# Patient Record
Sex: Female | Born: 1940 | Race: White | Hispanic: No | Smoking: Never smoker
Health system: Southern US, Community
[De-identification: ages and names within clinical notes are randomized; demographics above are authoritative.]

## PROBLEM LIST (undated history)

## (undated) DIAGNOSIS — M797 Fibromyalgia: Secondary | ICD-10-CM

## (undated) DIAGNOSIS — R220 Localized swelling, mass and lump, head: Secondary | ICD-10-CM

## (undated) DIAGNOSIS — D649 Anemia, unspecified: Secondary | ICD-10-CM

## (undated) DIAGNOSIS — M722 Plantar fascial fibromatosis: Secondary | ICD-10-CM

## (undated) DIAGNOSIS — J40 Bronchitis, not specified as acute or chronic: Secondary | ICD-10-CM

## (undated) DIAGNOSIS — T7840XA Allergy, unspecified, initial encounter: Secondary | ICD-10-CM

## (undated) DIAGNOSIS — I872 Venous insufficiency (chronic) (peripheral): Secondary | ICD-10-CM

## (undated) DIAGNOSIS — R221 Localized swelling, mass and lump, neck: Secondary | ICD-10-CM

## (undated) DIAGNOSIS — K449 Diaphragmatic hernia without obstruction or gangrene: Secondary | ICD-10-CM

## (undated) DIAGNOSIS — M79609 Pain in unspecified limb: Secondary | ICD-10-CM

## (undated) DIAGNOSIS — K219 Gastro-esophageal reflux disease without esophagitis: Secondary | ICD-10-CM

## (undated) DIAGNOSIS — G9332 Myalgic encephalomyelitis/chronic fatigue syndrome: Secondary | ICD-10-CM

## (undated) DIAGNOSIS — M899 Disorder of bone, unspecified: Secondary | ICD-10-CM

## (undated) DIAGNOSIS — R21 Rash and other nonspecific skin eruption: Secondary | ICD-10-CM

## (undated) DIAGNOSIS — M949 Disorder of cartilage, unspecified: Secondary | ICD-10-CM

## (undated) DIAGNOSIS — Z8489 Family history of other specified conditions: Secondary | ICD-10-CM

## (undated) DIAGNOSIS — L509 Urticaria, unspecified: Secondary | ICD-10-CM

## (undated) DIAGNOSIS — J309 Allergic rhinitis, unspecified: Secondary | ICD-10-CM

## (undated) DIAGNOSIS — M76899 Other specified enthesopathies of unspecified lower limb, excluding foot: Secondary | ICD-10-CM

## (undated) DIAGNOSIS — E785 Hyperlipidemia, unspecified: Secondary | ICD-10-CM

## (undated) DIAGNOSIS — G43009 Migraine without aura, not intractable, without status migrainosus: Secondary | ICD-10-CM

## (undated) DIAGNOSIS — T8859XA Other complications of anesthesia, initial encounter: Secondary | ICD-10-CM

## (undated) DIAGNOSIS — M810 Age-related osteoporosis without current pathological fracture: Secondary | ICD-10-CM

## (undated) DIAGNOSIS — G43909 Migraine, unspecified, not intractable, without status migrainosus: Secondary | ICD-10-CM

## (undated) DIAGNOSIS — E039 Hypothyroidism, unspecified: Secondary | ICD-10-CM

## (undated) DIAGNOSIS — F411 Generalized anxiety disorder: Secondary | ICD-10-CM

## (undated) DIAGNOSIS — L989 Disorder of the skin and subcutaneous tissue, unspecified: Secondary | ICD-10-CM

## (undated) DIAGNOSIS — R5382 Chronic fatigue, unspecified: Secondary | ICD-10-CM

## (undated) DIAGNOSIS — M1990 Unspecified osteoarthritis, unspecified site: Secondary | ICD-10-CM

## (undated) DIAGNOSIS — J019 Acute sinusitis, unspecified: Secondary | ICD-10-CM

## (undated) DIAGNOSIS — N760 Acute vaginitis: Secondary | ICD-10-CM

## (undated) DIAGNOSIS — K589 Irritable bowel syndrome without diarrhea: Secondary | ICD-10-CM

## (undated) DIAGNOSIS — F3289 Other specified depressive episodes: Secondary | ICD-10-CM

## (undated) DIAGNOSIS — T4145XA Adverse effect of unspecified anesthetic, initial encounter: Secondary | ICD-10-CM

## (undated) DIAGNOSIS — F329 Major depressive disorder, single episode, unspecified: Secondary | ICD-10-CM

## (undated) HISTORY — DX: Myalgic encephalomyelitis/chronic fatigue syndrome: G93.32

## (undated) HISTORY — DX: Allergic rhinitis, unspecified: J30.9

## (undated) HISTORY — DX: Irritable bowel syndrome, unspecified: K58.9

## (undated) HISTORY — PX: VAGINAL HYSTERECTOMY: SUR661

## (undated) HISTORY — DX: Diaphragmatic hernia without obstruction or gangrene: K44.9

## (undated) HISTORY — DX: Other specified depressive episodes: F32.89

## (undated) HISTORY — DX: Fibromyalgia: M79.7

## (undated) HISTORY — PX: BREAST ENHANCEMENT SURGERY: SHX7

## (undated) HISTORY — DX: Plantar fascial fibromatosis: M72.2

## (undated) HISTORY — DX: Migraine without aura, not intractable, without status migrainosus: G43.009

## (undated) HISTORY — PX: APPENDECTOMY: SHX54

## (undated) HISTORY — DX: Allergy, unspecified, initial encounter: T78.40XA

## (undated) HISTORY — DX: Urticaria, unspecified: L50.9

## (undated) HISTORY — PX: SHOULDER SURGERY: SHX246

## (undated) HISTORY — DX: Age-related osteoporosis without current pathological fracture: M81.0

## (undated) HISTORY — DX: Hyperlipidemia, unspecified: E78.5

## (undated) HISTORY — DX: Venous insufficiency (chronic) (peripheral): I87.2

## (undated) HISTORY — DX: Localized swelling, mass and lump, head: R22.1

## (undated) HISTORY — PX: CHOLECYSTECTOMY: SHX55

## (undated) HISTORY — DX: Pain in unspecified limb: M79.609

## (undated) HISTORY — PX: OOPHORECTOMY: SHX86

## (undated) HISTORY — DX: Disorder of the skin and subcutaneous tissue, unspecified: L98.9

## (undated) HISTORY — DX: Acute sinusitis, unspecified: J01.90

## (undated) HISTORY — PX: COLONOSCOPY: SHX174

## (undated) HISTORY — DX: Gastro-esophageal reflux disease without esophagitis: K21.9

## (undated) HISTORY — PX: BREAST IMPLANT REMOVAL: SUR1101

## (undated) HISTORY — DX: Disorder of bone, unspecified: M89.9

## (undated) HISTORY — PX: OTHER SURGICAL HISTORY: SHX169

## (undated) HISTORY — DX: Localized swelling, mass and lump, head: R22.0

## (undated) HISTORY — DX: Unspecified osteoarthritis, unspecified site: M19.90

## (undated) HISTORY — DX: Generalized anxiety disorder: F41.1

## (undated) HISTORY — DX: Acute vaginitis: N76.0

## (undated) HISTORY — PX: MASTECTOMY: SHX3

## (undated) HISTORY — DX: Disorder of cartilage, unspecified: M94.9

## (undated) HISTORY — DX: Other specified enthesopathies of unspecified lower limb, excluding foot: M76.899

## (undated) HISTORY — DX: Migraine, unspecified, not intractable, without status migrainosus: G43.909

## (undated) HISTORY — PX: TONSILLECTOMY: SUR1361

## (undated) HISTORY — DX: Major depressive disorder, single episode, unspecified: F32.9

## (undated) HISTORY — DX: Chronic fatigue, unspecified: R53.82

## (undated) HISTORY — DX: Rash and other nonspecific skin eruption: R21

---

## 1998-03-18 ENCOUNTER — Other Ambulatory Visit: Admission: RE | Admit: 1998-03-18 | Discharge: 1998-04-12 | Payer: Self-pay

## 1998-06-14 ENCOUNTER — Other Ambulatory Visit: Admission: RE | Admit: 1998-06-14 | Discharge: 1998-06-14 | Payer: Self-pay | Admitting: Gastroenterology

## 1998-07-02 ENCOUNTER — Encounter: Payer: Self-pay | Admitting: Gastroenterology

## 1998-07-02 ENCOUNTER — Ambulatory Visit (HOSPITAL_COMMUNITY): Admission: RE | Admit: 1998-07-02 | Discharge: 1998-07-02 | Payer: Self-pay | Admitting: Gastroenterology

## 1998-09-27 ENCOUNTER — Encounter: Admission: RE | Admit: 1998-09-27 | Discharge: 1998-11-08 | Payer: Self-pay | Admitting: Orthopedic Surgery

## 1998-11-21 ENCOUNTER — Encounter: Admission: RE | Admit: 1998-11-21 | Discharge: 1998-12-06 | Payer: Self-pay | Admitting: Orthopaedic Surgery

## 1999-04-01 ENCOUNTER — Other Ambulatory Visit: Admission: RE | Admit: 1999-04-01 | Discharge: 1999-04-01 | Payer: Self-pay | Admitting: Family Medicine

## 2000-07-04 ENCOUNTER — Encounter (INDEPENDENT_AMBULATORY_CARE_PROVIDER_SITE_OTHER): Payer: Self-pay

## 2000-07-04 ENCOUNTER — Encounter: Payer: Self-pay | Admitting: General Surgery

## 2000-07-04 ENCOUNTER — Observation Stay (HOSPITAL_COMMUNITY): Admission: EM | Admit: 2000-07-04 | Discharge: 2000-07-05 | Payer: Self-pay | Admitting: Emergency Medicine

## 2000-09-15 ENCOUNTER — Other Ambulatory Visit: Admission: RE | Admit: 2000-09-15 | Discharge: 2000-09-15 | Payer: Self-pay | Admitting: Family Medicine

## 2002-06-16 ENCOUNTER — Encounter: Payer: Self-pay | Admitting: Emergency Medicine

## 2002-06-16 ENCOUNTER — Emergency Department (HOSPITAL_COMMUNITY): Admission: EM | Admit: 2002-06-16 | Discharge: 2002-06-17 | Payer: Self-pay | Admitting: Emergency Medicine

## 2002-06-17 ENCOUNTER — Encounter: Payer: Self-pay | Admitting: Orthopedic Surgery

## 2003-02-23 LAB — HM COLONOSCOPY

## 2003-08-28 ENCOUNTER — Ambulatory Visit (HOSPITAL_COMMUNITY): Admission: RE | Admit: 2003-08-28 | Discharge: 2003-08-28 | Payer: Self-pay | Admitting: Internal Medicine

## 2004-03-06 ENCOUNTER — Encounter: Admission: RE | Admit: 2004-03-06 | Discharge: 2004-03-06 | Payer: Self-pay | Admitting: Internal Medicine

## 2004-08-15 ENCOUNTER — Ambulatory Visit: Payer: Self-pay | Admitting: Gastroenterology

## 2004-08-19 ENCOUNTER — Ambulatory Visit: Payer: Self-pay | Admitting: Gastroenterology

## 2004-08-20 ENCOUNTER — Ambulatory Visit: Payer: Self-pay | Admitting: Gastroenterology

## 2004-08-21 ENCOUNTER — Encounter: Admission: RE | Admit: 2004-08-21 | Discharge: 2004-08-21 | Payer: Self-pay | Admitting: Gastroenterology

## 2005-01-06 ENCOUNTER — Ambulatory Visit: Payer: Self-pay | Admitting: Internal Medicine

## 2005-04-16 ENCOUNTER — Ambulatory Visit: Payer: Self-pay | Admitting: Internal Medicine

## 2005-04-29 ENCOUNTER — Ambulatory Visit: Payer: Self-pay | Admitting: Internal Medicine

## 2005-04-30 ENCOUNTER — Ambulatory Visit: Payer: Self-pay

## 2005-07-16 ENCOUNTER — Ambulatory Visit: Payer: Self-pay | Admitting: Internal Medicine

## 2005-12-26 ENCOUNTER — Ambulatory Visit: Payer: Self-pay | Admitting: Internal Medicine

## 2006-01-20 ENCOUNTER — Ambulatory Visit: Payer: Self-pay | Admitting: Internal Medicine

## 2006-01-29 ENCOUNTER — Ambulatory Visit: Payer: Self-pay | Admitting: Family Medicine

## 2006-04-28 ENCOUNTER — Ambulatory Visit: Payer: Self-pay | Admitting: Internal Medicine

## 2006-05-07 ENCOUNTER — Ambulatory Visit: Payer: Self-pay | Admitting: Internal Medicine

## 2006-08-05 ENCOUNTER — Ambulatory Visit: Payer: Self-pay | Admitting: Gastroenterology

## 2006-10-12 ENCOUNTER — Ambulatory Visit: Payer: Self-pay | Admitting: Internal Medicine

## 2006-11-16 ENCOUNTER — Ambulatory Visit: Payer: Self-pay | Admitting: Internal Medicine

## 2007-01-23 DIAGNOSIS — E785 Hyperlipidemia, unspecified: Secondary | ICD-10-CM | POA: Insufficient documentation

## 2007-01-23 DIAGNOSIS — K589 Irritable bowel syndrome without diarrhea: Secondary | ICD-10-CM | POA: Insufficient documentation

## 2007-01-23 DIAGNOSIS — J310 Chronic rhinitis: Secondary | ICD-10-CM | POA: Insufficient documentation

## 2007-01-23 DIAGNOSIS — K219 Gastro-esophageal reflux disease without esophagitis: Secondary | ICD-10-CM | POA: Insufficient documentation

## 2007-01-23 DIAGNOSIS — F329 Major depressive disorder, single episode, unspecified: Secondary | ICD-10-CM

## 2007-01-23 DIAGNOSIS — F419 Anxiety disorder, unspecified: Secondary | ICD-10-CM | POA: Insufficient documentation

## 2007-01-23 DIAGNOSIS — G43909 Migraine, unspecified, not intractable, without status migrainosus: Secondary | ICD-10-CM | POA: Insufficient documentation

## 2007-01-23 DIAGNOSIS — F32A Depression, unspecified: Secondary | ICD-10-CM | POA: Insufficient documentation

## 2007-01-23 DIAGNOSIS — M722 Plantar fascial fibromatosis: Secondary | ICD-10-CM | POA: Insufficient documentation

## 2007-01-25 ENCOUNTER — Ambulatory Visit: Payer: Self-pay | Admitting: Internal Medicine

## 2007-01-25 LAB — CONVERTED CEMR LAB
ALT: 24 units/L (ref 0–35)
AST: 28 units/L (ref 0–37)
Albumin: 3.9 g/dL (ref 3.5–5.2)
Alkaline Phosphatase: 47 units/L (ref 39–117)
BUN: 10 mg/dL (ref 6–23)
Basophils Absolute: 0 10*3/uL (ref 0.0–0.1)
Basophils Relative: 0.5 % (ref 0.0–1.0)
Bilirubin Urine: NEGATIVE
Bilirubin, Direct: 0.1 mg/dL (ref 0.0–0.3)
CO2: 33 meq/L — ABNORMAL HIGH (ref 19–32)
Calcium: 9.4 mg/dL (ref 8.4–10.5)
Chloride: 100 meq/L (ref 96–112)
Cholesterol: 187 mg/dL (ref 0–200)
Creatinine, Ser: 0.8 mg/dL (ref 0.4–1.2)
Eosinophils Absolute: 0.1 10*3/uL (ref 0.0–0.6)
Eosinophils Relative: 1.2 % (ref 0.0–5.0)
GFR calc Af Amer: 92 mL/min
GFR calc non Af Amer: 76 mL/min
Glucose, Bld: 102 mg/dL — ABNORMAL HIGH (ref 70–99)
HCT: 39.5 % (ref 36.0–46.0)
HDL: 75.7 mg/dL (ref >39.0)
Hemoglobin, Urine: NEGATIVE
Hemoglobin: 13.5 g/dL (ref 12.0–15.0)
Ketones, ur: NEGATIVE mg/dL
LDL Cholesterol: 81 mg/dL (ref 0–99)
Leukocytes, UA: NEGATIVE
Lymphocytes Relative: 27.2 % (ref 12.0–46.0)
MCHC: 34.1 g/dL (ref 30.0–36.0)
MCV: 94.9 fL (ref 78.0–100.0)
Monocytes Absolute: 0.9 10*3/uL — ABNORMAL HIGH (ref 0.2–0.7)
Monocytes Relative: 11 % (ref 3.0–11.0)
Neutro Abs: 5 10*3/uL (ref 1.4–7.7)
Neutrophils Relative %: 60.1 % (ref 43.0–77.0)
Nitrite: NEGATIVE
Platelets: 259 10*3/uL (ref 150–400)
Potassium: 3.7 meq/L (ref 3.5–5.1)
RBC: 4.16 M/uL (ref 3.87–5.11)
RDW: 12.1 % (ref 11.5–14.6)
Sodium: 141 meq/L (ref 135–145)
Specific Gravity, Urine: 1.01 (ref 1.000–1.03)
TSH: 1.59 microintl units/mL (ref 0.35–5.50)
Total Bilirubin: 0.6 mg/dL (ref 0.3–1.2)
Total CHOL/HDL Ratio: 2.5
Total Protein, Urine: NEGATIVE mg/dL
Total Protein: 7.4 g/dL (ref 6.0–8.3)
Triglycerides: 152 mg/dL — ABNORMAL HIGH (ref 0–149)
Urine Glucose: NEGATIVE mg/dL
Urobilinogen, UA: 0.2 (ref 0.0–1.0)
VLDL: 30 mg/dL (ref 0–40)
WBC: 8.3 10*3/uL (ref 4.5–10.5)
pH: 6 (ref 5.0–8.0)

## 2007-07-19 ENCOUNTER — Ambulatory Visit: Payer: Self-pay | Admitting: Internal Medicine

## 2007-07-19 DIAGNOSIS — M810 Age-related osteoporosis without current pathological fracture: Secondary | ICD-10-CM | POA: Insufficient documentation

## 2007-07-19 DIAGNOSIS — M76899 Other specified enthesopathies of unspecified lower limb, excluding foot: Secondary | ICD-10-CM | POA: Insufficient documentation

## 2007-07-19 DIAGNOSIS — J019 Acute sinusitis, unspecified: Secondary | ICD-10-CM | POA: Insufficient documentation

## 2007-08-17 ENCOUNTER — Encounter: Payer: Self-pay | Admitting: Internal Medicine

## 2008-03-20 ENCOUNTER — Ambulatory Visit: Payer: Self-pay | Admitting: Internal Medicine

## 2008-03-20 DIAGNOSIS — N760 Acute vaginitis: Secondary | ICD-10-CM | POA: Insufficient documentation

## 2008-03-20 DIAGNOSIS — R21 Rash and other nonspecific skin eruption: Secondary | ICD-10-CM | POA: Insufficient documentation

## 2008-03-20 LAB — CONVERTED CEMR LAB
ALT: 22 units/L (ref 0–35)
AST: 23 units/L (ref 0–37)
Albumin: 3.6 g/dL (ref 3.5–5.2)
Alkaline Phosphatase: 37 units/L — ABNORMAL LOW (ref 39–117)
BUN: 7 mg/dL (ref 6–23)
Basophils Absolute: 0 10*3/uL (ref 0.0–0.1)
Basophils Relative: 0.2 % (ref 0.0–3.0)
Bilirubin Urine: NEGATIVE
Bilirubin, Direct: 0.1 mg/dL (ref 0.0–0.3)
CO2: 30 meq/L (ref 19–32)
Calcium: 9 mg/dL (ref 8.4–10.5)
Chloride: 105 meq/L (ref 96–112)
Cholesterol: 194 mg/dL (ref 0–200)
Creatinine, Ser: 0.8 mg/dL (ref 0.4–1.2)
Eosinophils Absolute: 0.2 10*3/uL (ref 0.0–0.7)
Eosinophils Relative: 1.6 % (ref 0.0–5.0)
GFR calc Af Amer: 92 mL/min
GFR calc non Af Amer: 76 mL/min
Glucose, Bld: 98 mg/dL (ref 70–99)
HCT: 36.3 % (ref 36.0–46.0)
HDL: 81.8 mg/dL (ref >39.0)
Hemoglobin, Urine: NEGATIVE
Hemoglobin: 13 g/dL (ref 12.0–15.0)
Ketones, ur: NEGATIVE mg/dL
LDL Cholesterol: 83 mg/dL (ref 0–99)
Leukocytes, UA: NEGATIVE
Lymphocytes Relative: 24.4 % (ref 12.0–46.0)
MCHC: 35.8 g/dL (ref 30.0–36.0)
MCV: 95 fL (ref 78.0–100.0)
Monocytes Absolute: 0.8 10*3/uL (ref 0.1–1.0)
Monocytes Relative: 8.8 % (ref 3.0–12.0)
Neutro Abs: 6.3 10*3/uL (ref 1.4–7.7)
Neutrophils Relative %: 65 % (ref 43.0–77.0)
Nitrite: NEGATIVE
Platelets: 210 10*3/uL (ref 150–400)
Potassium: 3.9 meq/L (ref 3.5–5.1)
RBC: 3.82 M/uL — ABNORMAL LOW (ref 3.87–5.11)
RDW: 12.2 % (ref 11.5–14.6)
Sodium: 141 meq/L (ref 135–145)
Specific Gravity, Urine: 1.015 (ref 1.000–1.03)
TSH: 3.71 microintl units/mL (ref 0.35–5.50)
Total Bilirubin: 0.6 mg/dL (ref 0.3–1.2)
Total CHOL/HDL Ratio: 2.4
Total Protein, Urine: NEGATIVE mg/dL
Total Protein: 7.2 g/dL (ref 6.0–8.3)
Triglycerides: 148 mg/dL (ref 0–149)
Urine Glucose: NEGATIVE mg/dL
Urobilinogen, UA: 0.2 (ref 0.0–1.0)
VLDL: 30 mg/dL (ref 0–40)
WBC: 9.6 10*3/uL (ref 4.5–10.5)
pH: 6 (ref 5.0–8.0)

## 2008-03-20 LAB — HM MAMMOGRAPHY

## 2008-03-21 LAB — CONVERTED CEMR LAB: Vit D, 1,25-Dihydroxy: 45 (ref 30–89)

## 2008-04-02 ENCOUNTER — Ambulatory Visit: Payer: Self-pay | Admitting: Internal Medicine

## 2008-06-18 ENCOUNTER — Telehealth (INDEPENDENT_AMBULATORY_CARE_PROVIDER_SITE_OTHER): Payer: Self-pay | Admitting: *Deleted

## 2008-09-17 ENCOUNTER — Ambulatory Visit: Payer: Self-pay | Admitting: Internal Medicine

## 2009-03-20 ENCOUNTER — Telehealth: Payer: Self-pay | Admitting: Internal Medicine

## 2009-04-01 ENCOUNTER — Ambulatory Visit: Payer: Self-pay | Admitting: Internal Medicine

## 2009-04-01 DIAGNOSIS — R220 Localized swelling, mass and lump, head: Secondary | ICD-10-CM | POA: Insufficient documentation

## 2009-04-01 DIAGNOSIS — R221 Localized swelling, mass and lump, neck: Secondary | ICD-10-CM

## 2009-04-02 LAB — CONVERTED CEMR LAB: Vit D, 25-Hydroxy: 37 ng/mL (ref 30–89)

## 2009-04-03 LAB — CONVERTED CEMR LAB
ALT: 23 units/L (ref 0–35)
AST: 26 units/L (ref 0–37)
Albumin: 3.7 g/dL (ref 3.5–5.2)
Alkaline Phosphatase: 44 units/L (ref 39–117)
BUN: 8 mg/dL (ref 6–23)
Basophils Absolute: 0 10*3/uL (ref 0.0–0.1)
Basophils Relative: 0.1 % (ref 0.0–3.0)
Bilirubin Urine: NEGATIVE
Bilirubin, Direct: 0.1 mg/dL (ref 0.0–0.3)
CO2: 29 meq/L (ref 19–32)
Calcium: 8.6 mg/dL (ref 8.4–10.5)
Chloride: 105 meq/L (ref 96–112)
Cholesterol: 200 mg/dL (ref 0–200)
Creatinine, Ser: 0.8 mg/dL (ref 0.4–1.2)
Eosinophils Absolute: 0.2 10*3/uL (ref 0.0–0.7)
Eosinophils Relative: 1.9 % (ref 0.0–5.0)
GFR calc non Af Amer: 75.76 mL/min (ref >60)
Glucose, Bld: 99 mg/dL (ref 70–99)
HCT: 40.4 % (ref 36.0–46.0)
HDL: 82 mg/dL (ref >39.00)
Hemoglobin, Urine: NEGATIVE
Hemoglobin: 14 g/dL (ref 12.0–15.0)
Ketones, ur: NEGATIVE mg/dL
LDL Cholesterol: 99 mg/dL (ref 0–99)
Leukocytes, UA: NEGATIVE
Lymphocytes Relative: 23.1 % (ref 12.0–46.0)
Lymphs Abs: 2.1 10*3/uL (ref 0.7–4.0)
MCHC: 34.6 g/dL (ref 30.0–36.0)
MCV: 98.8 fL (ref 78.0–100.0)
Monocytes Absolute: 0.9 10*3/uL (ref 0.1–1.0)
Monocytes Relative: 10.2 % (ref 3.0–12.0)
Neutro Abs: 5.7 10*3/uL (ref 1.4–7.7)
Neutrophils Relative %: 64.7 % (ref 43.0–77.0)
Nitrite: NEGATIVE
Platelets: 223 10*3/uL (ref 150.0–400.0)
Potassium: 4.4 meq/L (ref 3.5–5.1)
RBC: 4.09 M/uL (ref 3.87–5.11)
RDW: 12.6 % (ref 11.5–14.6)
Sodium: 141 meq/L (ref 135–145)
Specific Gravity, Urine: 1.02 (ref 1.000–1.030)
TSH: 2.34 microintl units/mL (ref 0.35–5.50)
Total Bilirubin: 0.6 mg/dL (ref 0.3–1.2)
Total CHOL/HDL Ratio: 2
Total Protein, Urine: NEGATIVE mg/dL
Total Protein: 7.5 g/dL (ref 6.0–8.3)
Triglycerides: 97 mg/dL (ref 0.0–149.0)
Urine Glucose: NEGATIVE mg/dL
Urobilinogen, UA: 0.2 (ref 0.0–1.0)
VLDL: 19.4 mg/dL (ref 0.0–40.0)
WBC: 8.9 10*3/uL (ref 4.5–10.5)
pH: 5.5 (ref 5.0–8.0)

## 2009-07-30 ENCOUNTER — Telehealth: Payer: Self-pay | Admitting: Internal Medicine

## 2009-09-30 ENCOUNTER — Telehealth: Payer: Self-pay | Admitting: Internal Medicine

## 2010-02-12 ENCOUNTER — Encounter: Payer: Self-pay | Admitting: Internal Medicine

## 2010-04-02 ENCOUNTER — Encounter: Payer: Self-pay | Admitting: Internal Medicine

## 2010-04-02 ENCOUNTER — Ambulatory Visit: Payer: Self-pay | Admitting: Internal Medicine

## 2010-04-02 DIAGNOSIS — M949 Disorder of cartilage, unspecified: Secondary | ICD-10-CM

## 2010-04-02 DIAGNOSIS — G43009 Migraine without aura, not intractable, without status migrainosus: Secondary | ICD-10-CM | POA: Insufficient documentation

## 2010-04-02 DIAGNOSIS — I872 Venous insufficiency (chronic) (peripheral): Secondary | ICD-10-CM | POA: Insufficient documentation

## 2010-04-02 DIAGNOSIS — M899 Disorder of bone, unspecified: Secondary | ICD-10-CM | POA: Insufficient documentation

## 2010-04-02 DIAGNOSIS — M79609 Pain in unspecified limb: Secondary | ICD-10-CM | POA: Insufficient documentation

## 2010-04-02 DIAGNOSIS — L989 Disorder of the skin and subcutaneous tissue, unspecified: Secondary | ICD-10-CM | POA: Insufficient documentation

## 2010-04-02 LAB — CONVERTED CEMR LAB
ALT: 20 units/L (ref 0–35)
AST: 25 units/L (ref 0–37)
Albumin: 3.8 g/dL (ref 3.5–5.2)
Alkaline Phosphatase: 45 units/L (ref 39–117)
BUN: 11 mg/dL (ref 6–23)
Basophils Absolute: 0 10*3/uL (ref 0.0–0.1)
Basophils Relative: 0.3 % (ref 0.0–3.0)
Bilirubin Urine: NEGATIVE
Bilirubin, Direct: 0 mg/dL (ref 0.0–0.3)
Blood, UA: NEGATIVE
CO2: 30 meq/L (ref 19–32)
Calcium: 9.5 mg/dL (ref 8.4–10.5)
Chloride: 105 meq/L (ref 96–112)
Cholesterol: 200 mg/dL (ref 0–200)
Creatinine, Ser: 0.9 mg/dL (ref 0.4–1.2)
Eosinophils Absolute: 0.2 10*3/uL (ref 0.0–0.7)
Eosinophils Relative: 3 % (ref 0.0–5.0)
GFR calc non Af Amer: 69.48 mL/min (ref >60)
Glucose, Bld: 87 mg/dL (ref 70–99)
HCT: 39.2 % (ref 36.0–46.0)
HDL: 79.2 mg/dL (ref >39.00)
Hemoglobin: 13.5 g/dL (ref 12.0–15.0)
Ketones, ur: NEGATIVE mg/dL
LDL Cholesterol: 101 mg/dL — ABNORMAL HIGH (ref 0–99)
Leukocytes, UA: NEGATIVE
Lymphocytes Relative: 33 % (ref 12.0–46.0)
Lymphs Abs: 2.7 10*3/uL (ref 0.7–4.0)
MCHC: 34.4 g/dL (ref 30.0–36.0)
MCV: 96.2 fL (ref 78.0–100.0)
Monocytes Absolute: 1 10*3/uL (ref 0.1–1.0)
Monocytes Relative: 12.2 % — ABNORMAL HIGH (ref 3.0–12.0)
Neutro Abs: 4.3 10*3/uL (ref 1.4–7.7)
Neutrophils Relative %: 51.5 % (ref 43.0–77.0)
Nitrite: NEGATIVE
Platelets: 257 10*3/uL (ref 150.0–400.0)
Potassium: 4.3 meq/L (ref 3.5–5.1)
RBC: 4.07 M/uL (ref 3.87–5.11)
RDW: 13.3 % (ref 11.5–14.6)
Sodium: 141 meq/L (ref 135–145)
Specific Gravity, Urine: 1.01 (ref 1.000–1.030)
TSH: 2.03 microintl units/mL (ref 0.35–5.50)
Total Bilirubin: 0.4 mg/dL (ref 0.3–1.2)
Total CHOL/HDL Ratio: 3
Total Protein, Urine: NEGATIVE mg/dL
Total Protein: 7.3 g/dL (ref 6.0–8.3)
Triglycerides: 97 mg/dL (ref 0.0–149.0)
Urine Glucose: NEGATIVE mg/dL
Urobilinogen, UA: 0.2 (ref 0.0–1.0)
VLDL: 19.4 mg/dL (ref 0.0–40.0)
WBC: 8.3 10*3/uL (ref 4.5–10.5)
pH: 5.5 (ref 5.0–8.0)

## 2010-04-08 ENCOUNTER — Ambulatory Visit: Payer: Self-pay | Admitting: Internal Medicine

## 2010-04-14 ENCOUNTER — Encounter: Admission: RE | Admit: 2010-04-14 | Discharge: 2010-04-14 | Payer: Self-pay | Admitting: Orthopedic Surgery

## 2010-04-17 ENCOUNTER — Encounter: Payer: Self-pay | Admitting: Internal Medicine

## 2010-04-22 ENCOUNTER — Encounter: Payer: Self-pay | Admitting: Internal Medicine

## 2010-05-13 ENCOUNTER — Encounter: Payer: Self-pay | Admitting: Internal Medicine

## 2010-07-03 NOTE — Letter (Signed)
Summary: [facility]   Imported By: Lennie Odor 04/30/2010 15:27:15  _____________________________________________________________________  External Attachment:    Type:   Image     Comment:   External Document

## 2010-07-03 NOTE — Consult Note (Signed)
Summary: [facility]   Imported By: Sherian Rein 05/19/2010 11:48:16  _____________________________________________________________________  External Attachment:    Type:   Image     Comment:   External Document

## 2010-07-03 NOTE — Assessment & Plan Note (Signed)
Summary: YEARLY---STC   Vital Signs:  Patient profile:   70 year old female Height:      61 inches Weight:      150.38 pounds BMI:     28.52 O2 Sat:      98 % on Room air Temp:     97.7 degrees F oral Pulse rate:   79 / minute BP sitting:   132 / 82  (left arm) Cuff size:   regular  Vitals Entered By: Zella Ball Ewing CMA Duncan Dull) (April 02, 2010 10:41 AM)  O2 Flow:  Room air  CC: Yearly/RE   CC:  Yearly/RE.  History of Present Illness: here for wellness and f/u with multiple concerns; c/o 2-3 days ST with low grade fever, headache, general malaise adn weakness, slight cough but Pt denies CP, worsening sob, doe, wheezing, orthopnea, pnd, worsening LE edema, palps, dizziness or syncope   c/o over 4 wks moderate to severe left lateral hip and leg pain - worse to lay on the left side and hurts to walk, no falls or other injury  has occasional lower back achy pain off and on for over a yr, without LE pain/weak/numb, gait change, fever, wt change, bowel or bladder change, worse to get up from chair.   Pt denies new neuro symptoms such as headache, facial or extremity weakness   c/o 1 yr increased low mid abd disomfort new this yr but seems to occur concomitant with taking the torsemide, and cont's to have mild persistent venous type LE insuff and edema  requests lansoprazole change to protonix due to insurance only;  no dysphagia, n/v, abd pain, wt loss or blood.  hydrocodone makes her itch - no longer wants to take this, no rash, tongue swellling or sob/wheezing  has skin lesion to right ant thigh incresaed in size in the past yr - need derm referral  due for flu shot today; No fever, wt loss, night sweats, loss of appetite or other constitutional symptoms  Denies worsening depressive symptoms, suicidal ideation, or panic.  Pt denies polydipsia, polyuria.  Overall good compliance with meds, trying to follow low chol diet, wt stable, little excercise however  Pt states good ability  with ADL's, low fall risk, home safety reviewed and adequate, no significant change in hearing or vision, trying to follow lower chol diet, and occasionally active only with regular excercise.   Preventive Screening-Counseling & Management      Drug Use:  no.    Problems Prior to Update: 1)  Pharyngitis-acute  (ICD-462) 2)  Venous Insufficiency, Chronic  (ICD-459.81) 3)  Migraine, Common  (ICD-346.10) 4)  Bursitis, Left Hip  (ICD-726.5) 5)  Skin Lesion  (ICD-709.9) 6)  Osteopenia  (ICD-733.90) 7)  Foot Pain, Bilateral  (ICD-729.5) 8)  Neck Mass  (ICD-784.2) 9)  Sinusitis- Acute-nos  (ICD-461.9) 10)  Rash-nonvesicular  (ICD-782.1) 11)  Preventive Health Care  (ICD-V70.0) 12)  Vaginitis  (ICD-616.10) 13)  Bursitis, Right Hip  (ICD-726.5) 14)  Sinusitis- Acute-nos  (ICD-461.9) 15)  Osteoporosis  (ICD-733.00) 16)  Plantar Fasciitis  (ICD-728.71) 17)  Hyperlipidemia  (ICD-272.4) 18)  Depression  (ICD-311) 19)  Anxiety  (ICD-300.00) 20)  Allergic Rhinitis  (ICD-477.9) 21)  Gerd  (ICD-530.81) 22)  Migraine Headache  (ICD-346.90) 23)  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 Take 1 Tablet By Mouth Twice A Day As Needed - To Fill Sep 30, 2009 2)  Torsemide 20 Mg  Tabs (Torsemide) .Marland KitchenMarland KitchenMarland Kitchen  1 Tab Once Daily 3)  Estradiol 2 Mg  Tabs (Estradiol) .Marland Kitchen.. 1 By Mouth Once Daily 4)  Allegra 180 Mg  Tabs (Fexofenadine Hcl) .Marland Kitchen.. 1 By Mouth Once Daily 5)  Reglan 10 Mg  Tabs (Metoclopramide Hcl) .Marland Kitchen.. 1 By Mouth Three Times A Day 6)  Fosamax 70 Mg  Tabs (Alendronate Sodium) .Marland Kitchen.. 1 By Mouth Qwk 7)  Lansoprazole 30 Mg Cpdr (Lansoprazole) .Marland Kitchen.. 1 By Mouth Once Daily 8)  Wellbutrin Xl 300 Mg Xr24h-Tab (Bupropion Hcl) .Marland Kitchen.. 1 By Mouth Once Daily 9)  Sertraline Hcl 50 Mg Tabs (Sertraline Hcl) .Marland Kitchen.. 1 By Mouth Once Daily 10)  Hydrocodone-Acetaminophen 7.5-325 Mg Tabs (Hydrocodone-Acetaminophen) .Marland Kitchen.. 1po Once Daily As Needed 11)  Triamcinolone Acetonide 0.5 % Crea  (Triamcinolone Acetonide) .... Use Asd Two Times A Day As Needed 12)  Azithromycin 250 Mg Tabs (Azithromycin) .... 2po Qd For 1 Day, Then 1po Qd For 4days, Then Stop  Current Medications (verified): 1)  Alprazolam 0.5 Mg Tabs (Alprazolam) .... Take 1 Tablet By Mouth Twice A Day As Needed 2)  Estradiol 2 Mg  Tabs (Estradiol) .Marland Kitchen.. 1 By Mouth Once Daily 3)  Allegra 180 Mg  Tabs (Fexofenadine Hcl) .Marland Kitchen.. 1 By Mouth Once Daily - Generic 4)  Reglan 10 Mg  Tabs (Metoclopramide Hcl) .Marland Kitchen.. 1 By Mouth Three Times A Day As Needed 5)  Fosamax 70 Mg  Tabs (Alendronate Sodium) .Marland Kitchen.. 1 By Mouth Qwk 6)  Protonix 40 Mg Tbec (Pantoprazole Sodium) .Marland Kitchen.. 1 By Mouth Once Daily 7)  Wellbutrin Xl 300 Mg Xr24h-Tab (Bupropion Hcl) .Marland Kitchen.. 1 By Mouth Once Daily - Generic 8)  Sertraline Hcl 50 Mg Tabs (Sertraline Hcl) .Marland Kitchen.. 1 By Mouth Once Daily 9)  Triamcinolone Acetonide 0.5 % Crea (Triamcinolone Acetonide) .... Use Asd Two Times A Day As Needed 10)  Fioricet 50-325-40 Mg Tabs (Butalbital-Apap-Caffeine) .Marland Kitchen.. 1 By Mouth 3 Times Per Wk As Needed For Headache 11)  Maxzide-25 37.5-25 Mg Tabs (Triamterene-Hctz) .... Generic - 1/2 - 1 By Mouth Once Daily As Needed For Swelling 12)  Azithromycin 250 Mg Tabs (Azithromycin) .... 2po Qd For 1 Day, Then 1po Qd For 4days, Then Stop  Allergies (verified): 1)  ! Asa 2)  ! Vioxx 3)  ! Erythromycin 4)  ! * D. H. E. 5)  ! Prednisone 6)  ! Sulfa 7)  ! Imitrex 8)  ! * Lorcet 9)  ! Augmentin 10)  ! Zoloft 11)  ! * Remeron 12)  ! Doxycycline 13)  ! * Ritalin 14)  ! 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 Never Smoked Alcohol use-no Drug use-no  Risk Factors: Smoking Status: never (03/20/2008)  Past Medical History: IBS Migraines GERD Allergic  rhinitis Anxiety Depression Hyperlipidemia Plantar Fascitis Osteoporosis GI dysmotility d/o with gastroparesis/chronic constipation chronic headaches - on as needed hydrocodone Osteopenia venous insufficiency/chronic edema  Past Surgical History: Cholecystectomy Appendectomy Hysterectomy Mastectomy- bilateral for severe bilateral fibrocystic disease Oophorectomy R Shoulder Surgery S/P neck lump removal  Family History: Reviewed history from 03/20/2008 and no changes required. DM - sister and mother, and mult relative on father side heart disease grandmother - breast cancer  Social History: Reviewed history from 03/20/2008 and no changes required. 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         all otherwise negative per pt -  except also ongoing left foot pain, and did not seem better with tramadol after ortho visit, and cont's with approx 3 migraine per wk, with mutlple drug intolerances in the past - now apparently to hydrocodone as well  Physical Exam  General:  alert and overweight-appearing.   Head:  normocephalic and atraumatic.   Eyes:  vision grossly intact, pupils equal, and pupils round.   Ears:  bilat tm's red, sinus nontender bilat Nose:  no external deformity and no nasal discharge.   Mouth:  pharyngeal erythema and fair dentition.   Neck:  supple and cervical lymphadenopathy.   Lungs:  normal respiratory effort and normal breath sounds.   Heart:  normal  rate and regular rhythm.   Abdomen:  soft, non-tender, and normal bowel sounds.   Msk:  no acute joint tenderness and no joint swelling.;  has marked tender over left lateral greater trochanter;  left foot without erythema, tedner , swelling Extremities:  trace bilat edema, no erythema  Neurologic:  cranial nerves II-XII intact, strength normal in all extremities, sensation intact to light touch, gait normal, and DTRs symmetrical and normal.   Skin:  color normal and no rashes.  , lesion to right ant thigh noted - for referral Psych:  dysphoric affect and moderately anxious.     Impression & Recommendations:  Problem # 1:  Preventive Health Care (ICD-V70.0) Overall doing well, age appropriate education and counseling updated, referral for preventive services and immunizations addressed, dietary counseling and smoking status adressed , most recent labs reviewed, ecg reviewed I have personally reviewed and have noted 1.The patient's medical and social history 2.Their use of alcohol, tobacco or illicit drugs 3.Their current medications and supplements 4. Functional ability including ADL's, fall risk, home safety risk, hearing & visual impairment  5.Diet and physical activities 6.Evidence for depression or mood disorders The patients weight, height, BMI  have been recorded in the chart I have made referrals, counseling and provided education to the patient based review of the above  Orders: EKG w/ Interpretation (93000) TLB-BMP (Basic Metabolic Panel-BMET) (80048-METABOL) TLB-CBC Platelet - w/Differential (85025-CBCD) TLB-Hepatic/Liver Function Pnl (80076-HEPATIC) TLB-Lipid Panel (80061-LIPID) TLB-TSH (Thyroid Stimulating Hormone) (84443-TSH) TLB-Udip ONLY (81003-UDIP)  Problem # 2:  FOOT PAIN, BILATERAL (ICD-729.5)  exam ok , left more than right, refer to Dr Windy Carina  Orders: Orthopedic Surgeon Referral (Ortho Surgeon)  Problem # 3:  OSTEOPENIA (ICD-733.90)  Her updated  medication list for this problem includes:    Fosamax 70 Mg Tabs (Alendronate sodium) .Marland Kitchen... 1 by mouth qwk due for f/u dxa next month - pt to schedule , Continue all previous medications as before this visit ,  last dxa reviewed with pt  Orders: T-Bone Densitometry ([address])  Problem # 4:  SKIN LESION (ICD-709.9)  right ant thigh - for referral to derm  Orders: Dermatology Referral (Derma)  Problem # 5:  BURSITIS, LEFT HIP (ICD-726.5) moderate, Continue all previous medications as before this visit , refer ortho Orders: Orthopedic Surgeon Referral (Ortho Surgeon)  Problem # 6:  MIGRAINE, COMMON (ICD-346.10)  The following medications were removed from the medication list:  Hydrocodone-acetaminophen 7.5-325 Mg Tabs (Hydrocodone-acetaminophen) .Marland Kitchen... 1po once daily as needed Her updated medication list for this problem includes:    Fioricet 50-325-40 Mg Tabs (Butalbital-apap-caffeine) .Marland Kitchen... 1 by mouth 3 times per wk as needed for headache treat as above, f/u any worsening signs or symptoms   Problem # 7:  PHARYNGITIS-ACUTE (ICD-462)  The following medications were removed from the medication list:    Azithromycin 250 Mg Tabs (Azithromycin) .Marland Kitchen... 2po qd for 1 day, then 1po qd for 4days, then stop Her updated medication list for this problem includes:    Azithromycin 250 Mg Tabs (Azithromycin) .Marland Kitchen... 2po qd for 1 day, then 1po qd for 4days, then stop treat as above, f/u any worsening signs or symptoms   Problem # 8:  VENOUS INSUFFICIENCY, CHRONIC (ICD-459.81) tx with maxide 1/2 once daily   Complete Medication List: 1)  Alprazolam 0.5 Mg Tabs (Alprazolam) .... Take 1 tablet by mouth twice a day as needed 2)  Estradiol 2 Mg Tabs (Estradiol) .Marland Kitchen.. 1 by mouth once daily 3)  Allegra 180 Mg Tabs (Fexofenadine hcl) .Marland Kitchen.. 1 by mouth once daily - generic 4)  Reglan 10 Mg Tabs (Metoclopramide hcl) .Marland Kitchen.. 1 by mouth three times a day as needed 5)  Fosamax 70 Mg Tabs (Alendronate sodium) .Marland Kitchen.. 1  by mouth qwk 6)  Protonix 40 Mg Tbec (Pantoprazole sodium) .Marland Kitchen.. 1 by mouth once daily 7)  Wellbutrin Xl 300 Mg Xr24h-tab (Bupropion hcl) .Marland Kitchen.. 1 by mouth once daily - generic 8)  Sertraline Hcl 50 Mg Tabs (Sertraline hcl) .Marland Kitchen.. 1 by mouth once daily 9)  Triamcinolone Acetonide 0.5 % Crea (Triamcinolone acetonide) .... Use asd two times a day as needed 10)  Fioricet 50-325-40 Mg Tabs (Butalbital-apap-caffeine) .Marland Kitchen.. 1 by mouth 3 times per wk as needed for headache 11)  Maxzide-25 37.5-25 Mg Tabs (Triamterene-hctz) .... Generic - 1/2 - 1 by mouth once daily as needed for swelling 12)  Azithromycin 250 Mg Tabs (Azithromycin) .... 2po qd for 1 day, then 1po qd for 4days, then stop  Other Orders: Admin 1st Vaccine (62130) Flu Vaccine 37yrs + (86578)  Patient Instructions: 1)  You will be contacted about the referral(s) to: Dr Lennox Laity, and Dr Eulah Pont 2)  you had the flu shot today 3)  please schedule the bone density as you leave today 4)  OK to change the lansoprazole to protonix 40 mg per day 5)  you had the flu shot today 6)  You will be contacted about the referral(s) to: dermatology 7)  stop the torsemide 8)  start the generic maxide fluid pill as needed for the swelling 9)  Continue all previous medications as before this visit  10)  Please go to the Lab in the basement for your blood and/or urine tests today 11)  Please call the number on the [facility] Card for results of your testing  12)  Please schedule a follow-up appointment in 6 months. Prescriptions: AZITHROMYCIN 250 MG TABS (AZITHROMYCIN) 2po qd for 1 day, then 1po qd for 4days, then stop  #6 x 1   Entered and Authorized by:   Corwin Levins MD   Signed by:   Corwin Levins MD on 04/02/2010   Method used:   Electronically to        [facility] (retail)       [address]       [place], [place]  [address]       Ph: [phone] or [phone]  Fax: [phone]   RxID:   [id number] TRIAMCINOLONE ACETONIDE 0.5 % CREA  (TRIAMCINOLONE ACETONIDE) use asd two times a day as needed  #1 x 2   Entered and Authorized by:   Corwin Levins MD   Signed by:   Corwin Levins MD on 04/02/2010   Method used:   Electronically to        [facility] (retail)       [address]       [place], [place]  [address]       Ph: [phone] or [phone]       Fax: [phone]   RxID:   [id number] SERTRALINE HCL 50 MG TABS (SERTRALINE HCL) 1 by mouth once daily  #90 x 3   Entered and Authorized by:   Corwin Levins MD   Signed by:   Corwin Levins MD on 04/02/2010   Method used:   Electronically to        [facility] (retail)       [address]       [place], [place]  [address]       Ph: [phone] or [phone]       Fax: [phone]   RxID:   [id number] WELLBUTRIN XL 300 MG XR24H-TAB (BUPROPION HCL) 1 by mouth once daily - generic  #90 x 3   Entered and Authorized by:   Corwin Levins MD   Signed by:   Corwin Levins MD on 04/02/2010   Method used:   Electronically to        [facility] (retail)       [address]       [place], [place]  [address]       Ph: [phone] or [phone]       Fax: [phone]   RxID:   [id number] FOSAMAX 70 MG  TABS (ALENDRONATE SODIUM) 1 by mouth qwk  #12 x 3   Entered and Authorized by:   Corwin Levins MD   Signed by:   Corwin Levins MD on 04/02/2010   Method used:   Electronically to        [facility] (retail)       [address]       [place], [place]  [address]       Ph: [phone] or [phone]       Fax: [phone]   RxID:   [id number] REGLAN 10 MG  TABS (METOCLOPRAMIDE HCL) 1 by mouth three times a day as needed  #90 x 3   Entered and Authorized by:   Corwin Levins MD   Signed by:   Corwin Levins MD on 04/02/2010   Method used:   Electronically to        [facility] (retail)       [address]       [place], [place]  [address]       Ph: [phone] or [phone]       Fax: [phone]   RxID:   [id number] ALLEGRA 180 MG   TABS (FEXOFENADINE HCL) 1 by mouth once daily - generic  #90 x 3   Entered and Authorized by:   Corwin Levins MD   Signed by:   Corwin Levins MD on 04/02/2010   Method used:   Electronically to        [facility] (retail)  [address]       [place], [place]  [address]       Ph: [phone] or [phone]       Fax: [phone]   RxID:   [id number] ESTRADIOL 2 MG  TABS (ESTRADIOL) 1 by mouth once daily  #90 x 3   Entered and Authorized by:   Corwin Levins MD   Signed by:   Corwin Levins MD on 04/02/2010   Method used:   Electronically to        [facility] (retail)       [address]       [place], [place]  [address]       Ph: [phone] or [phone]       Fax: [phone]   RxID:   [id number] ALPRAZOLAM 0.5 MG TABS (ALPRAZOLAM) Take 1 tablet by mouth twice a day as needed  #60 x 5   Entered and Authorized by:   Corwin Levins MD   Signed by:   Corwin Levins MD on 04/02/2010   Method used:   Print then Give to Patient   RxID:   [id number] MAXZIDE-25 37.5-25 MG TABS (TRIAMTERENE-HCTZ) generic - 1/2 - 1 by mouth once daily as needed for swelling  #90 x 3   Entered and Authorized by:   Corwin Levins MD   Signed by:   Corwin Levins MD on 04/02/2010   Method used:   Electronically to        [facility] (retail)       [address]       [place], [place]  [address]       Ph: [phone] or [phone]       Fax: [phone]   RxID:   [id number] FIORICET 50-325-40 MG TABS [facility]) 1 by mouth 3 times per wk as needed for headache  #15 x 2   Entered and Authorized by:   Corwin Levins MD   Signed by:   Corwin Levins MD on 04/02/2010   Method used:   Print then Give to Patient   RxID:   [id number] PROTONIX 40 MG TBEC (PANTOPRAZOLE SODIUM) 1 by mouth once daily  #90 x 3   Entered and Authorized by:   Corwin Levins MD   Signed by:   Corwin Levins MD on 04/02/2010   Method used:   Electronically to        [facility]  (retail)       [address]       [place], [place]  [address]       Ph: [phone] or [phone]       Fax: [phone]   RxID:   [id number]    Orders Added: 1)  EKG w/ Interpretation [93000] 2)  Admin 1st Vaccine [90471] 3)  Flu Vaccine 21yrs + [419[address]] 4)  Orthopedic Surgeon Referral [Ortho Surgeon] 5)  T-Bone Densitometry [77080] 6)  Dermatology Referral [Derma] 7)  Orthopedic Surgeon Referral [Ortho Surgeon] 8)  TLB-BMP (Basic Metabolic Panel-BMET) [80048-METABOL] 9)  TLB-CBC Platelet - w/Differential [85025-CBCD] 10)  TLB-Hepatic/Liver Function Pnl [80076-HEPATIC] 11)  TLB-Lipid Panel [80061-LIPID] 12)  TLB-TSH (Thyroid Stimulating Hormone) [84443-TSH] 13)  TLB-Udip ONLY [81003-UDIP] 14)  Est. Patient 65& > [99397] 15)  Est. Patient Level IV [90240]   Flu Vaccine Consent Questions     Do you have a history of severe allergic reactions to this vaccine? no    Any prior history  of allergic reactions to egg and/or gelatin? no    Do you have a sensitivity to the preservative Thimersol? no    Do you have a past history of Guillan-Barre Syndrome? no    Do you currently have an acute febrile illness? no    Have you ever had a severe reaction to latex? no    Vaccine information given and explained to patient? yes    Are you currently pregnant? no    Lot Number:[id number]   Exp Date:11/29/2010   Site Given  Left Deltoid [id number]

## 2010-07-03 NOTE — Letter (Signed)
Summary: Generic Letter  [facility]  [address] [place], [place] [address]   Phone: [phone]  Fax: [phone]    08/17/2007  Laura Barnes [address] [place], [place]  [address]  Dear [name],        Please excuse from jury duty due to chronic  medical illness that makes her unable to fully  participate.      Sincerely,   Oliver Barre MD [facility]

## 2010-07-03 NOTE — Assessment & Plan Note (Signed)
Summary: SINUS INFECTION/NML   Vital Signs:  Patient Profile:   70 Years Old Female Weight:      137 pounds Temp:     97.2 degrees F oral Pulse rate:   90 / minute BP sitting:   136 / 88  (right arm) Cuff size:   regular  Pt. in pain?   no  Vitals Entered By: Rock Nephew CMA (July 19, 2007 3:26 PM)                  Preventive Care Screening  Bone Density:    Date:  02/03/2006    Next Due:  01/2008    Results:  abnormal std dev  Colonoscopy:    Next Due:  03/2013   Chief Complaint:  sinus congestion/sore throat/cough.  History of Present Illness: here with acute sinus pain, pressure, fever and greenish d/c and ST for several days, and ears blocked up; also with right lat hip tenderness and pain    Updated Prior Medication List: ALPRAZOLAM 0.5 MG TABS (ALPRAZOLAM) Take 1 tablet by mouth twice a day TORSEMIDE 20 MG  TABS (TORSEMIDE) 1 tab qd ESTRADIOL 2 MG  TABS (ESTRADIOL) 1 by mouth qd ALLEGRA 180 MG  TABS (FEXOFENADINE HCL) 1 by mouth qd REGLAN 10 MG  TABS (METOCLOPRAMIDE HCL) 1 by mouth three times a day FOSAMAX 70 MG  TABS (ALENDRONATE SODIUM) 1 by mouth qd PROTONIX 40 MG  PACK (PANTOPRAZOLE SODIUM) 1 by mouth qd LEVAQUIN 500 MG  TABS (LEVOFLOXACIN) 1po qd VICODIN 5-500 MG  TABS (HYDROCODONE-ACETAMINOPHEN) 1 by mouth once daily prn  Current Allergies (reviewed today): ! ASA ! VIOXX ! ERYTHROMYCIN ! * D. H. E. ! PREDNISONE ! ZITHROMAX ! SULFA ! IMITREX ! * LORCET ! AUGMENTIN ! ZOLOFT ! * REMERON ! DOXYCYCLINE ! * RITALIN ! NSAIDS  Past Medical History:    Reviewed history from 01/23/2007 and no changes required:       IBS       Migraines       GERD       Allergic rhinitis       Anxiety       Depression       Hyperlipidemia       Plantar Fascitis       Osteoporosis       GI dysmotility d/o with gastroparesis/chronic constipation  Past Surgical History:    Reviewed history from 01/23/2007 and no changes required:  Cholecystectomy       Appendectomy       Hysterectomy       Mastectomy- bilateral       Oophorectomy       R Shoulder Surgery       S/P neck lump removal   Social History:    Reviewed history and no changes required:       Married     Physical Exam  General:     mild ill Head:     Normocephalic and atraumatic without obvious abnormalities. No apparent alopecia or balding. Eyes:     No corneal or conjunctival inflammation noted. EOMI. Perrla.  Ears:     bilat tm's red, sinus tender bilat Nose:     External nasal examination shows no deformity or inflammation. Nasal mucosa are pink and moist without lesions or exudates. Mouth:     pharyngeal erythema.   Neck:     cervical lymphadenopathy.   Lungs:     Normal respiratory effort, chest expands symmetrically. Lungs are  clear to auscultation, no crackles or wheezes. Heart:     Normal rate and regular rhythm. S1 and S2 normal without gallop, murmur, click, rub or other extra sounds. Extremities:     no edema    Impression & Recommendations:  Problem # 1:  SINUSITIS- ACUTE-NOS (ICD-461.9)  Her updated medication list for this problem includes:    Levaquin 500 Mg Tabs (Levofloxacin) .Marland Kitchen... 1po qd tx as above  Problem # 2:  BURSITIS, RIGHT HIP (ICD-726.5) tx with vicodin as needed given the severity and other drug intolerances - also refer to ortho for further tx/eval Orders: Orthopedic Surgeon Referral (Ortho Surgeon)   Complete Medication List: 1)  Alprazolam 0.5 Mg Tabs (Alprazolam) .... Take 1 tablet by mouth twice a day 2)  Torsemide 20 Mg Tabs (Torsemide) .Marland Kitchen.. 1 tab qd 3)  Estradiol 2 Mg Tabs (Estradiol) .Marland Kitchen.. 1 by mouth qd 4)  Allegra 180 Mg Tabs (Fexofenadine hcl) .Marland Kitchen.. 1 by mouth qd 5)  Reglan 10 Mg Tabs (Metoclopramide hcl) .Marland Kitchen.. 1 by mouth three times a day 6)  Fosamax 70 Mg Tabs (Alendronate sodium) .Marland Kitchen.. 1 by mouth qd 7)  Protonix 40 Mg Pack (Pantoprazole sodium) .Marland Kitchen.. 1 by mouth qd 8)  Levaquin 500 Mg Tabs  (Levofloxacin) .Marland Kitchen.. 1po qd 9)  Vicodin 5-500 Mg Tabs (Hydrocodone-acetaminophen) .Marland Kitchen.. 1 by mouth once daily prn   Patient Instructions: 1)  take antibioitic as prescribed 2)  take the pain medication as needed for pain 3)  you will be contacted about the referral to orthopedic for the right hip 4)  Please schedule a follow-up appointment about august 2009    Prescriptions: VICODIN 5-500 MG  TABS (HYDROCODONE-ACETAMINOPHEN) 1 by mouth once daily prn  #40 x 1   Entered and Authorized by:   Corwin Levins MD   Signed by:   Corwin Levins MD on 07/19/2007   Method used:   Print then Give to Patient   RxID:   [id number] LEVAQUIN 500 MG  TABS (LEVOFLOXACIN) 1po qd  #10 x 0   Entered and Authorized by:   Corwin Levins MD   Signed by:   Corwin Levins MD on 07/19/2007   Method used:   Print then Give to Patient   RxID:   [id number]  ]

## 2010-07-03 NOTE — Progress Notes (Signed)
Summary: Med Refill  Phone Note Refill Request  on Sep 30, 2009 9:00 AM  Refills Requested: Medication #1:  ALPRAZOLAM 0.5 MG TABS Take 1 tablet by mouth twice a day as needed - to fill oct 20   Dosage confirmed as above?Dosage Confirmed   [facility] [address], [id number] Initial call taken by: Scharlene Gloss,  Sep 30, 2009 9:00 AM  Follow-up for Phone Call        Rx faxed to pharmacy Follow-up by: Margaret Pyle, CMA,  Sep 30, 2009 1:16 PM    New/Updated Medications: ALPRAZOLAM 0.5 MG TABS (ALPRAZOLAM) Take 1 tablet by mouth twice a day as needed - to fill Sep 30, 2009 Prescriptions: ALPRAZOLAM 0.5 MG TABS (ALPRAZOLAM) Take 1 tablet by mouth twice a day as needed - to fill Sep 30, 2009  #60 x 5   Entered and Authorized by:   Corwin Levins MD   Signed by:   Corwin Levins MD on 09/30/2009   Method used:   Print then Give to Patient   RxID:   [id number]  done hardcopy to LIM side B - dahlia  Corwin Levins MD  Sep 30, 2009 12:57 PM

## 2010-07-03 NOTE — Miscellaneous (Signed)
Summary: BONE DENSITY  Clinical Lists Changes  Orders: Added new Test order of T-Lumbar Vertebral Assessment (77082) - Signed 

## 2010-07-03 NOTE — Progress Notes (Signed)
  Phone Note Refill Request  on July 30, 2009 8:59 AM  Refills Requested: Medication #1:  HYDROCODONE-ACETAMINOPHEN 7.5-325 MG TABS 1po once daily as needed   Dosage confirmed as above?Dosage Confirmed   Last Refilled: 04/2009   Notes: [facility] [address] [phone] Initial call taken by: Scharlene Gloss,  July 30, 2009 9:00 AM  Follow-up for Phone Call        rx faxed to pharmacy Follow-up by: Margaret Pyle, CMA,  July 30, 2009 11:09 AM    Prescriptions: HYDROCODONE-ACETAMINOPHEN 7.5-325 MG TABS (HYDROCODONE-ACETAMINOPHEN) 1po once daily as needed  #40 x 1   Entered and Authorized by:   Corwin Levins MD   Signed by:   Corwin Levins MD on 07/30/2009   Method used:   Print then Give to Patient   RxID:   [id number]  done hardcopy to [facility] side B - dahlia  Corwin Levins MD  July 30, 2009 11:07 AM

## 2010-07-03 NOTE — Assessment & Plan Note (Signed)
Summary: YEARLY FU/[facility]MANA/LABS SAME DAY/$50 /NWS   Vital Signs:  Patient Profile:   70 Years Old Female Weight:      141 pounds Temp:     96.8 degrees F oral Pulse rate:   96 / minute BP sitting:   136 / 80  (left arm) Cuff size:   regular  Vitals Entered By: Payton Spark CMA (March 20, 2008 10:27 AM)                 Preventive Care Screening  Last Pneumovax:    Next Due:  01/2011  Mammogram:    Date:  03/20/2008    Results:  deferred   Chief Complaint:  CPX.  History of Present Illness: wants to take the wellbutrin increased from the 150 she is actually taking; here with acuet onset sinus symptoms over the past 2 day, alsos with recurrent headaches, usually post right occipital - pressure, severe , not throbbing and no visual problems and does have soud and light sensitivity ; used to go [facility] - but has been going there 40 yrs and dr Meryl Crutch has jsut retired; woulld consider in the future, more depressed lately with not wanting to get up the AM, not working anymore, has someone to clean the house; not much to look forward to; also with itchy rash to between the 4th and 5th fingers    Updated Prior Medication List: ALPRAZOLAM 0.5 MG TABS (ALPRAZOLAM) Take 1 tablet by mouth twice a day as needed - to fill Apr 01, 2008 TORSEMIDE 20 MG  TABS (TORSEMIDE) 1 tab once daily ESTRADIOL 2 MG  TABS (ESTRADIOL) 1 by mouth once daily ALLEGRA 180 MG  TABS (FEXOFENADINE HCL) 1 by mouth once daily REGLAN 10 MG  TABS (METOCLOPRAMIDE HCL) 1 by mouth three times a day FOSAMAX 70 MG  TABS (ALENDRONATE SODIUM) 1 by mouth qwk PROTONIX 40 MG  PACK (PANTOPRAZOLE SODIUM) 1 by mouth once daily WELLBUTRIN XL 300 MG XR24H-TAB (BUPROPION HCL) 1 by mouth once daily SERTRALINE HCL 50 MG TABS (SERTRALINE HCL) 1 by mouth once daily HYDROCODONE-ACETAMINOPHEN 7.5-325 MG TABS (HYDROCODONE-ACETAMINOPHEN) 1po once daily as needed TRIAMCINOLONE ACETONIDE 0.5 % CREA (TRIAMCINOLONE  ACETONIDE) use asd two times a day as needed LEVAQUIN 500 MG TABS (LEVOFLOXACIN) 1 by mouth once daily  Current Allergies (reviewed today): ! ASA ! VIOXX ! ERYTHROMYCIN ! * D. H. E. ! PREDNISONE ! ZITHROMAX ! SULFA ! IMITREX ! * LORCET ! AUGMENTIN ! ZOLOFT ! * REMERON ! DOXYCYCLINE ! * RITALIN ! NSAIDS  Past Medical History:    Reviewed history from 07/19/2007 and no changes required:       IBS       Migraines       GERD       Allergic rhinitis       Anxiety       Depression       Hyperlipidemia       Plantar Fascitis       Osteoporosis       GI dysmotility d/o with gastroparesis/chronic constipation  Past Surgical History:    Reviewed history from 01/23/2007 and no changes required:       Cholecystectomy       Appendectomy       Hysterectomy       Mastectomy- bilateral       Oophorectomy       R Shoulder Surgery       S/P neck lump removal  Family History:    Reviewed history and no changes required:       DM - sister and mother, and mult relative on father side       heart disease       grandmother - breast cancer  Social History:    Reviewed history from 07/19/2007 and no changes required:       Married       2 sons       retired - Audiological scientist       Never Smoked       Alcohol use-no   Risk Factors:  Tobacco use:  never Alcohol use:  no  Mammogram History:     Date of Last Mammogram:  03/20/2008    Results:  deferred   Review of Systems       The patient complains of depression.  The patient denies anorexia, fever, weight loss, weight gain, vision loss, decreased hearing, hoarseness, chest pain, syncope, dyspnea on exertion, peripheral edema, prolonged cough, headaches, hemoptysis, abdominal pain, melena, hematochezia, severe indigestion/heartburn, hematuria, incontinence, genital sores, muscle weakness, suspicious skin lesions, transient blindness, difficulty walking, unusual weight change, abnormal bleeding, enlarged lymph nodes, angioedema,  and breast masses.         all otherwise negative    Physical Exam  General:     alert and overweight-appearing - mild at best Head:     Normocephalic and atraumatic without obvious abnormalities. No apparent alopecia or balding. Eyes:     No corneal or conjunctival inflammation noted. EOMI. Perrla.  Ears:     bilat tm's red, sinus tender bilat Nose:     External nasal examination shows no deformity or inflammation. Nasal mucosa are pink and moist without lesions or exudates. Mouth:     pharyngeal erythema and fair dentition.   Neck:     No deformities, masses, or tenderness noted. Lungs:     Normal respiratory effort, chest expands symmetrically. Lungs are clear to auscultation, no crackles or wheezes. Heart:     Normal rate and regular rhythm. S1 and S2 normal without gallop, murmur, click, rub or other extra sounds. Abdomen:     Bowel sounds positive,abdomen soft and non-tender without masses, organomegaly or hernias noted. Msk:     no joint tenderness and no joint swelling.   Extremities:     no edema, no ulcers  Neurologic:     cranial nerves II-XII intact and strength normal in all extremities.   Skin:     mild erythema c/w exzema to skin between 4th and 5th fingers Psych:     depressed affect and slightly anxious.      Impression & Recommendations:  Problem # 1:  Preventive Health Care (ICD-V70.0) Overall doing well, up to date, counseled on routine health concerns for screening and prevention, immunizations up to date or declined, labs ordered, ecg reviewed, for flu shot today   Orders: EKG w/ Interpretation (93000) TLB-BMP (Basic Metabolic Panel-BMET) (80048-METABOL) TLB-CBC Platelet - w/Differential (85025-CBCD) TLB-Hepatic/Liver Function Pnl (80076-HEPATIC) TLB-Lipid Panel (80061-LIPID) TLB-TSH (Thyroid Stimulating Hormone) (84443-TSH) T-Vitamin D (25-Hydroxy) (16[address]-60454) TLB-Udip ONLY (81003-UDIP)   Problem # 2:  OSTEOPOROSIS (ICD-733.00)  Her  updated medication list for this problem includes:    Fosamax 70 Mg Tabs (Alendronate sodium) .Marland Kitchen... 1 by mouth qwk  Orders: T-Bone Densitometry (09811) for dxa as she is due  Problem # 3:  SINUSITIS- ACUTE-NOS (ICD-461.9)  The following medications were removed from the medication list:    Levaquin 500 Mg Tabs (  Levofloxacin) .Marland Kitchen... 1po qd  Her updated medication list for this problem includes:    Levaquin 500 Mg Tabs (Levofloxacin) .Marland Kitchen... 1 by mouth once daily treat as above, f/u any worsening signs or symptoms   Problem # 4:  DEPRESSION (ICD-311)  Her updated medication list for this problem includes:    Alprazolam 0.5 Mg Tabs (Alprazolam) .Marland Kitchen... Take 1 tablet by mouth twice a day as needed - to fill Apr 01, 2008    Wellbutrin Xl 300 Mg Xr24h-tab (Bupropion hcl) .Marland Kitchen... 1 by mouth once daily    Sertraline Hcl 50 Mg Tabs (Sertraline hcl) .Marland Kitchen... 1 by mouth once daily treat as above, f/u any worsening signs or symptoms  - higher dose wellbtrin, and add the sertraline, consider psychiatry referral if not improved in 3 to4 wks, or for any suicidal thoughts  Problem # 5:  RASH-NONVESICULAR (ICD-782.1) tx with steroid cream as needed  Her updated medication list for this problem includes:    Triamcinolone Acetonide 0.5 % Crea (Triamcinolone acetonide) ..... Use asd two times a day as needed   Complete Medication List: 1)  Alprazolam 0.5 Mg Tabs (Alprazolam) .... Take 1 tablet by mouth twice a day as needed - to fill Apr 01, 2008 2)  Torsemide 20 Mg Tabs (Torsemide) .Marland Kitchen.. 1 tab once daily 3)  Estradiol 2 Mg Tabs (Estradiol) .Marland Kitchen.. 1 by mouth once daily 4)  Allegra 180 Mg Tabs (Fexofenadine hcl) .Marland Kitchen.. 1 by mouth once daily 5)  Reglan 10 Mg Tabs (Metoclopramide hcl) .Marland Kitchen.. 1 by mouth three times a day 6)  Fosamax 70 Mg Tabs (Alendronate sodium) .Marland Kitchen.. 1 by mouth qwk 7)  Protonix 40 Mg Pack (Pantoprazole sodium) .Marland Kitchen.. 1 by mouth once daily 8)  Wellbutrin Xl 300 Mg Xr24h-tab (Bupropion hcl) .Marland Kitchen.. 1 by mouth  once daily 9)  Sertraline Hcl 50 Mg Tabs (Sertraline hcl) .Marland Kitchen.. 1 by mouth once daily 10)  Hydrocodone-acetaminophen 7.5-325 Mg Tabs (Hydrocodone-acetaminophen) .Marland Kitchen.. 1po once daily as needed 11)  Triamcinolone Acetonide 0.5 % Crea (Triamcinolone acetonide) .... Use asd two times a day as needed 12)  Levaquin 500 Mg Tabs (Levofloxacin) .Marland Kitchen.. 1 by mouth once daily  Other Orders: Influenza Vaccine NON MCR (62130)   Patient Instructions: 1)  you received the flu shot today 2)  increase the wellbutrin to 300 mg per day 3)  start the sertraline (generic zoloft) at 50 mg per day 4)  take the hydrocodone sparingly as needed for the headaches 5)  Please take all other new medications as prescribed - the antibiotics, and the cream 6)  Continue all medications that you may have been taking previously 7)  please schedule the bone density prior to leaving today 8)  Please go to the Lab in the basement for your blood tests today 9)  all prescriptions were sent to the pharmacy except for the alprazolam and the hydrocodone 10)  Please schedule a follow-up appointment in 6 months.   Prescriptions: PROTONIX 40 MG  PACK (PANTOPRAZOLE SODIUM) 1 by mouth once daily  #30 x 11   Entered and Authorized by:   Corwin Levins MD   Signed by:   Corwin Levins MD on 03/20/2008   Method used:   [facility]        [facility] [address]  [facility]       [address]       [place], [place]  [address]  Ph: [phone] or [phone]       Fax: [phone]   RxID:   [id number] FOSAMAX 70 MG  TABS (ALENDRONATE SODIUM) 1 by mouth qwk  #12 x 3   Entered and Authorized by:   Corwin Levins MD   Signed by:   Corwin Levins MD on 03/20/2008   Method used:   [facility]        [facility] [address]  [facility]       [address]       [place], [place]  [address]       Ph: [phone] or [phone]       Fax: [phone]   RxID:    [id number] REGLAN 10 MG  TABS (METOCLOPRAMIDE HCL) 1 by mouth three times a day  #90 x 11   Entered and Authorized by:   Corwin Levins MD   Signed by:   Corwin Levins MD on 03/20/2008   Method used:   [facility]        [facility] [address]  [facility]       [address]       [place], [place]  [address]       Ph: [phone] or [phone]       Fax: [phone]   RxID:   [id number] ALLEGRA 180 MG  TABS (FEXOFENADINE HCL) 1 by mouth once daily  #30 x 11   Entered and Authorized by:   Corwin Levins MD   Signed by:   Corwin Levins MD on 03/20/2008   Method used:   [facility]        [facility] [address]  [facility]       [address]       [place], [place]  [address]       Ph: [phone] or [phone]       Fax: [phone]   RxID:   [id number] ESTRADIOL 2 MG  TABS (ESTRADIOL) 1 by mouth once daily  #30 x 11   Entered and Authorized by:   Corwin Levins MD   Signed by:   Corwin Levins MD on 03/20/2008   Method used:   [facility]        [facility] [address]  [facility]       [address]       [place], [place]  [address]       Ph: [phone] or [phone]       Fax: [phone]   RxID:   [id number] TORSEMIDE 20 MG  TABS (TORSEMIDE) 1 tab once daily  #30 x 11   Entered and Authorized by:   Corwin Levins MD   Signed by:   Corwin Levins MD on 03/20/2008   Method used:   [facility]        [facility] [address]  [facility]       [address]       [place], [place]  [address]       Ph: [phone] or [phone]       Fax: [phone]   RxID:   [id number] ALPRAZOLAM 0.5 MG TABS (ALPRAZOLAM) Take  1 tablet by mouth twice a day as needed - to fill Apr 01, 2008  #60 x 5   Entered and Authorized by:   Corwin Levins MD   Signed by:   Corwin Levins MD on 03/20/2008   Method used:   Print then Give to Patient    RxID:   [id number] LEVAQUIN 500 MG TABS (LEVOFLOXACIN) 1 by mouth once daily  #1- x 0   Entered and Authorized by:   Corwin Levins MD   Signed by:   Corwin Levins MD on 03/20/2008   Method used:   [facility]        [facility] [address]  [facility]       [address]       [place], [place]  [address]       Ph: [phone] or [phone]       Fax: [phone]   RxID:   [id number] TRIAMCINOLONE ACETONIDE 0.5 % CREA (TRIAMCINOLONE ACETONIDE) use asd two times a day as needed  #1 x 2   Entered and Authorized by:   Corwin Levins MD   Signed by:   Corwin Levins MD on 03/20/2008   Method used:   [facility]        [facility] [address]  [facility]       [address]       [place], [place]  [address]       Ph: [phone] or [phone]       Fax: [phone]   RxID:   [id number] HYDROCODONE-ACETAMINOPHEN 7.5-325 MG TABS (HYDROCODONE-ACETAMINOPHEN) 1po once daily as needed  #40 x 1   Entered and Authorized by:   Corwin Levins MD   Signed by:   Corwin Levins MD on 03/20/2008   Method used:   Print then Give to Patient   RxID:   [id number] SERTRALINE HCL 50 MG TABS (SERTRALINE HCL) 1 by mouth once daily  #30 x 11   Entered and Authorized by:   Corwin Levins MD   Signed by:   Corwin Levins MD on 03/20/2008   Method used:   [facility] to        [facility] [address]  [facility]       [address]       [place], [place]  [address]       Ph: [phone] or [phone]       Fax: [phone]   RxID:   [id number] WELLBUTRIN XL 300 MG XR24H-TAB (BUPROPION HCL) 1 by mouth once daily  #30 x 11   Entered and Authorized by:   Corwin Levins MD   Signed by:   Corwin Levins MD on 03/20/2008   Method used:   [facility] to        [facility] [address]  [facility]       [address]       [place], [place]  [address]       Ph: [phone]  or [phone]       Fax: [phone]   RxID:   [id number]  ]  Influenza Vaccine    Vaccine Type: Fluvax Non-MCR    Site: left deltoid    Mfr:  GlaxoSmith[employer]line    Dose: 0.5 ml    Route: IM    Given by: Payton Spark CMA    Exp. Date: 11/28/2008    Lot #: [id number]    VIS given: 12/23/06 version given March 20, 2008.  Flu Vaccine Consent Questions    Do you have a history of severe allergic reactions to this vaccine? no    Any prior history of allergic reactions to egg and/or gelatin? no    Do you have a sensitivity to the preservative Thimersol? no    Do you have a past history of Guillan-Barre Syndrome? no    Do you currently have an acute febrile illness? no    Have you ever had a severe reaction to latex? no    Vaccine information given and explained to patient? yes    Are you currently pregnant? no

## 2010-07-03 NOTE — Progress Notes (Signed)
  Phone Note Call from Patient Call back at Home Phone [phone]   Caller: Patient/[phone] Call For: Corwin Levins MD Summary of Call: per Deon Pilling call insurance wont pay for the  Protonix pt need a new rx for omeprazole.Marland KitchenMarland KitchenJaxson Barnes is having heart burn also  [facility] [address]  [facility] [address] [place], [place]  [address] Ph: [phone] or [phone] Fax: [phone] Initial call taken by: Shelbie Proctor,  June 18, 2008 3:52 PM  Follow-up for Phone Call        ok to change to generic prilosec  - done escript Follow-up by: Corwin Levins MD,  June 20, 2008 3:18 PM  Additional Follow-up for Phone Call Additional follow up Details #1::        called pt to inform  pt made aware  Additional Follow-up by: Shelbie Proctor,  June 20, 2008 3:49 PM    New/Updated Medications: OMEPRAZOLE 20 MG TBEC (OMEPRAZOLE) 2 by mouth once daily   Prescriptions: OMEPRAZOLE 20 MG TBEC (OMEPRAZOLE) 2 by mouth once daily  #60 x 11   Entered and Authorized by:   Corwin Levins MD   Signed by:   Corwin Levins MD on 06/20/2008   Method used:   [facility]        [facility] [address]  [facility]       [address]       [place], [place]  [address]       Ph: [phone] or [phone]       Fax: [phone]   RxID:   [id number]

## 2010-07-03 NOTE — Miscellaneous (Signed)
Summary: Orders Update  Clinical Lists Changes  Orders: Added new Test order of T-Bone Densitometry (77080) - Signed Added new Test order of T-Lumbar Vertebral Assessment (77082) - Signed 

## 2010-07-03 NOTE — Assessment & Plan Note (Signed)
Summary: YEARLY FU/ LABS SAME DAY /NWS   Vital Signs:  Patient profile:   70 year old female Height:      61. inches Weight:      140 pounds BMI:     26.55 O2 Sat:      98 % on Room air Temp:     97 degrees F oral Pulse rate:   74 / minute BP sitting:   132 / 82  (left arm) Cuff size:   regular  Vitals Entered By: Zella Ball EWing  O2 Flow:  Room air  CC: yearly followup/RE   CC:  yearly followup/RE.  History of Present Illness: here with facial pain, pressure, fever adn greenish d/c for 3 days; o/w oding well , Pt denies CP, sob, doe, wheezing, orthopnea, pnd, worsening LE edema, palps, dizziness or syncope  Pt denies new neuro symptoms such as headache, facial or extremity weakness  Also with bialt earache.    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 Take 1 Tablet By Mouth Twice A Day As Needed - To Fill Mar 20, 2009 2)  Torsemide 20 Mg  Tabs (Torsemide) .Marland Kitchen.. 1 Tab Once Daily 3)  Estradiol 2 Mg  Tabs (Estradiol) .Marland Kitchen.. 1 By Mouth Once Daily 4)  Allegra 180 Mg  Tabs (Fexofenadine Hcl) .Marland Kitchen.. 1 By Mouth Once Daily 5)  Reglan 10 Mg  Tabs (Metoclopramide Hcl) .Marland Kitchen.. 1 By Mouth Three Times A Day 6)  Fosamax 70 Mg  Tabs (Alendronate Sodium) .Marland Kitchen.. 1 By Mouth Qwk 7)  Omeprazole 20 Mg Tbec (Omeprazole) .... 2 By Mouth Once Daily 8)  Wellbutrin Xl 300 Mg Xr24h-Tab (Bupropion Hcl) .Marland Kitchen.. 1 By Mouth Once Daily 9)   Sertraline Hcl 50 Mg Tabs (Sertraline Hcl) .Marland Kitchen.. 1 By Mouth Once Daily 10)  Hydrocodone-Acetaminophen 7.5-325 Mg Tabs (Hydrocodone-Acetaminophen) .Marland Kitchen.. 1po Once Daily As Needed 11)  Triamcinolone Acetonide 0.5 % Crea (Triamcinolone Acetonide) .... Use Asd Two Times A Day As Needed  Current Medications (verified): 1)  Alprazolam 0.5 Mg Tabs (Alprazolam) .... Take 1 Tablet By Mouth Twice A Day As Needed - To Fill Mar 20, 2009 2)  Torsemide 20 Mg  Tabs (Torsemide) .Marland Kitchen.. 1 Tab Once Daily 3)  Estradiol 2 Mg  Tabs (Estradiol) .Marland Kitchen.. 1 By Mouth Once Daily 4)  Allegra 180 Mg  Tabs (Fexofenadine Hcl) .Marland Kitchen.. 1 By Mouth Once Daily 5)  Reglan 10 Mg  Tabs (Metoclopramide Hcl) .Marland Kitchen.. 1 By Mouth Three Times A Day 6)  Fosamax 70 Mg  Tabs (Alendronate Sodium) .Marland Kitchen.. 1 By Mouth Qwk 7)  Lansoprazole 30 Mg Cpdr (Lansoprazole) .Marland Kitchen.. 1 By Mouth Once Daily 8)  Wellbutrin Xl 300 Mg Xr24h-Tab (Bupropion Hcl) .Marland Kitchen.. 1 By Mouth Once Daily 9)  Sertraline Hcl 50 Mg Tabs (Sertraline Hcl) .Marland Kitchen.. 1 By Mouth Once Daily 10)  Hydrocodone-Acetaminophen 7.5-325 Mg Tabs (Hydrocodone-Acetaminophen) .Marland Kitchen.. 1po Once Daily As Needed 11)  Triamcinolone Acetonide 0.5 % Crea (Triamcinolone  Acetonide) .... Use Asd Two Times A Day As Needed 12)  Azithromycin 250 Mg Tabs (Azithromycin) .... 2po Qd For 1 Day, Then 1po Qd For 4days, Then Stop  Allergies (verified): 1)  ! Asa 2)  ! Vioxx 3)  ! Erythromycin 4)  ! * D. H. E. 5)  ! Prednisone 6)  ! Sulfa 7)  ! Imitrex 8)  ! * Lorcet 9)  ! Augmentin 10)  ! Zoloft 11)  ! * Remeron 12)  ! Doxycycline 13)  ! * Ritalin 14)  ! Nsaids  Past History:  Past Surgical History: Last updated: 01/23/2007 Cholecystectomy Appendectomy Hysterectomy Mastectomy- bilateral Oophorectomy R Shoulder Surgery S/P neck lump removal  Family History: Last updated: 03/20/2008 DM - sister and mother, and mult relative on father side heart disease grandmother - breast cancer  Social History: Last updated:  03/20/2008 Married 2 sons retired - Audiological scientist Never Smoked Alcohol use-no  Risk Factors: Smoking Status: never (03/20/2008)  Past Medical History: IBS Migraines GERD Allergic rhinitis Anxiety Depression Hyperlipidemia Plantar Fascitis Osteoporosis GI dysmotility d/o with gastroparesis/chronic constipation chronic headaches - on as needed hydrocodone  Review of Systems  The patient denies anorexia, fever, weight loss, weight gain, vision loss, decreased hearing, hoarseness, chest pain, syncope, dyspnea on exertion, peripheral edema, prolonged cough, headaches, hemoptysis, abdominal pain, melena, hematochezia, severe indigestion/heartburn, hematuria, incontinence, muscle weakness, suspicious skin lesions, transient blindness, difficulty walking, depression, unusual weight change, abnormal bleeding, enlarged lymph nodes, and angioedema.         all otherwise negative per pt - except omeprazole seems to make her constipated; also with new lump to the neck near the left ear that is enlarged  Physical Exam  General:  alert and overweight-appearing.   Head:  normocephalic and atraumatic.   Eyes:  vision grossly intact, pupils equal, and pupils round.   Ears:  bilat tm's red, sinus tender bilat Nose:  no external deformity and no nasal discharge.   Mouth:  no gingival abnormalities and pharynx pink and moist.   Neck:  supple , subq firm mass noted below the angle of jaw , nontender and mobile, no skin change noted Lungs:  normal respiratory effort and normal breath sounds.   Heart:  normal rate and regular rhythm.   Abdomen:  soft, non-tender, and normal bowel sounds.   Msk:  no joint tenderness and no joint swelling.   Extremities:  no edema, no erythema  Neurologic:  cranial nerves II-XII intact and strength normal in all extremities.   Psych:  slightly anxious.     Impression & Recommendations:  Problem # 1:  Preventive Health Care (ICD-V70.0)  Overall doing well, up to  date, counseled on routine health concerns for screening and prevention, immunizations up to date or declined, labs reviewed, ecg reviewed  Orders: EKG w/ Interpretation (93000) TLB-BMP (Basic Metabolic Panel-BMET) (80048-METABOL) TLB-CBC Platelet - w/Differential (85025-CBCD) TLB-Hepatic/Liver Function Pnl (80076-HEPATIC) TLB-Lipid Panel (80061-LIPID) TLB-TSH (Thyroid Stimulating Hormone) (84443-TSH) TLB-Udip ONLY (81003-UDIP) T-Vitamin D (25-Hydroxy) (16109-60454)  Problem # 2:  GERD (ICD-530.81)  Her updated medication list for this problem includes:    Lansoprazole 30 Mg Cpdr (Lansoprazole) .Marland Kitchen... 1 by mouth once daily treat as above, f/u any worsening signs or symptoms - hopeful to not cause the constipatoin  Problem # 3:  NECK MASS (UJW-119.1)  refer ENT  Orders: ENT Referral (ENT)  Problem # 4:  SINUSITIS- ACUTE-NOS (ICD-461.9)  Her updated medication list for this problem includes:    Azithromycin 250 Mg Tabs (Azithromycin) .Marland KitchenMarland KitchenMarland KitchenMarland Kitchen  2po qd for 1 day, then 1po qd for 4days, then stop treat as above, f/u any worsening signs or symptoms   Complete Medication List: 1)  Alprazolam 0.5 Mg Tabs (Alprazolam) .... Take 1 tablet by mouth twice a day as needed - to fill Mar 20, 2009 2)  Torsemide 20 Mg Tabs (Torsemide) .Marland Kitchen.. 1 tab once daily 3)  Estradiol 2 Mg Tabs (Estradiol) .Marland Kitchen.. 1 by mouth once daily 4)  Allegra 180 Mg Tabs (Fexofenadine hcl) .Marland Kitchen.. 1 by mouth once daily 5)  Reglan 10 Mg Tabs (Metoclopramide hcl) .Marland Kitchen.. 1 by mouth three times a day 6)  Fosamax 70 Mg Tabs (Alendronate sodium) .Marland Kitchen.. 1 by mouth qwk 7)  Lansoprazole 30 Mg Cpdr (Lansoprazole) .Marland Kitchen.. 1 by mouth once daily 8)  Wellbutrin Xl 300 Mg Xr24h-tab (Bupropion hcl) .Marland Kitchen.. 1 by mouth once daily 9)  Sertraline Hcl 50 Mg Tabs (Sertraline hcl) .Marland Kitchen.. 1 by mouth once daily 10)  Hydrocodone-acetaminophen 7.5-325 Mg Tabs (Hydrocodone-acetaminophen) .Marland Kitchen.. 1po once daily as needed 11)  Triamcinolone Acetonide 0.5 % Crea  (Triamcinolone acetonide) .... Use asd two times a day as needed 12)  Azithromycin 250 Mg Tabs (Azithromycin) .... 2po qd for 1 day, then 1po qd for 4days, then stop  Other Orders: Flu Vaccine 64yrs + (16109) Administration Flu vaccine - MCR (U0454)  Patient Instructions: 1)  you had the flu shot today 2)  stop the omeprazole 3)  start the generic prevacid 4)  You will be contacted about the referral(s) to: ENTtake all new medications as prescribed - the antibiotic 5)  Continue all previous medications as before this visit  6)  Please go to the Lab in the basement for your blood and/or urine tests today 7)  Please schedule a follow-up appointment in 1 year or sooner if needed Prescriptions: HYDROCODONE-ACETAMINOPHEN 7.5-325 MG TABS (HYDROCODONE-ACETAMINOPHEN) 1po once daily as needed  #40 x 1   Entered and Authorized by:   Corwin Levins MD   Signed by:   Corwin Levins MD on 04/01/2009   Method used:   Print then Give to Patient   RxID:   [id number] AZITHROMYCIN 250 MG TABS (AZITHROMYCIN) 2po qd for 1 day, then 1po qd for 4days, then stop  #6 x 1   Entered and Authorized by:   Corwin Levins MD   Signed by:   Corwin Levins MD on 04/01/2009   Method used:   Print then Give to Patient   RxID:   [id number] TRIAMCINOLONE ACETONIDE 0.5 % CREA (TRIAMCINOLONE ACETONIDE) use asd two times a day as needed  #1 x 2   Entered and Authorized by:   Corwin Levins MD   Signed by:   Corwin Levins MD on 04/01/2009   Method used:   Print then Give to Patient   RxID:   [id number] LANSOPRAZOLE 30 MG CPDR (LANSOPRAZOLE) 1 by mouth once daily  #90 x 3   Entered and Authorized by:   Corwin Levins MD   Signed by:   Corwin Levins MD on 04/01/2009   Method used:   Print then Give to Patient   RxID:   [id number]    Flu Vaccine Consent Questions     Do you have a history of severe allergic reactions to this vaccine? no    Any prior history of allergic reactions to egg and/or gelatin? no     Do you have a sensitivity to the preservative Thimersol? no    Do you  have a past history of Guillan-Barre Syndrome? no    Do you currently have an acute febrile illness? no    Have you ever had a severe reaction to latex? no    Vaccine information given and explained to patient? yes    Are you currently pregnant? no    Lot Number:[id number]   Exp Date:11/28/2009   Site Given  Left Deltoid IMdflu

## 2010-07-03 NOTE — Assessment & Plan Note (Signed)
Summary: 6 mth fu/$50/jss   Vital Signs:  Patient profile:   70 year old female Height:      61 inches Weight:      142 pounds BMI:     26.93 O2 Sat:      98 % Temp:     96.8 degrees F oral Pulse rate:   79 / minute BP sitting:   118 / 68  (right arm) Cuff size:   regular  Vitals Entered By: Windell Norfolk (September 17, 2008 10:08 AM)  Preventive Care Screening  Bone Density:    Date:  04/02/2008    Next Due:  04/2010    Results:  abnormal std dev  CC: 6 month f/u   CC:  6 month f/u.  History of Present Illness: here with chronic headaches, biut also ? new mild sinusitis with facial pain, pressure, fever and greensih d/c for 3 days , on top of several weeks nasal allergy symptoms with clearish d/c; most recent dxa scan d/w pt - improved overall somewhtat from previous on the fosamax, excercise, and calcium.vit d supp  Problems Prior to Update: 1)  Sinusitis- Acute-nos  (ICD-461.9) 2)  Rash-nonvesicular  (ICD-782.1) 3)  Preventive Health Care  (ICD-V70.0) 4)  Vaginitis  (ICD-616.10) 5)  Bursitis, Right Hip  (ICD-726.5) 6)  Sinusitis- Acute-nos  (ICD-461.9) 7)  Osteoporosis  (ICD-733.00) 8)  Plantar Fasciitis  (ICD-728.71) 9)  Hyperlipidemia  (ICD-272.4) 10)  Depression  (ICD-311) 11)  Anxiety  (ICD-300.00) 12)  Allergic Rhinitis  (ICD-477.9) 13)  Gerd  (ICD-530.81) 14)  Migraine Headache  (ICD-346.90) 15)  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 Take 1 Tablet By Mouth Twice A Day As Needed - To Fill Apr 01, 2008 2)  Torsemide 20 Mg  Tabs (Torsemide) .Marland Kitchen.. 1 Tab Once Daily 3)  Estradiol 2 Mg  Tabs (Estradiol) .Marland Kitchen.. 1 By Mouth Once Daily 4)  Allegra 180 Mg  Tabs (Fexofenadine Hcl) .Marland Kitchen.. 1 By Mouth Once Daily 5)  Reglan 10 Mg  Tabs (Metoclopramide Hcl) .Marland Kitchen.. 1 By Mouth Three Times A Day 6)  Fosamax 70 Mg  Tabs (Alendronate Sodium) .Marland Kitchen.. 1 By Mouth Qwk 7)  Omeprazole 20 Mg Tbec (Omeprazole) .... 2 By Mouth Once Daily 8)   Wellbutrin Xl 300 Mg Xr24h-Tab (Bupropion Hcl) .Marland Kitchen.. 1 By Mouth Once Daily 9)  Sertraline Hcl 50 Mg Tabs (Sertraline Hcl) .Marland Kitchen.. 1 By Mouth Once Daily 10)  Hydrocodone-Acetaminophen 7.5-325 Mg Tabs (Hydrocodone-Acetaminophen) .Marland Kitchen.. 1po Once Daily As Needed 11)  Triamcinolone Acetonide 0.5 % Crea (Triamcinolone Acetonide) .... Use Asd Two Times A Day As Needed 12)  Levaquin 500 Mg Tabs (Levofloxacin) .Marland Kitchen.. 1 By Mouth Once Daily  Current Medications (verified): 1)  Alprazolam 0.5 Mg Tabs (Alprazolam) .... Take 1 Tablet By Mouth Twice A Day As Needed - To Fill Sep 17, 2008 2)  Torsemide 20 Mg  Tabs (Torsemide) .Marland Kitchen.. 1 Tab Once Daily 3)  Estradiol 2 Mg  Tabs (Estradiol) .Marland Kitchen.. 1 By Mouth Once Daily 4)  Allegra 180 Mg  Tabs (Fexofenadine Hcl) .Marland Kitchen.. 1 By Mouth Once Daily 5)  Reglan 10 Mg  Tabs (Metoclopramide Hcl) .Marland Kitchen.. 1 By Mouth Three Times A Day 6)  Fosamax 70 Mg  Tabs (Alendronate Sodium) .Marland Kitchen.. 1 By Mouth Qwk 7)  Omeprazole 20 Mg Tbec (Omeprazole) .... 2 By Mouth Once Daily 8)  Wellbutrin Xl 300 Mg Xr24h-Tab (Bupropion Hcl) .Marland Kitchen.. 1 By Mouth Once Daily 9)  Sertraline Hcl  50 Mg Tabs (Sertraline Hcl) .Marland Kitchen.. 1 By Mouth Once Daily 10)  Hydrocodone-Acetaminophen 7.5-325 Mg Tabs (Hydrocodone-Acetaminophen) .Marland Kitchen.. 1po Once Daily As Needed 11)  Triamcinolone Acetonide 0.5 % Crea (Triamcinolone Acetonide) .... Use Asd Two Times A Day As Needed 12)  Levaquin 500 Mg Tabs (Levofloxacin) .Marland Kitchen.. 1 By Mouth Once Daily  Allergies (verified): 1)  ! Asa 2)  ! Vioxx 3)  ! Erythromycin 4)  ! * D. H. E. 5)  ! Prednisone 6)  ! Zithromax 7)  ! Sulfa 8)  ! Imitrex 9)  ! * Lorcet 10)  ! Augmentin 11)  ! Zoloft 12)  ! * Remeron 13)  ! Doxycycline 14)  ! * Ritalin 15)  ! Nsaids  Past History:  Past Surgical History:    Cholecystectomy    Appendectomy    Hysterectomy    Mastectomy- bilateral    Oophorectomy    R Shoulder Surgery    S/P neck lump removal     (01/23/2007)  Social History:    Married    2 sons     retired - Audiological scientist    Never Smoked    Alcohol use-no     (03/20/2008)  Risk Factors:    Alcohol Use: N/A    >5 drinks/d w/in last 3 months: N/A    Caffeine Use: N/A    Diet: N/A    Exercise: N/A  Risk Factors:    Smoking Status: never (03/20/2008)    Packs/Day: N/A    Cigars/wk: N/A    Pipe Use/wk: N/A    Cans of tobacco/wk: N/A    Passive Smoke Exposure: N/A  Past Medical History:    IBS    Migraines    GERD    Allergic rhinitis    Anxiety    Depression    Hyperlipidemia    Plantar Fascitis    Osteoporosis    GI dysmotility d/o with gastroparesis/chronic constipation    chronic headaches  Social History:    Reviewed history from 03/20/2008 and no changes required:       Married       2 sons       retired - Audiological scientist       Never Smoked       Alcohol use-no  Review of Systems       all otherwise negative , psych meds seems stable at this time, able to funciton well, and denies suicidal ideation  Physical Exam  General:  alert and well-developed. , mild ill  Head:  normocephalic and atraumatic.   Eyes:  vision grossly intact, pupils equal, and pupils round.   Ears:  bilat tm's red, sinus tender bilat Nose:  nasal dischargemucosal pallor and mucosal erythema.   Mouth:  pharyngeal erythema and fair dentition.   Neck:  supple and no masses.   Lungs:  normal respiratory effort and normal breath sounds.   Heart:  normal rate, regular rhythm, and no murmur.   Abdomen:  soft, non-tender, and normal bowel sounds.   Extremities:  no edema, no ulcers    Impression & Recommendations:  Problem # 1:  SINUSITIS- ACUTE-NOS (ICD-461.9)  The following medications were removed from the medication list:    Levaquin 500 Mg Tabs (Levofloxacin) .Marland Kitchen... 1 by mouth once daily Her updated medication list for this problem includes:    Levaquin 500 Mg Tabs (Levofloxacin) .Marland Kitchen... 1 by mouth once daily treat as above, f/u any worsening signs or symptoms   Problem # 2:  ALLERGIC  RHINITIS (ICD-477.9)  Her updated medication list for this problem includes:    Allegra 180 Mg Tabs (Fexofenadine hcl) .Marland Kitchen... 1 by mouth once daily declines adding flonase at this time  Problem # 3:  OSTEOPOROSIS (ICD-733.00)  Her updated medication list for this problem includes:    Fosamax 70 Mg Tabs (Alendronate sodium) .Marland Kitchen... 1 by mouth qwk stable overall by hx and exam, ok to continue meds/tx as is , f/u dxa 2 yrs  Problem # 4:  ANXIETY (ICD-300.00)  Her updated medication list for this problem includes:    Alprazolam 0.5 Mg Tabs (Alprazolam) .Marland Kitchen... Take 1 tablet by mouth twice a day as needed - to fill Sep 17, 2008    Wellbutrin Xl 300 Mg Xr24h-tab (Bupropion hcl) .Marland Kitchen... 1 by mouth once daily    Sertraline Hcl 50 Mg Tabs (Sertraline hcl) .Marland Kitchen... 1 by mouth once daily stable overall by hx and exam, ok to continue meds/tx as is   Complete Medication List: 1)  Alprazolam 0.5 Mg Tabs (Alprazolam) .... Take 1 tablet by mouth twice a day as needed - to fill Sep 17, 2008 2)  Torsemide 20 Mg Tabs (Torsemide) .Marland Kitchen.. 1 tab once daily 3)  Estradiol 2 Mg Tabs (Estradiol) .Marland Kitchen.. 1 by mouth once daily 4)  Allegra 180 Mg Tabs (Fexofenadine hcl) .Marland Kitchen.. 1 by mouth once daily 5)  Reglan 10 Mg Tabs (Metoclopramide hcl) .Marland Kitchen.. 1 by mouth three times a day 6)  Fosamax 70 Mg Tabs (Alendronate sodium) .Marland Kitchen.. 1 by mouth qwk 7)  Omeprazole 20 Mg Tbec (Omeprazole) .... 2 by mouth once daily 8)  Wellbutrin Xl 300 Mg Xr24h-tab (Bupropion hcl) .Marland Kitchen.. 1 by mouth once daily 9)  Sertraline Hcl 50 Mg Tabs (Sertraline hcl) .Marland Kitchen.. 1 by mouth once daily 10)  Hydrocodone-acetaminophen 7.5-325 Mg Tabs (Hydrocodone-acetaminophen) .Marland Kitchen.. 1po once daily as needed 11)  Triamcinolone Acetonide 0.5 % Crea (Triamcinolone acetonide) .... Use asd two times a day as needed 12)  Levaquin 500 Mg Tabs (Levofloxacin) .Marland Kitchen.. 1 by mouth once daily  Other Orders: Tdap => 75yrs IM (81191) Admin 1st Vaccine (47829)  Patient Instructions: 1)  you  received the tetanus shot today 2)  Continue all medications that you may have been taking previously  3)  Please take all new medications as prescribed - the antibiotic 4)  Please schedule a follow-up appointment in 6 months with CPX labs Prescriptions: LEVAQUIN 500 MG TABS (LEVOFLOXACIN) 1 by mouth once daily  #10 x 0   Entered and Authorized by:   Corwin Levins MD   Signed by:   Corwin Levins MD on 09/17/2008   Method used:   Print then Give to Patient   RxID:   [id number] ALPRAZOLAM 0.5 MG TABS (ALPRAZOLAM) Take 1 tablet by mouth twice a day as needed - to fill Sep 17, 2008  #60 x 5   Entered and Authorized by:   Corwin Levins MD   Signed by:   Corwin Levins MD on 09/17/2008   Method used:   Print then Give to Patient   RxID:   [id number]    Immunizations Administered:  Tetanus Vaccine:    Vaccine Type: Tdap    Site: right deltoid    Mfr: Gl[employer]oSmith[employer]    Dose: 0.5 ml    Route: IM    Given by: Windell Norfolk    Exp. Date: 07/25/2010    Lot #: [id number]

## 2010-07-03 NOTE — Progress Notes (Signed)
SummaryPrudy Barnes  Phone Note Refill Request Message from:  Fax from Pharmacy on March 20, 2009 9:24 AM  Refills Requested: Medication #1:  ALPRAZOLAM 0.5 MG TABS Take 1 tablet by mouth twice a day as needed - # 60   Last Refilled: 01/31/2009 [facility] [phone] Last ov 09/17/08  Next Appointment Scheduled: none Initial call taken by: Orlan Leavens,  March 20, 2009 9:25 AM  Follow-up for Phone Call        done hardcopy to [facility] side B - dahlia  Follow-up by: Corwin Levins MD,  March 20, 2009 6:17 PM  Additional Follow-up for Phone Call Additional follow up Details #1::        unsigned by MD, re-print and gave to Dr. Felicity Coyer Additional Follow-up by: Margaret Pyle, CMA,  March 21, 2009 8:40 AM    Additional Follow-up for Phone Call Additional follow up Details #2::    rx faxed to pharmacy Follow-up by: Margaret Pyle, CMA,  March 21, 2009 1:03 PM  New/Updated Medications: ALPRAZOLAM 0.5 MG TABS (ALPRAZOLAM) Take 1 tablet by mouth twice a day as needed - to fill Mar 20, 2009 Prescriptions: ALPRAZOLAM 0.5 MG TABS (ALPRAZOLAM) Take 1 tablet by mouth twice a day as needed - to fill Mar 20, 2009  #60 x 5   Entered by:   Margaret Pyle, CMA   Authorized by:   Newt Lukes MD   Signed by:   Margaret Pyle, CMA on 03/21/2009   Method used:   Print then Give to Patient   RxID:   [id number] ALPRAZOLAM 0.5 MG TABS (ALPRAZOLAM) Take 1 tablet by mouth twice a day as needed - to fill Mar 20, 2009  #60 x 5   Entered and Authorized by:   Corwin Levins MD   Signed by:   Corwin Levins MD on 03/20/2009   Method used:   Print then Give to Patient   RxID:   [id number]

## 2010-07-03 NOTE — Letter (Signed)
Summary: [facility]  [facility]   Imported By: Sherian Rein 02/17/2010 09:25:01  _____________________________________________________________________  External Attachment:    Type:   Image     Comment:   External Document

## 2010-08-20 ENCOUNTER — Other Ambulatory Visit: Payer: Self-pay | Admitting: Internal Medicine

## 2010-08-22 ENCOUNTER — Telehealth: Payer: Self-pay | Admitting: *Deleted

## 2010-08-22 NOTE — Telephone Encounter (Signed)
Left message on VM advising pt that she was Rx'd #90 x # of both medication in 04/2010 directly to pharmacy. Pt was also advised to check with pharmacy to see if medications were placed on HOLD and therefore not refilled.Pt asked to call back if any further questions.

## 2010-08-22 NOTE — Telephone Encounter (Signed)
Patient requesting a call back, wants to know why estradiol and zoloft was denied.

## 2010-08-22 NOTE — Telephone Encounter (Signed)
Called patient; left message to call back.

## 2010-09-18 ENCOUNTER — Other Ambulatory Visit: Payer: Self-pay | Admitting: Internal Medicine

## 2010-09-28 ENCOUNTER — Encounter: Payer: Self-pay | Admitting: Internal Medicine

## 2010-09-28 DIAGNOSIS — Z0000 Encounter for general adult medical examination without abnormal findings: Secondary | ICD-10-CM | POA: Insufficient documentation

## 2010-10-01 ENCOUNTER — Ambulatory Visit (INDEPENDENT_AMBULATORY_CARE_PROVIDER_SITE_OTHER): Payer: Medicare Other | Admitting: Internal Medicine

## 2010-10-01 ENCOUNTER — Encounter: Payer: Self-pay | Admitting: Internal Medicine

## 2010-10-01 VITALS — BP 110/82 | HR 80 | Temp 98.2°F | Ht 61.0 in | Wt 149.2 lb

## 2010-10-01 DIAGNOSIS — F411 Generalized anxiety disorder: Secondary | ICD-10-CM

## 2010-10-01 DIAGNOSIS — Z0000 Encounter for general adult medical examination without abnormal findings: Secondary | ICD-10-CM

## 2010-10-01 DIAGNOSIS — E785 Hyperlipidemia, unspecified: Secondary | ICD-10-CM

## 2010-10-01 DIAGNOSIS — R51 Headache: Secondary | ICD-10-CM

## 2010-10-01 DIAGNOSIS — R519 Headache, unspecified: Secondary | ICD-10-CM | POA: Insufficient documentation

## 2010-10-01 DIAGNOSIS — J309 Allergic rhinitis, unspecified: Secondary | ICD-10-CM

## 2010-10-01 MED ORDER — LEVOFLOXACIN 250 MG PO TABS: 250.0000 mg | ORAL_TABLET | Freq: Every day | ORAL | Status: AC

## 2010-10-01 NOTE — Assessment & Plan Note (Signed)
stable overall by hx and exam, most recent lab reviewed with pt, and pt to continue medical treatment as before  Lab Results  Component Value Date   WBC 8.3 04/02/2010   HGB 13.5 04/02/2010   HCT 39.2 04/02/2010   PLT 257.0 04/02/2010   CHOL 200 04/02/2010   TRIG 97.0 04/02/2010   HDL 79.20 04/02/2010   ALT 20 04/02/2010   AST 25 04/02/2010   NA 141 04/02/2010   K 4.3 04/02/2010   CL 105 04/02/2010   CREATININE 0.9 04/02/2010   BUN 11 04/02/2010   CO2 30 04/02/2010   TSH 2.03 04/02/2010

## 2010-10-01 NOTE — Assessment & Plan Note (Signed)
Atypical  - will tx for possible sinusitis, to cont the allegra for allergies (declines icnreased tx with flonase due to sister with complication from flonase), may need HA wellness for ? Transformed migraine, and consider even neurology to r/o trigeminal neuralgia, ok to hold off on imaging at this time, but will need to consider CT sinus or head MRI if not improved

## 2010-10-01 NOTE — Assessment & Plan Note (Signed)
Mild uncontrolled despite allegra use and could use flonase, but declines at this time, Continue all other medications as before,  to f/u any worsening symptoms or concerns

## 2010-10-01 NOTE — Progress Notes (Signed)
Subjective:    Patient ID: Laura Barnes, female    DOB: [birth year], 70 y.o.   MRN: [id number]  HPI  Here to f/u;  Did see Dr Lestine Box but could not aford the custom made orthotic so not much better;  Saw Dr Eulah Pont who sent her to Dr Ophelia Charter with left lower back, s/p ESI x 2 which did seem to help. Today c/o 4 wks daily and ? Has sinus infection, but pain mostly left sided, nonthrobbing but severe, constant, left sided for hours most days, better to go to bed but pain not much better, back with pain again the next day. Seems to affect the left ear somewhat, but no hearing loss, no fever, or chills, but did have some intermittent ST.  Pt denies chest pain, increased sob or doe, wheezing, orthopnea, PND, increased LE swelling, palpitations, dizziness or syncope.  Pt denies new neurological symptoms such as new headache, or facial or extremity weakness or numbness   Pt denies polydipsia, polyuria  Pt states overall good compliance with meds, trying to follow lower cholesterol diet, wt overall stable but little exercise however.  Denies worsening depressive symptoms, suicidal ideation, or panic, though has ongoing anxiety, not increased recently.  Past Medical History  Diagnosis Date  . HYPERLIPIDEMIA 01/23/2007  . ANXIETY 01/23/2007  . DEPRESSION 01/23/2007  . MIGRAINE, COMMON 04/02/2010  . MIGRAINE HEADACHE 01/23/2007  . VENOUS INSUFFICIENCY, CHRONIC 04/02/2010  . SINUSITIS- ACUTE-NOS 07/19/2007  . ALLERGIC RHINITIS 01/23/2007  . GERD 01/23/2007  . IBS 01/23/2007  . VAGINITIS 03/20/2008  . SKIN LESION 04/02/2010  . Enthesopathy of hip region 07/19/2007  . FOOT PAIN, BILATERAL 04/02/2010  . PLANTAR FASCIITIS 01/23/2007  . OSTEOPOROSIS 07/19/2007  . OSTEOPENIA 04/02/2010  . RASH-NONVESICULAR 03/20/2008  . NECK MASS 04/01/2009   Past Surgical History  Procedure Date  . Appendectomy   . Cholecystectomy   . Abdominal hysterectomy   . 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  Oophorectomy   . Right shoulder surgury   . S/p neck lump removal     reports that she has never smoked. She does not have any smokeless tobacco history on file. She reports that she does not drink alcohol or use illicit drugs. family history includes Cancer in her maternal grandmother; Diabetes in her mother, other, and sister; and Heart disease in her other. Allergies  Allergen Reactions  . Aspirin   . Doxycycline   . Erythromycin   . Hydrocodone     REACTION: itch  . Hydrocodone-Acetaminophen   . JYN:WGNFAOZHYQM+VHQIONGEX+BMWUXLKGMW Acid+Aspartame   . Methylphenidate Hcl   . Mirtazapine   . Nsaids     REACTION: stomach bleeding per pt  . Prednisone   . Rofecoxib   . Sertraline Hcl   . Sulfonamide Derivatives   . Sumatriptan    Current Outpatient Prescriptions on File Prior to Visit  Medication Sig Dispense Refill  . alendronate (FOSAMAX) 70 MG tablet Take 70 mg by mouth every 7 (seven) days. Take with a full glass of water on an empty stomach.       . ALPRAZolam (XANAX) 0.5 MG tablet Take 0.5 mg by mouth 2 (two) times daily as needed.        Marland Kitchen buPROPion (WELLBUTRIN XL) 300 MG 24 hr tablet Take 300 mg by mouth daily.        . butalbital-acetaminophen-caffeine (FIORICET, ESGIC) 50-325-40 MG per tablet TAKE 1 TABLET BY MOUTH THREE TIMES A WEEK AS NEEDED  FOR HEADACHE  15 tablet  2  . estradiol (ESTRACE) 2 MG tablet Take 2 mg by mouth daily.        . fexofenadine (ALLEGRA) 180 MG tablet Take 180 mg by mouth daily.        . metoCLOPramide (REGLAN) 10 MG tablet Take 10 mg by mouth 3 (three) times daily as needed.        . pantoprazole (PROTONIX) 40 MG tablet Take 40 mg by mouth daily.        . sertraline (ZOLOFT) 50 MG tablet Take 50 mg by mouth daily.        Marland Kitchen triamcinolone (KENALOG) 0.5 % cream Apply topically 2 (two) times daily.        Marland Kitchen triamterene-hydrochlorothiazide (MAXZIDE-25) 37.5-25 MG per tablet 1/2 - 1 by mouth once daily as needed for swelling        Review of  SystemsAll otherwise neg per pt     Objective:   Physical Exam BP 110/82  Pulse 80  Temp(Src) 98.2 F (36.8 C) (Oral)  Ht 5\' 1"  (1.549 m)  Wt 149 lb 4 oz (67.699 kg)  BMI 28.20 kg/m2  SpO2 97% Physical Exam  VS noted Constitutional: Pt appears well-developed and well-nourished.  HENT: Head: Normocephalic.  Right Ear: External ear normal.  Left Ear: External ear normal.  Bilat tm's mild erythema.  Sinus tender bilat, left > right.  Pharynx mild erythema Eyes: Conjunctivae and EOM are normal. Pupils are equal, round, and reactive to light.  Neck: Normal range of motion. Neck supple.  Cardiovascular: Normal rate and regular rhythm.   Pulmonary/Chest: Effort normal and breath sounds normal.  Abd:  Soft, NT, non-distended, + BS Neurological: Pt is alert. No cranial nerve deficit.  Skin: Skin is warm. No erythema.  Psychiatric: Pt behavior is normal. Thought content normal. 1+nervous        Assessment & Plan:

## 2010-10-01 NOTE — Patient Instructions (Signed)
Take all new medications as prescribed - the antibiotic Continue all other medications as before Please call if you change your mind about a nasal spray such as flonase for the allergies Please call if you would like to be referred to the [facility] We could even consider Neurology referral to see if you have Trigeminal Neuralgia as the reason for the left sided pain if it does not improve Please return in 6 mo with Lab testing done 3-5 days before

## 2010-10-01 NOTE — Assessment & Plan Note (Signed)
Lab Results  Component Value Date   LDLCALC 101* 04/02/2010    D/w pt - stable overall by hx and exam, most recent lab reviewed with pt, and pt to continue medical treatment as before and diet

## 2010-10-17 NOTE — Op Note (Signed)
[facility]  Patient:    Laura Barnes, Laura Barnes                     MRN: [id number] Proc. Date: 07/04/00 Adm. Date:  [id number] Disc. Date: [id number] Attending:  Glenna Fellows Tappan                           Operative Report  PREOPERATIVE DIAGNOSES:  Cholelithiasis and cholecystitis.  POSTOPERATIVE DIAGNOSES:  Cholelithiasis and cholecystitis.  PROCEDURE:  Laparoscopic cholecystectomy with intraoperative cholangiogram.  SURGEON:  Sharlet Salina T. Hoxworth, M.D.  ASSISTANT:  Chevis Pretty, M.D.  ANESTHESIA:  General.  BRIEF HISTORY:  Laura Barnes is a 70 year old white female who presented earlier today with about an 18-hour history of persistent right upper quadrant epigastric abdominal pain and nausea.  Ultrasound has shown small stones, a thickened gallbladder wall and common bile duct upper limits of normal.  She has mild elevation in her transaminase and appears to be in significant pain and epigastric tenderness on examination.  Laparoscopic cholecystectomy has been recommended. The nature of the procedure, its indications and risks of bleeding, infection, bowel, bladder or bile duct injury were discussed and understood preoperatively.  She is now brought to the operating room for this procedure.  DESCRIPTION OF PROCEDURE:  The patient was brought to the operating room and placed in supine position on the operating table, and general endotracheal anesthesia was induced.  She had received broad spectrum antibiotics.  The abdomen was sterilely prepped and draped.  Local anesthesia was used to infiltrate the trocar sites prior to the incisions.  A 1 cm incision was made at the umbilicus and then it was carried down to the midline fascia.  It was sharply incised for 1 cm and the peritoneum entered under direct vision.  A Through a mattress suture of 0 Vicryl, the Hasson trocar was placed and pneumoperitoneum established.  Under direct vision a 10 mm  trocar was placed in the subxiphoid area and two 5 mm trocars along the right subcostal margin. The gallbladder was distended and edematous, with early acute inflammatory changes.  The fundus was grasped and elevated up over the liver, and the infundibulum retracted inferolaterally.  Fiber and fatty tissue was stripped off the neck of the gallbladder and the distal gallbladder was thoroughly dissected.  The cystic duct was identified and the cystic duct/gallbladder junction dissected 360 degrees.  Calots triangle was skeletonized and the cystic artery seen coursing up into the gallbladder wall and Calots triangle. When the anatomy was clear, the cystic duct was clipped with the gallbladder junction. Operative cholangiograms were obtained through the cystic duct. This showed good filling of a normal common bile duct and intrahepatic ducts, with free flow into the duodenum and no filling defects.  Following this, the cholangiocatheter was removed and the cystic duct was doubly clipped proximally and divided.  The cystic artery was doubly clipped proximally and divided.  The gallbladder was then dissected and freed from its bed using Hough spatula cautery.  Small posterior branch of the artery was controlled with clips. The gallbladder was removed intact through the umbilicus.  Complete hemostasis was assured in the operative site.  The left upper quadrant was irrigated and suctioned.  Trocars were removed under direct vision, and all CO2 evacuated from the peritoneal cavity.  The pursestring suture was secured at the umbilicus.  Skin incisions were closed with interrupted subcuticular 4-0 Monocryl and Dermabond.  The  patient was taken to the recovery room in good condition, having tolerated the procedure well. DD:  07/04/00 TD:  07/05/00 Job: [id number]

## 2010-11-13 ENCOUNTER — Other Ambulatory Visit: Payer: Self-pay | Admitting: Internal Medicine

## 2010-11-14 ENCOUNTER — Other Ambulatory Visit: Payer: Self-pay

## 2010-11-14 MED ORDER — ALPRAZOLAM 0.5 MG PO TABS: 0.5000 mg | ORAL_TABLET | Freq: Two times a day (BID) | ORAL | Status: DC | PRN

## 2010-11-14 NOTE — Telephone Encounter (Signed)
Faxed hardcopy to pharmacy. 

## 2011-03-06 ENCOUNTER — Other Ambulatory Visit: Payer: Self-pay | Admitting: Internal Medicine

## 2011-04-07 ENCOUNTER — Other Ambulatory Visit (INDEPENDENT_AMBULATORY_CARE_PROVIDER_SITE_OTHER): Payer: Medicare Other

## 2011-04-07 ENCOUNTER — Encounter: Payer: Self-pay | Admitting: Internal Medicine

## 2011-04-07 ENCOUNTER — Ambulatory Visit (INDEPENDENT_AMBULATORY_CARE_PROVIDER_SITE_OTHER): Payer: Medicare Other | Admitting: Internal Medicine

## 2011-04-07 VITALS — BP 130/80 | HR 78 | Temp 97.4°F | Ht 61.0 in | Wt 146.1 lb

## 2011-04-07 DIAGNOSIS — Z79899 Other long term (current) drug therapy: Secondary | ICD-10-CM

## 2011-04-07 DIAGNOSIS — Z23 Encounter for immunization: Secondary | ICD-10-CM

## 2011-04-07 DIAGNOSIS — Z0000 Encounter for general adult medical examination without abnormal findings: Secondary | ICD-10-CM

## 2011-04-07 DIAGNOSIS — F3289 Other specified depressive episodes: Secondary | ICD-10-CM

## 2011-04-07 DIAGNOSIS — F329 Major depressive disorder, single episode, unspecified: Secondary | ICD-10-CM

## 2011-04-07 LAB — URINALYSIS, ROUTINE W REFLEX MICROSCOPIC
Bilirubin Urine: NEGATIVE
Hgb urine dipstick: NEGATIVE
Ketones, ur: NEGATIVE
Leukocytes, UA: NEGATIVE
Nitrite: NEGATIVE
Specific Gravity, Urine: 1.01 (ref 1.000–1.030)
Total Protein, Urine: NEGATIVE
Urine Glucose: NEGATIVE
Urobilinogen, UA: 0.2 (ref 0.0–1.0)
pH: 6 (ref 5.0–8.0)

## 2011-04-07 LAB — CBC WITH DIFFERENTIAL/PLATELET
Basophils Absolute: 0 10*3/uL (ref 0.0–0.1)
Basophils Relative: 0.1 % (ref 0.0–3.0)
Eosinophils Absolute: 0.2 10*3/uL (ref 0.0–0.7)
Eosinophils Relative: 2.3 % (ref 0.0–5.0)
HCT: 39.3 % (ref 36.0–46.0)
Hemoglobin: 13.5 g/dL (ref 12.0–15.0)
Lymphocytes Relative: 28.5 % (ref 12.0–46.0)
Lymphs Abs: 2.5 10*3/uL (ref 0.7–4.0)
MCHC: 34.3 g/dL (ref 30.0–36.0)
MCV: 95.9 fl (ref 78.0–100.0)
Monocytes Absolute: 0.9 10*3/uL (ref 0.1–1.0)
Monocytes Relative: 10.9 % (ref 3.0–12.0)
Neutro Abs: 5 10*3/uL (ref 1.4–7.7)
Neutrophils Relative %: 58.2 % (ref 43.0–77.0)
Platelets: 252 10*3/uL (ref 150.0–400.0)
RBC: 4.1 Mil/uL (ref 3.87–5.11)
RDW: 13.5 % (ref 11.5–14.6)
WBC: 8.7 10*3/uL (ref 4.5–10.5)

## 2011-04-07 LAB — BASIC METABOLIC PANEL
BUN: 10 mg/dL (ref 6–23)
CO2: 28 mEq/L (ref 19–32)
Calcium: 9.4 mg/dL (ref 8.4–10.5)
Chloride: 104 mEq/L (ref 96–112)
Creatinine, Ser: 0.9 mg/dL (ref 0.4–1.2)
GFR: 68.36 mL/min (ref >60.00)
Glucose, Bld: 96 mg/dL (ref 70–99)
Potassium: 4.1 mEq/L (ref 3.5–5.1)
Sodium: 140 mEq/L (ref 135–145)

## 2011-04-07 LAB — LIPID PANEL
Cholesterol: 187 mg/dL (ref 0–200)
HDL: 81.8 mg/dL (ref >39.00)
LDL Cholesterol: 82 mg/dL (ref 0–99)
Total CHOL/HDL Ratio: 2
Triglycerides: 114 mg/dL (ref 0.0–149.0)
VLDL: 22.8 mg/dL (ref 0.0–40.0)

## 2011-04-07 LAB — HEPATIC FUNCTION PANEL
ALT: 24 U/L (ref 0–35)
AST: 25 U/L (ref 0–37)
Albumin: 3.8 g/dL (ref 3.5–5.2)
Alkaline Phosphatase: 43 U/L (ref 39–117)
Bilirubin, Direct: 0.1 mg/dL (ref 0.0–0.3)
Total Bilirubin: 0.5 mg/dL (ref 0.3–1.2)
Total Protein: 7.5 g/dL (ref 6.0–8.3)

## 2011-04-07 LAB — TSH: TSH: 3.54 u[IU]/mL (ref 0.35–5.50)

## 2011-04-07 MED ORDER — METOCLOPRAMIDE HCL 10 MG PO TABS: ORAL_TABLET | ORAL | Status: DC

## 2011-04-07 MED ORDER — FLUOXETINE HCL 10 MG PO CAPS: 10.0000 mg | ORAL_CAPSULE | Freq: Every day | ORAL | Status: DC

## 2011-04-07 NOTE — Assessment & Plan Note (Signed)

## 2011-04-07 NOTE — Patient Instructions (Signed)
Take all new medications as prescribed Please call in 1 month if you feel you need an increase of the generic prozac Continue all other medications as before, except ok to stop the zoloft as you have You had the flu shot today Please go to LAB in the Basement for the blood and/or urine tests to be done today Please call the phone number [phone] (the [employer] System) for results of testing in 2-3 days;  When calling, simply dial the number, and when prompted enter the MRN number above (the Medical Record Number) and the # key, then the message should start. Please return in 6 months, or sooner if needed

## 2011-04-07 NOTE — Progress Notes (Signed)
Subjective:    Patient ID: Laura Barnes, female    DOB: [birth year], 70 y.o.   MRN: [id number]  HPI  Here for wellness and f/u;  Overall doing ok;  Pt denies CP, worsening SOB, DOE, wheezing, orthopnea, PND, worsening LE edema, palpitations, dizziness or syncope.  Pt denies neurological change such as new Headache, facial or extremity weakness.  Pt denies polydipsia, polyuria, or low sugar symptoms. Pt states overall good compliance with treatment and medications, good tolerability, and trying to follow lower cholesterol diet.  Pt denies worsening depressive symptoms, suicidal ideation or panic. No fever, wt loss, night sweats, loss of appetite, or other constitutional symptoms.  Pt states good ability with ADL's, low fall risk, home safety reviewed and adequate, no significant changes in hearing or vision, and occasionally active with exercise.  Did have to quit the zoloft due to fatigue/draggy feeling.  Wellbutrin alone not working well with incrased depressive symtpoms, low motivation and does not want to take the lexparo again (though worked well) due to wt gain, and was more active at that point.   Past Medical History  Diagnosis Date  . HYPERLIPIDEMIA 01/23/2007  . ANXIETY 01/23/2007  . DEPRESSION 01/23/2007  . MIGRAINE, COMMON 04/02/2010  . MIGRAINE HEADACHE 01/23/2007  . VENOUS INSUFFICIENCY, CHRONIC 04/02/2010  . SINUSITIS- ACUTE-NOS 07/19/2007  . ALLERGIC RHINITIS 01/23/2007  . GERD 01/23/2007  . IBS 01/23/2007  . VAGINITIS 03/20/2008  . SKIN LESION 04/02/2010  . Enthesopathy of hip region 07/19/2007  . FOOT PAIN, BILATERAL 04/02/2010  . PLANTAR FASCIITIS 01/23/2007  . OSTEOPOROSIS 07/19/2007  . OSTEOPENIA 04/02/2010  . RASH-NONVESICULAR 03/20/2008  . NECK MASS 04/01/2009   Past Surgical History  Procedure Date  . Appendectomy   . Cholecystectomy   . Abdominal hysterectomy   . 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 Oophorectomy   . Right shoulder  surgury   . S/p neck lump removal     reports that she has never smoked. She does not have any smokeless tobacco history on file. She reports that she does not drink alcohol or use illicit drugs. family history includes Cancer in her maternal grandmother; Diabetes in her mother, other, and sister; and Heart disease in her other. Allergies  Allergen Reactions  . Aspirin   . Doxycycline   . Erythromycin   . Hydrocodone     REACTION: itch  . Hydrocodone-Acetaminophen   . BJY:NWGNFAOZHYQ+MVHQIONGE+XBMWUXLKGM Acid+Aspartame   . Methylphenidate Hcl   . Mirtazapine   . Nsaids     REACTION: stomach bleeding per pt  . Prednisone   . Rofecoxib   . Sertraline Hcl   . Sulfonamide Derivatives   . Sumatriptan    Current Outpatient Prescriptions on File Prior to Visit  Medication Sig Dispense Refill  . alendronate (FOSAMAX) 70 MG tablet TAKE 1 TABLET BY MOUTH ONCE WEEKLY  12 tablet  1  . ALPRAZolam (XANAX) 0.5 MG tablet Take 1 tablet (0.5 mg total) by mouth 2 (two) times daily as needed. Please make return office visit for further refills  60 tablet  5  . buPROPion (WELLBUTRIN XL) 300 MG 24 hr tablet Take 300 mg by mouth daily.        . butalbital-acetaminophen-caffeine (FIORICET, ESGIC) 50-325-40 MG per tablet TAKE 1 TABLET BY MOUTH THREE TIMES A WEEK AS NEEDED FOR HEADACHE  15 tablet  2  . estradiol (ESTRACE) 2 MG tablet Take 2 mg by mouth daily.        Marland Kitchen  fexofenadine (ALLEGRA) 180 MG tablet Take 180 mg by mouth daily.        . pantoprazole (PROTONIX) 40 MG tablet Take 40 mg by mouth daily.        Marland Kitchen triamcinolone (KENALOG) 0.5 % cream Apply topically 2 (two) times daily.        Marland Kitchen triamterene-hydrochlorothiazide (MAXZIDE-25) 37.5-25 MG per tablet 1/2 - 1 by mouth once daily as needed for swelling        Review of Systems Review of Systems  Constitutional: Negative for diaphoresis, activity change, appetite change and unexpected weight change.  HENT: Negative for hearing loss, ear pain,  facial swelling, mouth sores and neck stiffness.   Eyes: Negative for pain, redness and visual disturbance.  Respiratory: Negative for shortness of breath and wheezing.   Cardiovascular: Negative for chest pain and palpitations.  Gastrointestinal: Negative for diarrhea, blood in stool, abdominal distention and rectal pain.  Genitourinary: Negative for hematuria, flank pain and decreased urine volume.  Musculoskeletal: Negative for myalgias and joint swelling.  Skin: Negative for color change and wound.  Neurological: Negative for syncope and numbness.  Hematological: Negative for adenopathy.  Psychiatric/Behavioral: Negative for hallucinations, self-injury, decreased concentration and agitation.      Objective:   Physical Exam BP 130/80  Pulse 78  Temp(Src) 97.4 F (36.3 C) (Oral)  Ht 5\' 1"  (1.549 m)  Wt 146 lb 2 oz (66.282 kg)  BMI 27.61 kg/m2  SpO2 98% Physical Exam  VS noted Constitutional: Pt is oriented to person, place, and time. Appears well-developed and well-nourished.  HENT:  Head: Normocephalic and atraumatic.  Right Ear: External ear normal.  Left Ear: External ear normal.  Nose: Nose normal.  Mouth/Throat: Oropharynx is clear and moist.  Eyes: Conjunctivae and EOM are normal. Pupils are equal, round, and reactive to light.  Neck: Normal range of motion. Neck supple. No JVD present. No tracheal deviation present.  Cardiovascular: Normal rate, regular rhythm, normal heart sounds and intact distal pulses.   Pulmonary/Chest: Effort normal and breath sounds normal.  Abdominal: Soft. Bowel sounds are normal. There is no tenderness.  Musculoskeletal: Normal range of motion. Exhibits no edema.  Lymphadenopathy:  Has no cervical adenopathy.  Neurological: Pt is alert and oriented to person, place, and time. Pt has normal reflexes. No cranial nerve deficit.  Skin: Skin is warm and dry. No rash noted.  Psychiatric:  Has  normal mood and affect. Behavior is normal. +  depressed affect    Assessment & Plan:

## 2011-04-12 ENCOUNTER — Encounter: Payer: Self-pay | Admitting: Internal Medicine

## 2011-04-12 NOTE — Assessment & Plan Note (Signed)
Mild to mod, for prozac asd,  to f/u any worsening symptoms or concerns, verified nonsuicidal, declines counseling at this time

## 2011-04-21 ENCOUNTER — Other Ambulatory Visit: Payer: Self-pay

## 2011-04-21 MED ORDER — TRIAMTERENE-HCTZ 37.5-25 MG PO TABS: 1.0000 | ORAL_TABLET | Freq: Every day | ORAL | Status: DC

## 2011-04-21 MED ORDER — PANTOPRAZOLE SODIUM 40 MG PO TBEC: 40.0000 mg | DELAYED_RELEASE_TABLET | Freq: Every day | ORAL | Status: DC

## 2011-05-07 ENCOUNTER — Other Ambulatory Visit: Payer: Self-pay

## 2011-05-07 MED ORDER — BUPROPION HCL ER (XL) 300 MG PO TB24: 300.0000 mg | ORAL_TABLET | Freq: Every day | ORAL | Status: DC

## 2011-05-29 ENCOUNTER — Other Ambulatory Visit: Payer: Self-pay | Admitting: *Deleted

## 2011-05-29 MED ORDER — ALPRAZOLAM 0.5 MG PO TABS: 0.5000 mg | ORAL_TABLET | Freq: Two times a day (BID) | ORAL | Status: DC | PRN

## 2011-05-29 NOTE — Telephone Encounter (Signed)
Done hardcopy to robin  

## 2011-05-29 NOTE — Telephone Encounter (Signed)
R'cd fax from [facility] for refill of Alprazolam-last written 11/14/2010 #60 with 5 refills-please advise

## 2011-06-01 NOTE — Telephone Encounter (Signed)
Faxed hardcopy to [facility] [address] [place] 

## 2011-07-10 ENCOUNTER — Other Ambulatory Visit: Payer: Self-pay | Admitting: Internal Medicine

## 2011-09-10 ENCOUNTER — Other Ambulatory Visit: Payer: Self-pay | Admitting: Internal Medicine

## 2011-09-12 ENCOUNTER — Other Ambulatory Visit: Payer: Self-pay | Admitting: Internal Medicine

## 2011-09-18 ENCOUNTER — Other Ambulatory Visit: Payer: Self-pay | Admitting: Internal Medicine

## 2011-09-21 ENCOUNTER — Other Ambulatory Visit: Payer: Self-pay | Admitting: Internal Medicine

## 2011-09-22 ENCOUNTER — Other Ambulatory Visit: Payer: Self-pay | Admitting: Internal Medicine

## 2011-10-05 ENCOUNTER — Encounter: Payer: Self-pay | Admitting: Internal Medicine

## 2011-10-05 ENCOUNTER — Telehealth: Payer: Self-pay | Admitting: Gastroenterology

## 2011-10-05 ENCOUNTER — Ambulatory Visit (INDEPENDENT_AMBULATORY_CARE_PROVIDER_SITE_OTHER): Payer: Medicare Other | Admitting: Internal Medicine

## 2011-10-05 VITALS — BP 120/80 | HR 80 | Temp 98.0°F | Ht 61.0 in | Wt 144.2 lb

## 2011-10-05 DIAGNOSIS — F3289 Other specified depressive episodes: Secondary | ICD-10-CM

## 2011-10-05 DIAGNOSIS — J019 Acute sinusitis, unspecified: Secondary | ICD-10-CM | POA: Insufficient documentation

## 2011-10-05 DIAGNOSIS — Z0000 Encounter for general adult medical examination without abnormal findings: Secondary | ICD-10-CM

## 2011-10-05 DIAGNOSIS — F329 Major depressive disorder, single episode, unspecified: Secondary | ICD-10-CM

## 2011-10-05 DIAGNOSIS — G43909 Migraine, unspecified, not intractable, without status migrainosus: Secondary | ICD-10-CM

## 2011-10-05 DIAGNOSIS — F411 Generalized anxiety disorder: Secondary | ICD-10-CM

## 2011-10-05 MED ORDER — TRIAMTERENE-HCTZ 37.5-25 MG PO TABS: 1.0000 | ORAL_TABLET | Freq: Every day | ORAL | Status: DC

## 2011-10-05 MED ORDER — TRIAMCINOLONE ACETONIDE 0.5 % EX CREA: TOPICAL_CREAM | Freq: Two times a day (BID) | CUTANEOUS | Status: DC

## 2011-10-05 MED ORDER — TOPIRAMATE 50 MG PO TABS: 50.0000 mg | ORAL_TABLET | Freq: Two times a day (BID) | ORAL | Status: DC

## 2011-10-05 MED ORDER — PANTOPRAZOLE SODIUM 40 MG PO TBEC: 40.0000 mg | DELAYED_RELEASE_TABLET | Freq: Every day | ORAL | Status: DC

## 2011-10-05 MED ORDER — ESTRADIOL 2 MG PO TABS: 2.0000 mg | ORAL_TABLET | Freq: Every day | ORAL | Status: DC

## 2011-10-05 MED ORDER — CITALOPRAM HYDROBROMIDE 10 MG PO TABS: 10.0000 mg | ORAL_TABLET | Freq: Every day | ORAL | Status: DC

## 2011-10-05 MED ORDER — LEVOFLOXACIN 250 MG PO TABS: 250.0000 mg | ORAL_TABLET | Freq: Every day | ORAL | Status: AC

## 2011-10-05 MED ORDER — FEXOFENADINE HCL 180 MG PO TABS: 180.0000 mg | ORAL_TABLET | Freq: Every day | ORAL | Status: DC

## 2011-10-05 MED ORDER — METOCLOPRAMIDE HCL 10 MG PO TABS: ORAL_TABLET | ORAL | Status: DC

## 2011-10-05 MED ORDER — ALENDRONATE SODIUM 70 MG PO TABS: 70.0000 mg | ORAL_TABLET | ORAL | Status: DC

## 2011-10-05 MED ORDER — BUPROPION HCL ER (XL) 300 MG PO TB24: 300.0000 mg | ORAL_TABLET | Freq: Every day | ORAL | Status: DC

## 2011-10-05 NOTE — Assessment & Plan Note (Signed)
Some increased off the prozac, to cont the xanax prn,  to f/u any worsening symptoms or concerns, decines counseling

## 2011-10-05 NOTE — Assessment & Plan Note (Signed)
Uncontrolled, for topamax re-start, and refer to neurology as pt does not plan to return to [facility],  to f/u any worsening symptoms or concerns

## 2011-10-05 NOTE — Assessment & Plan Note (Signed)
To add citalopram 10 qd, verified nonsuicidal, declines counseling,  to f/u any worsening symptoms or concerns

## 2011-10-05 NOTE — Assessment & Plan Note (Signed)
Mild to mod, for antibx course,  to f/u any worsening symptoms or concerns 

## 2011-10-05 NOTE — Patient Instructions (Addendum)
Take all new medications as prescribed  - the antibiotic, topamax, and the citalopram 10 mg for stress Start the topamax at HALF of a 50 mg pill at night for 2 nights, then whole pill at night for 2 nights, then HALF pill in the am as well as the whole pill at night for 2 days, then 1 pill twice per day after that Continue all other medications as before, except ok to stop the prozac and fioricet as you have done You are given all the hardcopy refills today You will be contacted regarding the referral for: Dr Lewit/neurology No need for further blood work or other testing today Please continue your efforts at being more active, low cholesterol diet, and weight control. Please return in 6 mo with Lab testing done 3-5 days before

## 2011-10-05 NOTE — Progress Notes (Signed)
Subjective:    Patient ID: Laura Barnes, female    DOB: 26-Apr-1941, 71 y.o.   MRN: [id number]  HPI   Here with 3 days acute onset fever, facial pain, pressure, general weakness and malaise, and greenish d/c, with slight ST, but little to no cough and Pt denies chest pain, increased sob or doe, wheezing, orthopnea, PND, increased LE swelling, palpitations, dizziness or syncope. Also,  Here with recurrent migraine, has been seeing [facility] but next step is botox shots, not covered by ins - about $2400, no guarantee of success.  Cannot take triptans.  Has taken topamax in the past, may have been taken off with ? Memory difficulty, but not working now, and would to re-start.  Has been in bed for 3-4 days at a time. Fioricet not working.  Has mild worsening depressive symptoms, but no suicidal ideation, or panic, and has ongoing anxiety,  increased recently off the prozac.   Past Medical History  Diagnosis Date  . HYPERLIPIDEMIA 01/23/2007  . ANXIETY 01/23/2007  . DEPRESSION 01/23/2007  . MIGRAINE, COMMON 04/02/2010  . MIGRAINE HEADACHE 01/23/2007  . VENOUS INSUFFICIENCY, CHRONIC 04/02/2010  . SINUSITIS- ACUTE-NOS 07/19/2007  . ALLERGIC RHINITIS 01/23/2007  . GERD 01/23/2007  . IBS 01/23/2007  . VAGINITIS 03/20/2008  . SKIN LESION 04/02/2010  . Enthesopathy of hip region 07/19/2007  . FOOT PAIN, BILATERAL 04/02/2010  . PLANTAR FASCIITIS 01/23/2007  . OSTEOPOROSIS 07/19/2007  . OSTEOPENIA 04/02/2010  . RASH-NONVESICULAR 03/20/2008  . NECK MASS 04/01/2009   Past Surgical History  Procedure Date  . Appendectomy   . Cholecystectomy   . Abdominal hysterectomy   . 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 Oophorectomy   . Right shoulder surgury   . S/p neck lump removal     reports that she has never smoked. She does not have any smokeless tobacco history on file. She reports that she does not drink alcohol or use illicit drugs. family history includes Cancer in her  maternal grandmother; Diabetes in her mother, other, and sister; and Heart disease in her other. Allergies  Allergen Reactions  . Amoxicillin-Pot Clavulanate   . Aspirin   . Doxycycline   . Erythromycin   . Hydrocodone     REACTION: itch  . Hydrocodone-Acetaminophen   . Methylphenidate Hcl   . Mirtazapine   . Nsaids     REACTION: stomach bleeding per pt  . Prednisone   . Rofecoxib   . Sertraline Hcl   . Sulfonamide Derivatives   . Sumatriptan    Review of Systems Review of Systems  Constitutional: Negative for diaphoresis and unexpected weight change.  HENT: Negative for drooling and tinnitus.   Respiratory: Negative for choking and stridor.   Gastrointestinal: Negative for vomiting and blood in stool.  Genitourinary: Negative for hematuria and decreased urine volume.  Musculoskeletal: Negative for gait problem.  Skin: Negative for color change and wound.  Neurological: Negative for tremors and numbness.     Objective:   Physical Exam BP 120/80  Pulse 80  Temp(Src) 98 F (36.7 C) (Oral)  Ht 5\' 1"  (1.549 m)  Wt 144 lb 4 oz (65.431 kg)  BMI 27.26 kg/m2  SpO2 98% Physical Exam  VS noted, mild ill Constitutional: Pt appears well-developed and well-nourished.  HENT: Head: Normocephalic.  Right Ear: External ear normal.  Left Ear: External ear normal.  Bilat tm's mild erythema.  Sinus tender bilat.  Pharynx mild erythema Eyes: Conjunctivae and EOM  are normal. Pupils are equal, round, and reactive to light.  Neck: Normal range of motion. Neck supple.  Cardiovascular: Normal rate and regular rhythm.   Pulmonary/Chest: Effort normal and breath sounds normal.  Neurological: Pt is alert. Not confused, motor/gait intact Skin: Skin is warm. No erythema. No rash Psychiatric: Pt behavior is normal. Thought content normal.+ marked dysphoric, and 1-2+ nervous     Assessment & Plan:

## 2011-10-06 NOTE — Telephone Encounter (Signed)
Informed pt Dr Jarold Motto suggested she switch to Domperidone. Informed her it is not approved and we order the drug from [place]; not covered by insurance. Pt wanted to know what s&s she will have if she comes off the drug and I informed her she may get constipated, may get nausea because food is sittiing in her stomach and she may feel full sooner than normal. Pt will stop the reglan.

## 2011-10-06 NOTE — Telephone Encounter (Signed)
According to [facility], we have not seen the pt since 08/05/2006. Pt went to Dr Jonny Ruiz and he asked why she is still taking Reglan and he informed her the drug has bad SE; Dr Raphael Gibney office has been refilling the med. Pt reports she's been on the med for more than 10 years. Explained to pt I will have to get her chart. Sometimes the med is used for nausea in pt where her stomach doesn't empty fast enough. I will call her back.

## 2011-10-06 NOTE — Telephone Encounter (Signed)
Dr Jarold Motto, I'm putting pt's  Chart on your desk. Please review and advise or should pt come in to discuss? Thanks.

## 2011-10-06 NOTE — Telephone Encounter (Signed)
Change to Domperidome is ok

## 2011-10-13 ENCOUNTER — Telehealth: Payer: Self-pay | Admitting: *Deleted

## 2011-10-13 NOTE — Telephone Encounter (Signed)
Pt wanted Dr Jarold Motto aware she stopped the Reglan for 2 days and her old S&S returned; Nausea and Stomach/abdominal pain. Pt has decided she must stay on the drug for her quality of life. She doesn't feel her r hand tremor is so bad she has to sacrifice stopping the reglan and she's afraid of the domperidone.

## 2011-10-13 NOTE — Telephone Encounter (Signed)
Informed pt Dr Jarold Motto would like to see her to discuss the reglan, etc; pt scheduled for 10/29/11.

## 2011-10-13 NOTE — Telephone Encounter (Signed)
She needs to see me first 

## 2011-10-28 ENCOUNTER — Encounter: Payer: Self-pay | Admitting: *Deleted

## 2011-10-29 ENCOUNTER — Other Ambulatory Visit (INDEPENDENT_AMBULATORY_CARE_PROVIDER_SITE_OTHER): Payer: Medicare Other

## 2011-10-29 ENCOUNTER — Encounter: Payer: Self-pay | Admitting: Gastroenterology

## 2011-10-29 ENCOUNTER — Ambulatory Visit (INDEPENDENT_AMBULATORY_CARE_PROVIDER_SITE_OTHER): Payer: Medicare Other | Admitting: Gastroenterology

## 2011-10-29 DIAGNOSIS — R110 Nausea: Secondary | ICD-10-CM

## 2011-10-29 DIAGNOSIS — R109 Unspecified abdominal pain: Secondary | ICD-10-CM

## 2011-10-29 DIAGNOSIS — K5900 Constipation, unspecified: Secondary | ICD-10-CM

## 2011-10-29 DIAGNOSIS — K3184 Gastroparesis: Secondary | ICD-10-CM

## 2011-10-29 DIAGNOSIS — K5909 Other constipation: Secondary | ICD-10-CM

## 2011-10-29 DIAGNOSIS — Z79899 Other long term (current) drug therapy: Secondary | ICD-10-CM

## 2011-10-29 LAB — IBC PANEL
Iron: 54 ug/dL (ref 42–145)
Saturation Ratios: 15.9 % — ABNORMAL LOW (ref 20.0–50.0)
Transferrin: 242.6 mg/dL (ref 212.0–360.0)

## 2011-10-29 LAB — FOLATE: Folate: >24.8 ng/mL (ref >5.9)

## 2011-10-29 LAB — FERRITIN: Ferritin: 98.9 ng/mL (ref 10.0–291.0)

## 2011-10-29 LAB — VITAMIN B12: Vitamin B-12: 822 pg/mL (ref 211–911)

## 2011-10-29 MED ORDER — MOVIPREP 100 G PO SOLR: 1.0000 | Freq: Once | ORAL | Status: DC

## 2011-10-29 MED ORDER — AMBULATORY NON FORMULARY MEDICATION: Status: DC

## 2011-10-29 NOTE — Patient Instructions (Signed)
Your procedure has been scheduled for 11/16/2011, please follow the seperate instructions.  Movi prep has been sent to your local pharmacy. Stop your Reglan. Take Domperidone one tablet by mouth three times a day with meals.  Your Domperidone has been sent to [facility] [place] [phone]. Please go to the basement today for your labs.

## 2011-10-29 NOTE — Progress Notes (Signed)
History of Present Illness:  This is a 71 year old Caucasian female with chronic migraine headaches and depression. She also has had many years of gastroparesis documented by abnormal gastric emptying scans, and has done well on Reglan 10 mg twice a day and Protonix 40 mg a day.. Metoclopramide was discontinued by primary care several weeks ago when she has developed epigastric abdominal pain with nausea but no emesis. Patient chronically is on Protonix 40 mg a day. She has had no anorexia, weight loss, or specific hepatobiliary complaints. She does complain of chronic functional constipation, but denies melena or hematochezia. Colonoscopy was 10 years ago and was normal, and endoscopy normal in 2006. The patient denies use of NSAIDs or salicylates,  abuse of alcohol or cigarettes. Her epigastric pain is described as a dull discomfort sometimes better with food, sometimes worse with food, and it does radiate apparently into her back. There is no history of documented hepatitis or pancreatitis. She is status post cholecystectomy many years ago.  I have reviewed this patient's present history, medical and surgical past history, allergies and medications.     ROS: The remainder of the 10 point ROS is negative... no history of Raynaud's phenomenon. She does have a very fine minor tremor, also occasional numbness in her feet. She denies claudications or any history of peripheral vascular disease, and denies a current cardiovascular or pulmonary symptomatology, bladder emptying problems, or other neuromuscular problems. Review of her labs shows no specific abnormalities. She does have osteoporosis and is on Fosamax, estradiol, but apparently not on calcium or vitamin D.     Physical Exam: Blood pressure 120/74, pulse 64 and regular, weight 140 pounds the BMI of 26.57. General well developed well nourished patient in no acute distress, appearing her stated age Eyes PERRLA, no icterus, fundoscopic exam per  opthamologist Skin no lesions noted Neck supple, no adenopathy, no thyroid enlargement, no tenderness Chest clear to percussion and auscultation Heart no significant murmurs, gallops or rubs noted Abdomen no hepatosplenomegaly masses or tenderness, BS normal. I cannot appreciate a succussion splash. Rectal inspection normal no fissures, or fistulae noted.  No masses or tenderness on digital exam. Stool guaiac negative. Extremities no acute joint lesions, edema, phlebitis or evidence of cellulitis. Peripheral pulses are intact, and there is no edema or phlebitis. Neuromuscular function appears to be normal. I cannot appreciate a significant tremor on exam today. Neurologic patient oriented x 3, cranial nerves intact, no focal neurologic deficits noted. Psychological mental status normal and normal affect.  Assessment and plan: Recurrent epigastric abdominal pain in a 71 year old patient with documented gastroparesis. Her prokinetic agents were recently discontinued for suspected possible neuropathic side effects, and she promptly had recurrent nausea and epigastric abdominal pain, and she resumed her Reglan 10 mg twice a day. She is status post cholecystectomy, and has no hepatobiliary complaints. I've schedule her for endoscopic exam, we will check for H. pylori, also she needs followup colonoscopy exam. I will change her to domperidone 10 mg 3 times a day before meals, and discontinue her Reglan per recognized  neurological side effects from chronic use. We discussed this in detail, and she is related that if she has to, she will continue Reglan if domperidone does not work. Anemia profile was ordered for review.  No diagnosis found.

## 2011-11-13 ENCOUNTER — Other Ambulatory Visit: Payer: Self-pay | Admitting: Neurology

## 2011-11-13 DIAGNOSIS — R519 Headache, unspecified: Secondary | ICD-10-CM

## 2011-11-16 ENCOUNTER — Ambulatory Visit (AMBULATORY_SURGERY_CENTER): Payer: Medicare Other | Admitting: Gastroenterology

## 2011-11-16 ENCOUNTER — Encounter: Payer: Self-pay | Admitting: Gastroenterology

## 2011-11-16 VITALS — BP 153/76 | HR 85 | Temp 97.8°F | Resp 16 | Ht 61.0 in | Wt 140.0 lb

## 2011-11-16 DIAGNOSIS — R109 Unspecified abdominal pain: Secondary | ICD-10-CM

## 2011-11-16 DIAGNOSIS — K5900 Constipation, unspecified: Secondary | ICD-10-CM

## 2011-11-16 DIAGNOSIS — D126 Benign neoplasm of colon, unspecified: Secondary | ICD-10-CM

## 2011-11-16 DIAGNOSIS — R110 Nausea: Secondary | ICD-10-CM

## 2011-11-16 DIAGNOSIS — Z1211 Encounter for screening for malignant neoplasm of colon: Secondary | ICD-10-CM

## 2011-11-16 DIAGNOSIS — K219 Gastro-esophageal reflux disease without esophagitis: Secondary | ICD-10-CM

## 2011-11-16 DIAGNOSIS — K3184 Gastroparesis: Secondary | ICD-10-CM

## 2011-11-16 MED ORDER — SODIUM CHLORIDE 0.9 % IV SOLN: 500.0000 mL | INTRAVENOUS | Status: DC

## 2011-11-16 NOTE — Op Note (Signed)
[facility] [address] [place], [place]  [address]  COLONOSCOPY PROCEDURE REPORT  PATIENT:  Laura, Barnes  MR#:  [id number] BIRTHDATE:  Jun 19, 1940, 70 yrs. old  GENDER:  female ENDOSCOPIST:  Vania Rea. Jarold Motto, MD, [facility] REF. BY: PROCEDURE DATE:  11/16/2011 PROCEDURE:  Colonoscopy with snare polypectomy ASA CLASS:  Class III INDICATIONS:  Routine Risk Screening MEDICATIONS:   propofol (Diprivan) 220 mg IV  DESCRIPTION OF PROCEDURE:   After the risks and benefits and of the procedure were explained, informed consent was obtained. Digital rectal exam was performed and revealed no abnormalities. The LB CF-[id number] endoscope was introduced through the anus and advanced to the cecum, which was identified by both the appendix and ileocecal valve.  The quality of the prep was adequate, using MoviPrep.  The instrument was then slowly withdrawn as the colon was fully examined. <<PROCEDUREIMAGES>>  FINDINGS:  A sessile polyp was found at the hepatic flexure. 5mm flat right colon polyp hot snare removed  This was otherwise a normal examination of the colon.   Retroflexed views in the rectum revealed no abnormalities.    The scope was then withdrawn from the patient and the procedure completed.  COMPLICATIONS:  None ENDOSCOPIC IMPRESSION: 1) Sessile polyp at the hepatic flexure 2) Otherwise normal examination long and redundant colon noted RECOMMENDATIONS: 1) Repeat colonoscopy in 5 years if polyp adenomatous; otherwise 10 years 2) Continue current medications  REPEAT EXAM:  No  ______________________________ Vania Rea. Jarold Motto, MD, [facility]  CC:  Corwin Levins, MD  n. Rosalie DoctorMarland Kitchen   [name] at 11/16/2011 02:23 PM  [place]iasc[place], Liborio Nixon, [phone]

## 2011-11-16 NOTE — Progress Notes (Signed)
Patient did not experience any of the following events: a burn prior to discharge; a fall within the facility; wrong site/side/patient/procedure/implant event; or a hospital transfer or hospital admission upon discharge from the facility. (G8907) Patient did not have preoperative order for IV antibiotic SSI prophylaxis. (G8918)  

## 2011-11-16 NOTE — Op Note (Signed)
[facility] [address] [place], [place]  [address]  ENDOSCOPY PROCEDURE REPORT  PATIENT:  Laura Barnes, Laura Barnes  MR#:  [id number] BIRTHDATE:  Sep 16, 1940, 70 yrs. old  GENDER:  female  ENDOSCOPIST:  Vania Rea. Jarold Motto, MD, [facility] Referred by:  PROCEDURE DATE:  11/16/2011 PROCEDURE:  EGD, diagnostic 19147 ASA CLASS:  Class III INDICATIONS:  GASTROPARESIS  MEDICATIONS:   There was residual sedation effect present from prior procedure., propofol (Diprivan) 100 mg IV TOPICAL ANESTHETIC:  DESCRIPTION OF PROCEDURE:   After the risks and benefits of the procedure were explained, informed consent was obtained.  The LB GIF-H180 [id number] endoscope was introduced through the mouth and advanced to the second portion of the duodenum.  The instrument was slowly withdrawn as the mucosa was fully examined. 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  Otherwise normal esophagus. 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  Otherwise normal stomach. RETAINED FOOD IN STOMACH NOTED.    Retroflexed views revealed no abnormalities.    The scope was then withdrawn from the patient and the procedure completed.  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 RECOMMENDATIONS: DOMPERIDOME 10 MG TID AC AS PLANNES.  ______________________________ Vania Rea. Jarold Motto, MD, [facility]  CC:  Corwin Levins, MD  n. Rosalie DoctorMarland Kitchen   Vania Rea. Kailen Hinkle at 11/16/2011 02:31 PM  Rappa, Liborio Nixon, [phone]

## 2011-11-16 NOTE — Patient Instructions (Addendum)

## 2011-11-17 ENCOUNTER — Telehealth: Payer: Self-pay | Admitting: *Deleted

## 2011-11-17 NOTE — Telephone Encounter (Signed)
  Follow up Call-  Call back number 11/16/2011  Post procedure Call Back phone  # [phone]  Permission to leave phone message Yes     Patient questions:  Do you have a fever, pain , or abdominal swelling? no Pain Score  0 *  Have you tolerated food without any problems? yes  Have you been able to return to your normal activities? yes  Do you have any questions about your discharge instructions: Diet   no Medications  no Follow up visit  no  Do you have questions or concerns about your Care? no  Actions: * If pain score is 4 or above: No action needed, pain <4.

## 2011-11-20 ENCOUNTER — Encounter: Payer: Self-pay | Admitting: Gastroenterology

## 2011-11-21 ENCOUNTER — Ambulatory Visit
Admission: RE | Admit: 2011-11-21 | Discharge: 2011-11-21 | Disposition: A | Discharge status: Home / Self Care | Payer: Medicare Other | Source: Ambulatory Visit | Attending: Neurology | Admitting: Neurology

## 2011-11-21 DIAGNOSIS — R519 Headache, unspecified: Secondary | ICD-10-CM

## 2011-12-09 ENCOUNTER — Other Ambulatory Visit: Payer: Self-pay

## 2011-12-09 MED ORDER — BUPROPION HCL ER (XL) 300 MG PO TB24: 300.0000 mg | ORAL_TABLET | Freq: Every day | ORAL | Status: DC

## 2011-12-10 ENCOUNTER — Other Ambulatory Visit: Payer: Self-pay

## 2011-12-10 MED ORDER — ALPRAZOLAM 0.5 MG PO TABS: 0.5000 mg | ORAL_TABLET | Freq: Two times a day (BID) | ORAL | Status: DC | PRN

## 2011-12-10 NOTE — Telephone Encounter (Signed)
Done hardcopy to robin  

## 2011-12-11 NOTE — Telephone Encounter (Signed)
Faxed hardcopy to pharmacy. 

## 2012-03-08 ENCOUNTER — Encounter: Payer: Self-pay | Admitting: Internal Medicine

## 2012-03-08 ENCOUNTER — Ambulatory Visit (INDEPENDENT_AMBULATORY_CARE_PROVIDER_SITE_OTHER): Payer: Medicare Other | Admitting: Internal Medicine

## 2012-03-08 VITALS — BP 114/72 | HR 84 | Temp 97.4°F | Ht 61.0 in | Wt 144.5 lb

## 2012-03-08 DIAGNOSIS — Z23 Encounter for immunization: Secondary | ICD-10-CM

## 2012-03-08 DIAGNOSIS — F411 Generalized anxiety disorder: Secondary | ICD-10-CM

## 2012-03-08 DIAGNOSIS — G43909 Migraine, unspecified, not intractable, without status migrainosus: Secondary | ICD-10-CM

## 2012-03-08 DIAGNOSIS — J019 Acute sinusitis, unspecified: Secondary | ICD-10-CM

## 2012-03-08 MED ORDER — PROMETHAZINE HCL 25 MG PO TABS: 25.0000 mg | ORAL_TABLET | Freq: Four times a day (QID) | ORAL | Status: DC | PRN

## 2012-03-08 MED ORDER — CEPHALEXIN 500 MG PO CAPS: 500.0000 mg | ORAL_CAPSULE | Freq: Four times a day (QID) | ORAL | Status: DC

## 2012-03-08 MED ORDER — ESTRADIOL 2 MG PO TABS: 1.0000 mg | ORAL_TABLET | Freq: Every day | ORAL | Status: DC

## 2012-03-08 NOTE — Assessment & Plan Note (Signed)
Mild to mod, for antibx course,  to f/u any worsening symptoms or concerns, has numerous antibx allergy/intol - hopefully the cephalexin will help;  I suggested ENT referral for what sounds like more chronic sinusitis but she declines at this time

## 2012-03-08 NOTE — Progress Notes (Signed)
Subjective:    Patient ID: Laura Barnes, female    DOB: [birth year], 71 y.o.   MRN: [id number]  HPI   Here with 3 days acute onset fever, right facial pain, pressure, general weakness and malaise, and greenish d/c, with slight ST, but little to no cough and Pt denies chest pain, increased sob or doe, wheezing, orthopnea, PND, increased LE swelling, palpitations, dizziness or syncope.  Has actually had waxing and waning symptoms to left and right face alternating it seems for 3 months, but pain was too severe this time.  Pt denies new neurological symptoms such as new headache, or facial or extremity weakness or numbness   Pt denies polydipsia, polyuria.  Overall good compliance with treatment, and good medicine tolerability.  Also with mild onset migraine but decines specific change or other tx for that today. Denies worsening depressive symptoms, suicidal ideation, or panic Has appt coming up next mo as well with labs Past Medical History  Diagnosis Date  . HYPERLIPIDEMIA   . ANXIETY   . DEPRESSION   . MIGRAINE, COMMON   . MIGRAINE HEADACHE   . VENOUS INSUFFICIENCY, CHRONIC   . SINUSITIS- ACUTE-NOS   . ALLERGIC RHINITIS   . GERD   . IBS   . VAGINITIS   . SKIN LESION   . Enthesopathy of hip region   . FOOT PAIN, BILATERAL   . PLANTAR FASCIITIS   . OSTEOPOROSIS   . OSTEOPENIA   . RASH-NONVESICULAR   . NECK MASS   . Hiatal hernia    Past Surgical History  Procedure Date  . Appendectomy   . Cholecystectomy   . Abdominal hysterectomy   . 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 Oophorectomy   . Shoulder surgery   . S/p neck lump removal   . Breast enhancement surgery     reports that she has never smoked. She has never used smokeless tobacco. She reports that she does not drink alcohol or use illicit drugs. family history includes Breast cancer in her maternal grandmother; Diabetes in her mother, other, and sister; Heart disease in her maternal  aunt and maternal uncle; and Kidney disease in her paternal uncle. Allergies  Allergen Reactions  . Amoxicillin-Pot Clavulanate   . Aspirin   . Doxycycline   . Erythromycin   . Hydrocodone     REACTION: itch  . Hydrocodone-Acetaminophen   . Levofloxacin     Headaches, GI upset  . Methylphenidate Hcl   . Mirtazapine   . Nsaids     REACTION: stomach bleeding per pt  . Prednisone   . Rofecoxib   . Sertraline Hcl   . Sulfonamide Derivatives   . Sumatriptan    Current Outpatient Prescriptions on File Prior to Visit  Medication Sig Dispense Refill  . ALPRAZolam (XANAX) 0.5 MG tablet Take 1 tablet (0.5 mg total) by mouth 2 (two) times daily as needed.  60 tablet  5  . AMBULATORY NON FORMULARY MEDICATION Domperidone 10mg  take one tablet by mouth three times a day with meals  240 tablet  3  . buPROPion (WELLBUTRIN XL) 300 MG 24 hr tablet Take 1 tablet (300 mg total) by mouth daily.  90 tablet  3  . citalopram (CELEXA) 10 MG tablet Take 1 tablet (10 mg total) by mouth daily.  90 tablet  3  . fexofenadine (ALLEGRA) 180 MG tablet Take 1 tablet (180 mg total) by mouth daily.  90 tablet  3  .  gabapentin (NEURONTIN) 300 MG capsule       . pantoprazole (PROTONIX) 40 MG tablet Take 1 tablet (40 mg total) by mouth daily.  90 tablet  3  . topiramate (TOPAMAX) 50 MG tablet Take 1 tablet (50 mg total) by mouth 2 (two) times daily.  180 tablet  3  . triamcinolone cream (KENALOG) 0.5 % Apply topically 2 (two) times daily. As needed for rash  30 g  0  . triamterene-hydrochlorothiazide (MAXZIDE-25) 37.5-25 MG per tablet Take 1 each (1 tablet total) by mouth daily. 1/2 - 1 by mouth once daily as needed for swelling  90 tablet  3  . DISCONTD: estradiol (ESTRACE) 2 MG tablet Take 1 tablet (2 mg total) by mouth daily.  90 tablet  3  . alendronate (FOSAMAX) 70 MG tablet Take 1 tablet (70 mg total) by mouth every 7 (seven) days. Take with a full glass of water on an empty stomach.  12 tablet  3  . promethazine  (PHENERGAN) 25 MG tablet Take 1 tablet (25 mg total) by mouth every 6 (six) hours as needed for nausea.  40 tablet  1   Review of Systems  Constitutional: Negative for diaphoresis and unexpected weight change.  HENT: Negative for tinnitus.   Eyes: Pos for mild photophobia and visual disturbance.  Respiratory: Negative for choking and stridor.   Gastrointestinal: Negative for vomiting and blood in stool.  Genitourinary: Negative for hematuria and decreased urine volume.  Musculoskeletal: Negative for gait problem.  Skin: Negative for color change and wound.  Neurological: Negative for tremors and numbness.  Psychiatric/Behavioral: Negative for decreased concentration. The patient is not hyperactive.      Objective:   Physical Exam BP 114/72  Pulse 84  Temp 97.4 F (36.3 C) (Oral)  Ht 5\' 1"  (1.549 m)  Wt 144 lb 8 oz (65.545 kg)  BMI 27.30 kg/m2  SpO2 95% Physical Exam  VS noted Constitutional: Pt appears well-developed and well-nourished.  HENT: Head: Normocephalic.  Right Ear: External ear normal.  Left Ear: External ear normal.  Bilat tm's mild erythema.  Sinus tender right max >> left with mild swelling.  Pharynx mild erythema Eyes: Conjunctivae and EOM are normal. Pupils are equal, round, and reactive to light.  Neck: Normal range of motion. Neck supple.  Cardiovascular: Normal rate and regular rhythm.   Pulmonary/Chest: Effort normal and breath sounds normal.  Neurological: Pt is alert. Not confused , motor/gait intact Skin: Skin is warm. No erythema.  Psychiatric: Pt behavior is normal. Thought content normal  1+ nervous, ? dysphoric.     Assessment & Plan:

## 2012-03-08 NOTE — Patient Instructions (Addendum)
You had the flu shot today Take all new medications as prescribed  - the nausea medication, and antibiotic You can also take Delsym OTC for cough, and/or Mucinex (or it's generic off brand) for congestion

## 2012-03-08 NOTE — Assessment & Plan Note (Signed)
stable overall by hx and exam, most recent data reviewed with pt, and pt to continue medical treatment as before Lab Results  Component Value Date   WBC 8.7 04/07/2011   HGB 13.5 04/07/2011   HCT 39.3 04/07/2011   PLT 252.0 04/07/2011   GLUCOSE 96 04/07/2011   CHOL 187 04/07/2011   TRIG 114.0 04/07/2011   HDL 81.80 04/07/2011   LDLCALC 82 04/07/2011   ALT 24 04/07/2011   AST 25 04/07/2011   NA 140 04/07/2011   K 4.1 04/07/2011   CL 104 04/07/2011   CREATININE 0.9 04/07/2011   BUN 10 04/07/2011   CO2 28 04/07/2011   TSH 3.54 04/07/2011

## 2012-03-08 NOTE — Assessment & Plan Note (Signed)
Mild, declines specific tx now,  to f/u any worsening symptoms or concerns

## 2012-03-31 ENCOUNTER — Other Ambulatory Visit (INDEPENDENT_AMBULATORY_CARE_PROVIDER_SITE_OTHER): Payer: Medicare Other

## 2012-03-31 DIAGNOSIS — E785 Hyperlipidemia, unspecified: Secondary | ICD-10-CM

## 2012-03-31 DIAGNOSIS — Z0000 Encounter for general adult medical examination without abnormal findings: Secondary | ICD-10-CM

## 2012-03-31 DIAGNOSIS — Z79899 Other long term (current) drug therapy: Secondary | ICD-10-CM

## 2012-03-31 LAB — HEPATIC FUNCTION PANEL
ALT: 22 U/L (ref 0–35)
AST: 30 U/L (ref 0–37)
Albumin: 3.6 g/dL (ref 3.5–5.2)
Alkaline Phosphatase: 39 U/L (ref 39–117)
Bilirubin, Direct: 0.1 mg/dL (ref 0.0–0.3)
Total Bilirubin: 0.5 mg/dL (ref 0.3–1.2)
Total Protein: 7.4 g/dL (ref 6.0–8.3)

## 2012-03-31 LAB — LIPID PANEL
Cholesterol: 208 mg/dL — ABNORMAL HIGH (ref 0–200)
HDL: 74.6 mg/dL (ref >39.00)
Total CHOL/HDL Ratio: 3
Triglycerides: 150 mg/dL — ABNORMAL HIGH (ref 0.0–149.0)
VLDL: 30 mg/dL (ref 0.0–40.0)

## 2012-03-31 LAB — BASIC METABOLIC PANEL
BUN: 13 mg/dL (ref 6–23)
CO2: 29 mEq/L (ref 19–32)
Calcium: 10 mg/dL (ref 8.4–10.5)
Chloride: 103 mEq/L (ref 96–112)
Creatinine, Ser: 1.1 mg/dL (ref 0.4–1.2)
GFR: 52 mL/min — ABNORMAL LOW (ref >60.00)
Glucose, Bld: 90 mg/dL (ref 70–99)
Potassium: 3.4 mEq/L — ABNORMAL LOW (ref 3.5–5.1)
Sodium: 140 mEq/L (ref 135–145)

## 2012-03-31 LAB — URINALYSIS, ROUTINE W REFLEX MICROSCOPIC
Bilirubin Urine: NEGATIVE
Hgb urine dipstick: NEGATIVE
Ketones, ur: NEGATIVE
Leukocytes, UA: NEGATIVE
Nitrite: NEGATIVE
Specific Gravity, Urine: 1.01 (ref 1.000–1.030)
Total Protein, Urine: NEGATIVE
Urine Glucose: NEGATIVE
Urobilinogen, UA: 0.2 (ref 0.0–1.0)
pH: 7 (ref 5.0–8.0)

## 2012-03-31 LAB — CBC WITH DIFFERENTIAL/PLATELET
Basophils Absolute: 0.1 10*3/uL (ref 0.0–0.1)
Basophils Relative: 0.6 % (ref 0.0–3.0)
Eosinophils Absolute: 0.2 10*3/uL (ref 0.0–0.7)
Eosinophils Relative: 1.9 % (ref 0.0–5.0)
HCT: 42 % (ref 36.0–46.0)
Hemoglobin: 13.9 g/dL (ref 12.0–15.0)
Lymphocytes Relative: 36.7 % (ref 12.0–46.0)
Lymphs Abs: 4 10*3/uL (ref 0.7–4.0)
MCHC: 33.1 g/dL (ref 30.0–36.0)
MCV: 97.9 fl (ref 78.0–100.0)
Monocytes Absolute: 1.1 10*3/uL — ABNORMAL HIGH (ref 0.1–1.0)
Monocytes Relative: 10.5 % (ref 3.0–12.0)
Neutro Abs: 5.4 10*3/uL (ref 1.4–7.7)
Neutrophils Relative %: 50.3 % (ref 43.0–77.0)
Platelets: 231 10*3/uL (ref 150.0–400.0)
RBC: 4.29 Mil/uL (ref 3.87–5.11)
RDW: 13.6 % (ref 11.5–14.6)
WBC: 10.8 10*3/uL — ABNORMAL HIGH (ref 4.5–10.5)

## 2012-03-31 LAB — LDL CHOLESTEROL, DIRECT: Direct LDL: 110.8 mg/dL

## 2012-03-31 LAB — TSH: TSH: 6.89 u[IU]/mL — ABNORMAL HIGH (ref 0.35–5.50)

## 2012-04-07 ENCOUNTER — Encounter: Payer: Self-pay | Admitting: Internal Medicine

## 2012-04-07 ENCOUNTER — Ambulatory Visit (INDEPENDENT_AMBULATORY_CARE_PROVIDER_SITE_OTHER): Payer: Medicare Other | Admitting: Internal Medicine

## 2012-04-07 VITALS — BP 120/80 | HR 75 | Temp 97.5°F | Ht 61.0 in | Wt 146.0 lb

## 2012-04-07 DIAGNOSIS — Z0000 Encounter for general adult medical examination without abnormal findings: Secondary | ICD-10-CM

## 2012-04-07 DIAGNOSIS — E039 Hypothyroidism, unspecified: Secondary | ICD-10-CM

## 2012-04-07 DIAGNOSIS — J209 Acute bronchitis, unspecified: Secondary | ICD-10-CM

## 2012-04-07 DIAGNOSIS — G43909 Migraine, unspecified, not intractable, without status migrainosus: Secondary | ICD-10-CM

## 2012-04-07 MED ORDER — HYDROCODONE-HOMATROPINE 5-1.5 MG/5ML PO SYRP: 5.0000 mL | ORAL_SOLUTION | Freq: Four times a day (QID) | ORAL | Status: DC | PRN

## 2012-04-07 MED ORDER — BUPROPION HCL ER (XL) 300 MG PO TB24: 300.0000 mg | ORAL_TABLET | Freq: Every day | ORAL | Status: DC

## 2012-04-07 MED ORDER — TRIAMTERENE-HCTZ 37.5-25 MG PO TABS: 1.0000 | ORAL_TABLET | Freq: Every day | ORAL | Status: DC

## 2012-04-07 MED ORDER — ESTRADIOL 2 MG PO TABS: 1.0000 mg | ORAL_TABLET | Freq: Every day | ORAL | Status: DC

## 2012-04-07 MED ORDER — LEVOTHYROXINE SODIUM 25 MCG PO TABS: 25.0000 ug | ORAL_TABLET | Freq: Every day | ORAL | Status: DC

## 2012-04-07 MED ORDER — PROMETHAZINE HCL 25 MG PO TABS: 25.0000 mg | ORAL_TABLET | Freq: Four times a day (QID) | ORAL | Status: DC | PRN

## 2012-04-07 MED ORDER — GABAPENTIN 300 MG PO CAPS: 300.0000 mg | ORAL_CAPSULE | Freq: Three times a day (TID) | ORAL | Status: DC

## 2012-04-07 MED ORDER — TRIAMCINOLONE ACETONIDE 0.5 % EX CREA: TOPICAL_CREAM | Freq: Two times a day (BID) | CUTANEOUS | Status: DC

## 2012-04-07 MED ORDER — TOPIRAMATE 50 MG PO TABS: 50.0000 mg | ORAL_TABLET | Freq: Two times a day (BID) | ORAL | Status: DC

## 2012-04-07 MED ORDER — ALPRAZOLAM 0.5 MG PO TABS: 0.5000 mg | ORAL_TABLET | Freq: Two times a day (BID) | ORAL | Status: DC | PRN

## 2012-04-07 MED ORDER — CEPHALEXIN 500 MG PO CAPS: 500.0000 mg | ORAL_CAPSULE | Freq: Four times a day (QID) | ORAL | Status: DC

## 2012-04-07 MED ORDER — FEXOFENADINE HCL 180 MG PO TABS: 180.0000 mg | ORAL_TABLET | Freq: Every day | ORAL | Status: DC

## 2012-04-07 MED ORDER — CITALOPRAM HYDROBROMIDE 10 MG PO TABS: 10.0000 mg | ORAL_TABLET | Freq: Every day | ORAL | Status: DC

## 2012-04-07 MED ORDER — PANTOPRAZOLE SODIUM 40 MG PO TBEC: 40.0000 mg | DELAYED_RELEASE_TABLET | Freq: Every day | ORAL | Status: DC

## 2012-04-07 NOTE — Patient Instructions (Addendum)
Your EKG was OK today You are given the copy of your lab testing Take all new medications as prescribed - the thyroid medication, antibiotic, and cough medicine Continue all other medications as before, except for the fosamax as you have stopped Please return in 4 wks for repeat thyroid blood test on the new medication Please continue your efforts at being more active, low cholesterol diet, and weight control. You are otherwise up to date with prevention measures Your medications were all refilled today Please return in 1 year for your yearly visit, or sooner if needed, with Lab testing done 3-5 days before

## 2012-04-07 NOTE — Assessment & Plan Note (Signed)
New onset, mild, asympt but to start levothyroxin 25 mcg qd with f/u labs at 4 wks

## 2012-04-07 NOTE — Assessment & Plan Note (Signed)

## 2012-04-09 ENCOUNTER — Encounter: Payer: Self-pay | Admitting: Internal Medicine

## 2012-04-09 DIAGNOSIS — J209 Acute bronchitis, unspecified: Secondary | ICD-10-CM | POA: Insufficient documentation

## 2012-04-09 NOTE — Assessment & Plan Note (Signed)
Mild to mod, for antibx course,  to f/u any worsening symptoms or concerns 

## 2012-04-09 NOTE — Progress Notes (Signed)
Subjective:    Patient ID: Laura Barnes, female    DOB: Nov 27, 1940, 71 y.o.   MRN: [id number]  HPI  Here for wellness and f/u;  Overall doing ok;  Pt denies CP, worsening SOB, DOE, wheezing, orthopnea, PND, worsening LE edema, palpitations, dizziness or syncope.  Pt denies neurological change such as new Headache, facial or extremity weakness.  Pt denies polydipsia, polyuria, or low sugar symptoms. Pt states overall good compliance with treatment and medications, good tolerability, and trying to follow lower cholesterol diet.  Pt denies worsening depressive symptoms, suicidal ideation or panic. No fever, wt loss, night sweats, loss of appetite, or other constitutional symptoms.  Pt states good ability with ADL's, low fall risk, home safety reviewed and adequate, no significant changes in hearing or vision, and occasionally active with exercise.  Has ongoing chronic migraine, stable overall by hx and exam, and pt to continue medical treatment as before.  No acute complaints except Here with acute onset mild to mod 2-3 days ST, HA, general weakness and malaise, with prod cough greenish sputum. Did stop her fosamax some time ago, though it made her feel funny, worried about dental problem Past Medical History  Diagnosis Date  . HYPERLIPIDEMIA   . ANXIETY   . DEPRESSION   . MIGRAINE, COMMON   . MIGRAINE HEADACHE   . VENOUS INSUFFICIENCY, CHRONIC   . SINUSITIS- ACUTE-NOS   . ALLERGIC RHINITIS   . GERD   . IBS   . VAGINITIS   . SKIN LESION   . Enthesopathy of hip region   . FOOT PAIN, BILATERAL   . PLANTAR FASCIITIS   . OSTEOPOROSIS   . OSTEOPENIA   . RASH-NONVESICULAR   . NECK MASS   . Hiatal hernia    Past Surgical History  Procedure Date  . Appendectomy   . Cholecystectomy   . Abdominal hysterectomy   . 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 Oophorectomy   . Shoulder surgery   . S/p neck lump removal   . Breast enhancement surgery     reports  that she has never smoked. She has never used smokeless tobacco. She reports that she does not drink alcohol or use illicit drugs. family history includes Breast cancer in her maternal grandmother; Diabetes in her mother, other, and sister; Heart disease in her maternal aunt and maternal uncle; and Kidney disease in her paternal uncle. Allergies  Allergen Reactions  . Amoxicillin-Pot Clavulanate   . Aspirin   . Doxycycline   . Erythromycin   . Hydrocodone     REACTION: itch  . Hydrocodone-Acetaminophen   . Levofloxacin     Headaches, GI upset  . Methylphenidate Hcl   . Mirtazapine   . Nsaids     REACTION: stomach bleeding per pt  . Prednisone   . Rofecoxib   . Sertraline Hcl   . Sulfonamide Derivatives   . Sumatriptan    Current Outpatient Prescriptions on File Prior to Visit  Medication Sig Dispense Refill  . AMBULATORY NON FORMULARY MEDICATION Domperidone 10mg  take one tablet by mouth three times a day with meals  240 tablet  3  . buPROPion (WELLBUTRIN XL) 300 MG 24 hr tablet Take 1 tablet (300 mg total) by mouth daily.  90 tablet  3  . citalopram (CELEXA) 10 MG tablet Take 1 tablet (10 mg total) by mouth daily.  90 tablet  3  . estradiol (ESTRACE) 2 MG tablet Take 0.5 tablets (1  mg total) by mouth daily.  90 tablet  3  . fexofenadine (ALLEGRA) 180 MG tablet Take 1 tablet (180 mg total) by mouth daily.  90 tablet  3  . gabapentin (NEURONTIN) 300 MG capsule Take 1 capsule (300 mg total) by mouth 3 (three) times daily.  270 capsule  1  . pantoprazole (PROTONIX) 40 MG tablet Take 1 tablet (40 mg total) by mouth daily.  90 tablet  3  . topiramate (TOPAMAX) 50 MG tablet Take 1 tablet (50 mg total) by mouth 2 (two) times daily.  180 tablet  3  . triamterene-hydrochlorothiazide (MAXZIDE-25) 37.5-25 MG per tablet Take 1 each (1 tablet total) by mouth daily. 1/2 - 1 by mouth once daily as needed for swelling  90 tablet  3  . levothyroxine (LEVOTHROID) 25 MCG tablet Take 1 tablet (25 mcg  total) by mouth daily.  90 tablet  3  . promethazine (PHENERGAN) 25 MG tablet Take 1 tablet (25 mg total) by mouth every 6 (six) hours as needed for nausea.  40 tablet  1   Review of Systems Review of Systems  Constitutional: Negative for diaphoresis, activity change, appetite change and unexpected weight change.  HENT: Negative for hearing loss, ear pain, facial swelling, mouth sores and neck stiffness.   Eyes: Negative for pain, redness and visual disturbance.  Respiratory: Negative for shortness of breath and wheezing.   Cardiovascular: Negative for chest pain and palpitations.  Gastrointestinal: Negative for diarrhea, blood in stool, abdominal distention and rectal pain.  Genitourinary: Negative for hematuria, flank pain and decreased urine volume.  Musculoskeletal: Negative for myalgias and joint swelling.  Skin: Negative for color change and wound.  Neurological: Negative for syncope and numbness.  Hematological: Negative for adenopathy.  Psychiatric/Behavioral: Negative for hallucinations, self-injury, decreased concentration and agitation.      Objective:   Physical Exam BP 120/80  Pulse 75  Temp 97.5 F (36.4 C) (Oral)  Ht 5\' 1"  (1.549 m)  Wt 146 lb (66.225 kg)  BMI 27.59 kg/m2  SpO2 98% Physical Exam  VS noted Constitutional: Pt is oriented to person, place, and time. Appears well-developed and well-nourished.  HENT:  Head: Normocephalic and atraumatic.  Right Ear: External ear normal.  Left Ear: External ear normal.  Nose: Nose normal.  Mouth/Throat: Oropharynx is clear and moist.  Bilat tm's mild erythema.  Sinus nontender.  Pharynx mild erythema Eyes: Conjunctivae and EOM are normal. Pupils are equal, round, and reactive to light.  Neck: Normal range of motion. Neck supple. No JVD present. No tracheal deviation present.  Cardiovascular: Normal rate, regular rhythm, normal heart sounds and intact distal pulses.   Pulmonary/Chest: Effort normal and breath sounds  normal.  Abdominal: Soft. Bowel sounds are normal. There is no tenderness.  Musculoskeletal: Normal range of motion. Exhibits no edema.  Lymphadenopathy:  Has no cervical adenopathy.  Neurological: Pt is alert and oriented to person, place, and time. Pt has normal reflexes. No cranial nerve deficit.  Skin: Skin is warm and dry. No rash noted.  Psychiatric:  Has  normal mood and affect. Behavior is normal.     Assessment & Plan:

## 2012-07-01 ENCOUNTER — Other Ambulatory Visit: Payer: Self-pay | Admitting: Internal Medicine

## 2012-07-01 NOTE — Telephone Encounter (Signed)
Should still have refills. 

## 2012-07-12 ENCOUNTER — Ambulatory Visit (INDEPENDENT_AMBULATORY_CARE_PROVIDER_SITE_OTHER): Payer: Medicare Other | Admitting: Gastroenterology

## 2012-07-12 ENCOUNTER — Other Ambulatory Visit (INDEPENDENT_AMBULATORY_CARE_PROVIDER_SITE_OTHER): Payer: Medicare Other

## 2012-07-12 ENCOUNTER — Encounter: Payer: Self-pay | Admitting: Gastroenterology

## 2012-07-12 VITALS — BP 112/80 | HR 89 | Ht 61.0 in | Wt 141.0 lb

## 2012-07-12 DIAGNOSIS — R109 Unspecified abdominal pain: Secondary | ICD-10-CM

## 2012-07-12 DIAGNOSIS — K3184 Gastroparesis: Secondary | ICD-10-CM

## 2012-07-12 DIAGNOSIS — K219 Gastro-esophageal reflux disease without esophagitis: Secondary | ICD-10-CM

## 2012-07-12 LAB — AMYLASE: Amylase: 130 U/L (ref 27–131)

## 2012-07-12 LAB — COMPREHENSIVE METABOLIC PANEL
ALT: 24 U/L (ref 0–35)
AST: 30 U/L (ref 0–37)
Albumin: 4 g/dL (ref 3.5–5.2)
Alkaline Phosphatase: 51 U/L (ref 39–117)
BUN: 13 mg/dL (ref 6–23)
CO2: 27 mEq/L (ref 19–32)
Calcium: 9.7 mg/dL (ref 8.4–10.5)
Chloride: 100 mEq/L (ref 96–112)
Creatinine, Ser: 0.9 mg/dL (ref 0.4–1.2)
GFR: 62.3 mL/min (ref >60.00)
Glucose, Bld: 92 mg/dL (ref 70–99)
Potassium: 3.2 mEq/L — ABNORMAL LOW (ref 3.5–5.1)
Sodium: 137 mEq/L (ref 135–145)
Total Bilirubin: 0.4 mg/dL (ref 0.3–1.2)
Total Protein: 8.1 g/dL (ref 6.0–8.3)

## 2012-07-12 LAB — HEPATIC FUNCTION PANEL
ALT: 24 U/L (ref 0–35)
AST: 30 U/L (ref 0–37)
Albumin: 4 g/dL (ref 3.5–5.2)
Alkaline Phosphatase: 51 U/L (ref 39–117)
Bilirubin, Direct: 0.1 mg/dL (ref 0.0–0.3)
Total Bilirubin: 0.4 mg/dL (ref 0.3–1.2)
Total Protein: 8.1 g/dL (ref 6.0–8.3)

## 2012-07-12 LAB — BASIC METABOLIC PANEL
BUN: 13 mg/dL (ref 6–23)
CO2: 27 mEq/L (ref 19–32)
Calcium: 9.7 mg/dL (ref 8.4–10.5)
Chloride: 100 mEq/L (ref 96–112)
Creatinine, Ser: 0.9 mg/dL (ref 0.4–1.2)
GFR: 62.3 mL/min (ref >60.00)
Glucose, Bld: 92 mg/dL (ref 70–99)
Potassium: 3.2 mEq/L — ABNORMAL LOW (ref 3.5–5.1)
Sodium: 137 mEq/L (ref 135–145)

## 2012-07-12 LAB — IBC PANEL
Iron: 70 ug/dL (ref 42–145)
Saturation Ratios: 22 % (ref 20.0–50.0)
Transferrin: 227.6 mg/dL (ref 212.0–360.0)

## 2012-07-12 LAB — CBC WITH DIFFERENTIAL/PLATELET
Basophils Absolute: 0 10*3/uL (ref 0.0–0.1)
Basophils Relative: 0.2 % (ref 0.0–3.0)
Eosinophils Absolute: 0.1 10*3/uL (ref 0.0–0.7)
Eosinophils Relative: 1.1 % (ref 0.0–5.0)
HCT: 42.2 % (ref 36.0–46.0)
Hemoglobin: 14.3 g/dL (ref 12.0–15.0)
Lymphocytes Relative: 25.3 % (ref 12.0–46.0)
Lymphs Abs: 3.2 10*3/uL (ref 0.7–4.0)
MCHC: 33.9 g/dL (ref 30.0–36.0)
MCV: 95.6 fl (ref 78.0–100.0)
Monocytes Absolute: 1.3 10*3/uL — ABNORMAL HIGH (ref 0.1–1.0)
Monocytes Relative: 10.4 % (ref 3.0–12.0)
Neutro Abs: 7.9 10*3/uL — ABNORMAL HIGH (ref 1.4–7.7)
Neutrophils Relative %: 63 % (ref 43.0–77.0)
Platelets: 273 10*3/uL (ref 150.0–400.0)
RBC: 4.41 Mil/uL (ref 3.87–5.11)
RDW: 12.8 % (ref 11.5–14.6)
WBC: 12.6 10*3/uL — ABNORMAL HIGH (ref 4.5–10.5)

## 2012-07-12 LAB — FOLATE: Folate: >24.8 ng/mL (ref >5.9)

## 2012-07-12 LAB — TSH: TSH: 2.01 u[IU]/mL (ref 0.35–5.50)

## 2012-07-12 LAB — VITAMIN B12: Vitamin B-12: 1292 pg/mL — ABNORMAL HIGH (ref 211–911)

## 2012-07-12 LAB — FERRITIN: Ferritin: 110.2 ng/mL (ref 10.0–291.0)

## 2012-07-12 LAB — LIPASE: Lipase: 28 U/L (ref 11.0–59.0)

## 2012-07-12 MED ORDER — SUCRALFATE 1 GM/10ML PO SUSP: 1.0000 g | Freq: Four times a day (QID) | ORAL | Status: DC

## 2012-07-12 MED ORDER — PANTOPRAZOLE SODIUM 40 MG PO TBEC: 40.0000 mg | DELAYED_RELEASE_TABLET | Freq: Two times a day (BID) | ORAL | Status: DC

## 2012-07-12 NOTE — Progress Notes (Signed)
This is a complicated 72 year old Caucasian female with chronic gastroparesis on domperidone 10 mg 3 times a day.  She has documented delayed gastric emptying, but continues to complain of epigastric abdominal pain of rather severe nature without nausea and vomiting.  Her pain has no relationship to meals, and she denies abuse of alcohol, cigarettes, or NSAIDs.  She is on Protonix 40 mg a day in addition to her prokinetic.  She is status post cholecystectomy.  Review of previous CT scan shows no specific abnormalities.  There is no definite history of pancreatitis or hepatitis.  She is up-to-date on her endoscopy and colonoscopy.  Review of her record shows this pain goes back at least for 10 years.  Current Medications, Allergies, Past Medical History, Past Surgical History, Family History and Social History were reviewed in [employer] record.  ROS: All systems were reviewed and are negative unless otherwise stated in the HPI.          Physical Exam: Healthy patient in no distress.  Blood pressure 112/80, pulse 89 and regular, and weight 141 with a BMI of 26.68.  I cannot appreciate stigmata of chronic liver disease.  Her chest is clear and she is in a regular rhythm without murmurs gallops or rubs.  Her abdomen shows no organomegaly, masses, significant tenderness, and there is no succussion splash noted.  Mental status is normal.    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  We will check labs including amylase and lipase, and I've gone ahead and scheduled CT scan of the abdomen and pelvis which has not been done in several years to be complete.  I've increased her Protonix to 40 mg twice a day and stopped her domperidone.  Also prescribe liquid Carafate for when necessary usage.  She is status post cholecystectomy.  She is on multiple medications  including Xanax, Wellbutrin, Celexa, Levothyroid, Topamax, and Maxzide.  She denies abuse of alcohol, cigarettes, or NSAIDs. Encounter Diagnoses  Name Primary?  . GERD (gastroesophageal reflux disease) Yes  . Abdominal pain, unspecified site

## 2012-07-12 NOTE — Patient Instructions (Addendum)
You have been scheduled for a CT scan of the abdomen and pelvis at [facility] ([address]---this is in the same building as [facility]).   You are scheduled on 07-18-2012 at 1 PM. You should arrive 15 minutes prior to your appointment time for registration. Please follow the written instructions below on the day of your exam:  WARNING: IF YOU ARE ALLERGIC TO IODINE/X-RAY DYE, PLEASE NOTIFY RADIOLOGY IMMEDIATELY AT [phone]! YOU WILL BE GIVEN A 13 HOUR PREMEDICATION PREP.  1) Do not eat or drink anything after 9 AM (4 hours prior to your test) 2) You have been given 2 bottles of oral contrast to drink. The solution may taste better if refrigerated, but do NOT add ice or any other liquid to this solution. Shake well before drinking.    Drink 1 bottle of contrast @ 11 AM (2 hours prior to your exam)  Drink 1 bottle of contrast @ 12 PM (1 hour prior to your exam)  You may take any medications as prescribed with a small amount of water except for the following: Metformin, Glucophage, Glucovance, Avandamet, Riomet, Fortamet, Actoplus Met, Janumet, Glumetza or Metaglip. The above medications must be held the day of the exam AND 48 hours after the exam.  The purpose of you drinking the oral contrast is to aid in the visualization of your intestinal tract. The contrast solution may cause some diarrhea. Before your exam is started, you will be given a small amount of fluid to drink. Depending on your individual set of symptoms, you may also receive an intravenous injection of x-ray contrast/dye. Plan on being at [facility] for 30 minutes or long, depending on the type of exam you are having performed.  This test typically takes 30-45 minutes to complete.  If you have any questions regarding your exam or if you need to reschedule, you may call the CT department at [phone] between the hours of 8:00 am and 5:00 pm,  Monday-Friday.  _____________________________________________________________________________________________________________________  We have sent the following medications to your pharmacy for you to pick up at your convenience: Carafate, please take as directed.   Please increase Protonix to one capsule  twice daily. New prescription sent to your pharmacy.  Please stop your Domperidone.   Your physician has requested that you go to the basement for lab work before leaving today.

## 2012-07-18 ENCOUNTER — Telehealth: Payer: Self-pay | Admitting: Internal Medicine

## 2012-07-18 ENCOUNTER — Ambulatory Visit (INDEPENDENT_AMBULATORY_CARE_PROVIDER_SITE_OTHER)
Admission: RE | Admit: 2012-07-18 | Discharge: 2012-07-18 | Disposition: A | Discharge status: Home / Self Care | Payer: Medicare Other | Source: Ambulatory Visit | Attending: Gastroenterology | Admitting: Gastroenterology

## 2012-07-18 DIAGNOSIS — R109 Unspecified abdominal pain: Secondary | ICD-10-CM

## 2012-07-18 DIAGNOSIS — K219 Gastro-esophageal reflux disease without esophagitis: Secondary | ICD-10-CM

## 2012-07-18 MED ORDER — POTASSIUM CHLORIDE ER 10 MEQ PO TBCR: 10.0000 meq | EXTENDED_RELEASE_TABLET | Freq: Every day | ORAL | Status: DC

## 2012-07-18 MED ORDER — IOHEXOL 300 MG/ML  SOLN
100.0000 mL | Freq: Once | INTRAMUSCULAR | Status: AC | PRN
  Administered 2012-07-18: 80 mL via INTRAVENOUS

## 2012-07-18 NOTE — Telephone Encounter (Signed)
Ok for [employer] con 10 qd  Zella Ball to let pt know - needs K due to taking the fluid pill

## 2012-07-18 NOTE — Telephone Encounter (Signed)
Message copied by Corwin Levins on Mon Jul 18, 2012  5:39 PM ------      Message from: Florene Glen      Created: Mon Jul 18, 2012  4:25 PM       Pt informed of labs; I only received the results today. Will forward this to Dr Jonny Ruiz. ------

## 2012-07-19 ENCOUNTER — Other Ambulatory Visit: Payer: Self-pay | Admitting: *Deleted

## 2012-07-19 MED ORDER — SUCRALFATE 1 G PO TABS: ORAL_TABLET | ORAL | Status: DC

## 2012-07-19 MED ORDER — TRAMADOL HCL 50 MG PO TABS: 50.0000 mg | ORAL_TABLET | Freq: Four times a day (QID) | ORAL | Status: DC | PRN

## 2012-07-19 NOTE — Telephone Encounter (Signed)
Patient informed. 

## 2012-08-31 ENCOUNTER — Telehealth: Payer: Self-pay | Admitting: Gastroenterology

## 2012-08-31 NOTE — Telephone Encounter (Signed)
Pt was seen on 07/12/12 followed by a normal CT scan for abdominal pain and nausea. Hx of GERD, Abdominal pain, Gastroparesis, Post Cholecystectomy. On this visit, Dr Jarold Motto stopped the Domperidone and added Carafate and increased the Protonix to BID. Pt reports she is no better; her stomach continues to hurt across her abdomen at the level of her umbilicus and she has nausea that phenergan that does not help. She states her abdomen feels like it does after a bad case of diarrhea, but her bowels are regular. Please advise. Thanks.

## 2012-09-01 NOTE — Telephone Encounter (Signed)
Ask radiology to review her recent CT scan and to give [place] a meeting as to her mesenteric arteries size and opening to exclude ischemic bowel problems.  Also needs results of her stress cardiac exam.  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  Try Librax twice a day.

## 2012-09-01 NOTE — Telephone Encounter (Signed)
Informed pt we will have her CT scan looked at again for vascular review and that Dr Jarold Motto thinks she may have adhesions. He wants to order Librax to help with her cramping; pt stated understanding.

## 2012-09-02 ENCOUNTER — Telehealth: Payer: Self-pay | Admitting: Gastroenterology

## 2012-09-02 MED ORDER — CILIDINIUM-CHLORDIAZEPOXIDE 2.5-5 MG PO CAPS: 1.0000 | ORAL_CAPSULE | Freq: Three times a day (TID) | ORAL | Status: DC | PRN

## 2012-09-02 MED ORDER — CILIDINIUM-CHLORDIAZEPOXIDE 2.5-5 MG PO CAPS: 1.0000 | ORAL_CAPSULE | Freq: Two times a day (BID) | ORAL | Status: DC | PRN

## 2012-09-02 NOTE — Telephone Encounter (Signed)
both

## 2012-09-02 NOTE — Telephone Encounter (Signed)
lmom for pt to call back to schedule ECL.

## 2012-09-02 NOTE — Telephone Encounter (Addendum)
Pt reports the librax was never ordered; done. Dr Jarold Motto would like to do another COLON and she states she has reflux also, so should the EGD be repeated as well? Her protonix was increased to Bid with no help in reflux symptoms. She reports an increase in the abdominal pain that radiates to her back, when she's cold, stating everything tightens up. I asked if she would be open to seeing a pain clinic and she was agreeable, but thinks she needs the [facility] if it is pain for. Please advise; ECL? Thanks

## 2012-09-02 NOTE — Telephone Encounter (Signed)
Need referral to a pain clinic per her chronic abdominal pain management. ----- Message ----- From: Linna Hoff, RN Sent: 09/02/2012 9:13 AM To: Mardella Layman, MD

## 2012-09-19 ENCOUNTER — Encounter: Payer: Self-pay | Admitting: *Deleted

## 2012-09-19 ENCOUNTER — Telehealth: Payer: Self-pay | Admitting: Gastroenterology

## 2012-09-19 NOTE — Telephone Encounter (Signed)
Pt c/o same old stomach and abdominal pain, nausea, and bloating. The bloating/gas is in the upper abdomen; she is on Protonix, but is not taking the Carafate and was taken off Domperidone on last visit. There have been many phones calls back and forth and an ECL was to be ordered, but pt wanted the make sure it would be paid for. Informed pt we do not have to do prior auth for Medicare, so I can't tell her anything. Suggested she call Medicare herself, but also informed her their approval is not always a guarantee. She does not think her pain is something a Pain Clinic can help with. She was given Tramadol in the past and it didn't help; but Librax helped with the pain, but not the bloating. Pt given an appt to see Dr Jarold Motto tomorrow.

## 2012-09-20 ENCOUNTER — Other Ambulatory Visit (INDEPENDENT_AMBULATORY_CARE_PROVIDER_SITE_OTHER): Payer: Medicare Other

## 2012-09-20 ENCOUNTER — Ambulatory Visit (INDEPENDENT_AMBULATORY_CARE_PROVIDER_SITE_OTHER): Payer: Medicare Other | Admitting: Gastroenterology

## 2012-09-20 ENCOUNTER — Encounter: Payer: Self-pay | Admitting: Gastroenterology

## 2012-09-20 VITALS — BP 124/86 | HR 95 | Ht 61.0 in | Wt 142.4 lb

## 2012-09-20 DIAGNOSIS — R109 Unspecified abdominal pain: Secondary | ICD-10-CM

## 2012-09-20 DIAGNOSIS — K219 Gastro-esophageal reflux disease without esophagitis: Secondary | ICD-10-CM

## 2012-09-20 DIAGNOSIS — R110 Nausea: Secondary | ICD-10-CM

## 2012-09-20 LAB — ELECTROLYTE PANEL
CO2: 27 mEq/L (ref 19–32)
Chloride: 104 mEq/L (ref 96–112)
Potassium: 4.5 mEq/L (ref 3.5–5.1)
Sodium: 137 mEq/L (ref 135–145)

## 2012-09-20 NOTE — Patient Instructions (Signed)
You have been scheduled for an endoscopy with propofol. Please follow written instructions given to you at your visit today. If you use inhalers (even only as needed), please bring them with you on the day of your procedure. Your physician has requested that you go to www.startemmi.com and enter the access code given to you at your visit today. This web site gives a general overview about your procedure. However, you should still follow specific instructions given to you by our office regarding your preparation for the procedure.  Your physician has requested that you go to the basement for the following lab work before leaving today: Electrolyte Panel

## 2012-09-20 NOTE — Progress Notes (Signed)
This is a 72 year old Caucasian female with chronic abdominal pain over the last year of unexplained etiology.  She had endoscopy and colonoscopy in June which were unremarkable except for mild pyloric stenosis.  Attempts to treat her with PPI medications and prokinetics have not helped any of her symptoms.  Because of persistence of her pain which he describes as a constant steady pain across her entire upper abdomen, CT scan was obtained which is entirely normal.  All of her labs are been normal except for unexplained hypokalemia probably related to Maxzide use.  She is on a variety of psychotropic medication including Xanax, Wellbutrin XL, Celexa, Librax, and Topamax.  She has a history of at least 10 drug allergies.  Despite all these complaints she's had no anorexia, weight loss, melena or hematochezia.  Also she denies a specific food intolerances.  Current Medications, Allergies, Past Medical History, Past Surgical History, Family History and Social History were reviewed in [employer] record.  ROS: All systems were reviewed and are negative unless otherwise stated in the HPI.          Physical Exam: Healthy-appearing patient in no distress.  Blood pressure 124/86, pulse 95 and regular and weight 142 with a BMI of 26.92.  Cannot appreciate stigmata of chronic liver disease.  Chest is clear and she is in a regular rhythm without murmurs gallops or rubs.  There is no hepatosplenomegaly, abdominal masses or significant tenderness.  Bowel sounds are normal.  Specks of rectum is unremarkable as is rectal exam.  There is formed stool in the rectal vault which is guaiac negative.  Peripheral extremities are unremarkable, and mental status is normal.    Assessment and Plan: I think this patient has a chronic pain syndrome and will need referral to a pain clinic for management.  I doubt we will turn up any other pathologic conditions, but I will repeat her endoscopy because of her  previous pyloric stenosis, also check her serum potassium level.  I do not think she needs followup colonoscopy at this time.  She seems very unhappy about her medical condition, and I have offered referral to [facility] for second opinion. Encounter Diagnoses  Name Primary?  . Nausea alone Yes  . GERD (gastroesophageal reflux disease)

## 2012-09-21 ENCOUNTER — Ambulatory Visit (AMBULATORY_SURGERY_CENTER): Payer: Medicare Other | Admitting: Gastroenterology

## 2012-09-21 ENCOUNTER — Telehealth: Payer: Self-pay | Admitting: *Deleted

## 2012-09-21 ENCOUNTER — Encounter: Payer: Self-pay | Admitting: Gastroenterology

## 2012-09-21 VITALS — BP 125/76 | HR 75 | Temp 98.3°F | Resp 16 | Ht 61.0 in | Wt 142.0 lb

## 2012-09-21 DIAGNOSIS — K2970 Gastritis, unspecified, without bleeding: Secondary | ICD-10-CM

## 2012-09-21 DIAGNOSIS — K2990 Gastroduodenitis, unspecified, without bleeding: Secondary | ICD-10-CM

## 2012-09-21 DIAGNOSIS — R110 Nausea: Secondary | ICD-10-CM

## 2012-09-21 DIAGNOSIS — K219 Gastro-esophageal reflux disease without esophagitis: Secondary | ICD-10-CM

## 2012-09-21 MED ORDER — SODIUM CHLORIDE 0.9 % IV SOLN: 500.0000 mL | INTRAVENOUS | Status: DC

## 2012-09-21 NOTE — Telephone Encounter (Signed)
Patient called back and I gave her the instructions for her gastric emptying scan.  Advised patient that I will mail her instructions to her and verified home address.  Patient verbalized understanding of gastric emptying scan instructions.

## 2012-09-21 NOTE — Op Note (Signed)
[facility] [address] [place], [address]   ENDOSCOPY PROCEDURE REPORT  PATIENT: Laura Barnes, Laura Barnes  MR#: [id number] BIRTHDATE: [birth year] , 71  yrs. old GENDER: Female ENDOSCOPIST:David Hale Bogus, MD, [facility] REFERRED BY: PROCEDURE DATE:  09/21/2012 PROCEDURE:   EGD w/ biopsy and EGD w/ biopsy for H.pylori ASA CLASS:    Class II INDICATIONS: abdominal pain in upper left quadrant. MEDICATION: Propofol (Diprivan) 170 mg IV TOPICAL ANESTHETIC:  DESCRIPTION OF PROCEDURE:   After the risks and benefits of the procedure were explained, informed consent was obtained.  The LB GIF-H180 [id number]  endoscope was introduced through the mouth  and advanced to the second portion of the duodenum .  The instrument was slowly withdrawn as the mucosa was fully examined.      ESOPHAGUS: The mucosa of the esophagus appeared normal.  DUODENUM: The duodenal mucosa showed no abnormalities in the bulb and second portion of the duodenum.  STOMACH: Mild chronic gastritis (inflammation) was found in the gastric fundus.  Multiple biopsies were performed using hot forceps.  Sample obtained for evaluation of chronic inflammation. Sample obtained for helicobacter pylori testing using rapid urease test.  Sample obtained for helicobacter pylori testing. Retained pills and some food.   Retroflexed views revealed no abnormalities. The scope was then withdrawn from the patient and the procedure completed.  COMPLICATIONS: There were no complications.   ENDOSCOPIC IMPRESSION: 1.   The mucosa of the esophagus appeared normal 2.   The duodenal mucosa showed no abnormalities in the bulb and second portion of the duodenum 3.   Chronic gastritis (inflammation) was found in the gastric fundus; multiple biopsies ...r/o H.pylori.Probable gastroparesis....???.  RECOMMENDATIONS: 1.  Continue current medications 2.  Await pathology results.....check Tc  gastric emptying scan.May need  prokinetic mediacation.       _______________________________ eSignedMardella Layman, MD, [facility] 09/21/2012 3:51 PM      PATIENT NAME:  Laura Barnes MR#: [id number]

## 2012-09-21 NOTE — Progress Notes (Signed)
A/ox3 pleased with MAC report to Annette RN 

## 2012-09-21 NOTE — Progress Notes (Signed)
Pt c/o stomach pain while in the recovery room.  This is the reason we did the egd.  Bx were taken and I explained we will have to wait and see what the results are.  Maw  Patient did not experience any of the following events: a burn prior to discharge; a fall within the facility; wrong site/side/patient/procedure/implant event; or a hospital transfer or hospital admission upon discharge from the facility. (G8907)Patient did not have preoperative order for IV antibiotic SSI prophylaxis. [phone])

## 2012-09-21 NOTE — Telephone Encounter (Signed)
You have been scheduled for a gastric emptying scan at [facility] on 10-10-2012 at 9 am. Please arrive at least 15 minutes prior to your appointment for registration. Please make certain not to have anything to eat or drink after midnight the night before your test. Hold all stomach medications (ex: Zofran, phenergan, Reglan) 48 hours prior to your test. If you need to reschedule your appointment, please contact radiology scheduling at [phone]. _____________________________________________________________________ A gastric-emptying study measures how long it takes for food to move through your stomach. There are several ways to measure stomach emptying. In the most common test, you eat food that contains a small amount of radioactive material. A scanner that detects the movement of the radioactive material is placed over your abdomen to monitor the rate at which food leaves your stomach. This test normally takes about 2 hours to complete. _____________________________________________________________________

## 2012-09-21 NOTE — Patient Instructions (Addendum)
Handout was given to your care partner for gastritis.  The 3rd floor nurse will call you to set up a gastric emptying scan.  Continue your current medication.  Await biopsies.  Please call if any questions or concerns.   YOU HAD AN ENDOSCOPIC PROCEDURE TODAY AT THE [facility]: Refer to the procedure report that was given to you for any specific questions about what was found during the examination.  If the procedure report does not answer your questions, please call your gastroenterologist to clarify.  If you requested that your care partner not be given the details of your procedure findings, then the procedure report has been included in a sealed envelope for you to review at your convenience later.  YOU SHOULD EXPECT: Some feelings of bloating in the abdomen. Passage of more gas than usual.  Walking can help get rid of the air that was put into your GI tract during the procedure and reduce the bloating. If you had a lower endoscopy (such as a colonoscopy or flexible sigmoidoscopy) you may notice spotting of blood in your stool or on the toilet paper. If you underwent a bowel prep for your procedure, then you may not have a normal bowel movement for a few days.  DIET: Your first meal following the procedure should be a light meal and then it is ok to progress to your normal diet.  A half-sandwich or bowl of soup is an example of a good first meal.  Heavy or fried foods are harder to digest and may make you feel nauseous or bloated.  Likewise meals heavy in dairy and vegetables can cause extra gas to form and this can also increase the bloating.  Drink plenty of fluids but you should avoid alcoholic beverages for 24 hours.  ACTIVITY: Your care partner should take you home directly after the procedure.  You should plan to take it easy, moving slowly for the rest of the day.  You can resume normal activity the day after the procedure however you should NOT DRIVE or use heavy machinery for 24  hours (because of the sedation medicines used during the test).    SYMPTOMS TO REPORT IMMEDIATELY: A gastroenterologist can be reached at any hour.  During normal business hours, 8:30 AM to 5:00 PM Monday through Friday, call [phone].  After hours and on weekends, please call the GI answering service at [phone] who will take a message and have the physician on call contact you.     Following upper endoscopy (EGD)  Vomiting of blood or coffee ground material  New chest pain or pain under the shoulder blades  Painful or persistently difficult swallowing  New shortness of breath  Fever of 100F or higher  Black, tarry-looking stools  FOLLOW UP: If any biopsies were taken you will be contacted by phone or by letter within the next 1-3 weeks.  Call your gastroenterologist if you have not heard about the biopsies in 3 weeks.  Our staff will call the home number listed on your records the next business day following your procedure to check on you and address any questions or concerns that you may have at that time regarding the information given to you following your procedure. This is a courtesy call and so if there is no answer at the home number and we have not heard from you through the emergency physician on call, we will assume that you have returned to your regular daily activities without incident.  SIGNATURES/CONFIDENTIALITY: You and/or your care partner have signed paperwork which will be entered into your electronic medical record.  These signatures attest to the fact that that the information above on your After Visit Summary has been reviewed and is understood.  Full responsibility of the confidentiality of this discharge information lies with you and/or your care-partner.

## 2012-09-21 NOTE — Telephone Encounter (Signed)
LEFT MESSAGE ON PATIENTS VOICEMAIL AT HOME AND ON CELL NUMBER FOR RETURN CALL BACK.  PATIENT IS SCHEDULED FOR A GASTRIC EMPTYING SCAN AND I NEED TO GIVE PATIENT HER APPOINTMENT INFORMATION AND PREP INFORMATION.  I MAILED INFORMATION TO PATIENT AS WELL.

## 2012-09-22 ENCOUNTER — Telehealth: Payer: Self-pay | Admitting: *Deleted

## 2012-09-22 LAB — HELICOBACTER PYLORI SCREEN-BIOPSY: UREASE: NEGATIVE

## 2012-09-22 NOTE — Telephone Encounter (Signed)
  Follow up Call-  Call back number 09/21/2012 11/16/2011  Post procedure Call Back phone  # [phone]  Permission to leave phone message Yes Yes     No answer and answering machine did not pick up to leave a message

## 2012-09-23 ENCOUNTER — Telehealth: Payer: Self-pay | Admitting: Gastroenterology

## 2012-09-23 NOTE — Telephone Encounter (Signed)
Patient said after taking Librax the pain is coming back. Needs to know what else to do.

## 2012-09-23 NOTE — Telephone Encounter (Signed)
Gastric scan..prn Tramadol is ok

## 2012-09-23 NOTE — Telephone Encounter (Signed)
Pt's GES is not until 10/10/12. Offered her Tramadol and she stated it doesn't work, the Librax does, she just has to take it more often. Informed her I assumed this drug may become addictive . Pt would like something stronger than Tramadol or increase the Librax to TID. Pt is on Celexa, Wellbutrin and Xanax. Please advise. Thanks.

## 2012-09-23 NOTE — Telephone Encounter (Signed)
Needs pain clinic referral..Marland KitchenTId librax ok... this is a chronic problem and not something new.  He has not need any stronger medications or other treatment most of her problems are functional.

## 2012-09-23 NOTE — Telephone Encounter (Signed)
Pt reports she went to have the Librax refilled and she was denied. She reports she has been taking it TID instead of BID and the pharmacist would not fill it. Pt states the Librax does not remove the pain, just eases it and when she moves around the pain gets worse. OK to change Librax to TID? Thanks.

## 2012-09-27 ENCOUNTER — Encounter: Payer: Self-pay | Admitting: Gastroenterology

## 2012-09-27 ENCOUNTER — Telehealth: Payer: Self-pay | Admitting: Gastroenterology

## 2012-09-27 MED ORDER — CILIDINIUM-CHLORDIAZEPOXIDE 2.5-5 MG PO CAPS: 1.0000 | ORAL_CAPSULE | Freq: Three times a day (TID) | ORAL | Status: DC | PRN

## 2012-09-27 MED ORDER — ESOMEPRAZOLE MAGNESIUM 40 MG PO CPDR: 40.0000 mg | DELAYED_RELEASE_CAPSULE | Freq: Two times a day (BID) | ORAL | Status: DC

## 2012-09-27 NOTE — Telephone Encounter (Signed)
Patient would like to know if the biopsy results are back from her upper endoscopy done on 09/21/12.

## 2012-09-27 NOTE — Telephone Encounter (Signed)
Informed pt we ordered her nexium; it did not require a Prior Auth, but it is a Tier 3 dug. Pt stated understanding.

## 2012-10-10 ENCOUNTER — Ambulatory Visit (HOSPITAL_COMMUNITY)
Admission: RE | Admit: 2012-10-10 | Discharge: 2012-10-10 | Disposition: A | Discharge status: Home / Self Care | Payer: Medicare Other | Source: Ambulatory Visit | Attending: Gastroenterology | Admitting: Gastroenterology

## 2012-10-10 DIAGNOSIS — R109 Unspecified abdominal pain: Secondary | ICD-10-CM | POA: Insufficient documentation

## 2012-10-10 DIAGNOSIS — K2970 Gastritis, unspecified, without bleeding: Secondary | ICD-10-CM | POA: Insufficient documentation

## 2012-10-10 DIAGNOSIS — R110 Nausea: Secondary | ICD-10-CM | POA: Insufficient documentation

## 2012-10-10 MED ORDER — TECHNETIUM TC 99M SULFUR COLLOID
2.2000 | Freq: Once | INTRAVENOUS | Status: AC | PRN
  Administered 2012-10-10: 2.2 via INTRAVENOUS

## 2012-10-12 ENCOUNTER — Other Ambulatory Visit: Payer: Medicare Other

## 2012-10-12 ENCOUNTER — Ambulatory Visit (INDEPENDENT_AMBULATORY_CARE_PROVIDER_SITE_OTHER): Payer: Medicare Other | Admitting: Internal Medicine

## 2012-10-12 ENCOUNTER — Encounter: Payer: Self-pay | Admitting: Internal Medicine

## 2012-10-12 ENCOUNTER — Other Ambulatory Visit: Payer: Self-pay | Admitting: Internal Medicine

## 2012-10-12 VITALS — BP 110/80 | HR 89 | Temp 97.0°F | Ht 60.0 in | Wt 145.1 lb

## 2012-10-12 DIAGNOSIS — W57XXXA Bitten or stung by nonvenomous insect and other nonvenomous arthropods, initial encounter: Secondary | ICD-10-CM

## 2012-10-12 DIAGNOSIS — M810 Age-related osteoporosis without current pathological fracture: Secondary | ICD-10-CM

## 2012-10-12 DIAGNOSIS — K219 Gastro-esophageal reflux disease without esophagitis: Secondary | ICD-10-CM

## 2012-10-12 DIAGNOSIS — J309 Allergic rhinitis, unspecified: Secondary | ICD-10-CM

## 2012-10-12 DIAGNOSIS — T148 Other injury of unspecified body region: Secondary | ICD-10-CM

## 2012-10-12 DIAGNOSIS — G43909 Migraine, unspecified, not intractable, without status migrainosus: Secondary | ICD-10-CM

## 2012-10-12 MED ORDER — FLUTICASONE PROPIONATE 50 MCG/ACT NA SUSP: 2.0000 | Freq: Every day | NASAL | Status: DC

## 2012-10-12 MED ORDER — METHYLPREDNISOLONE ACETATE 80 MG/ML IJ SUSP
80.0000 mg | Freq: Once | INTRAMUSCULAR | Status: AC
  Administered 2012-10-12: 80 mg via INTRAMUSCULAR

## 2012-10-12 MED ORDER — CETIRIZINE HCL 10 MG PO TABS: 10.0000 mg | ORAL_TABLET | Freq: Every day | ORAL | Status: DC

## 2012-10-12 NOTE — Assessment & Plan Note (Signed)
With mild seasonal flare, for depomedrol IM, and zyrtect, flonase asd,  to f/u any worsening symptoms or concerns

## 2012-10-12 NOTE — Assessment & Plan Note (Signed)
Mild to mod, for antibx course,  to f/u any worsening symptoms or concerns 

## 2012-10-12 NOTE — Patient Instructions (Addendum)
You had the steroid shot today OK to stop the Estradiol Please take all new medication as prescribed - the zyrtec and flonase Please continue all other medications as before, and refills have been done if requested. Please go to the LAB in the Basement (turn left off the elevator) for the tests to be done today  - the test for lyme disease You will be contacted by phone if any changes need to be made immediately.  Otherwise, you will receive a letter about your results with an explanation, but please check with MyChart first. Thank you for enrolling in MyChart. Please follow the instructions below to securely access your online medical record. MyChart allows you to send messages to your doctor, view your test results, renew your prescriptions, schedule appointments, and more. Please keep your appointments with your specialists as you have planned Please return in 6 months, or sooner if needed

## 2012-10-12 NOTE — Assessment & Plan Note (Signed)
Ok to stop the estradiol, has been taking approx 30 yrs,  to f/u any worsening symptoms or concerns

## 2012-10-12 NOTE — Progress Notes (Signed)
Subjective:    Patient ID: Laura Barnes, female    DOB: 11-28-1940, 72 y.o.   MRN: [id number]  HPI  Here to f/u; overall doing ok,  Pt denies chest pain, increased sob or doe, wheezing, orthopnea, PND, increased LE swelling, palpitations, dizziness or syncope.  Pt denies polydipsia, polyuria, or low sugar symptoms such as weakness or confusion improved with po intake.  Pt denies new neurological symptoms such as new headache, or facial or extremity weakness or numbness.   Pt states overall good compliance with meds, has been trying to follow lower cholesterol diet, with wt overall stable. Dog diagnosed with Lyme dz, and pt had 3 tick bites 3 days ago, with small rash noted at one site left abdomen. Migraine improved on higher dose topamax per neurology, and Denies worsening reflux, abd pain, dysphagia, n/v, bowel change or blood on nexium 40 bid.  Does have several wks ongoing nasal allergy symptoms with clearish congestion, itch and sneezing, without fever, pain, ST, cough, swelling or wheezing. Past Medical History  Diagnosis Date  . HYPERLIPIDEMIA   . ANXIETY   . DEPRESSION   . MIGRAINE, COMMON   . MIGRAINE HEADACHE   . VENOUS INSUFFICIENCY, CHRONIC   . SINUSITIS- ACUTE-NOS   . ALLERGIC RHINITIS   . GERD   . IBS   . VAGINITIS   . SKIN LESION   . Enthesopathy of hip region   . FOOT PAIN, BILATERAL   . PLANTAR FASCIITIS   . OSTEOPOROSIS   . OSTEOPENIA   . RASH-NONVESICULAR   . NECK MASS   . Hiatal hernia    Past Surgical History  Procedure Laterality Date  . Appendectomy    . Cholecystectomy    . Abdominal hysterectomy    . 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 Oophorectomy    . Shoulder surgery    . S/p neck lump removal    . Breast enhancement surgery      reports that she has never smoked. She has never used smokeless tobacco. She reports that she does not drink alcohol or use illicit drugs. family history includes Breast cancer in  her maternal grandmother; Diabetes in her mother, other, and sister; Heart disease in her maternal aunt and maternal uncle; and Kidney disease in her paternal uncle.  There is no history of Colon cancer, and Colon polyps, and Rectal cancer, and Stomach cancer, . Allergies  Allergen Reactions  . Amoxicillin-Pot Clavulanate   . Aspirin   . Doxycycline   . Erythromycin   . Hydrocodone     REACTION: itch  . Hydrocodone-Acetaminophen   . Levofloxacin     Headaches, GI upset  . Methylphenidate Hcl   . Mirtazapine   . Nsaids     REACTION: stomach bleeding per pt  . Prednisone   . Rofecoxib   . Sertraline Hcl   . Sulfonamide Derivatives   . Sumatriptan    Current Outpatient Prescriptions on File Prior to Visit  Medication Sig Dispense Refill  . ALPRAZolam (XANAX) 0.5 MG tablet Take 1 tablet (0.5 mg total) by mouth 2 (two) times daily as needed.  60 tablet  5  . buPROPion (WELLBUTRIN XL) 300 MG 24 hr tablet Take 1 tablet (300 mg total) by mouth daily.  90 tablet  3  . citalopram (CELEXA) 10 MG tablet Take 1 tablet (10 mg total) by mouth daily.  90 tablet  3  . clidinium-chlordiazePOXIDE (LIBRAX) 2.5-5 MG per capsule  Take 1 capsule by mouth 3 (three) times daily as needed.  90 capsule  1  . esomeprazole (NEXIUM) 40 MG capsule Take 1 capsule (40 mg total) by mouth 2 (two) times daily.  60 capsule  3  . levothyroxine (LEVOTHROID) 25 MCG tablet Take 1 tablet (25 mcg total) by mouth daily.  90 tablet  3  . potassium chloride (KLOR-CON 10) 10 MEQ tablet Take 1 tablet (10 mEq total) by mouth daily.  90 tablet  3  . topiramate (TOPAMAX) 50 MG tablet Take 150 mg by mouth 2 (two) times daily.      Marland Kitchen triamcinolone cream (KENALOG) 0.5 % Apply topically 2 (two) times daily. As needed for rash  30 g  0  . triamterene-hydrochlorothiazide (MAXZIDE-25) 37.5-25 MG per tablet Take 1 each (1 tablet total) by mouth daily. 1/2 - 1 by mouth once daily as needed for swelling  90 tablet  3   No current  facility-administered medications on file prior to visit.   Review of Systems  Constitutional: Negative for unexpected weight change, or unusual diaphoresis  HENT: Negative for tinnitus.   Eyes: Negative for photophobia and visual disturbance.  Respiratory: Negative for choking and stridor.   Gastrointestinal: Negative for vomiting and blood in stool.  Genitourinary: Negative for hematuria and decreased urine volume.  Musculoskeletal: Negative for acute joint swelling Skin: Negative for color change and wound.  Neurological: Negative for tremors and numbness other than noted  Psychiatric/Behavioral: Negative for decreased concentration or  hyperactivity.       Objective:   Physical Exam BP 110/80  Pulse 89  Temp(Src) 97 F (36.1 C) (Oral)  Ht 5' (1.524 m)  Wt 145 lb 2 oz (65.828 kg)  BMI 28.34 kg/m2  SpO2 93% VS noted,  Constitutional: Pt appears well-developed and well-nourished.  HENT: Head: NCAT.  Right Ear: External ear normal.  Left Ear: External ear normal.  Bilat tm's with mild erythema.  Max sinus areas non tender.  Pharynx with mild erythema, no exudate Eyes: Conjunctivae and EOM are normal. Pupils are equal, round, and reactive to light.  Neck: Normal range of motion. Neck supple.  Cardiovascular: Normal rate and regular rhythm.   Pulmonary/Chest: Effort normal and breath sounds normal.  Abd:  Soft, NT, non-distended, + BS Neurological: Pt is alert. Not confused  Skin: Skin is warm. No erythema.  Psychiatric: Pt behavior is normal. Thought content normal. tense demeanor, anxious    Assessment & Plan:

## 2012-10-12 NOTE — Assessment & Plan Note (Signed)
stable overall by history and exam, recent data reviewed with pt, and pt to continue medical treatment as before,  to f/u any worsening symptoms or concerns Lab Results  Component Value Date   WBC 12.6* 07/12/2012   HGB 14.3 07/12/2012   HCT 42.2 07/12/2012   PLT 273.0 07/12/2012   GLUCOSE 92 07/12/2012   GLUCOSE 92 07/12/2012   CHOL 208* 03/31/2012   TRIG 150.0* 03/31/2012   HDL 74.60 03/31/2012   LDLDIRECT 110.8 03/31/2012   LDLCALC 82 04/07/2011   ALT 24 07/12/2012   ALT 24 07/12/2012   AST 30 07/12/2012   AST 30 07/12/2012   NA 137 09/20/2012   K 4.5 09/20/2012   CL 104 09/20/2012   CREATININE 0.9 07/12/2012   CREATININE 0.9 07/12/2012   BUN 13 07/12/2012   BUN 13 07/12/2012   CO2 27 09/20/2012   TSH 2.01 07/12/2012

## 2012-10-12 NOTE — Telephone Encounter (Signed)
Faxed hardcopy to [facility] [address] 

## 2012-10-12 NOTE — Assessment & Plan Note (Signed)
Improved on higher dose topamax,  to f/u any worsening symptoms or concerns

## 2012-10-12 NOTE — Telephone Encounter (Signed)
Done hardcopy to robin  

## 2012-10-13 LAB — LYME IGG/IGM AB: Lyme Ab: <0.91 index (ref 0.00–0.90)

## 2012-11-03 ENCOUNTER — Other Ambulatory Visit: Payer: Self-pay | Admitting: Internal Medicine

## 2012-11-15 ENCOUNTER — Ambulatory Visit (INDEPENDENT_AMBULATORY_CARE_PROVIDER_SITE_OTHER): Payer: Medicare Other | Admitting: Internal Medicine

## 2012-11-15 ENCOUNTER — Encounter: Payer: Self-pay | Admitting: Internal Medicine

## 2012-11-15 VITALS — BP 120/82 | HR 94 | Temp 98.7°F | Ht 61.0 in | Wt 142.4 lb

## 2012-11-15 DIAGNOSIS — N951 Menopausal and female climacteric states: Secondary | ICD-10-CM

## 2012-11-15 DIAGNOSIS — IMO0001 Reserved for inherently not codable concepts without codable children: Secondary | ICD-10-CM

## 2012-11-15 DIAGNOSIS — R232 Flushing: Secondary | ICD-10-CM | POA: Insufficient documentation

## 2012-11-15 DIAGNOSIS — J309 Allergic rhinitis, unspecified: Secondary | ICD-10-CM

## 2012-11-15 DIAGNOSIS — W57XXXA Bitten or stung by nonvenomous insect and other nonvenomous arthropods, initial encounter: Secondary | ICD-10-CM

## 2012-11-15 MED ORDER — ESTRADIOL 1 MG PO TABS: 0.5000 mg | ORAL_TABLET | Freq: Every day | ORAL | Status: DC

## 2012-11-15 MED ORDER — METHYLPREDNISOLONE ACETATE 80 MG/ML IJ SUSP
80.0000 mg | Freq: Once | INTRAMUSCULAR | Status: AC
  Administered 2012-11-15: 80 mg via INTRAMUSCULAR

## 2012-11-15 MED ORDER — PREDNISONE 10 MG PO TABS: ORAL_TABLET | ORAL | Status: DC

## 2012-11-15 MED ORDER — CEPHALEXIN 500 MG PO CAPS: 500.0000 mg | ORAL_CAPSULE | Freq: Four times a day (QID) | ORAL | Status: DC

## 2012-11-15 NOTE — Patient Instructions (Signed)
You had the steroid shot today Please take all new medication as prescribed - the antibiotic, prednisone and re-starting the estrace Please continue all other medications as before Please have the pharmacy call with any other refills you may need.  Please remember to sign up for My Chart if you have not done so, as this will be important to you in the future with finding out test results, communicating by private email, and scheduling acute appointments online when needed.

## 2012-11-15 NOTE — Progress Notes (Signed)
Subjective:    Patient ID: Laura Barnes, female    DOB: 01-18-1941, 72 y.o.   MRN: [id number]  HPI  Here with 2-3 days onset left mid upper back with red/tender/swelling area after tick bite.  Pt denies chest pain, increased sob or doe, wheezing, orthopnea, PND, increased LE swelling, palpitations, dizziness or syncope.  Pt denies new neurological symptoms such as new headache, or facial or extremity weakness or numbness   Pt denies polydipsia, polyuria.  Does have several wks ongoing nasal allergy symptoms with clearish congestion, itch and sneezing, without fever, pain, ST, cough, swelling or wheezing.  Has hot flashes too many to ignore after coming off estrace, asks to re-start at lower dose. Past Medical History  Diagnosis Date  . HYPERLIPIDEMIA   . ANXIETY   . DEPRESSION   . MIGRAINE, COMMON   . MIGRAINE HEADACHE   . VENOUS INSUFFICIENCY, CHRONIC   . SINUSITIS- ACUTE-NOS   . ALLERGIC RHINITIS   . GERD   . IBS   . VAGINITIS   . SKIN LESION   . Enthesopathy of hip region   . FOOT PAIN, BILATERAL   . PLANTAR FASCIITIS   . OSTEOPOROSIS   . OSTEOPENIA   . RASH-NONVESICULAR   . NECK MASS   . Hiatal hernia    Past Surgical History  Procedure Laterality Date  . Appendectomy    . Cholecystectomy    . Abdominal hysterectomy    . 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 Oophorectomy    . Shoulder surgery    . S/p neck lump removal    . Breast enhancement surgery      reports that she has never smoked. She has never used smokeless tobacco. She reports that she does not drink alcohol or use illicit drugs. family history includes Breast cancer in her maternal grandmother; Diabetes in her mother, other, and sister; Heart disease in her maternal aunt and maternal uncle; and Kidney disease in her paternal uncle.  There is no history of Colon cancer, and Colon polyps, and Rectal cancer, and Stomach cancer, . Allergies  Allergen Reactions  .  Amoxicillin-Pot Clavulanate   . Aspirin   . Doxycycline   . Erythromycin   . Hydrocodone     REACTION: itch  . Hydrocodone-Acetaminophen   . Levofloxacin     Headaches, GI upset  . Methylphenidate Hcl   . Mirtazapine   . Nsaids     REACTION: stomach bleeding per pt  . Prednisone   . Rofecoxib   . Sertraline Hcl   . Sulfonamide Derivatives   . Sumatriptan    Current Outpatient Prescriptions on File Prior to Visit  Medication Sig Dispense Refill  . ALPRAZolam (XANAX) 0.5 MG tablet Take 1 tablet (0.5 mg total) by mouth 2 (two) times daily as needed.  60 tablet  5  . ALPRAZolam (XANAX) 0.5 MG tablet TAKE 1 TABLET BY MOUTH TWICE A DAY AS NEEDED  60 tablet  2  . buPROPion (WELLBUTRIN XL) 300 MG 24 hr tablet Take 1 tablet (300 mg total) by mouth daily.  90 tablet  3  . cetirizine (ZYRTEC) 10 MG tablet Take 1 tablet (10 mg total) by mouth daily.  30 tablet  11  . citalopram (CELEXA) 10 MG tablet Take 1 tablet (10 mg total) by mouth daily.  90 tablet  3  . clidinium-chlordiazePOXIDE (LIBRAX) 2.5-5 MG per capsule Take 1 capsule by mouth 3 (three) times daily as  needed.  90 capsule  1  . esomeprazole (NEXIUM) 40 MG capsule Take 1 capsule (40 mg total) by mouth 2 (two) times daily.  60 capsule  3  . fluticasone (FLONASE) 50 MCG/ACT nasal spray Place 2 sprays into the nose daily.  16 g  5  . levothyroxine (LEVOTHROID) 25 MCG tablet Take 1 tablet (25 mcg total) by mouth daily.  90 tablet  3  . potassium chloride (KLOR-CON 10) 10 MEQ tablet Take 1 tablet (10 mEq total) by mouth daily.  90 tablet  3  . topiramate (TOPAMAX) 50 MG tablet Take 150 mg by mouth 2 (two) times daily.      Marland Kitchen triamcinolone cream (KENALOG) 0.5 % Apply topically 2 (two) times daily. As needed for rash  30 g  0  . triamterene-hydrochlorothiazide (MAXZIDE-25) 37.5-25 MG per tablet TAKE 1/2 TO 1 TABLET ONCE A DAY AS NEEDED FOR SWELLING  90 tablet  3   No current facility-administered medications on file prior to visit.    Review of Systems  Constitutional: Negative for unexpected weight change, or unusual diaphoresis  HENT: Negative for tinnitus.   Eyes: Negative for photophobia and visual disturbance.  Respiratory: Negative for choking and stridor.   Gastrointestinal: Negative for vomiting and blood in stool.  Genitourinary: Negative for hematuria and decreased urine volume.  Musculoskeletal: Negative for acute joint swelling Skin: Negative for color change and wound.  Neurological: Negative for tremors and numbness other than noted  Psychiatric/Behavioral: Negative for decreased concentration or  hyperactivity.       Objective:   Physical Exam BP 120/82  Pulse 94  Temp(Src) 98.7 F (37.1 C) (Oral)  Ht 5\' 1"  (1.549 m)  Wt 142 lb 6 oz (64.581 kg)  BMI 26.92 kg/m2  SpO2 97% VS noted,  Constitutional: Pt appears well-developed and well-nourished.  HENT: Head: NCAT.  Right Ear: External ear normal.  Left Ear: External ear normal.  Bilat tm's with mild erythema.  Max sinus areas mild tender.  Pharynx with mild erythema, no exudate Eyes: Conjunctivae and EOM are normal. Pupils are equal, round, and reactive to light.  Neck: Normal range of motion. Neck supple.  Cardiovascular: Normal rate and regular rhythm.   Pulmonary/Chest: Effort normal and breath sounds normal.  Neurological: Pt is alert. Not confused  Skin: Skin is warm. No erythema. right mid upper back red/tender/swelling area 1.5 cm Psychiatric: Pt behavior is normal. Thought content normal.     Assessment & Plan:

## 2012-11-15 NOTE — Assessment & Plan Note (Signed)
Right upper back, Mild to mod, for antibx course,  to f/u any worsening symptoms or concerns

## 2012-11-16 NOTE — Assessment & Plan Note (Signed)
For low dose estrace asd,  to f/u any worsening symptoms or concerns

## 2012-11-16 NOTE — Assessment & Plan Note (Addendum)
With seasonal flare for depomedrol IM and predpack asd, o/w stable overall by history and exam, recent data reviewed with pt, and pt to continue medical treatment as before,  to f/u any worsening symptoms or concerns Lab Results  Component Value Date   WBC 12.6* 07/12/2012   HGB 14.3 07/12/2012   HCT 42.2 07/12/2012   PLT 273.0 07/12/2012   GLUCOSE 92 07/12/2012   GLUCOSE 92 07/12/2012   CHOL 208* 03/31/2012   TRIG 150.0* 03/31/2012   HDL 74.60 03/31/2012   LDLDIRECT 110.8 03/31/2012   LDLCALC 82 04/07/2011   ALT 24 07/12/2012   ALT 24 07/12/2012   AST 30 07/12/2012   AST 30 07/12/2012   NA 137 09/20/2012   K 4.5 09/20/2012   CL 104 09/20/2012   CREATININE 0.9 07/12/2012   CREATININE 0.9 07/12/2012   BUN 13 07/12/2012   BUN 13 07/12/2012   CO2 27 09/20/2012   TSH 2.01 07/12/2012

## 2013-01-24 ENCOUNTER — Other Ambulatory Visit: Payer: Self-pay | Admitting: Gastroenterology

## 2013-01-31 ENCOUNTER — Other Ambulatory Visit: Payer: Self-pay | Admitting: Internal Medicine

## 2013-02-10 ENCOUNTER — Other Ambulatory Visit: Payer: Self-pay | Admitting: Internal Medicine

## 2013-02-10 NOTE — Telephone Encounter (Signed)
Done hardcopy to robin  

## 2013-02-10 NOTE — Telephone Encounter (Signed)
Faxed hardcopy to [facility] [address] [place] 

## 2013-04-06 ENCOUNTER — Other Ambulatory Visit: Payer: Self-pay

## 2013-04-06 ENCOUNTER — Ambulatory Visit (INDEPENDENT_AMBULATORY_CARE_PROVIDER_SITE_OTHER): Payer: Medicare Other

## 2013-04-06 DIAGNOSIS — E785 Hyperlipidemia, unspecified: Secondary | ICD-10-CM

## 2013-04-06 DIAGNOSIS — Z0000 Encounter for general adult medical examination without abnormal findings: Secondary | ICD-10-CM

## 2013-04-06 LAB — HEPATIC FUNCTION PANEL
ALT: 27 U/L (ref 0–35)
AST: 26 U/L (ref 0–37)
Albumin: 4 g/dL (ref 3.5–5.2)
Alkaline Phosphatase: 46 U/L (ref 39–117)
Bilirubin, Direct: 0.1 mg/dL (ref 0.0–0.3)
Total Bilirubin: 0.7 mg/dL (ref 0.3–1.2)
Total Protein: 7.7 g/dL (ref 6.0–8.3)

## 2013-04-06 LAB — TSH: TSH: 2.91 u[IU]/mL (ref 0.35–5.50)

## 2013-04-06 LAB — CBC WITH DIFFERENTIAL/PLATELET
Basophils Absolute: 0 10*3/uL (ref 0.0–0.1)
Basophils Relative: 0.2 % (ref 0.0–3.0)
Eosinophils Absolute: 0.3 10*3/uL (ref 0.0–0.7)
Eosinophils Relative: 2.7 % (ref 0.0–5.0)
HCT: 40.4 % (ref 36.0–46.0)
Hemoglobin: 13.9 g/dL (ref 12.0–15.0)
Lymphocytes Relative: 26.4 % (ref 12.0–46.0)
Lymphs Abs: 2.6 10*3/uL (ref 0.7–4.0)
MCHC: 34.3 g/dL (ref 30.0–36.0)
MCV: 95.1 fl (ref 78.0–100.0)
Monocytes Absolute: 0.9 10*3/uL (ref 0.1–1.0)
Monocytes Relative: 9.5 % (ref 3.0–12.0)
Neutro Abs: 6.1 10*3/uL (ref 1.4–7.7)
Neutrophils Relative %: 61.2 % (ref 43.0–77.0)
Platelets: 255 10*3/uL (ref 150.0–400.0)
RBC: 4.25 Mil/uL (ref 3.87–5.11)
RDW: 14.2 % (ref 11.5–14.6)
WBC: 9.9 10*3/uL (ref 4.5–10.5)

## 2013-04-06 LAB — LDL CHOLESTEROL, DIRECT: Direct LDL: 119.6 mg/dL

## 2013-04-06 LAB — BASIC METABOLIC PANEL
BUN: 14 mg/dL (ref 6–23)
CO2: 26 mEq/L (ref 19–32)
Calcium: 9.7 mg/dL (ref 8.4–10.5)
Chloride: 105 mEq/L (ref 96–112)
Creatinine, Ser: 1.2 mg/dL (ref 0.4–1.2)
GFR: 48.77 mL/min — ABNORMAL LOW (ref >60.00)
Glucose, Bld: 99 mg/dL (ref 70–99)
Potassium: 3.9 mEq/L (ref 3.5–5.1)
Sodium: 140 mEq/L (ref 135–145)

## 2013-04-06 LAB — LIPID PANEL
Cholesterol: 226 mg/dL — ABNORMAL HIGH (ref 0–200)
HDL: 78.3 mg/dL (ref >39.00)
Total CHOL/HDL Ratio: 3
Triglycerides: 101 mg/dL (ref 0.0–149.0)
VLDL: 20.2 mg/dL (ref 0.0–40.0)

## 2013-04-07 LAB — URINALYSIS, ROUTINE W REFLEX MICROSCOPIC
Bilirubin Urine: NEGATIVE
Hgb urine dipstick: NEGATIVE
Ketones, ur: NEGATIVE
Leukocytes, UA: NEGATIVE
Nitrite: NEGATIVE
Specific Gravity, Urine: 1.01 (ref 1.000–1.030)
Total Protein, Urine: NEGATIVE
Urine Glucose: NEGATIVE
Urobilinogen, UA: 0.2 (ref 0.0–1.0)
pH: 6.5 (ref 5.0–8.0)

## 2013-04-10 ENCOUNTER — Encounter: Payer: Medicare Other | Admitting: Internal Medicine

## 2013-04-12 ENCOUNTER — Encounter: Payer: Self-pay | Admitting: Internal Medicine

## 2013-04-12 ENCOUNTER — Ambulatory Visit (INDEPENDENT_AMBULATORY_CARE_PROVIDER_SITE_OTHER): Payer: Medicare Other | Admitting: Internal Medicine

## 2013-04-12 VITALS — BP 130/82 | HR 90 | Temp 97.8°F | Ht 61.0 in | Wt 145.0 lb

## 2013-04-12 DIAGNOSIS — R053 Chronic cough: Secondary | ICD-10-CM | POA: Insufficient documentation

## 2013-04-12 DIAGNOSIS — R05 Cough: Secondary | ICD-10-CM

## 2013-04-12 DIAGNOSIS — Z136 Encounter for screening for cardiovascular disorders: Secondary | ICD-10-CM

## 2013-04-12 DIAGNOSIS — Z0000 Encounter for general adult medical examination without abnormal findings: Secondary | ICD-10-CM

## 2013-04-12 DIAGNOSIS — E785 Hyperlipidemia, unspecified: Secondary | ICD-10-CM

## 2013-04-12 DIAGNOSIS — J329 Chronic sinusitis, unspecified: Secondary | ICD-10-CM

## 2013-04-12 DIAGNOSIS — Z23 Encounter for immunization: Secondary | ICD-10-CM

## 2013-04-12 DIAGNOSIS — R059 Cough, unspecified: Secondary | ICD-10-CM

## 2013-04-12 MED ORDER — LEVOTHYROXINE SODIUM 25 MCG PO TABS: 25.0000 ug | ORAL_TABLET | Freq: Every day | ORAL | Status: DC

## 2013-04-12 MED ORDER — AZITHROMYCIN 250 MG PO TABS: ORAL_TABLET | ORAL | Status: DC

## 2013-04-12 MED ORDER — BUPROPION HCL ER (XL) 300 MG PO TB24: 300.0000 mg | ORAL_TABLET | Freq: Every day | ORAL | Status: DC

## 2013-04-12 MED ORDER — ESOMEPRAZOLE MAGNESIUM 40 MG PO CPDR: 40.0000 mg | DELAYED_RELEASE_CAPSULE | Freq: Two times a day (BID) | ORAL | Status: DC

## 2013-04-12 MED ORDER — ATORVASTATIN CALCIUM 10 MG PO TABS: 10.0000 mg | ORAL_TABLET | Freq: Every day | ORAL | Status: DC

## 2013-04-12 MED ORDER — TRIAMTERENE-HCTZ 37.5-25 MG PO TABS: ORAL_TABLET | ORAL | Status: DC

## 2013-04-12 MED ORDER — ESTRADIOL 1 MG PO TABS: 0.5000 mg | ORAL_TABLET | Freq: Every day | ORAL | Status: DC

## 2013-04-12 MED ORDER — POTASSIUM CHLORIDE ER 10 MEQ PO TBCR: 10.0000 meq | EXTENDED_RELEASE_TABLET | Freq: Every day | ORAL | Status: DC

## 2013-04-12 NOTE — Assessment & Plan Note (Signed)

## 2013-04-12 NOTE — Progress Notes (Signed)
Subjective:    Patient ID: Laura Barnes, female    DOB: 20-Apr-1941, 72 y.o.   MRN: [id number]  HPI Here for wellness and f/u;  Overall doing ok;  Pt denies CP, worsening SOB, DOE, wheezing, orthopnea, PND, worsening LE edema, palpitations, dizziness or syncope.  Pt denies neurological change such as new headache, facial or extremity weakness.  Pt denies polydipsia, polyuria, or low sugar symptoms. Pt states overall good compliance with treatment and medications, good tolerability, and has been trying to follow lower cholesterol diet.  Pt denies worsening depressive symptoms, suicidal ideation or panic. No fever, night sweats, wt loss, loss of appetite, or other constitutional symptoms.  Pt states good ability with ADL's, has low fall risk, home safety reviewed and adequate, no other significant changes in hearing or vision, and only occasionally active with exercise.  Also -  Here with 2-3 days acute onset fever, facial pain, pressure, headache, general weakness and malaise, and greenish d/c, with mild ST and cough, acute on chronic sinus pain.  Has ongoing chronic cough as well for 2 yrs, asks for cxr.   Past Medical History  Diagnosis Date  . HYPERLIPIDEMIA   . ANXIETY   . DEPRESSION   . MIGRAINE, COMMON   . MIGRAINE HEADACHE   . VENOUS INSUFFICIENCY, CHRONIC   . SINUSITIS- ACUTE-NOS   . ALLERGIC RHINITIS   . GERD   . IBS   . VAGINITIS   . SKIN LESION   . Enthesopathy of hip region   . FOOT PAIN, BILATERAL   . PLANTAR FASCIITIS   . OSTEOPOROSIS   . OSTEOPENIA   . RASH-NONVESICULAR   . NECK MASS   . Hiatal hernia    Past Surgical History  Procedure Laterality Date  . Appendectomy    . Cholecystectomy    . Abdominal hysterectomy    . 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 Oophorectomy    . Shoulder surgery    . S/p neck lump removal    . Breast enhancement surgery      reports that she has never smoked. She has never used smokeless  tobacco. She reports that she does not drink alcohol or use illicit drugs. family history includes Breast cancer in her maternal grandmother; Diabetes in her mother, other, and sister; Heart disease in her maternal aunt and maternal uncle; Kidney disease in her paternal uncle. There is no history of Colon cancer, Colon polyps, Rectal cancer, or Stomach cancer. Allergies  Allergen Reactions  . Amoxicillin-Pot Clavulanate   . Aspirin   . Doxycycline   . Erythromycin   . Hydrocodone     REACTION: itch  . Hydrocodone-Acetaminophen   . Levofloxacin     Headaches, GI upset  . Methylphenidate Hcl   . Mirtazapine   . Nsaids     REACTION: stomach bleeding per pt  . Prednisone   . Rofecoxib   . Sertraline Hcl   . Sulfonamide Derivatives   . Sumatriptan    Current Outpatient Prescriptions on File Prior to Visit  Medication Sig Dispense Refill  . ALPRAZolam (XANAX) 0.5 MG tablet TAKE 1 TABLET BY MOUTH TWICE A DAY AS NEEDED  60 tablet  2  . ALPRAZolam (XANAX) 0.5 MG tablet TAKE 1 TABLET BY MOUTH TWICE A DAY AS NEEDED  60 tablet  2  . cetirizine (ZYRTEC) 10 MG tablet Take 1 tablet (10 mg total) by mouth daily.  30 tablet  11  . clidinium-chlordiazePOXIDE (  LIBRAX) 2.5-5 MG per capsule Take 1 capsule by mouth 3 (three) times daily as needed.  90 capsule  1  . fluticasone (FLONASE) 50 MCG/ACT nasal spray Place 2 sprays into the nose daily.  16 g  5  . triamcinolone cream (KENALOG) 0.5 % Apply topically 2 (two) times daily. As needed for rash  30 g  0  . citalopram (CELEXA) 10 MG tablet Take 1 tablet (10 mg total) by mouth daily.  90 tablet  3  . topiramate (TOPAMAX) 50 MG tablet Take 150 mg by mouth 2 (two) times daily.       No current facility-administered medications on file prior to visit.   Review of Systems Constitutional: Negative for diaphoresis, activity change, appetite change or unexpected weight change.  HENT: Negative for hearing loss, ear pain, facial swelling, mouth sores and neck  stiffness.   Eyes: Negative for pain, redness and visual disturbance.  Respiratory: Negative for shortness of breath and wheezing.   Cardiovascular: Negative for chest pain and palpitations.  Gastrointestinal: Negative for diarrhea, blood in stool, abdominal distention or other pain Genitourinary: Negative for hematuria, flank pain or change in urine volume.  Musculoskeletal: Negative for myalgias and joint swelling.  Skin: Negative for color change and wound.  Neurological: Negative for syncope and numbness. other than noted Hematological: Negative for adenopathy.  Psychiatric/Behavioral: Negative for hallucinations, self-injury, decreased concentration and agitation.      Objective:   Physical Exam BP 130/82  Pulse 90  Temp(Src) 97.8 F (36.6 C) (Oral)  Ht 5\' 1"  (1.549 m)  Wt 145 lb (65.772 kg)  BMI 27.41 kg/m2  SpO2 97% VS noted, mild ill Constitutional: Pt is oriented to person, place, and time. Appears well-developed and well-nourished.  Head: Normocephalic and atraumatic.  Right Ear: External ear normal.  Left Ear: External ear normal.  Nose: Nose normal.  Mouth/Throat: Oropharynx is clear and moist.  Bilat tm's with mild erythema.  Max sinus areas mild tender.  Pharynx with mild erythema, no exudate Eyes: Conjunctivae and EOM are normal. Pupils are equal, round, and reactive to light.  Neck: Normal range of motion. Neck supple. No JVD present. No tracheal deviation present.  Cardiovascular: Normal rate, regular rhythm, normal heart sounds and intact distal pulses.   Pulmonary/Chest: Effort normal and breath sounds normal.  Abdominal: Soft. Bowel sounds are normal. There is no tenderness. No HSM  Musculoskeletal: Normal range of motion. Exhibits no edema.  Lymphadenopathy:  Has no cervical adenopathy.  Neurological: Pt is alert and oriented to person, place, and time. Pt has normal reflexes. No cranial nerve deficit.  Skin: Skin is warm and dry. No rash noted.   Psychiatric:  Has  normal mood and affect. Behavior is normal.         Assessment & Plan:

## 2013-04-12 NOTE — Progress Notes (Signed)
Pre-visit discussion using our clinic review tool. No additional management support is needed unless otherwise documented below in the visit note.  

## 2013-04-12 NOTE — Patient Instructions (Signed)
You had the flu shot today Please take all new medication as prescribed - the zpack, and the lipitor 10 mg per day Plesae make Nurse Visit appt for the Prevnar pneumonia shot in 2 wks Please continue all other medications as before, and refills have been done if requested. Please have the pharmacy call with any other refills you may need. Please continue your efforts at being more active, low cholesterol diet, and weight control. You are otherwise up to date with prevention measures today.  You will be contacted regarding the referral for: ENT  Please go to the XRAY Department in the Basement (go straight as you get off the elevator) for the x-ray testing You will be contacted by phone if any changes need to be made immediately.  Otherwise, you will receive a letter about your results with an explanation, but please check with MyChart first.  Please remember to sign up for My Chart if you have not done so, as this will be important to you in the future with finding out test results, communicating by private email, and scheduling acute appointments online when needed.  Please return in 6 months, or sooner if needed, with Lab testing done 3-5 days before

## 2013-04-12 NOTE — Assessment & Plan Note (Signed)
With flare, for zpack, cont zyrtec, pt requests ENT eval

## 2013-04-12 NOTE — Assessment & Plan Note (Signed)
For lipitor 10 qd, cont lower chol diet

## 2013-04-12 NOTE — Assessment & Plan Note (Signed)
X 2 yrs - for cxr

## 2013-05-02 ENCOUNTER — Telehealth: Payer: Self-pay | Admitting: Internal Medicine

## 2013-05-02 ENCOUNTER — Ambulatory Visit (INDEPENDENT_AMBULATORY_CARE_PROVIDER_SITE_OTHER): Payer: Medicare Other

## 2013-05-02 ENCOUNTER — Ambulatory Visit (INDEPENDENT_AMBULATORY_CARE_PROVIDER_SITE_OTHER)
Admission: RE | Admit: 2013-05-02 | Discharge: 2013-05-02 | Disposition: A | Discharge status: Home / Self Care | Payer: Medicare Other | Source: Ambulatory Visit | Attending: Internal Medicine | Admitting: Internal Medicine

## 2013-05-02 DIAGNOSIS — R059 Cough, unspecified: Secondary | ICD-10-CM

## 2013-05-02 DIAGNOSIS — R05 Cough: Secondary | ICD-10-CM

## 2013-05-02 DIAGNOSIS — Z23 Encounter for immunization: Secondary | ICD-10-CM

## 2013-05-02 DIAGNOSIS — R911 Solitary pulmonary nodule: Secondary | ICD-10-CM

## 2013-05-02 NOTE — Telephone Encounter (Signed)
Message copied by Corwin Levins on Tue May 02, 2013 10:36 AM ------      Message from: Pincus Sanes      Created: Tue May 02, 2013 10:36 AM       Patient would like chest xray ------

## 2013-05-02 NOTE — Telephone Encounter (Signed)
Done

## 2013-05-05 NOTE — Addendum Note (Signed)
Addended by: Corwin Levins on: 05/05/2013 10:09 AM   Modules accepted: Orders

## 2013-05-05 NOTE — Telephone Encounter (Signed)
The patient called to inform she did see her cxr results on mychart and even though  A CT is not recommended she would like one scheduled

## 2013-05-05 NOTE — Telephone Encounter (Signed)
This has been ordered 

## 2013-05-05 NOTE — Telephone Encounter (Signed)
Patient informed. 

## 2013-05-12 ENCOUNTER — Ambulatory Visit (INDEPENDENT_AMBULATORY_CARE_PROVIDER_SITE_OTHER)
Admission: RE | Admit: 2013-05-12 | Discharge: 2013-05-12 | Disposition: A | Discharge status: Home / Self Care | Payer: Medicare Other | Source: Ambulatory Visit | Attending: Internal Medicine | Admitting: Internal Medicine

## 2013-05-12 DIAGNOSIS — R05 Cough: Secondary | ICD-10-CM

## 2013-05-12 DIAGNOSIS — R911 Solitary pulmonary nodule: Secondary | ICD-10-CM

## 2013-05-12 DIAGNOSIS — R059 Cough, unspecified: Secondary | ICD-10-CM

## 2013-05-18 ENCOUNTER — Other Ambulatory Visit: Payer: Self-pay | Admitting: Internal Medicine

## 2013-05-22 ENCOUNTER — Other Ambulatory Visit: Payer: Self-pay | Admitting: Gastroenterology

## 2013-05-22 ENCOUNTER — Other Ambulatory Visit: Payer: Self-pay | Admitting: Internal Medicine

## 2013-06-07 ENCOUNTER — Other Ambulatory Visit: Payer: Self-pay | Admitting: Internal Medicine

## 2013-06-08 NOTE — Telephone Encounter (Signed)
Done hardcopy to robin/staff

## 2013-06-08 NOTE — Telephone Encounter (Signed)
Ok to refill? Last OV 11.12.14 Last filled 5.14.14

## 2013-06-12 ENCOUNTER — Telehealth: Payer: Self-pay

## 2013-06-12 NOTE — Telephone Encounter (Signed)
The patient is hoping to get a call back from Gary at her earliest convenience.  The patient was told she would not be in the office until tomorrow.

## 2013-06-13 NOTE — Telephone Encounter (Signed)
Returned the patients call.  She has been having violent headaches, reflux, heartburn, abdominal pain, joint/muscle pain and nausea.  States she was seen on Jan. 5, 2015 by her neurologist and informed of symptoms.  Her neurologist informed she was highly allergic to statins, as he was aware she took Lipitor.  She stopped Lipitor as of Jan. 5 and was informed by her neurologist to take CoQ10 and red yeast rice.   The patients symptoms are getting much better since stopping the Lipitor.

## 2013-06-13 NOTE — Telephone Encounter (Signed)
All noted, no other suggestions at this time

## 2013-06-19 ENCOUNTER — Other Ambulatory Visit: Payer: Self-pay | Admitting: Otolaryngology

## 2013-06-21 ENCOUNTER — Encounter (HOSPITAL_COMMUNITY): Payer: Self-pay | Admitting: Pharmacy Technician

## 2013-06-21 NOTE — Addendum Note (Signed)
Addended by: [name] on: 06/21/2013 08:41 AM   Modules accepted: Orders

## 2013-06-23 ENCOUNTER — Encounter (HOSPITAL_COMMUNITY)
Admission: RE | Admit: 2013-06-23 | Discharge: 2013-06-23 | Disposition: A | Discharge status: Home / Self Care | Payer: Medicare Other | Source: Ambulatory Visit | Attending: Otolaryngology | Admitting: Otolaryngology

## 2013-06-23 ENCOUNTER — Encounter (HOSPITAL_COMMUNITY): Payer: Self-pay

## 2013-06-23 ENCOUNTER — Other Ambulatory Visit (HOSPITAL_COMMUNITY): Payer: Self-pay | Admitting: *Deleted

## 2013-06-23 DIAGNOSIS — Z01812 Encounter for preprocedural laboratory examination: Secondary | ICD-10-CM | POA: Insufficient documentation

## 2013-06-23 DIAGNOSIS — Z01818 Encounter for other preprocedural examination: Secondary | ICD-10-CM | POA: Insufficient documentation

## 2013-06-23 HISTORY — DX: Hypothyroidism, unspecified: E03.9

## 2013-06-23 HISTORY — DX: Anemia, unspecified: D64.9

## 2013-06-23 HISTORY — DX: Other complications of anesthesia, initial encounter: T88.59XA

## 2013-06-23 HISTORY — DX: Adverse effect of unspecified anesthetic, initial encounter: T41.45XA

## 2013-06-23 HISTORY — DX: Family history of other specified conditions: Z84.89

## 2013-06-23 HISTORY — DX: Bronchitis, not specified as acute or chronic: J40

## 2013-06-23 LAB — BASIC METABOLIC PANEL
BUN: 12 mg/dL (ref 6–23)
CO2: 23 mEq/L (ref 19–32)
Calcium: 9.2 mg/dL (ref 8.4–10.5)
Chloride: 104 mEq/L (ref 96–112)
Creatinine, Ser: 0.94 mg/dL (ref 0.50–1.10)
GFR calc Af Amer: 69 mL/min — ABNORMAL LOW (ref >90)
GFR calc non Af Amer: 59 mL/min — ABNORMAL LOW (ref >90)
Glucose, Bld: 100 mg/dL — ABNORMAL HIGH (ref 70–99)
Potassium: 4.4 mEq/L (ref 3.7–5.3)
Sodium: 139 mEq/L (ref 137–147)

## 2013-06-23 LAB — CBC
HCT: 39.2 % (ref 36.0–46.0)
Hemoglobin: 13.3 g/dL (ref 12.0–15.0)
MCH: 32.6 pg (ref 26.0–34.0)
MCHC: 33.9 g/dL (ref 30.0–36.0)
MCV: 96.1 fL (ref 78.0–100.0)
Platelets: 260 10*3/uL (ref 150–400)
RBC: 4.08 MIL/uL (ref 3.87–5.11)
RDW: 13.7 % (ref 11.5–15.5)
WBC: 9.1 10*3/uL (ref 4.0–10.5)

## 2013-06-23 NOTE — Pre-Procedure Instructions (Signed)
[name] Moder  06/23/2013   Your procedure is scheduled on:  Friday, June 30, 2013 at 7:30 AM.   Report to [facility] Entrance "A" Admitting Office at 5:30 AM.   Call this number if you have problems the morning of surgery: [phone]   Remember:   Do not eat food or drink liquids after midnight Thursday, 06/29/13.   Take these medicines the morning of surgery with A SIP OF WATER: buPROPion (WELLBUTRIN XL), cetirizine (ZYRTEC), citalopram (CELEXA), levothyroxine (SYNTHROID, LEVOTHROID), topiramate (TOPAMAX), fluticasone (FLONASE) - if needed, ALPRAZolam Duanne Moron) - if needed.   Stop all Vitamins, Herbal Medications and Fish Oil as of today, 06/23/13.    Do not wear jewelry, make-up or nail polish.  Do not wear lotions, powders, or perfumes. You may wear deodorant.  Do not shave 48 hours prior to surgery.   Do not bring valuables to the hospital.  [facility] is not responsible                  for any belongings or valuables.               Contacts, dentures or bridgework may not be worn into surgery.  Leave suitcase in the car. After surgery it may be brought to your room.  For patients admitted to the hospital, discharge time is determined by your                treatment team.               Patients discharged the day of surgery will not be allowed to drive  home.  Name and phone number of your driver: Family/friend   Special Instructions: Shower using CHG 2 nights before surgery and the night before surgery.  If you shower the day of surgery use CHG.  Use special wash - you have one bottle of CHG for all showers.  You should use approximately 1/3 of the bottle for each shower.   Please read over the following fact sheets that you were given: Pain Booklet, Coughing and Deep Breathing and Surgical Site Infection Prevention

## 2013-06-29 ENCOUNTER — Encounter (HOSPITAL_COMMUNITY): Payer: Self-pay | Admitting: Certified Registered Nurse Anesthetist

## 2013-06-30 ENCOUNTER — Encounter (HOSPITAL_COMMUNITY)
Admission: RE | Disposition: A | Discharge status: Home / Self Care | Payer: Self-pay | Source: Ambulatory Visit | Attending: Otolaryngology

## 2013-06-30 ENCOUNTER — Encounter (HOSPITAL_COMMUNITY): Payer: Medicare Other | Admitting: Certified Registered Nurse Anesthetist

## 2013-06-30 ENCOUNTER — Ambulatory Visit (HOSPITAL_COMMUNITY)
Admission: RE | Admit: 2013-06-30 | Discharge: 2013-06-30 | Disposition: A | Discharge status: Home / Self Care | Payer: Medicare Other | Source: Ambulatory Visit | Attending: Otolaryngology | Admitting: Otolaryngology

## 2013-06-30 ENCOUNTER — Encounter (HOSPITAL_COMMUNITY): Payer: Self-pay | Admitting: *Deleted

## 2013-06-30 ENCOUNTER — Ambulatory Visit (HOSPITAL_COMMUNITY): Payer: Medicare Other | Admitting: Certified Registered Nurse Anesthetist

## 2013-06-30 DIAGNOSIS — J323 Chronic sphenoidal sinusitis: Secondary | ICD-10-CM | POA: Insufficient documentation

## 2013-06-30 DIAGNOSIS — J329 Chronic sinusitis, unspecified: Secondary | ICD-10-CM

## 2013-06-30 DIAGNOSIS — R05 Cough: Secondary | ICD-10-CM | POA: Insufficient documentation

## 2013-06-30 DIAGNOSIS — K449 Diaphragmatic hernia without obstruction or gangrene: Secondary | ICD-10-CM | POA: Insufficient documentation

## 2013-06-30 DIAGNOSIS — R059 Cough, unspecified: Secondary | ICD-10-CM | POA: Insufficient documentation

## 2013-06-30 DIAGNOSIS — K219 Gastro-esophageal reflux disease without esophagitis: Secondary | ICD-10-CM | POA: Insufficient documentation

## 2013-06-30 DIAGNOSIS — E039 Hypothyroidism, unspecified: Secondary | ICD-10-CM | POA: Insufficient documentation

## 2013-06-30 HISTORY — PX: SINUS ENDO W/FUSION: SHX777

## 2013-06-30 SURGERY — SINUS SURGERY, ENDOSCOPIC, USING COMPUTER-ASSISTED NAVIGATION
Anesthesia: General | Site: Nose | Laterality: Right

## 2013-06-30 MED ORDER — PROPOFOL 10 MG/ML IV BOLUS
INTRAVENOUS | Status: DC | PRN
  Administered 2013-06-30: 150 mg via INTRAVENOUS

## 2013-06-30 MED ORDER — FENTANYL CITRATE 0.05 MG/ML IJ SOLN
25.0000 ug | INTRAMUSCULAR | Status: DC | PRN
  Administered 2013-06-30: 25 ug via INTRAVENOUS

## 2013-06-30 MED ORDER — EPHEDRINE SULFATE 50 MG/ML IJ SOLN
INTRAMUSCULAR | Status: AC
  Filled 2013-06-30: qty 1

## 2013-06-30 MED ORDER — ONDANSETRON HCL 4 MG/2ML IJ SOLN
INTRAMUSCULAR | Status: AC
  Filled 2013-06-30: qty 2

## 2013-06-30 MED ORDER — NEOSTIGMINE METHYLSULFATE 1 MG/ML IJ SOLN
INTRAMUSCULAR | Status: DC | PRN
  Administered 2013-06-30: 3 mg via INTRAVENOUS

## 2013-06-30 MED ORDER — PROPOFOL 10 MG/ML IV BOLUS
INTRAVENOUS | Status: AC
  Filled 2013-06-30: qty 20

## 2013-06-30 MED ORDER — FENTANYL CITRATE 0.05 MG/ML IJ SOLN
INTRAMUSCULAR | Status: DC | PRN
  Administered 2013-06-30 (×3): 50 ug via INTRAVENOUS

## 2013-06-30 MED ORDER — ONDANSETRON HCL 4 MG/2ML IJ SOLN: 4.0000 mg | Freq: Four times a day (QID) | INTRAMUSCULAR | Status: DC | PRN

## 2013-06-30 MED ORDER — MIDAZOLAM HCL 2 MG/2ML IJ SOLN
INTRAMUSCULAR | Status: AC
  Filled 2013-06-30: qty 2

## 2013-06-30 MED ORDER — OXYMETAZOLINE HCL 0.05 % NA SOLN
NASAL | Status: AC
  Filled 2013-06-30: qty 15

## 2013-06-30 MED ORDER — MUPIROCIN CALCIUM 2 % EX CREA
TOPICAL_CREAM | CUTANEOUS | Status: AC
  Filled 2013-06-30: qty 15

## 2013-06-30 MED ORDER — FENTANYL CITRATE 0.05 MG/ML IJ SOLN
INTRAMUSCULAR | Status: AC
  Filled 2013-06-30: qty 5

## 2013-06-30 MED ORDER — MIDAZOLAM HCL 5 MG/5ML IJ SOLN
INTRAMUSCULAR | Status: DC | PRN
  Administered 2013-06-30 (×2): 1 mg via INTRAVENOUS

## 2013-06-30 MED ORDER — SODIUM CHLORIDE 0.9 % IR SOLN
Status: DC | PRN
  Administered 2013-06-30: 1000 mL

## 2013-06-30 MED ORDER — ONDANSETRON HCL 4 MG/2ML IJ SOLN
INTRAMUSCULAR | Status: DC | PRN
  Administered 2013-06-30: 4 mg via INTRAVENOUS

## 2013-06-30 MED ORDER — TRIAMCINOLONE ACETONIDE 40 MG/ML IJ SUSP
INTRAMUSCULAR | Status: AC
  Filled 2013-06-30: qty 5

## 2013-06-30 MED ORDER — CLINDAMYCIN HCL 300 MG PO CAPS: 300.0000 mg | ORAL_CAPSULE | Freq: Three times a day (TID) | ORAL | Status: DC

## 2013-06-30 MED ORDER — LIDOCAINE HCL (CARDIAC) 20 MG/ML IV SOLN
INTRAVENOUS | Status: DC | PRN
  Administered 2013-06-30: 80 mg via INTRAVENOUS

## 2013-06-30 MED ORDER — STERILE WATER FOR IRRIGATION IR SOLN
Status: DC | PRN
  Administered 2013-06-30: 1000 mL

## 2013-06-30 MED ORDER — ONDANSETRON HCL 4 MG/2ML IJ SOLN
4.0000 mg | Freq: Four times a day (QID) | INTRAMUSCULAR | Status: AC | PRN
  Administered 2013-06-30: 4 mg via INTRAVENOUS

## 2013-06-30 MED ORDER — GLYCOPYRROLATE 0.2 MG/ML IJ SOLN
INTRAMUSCULAR | Status: AC
  Filled 2013-06-30: qty 2

## 2013-06-30 MED ORDER — SODIUM CHLORIDE 0.9 % IJ SOLN
INTRAMUSCULAR | Status: AC
  Filled 2013-06-30: qty 20

## 2013-06-30 MED ORDER — CLINDAMYCIN PHOSPHATE 600 MG/50ML IV SOLN
INTRAVENOUS | Status: DC | PRN
  Administered 2013-06-30: 600 mg via INTRAVENOUS

## 2013-06-30 MED ORDER — LACTATED RINGERS IV SOLN
INTRAVENOUS | Status: DC | PRN
  Administered 2013-06-30: 07:00:00 via INTRAVENOUS

## 2013-06-30 MED ORDER — FENTANYL CITRATE 0.05 MG/ML IJ SOLN: 25.0000 ug | INTRAMUSCULAR | Status: DC | PRN

## 2013-06-30 MED ORDER — ROCURONIUM BROMIDE 100 MG/10ML IV SOLN
INTRAVENOUS | Status: DC | PRN
  Administered 2013-06-30: 40 mg via INTRAVENOUS

## 2013-06-30 MED ORDER — LIDOCAINE-EPINEPHRINE 1 %-1:100000 IJ SOLN
INTRAMUSCULAR | Status: AC
  Filled 2013-06-30: qty 1

## 2013-06-30 MED ORDER — LIDOCAINE HCL (CARDIAC) 20 MG/ML IV SOLN
INTRAVENOUS | Status: AC
  Filled 2013-06-30: qty 5

## 2013-06-30 MED ORDER — FENTANYL CITRATE 0.05 MG/ML IJ SOLN
INTRAMUSCULAR | Status: AC
  Filled 2013-06-30: qty 2

## 2013-06-30 MED ORDER — CLINDAMYCIN PHOSPHATE 600 MG/50ML IV SOLN
INTRAVENOUS | Status: AC
  Filled 2013-06-30: qty 50

## 2013-06-30 MED ORDER — NEOSTIGMINE METHYLSULFATE 1 MG/ML IJ SOLN
INTRAMUSCULAR | Status: AC
  Filled 2013-06-30: qty 10

## 2013-06-30 MED ORDER — ROCURONIUM BROMIDE 50 MG/5ML IV SOLN
INTRAVENOUS | Status: AC
  Filled 2013-06-30: qty 1

## 2013-06-30 MED ORDER — OXYMETAZOLINE HCL 0.05 % NA SOLN
NASAL | Status: DC | PRN
  Administered 2013-06-30: 1

## 2013-06-30 MED ORDER — LIDOCAINE-EPINEPHRINE 1 %-1:100000 IJ SOLN
INTRAMUSCULAR | Status: DC | PRN
  Administered 2013-06-30: 8 mL

## 2013-06-30 MED ORDER — GLYCOPYRROLATE 0.2 MG/ML IJ SOLN
INTRAMUSCULAR | Status: DC | PRN
  Administered 2013-06-30: 0.4 mg via INTRAVENOUS

## 2013-06-30 SURGICAL SUPPLY — 44 items
ATTRACTOMAT 16X20 MAGNETIC DRP (DRAPES) IMPLANT
BLADE ROTATE RAD 40 4 M4 (BLADE) IMPLANT
BLADE ROTATE TRICUT 4X13 M4 (BLADE) ×2 IMPLANT
CANISTER SUCTION 2500CC (MISCELLANEOUS) ×4 IMPLANT
CLOTH BEACON ORANGE TIMEOUT ST (SAFETY) ×1 IMPLANT
COAGULATOR SUCT SWTCH 10FR 6 (ELECTROSURGICAL) ×1 IMPLANT
DECANTER SPIKE VIAL GLASS SM (MISCELLANEOUS) ×1 IMPLANT
DRESSING NASAL KENNEDY 3.5X.9 (MISCELLANEOUS) IMPLANT
DRSG NASAL KENNEDY 3.5X.9 (MISCELLANEOUS)
ELECT COATED BLADE 2.86 ST (ELECTRODE) IMPLANT
ELECT REM PT RETURN 9FT ADLT (ELECTROSURGICAL) ×2
ELECTRODE REM PT RTRN 9FT ADLT (ELECTROSURGICAL) ×1 IMPLANT
FILTER ARTHROSCOPY CONVERTOR (FILTER) ×1 IMPLANT
GLOVE BIOGEL M 7.0 STRL (GLOVE) ×2 IMPLANT
GLOVE SURG SS PI 7.0 STRL IVOR (GLOVE) ×2 IMPLANT
GOWN STRL NON-REIN LRG LVL3 (GOWN DISPOSABLE) ×2 IMPLANT
GOWN STRL REUS W/ TWL LRG LVL3 (GOWN DISPOSABLE) IMPLANT
GOWN STRL REUS W/TWL LRG LVL3 (GOWN DISPOSABLE) ×4
KIT BASIN OR (CUSTOM PROCEDURE TRAY) ×2 IMPLANT
KIT ROOM TURNOVER OR (KITS) ×2 IMPLANT
NDL 18GX1X1/2 (RX/OR ONLY) (NEEDLE) IMPLANT
NEEDLE 18GX1X1/2 (RX/OR ONLY) (NEEDLE) IMPLANT
NS IRRIG 1000ML POUR BTL (IV SOLUTION) ×2 IMPLANT
PAD ARMBOARD 7.5X6 YLW CONV (MISCELLANEOUS) ×4 IMPLANT
PAD ENT ADHESIVE 25PK (MISCELLANEOUS) ×2 IMPLANT
PENCIL BUTTON HOLSTER BLD 10FT (ELECTRODE) IMPLANT
SPECIMEN JAR SMALL (MISCELLANEOUS) ×2 IMPLANT
SPONGE NEURO XRAY DETECT 1X3 (DISPOSABLE) ×2 IMPLANT
SUT ETHILON 3 0 FSL (SUTURE) ×1 IMPLANT
SUT PLAIN 4 0 ~~LOC~~ 1 (SUTURE) ×1 IMPLANT
SWAB COLLECTION DEVICE MRSA (MISCELLANEOUS) IMPLANT
SYR CONTROL 10ML LL (SYRINGE) ×1 IMPLANT
TOWEL OR 17X24 6PK STRL BLUE (TOWEL DISPOSABLE) ×2 IMPLANT
TOWEL OR 17X26 10 PK STRL BLUE (TOWEL DISPOSABLE) ×2 IMPLANT
TRACKER ENT INSTRUMENT (MISCELLANEOUS) ×2 IMPLANT
TRACKER ENT PATIENT (MISCELLANEOUS) ×2 IMPLANT
TRAP SPECIMEN MUCOUS 40CC (MISCELLANEOUS) IMPLANT
TRAY ENT MC OR (CUSTOM PROCEDURE TRAY) ×2 IMPLANT
TUBE ANAEROBIC SPECIMEN COL (MISCELLANEOUS) IMPLANT
TUBE CONNECTING 12X1/4 (SUCTIONS) ×2 IMPLANT
TUBING EXTENTION W/L.L. (IV SETS) ×2 IMPLANT
TUBING STRAIGHTSHOT EPS 5PK (TUBING) ×2 IMPLANT
WATER STERILE IRR 1000ML POUR (IV SOLUTION) ×2 IMPLANT
WIPE INSTRUMENT VISIWIPE 73X73 (MISCELLANEOUS) ×2 IMPLANT

## 2013-06-30 NOTE — Anesthesia Preprocedure Evaluation (Addendum)
Anesthesia Evaluation  Patient identified by MRN, date of birth, ID band Patient awake    Reviewed: Allergy & Precautions, H&P , NPO status , Patient's Chart, lab work & pertinent test results  Airway Mallampati: II  Neck ROM: full    Dental  (+) Teeth Intact   Pulmonary  breath sounds clear to auscultation        Cardiovascular + Peripheral Vascular Disease Rhythm:Regular Rate:Normal     Neuro/Psych  Headaches, Anxiety Depression  Neuromuscular disease    GI/Hepatic hiatal hernia, GERD-  Medicated and Controlled,  Endo/Other  Hypothyroidism   Renal/GU      Musculoskeletal   Abdominal   Peds  Hematology   Anesthesia Other Findings   Reproductive/Obstetrics                         Anesthesia Physical Anesthesia Plan  ASA: II  Anesthesia Plan: General   Post-op Pain Management:    Induction: Intravenous  Airway Management Planned: Oral ETT  Additional Equipment:   Intra-op Plan:   Post-operative Plan: Extubation in OR  Informed Consent: I have reviewed the patients History and Physical, chart, labs and discussed the procedure including the risks, benefits and alternatives for the proposed anesthesia with the patient or authorized representative who has indicated his/her understanding and acceptance.   Dental advisory given  Plan Discussed with: CRNA, Anesthesiologist and Surgeon  Anesthesia Plan Comments:        Anesthesia Quick Evaluation

## 2013-06-30 NOTE — Anesthesia Postprocedure Evaluation (Signed)
Anesthesia Post Note  Patient: Laura Barnes  Procedure(s) Performed: Procedure(s) (LRB): RIGHT ENDOSCOPIC SPHENOIDECTOMY WITH FUSION SCAN (Right)  Anesthesia type: General  Patient location: PACU  Post pain: Pain level controlled and Adequate analgesia  Post assessment: Post-op Vital signs reviewed, Patient's Cardiovascular Status Stable, Respiratory Function Stable, Patent Airway and Pain level controlled  Last Vitals:  Filed Vitals:   06/30/13 0938  BP: 116/84  Pulse: 78  Temp: 36.4 C  Resp: 18    Post vital signs: Reviewed and stable  Level of consciousness: awake, alert  and oriented  Complications: No apparent anesthesia complications

## 2013-06-30 NOTE — Transfer of Care (Signed)
Immediate Anesthesia Transfer of Care Note  Patient: Laura Barnes  Procedure(s) Performed: Procedure(s): RIGHT ENDOSCOPIC SPHENOIDECTOMY WITH FUSION SCAN (Right)  Patient Location: PACU  Anesthesia Type:General  Level of Consciousness: awake, alert  and oriented  Airway & Oxygen Therapy: Patient Spontanous Breathing  Post-op Assessment: Report given to PACU RN  Post vital signs: Reviewed and stable  Complications: No apparent anesthesia complications

## 2013-06-30 NOTE — Brief Op Note (Signed)
06/30/2013  8:50 AM  PATIENT:  Laura Barnes  73 y.o. female  PRE-OPERATIVE DIAGNOSIS:  CHRONIC SINUSITIS  POST-OPERATIVE DIAGNOSIS:  CHRONIC SINUSITIS  PROCEDURE:  Procedure(s): RIGHT ENDOSCOPIC SPHENOIDECTOMY WITH FUSION SCAN (Right)  SURGEON:  Surgeon(s) and Role:    * [name], MD - Primary  PHYSICIAN ASSISTANT:   ASSISTANTS: none   ANESTHESIA:   general  EBL:   <50cc  BLOOD ADMINISTERED:none  DRAINS: none   LOCAL MEDICATIONS USED:  LIDOCAINE  and Amount: 3 ml  SPECIMEN:  Source of Specimen:  Rt sphenoid sinus  DISPOSITION OF SPECIMEN:  PATHOLOGY  COUNTS:  YES  TOURNIQUET:  * No tourniquets in log *  DICTATION: .Other Dictation: Dictation Number [id number]  PLAN OF CARE: Discharge to home after PACU  PATIENT DISPOSITION:  PACU - hemodynamically stable.   Delay start of Pharmacological VTE agent (>24hrs) due to surgical blood loss or risk of bleeding: not applicable

## 2013-06-30 NOTE — Discharge Instructions (Signed)

## 2013-06-30 NOTE — H&P (Signed)
Laura Barnes is an 73 y.o. female.   Chief Complaint: Chronic Sinusitis HPI: Chronic cough and d/c  Past Medical History  Diagnosis Date  . HYPERLIPIDEMIA   . ANXIETY   . DEPRESSION   . MIGRAINE, COMMON   . MIGRAINE HEADACHE   . VENOUS INSUFFICIENCY, CHRONIC   . SINUSITIS- ACUTE-NOS   . ALLERGIC RHINITIS   . GERD   . IBS   . VAGINITIS   . SKIN LESION   . Enthesopathy of hip region   . FOOT PAIN, BILATERAL   . PLANTAR FASCIITIS   . OSTEOPOROSIS   . OSTEOPENIA   . RASH-NONVESICULAR   . NECK MASS   . Hiatal hernia   . Hypothyroidism   . Bronchitis   . Anemia   . Complication of anesthesia     says one time waking up she couldn't breathe, felt like her throat closing up  . Family history of anesthesia complication     sister with n/v    Past Surgical History  Procedure Laterality Date  . Appendectomy    . Cholecystectomy    . 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 Oophorectomy    . Shoulder surgery    . S/p neck lump removal    . Breast enhancement surgery    . Vaginal hysterectomy    . Tonsillectomy    . Colonoscopy      Family History  Problem Relation Age of Onset  . Diabetes Mother   . Diabetes Sister   . Breast cancer Maternal Grandmother   . Heart disease Maternal Uncle     x 2  . Diabetes Other     multi relatives on father side  . Heart disease Maternal Aunt   . Kidney disease Paternal Uncle     questionable  . Colon cancer Neg Hx   . Colon polyps Neg Hx   . Rectal cancer Neg Hx   . Stomach cancer Neg Hx   . Heart disease Father    Social History:  reports that she has never smoked. She has never used smokeless tobacco. She reports that she does not drink alcohol or use illicit drugs.  Allergies:  Allergies  Allergen Reactions  . Prednisone Anaphylaxis  . Amoxicillin-Pot Clavulanate   . Aspirin     Stomach ache and bleed  . Doxycycline   . Erythromycin   . Hydrocodone Itching  .  Hydrocodone-Acetaminophen Itching  . Latex     blisters  . Levofloxacin     Headaches, GI upset  . Methylphenidate Hcl   . Mirtazapine   . Nsaids     REACTION: stomach bleeding per pt  . Rofecoxib   . Sertraline Hcl   . Statins Nausea Only    Headache, upset stomach, joint hurt, heartburn  . Sulfonamide Derivatives   . Sumatriptan     Medications Prior to Admission  Medication Sig Dispense Refill  . ALPRAZolam (XANAX) 0.5 MG tablet Take 0.5 mg by mouth 2 (two) times daily as needed for anxiety.      . B Complex-Biotin-FA (B COMPLETE) TABS Take 1 tablet by mouth daily. B Complete 100mg       . buPROPion (WELLBUTRIN XL) 300 MG 24 hr tablet Take 1 tablet (300 mg total) by mouth daily.  90 tablet  3  . calcium carbonate (OS-CAL) 600 MG TABS tablet Take 600 mg by mouth 2 (two) times daily with a meal.       .  cetirizine (ZYRTEC) 10 MG tablet Take 1 tablet (10 mg total) by mouth daily.  30 tablet  11  . cholecalciferol (VITAMIN D) 1000 UNITS tablet Take 1,000 Units by mouth daily.      . citalopram (CELEXA) 10 MG tablet Take 10 mg by mouth daily.      . clidinium-chlordiazePOXIDE (LIBRAX) 5-2.5 MG per capsule Take 1 capsule by mouth daily as needed (for stomach).      . Coenzyme Q10 (CO Q-10) 200 MG CAPS Take 1 capsule by mouth 2 (two) times daily.      Marland Kitchen esomeprazole (NEXIUM) 40 MG capsule Take 1 capsule (40 mg total) by mouth 2 (two) times daily before a meal.  60 capsule  11  . estradiol (ESTRACE) 1 MG tablet Take 0.5 tablets (0.5 mg total) by mouth daily.  45 tablet  3  . levothyroxine (SYNTHROID, LEVOTHROID) 25 MCG tablet Take 1 tablet (25 mcg total) by mouth daily before breakfast.  90 tablet  3  . Meperidine HCl (DEMEROL PO) Take by mouth. Takes for headaches unsure dose      . Multiple Vitamin (MULTIVITAMIN WITH MINERALS) TABS tablet Take 1 tablet by mouth daily.      . Omega-3 Fatty Acids (SALMON OIL-1000 PO) Take 1,000 mg by mouth daily.      . potassium chloride (KLOR-CON 10) 10  MEQ tablet Take 1 tablet (10 mEq total) by mouth daily.  90 tablet  3  . Red Yeast Rice Extract (RED YEAST RICE PO) Take 1 capsule by mouth 2 (two) times daily. Red Yeast Rice Complex      . topiramate (TOPAMAX) 50 MG tablet Take 50-250 mg by mouth 2 (two) times daily. Take 50mg  (1 tablet) daily in the morning.  Take 250mg  (5 tablets) daily at bedtime      . triamcinolone cream (KENALOG) 0.5 % Apply topically 2 (two) times daily. As needed for rash  30 g  0  . triamterene-hydrochlorothiazide (MAXZIDE-25) 37.5-25 MG per tablet Take 0.5 tablets by mouth daily.       . fluticasone (FLONASE) 50 MCG/ACT nasal spray Place 1 spray into both nostrils daily as needed for allergies or rhinitis.        No results found for this or any previous visit (from the past 48 hour(s)). No results found.  Review of Systems  Constitutional: Negative.   HENT: Negative.   Respiratory: Negative.   Cardiovascular: Negative.   Gastrointestinal: Negative.   Neurological: Negative.     Blood pressure 139/62, pulse 78, temperature 97.9 F (36.6 C), temperature source Oral, resp. rate 18, SpO2 98.00%. Physical Exam  Constitutional: She is oriented to person, place, and time. She appears well-developed and well-nourished.  Neck: Normal range of motion. Neck supple.  Cardiovascular: Normal rate and regular rhythm.   Respiratory: Effort normal.  GI: Soft.  Musculoskeletal: Normal range of motion.  Neurological: She is alert and oriented to person, place, and time.     Assessment/Plan Adm for OP sinus surgery.  [employer], Damary Doland 06/30/2013, 7:22 AM

## 2013-07-01 NOTE — Op Note (Signed)
NAME:  Laura Barnes, Laura Barnes NO.:  [id number]  MEDICAL RECORD NO.:  [id number]  LOCATION:  [facility]                         FACILITY:  [facility]  PHYSICIAN:  Early Chars. Wilburn Cornelia, M.D.DATE OF BIRTH:  03/18/1941  DATE OF PROCEDURE:  06/30/2013 DATE OF DISCHARGE:  06/30/2013                              OPERATIVE REPORT   PREOPERATIVE DIAGNOSES: 1. Chronic right sphenoid sinusitis. 2. Chronic cough and headache.  POSTOPERATIVE DIAGNOSES: 1. Chronic right sphenoid sinusitis. 2. Chronic cough and headache.  INDICATION FOR SURGERY: 1. Chronic right sphenoid sinusitis. 2. Chronic cough and headache.  SURGICAL PROCEDURE:  Right endoscopic sphenoidectomy with removal of diseased tissue and fusion navigation.  SURGEON:  Early Chars. Wilburn Cornelia, MD  ANESTHESIA:  General endotracheal.  COMPLICATIONS:  None.  ESTIMATED BLOOD LOSS:  Less than 50 mL.  The patient transferred from the operating room to the recovery room in stable condition.  BRIEF HISTORY:  The patient is a 73 year old white female who was referred to our office with symptoms of chronic occipital and vertex headaches, chronic nasal discharge and cough.  She had been treated with multiple courses of antibiotics over the last several years, and has had significant allergic reactions to medical therapy.  Despite appropriate therapy, she continued to have symptoms.  Post treatment CT scan of the sinuses showed opacification of the right sphenoid sinus consistent with chronic sphenoid sinusitis.  Given her history, examination, and findings with failure to respond to appropriate medical therapy, I recommended that we undertake endoscopic sinus surgery with right sphenoidotomy and removal of diseased tissue.  The risks and benefits of procedure were discussed in detail with the patient and her husband and they understood and concurred with our plan for surgery which is scheduled on elective basis at [facility] as an outpatient.  Prior to surgery, a complete axial CT scan of the sinus and fusion format was obtained and this was used for intraoperative computer- assisted navigation.  FINDING:  Soft tissue obstruction of the right sphenoid sinus ostium with thick mucopurulent material within the sphenoid sinus consistent with chronic sinusitis.  No packing was placed.  PROCEDURE:  The patient was brought to the operating room on June 30, 2013, placed in supine position on the operating table.  General endotracheal anesthesia was established without difficulty.  When the patient was adequately anesthetized, she was positioned on the operating table, and prepped and draped in a sterile fashion.  The nose was injected with a total of 3 mL of 1% lidocaine and 1:100,000 solution of epinephrine was injected in a submucosal fashion along the middle turbinate and the nasal septum and also via transoral sphenopalatine block.  The patient's nose was then packed with Afrin soaked cotton pledgets and were left in place for approximately 10 minutes to allow for vasoconstriction, hemostasis.  The fusion head gear was applied and anatomic and surgical landmarks were identified and confirmed.  The fusion device was used throughout the surgical procedure for intraoperative computer navigation.  The patient was prepared for surgery.  A 0 degree endoscope was used to examine the nasal passages bilaterally.  There was thick mucopurulent appearing material in the posterior sphenoid ethmoid region and along the  anterior face of the sphenoid rostrum and nasopharynx.  Using a Soil scientist, the middle turbinate was carefully lateralized.  The superior turbinate was identified, and the inferior edge was resected using a through cutting forceps and a straight microdebrider.  The natural ostium of the sphenoid sinus was identified along the anterior face of the sphenoid rostrum.  This was occluded with  polypoid material and a moderate amount of purulent drainage but the ostium itself was completely occluded.  Using the navigation tool to identify the natural ostium, the ostium was enlarged using a straight microdebrider in a medial and inferior direction to create a widely patent sphenoid sinus ostium.  With the sinus opened, inspection was then carried out.  There was thick mucopurulent material in the inferior aspect of the sinus consistent with chronic sinusitis.  Copious irrigation with approximately 60 mL of warm saline was used to clear all debris from within the sinus and followup inspection showed no evidence of residual disease or polypoid material.  The surgical ostium was cleared of debris.  There was no bleeding or exposed bone.  The surgical sponge count was correct.  The nasopharynx was suctioned and irrigated.  An orogastric tube was passed.  Stomach contents were aspirated.  The patient was then awakened from anesthetic, extubated, and transferred from the operating room to the recovery room in stable condition.  There were no complications.  The blood loss was less than 50 mL.          ______________________________ Early Chars. Wilburn Cornelia, M.D.     DLS/MEDQ  D:  88/50/2774  T:  07/01/2013  Job:  [id number]

## 2013-07-03 ENCOUNTER — Encounter (HOSPITAL_COMMUNITY): Payer: Self-pay | Admitting: Otolaryngology

## 2013-07-14 ENCOUNTER — Telehealth: Payer: Self-pay | Admitting: *Deleted

## 2013-07-14 MED ORDER — ESTRADIOL 1 MG PO TABS
1.0000 mg | ORAL_TABLET | Freq: Every day | ORAL | Status: DC
Start: 2013-07-14 — End: 2013-08-10

## 2013-07-14 NOTE — Telephone Encounter (Signed)
Patient phoned requesting refill on estradiol.  Last OV with PCP 04/12/2013.  Requesting refill on estradiol and also for it to be increased back to 1mg  (whole tab, not half tab).  Please advise.   CB# [phone]

## 2013-07-14 NOTE — Telephone Encounter (Signed)
Done erx 

## 2013-07-17 NOTE — Telephone Encounter (Signed)
Notified patient of MD response and order.

## 2013-08-10 ENCOUNTER — Telehealth: Payer: Self-pay | Admitting: Internal Medicine

## 2013-08-10 ENCOUNTER — Ambulatory Visit (INDEPENDENT_AMBULATORY_CARE_PROVIDER_SITE_OTHER): Payer: Medicare Other | Admitting: Internal Medicine

## 2013-08-10 ENCOUNTER — Encounter: Payer: Self-pay | Admitting: Internal Medicine

## 2013-08-10 ENCOUNTER — Other Ambulatory Visit (INDEPENDENT_AMBULATORY_CARE_PROVIDER_SITE_OTHER): Payer: Medicare Other

## 2013-08-10 VITALS — BP 140/92 | HR 99 | Temp 98.6°F | Ht 60.0 in | Wt 145.1 lb

## 2013-08-10 DIAGNOSIS — R300 Dysuria: Secondary | ICD-10-CM

## 2013-08-10 DIAGNOSIS — R350 Frequency of micturition: Secondary | ICD-10-CM

## 2013-08-10 DIAGNOSIS — J029 Acute pharyngitis, unspecified: Secondary | ICD-10-CM

## 2013-08-10 DIAGNOSIS — M545 Low back pain, unspecified: Secondary | ICD-10-CM

## 2013-08-10 LAB — URINALYSIS, ROUTINE W REFLEX MICROSCOPIC
Bilirubin Urine: NEGATIVE
Hgb urine dipstick: NEGATIVE
Ketones, ur: NEGATIVE
Leukocytes, UA: NEGATIVE
Nitrite: NEGATIVE
Specific Gravity, Urine: 1.01 (ref 1.000–1.030)
Total Protein, Urine: NEGATIVE
Urine Glucose: NEGATIVE
Urobilinogen, UA: 0.2 (ref 0.0–1.0)
pH: 7.5 (ref 5.0–8.0)

## 2013-08-10 MED ORDER — TRAMADOL HCL 50 MG PO TABS: 50.0000 mg | ORAL_TABLET | Freq: Four times a day (QID) | ORAL | Status: DC | PRN

## 2013-08-10 MED ORDER — NITROFURANTOIN MONOHYD MACRO 100 MG PO CAPS: 100.0000 mg | ORAL_CAPSULE | Freq: Two times a day (BID) | ORAL | Status: DC

## 2013-08-10 MED ORDER — ESTRADIOL 1 MG PO TABS: 1.0000 mg | ORAL_TABLET | Freq: Every day | ORAL | Status: DC

## 2013-08-10 NOTE — Telephone Encounter (Signed)
Message copied by Biagio Borg on Thu Aug 10, 2013  2:22 PM ------      Message from: Laura Barnes      Created: Thu Aug 10, 2013  2:20 PM       The patient has appt. At 4:30 for UTI and did agree to come to the lab before appt.  Advise on order for the lab. ------

## 2013-08-10 NOTE — Telephone Encounter (Signed)
Order done

## 2013-08-10 NOTE — Progress Notes (Signed)
Subjective:    Patient ID: Laura Barnes, female    DOB: April 07, 1941, 73 y.o.   MRN: [id number]  HPI   Here to f/u with c/o 3 days urinary symptoms such as dysuria, frequency, urgency, nausea and chills but no flank pain, hematuria or fever, chills.  Also  Here with 2-3 days acute onset fever, facial pain, pressure, headache, general weakness and malaise,  mild to mod ST  - no cough, but pt denies chest pain, wheezing, increased sob or doe, orthopnea, PND, increased LE swelling, palpitations, dizziness or syncope.Pt also with low mid sharp and dull LBP without change in severity, bowel or bladder change, fever, wt loss,  worsening LE pain/numbness/weakness, gait change or falls. Past Medical History  Diagnosis Date  . HYPERLIPIDEMIA   . ANXIETY   . DEPRESSION   . MIGRAINE, COMMON   . MIGRAINE HEADACHE   . VENOUS INSUFFICIENCY, CHRONIC   . SINUSITIS- ACUTE-NOS   . ALLERGIC RHINITIS   . GERD   . IBS   . VAGINITIS   . SKIN LESION   . Enthesopathy of hip region   . FOOT PAIN, BILATERAL   . PLANTAR FASCIITIS   . OSTEOPOROSIS   . OSTEOPENIA   . RASH-NONVESICULAR   . NECK MASS   . Hiatal hernia   . Hypothyroidism   . Bronchitis   . Anemia   . Complication of anesthesia     says one time waking up she couldn't breathe, felt like her throat closing up  . Family history of anesthesia complication     sister with n/v   Past Surgical History  Procedure Laterality Date  . Appendectomy    . Cholecystectomy    . 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 Oophorectomy    . Shoulder surgery    . S/p neck lump removal    . Breast enhancement surgery    . Vaginal hysterectomy    . Tonsillectomy    . Colonoscopy    . Sinus endo w/fusion Right 06/30/2013    Procedure: RIGHT ENDOSCOPIC SPHENOIDECTOMY WITH FUSION SCAN;  Surgeon: [name], MD;  Location: [facility];  Service: ENT;  Laterality: Right;    reports that she has never smoked. She has never  used smokeless tobacco. She reports that she does not drink alcohol or use illicit drugs. family history includes Breast cancer in her maternal grandmother; Diabetes in her mother, other, and sister; Heart disease in her father, maternal aunt, and maternal uncle; Kidney disease in her paternal uncle. There is no history of Colon cancer, Colon polyps, Rectal cancer, or Stomach cancer. Allergies  Allergen Reactions  . Prednisone Anaphylaxis  . Amoxicillin-Pot Clavulanate   . Aspirin     Stomach ache and bleed  . Doxycycline   . Erythromycin   . Hydrocodone Itching  . Hydrocodone-Acetaminophen Itching  . Latex     blisters  . Levofloxacin     Headaches, GI upset  . Methylphenidate Hcl   . Mirtazapine   . Nsaids     REACTION: stomach bleeding per pt  . Rofecoxib   . Sertraline Hcl   . Statins Nausea Only    Headache, upset stomach, joint hurt, heartburn  . Sulfonamide Derivatives   . Sumatriptan    Current Outpatient Prescriptions on File Prior to Visit  Medication Sig Dispense Refill  . ALPRAZolam (XANAX) 0.5 MG tablet Take 0.5 mg by mouth 2 (two) times daily as needed for  anxiety.      . B Complex-Biotin-FA (B COMPLETE) TABS Take 1 tablet by mouth daily. B Complete 100mg       . buPROPion (WELLBUTRIN XL) 300 MG 24 hr tablet Take 1 tablet (300 mg total) by mouth daily.  90 tablet  3  . calcium carbonate (OS-CAL) 600 MG TABS tablet Take 600 mg by mouth 2 (two) times daily with a meal.       . cetirizine (ZYRTEC) 10 MG tablet Take 1 tablet (10 mg total) by mouth daily.  30 tablet  11  . cholecalciferol (VITAMIN D) 1000 UNITS tablet Take 1,000 Units by mouth daily.      . citalopram (CELEXA) 10 MG tablet Take 10 mg by mouth daily.      . clidinium-chlordiazePOXIDE (LIBRAX) 5-2.5 MG per capsule Take 1 capsule by mouth daily as needed (for stomach).      . Coenzyme Q10 (CO Q-10) 200 MG CAPS Take 1 capsule by mouth 2 (two) times daily.      Marland Kitchen esomeprazole (NEXIUM) 40 MG capsule Take 1  capsule (40 mg total) by mouth 2 (two) times daily before a meal.  60 capsule  11  . levothyroxine (SYNTHROID, LEVOTHROID) 25 MCG tablet Take 1 tablet (25 mcg total) by mouth daily before breakfast.  90 tablet  3  . Meperidine HCl (DEMEROL PO) Take by mouth. Takes for headaches unsure dose      . Multiple Vitamin (MULTIVITAMIN WITH MINERALS) TABS tablet Take 1 tablet by mouth daily.      . Omega-3 Fatty Acids (SALMON OIL-1000 PO) Take 1,000 mg by mouth daily.      . potassium chloride (KLOR-CON 10) 10 MEQ tablet Take 1 tablet (10 mEq total) by mouth daily.  90 tablet  3  . Red Yeast Rice Extract (RED YEAST RICE PO) Take 1 capsule by mouth 2 (two) times daily. Red Yeast Rice Complex      . topiramate (TOPAMAX) 50 MG tablet Take 50-250 mg by mouth 2 (two) times daily. Take 50mg  (1 tablet) daily in the morning.  Take 250mg  (5 tablets) daily at bedtime      . triamcinolone cream (KENALOG) 0.5 % Apply topically 2 (two) times daily. As needed for rash  30 g  0  . triamterene-hydrochlorothiazide (MAXZIDE-25) 37.5-25 MG per tablet Take 0.5 tablets by mouth daily.        No current facility-administered medications on file prior to visit.   Review of Systems  Constitutional: Negative for unexpected weight change, or unusual diaphoresis  HENT: Negative for tinnitus.   Eyes: Negative for photophobia and visual disturbance.  Respiratory: Negative for choking and stridor.   Gastrointestinal: Negative for vomiting and blood in stool.  Genitourinary: Negative for hematuria and decreased urine volume.  Musculoskeletal: Negative for acute joint swelling Skin: Negative for color change and wound.  Neurological: Negative for tremors and numbness other than noted  Psychiatric/Behavioral: Negative for decreased concentration or  hyperactivity.       Objective:   Physical Exam BP 140/92  Pulse 99  Temp(Src) 98.6 F (37 C) (Oral)  Ht 5' (1.524 m)  Wt 145 lb 2 oz (65.828 kg)  BMI 28.34 kg/m2  SpO2 99% VS  noted, mild ill Constitutional: Pt appears well-developed and well-nourished.  HENT: Head: NCAT.  Right Ear: External ear normal.  Left Ear: External ear normal.  Bilat tm's with mild erythema.  Max sinus areas mild tender.  Pharynx with mild to mod erythema, no exudate Eyes:  Conjunctivae and EOM are normal. Pupils are equal, round, and reactive to light.  Neck: Normal range of motion. Neck supple.  Cardiovascular: Normal rate and regular rhythm.   Pulmonary/Chest: Effort normal and breath sounds normal.  Abd:  Soft, non-distended, + BS with mild low mid abd tender Neurological: Pt is alert. Not confused , motor 5/5 Spine nontender but with bilat lumbar paravertebral tender without swelling , redness Skin: Skin is warm. No erythema. No LE edema Psychiatric: Pt behavior is normal. Thought content normal.     Assessment & Plan:

## 2013-08-10 NOTE — Patient Instructions (Signed)
Please take all new medication as prescribed - the antibiotic, and the pain medication  Please continue all other medications as before, and refills have been done if requested - the estradiol  Please have the pharmacy call with any other refills you may need.  Your specimen results should be back in 2-3 days.  You will be contacted by phone if any changes need to be made immediately.  Otherwise, you will receive a letter about your results with an explanation, but please check with MyChart first.

## 2013-08-10 NOTE — Progress Notes (Signed)
Pre visit review using our clinic review tool, if applicable. No additional management support is needed unless otherwise documented below in the visit note. 

## 2013-08-12 DIAGNOSIS — R350 Frequency of micturition: Secondary | ICD-10-CM | POA: Insufficient documentation

## 2013-08-12 DIAGNOSIS — M545 Low back pain, unspecified: Secondary | ICD-10-CM | POA: Insufficient documentation

## 2013-08-12 DIAGNOSIS — J029 Acute pharyngitis, unspecified: Secondary | ICD-10-CM | POA: Insufficient documentation

## 2013-08-12 LAB — URINE CULTURE
Colony Count: NO GROWTH
Organism ID, Bacteria: NO GROWTH

## 2013-08-12 NOTE — Assessment & Plan Note (Signed)
?   Viral vs other, tx as above,  to f/u any worsening symptoms or concerns

## 2013-08-12 NOTE — Assessment & Plan Note (Signed)
Possible uti, for urine cx, empiric cephalexin asd,  to f/u any worsening symptoms or concerns

## 2013-08-12 NOTE — Assessment & Plan Note (Signed)
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

## 2013-09-06 ENCOUNTER — Telehealth: Payer: Self-pay

## 2013-09-06 ENCOUNTER — Encounter: Payer: Self-pay | Admitting: Internal Medicine

## 2013-09-06 ENCOUNTER — Other Ambulatory Visit (INDEPENDENT_AMBULATORY_CARE_PROVIDER_SITE_OTHER): Payer: Medicare Other

## 2013-09-06 ENCOUNTER — Ambulatory Visit (INDEPENDENT_AMBULATORY_CARE_PROVIDER_SITE_OTHER): Payer: Medicare Other | Admitting: Internal Medicine

## 2013-09-06 ENCOUNTER — Telehealth: Payer: Self-pay | Admitting: Internal Medicine

## 2013-09-06 VITALS — BP 120/82 | HR 111 | Temp 98.3°F | Ht 60.0 in | Wt 146.2 lb

## 2013-09-06 DIAGNOSIS — M25519 Pain in unspecified shoulder: Secondary | ICD-10-CM

## 2013-09-06 DIAGNOSIS — M797 Fibromyalgia: Secondary | ICD-10-CM | POA: Insufficient documentation

## 2013-09-06 DIAGNOSIS — M25512 Pain in left shoulder: Secondary | ICD-10-CM

## 2013-09-06 DIAGNOSIS — M25511 Pain in right shoulder: Secondary | ICD-10-CM

## 2013-09-06 DIAGNOSIS — IMO0001 Reserved for inherently not codable concepts without codable children: Secondary | ICD-10-CM

## 2013-09-06 DIAGNOSIS — E039 Hypothyroidism, unspecified: Secondary | ICD-10-CM

## 2013-09-06 LAB — CK: Total CK: 50 U/L (ref 7–177)

## 2013-09-06 LAB — SEDIMENTATION RATE: Sed Rate: 31 mm/hr — ABNORMAL HIGH (ref 0–22)

## 2013-09-06 MED ORDER — CYCLOBENZAPRINE HCL 5 MG PO TABS: 5.0000 mg | ORAL_TABLET | Freq: Three times a day (TID) | ORAL | Status: DC | PRN

## 2013-09-06 MED ORDER — DULOXETINE HCL 60 MG PO CPEP: 60.0000 mg | ORAL_CAPSULE | Freq: Every day | ORAL | Status: DC

## 2013-09-06 MED ORDER — TRAMADOL HCL 50 MG PO TABS: ORAL_TABLET | ORAL | Status: DC

## 2013-09-06 MED ORDER — DULOXETINE HCL 30 MG PO CPEP
ORAL_CAPSULE | ORAL | Status: DC
Start: 2013-09-06 — End: 2013-09-06

## 2013-09-06 NOTE — Telephone Encounter (Signed)
Returned call to inform [facility] had called left a detailed message of upcoming appointment with Dr. Tamala Julian 09/13/13 at 1:00.  Patient informed

## 2013-09-06 NOTE — Telephone Encounter (Signed)
Already on cymbalta and wellbutrin, so no other new medication needed

## 2013-09-06 NOTE — Patient Instructions (Signed)
Please take all new medication as prescribed - the muscle relaxer, tramadol, as well as the cymbalta  Start the cymbalta by taking 30 mg per day for 1 wk, then 60 mg per day after that  OK to stop the citalopram as does not seem to be working  Please go to the LAB in the Basement (turn left off the elevator) for the tests to be done today- for the sed rate and muscle enzyme testing  You will be contacted regarding the referral for: Dr Tamala Julian, Sport medicine  Please continue all other medications as before, and refills have been done if requested. Please have the pharmacy call with any other refills you may need.  Please continue your efforts at being more active, low cholesterol diet, and weight control.  Please keep your appointments with your specialists as you may have planned  Please remember to sign up for MyChart if you have not done so, as this will be important to you in the future with finding out test results, communicating by private email, and scheduling acute appointments online when needed.

## 2013-09-06 NOTE — Assessment & Plan Note (Signed)
stable overall by history and exam, recent data reviewed with pt, and pt to continue medical treatment as before,  to f/u any worsening symptoms or concerns Lab Results  Component Value Date   TSH 2.91 04/06/2013    

## 2013-09-06 NOTE — Telephone Encounter (Signed)
Patient called in stating she had a missed call.  She does not know if she has a voice mail or not.  She wanted to send you a note to let you know that she called back in if it was you calling and that you can give her a call back.

## 2013-09-06 NOTE — Progress Notes (Signed)
Pre visit review using our clinic review tool, if applicable. No additional management support is needed unless otherwise documented below in the visit note. 

## 2013-09-06 NOTE — Progress Notes (Signed)
Subjective:    Patient ID: Laura Barnes, female    DOB: 05-Jul-1940, 73 y.o.   MRN: [id number]  HPI  Here to f/u, has known DJD of hands, fingers, wrists; had recurring elbow pain she attributes to djd, also with bilat shoulder pain with UE weakness, hard to pick up pans to cook.  Also with soft tissue non joint related pain to arm (uppre and lower) , prox anterior thighs, and bilat upper back pain between shoulder blades.  Dx with recent TMJ, tx per oral surgeon.  Feels a type of burning like a fever, but no fever at home and none today.  Cbc/bmet normal jan 2015.  Did see neurology for "worse HA ever" and tx with nausea meds, and another med "like for cancer patients" that she cant recall the name.  Does also have bilat neck pain with pain to the shoudlers, pain makes her weak, had to put down a newborn baby recently after 10 min due to pain and weakness. Reminds me today that 50 mg tramadol normally is not enough - usually needs 2 pills. Past Medical History  Diagnosis Date  . HYPERLIPIDEMIA   . ANXIETY   . DEPRESSION   . MIGRAINE, COMMON   . MIGRAINE HEADACHE   . VENOUS INSUFFICIENCY, CHRONIC   . SINUSITIS- ACUTE-NOS   . ALLERGIC RHINITIS   . GERD   . IBS   . VAGINITIS   . SKIN LESION   . Enthesopathy of hip region   . FOOT PAIN, BILATERAL   . PLANTAR FASCIITIS   . OSTEOPOROSIS   . OSTEOPENIA   . RASH-NONVESICULAR   . NECK MASS   . Hiatal hernia   . Hypothyroidism   . Bronchitis   . Anemia   . Complication of anesthesia     says one time waking up she couldn't breathe, felt like her throat closing up  . Family history of anesthesia complication     sister with n/v   Past Surgical History  Procedure Laterality Date  . Appendectomy    . Cholecystectomy    . 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 Oophorectomy    . Shoulder surgery    . S/p neck lump removal    . Breast enhancement surgery    . Vaginal hysterectomy    .  Tonsillectomy    . Colonoscopy    . Sinus endo w/fusion Right 06/30/2013    Procedure: RIGHT ENDOSCOPIC SPHENOIDECTOMY WITH FUSION SCAN;  Surgeon: [name], MD;  Location: [facility];  Service: ENT;  Laterality: Right;    reports that she has never smoked. She has never used smokeless tobacco. She reports that she does not drink alcohol or use illicit drugs. family history includes Breast cancer in her maternal grandmother; Diabetes in her mother, other, and sister; Heart disease in her father, maternal aunt, and maternal uncle; Kidney disease in her paternal uncle. There is no history of Colon cancer, Colon polyps, Rectal cancer, or Stomach cancer. Allergies  Allergen Reactions  . Prednisone Anaphylaxis  . Amoxicillin-Pot Clavulanate   . Aspirin     Stomach ache and bleed  . Doxycycline   . Erythromycin   . Hydrocodone Itching  . Hydrocodone-Acetaminophen Itching  . Latex     blisters  . Levofloxacin     Headaches, GI upset  . Methylphenidate Hcl   . Mirtazapine   . Nsaids     REACTION: stomach bleeding per pt  .  Rofecoxib   . Sertraline Hcl   . Statins Nausea Only    Headache, upset stomach, joint hurt, heartburn  . Sulfonamide Derivatives   . Sumatriptan    Current Outpatient Prescriptions on File Prior to Visit  Medication Sig Dispense Refill  . ALPRAZolam (XANAX) 0.5 MG tablet Take 0.5 mg by mouth 2 (two) times daily as needed for anxiety.      . B Complex-Biotin-FA (B COMPLETE) TABS Take 1 tablet by mouth daily. B Complete 100mg       . buPROPion (WELLBUTRIN XL) 300 MG 24 hr tablet Take 1 tablet (300 mg total) by mouth daily.  90 tablet  3  . calcium carbonate (OS-CAL) 600 MG TABS tablet Take 600 mg by mouth 2 (two) times daily with a meal.       . cetirizine (ZYRTEC) 10 MG tablet Take 1 tablet (10 mg total) by mouth daily.  30 tablet  11  . cholecalciferol (VITAMIN D) 1000 UNITS tablet Take 1,000 Units by mouth daily.      . citalopram (CELEXA) 10 MG tablet Take 10 mg  by mouth daily.      . clidinium-chlordiazePOXIDE (LIBRAX) 5-2.5 MG per capsule Take 1 capsule by mouth daily as needed (for stomach).      . Coenzyme Q10 (CO Q-10) 200 MG CAPS Take 1 capsule by mouth 2 (two) times daily.      Marland Kitchen esomeprazole (NEXIUM) 40 MG capsule Take 1 capsule (40 mg total) by mouth 2 (two) times daily before a meal.  60 capsule  11  . estradiol (ESTRACE) 1 MG tablet Take 1 tablet (1 mg total) by mouth daily.  90 tablet  3  . levothyroxine (SYNTHROID, LEVOTHROID) 25 MCG tablet Take 1 tablet (25 mcg total) by mouth daily before breakfast.  90 tablet  3  . Meperidine HCl (DEMEROL PO) Take by mouth. Takes for headaches unsure dose      . Multiple Vitamin (MULTIVITAMIN WITH MINERALS) TABS tablet Take 1 tablet by mouth daily.      . nitrofurantoin, macrocrystal-monohydrate, (MACROBID) 100 MG capsule Take 1 capsule (100 mg total) by mouth 2 (two) times daily.  20 capsule  0  . Omega-3 Fatty Acids (SALMON OIL-1000 PO) Take 1,000 mg by mouth daily.      . potassium chloride (KLOR-CON 10) 10 MEQ tablet Take 1 tablet (10 mEq total) by mouth daily.  90 tablet  3  . Red Yeast Rice Extract (RED YEAST RICE PO) Take 1 capsule by mouth 2 (two) times daily. Red Yeast Rice Complex      . topiramate (TOPAMAX) 50 MG tablet Take 50-250 mg by mouth 2 (two) times daily. Take 50mg  (1 tablet) daily in the morning.  Take 250mg  (5 tablets) daily at bedtime      . traMADol (ULTRAM) 50 MG tablet Take 1 tablet (50 mg total) by mouth every 6 (six) hours as needed.  30 tablet  0  . triamcinolone cream (KENALOG) 0.5 % Apply topically 2 (two) times daily. As needed for rash  30 g  0  . triamterene-hydrochlorothiazide (MAXZIDE-25) 37.5-25 MG per tablet Take 0.5 tablets by mouth daily.        No current facility-administered medications on file prior to visit.    Review of Systems  Constitutional: Negative for unexpected weight change, or unusual diaphoresis  HENT: Negative for tinnitus.   Eyes: Negative for  photophobia and visual disturbance.  Respiratory: Negative for choking and stridor.   Gastrointestinal: Negative for vomiting  and blood in stool.  Genitourinary: Negative for hematuria and decreased urine volume.  Musculoskeletal: Negative for acute joint swelling Skin: Negative for color change and wound.  Neurological: Negative for tremors and numbness other than noted  Psychiatric/Behavioral: Negative for decreased concentration or  hyperactivity.       Objective:   Physical Exam BP 120/82  Pulse 111  Temp(Src) 98.3 F (36.8 C) (Oral)  Ht 5' (1.524 m)  Wt 146 lb 4 oz (66.339 kg)  BMI 28.56 kg/m2  SpO2 97% VS noted, grimacing in pain today Constitutional: Pt appears well-developed and well-nourished.  HENT: Head: NCAT.  Right Ear: External ear normal.  Left Ear: External ear normal.  Eyes: Conjunctivae and EOM are normal. Pupils are equal, round, and reactive to light.  Neck: Normal range of motion. Neck supple.  Cardiovascular: Normal rate and regular rhythm.   Pulmonary/Chest: Effort normal and breath sounds normal.  Abd:  Soft, NT, non-distended, + BS Neurological: Pt is alert. Not confused , motor 5/5 Has diffuse soft tissue tenderness of bilat upper back trigger points, lower back, upper and lower arms not assoc with joints Bilat shoulders also with r> l decr ROM, pain to abduction and forward elevation both limited by at least 10 degrees , o/w neurovasc intact UE's Skin: Skin is warm. No erythema. No LE edema Psychiatric: Pt behavior is normal. Thought content normal. 1-2+ nervous    Assessment & Plan:

## 2013-09-06 NOTE — Assessment & Plan Note (Signed)
Right > left, suspect bursitis on the left, prob underlying DJD bilat, cant r/o rot cuff for both as well; for tramadol prn, refer Dr Smith/sport med

## 2013-09-06 NOTE — Telephone Encounter (Signed)
The patient stated she can not take the Zoloft medication that was prescribed, due to being allergic.  She is hoping for an alternate medication  Pharmacy - [facility] on [address] [phone]

## 2013-09-06 NOTE — Assessment & Plan Note (Addendum)
Clinical dx, for cymbalta asd, and muscle relaxer prn,  to f/u any worsening symptoms or concerns, also for CK

## 2013-09-07 NOTE — Telephone Encounter (Signed)
Patient informed. 

## 2013-09-12 ENCOUNTER — Telehealth: Payer: Self-pay | Admitting: *Deleted

## 2013-09-12 MED ORDER — CYCLOBENZAPRINE HCL 5 MG PO TABS: 5.0000 mg | ORAL_TABLET | Freq: Three times a day (TID) | ORAL | Status: DC | PRN

## 2013-09-12 NOTE — Telephone Encounter (Signed)
Pt called states Flexeril needs to be Generic Rx in order for insurance to pay.  Pt further states she does not want to take the Cymbalta due to the information label.  She does not want to take anything that will upset her stomach and it also cost $90.  Please advise

## 2013-09-12 NOTE — Telephone Encounter (Signed)
Ok for generic for flexeril - done erx  Not sure what to say about the cymbalta, except to try to reassure her that the side effects only occur in very few persons, and the medication is for pain as much as stress and depression  If too expensive, though, we could consider other medication such as lexapro, but this will not help pain

## 2013-09-12 NOTE — Telephone Encounter (Signed)
Called the patient informed of generic medication sent in.  Also informed of information regarding the cymbalta.  The patient is not interested in taking Lexapro.

## 2013-09-13 ENCOUNTER — Ambulatory Visit (INDEPENDENT_AMBULATORY_CARE_PROVIDER_SITE_OTHER)
Admission: RE | Admit: 2013-09-13 | Discharge: 2013-09-13 | Disposition: A | Discharge status: Home / Self Care | Payer: Medicare Other | Source: Ambulatory Visit | Attending: Family Medicine | Admitting: Family Medicine

## 2013-09-13 ENCOUNTER — Encounter: Payer: Self-pay | Admitting: Family Medicine

## 2013-09-13 ENCOUNTER — Ambulatory Visit (INDEPENDENT_AMBULATORY_CARE_PROVIDER_SITE_OTHER): Payer: Medicare Other | Admitting: Family Medicine

## 2013-09-13 VITALS — BP 134/84 | HR 104 | Wt 146.0 lb

## 2013-09-13 DIAGNOSIS — M25512 Pain in left shoulder: Principal | ICD-10-CM

## 2013-09-13 DIAGNOSIS — M25519 Pain in unspecified shoulder: Secondary | ICD-10-CM

## 2013-09-13 DIAGNOSIS — M25511 Pain in right shoulder: Secondary | ICD-10-CM

## 2013-09-13 MED ORDER — CYCLOBENZAPRINE HCL 5 MG PO TABS
5.0000 mg | ORAL_TABLET | Freq: Three times a day (TID) | ORAL | Status: DC | PRN
Start: 2013-09-13 — End: 2013-10-10

## 2013-09-13 MED ORDER — CYCLOBENZAPRINE HCL 5 MG PO TABS: 5.0000 mg | ORAL_TABLET | Freq: Three times a day (TID) | ORAL | Status: DC | PRN

## 2013-09-13 MED ORDER — CITALOPRAM HYDROBROMIDE 10 MG PO TABS: 10.0000 mg | ORAL_TABLET | Freq: Two times a day (BID) | ORAL | Status: DC

## 2013-09-13 NOTE — Assessment & Plan Note (Signed)
I believe patient's bilateral shoulder pain is likely secondary to more for fibromyalgia. We discussed medications and is important. Patient was given a refill of his top Ramen we'll go a very low dose. We discussed that 20 mg may be beneficial the patient is going to try this for we can go back down to 10 mg. We wrote a prescription for 20 mg daily secondary to patient having more pills for lower cost. Patient given range of motion exercises are to be beneficial and we discussed over-the-counter medications are to be helpful for her underlying osteoarthritis that is likely also contributing. Patient will get x-rays of the cervical spine to make sure that there is no compression of be giving [place] this problem I think it is a low likelihood. Patient's multiple allergic reactions to medications will make treatment difficult. Patient also is not very optimistic That any these interventions will be beneficial. We discussed formal physical therapy which patient declined today. Patient will come back again in 4 weeks for further evaluation.

## 2013-09-13 NOTE — Progress Notes (Signed)
  [facility] [address] [place], [place] [address] Phone: [phone] Subjective:    I'm seeing this patient by the request  of:  Cathlean Cower, MD   CC: Bilateral shoulder pain [id number] Laura Barnes is a 73 y.o. female coming in with complaint of bilateral shoulder pain. Patient has had this pain for quite some time and does have a past medical history significant for depression, fibromyalgia, as well as multiple musculoskeletal complaints. Patient states that the pain seems to start in her neck and then radiate to both arms. Patient states that his pain seems to radiate down her arm and does give her some numbness or weakness. Patient states that even holding a newborn infant she can only do for approximately 10-15 minutes before she has significant weakness in the pain seems to be worse. Patient was to start Cymbalta but she read the side effects and did not want and wants to go back on citalopram.    Past medical history, social, surgical and family history all reviewed in electronic medical record.   Review of Systems: No headache, visual changes, nausea, vomiting, diarrhea, constipation, dizziness, abdominal pain, skin rash, fevers, chills, night sweats, weight loss, swollen lymph nodes, body aches, joint swelling, muscle aches, chest pain, shortness of breath, mood changes.   Objective Blood pressure 134/84, pulse 104, weight 146 lb (66.225 kg), SpO2 97.00%.  General: No apparent distress alert and oriented x3 mood and affect normal, dressed appropriately.  HEENT: Pupils equal, extraocular movements intact  Respiratory: Patient's speak in full sentences and does not appear short of breath  Cardiovascular: No lower extremity edema, non tender, no erythema  Skin: Warm dry intact with no signs of infection or rash on extremities or on axial skeleton.  Abdomen: Soft nontender  Neuro: Cranial nerves II through XII are intact, neurovascularly  intact in all extremities with 2+ DTRs and 2+ pulses.  Lymph: No lymphadenopathy of posterior or anterior cervical chain or axillae bilaterally.  Gait normal with good balance and coordination.  MSK:  Non tender with full range of motion and good stability and symmetric strength and tone of  elbows, wrist, hip, knee and ankles bilaterally.  Neck: Inspection unremarkable. No palpable stepoffs. Negative Spurling's maneuver. Full neck range of motion Grip strength and sensation normal in bilateral hands Strength good C4 to T1 distribution No sensory change to C4 to T1 Negative Hoffman sign bilaterally Reflexes normal Patient has multiple places of tenderness  Shoulder: Bilateral Inspection reveals no abnormalities, atrophy or asymmetry. Generalized tenderness to palpation of the shoulders bilaterally ROM is full in all planes. Rotator cuff strength normal throughout. No signs of impingement with negative Neer and Hawkin's tests, empty can sign. Patient states any range of motion causes pain no. Speeds and Yergason's tests normal. No labral pathology noted with negative Obrien's, negative clunk and good stability. Normal scapular function observed. No painful arc and no drop arm sign. No apprehension sign      Impression and Recommendations:     This case required medical decision making of moderate complexity.

## 2013-09-13 NOTE — Patient Instructions (Addendum)
Very nice to meet you You do have a lot of allergies which will make this a little complicated.  Exercises 3 times a week for your neck and for your shoulder.  Citalopram 10mg  twice daily.  Take tylenol 650 mg three times a day is the best evidence based medicine we have for arthritis.  Glucosamine sulfate 750mg  twice a day is a supplement that has been shown to help moderate to severe arthritis. Vitamin D 2000 IU daily Fish oil 2 grams daily.  Tumeric 500mg  twice daily.  Capsaicin topically up to four times a day may also help with pain. Cortisone injections are an option if these interventions do not seem to make a difference or need more relief. For shoulder of course.  If cortisone injections do not help, there are different types of shots that may help but they take longer to take effect.  We can discuss this at follow up.  It's important that you continue to stay active. Consider physical therapy to strengthen muscles around the joint that hurts to take pressure off of the joint itself. Shoe inserts with good arch support may be helpful.  [employer] orthotics at [employer] [facility] could help.  Water aerobics and cycling with low resistance are the best two types of exercise for arthritis. Ensure between breakfast and lunch and between lunch and dinner.  Come back and see me in 4 weeks.

## 2013-09-26 ENCOUNTER — Other Ambulatory Visit: Payer: Self-pay | Admitting: Internal Medicine

## 2013-09-27 ENCOUNTER — Other Ambulatory Visit: Payer: Self-pay | Admitting: *Deleted

## 2013-09-27 NOTE — Telephone Encounter (Signed)
Received fax pt needing PA on her cyclobenzaprine. Completed PA on cover-my-meds waiting on approval status...Laura Barnes

## 2013-09-27 NOTE — Telephone Encounter (Signed)
[facility].../lmb 

## 2013-09-27 NOTE — Telephone Encounter (Signed)
Done hardcopy to robin  

## 2013-09-28 NOTE — Telephone Encounter (Signed)
Received PA  Approval for cyclobenzaprine HCL for non-formulary exception through 05/31/14 under medicare Part D benefit..patient ID# [id number].  Patient and pharmacy both have been informed.

## 2013-09-30 ENCOUNTER — Other Ambulatory Visit: Payer: Self-pay | Admitting: Internal Medicine

## 2013-10-10 ENCOUNTER — Encounter: Payer: Self-pay | Admitting: Family Medicine

## 2013-10-10 ENCOUNTER — Ambulatory Visit (INDEPENDENT_AMBULATORY_CARE_PROVIDER_SITE_OTHER): Payer: Medicare Other | Admitting: Family Medicine

## 2013-10-10 ENCOUNTER — Ambulatory Visit: Payer: Medicare Other | Admitting: Internal Medicine

## 2013-10-10 ENCOUNTER — Encounter: Payer: Self-pay | Admitting: Internal Medicine

## 2013-10-10 ENCOUNTER — Ambulatory Visit (INDEPENDENT_AMBULATORY_CARE_PROVIDER_SITE_OTHER): Payer: Medicare Other | Admitting: Internal Medicine

## 2013-10-10 VITALS — BP 120/76 | HR 96 | Temp 98.1°F | Ht 60.0 in | Wt 148.4 lb

## 2013-10-10 VITALS — BP 116/72 | HR 92

## 2013-10-10 DIAGNOSIS — M25512 Pain in left shoulder: Principal | ICD-10-CM

## 2013-10-10 DIAGNOSIS — M25511 Pain in right shoulder: Secondary | ICD-10-CM

## 2013-10-10 DIAGNOSIS — M25519 Pain in unspecified shoulder: Secondary | ICD-10-CM

## 2013-10-10 DIAGNOSIS — E039 Hypothyroidism, unspecified: Secondary | ICD-10-CM

## 2013-10-10 DIAGNOSIS — M542 Cervicalgia: Secondary | ICD-10-CM

## 2013-10-10 DIAGNOSIS — Z0000 Encounter for general adult medical examination without abnormal findings: Secondary | ICD-10-CM

## 2013-10-10 DIAGNOSIS — M5030 Other cervical disc degeneration, unspecified cervical region: Secondary | ICD-10-CM

## 2013-10-10 DIAGNOSIS — F411 Generalized anxiety disorder: Secondary | ICD-10-CM

## 2013-10-10 DIAGNOSIS — E785 Hyperlipidemia, unspecified: Secondary | ICD-10-CM

## 2013-10-10 DIAGNOSIS — IMO0001 Reserved for inherently not codable concepts without codable children: Secondary | ICD-10-CM

## 2013-10-10 DIAGNOSIS — M797 Fibromyalgia: Secondary | ICD-10-CM

## 2013-10-10 MED ORDER — CYCLOBENZAPRINE HCL 5 MG PO TABS: 5.0000 mg | ORAL_TABLET | Freq: Three times a day (TID) | ORAL | Status: DC | PRN

## 2013-10-10 MED ORDER — ESTRADIOL 1 MG PO TABS: 1.0000 mg | ORAL_TABLET | Freq: Every day | ORAL | Status: DC

## 2013-10-10 MED ORDER — ALPRAZOLAM 0.5 MG PO TABS: ORAL_TABLET | ORAL | Status: DC

## 2013-10-10 NOTE — Patient Instructions (Addendum)
Good to see you Continue the medications Wear the shoes to help your back.  Physical therapy will be calling you Exercises 3 times a week.  Come back in 4 weeks.

## 2013-10-10 NOTE — Progress Notes (Signed)
Subjective:    Patient ID: Laura Barnes, female    DOB: [birth year], 73 y.o.   MRN: [id number]  HPI  Here to f/u; overall doing ok,  Pt denies chest pain, increased sob or doe, wheezing, orthopnea, PND, increased LE swelling, palpitations, dizziness or syncope.  Pt denies polydipsia, polyuria, or low sugar symptoms such as weakness or confusion improved with po intake.  Pt denies new neurological symptoms such as new headache, or facial or extremity weakness or numbness.   Pt states overall good compliance with meds, has been trying to follow lower cholesterol, diabetic diet, with wt overall stable,  but little exercise however.  Feels improved after seeing Dr Tamala Julian, still with typical overal FMS type pain however. Asks for refills estradiol, and flexeril, both somewhat of a problem getting paid for with her insurance.  Denies worsening depressive symptoms, suicidal ideation, or panic; has ongoing anxiety, not increased recently. Asks for routine refill xanax. Could not take the cymbalta due to cost.  Increased celexa to bid just started. Denies hyper or hypo thyroid symptoms such as voice, skin or hair change.  Past Medical History  Diagnosis Date  . HYPERLIPIDEMIA   . ANXIETY   . DEPRESSION   . MIGRAINE, COMMON   . MIGRAINE HEADACHE   . VENOUS INSUFFICIENCY, CHRONIC   . SINUSITIS- ACUTE-NOS   . ALLERGIC RHINITIS   . GERD   . IBS   . VAGINITIS   . SKIN LESION   . Enthesopathy of hip region   . FOOT PAIN, BILATERAL   . PLANTAR FASCIITIS   . OSTEOPOROSIS   . OSTEOPENIA   . RASH-NONVESICULAR   . NECK MASS   . Hiatal hernia   . Hypothyroidism   . Bronchitis   . Anemia   . Complication of anesthesia     says one time waking up she couldn't breathe, felt like her throat closing up  . Family history of anesthesia complication     sister with n/v   Past Surgical History  Procedure Laterality Date  . Appendectomy    . Cholecystectomy    . Mastectomy      bilateral for severe  bilateral fibrocystic disease  . Oophorectomy    . Shoulder surgery    . S/p neck lump removal    . Breast enhancement surgery    . Vaginal hysterectomy    . Tonsillectomy    . Colonoscopy    . Sinus endo w/fusion Right 06/30/2013    Procedure: RIGHT ENDOSCOPIC SPHENOIDECTOMY WITH FUSION SCAN;  Surgeon: [name], MD;  Location: [facility];  Service: ENT;  Laterality: Right;    reports that she has never smoked. She has never used smokeless tobacco. She reports that she does not drink alcohol or use illicit drugs. family history includes Breast cancer in her maternal grandmother; Diabetes in her mother, other, and sister; Heart disease in her father, maternal aunt, and maternal uncle; Kidney disease in her paternal uncle. There is no history of Colon cancer, Colon polyps, Rectal cancer, or Stomach cancer. Allergies  Allergen Reactions  . Prednisone Anaphylaxis  . Amoxicillin-Pot Clavulanate   . Aspirin     Stomach ache and bleed  . Doxycycline   . Erythromycin   . Hydrocodone Itching  . Hydrocodone-Acetaminophen Itching  . Latex     blisters  . Levofloxacin     Headaches, GI upset  . Methylphenidate Hcl   . Mirtazapine   . Nsaids     REACTION: stomach  bleeding per pt  . Rofecoxib   . Sertraline Hcl   . Statins Nausea Only    Headache, upset stomach, joint hurt, heartburn  . Sulfonamide Derivatives   . Sumatriptan    Current Outpatient Prescriptions on File Prior to Visit  Medication Sig Dispense Refill  . B Complex-Biotin-FA (B COMPLETE) TABS Take 1 tablet by mouth daily. B Complete 100mg       . buPROPion (WELLBUTRIN XL) 300 MG 24 hr tablet Take 1 tablet (300 mg total) by mouth daily.  90 tablet  3  . calcium carbonate (OS-CAL) 600 MG TABS tablet Take 600 mg by mouth 2 (two) times daily with a meal.       . cetirizine (ZYRTEC) 10 MG tablet Take 1 tablet (10 mg total) by mouth daily.  30 tablet  11  . cholecalciferol (VITAMIN D) 1000 UNITS tablet Take 1,000 Units by mouth  daily.      . citalopram (CELEXA) 10 MG tablet Take 1 tablet (10 mg total) by mouth 2 (two) times daily.  60 tablet  0  . clidinium-chlordiazePOXIDE (LIBRAX) 5-2.5 MG per capsule Take 1 capsule by mouth daily as needed (for stomach).      . Coenzyme Q10 (CO Q-10) 200 MG CAPS Take 1 capsule by mouth 2 (two) times daily.      Marland Kitchen esomeprazole (NEXIUM) 40 MG capsule Take 1 capsule (40 mg total) by mouth 2 (two) times daily before a meal.  60 capsule  11  . levothyroxine (SYNTHROID, LEVOTHROID) 25 MCG tablet Take 1 tablet (25 mcg total) by mouth daily before breakfast.  90 tablet  3  . Meperidine HCl (DEMEROL PO) Take by mouth. Takes for headaches unsure dose      . Multiple Vitamin (MULTIVITAMIN WITH MINERALS) TABS tablet Take 1 tablet by mouth daily.      . nitrofurantoin, macrocrystal-monohydrate, (MACROBID) 100 MG capsule Take 1 capsule (100 mg total) by mouth 2 (two) times daily.  20 capsule  0  . Omega-3 Fatty Acids (SALMON OIL-1000 PO) Take 1,000 mg by mouth daily.      . potassium chloride (KLOR-CON 10) 10 MEQ tablet Take 1 tablet (10 mEq total) by mouth daily.  90 tablet  3  . Red Yeast Rice Extract (RED YEAST RICE PO) Take 1 capsule by mouth 2 (two) times daily. Red Yeast Rice Complex      . topiramate (TOPAMAX) 50 MG tablet Take 50-250 mg by mouth 2 (two) times daily. Take 50mg  (1 tablet) daily in the morning.  Take 250mg  (5 tablets) daily at bedtime      . traMADol (ULTRAM) 50 MG tablet 1-2 tabs by mouth every 6 hrs as needed for pain  60 tablet  2  . triamcinolone cream (KENALOG) 0.5 % Apply topically 2 (two) times daily. As needed for rash  30 g  0  . triamterene-hydrochlorothiazide (MAXZIDE-25) 37.5-25 MG per tablet Take 0.5 tablets by mouth daily.        No current facility-administered medications on file prior to visit.      Review of Systems  Constitutional: Negative for unusual diaphoresis or other sweats  HENT: Negative for ringing in ear Eyes: Negative for double vision or  worsening visual disturbance.  Respiratory: Negative for choking and stridor.   Gastrointestinal: Negative for vomiting or other signifcant bowel change Genitourinary: Negative for hematuria or decreased urine volume.  Musculoskeletal: Negative for other MSK pain or swelling Skin: Negative for color change and worsening wound.  Neurological: Negative  for tremors and numbness other than noted  Psychiatric/Behavioral: Negative for decreased concentration or agitation other than above       Objective:   Physical Exam BP 120/76  Pulse 96  Temp(Src) 98.1 F (36.7 C) (Oral)  Ht 5' (1.524 m)  Wt 148 lb 6 oz (67.302 kg)  BMI 28.98 kg/m2  SpO2 97% VS noted,  Constitutional: Pt appears well-developed, well-nourished.  HENT: Head: NCAT.  Right Ear: External ear normal.  Left Ear: External ear normal.  Eyes: . Pupils are equal, round, and reactive to light. Conjunctivae and EOM are normal Neck: Normal range of motion. Neck supple.  Cardiovascular: Normal rate and regular rhythm.   Pulmonary/Chest: Effort normal and breath sounds normal.  Abd:  Soft, NT, ND, + BS Neurological: Pt is alert. Not confused , motor grossly intact Skin: Skin is warm. No rash Diffuse tender to back Psychiatric: Pt behavior is normal. No agitation. mild nervous    Assessment & Plan:

## 2013-10-10 NOTE — Progress Notes (Signed)
  [name] [address], [place] [address] Phone: [phone] Subjective:    I'm seeing this patient by the request  of:  Cathlean Cower, MD   CC: Bilateral shoulder pain and neck pain.   [id number] Laura Barnes is a 73 y.o. female coming in for followup of her neck pain and bilateral shoulder pain. Patient does have a past medical history of fibromyalgia and has not been taking her slacks on a regular basis. Patient was given exercises and states that she has not been doing them regularly. We discussed over-the-counter medications and she is taking vitamin D regularly she states. Patient states that she has not made any significant improvement. States that she still has the total chronic pain that can be sharp with certain movements. Patient did get x-rays of her neck shows that she does have degenerative disc disease at multiple levels. No new symptoms noted.    Past medical history, social, surgical and family history all reviewed in electronic medical record.   Review of Systems: No headache, visual changes, nausea, vomiting, diarrhea, constipation, dizziness, abdominal pain, skin rash, fevers, chills, night sweats, weight loss, swollen lymph nodes, body aches, joint swelling, muscle aches, chest pain, shortness of breath, mood changes.   Objective Blood pressure 116/72, pulse 92, SpO2 97.00%.  General: No apparent distress alert and oriented x3 mood and affect normal, dressed appropriately.  HEENT: Pupils equal, extraocular movements intact  Respiratory: Patient's speak in full sentences and does not appear short of breath  Cardiovascular: No lower extremity edema, non tender, no erythema  Skin: Warm dry intact with no signs of infection or rash on extremities or on axial skeleton.  Abdomen: Soft nontender  Neuro: Cranial nerves II through XII are intact, neurovascularly intact in all extremities with 2+ DTRs and 2+ pulses.  Lymph: No  lymphadenopathy of posterior or anterior cervical chain or axillae bilaterally.  Gait normal with good balance and coordination.  MSK:  Non tender with full range of motion and good stability and symmetric strength and tone of shoulders, elbows, wrist, hip, knee and ankles bilaterally.  Neck: Inspection unremarkable. No palpable stepoffs. Negative Spurling's maneuver. Full neck range of motion Grip strength and sensation normal in bilateral hands Strength good C4 to T1 distribution No sensory change to C4 to T1 Negative Hoffman sign bilaterally Reflexes normal Patient has multiple places of tenderness along the medial aspect of the scapula bilaterally    Impression and Recommendations:     This case required medical decision making of moderate complexity.

## 2013-10-10 NOTE — Assessment & Plan Note (Signed)
Unable to afford the cymbalta, cont tx as is for now

## 2013-10-10 NOTE — Patient Instructions (Signed)
OK to continue the celexa at twice per day  Please continue all other medications as before, and refills have been done if requested. Please have the pharmacy call with any other refills you may need.  Please continue your efforts at being more active, low cholesterol diet, and weight control.  Please keep your appointments with your specialists as you may have planned  Please return in 6 months, or sooner if needed, with Lab testing done 3-5 days before

## 2013-10-10 NOTE — Assessment & Plan Note (Signed)
stable overall by history and exam, recent data reviewed with pt, and pt to continue medical treatment as before,  to f/u any worsening symptoms or concerns Lab Results  Component Value Date   TSH 2.91 04/06/2013

## 2013-10-10 NOTE — Assessment & Plan Note (Signed)
Lab Results  Component Value Date   LDLCALC 82 04/07/2011   stable overall by history and exam, recent data reviewed with pt, and pt to continue medical treatment as before,  to f/u any worsening symptoms or concerns

## 2013-10-10 NOTE — Assessment & Plan Note (Signed)
Overall stable to some improved on bid celexa,  to f/u any worsening symptoms or concerns

## 2013-10-10 NOTE — Assessment & Plan Note (Signed)
Patient's neck pain is multifactorial with fibromyalgia as well as underlying degenerative disc disease. Patient's multiple medication allergies it would be difficult to treat and patient also does not take medications as prescribed. Patient encouraged to do formal physical therapy. Patient states to to financial restraints she'll not be able to go on her regular basis. We discussed doing one time to learn home exercises 2. Patient will do this and we discussed over-the-counter medications again. Patient which showed proper technique of multiple home exercises and will try to do more of a regular basis. Patient and will come back in 4 weeks for further evaluation. She continues to have pain we may need to consider further imaging of the neck but I think likely she will not responded extremely well to epidural steroid injections as well. Patient is going to see another provider for another opinion.  Spent greater than 25 minutes with patient face-to-face and had greater than 50% of counseling including as described above in assessment and plan.

## 2013-10-10 NOTE — Progress Notes (Signed)
Pre visit review using our clinic review tool, if applicable. No additional management support is needed unless otherwise documented below in the visit note. 

## 2013-10-16 ENCOUNTER — Other Ambulatory Visit: Payer: Self-pay | Admitting: Internal Medicine

## 2013-10-24 ENCOUNTER — Ambulatory Visit: Payer: Medicare Other | Admitting: Physical Therapy

## 2013-11-02 ENCOUNTER — Other Ambulatory Visit: Payer: Self-pay | Admitting: Internal Medicine

## 2013-11-06 ENCOUNTER — Ambulatory Visit: Payer: Medicare Other | Attending: Family Medicine | Admitting: Physical Therapy

## 2013-11-06 DIAGNOSIS — M542 Cervicalgia: Secondary | ICD-10-CM | POA: Insufficient documentation

## 2013-11-06 DIAGNOSIS — IMO0001 Reserved for inherently not codable concepts without codable children: Secondary | ICD-10-CM | POA: Insufficient documentation

## 2013-11-06 DIAGNOSIS — M25519 Pain in unspecified shoulder: Secondary | ICD-10-CM | POA: Insufficient documentation

## 2013-11-07 ENCOUNTER — Ambulatory Visit (INDEPENDENT_AMBULATORY_CARE_PROVIDER_SITE_OTHER): Payer: Medicare Other | Admitting: Family Medicine

## 2013-11-07 ENCOUNTER — Other Ambulatory Visit (INDEPENDENT_AMBULATORY_CARE_PROVIDER_SITE_OTHER): Payer: Medicare Other

## 2013-11-07 ENCOUNTER — Encounter: Payer: Self-pay | Admitting: Family Medicine

## 2013-11-07 VITALS — BP 122/82 | HR 88 | Ht 60.0 in | Wt 145.0 lb

## 2013-11-07 DIAGNOSIS — M25512 Pain in left shoulder: Principal | ICD-10-CM

## 2013-11-07 DIAGNOSIS — M25519 Pain in unspecified shoulder: Secondary | ICD-10-CM

## 2013-11-07 DIAGNOSIS — M25511 Pain in right shoulder: Secondary | ICD-10-CM

## 2013-11-07 NOTE — Progress Notes (Signed)
[name] [address], [place] [address] Phone: [phone] Subjective:     CC: Bilateral shoulder pain and neck pain.   [id number] Laura Barnes is a 73 y.o. female coming in for followup of her neck pain and bilateral shoulder pain. 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 Patient's left shoulder has gotten acutely worse. Patient states she was attempting to fill upper coffeepot and when she internally rotated her arm she had severe pain the major drop the coffeepot. Patient states since that time which was greater than 48 hours ago she does have a dull aching pain in the shoulder. Denies any weakness more than usual. Patient has been attempting to take her tramadol without any significant improvement. Patient continues a medicine for her fibromyalgia. Denies any worsening of her neck pain.    Past medical history, social, surgical and family history all reviewed in electronic medical record.   Review of Systems: No headache, visual changes, nausea, vomiting, diarrhea, constipation, dizziness, abdominal pain, skin rash, fevers, chills, night sweats, weight loss, swollen lymph nodes, body aches, joint swelling, muscle aches, chest pain, shortness of breath, mood changes.   Objective Blood pressure 122/82, pulse 88, height 5' (1.524 m), weight 145 lb (65.772 kg), SpO2 96.00%.  General: No apparent distress alert and oriented x3 mood and affect normal, dressed appropriately.  HEENT: Pupils equal, extraocular movements intact  Respiratory: Patient's speak in full sentences and does not appear short of breath  Cardiovascular: No lower extremity edema, non tender, no erythema  Skin: Warm dry intact with no signs of infection or rash on extremities or on axial skeleton.  Abdomen: Soft nontender  Neuro: Cranial nerves II through XII are intact,  neurovascularly intact in all extremities with 2+ DTRs and 2+ pulses.  Lymph: No lymphadenopathy of posterior or anterior cervical chain or axillae bilaterally.  Gait normal with good balance and coordination.  MSK:  Non tender with full range of motion and good stability and symmetric strength and tone of shoulders, elbows, wrist, hip, knee and ankles bilaterally.  Neck: Inspection unremarkable. No palpable stepoffs. Negative Spurling's maneuver. Full neck range of motion Grip strength and sensation normal in bilateral hands Strength good C4 to T1 distribution No sensory change to C4 to T1 Negative Hoffman sign bilaterally Reflexes normal Patient has multiple places of tenderness along the medial aspect of the scapula bilaterally Shoulder: Left Inspection reveals no abnormalities, atrophy or asymmetry. Palpation is normal with no tenderness over AC joint or bicipital groove. ROM is full in all planes passively The patient was not giving good effort and strength testing bilaterally. signs of impingement with positive Neer and Hawkin's tests, empty can sign. Speeds and Yergason's tests normal. No labral pathology noted with negative Obrien's, negative clunk and good stability. Normal scapular function observed. No painful arc and no drop arm sign. No apprehension sign  Procedure: Real-time Ultrasound Guided Injection of left glenohumeral joint Device: GE Logiq E  Ultrasound guided injection is preferred based studies that show increased duration, increased effect, greater accuracy, decreased procedural pain, increased response rate with ultrasound guided versus blind injection.  Verbal informed consent obtained.  Time-out conducted.  Noted no overlying erythema, induration, or other signs of local infection.  Skin prepped in a sterile fashion.  Local anesthesia: Topical Ethyl chloride.  With sterile technique and under real time ultrasound guidance:  Joint visualized.  23g 1  inch  needle inserted posterior approach. Pictures taken for needle placement. Patient did have injection of 2 cc of 1% lidocaine, 2 cc of 0.5% Marcaine, and 1cc of Kenalog 40 mg/dL. Completed without difficulty  Pain immediately resolved suggesting accurate placement of the medication.  Advised to call if fevers/chills, erythema, induration, drainage, or persistent bleeding.  Images permanently stored and available for review in the ultrasound unit.  Impression: Technically successful ultrasound guided injection.       Impression and Recommendations:     This case required medical decision making of moderate complexity.

## 2013-11-07 NOTE — Assessment & Plan Note (Signed)
Patient did have an injection into her left shoulder. Patient was having a significant amount of pain I do think that she did have some mild bursitis but no true tear appreciated of the rotator cuff. On exam patient's strength testing was unable to be evaluated secondary to patient not trying. The discussed the importance home exercises and was given a handout today. Patient will continue her other medications as well. Patient will follow up in 3-4 weeks for further evaluation. Patient continues to have trouble I still think cervical radiculopathy could be a possibility the patient may need further imaging including MRI.  Spent greater than 25 minutes with patient face-to-face and had greater than 50% of counseling including as described above in assessment and plan.

## 2013-11-07 NOTE — Patient Instructions (Signed)
Good to se eyou Try the exercises 3 times a week If the pain is better we can do the other side If not a lot better we may consider further imaging on your neck Continue all the medications we have discussed  Come back in 3-4 weeks.

## 2013-12-04 ENCOUNTER — Encounter: Payer: Self-pay | Admitting: Family Medicine

## 2013-12-04 ENCOUNTER — Ambulatory Visit (INDEPENDENT_AMBULATORY_CARE_PROVIDER_SITE_OTHER): Payer: Medicare Other | Admitting: Family Medicine

## 2013-12-04 VITALS — BP 130/82 | HR 100 | Ht 60.0 in | Wt 144.0 lb

## 2013-12-04 DIAGNOSIS — M25512 Pain in left shoulder: Principal | ICD-10-CM

## 2013-12-04 DIAGNOSIS — M25511 Pain in right shoulder: Secondary | ICD-10-CM

## 2013-12-04 DIAGNOSIS — R29898 Other symptoms and signs involving the musculoskeletal system: Secondary | ICD-10-CM

## 2013-12-04 DIAGNOSIS — M259 Joint disorder, unspecified: Secondary | ICD-10-CM

## 2013-12-04 DIAGNOSIS — M25519 Pain in unspecified shoulder: Secondary | ICD-10-CM

## 2013-12-04 DIAGNOSIS — M5030 Other cervical disc degeneration, unspecified cervical region: Secondary | ICD-10-CM

## 2013-12-04 NOTE — Patient Instructions (Signed)
It is good to see you.  Ice is your best friend.  Exercises 3 times a week really forever.   Consider turmeric 500mg  twice daily always.  MRI is ordered and I will discuss findings with you at next follow up.  Once you have the MRi and I will call you and discuss the results. If bad enough we will consider epidural injection.

## 2013-12-04 NOTE — Assessment & Plan Note (Signed)
Patient's is difficult to find out what is truly the primary problem here. Wondering if patient's pain is more secondary to her fibromyalgia or if it is truly secondary to his osteoarthritis of the neck and is having some nerve root impingement. Patient does have significant number of allergies foot is very difficult to do any type of medications. Patient did have improvement with an epidural steroid injection in her lower back previously. Patient's will continue the home exercises but I would like to get further imaging of her cervical neck to rule out any causes of radicular symptoms to her shoulders. Patient has been referred for the MRI. We discussed formal physical therapy again which patient declined secondary to financial constraints. No medications were given at this time but if she needs a refill I do more than willing. Once patient has MRI we'll discuss further about next treatment option such as a possible epidural Spent greater than 25 minutes with patient face-to-face and had greater than 50% of counseling including as described above in assessment and plan.

## 2013-12-04 NOTE — Progress Notes (Signed)
  [name] [address], [place] [address] Phone: [phone] Subjective:     CC: Bilateral shoulder pain and neck pain.   [id number] Laura Barnes is a 73 y.o. female coming in for followup of her neck pain and bilateral shoulder pain. 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   Patient at last visit was having severe pain in her left shoulder and seems to be more localized. Patient did have an intra-articular injection under ultrasound guidance at that time. Patient was given home exercises and encouraged to do them on a regular basis for both her neck as well as shoulders. Patient states she may have had some mild improvement but overall did not have any significant decrease in pain. Patient continues to have bilateral pain in her shoulders as well as neck. Patient is also had what she says is worse in an exacerbation of her fibromyalgia recently. Patient is accompanied in pain all the time and is wondered what would be the next step.Marland Kitchen    Past medical history, social, surgical and family history all reviewed in electronic medical record.   Review of Systems: No headache, visual changes, nausea, vomiting, diarrhea, constipation, dizziness, abdominal pain, skin rash, fevers, chills, night sweats, weight loss, swollen lymph nodes, body aches, joint swelling, muscle aches, chest pain, shortness of breath, mood changes.   Objective Blood pressure 130/82, pulse 100, height 5' (1.524 m), weight 144 lb (65.318 kg), SpO2 97.00%.  General: No apparent distress alert and oriented x3 mood and affect normal, dressed appropriately.  HEENT: Pupils equal, extraocular movements intact  Respiratory: Patient's speak in full sentences and does not appear short of breath  Cardiovascular: No lower extremity edema, non tender, no erythema  Skin: Warm dry intact with no signs  of infection or rash on extremities or on axial skeleton.  Abdomen: Soft nontender  Neuro: Cranial nerves II through XII are intact, neurovascularly intact in all extremities with 2+ DTRs and 2+ pulses.  Lymph: No lymphadenopathy of posterior or anterior cervical chain or axillae bilaterally.  Gait normal with good balance and coordination.  MSK:  Non tender with full range of motion and good stability and symmetric strength and tone of shoulders, elbows, wrist, hip, knee and ankles bilaterally.  Neck: Inspection unremarkable. No palpable stepoffs. Negative Spurling's maneuver. Full neck range of motion Grip strength and sensation normal in bilateral hands Strength good C4 to T1 distribution No sensory change to C4 to T1 Negative Hoffman sign bilaterally Reflexes normal Patient has multiple places of tenderness along the medial aspect of the scapula bilaterally Shoulder: Left Inspection reveals no abnormalities, atrophy or asymmetry. Palpation is normal with no tenderness over AC joint or bicipital groove. ROM is full in all planes passively The patient was not giving good effort and strength testing bilaterally. signs of impingement with positive Neer and Hawkin's tests, empty can sign. Speeds and Yergason's tests normal. No labral pathology noted with negative Obrien's, negative clunk and good stability. Normal scapular function observed. No painful arc and no drop arm sign. No apprehension sign     Impression and Recommendations:     This case required medical decision making of moderate complexity.

## 2013-12-05 ENCOUNTER — Ambulatory Visit: Payer: Medicare Other | Attending: Family Medicine | Admitting: Physical Therapy

## 2013-12-05 DIAGNOSIS — IMO0001 Reserved for inherently not codable concepts without codable children: Secondary | ICD-10-CM | POA: Insufficient documentation

## 2013-12-05 DIAGNOSIS — M542 Cervicalgia: Secondary | ICD-10-CM | POA: Diagnosis not present

## 2013-12-05 DIAGNOSIS — M25519 Pain in unspecified shoulder: Secondary | ICD-10-CM | POA: Diagnosis not present

## 2013-12-18 ENCOUNTER — Ambulatory Visit
Admission: RE | Admit: 2013-12-18 | Discharge: 2013-12-18 | Disposition: A | Discharge status: Home / Self Care | Payer: Medicare Other | Source: Ambulatory Visit | Attending: Family Medicine | Admitting: Family Medicine

## 2013-12-18 DIAGNOSIS — M25512 Pain in left shoulder: Principal | ICD-10-CM

## 2013-12-18 DIAGNOSIS — M25511 Pain in right shoulder: Secondary | ICD-10-CM

## 2013-12-18 DIAGNOSIS — R29898 Other symptoms and signs involving the musculoskeletal system: Secondary | ICD-10-CM

## 2013-12-19 ENCOUNTER — Ambulatory Visit: Payer: Medicare Other | Admitting: Physical Therapy

## 2013-12-20 ENCOUNTER — Telehealth: Payer: Self-pay | Admitting: *Deleted

## 2013-12-20 MED ORDER — CYCLOBENZAPRINE HCL 5 MG PO TABS: 5.0000 mg | ORAL_TABLET | Freq: Three times a day (TID) | ORAL | Status: DC | PRN

## 2013-12-20 NOTE — Telephone Encounter (Signed)
Message copied by Alphonsus Sias on Wed Dec 20, 2013  4:08 PM ------      Message from: Laura Barnes      Created: Wed Dec 20, 2013 12:27 PM       Called patient and told her results. Will do epidural injection. Order will be placed      Continue the flexeril. Patient needs refill for 90 day supply. ------

## 2013-12-20 NOTE — Telephone Encounter (Signed)
Refill done.  

## 2013-12-29 ENCOUNTER — Other Ambulatory Visit: Payer: Self-pay | Admitting: Internal Medicine

## 2013-12-29 NOTE — Telephone Encounter (Signed)
[facility].../lmb 

## 2013-12-29 NOTE — Telephone Encounter (Signed)
One refill only °

## 2013-12-29 NOTE — Telephone Encounter (Signed)
md out of office. pls advise...lmb 

## 2014-01-10 ENCOUNTER — Ambulatory Visit: Payer: Medicare Other | Attending: Family Medicine | Admitting: Physical Therapy

## 2014-01-10 DIAGNOSIS — IMO0001 Reserved for inherently not codable concepts without codable children: Secondary | ICD-10-CM | POA: Insufficient documentation

## 2014-01-10 DIAGNOSIS — M25519 Pain in unspecified shoulder: Secondary | ICD-10-CM | POA: Insufficient documentation

## 2014-01-10 DIAGNOSIS — M542 Cervicalgia: Secondary | ICD-10-CM | POA: Insufficient documentation

## 2014-01-24 ENCOUNTER — Telehealth: Payer: Self-pay | Admitting: Internal Medicine

## 2014-01-24 MED ORDER — ALPRAZOLAM 0.5 MG PO TABS: ORAL_TABLET | ORAL | Status: DC

## 2014-01-24 NOTE — Telephone Encounter (Signed)
Done hardcopy to robin  

## 2014-01-24 NOTE — Telephone Encounter (Signed)
Patient is having trouble with [facility] so she is switching to [facility] [address] [facility].  She would like xanax sent over.

## 2014-01-24 NOTE — Telephone Encounter (Signed)
Faxed hardcopy to [facility].  And called the patient to inform.  Did remove [facility] From her pharmacy list as no longer using them.

## 2014-01-25 ENCOUNTER — Other Ambulatory Visit: Payer: Self-pay | Admitting: Family Medicine

## 2014-01-25 NOTE — Telephone Encounter (Signed)
Refill done.  

## 2014-02-06 ENCOUNTER — Other Ambulatory Visit: Payer: Self-pay

## 2014-02-06 MED ORDER — TRIAMTERENE-HCTZ 37.5-25 MG PO TABS: 0.5000 | ORAL_TABLET | Freq: Every day | ORAL | Status: DC

## 2014-02-06 MED ORDER — TRAMADOL HCL 50 MG PO TABS: ORAL_TABLET | ORAL | Status: DC

## 2014-02-06 NOTE — Telephone Encounter (Signed)
Done hardcopy to robin  

## 2014-02-07 NOTE — Telephone Encounter (Signed)
Faxed hardcopy for Tramadol [facility]

## 2014-02-25 ENCOUNTER — Other Ambulatory Visit: Payer: Self-pay | Admitting: Family Medicine

## 2014-02-26 NOTE — Telephone Encounter (Signed)
Refill done.  

## 2014-03-09 ENCOUNTER — Other Ambulatory Visit: Payer: Self-pay

## 2014-03-09 MED ORDER — LEVOTHYROXINE SODIUM 25 MCG PO TABS: 25.0000 ug | ORAL_TABLET | Freq: Every day | ORAL | Status: DC

## 2014-03-19 ENCOUNTER — Other Ambulatory Visit: Payer: Self-pay | Admitting: Family Medicine

## 2014-03-19 NOTE — Telephone Encounter (Signed)
Refill done.  

## 2014-03-21 ENCOUNTER — Telehealth: Payer: Self-pay | Admitting: Family Medicine

## 2014-03-21 MED ORDER — CITALOPRAM HYDROBROMIDE 10 MG PO TABS: 10.0000 mg | ORAL_TABLET | Freq: Two times a day (BID) | ORAL | Status: DC

## 2014-03-21 NOTE — Telephone Encounter (Signed)
Inform pt med has been sent to [facility]...Laura Barnes

## 2014-03-21 NOTE — Telephone Encounter (Signed)
Pt out of citalopram, requesting refill to [facility]

## 2014-03-27 ENCOUNTER — Ambulatory Visit (INDEPENDENT_AMBULATORY_CARE_PROVIDER_SITE_OTHER)
Admission: RE | Admit: 2014-03-27 | Discharge: 2014-03-27 | Disposition: A | Discharge status: Home / Self Care | Payer: Medicare Other | Source: Ambulatory Visit | Attending: Family Medicine | Admitting: Family Medicine

## 2014-03-27 ENCOUNTER — Ambulatory Visit (INDEPENDENT_AMBULATORY_CARE_PROVIDER_SITE_OTHER): Payer: Medicare Other | Admitting: Family Medicine

## 2014-03-27 ENCOUNTER — Encounter: Payer: Self-pay | Admitting: Family Medicine

## 2014-03-27 VITALS — BP 126/80 | HR 105 | Ht 60.0 in | Wt 144.0 lb

## 2014-03-27 DIAGNOSIS — M5416 Radiculopathy, lumbar region: Secondary | ICD-10-CM

## 2014-03-27 DIAGNOSIS — M5030 Other cervical disc degeneration, unspecified cervical region: Secondary | ICD-10-CM

## 2014-03-27 MED ORDER — BACLOFEN 10 MG PO TABS: 10.0000 mg | ORAL_TABLET | Freq: Three times a day (TID) | ORAL | Status: DC

## 2014-03-27 NOTE — Progress Notes (Signed)
[name] [address], [place] [address] Phone: [phone] Subjective:     CC: Bilateral shoulder pain and neck pain.   GLO:Laura Barnes Laura Barnes is a 73 y.o. female coming in for followup of her neck pain and bilateral shoulder pain. 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 Patient is also seen by a pain management doctor. Patient has not been seen for greater than 3 months. Patient had an MRI after her last evaluation of her cervical spine. Patient's MRI 12/18/2013. 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 Continues to have left shoulder pain as well. Describes the pain is more of a dull aching sensation that is stopping her from regular daily activities.  Patient also is complaining of low back pain and is radiating into both legs. Patient has been seen by another provider previously and had an epidural greater than 4 years ago. Patient states that this helped the pain significantly at that time. Now patient has a dull aching sensation even with sitting. States that it feels like her legs are heavy. Occurs all the time. Not responding to her chronic narcotics. Patient has multiple allergies so does not try many other over-the-counter medications. Does seem to help with some of the muscle relaxers. Patient denies any fevers or chills or any abnormal weight loss. ..    Past medical history, social, surgical and family history all reviewed in electronic medical record.   Review of Systems: No headache, visual changes, nausea, vomiting, diarrhea, constipation, dizziness, abdominal pain, skin rash, fevers, chills, night sweats, weight loss, swollen lymph nodes, body aches, joint  swelling, muscle aches, chest pain, shortness of breath, mood changes.   Objective Blood pressure 126/80, pulse 105, height 5' (1.524 m), weight 144 lb (65.318 kg), SpO2 98.00%.  General: No apparent distress alert and oriented x3 mood and affect normal, dressed appropriately. Patient does have racing speech HEENT: Pupils equal, extraocular movements intact  Respiratory: Patient's speak in full sentences and does not appear short of breath  Cardiovascular: No lower extremity edema, non tender, no erythema  Skin: Warm dry intact with no signs of infection or rash on extremities or on axial skeleton.  Abdomen: Soft nontender  Neuro: Cranial nerves II through XII are intact, neurovascularly intact in all extremities with 2+ DTRs and 2+ pulses.  Lymph: No lymphadenopathy of posterior or anterior cervical chain or axillae bilaterally.  Gait normal with good balance and coordination.  MSK:  Non tender with full range of motion and good stability and symmetric strength and tone of shoulders, elbows, wrist, hip, knee and ankles bilaterally.  Neck: Inspection unremarkable. No palpable stepoffs. Positive Spurling's maneuver. Full neck range of motion Grip strength and sensation normal in bilateral hands Strength good C4 to T1 distribution, somewhat difficult to assess because patient is not being compliant No sensory change to C4 to T1 Negative Hoffman sign bilaterally Reflexes normal Patient has multiple places of tenderness along the medial aspect of the scapula bilaterally Back Exam:  Inspection: Unremarkable  Motion: Flexion 35 deg, Extension 35 deg, Side Bending to 45 deg bilaterally,  Rotation to 45 deg bilaterally  SLR laying: Negative  XSLR laying: Negative  Palpable tenderness: Diffuse nonspecific tenderness FABER: Positive bilaterally Sensory change: Gross sensation intact to  all lumbar and sacral dermatomes.  Reflexes: 2+ at both patellar tendons, 2+ at achilles tendons, Babinski's  downgoing.  Strength at foot  Plantar-flexion: 5/5 Dorsi-flexion: 4/5 Eversion: 4/5 Inversion: 5/5  Leg strength  Quad: 5/5 Hamstring: 5/5 Hip flexor: 5/5 Hip abductors: 5/5  Gait unremarkable.      Impression and Recommendations:     This case required medical decision making of moderate complexity.

## 2014-03-27 NOTE — Patient Instructions (Addendum)
It is good to see you We will get an epidural in your neck to see if we can get you better.  Try the baclofen for the muscle spasm.  Can take up to 3 times daily if needed.  Any activity would be good.  We will get an xray of lower back today.  Exercises 3 times a week.  Ice is your friend Come back 2 weeks after injection.

## 2014-03-27 NOTE — Discharge Instructions (Signed)
Back Exercises These exercises may help you when beginning to rehabilitate yo Back Pain, Adult Back pain is very common. The pain often gets better over time. The cause of back pain is usually not dangerous. Most people can learn to manage their back pain on their own.  HOME CARE   Stay active. Start with short walks on flat ground if you can. Try to walk farther each day.  Do not sit, drive, or stand in one place for more than 30 minutes. Do not stay in bed.  Do not avoid exercise or work. Activity can help your back heal faster.  Be careful when you bend or lift an object. Bend at your knees, keep the object close to you, and do not twist.  Sleep on a firm mattress. Lie on your side, and bend your knees. If you lie on your back, put a pillow under your knees.  Only take medicines as told by your doctor.  Put ice on the injured area.  Put ice in a plastic bag.  Place a towel between your skin and the bag.  Leave the ice on for 15-20 minutes, 03-04 times a day for the first 2 to 3 days. After that, you can switch between ice and heat packs.  Ask your doctor about back exercises or massage.  Avoid feeling anxious or stressed. Find good ways to deal with stress, such as exercise. GET HELP RIGHT AWAY IF:   Your pain does not go away with rest or medicine.  Your pain does not go away in 1 week.  You have new problems.  You do not feel well.  The pain spreads into your legs.  You cannot control when you poop (bowel movement) or pee (urinate).  Your arms or legs feel weak or lose feeling (numbness).  You feel sick to your stomach (nauseous) or throw up (vomit).  You have belly (abdominal) pain.  You feel like you may pass out (faint). MAKE SURE YOU:   Understand these instructions.  Will watch your condition.  Will get help right away if you are not doing well or get worse. Document Released: 11/04/2007 Document Revised: 08/10/2011 Document Reviewed:  09/19/2013 [facility] Patient Information 2015 [place], [place]. This information is not intended to replace advice given to you by your health care provider. Make sure you discuss any questions you have with your health care provider. ur injury. Your symptoms may resolve with or without further involvement from your physician, physical therapist or athletic trainer. While completing these exercises, remember:   Restoring tissue flexibility helps normal m Back Exercises Back exercises help treat and prevent back injuries. The goal is to increase your strength in your belly (abdominal) and back muscles. These exercises can also help with flexibility. Start these exercises when told by your doctor. HOME CARE Back exercises include: Pelvic Tilt. Lie on your back with your knees bent. Tilt your pelvis until the lower part of your back is against the floor. Hold this position 5 to 10 sec. Repeat this exercise 5 to 10 times. Knee to Chest. Pull 1 knee up against your chest and hold for 20 to 30 seconds. Repeat this with the other knee. This may be done with the other leg straight or bent, whichever feels better. Then, pull both knees up against your chest. Sit-Ups or Curl-Ups. Bend your knees 90 degrees. Start with tilting your pelvis, and do a partial, slow sit-up. Only lift your upper half 30 to 45 degrees off the floor.  Take at least 2 to 3 seonds for each sit-up. Do not do sit-ups with your knees out straight. If partial sit-ups are difficult, simply do the above but with only tightening your belly (abdominal) muscles and holding it as told. Hip-Lift. Lie on your back with your knees flexed 90 degrees. Push down with your feet and shoulders as you raise your hips 2 inches off the floor. Hold for 10 seconds, repeat 5 to 10 times. Back Arches. Lie on your stomach. Prop yourself up on bent elbows. Slowly press on your hands, causing an arch in your low back. Repeat 3 to 5 times. Shoulder-Lifts. Lie face  down with arms beside your body. Keep hips and belly pressed to floor as you slowly lift your head and shoulders off the floor. Do not overdo your exercises. Be careful in the beginning. Exercises may cause you some mild back discomfort. If the pain lasts for more than 15 minutes, stop the exercises until you see your doctor. Improvement with exercise for back problems is slow.  Document Released: 06/20/2010 Document Revised: 08/10/2011 Document Reviewed: 03/19/2011 [facility] Patient Information 2015 [place], [place]. This information is not intended to replace advice given to you by your health care provider. Make sure you discuss any questions you have with your health care provider.   Back Exercises Back exercises help treat and prevent back injuries. The goal is to increase your strength in your belly (abdominal) and back muscles. These exercises can also help with flexibility. Start these exercises when told by your doctor. HOME CARE Back exercises include: Pelvic Tilt. Lie on your back with your knees bent. Tilt your pelvis until the lower part of your back is against the floor. Hold this position 5 to 10 sec. Repeat this exercise 5 to 10 times. Knee to Chest. Pull 1 knee up against your chest and hold for 20 to 30 seconds. Repeat this with the other knee. This may be done with the other leg straight or bent, whichever feels better. Then, pull both knees up against your chest. Sit-Ups or Curl-Ups. Bend your knees 90 degrees. Start with tilting your pelvis, and do a partial, slow sit-up. Only lift your upper half 30 to 45 degrees off the floor. Take at least 2 to 3 seonds for each sit-up. Do not do sit-ups with your knees out straight. If partial sit-ups are difficult, simply do the above but with only tightening your belly (abdominal) muscles and holding it as told. Hip-Lift. Lie on your back with your knees flexed 90 degrees. Push down with your feet and shoulders as you raise your hips 2  inches off the floor. Hold for 10 seconds, repeat 5 to 10 times. Back Arches. Lie on your stomach. Prop yourself up on bent elbows. Slowly press on your hands, causing an arch in your low back. Repeat 3 to 5 times. Shoulder-Lifts. Lie face down with arms beside your body. Keep hips and belly pressed to floor as you slowly lift your head and shoulders off the floor. Do not overdo your exercises. Be careful in the beginning. Exercises may cause you some mild back discomfort. If the pain lasts for more than 15 minutes, stop the exercises until you see your doctor. Improvement with exercise for back problems is slow.  Document Released: 06/20/2010 Document Revised: 08/10/2011 Document Reviewed: 03/19/2011 [facility] Patient Information 2015 [place], [place]. This information is not intended to replace advice given to you by your health care provider. Make sure you discuss any questions you  have with your health care provider.  otion to return to the joints. This allows healthier, less painful movement and activity.  An effective stretch should be held for at least 30 seconds.  A stretch should never be painful. You should only feel a gentle lengthening or release in the stretched tissue. STRETCH - Extension, Prone on Elbows   Lie on your stomach on the floor, a bed will be too soft. Place your palms about shoulder width apart and at the height of your head.  Place your elbows under your shoulders. If this is too painful, stack pillows under your chest.  Allow your body to relax so that your hips drop lower and make contact more completely with the floor.  Hold this position for __________ seconds.  Slowly return to lying flat on the floor. Repeat __________ times. Complete this exercise __________ times per day.  RANGE OF MOTION - Extension, Prone Press Ups   Lie on your stomach on the floor, a bed will be too soft. Place your palms about shoulder width apart and at the height of your  head.  Keeping your back as relaxed as possible, slowly straighten your elbows while keeping your hips on the floor. You may adjust the placement of your hands to maximize your comfort. As you gain motion, your hands will come more underneath your shoulders.  Hold this position __________ seconds.  Slowly return to lying flat on the floor. Repeat __________ times. Complete this exercise __________ times per day.  RANGE OF MOTION- Quadruped, Neutral Spine   Assume a hands and knees position on a firm surface. Keep your hands under your shoulders and your knees under your hips. You may place padding under your knees for comfort.  Drop your head and point your tail bone toward the ground below you. This will round out your low back like an angry cat. Hold this position for __________ seconds.  Slowly lift your head and release your tail bone so that your back sags into a large arch, like an old horse.  Hold this position for __________ seconds.  Repeat this until you feel limber in your low back.  Now, find your "sweet spot." This will be the most comfortable position somewhere between the two previous positions. This is your neutral spine. Once you have found this position, tense your stomach muscles to support your low back.  Hold this position for __________ seconds. Repeat __________ times. Complete this exercise __________ times per day.  STRETCH - Flexion, Single Knee to Chest   Lie on a firm bed or floor with both legs extended in front of you.  Keeping one leg in contact with the floor, bring your opposite knee to your chest. Hold your leg in place by either grabbing behind your thigh or at your knee.  Pull until you feel a gentle stretch in your low back. Hold __________ seconds.  Slowly release your grasp and repeat the exercise with the opposite side. Repeat __________ times. Complete this exercise __________ times per day.  STRETCH - Hamstrings, Standing  Stand or sit and  extend your right / left leg, placing your foot on a chair or foot stool  Keeping a slight arch in your low back and your hips straight forward.  Lead with your chest and lean forward at the waist until you feel a gentle stretch in the back of your right / left knee or thigh. (When done correctly, this exercise requires leaning only a small distance.)  Hold this position for __________ seconds. Repeat __________ times. Complete this stretch __________ times per day. STRENGTHENING - Deep Abdominals, Pelvic Tilt   Lie on a firm bed or floor. Keeping your legs in front of you, bend your knees so they are both pointed toward the ceiling and your feet are flat on the floor.  Tense your lower abdominal muscles to press your low back into the floor. This motion will rotate your pelvis so that your tail bone is scooping upwards rather than pointing at your feet or into the floor.  With a gentle tension and even breathing, hold this position for __________ seconds. Repeat __________ times. Complete this exercise __________ times per day.  STRENGTHENING - Abdominals, Crunches   Lie on a firm bed or floor. Keeping your legs in front of you, bend your knees so they are both pointed toward the ceiling and your feet are flat on the floor. Cross your arms over your chest.  Slightly tip your chin down without bending your neck.  Tense your abdominals and slowly lift your trunk high enough to just clear your shoulder blades. Lifting higher can put excessive stress on the low back and does not further strengthen your abdominal muscles.  Control your return to the starting position. Repeat __________ times. Complete this exercise __________ times per day.  STRENGTHENING - Quadruped, Opposite UE/LE Lift   Assume a hands and knees position on a firm surface. Keep your hands under your shoulders and your knees under your hips. You may place padding under your knees for comfort.  Find your neutral spine and  gently tense your abdominal muscles so that you can maintain this position. Your shoulders and hips should form a rectangle that is parallel with the floor and is not twisted.  Keeping your trunk steady, lift your right hand no higher than your shoulder and then  Back Exercises These exercises may help you when beginning to rehabilitate your injury. Your symptoms may resolve with or without further involvement from your physician, physical therapist or athletic trainer. While completing these exercises, remember:  Restoring tissue flexibility helps normal motion to return to the joints. This allows healthier, less painful movement and activity. An effective stretch should be held for at least 30 seconds. A stretch should never be painful. You should only feel a gentle lengthening or release in the stretched tissue. STRETCH - Extension, Prone on Elbows  Lie on your stomach on the floor, a bed will be too soft. Place your palms about shoulder width apart and at the height of your head. Place your elbows under your shoulders. If this is too painful, stack pillows under your chest. Allow your body to relax so that your hips drop lower and make contact more completely with the floor. Hold this position for __________ seconds. Slowly return to lying flat on the floor. Repeat __________ times. Complete this exercise __________ times per day.  RANGE OF MOTION - Extension, Prone Press Ups  Lie on your stomach on the floor, a bed will be too soft. Place your palms about shoulder width apart and at the height of your head. Keeping your back as relaxed as possible, slowly straighten your elbows while keeping your hips on the floor. You may adjust the placement of your hands to maximize your comfort. As you gain motion, your hands will come more underneath your shoulders. Hold this position __________ seconds. Slowly return to lying flat on the floor. Repeat __________ times. Complete this exercise __________  times per day.  RANGE OF MOTION- Quadruped, Neutral Spine  Assume a hands and knees position on a firm surface. Keep your hands under your shoulders and your knees under your hips. You may place padding under your knees for comfort. Drop your head and point your tail bone toward the ground below you. This will round out your low back like an angry cat. Hold this position for __________ seconds. Slowly lift your head and release your tail bone so that your back sags into a large arch, like an old horse. Hold this position for __________ seconds. Repeat this until you feel limber in your low back. Now, find your "sweet spot." This will be the most comfortable position somewhere between the two previous positions. This is your neutral spine. Once you have found this position, tense your stomach muscles to support your low back. Hold this position for __________ seconds. Repeat __________ times. Complete this exercise __________ times per day.  STRETCH - Flexion, Single Knee to Chest  Lie on a firm bed or floor with both legs extended in front of you. Keeping one leg in contact with the floor, bring your opposite knee to your chest. Hold your leg in place by either grabbing behind your thigh or at your knee. Pull until you feel a gentle stretch in your low back. Hold __________ seconds. Slowly release your grasp and repeat the exercise with the opposite side. Repeat __________ times. Complete this exercise __________ times per day.  STRETCH - Hamstrings, Standing Stand or sit and extend your right / left leg, placing your foot on a chair or foot stool Keeping a slight arch in your low back and your hips straight forward. Lead with your chest and lean forward at the waist until you feel a gentle stretch in the back of your right / left knee or thigh. (When done correctly, this exercise requires leaning only a small distance.) Hold this position for __________ seconds. Repeat __________ times.  Complete this stretch __________ times per day. STRENGTHENING - Deep Abdominals, Pelvic Tilt  Lie on a firm bed or floor. Keeping your legs in front of you, bend your knees so they are both pointed toward the ceiling and your feet are flat on the floor. Tense your lower abdominal muscles to press your low back into the floor. This motion will rotate your pelvis so that your tail bone is scooping upwards rather than pointing at your feet or into the floor. With a gentle tension and even breathing, hold this position for __________ seconds. Repeat __________ times. Complete this exercise __________ times per day.  STRENGTHENING - Abdominals, Crunches  Lie on a firm bed or floor. Keeping your legs in front of you, bend your knees so they are both pointed toward the ceiling and your feet are flat on the floor. Cross your arms over your chest. Slightly tip your chin down without bending your neck. Tense your abdominals and slowly lift your trunk high enough to just clear your shoulder blades. Lifting higher can put excessive stress on the low back and does not further strengthen your abdominal muscles. Control your return to the starting position. Repeat __________ times. Complete this exercise __________ times per day.  STRENGTHENING - Quadruped, Opposite UE/LE Lift  Assume a hands and knees position on a firm surface. Keep your hands under your shoulders and your knees under your hips. You may place padding under your knees for comfort. Find your neutral spine and gently tense your abdominal muscles  so that you can maintain this position. Your shoulders and hips should form a rectangle that is parallel with the floor and is not twisted. Keeping your trunk steady, lift your right hand no higher than your shoulder and then your left leg no higher than your hip. Make sure you are not holding your breath. Hold this position __________ seconds. Continuing to keep your abdominal muscles tense and your back  steady, slowly return to your starting position. Repeat with the opposite arm and leg. Repeat __________ times. Complete this exercise __________ times per day. Document Released: 06/05/2005 Document Revised: 08/10/2011 Document Reviewed: 08/30/2008 [facility] Patient Information 2015 [place], [place]. This information is not intended to replace advice given to you by your health care provider. Make sure you discuss any questions you have with your health care provider.  your left leg no higher than your hip. Make sure you are not holding your breath. Hold this position __________ seconds.  Continuing to keep your abdominal muscles tense and your back steady, slowly return to your starting position. Repeat with the opposite arm and leg. Repeat __________ times. Complete this exercise __________ times per day. Document Released: 06/05/2005 Document Revised: 08/10/2011 Document Reviewed: 08/30/2008 [facility] Patient Information 2015 [place], [place]. This information is not intended to replace advice given to you by your health care provider. Make sure you discuss any questions you have with your health care provider.

## 2014-03-27 NOTE — Assessment & Plan Note (Signed)
Patient has severe osteoarthritic changes of the neck as well as bilateral severe foraminal narrowing that is likely contributing to her pain at the C4 level. We discussed continuing conservative therapy as well as getting patient prescribed a new muscle relaxant. I also think that an epidural steroid injection will hopefully be therapeutic as well as diagnostic. Patient was discharged from formal physical therapy due to noncompliance. This is a very difficult case with patient's other comorbidities. We will see how patient response to the epidural steroid injection and have patient follow-up in 2 weeks afterwards. Patient will continue the conservative therapy was given more postural exercises. Patient was shown proper technique.  Spent greater than 25 minutes with patient face-to-face and had greater than 50% of counseling including as described above in assessment and plan.

## 2014-03-27 NOTE — Assessment & Plan Note (Signed)
Patient is having bilateral leg symptoms that could be contributory to her back. Based on the amount of osteophytic changes we have seen in her neck and would not be surprised patient is also having some in her lower back. X-rays were ordered today. Conservative therapy with home exercises, icing, suggestions of over-the-counter medications. No pain medications will be prescribed with patient going to the pain medicine clinic. Patient also has multiple drug allergies will avoid any medications when possible. Patient will try these different interventions and if any worsening pain we may need to consider repeat MRI.

## 2014-03-29 ENCOUNTER — Telehealth: Payer: Self-pay | Admitting: Internal Medicine

## 2014-03-29 NOTE — Telephone Encounter (Signed)
Pt wanted to let Dr Tamala Julian know she found the name of the pain medication she has taken for migraines, the name is Meperidine and Dr Melton Alar has prescribed in the past.

## 2014-03-30 NOTE — Telephone Encounter (Signed)
Noted  

## 2014-04-09 ENCOUNTER — Telehealth: Payer: Self-pay | Admitting: Internal Medicine

## 2014-04-09 NOTE — Telephone Encounter (Signed)
Pt called in and thought that dr Laura Barnes put in a referral for her to have an epidural.  I didn't see an order.  She was just wonder when she would hear something back.

## 2014-04-23 ENCOUNTER — Other Ambulatory Visit: Payer: Self-pay | Admitting: *Deleted

## 2014-04-23 DIAGNOSIS — M5030 Other cervical disc degeneration, unspecified cervical region: Secondary | ICD-10-CM

## 2014-04-24 ENCOUNTER — Other Ambulatory Visit (INDEPENDENT_AMBULATORY_CARE_PROVIDER_SITE_OTHER): Payer: Medicare Other

## 2014-04-24 DIAGNOSIS — Z0000 Encounter for general adult medical examination without abnormal findings: Secondary | ICD-10-CM | POA: Diagnosis not present

## 2014-04-24 DIAGNOSIS — Z79899 Other long term (current) drug therapy: Secondary | ICD-10-CM | POA: Diagnosis not present

## 2014-04-24 DIAGNOSIS — E785 Hyperlipidemia, unspecified: Secondary | ICD-10-CM | POA: Diagnosis not present

## 2014-04-24 DIAGNOSIS — R350 Frequency of micturition: Secondary | ICD-10-CM | POA: Diagnosis not present

## 2014-04-24 DIAGNOSIS — E039 Hypothyroidism, unspecified: Secondary | ICD-10-CM | POA: Diagnosis not present

## 2014-04-24 LAB — LIPID PANEL
Cholesterol: 193 mg/dL (ref 0–200)
Cholesterol: 211 mg/dL — ABNORMAL HIGH (ref 0–200)
HDL: 39.2 mg/dL (ref >39.00)
HDL: 84 mg/dL (ref >39.00)
LDL Cholesterol: 104 mg/dL — ABNORMAL HIGH (ref 0–99)
LDL Cholesterol: 131 mg/dL — ABNORMAL HIGH (ref 0–99)
NonHDL: 127
NonHDL: 153.8
Total CHOL/HDL Ratio: 3
Total CHOL/HDL Ratio: 5
Triglycerides: 112 mg/dL (ref 0.0–149.0)
Triglycerides: 115 mg/dL (ref 0.0–149.0)
VLDL: 22.4 mg/dL (ref 0.0–40.0)
VLDL: 23 mg/dL (ref 0.0–40.0)

## 2014-04-24 LAB — CBC WITH DIFFERENTIAL/PLATELET
Basophils Absolute: 0 10*3/uL (ref 0.0–0.1)
Basophils Relative: 0.3 % (ref 0.0–3.0)
Eosinophils Absolute: 0.2 10*3/uL (ref 0.0–0.7)
Eosinophils Relative: 2.2 % (ref 0.0–5.0)
HCT: 41 % (ref 36.0–46.0)
Hemoglobin: 13.6 g/dL (ref 12.0–15.0)
Lymphocytes Relative: 34.6 % (ref 12.0–46.0)
Lymphs Abs: 3 10*3/uL (ref 0.7–4.0)
MCHC: 33.3 g/dL (ref 30.0–36.0)
MCV: 97.7 fl (ref 78.0–100.0)
Monocytes Absolute: 0.9 10*3/uL (ref 0.1–1.0)
Monocytes Relative: 10 % (ref 3.0–12.0)
Neutro Abs: 4.6 10*3/uL (ref 1.4–7.7)
Neutrophils Relative %: 52.9 % (ref 43.0–77.0)
Platelets: 274 10*3/uL (ref 150.0–400.0)
RBC: 4.2 Mil/uL (ref 3.87–5.11)
RDW: 14.3 % (ref 11.5–15.5)
WBC: 8.6 10*3/uL (ref 4.0–10.5)

## 2014-04-24 LAB — URINALYSIS, ROUTINE W REFLEX MICROSCOPIC
Bilirubin Urine: NEGATIVE
Hgb urine dipstick: NEGATIVE
Ketones, ur: NEGATIVE
Leukocytes, UA: NEGATIVE
Nitrite: NEGATIVE
RBC / HPF: NONE SEEN (ref 0–?)
Specific Gravity, Urine: 1.01 (ref 1.000–1.030)
Total Protein, Urine: NEGATIVE
Urine Glucose: NEGATIVE
Urobilinogen, UA: 0.2 (ref 0.0–1.0)
pH: 7 (ref 5.0–8.0)

## 2014-04-24 LAB — HEPATIC FUNCTION PANEL
ALT: 17 U/L (ref 0–35)
AST: 26 U/L (ref 0–37)
Albumin: 3.8 g/dL (ref 3.5–5.2)
Alkaline Phosphatase: 38 U/L — ABNORMAL LOW (ref 39–117)
Bilirubin, Direct: 0.1 mg/dL (ref 0.0–0.3)
Total Bilirubin: 0.8 mg/dL (ref 0.2–1.2)
Total Protein: 7.4 g/dL (ref 6.0–8.3)

## 2014-04-24 LAB — BASIC METABOLIC PANEL
BUN: 12 mg/dL (ref 6–23)
BUN: 13 mg/dL (ref 6–23)
CO2: 22 mEq/L (ref 19–32)
CO2: 28 mEq/L (ref 19–32)
Calcium: 9.5 mg/dL (ref 8.4–10.5)
Calcium: 9.6 mg/dL (ref 8.4–10.5)
Chloride: 104 mEq/L (ref 96–112)
Chloride: 108 mEq/L (ref 96–112)
Creatinine, Ser: 0.9 mg/dL (ref 0.4–1.2)
Creatinine, Ser: 1 mg/dL (ref 0.4–1.2)
GFR: 55.78 mL/min — ABNORMAL LOW (ref >60.00)
GFR: 61.99 mL/min (ref >60.00)
Glucose, Bld: 101 mg/dL — ABNORMAL HIGH (ref 70–99)
Glucose, Bld: 104 mg/dL — ABNORMAL HIGH (ref 70–99)
Potassium: 4 mEq/L (ref 3.5–5.1)
Potassium: 4.1 mEq/L (ref 3.5–5.1)
Sodium: 140 mEq/L (ref 135–145)
Sodium: 143 mEq/L (ref 135–145)

## 2014-04-24 LAB — TSH: TSH: 2.54 u[IU]/mL (ref 0.35–4.50)

## 2014-05-01 ENCOUNTER — Encounter: Payer: Self-pay | Admitting: Internal Medicine

## 2014-05-01 ENCOUNTER — Other Ambulatory Visit: Payer: Medicare Other

## 2014-05-01 ENCOUNTER — Ambulatory Visit (INDEPENDENT_AMBULATORY_CARE_PROVIDER_SITE_OTHER): Payer: Medicare Other | Admitting: Internal Medicine

## 2014-05-01 VITALS — BP 120/84 | HR 96 | Temp 98.3°F | Ht 62.0 in | Wt 148.2 lb

## 2014-05-01 DIAGNOSIS — Z0000 Encounter for general adult medical examination without abnormal findings: Secondary | ICD-10-CM

## 2014-05-01 DIAGNOSIS — Z23 Encounter for immunization: Secondary | ICD-10-CM

## 2014-05-01 DIAGNOSIS — G894 Chronic pain syndrome: Secondary | ICD-10-CM | POA: Insufficient documentation

## 2014-05-01 DIAGNOSIS — Z9886 Personal history of breast implant removal: Secondary | ICD-10-CM | POA: Insufficient documentation

## 2014-05-01 DIAGNOSIS — E785 Hyperlipidemia, unspecified: Secondary | ICD-10-CM | POA: Insufficient documentation

## 2014-05-01 DIAGNOSIS — E7800 Pure hypercholesterolemia, unspecified: Secondary | ICD-10-CM | POA: Insufficient documentation

## 2014-05-01 DIAGNOSIS — J018 Other acute sinusitis: Secondary | ICD-10-CM

## 2014-05-01 DIAGNOSIS — N6459 Other signs and symptoms in breast: Secondary | ICD-10-CM | POA: Insufficient documentation

## 2014-05-01 MED ORDER — TRAMADOL HCL ER 300 MG PO TB24: 300.0000 mg | ORAL_TABLET | Freq: Every day | ORAL | Status: DC | PRN

## 2014-05-01 NOTE — Assessment & Plan Note (Signed)

## 2014-05-01 NOTE — Progress Notes (Signed)
Pre visit review using our clinic review tool, if applicable. No additional management support is needed unless otherwise documented below in the visit note. 

## 2014-05-01 NOTE — Patient Instructions (Signed)
You had the flu shot today  Your EKG was OK today  Your lab work was OK as well  OK to stop the tramadol 50 mg  Please take all new medication as prescribed - the once per day Ultram ER 300 mg  Please continue all other medications as before, and refills have been done if requested.  Please have the pharmacy call with any other refills you may need.  Please continue your efforts at being more active, low cholesterol diet, and weight control.  You are otherwise up to date with prevention measures today.  Please keep your appointments with your specialists as you may have planned  You will be contacted regarding the referral for: plastic surgury  Please return in 6 months, or sooner if needed

## 2014-05-01 NOTE — Assessment & Plan Note (Signed)
Pt requests f/u with Dr holderness/plastic surg due to ? Of leakage

## 2014-05-01 NOTE — Progress Notes (Signed)
Subjective:    Patient ID: Laura Barnes, female    DOB: July 14, 1940, 73 y.o.   MRN: [id number]  HPI  Here for wellness and f/u;  Overall doing ok;  Pt denies CP, worsening SOB, DOE, wheezing, orthopnea, PND, worsening LE edema, palpitations, dizziness or syncope.  Pt denies neurological change such as new headache, facial or extremity weakness.  Pt denies polydipsia, polyuria, or low sugar symptoms. Pt states overall good compliance with treatment and medications, good tolerability, and has been trying to follow lower cholesterol diet.  Pt denies worsening depressive symptoms, suicidal ideation or panic. No fever, night sweats, wt loss, loss of appetite, or other constitutional symptoms.  Pt states good ability with ADL's, has low fall risk, home safety reviewed and adequate, no other significant changes in hearing or vision, and only occasionally active with exercise.  Pt continues to have recurring LBP without change in severity, bowel or bladder change, fever, wt loss,  worsening LE pain/numbness/weakness, gait change or falls - for ESI to cervical and lumbar to be done soon in approx 2 wks a t [place] [facility].  MRI cervical and lumbar as documented  - mod to severe deg changes.  Asks for increased tramadol for pain control until esi.   Has hx of FMS butnot using for this. Had recenbt URI now improved Past Medical History  Diagnosis Date  . HYPERLIPIDEMIA   . ANXIETY   . DEPRESSION   . MIGRAINE, COMMON   . MIGRAINE HEADACHE   . VENOUS INSUFFICIENCY, CHRONIC   . SINUSITIS- ACUTE-NOS   . ALLERGIC RHINITIS   . GERD   . IBS   . VAGINITIS   . SKIN LESION   . Enthesopathy of hip region   . FOOT PAIN, BILATERAL   . PLANTAR FASCIITIS   . OSTEOPOROSIS   . OSTEOPENIA   . RASH-NONVESICULAR   . NECK MASS   . Hiatal hernia   . Hypothyroidism   . Bronchitis   . Anemia   . Complication of anesthesia     says one time waking up she couldn't breathe, felt like her throat closing up  .  Family history of anesthesia complication     sister with n/v   Past Surgical History  Procedure Laterality Date  . Appendectomy    . Cholecystectomy    . 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 Oophorectomy    . Shoulder surgery    . S/p neck lump removal    . Breast enhancement surgery    . Vaginal hysterectomy    . Tonsillectomy    . Colonoscopy    . Sinus endo w/fusion Right 06/30/2013    Procedure: RIGHT ENDOSCOPIC SPHENOIDECTOMY WITH FUSION SCAN;  Surgeon: [name], MD;  Location: [facility];  Service: ENT;  Laterality: Right;    reports that she has never smoked. She has never used smokeless tobacco. She reports that she does not drink alcohol or use illicit drugs. family history includes Breast cancer in her maternal grandmother; Diabetes in her mother, other, and sister; Heart disease in her father, maternal aunt, and maternal uncle; Kidney disease in her paternal uncle. There is no history of Colon cancer, Colon polyps, Rectal cancer, or Stomach cancer. Allergies  Allergen Reactions  . Prednisone Anaphylaxis  . Amoxicillin-Pot Clavulanate   . Aspirin     Stomach ache and bleed  . Doxycycline   . Erythromycin   . Hydrocodone Itching  .  Hydrocodone-Acetaminophen Itching  . Latex     blisters  . Levofloxacin     Headaches, GI upset  . Methylphenidate Hcl   . Mirtazapine   . Nsaids     REACTION: stomach bleeding per pt  . Rofecoxib   . Sertraline Hcl   . Statins Nausea Only    Headache, upset stomach, joint hurt, heartburn  . Sulfonamide Derivatives   . Sumatriptan     Current Outpatient Prescriptions on File Prior to Visit  Medication Sig Dispense Refill  . ALPRAZolam (XANAX) 0.5 MG tablet TAKE 1 TABLET BY MOUTH TWICE A DAY AS NEEDED 60 tablet 5  . B Complex-Biotin-FA (B COMPLETE) TABS Take 1 tablet by mouth daily. B Complete 100mg     . baclofen (LIORESAL) 10 MG tablet Take 1 tablet (10 mg total) by mouth 3 (three) times daily.  90 each 0  . buPROPion (WELLBUTRIN XL) 300 MG 24 hr tablet Take 1 tablet (300 mg total) by mouth daily. 90 tablet 3  . calcium carbonate (OS-CAL) 600 MG TABS tablet Take 600 mg by mouth 2 (two) times daily with a meal.     . cetirizine (ZYRTEC) 10 MG tablet TAKE 1 TABLET (10 MG TOTAL) BY MOUTH DAILY. 30 tablet 11  . cholecalciferol (VITAMIN D) 1000 UNITS tablet Take 1,000 Units by mouth daily.    . citalopram (CELEXA) 10 MG tablet Take 1 tablet (10 mg total) by mouth 2 (two) times daily. 60 tablet 5  . clidinium-chlordiazePOXIDE (LIBRAX) 5-2.5 MG per capsule Take 1 capsule by mouth daily as needed (for stomach).    . Coenzyme Q10 (CO Q-10) 200 MG CAPS Take 1 capsule by mouth 2 (two) times daily.    Marland Kitchen esomeprazole (NEXIUM) 40 MG capsule Take 1 capsule (40 mg total) by mouth 2 (two) times daily before a meal. 60 capsule 11  . estradiol (ESTRACE) 1 MG tablet Take 1 tablet (1 mg total) by mouth daily. 90 tablet 3  . levothyroxine (SYNTHROID, LEVOTHROID) 25 MCG tablet Take 1 tablet (25 mcg total) by mouth daily before breakfast. 90 tablet 3  . Meperidine HCl (DEMEROL PO) Take by mouth. Takes for headaches unsure dose    . Multiple Vitamin (MULTIVITAMIN WITH MINERALS) TABS tablet Take 1 tablet by mouth daily.    . nitrofurantoin, macrocrystal-monohydrate, (MACROBID) 100 MG capsule Take 1 capsule (100 mg total) by mouth 2 (two) times daily. 20 capsule 0  . Omega-3 Fatty Acids (SALMON OIL-1000 PO) Take 1,000 mg by mouth daily.    Marland Kitchen OVER THE COUNTER MEDICATION Tumeric 1 tablet twice daily    . OVER THE COUNTER MEDICATION Tumeric twice daily.    . potassium chloride (KLOR-CON 10) 10 MEQ tablet Take 1 tablet (10 mEq total) by mouth daily. 90 tablet 3  . Red Yeast Rice Extract (RED YEAST RICE PO) Take 1 capsule by mouth 2 (two) times daily. Red Yeast Rice Complex    . topiramate (TOPAMAX) 50 MG tablet Take 50-250 mg by mouth 2 (two) times daily. Take 50mg  (1 tablet) daily in the morning.  Take 250mg  (5  tablets) daily at bedtime    . traMADol (ULTRAM) 50 MG tablet TAKE 1 TABLETS BY MOUTH EVERY 6 HOURS AS NEEDED FOR PAIN 60 tablet 2  . triamcinolone cream (KENALOG) 0.5 % Apply topically 2 (two) times daily. As needed for rash 30 g 0  . triamterene-hydrochlorothiazide (MAXZIDE-25) 37.5-25 MG per tablet Take 0.5 tablets by mouth daily. 30 tablet 11   No current facility-administered  medications on file prior to visit.   Review of Systems Constitutional: Negative for increased diaphoresis, other activity, appetite or other siginficant weight change  HENT: Negative for worsening hearing loss, ear pain, facial swelling, mouth sores and neck stiffness.   Eyes: Negative for other worsening pain, redness or visual disturbance.  Respiratory: Negative for shortness of breath and wheezing.   Cardiovascular: Negative for chest pain and palpitations.  Gastrointestinal: Negative for diarrhea, blood in stool, abdominal distention or other pain Genitourinary: Negative for hematuria, flank pain or change in urine volume.  Musculoskeletal: Negative for myalgias or other joint complaints.  Skin: Negative for color change and wound.  Neurological: Negative for syncope and numbness. other than noted Hematological: Negative for adenopathy. or other swelling Psychiatric/Behavioral: Negative for hallucinations, self-injury, decreased concentration or other worsening agitation.      Objective:   Physical Exam BP 120/84 mmHg  Pulse 96  Temp(Src) 98.3 F (36.8 C) (Oral)  Ht 5\' 2"  (1.575 m)  Wt 148 lb 4 oz (67.246 kg)  BMI 27.11 kg/m2  SpO2 98% VS noted,  Constitutional: Pt is oriented to person, place, and time. Appears well-developed and well-nourished.  Head: Normocephalic and atraumatic.  Right Ear: External ear normal.  Left Ear: External ear normal.  Nose: Nose normal.  Mouth/Throat: Oropharynx is clear and moist.  Eyes: Conjunctivae and EOM are normal. Pupils are equal, round, and reactive to  light.  Neck: Normal range of motion. Neck supple. No JVD present. No tracheal deviation present.  Cardiovascular: Normal rate, regular rhythm, normal heart sounds and intact distal pulses.   Pulmonary/Chest: Effort normal and breath sounds without rales or wheezing  Abdominal: Soft. Bowel sounds are normal. NT. No HSM  Musculoskeletal: Normal range of motion. Exhibits no edema.  Lymphadenopathy:  Has no cervical adenopathy.  Neurological: Pt is alert and oriented to person, place, and time. Pt has normal reflexes. No cranial nerve deficit. Motor grossly intact Skin: Skin is warm and dry. No rash noted.  Psychiatric:  Has normal mood and affect. Behavior is normal.  Spine: tender lower cercial and lumbar palpation , mod    Assessment & Plan:

## 2014-05-02 ENCOUNTER — Telehealth: Payer: Self-pay | Admitting: Internal Medicine

## 2014-05-02 DIAGNOSIS — J019 Acute sinusitis, unspecified: Secondary | ICD-10-CM | POA: Insufficient documentation

## 2014-05-02 DIAGNOSIS — J0120 Acute ethmoidal sinusitis, unspecified: Secondary | ICD-10-CM | POA: Insufficient documentation

## 2014-05-02 MED ORDER — CEPHALEXIN 500 MG PO CAPS: 500.0000 mg | ORAL_CAPSULE | Freq: Four times a day (QID) | ORAL | Status: DC

## 2014-05-02 NOTE — Telephone Encounter (Signed)
Patient states Dr. Jenny Reichmann was to call in an antibiotic to [facility] on [address] for a sinus infection but pharmacy has not received yet.

## 2014-05-02 NOTE — Telephone Encounter (Signed)
antibx sent

## 2014-05-02 NOTE — Telephone Encounter (Signed)
Patient informed. 

## 2014-05-04 ENCOUNTER — Telehealth: Payer: Self-pay | Admitting: Internal Medicine

## 2014-05-04 ENCOUNTER — Ambulatory Visit
Admission: RE | Admit: 2014-05-04 | Discharge: 2014-05-04 | Disposition: A | Discharge status: Home / Self Care | Payer: Medicare Other | Source: Ambulatory Visit | Attending: Family Medicine | Admitting: Family Medicine

## 2014-05-04 DIAGNOSIS — G894 Chronic pain syndrome: Secondary | ICD-10-CM

## 2014-05-04 DIAGNOSIS — M5030 Other cervical disc degeneration, unspecified cervical region: Secondary | ICD-10-CM

## 2014-05-04 MED ORDER — TRIAMCINOLONE ACETONIDE 40 MG/ML IJ SUSP (RADIOLOGY)
60.0000 mg | Freq: Once | INTRAMUSCULAR | Status: AC
  Administered 2014-05-04: 60 mg via EPIDURAL

## 2014-05-04 MED ORDER — IOHEXOL 300 MG/ML  SOLN
1.0000 mL | Freq: Once | INTRAMUSCULAR | Status: AC | PRN
  Administered 2014-05-04: 1 mL via EPIDURAL

## 2014-05-04 NOTE — Telephone Encounter (Signed)
Very sorry, I dont really have an alternative except to go back to the ultram 50 mg as needed (to not take the ultram ER)

## 2014-05-04 NOTE — Telephone Encounter (Signed)
Pt called and wanted to let MD know, the med - Tramadol is over $300 and she cannot buy that, alternative? Pls let pt know.

## 2014-05-04 NOTE — Discharge Instructions (Signed)

## 2014-05-05 NOTE — Assessment & Plan Note (Signed)
To try ultram ER 300 qd, would avoid other higher class narcotic for type of pain

## 2014-05-05 NOTE — Assessment & Plan Note (Signed)
stable overall by history and exam, recent data reviewed with pt, and pt to continue medical treatment as before,  to f/u any worsening symptoms or concerns Lab Results  Component Value Date   LDLCALC 131* 04/24/2014   LDLCALC 104* 04/24/2014

## 2014-05-05 NOTE — Assessment & Plan Note (Signed)
Mild to mod, for antibx course,  to f/u any worsening symptoms or concerns 

## 2014-05-08 ENCOUNTER — Telehealth: Payer: Self-pay | Admitting: Internal Medicine

## 2014-05-08 MED ORDER — TRAMADOL HCL 50 MG PO TABS: 50.0000 mg | ORAL_TABLET | Freq: Three times a day (TID) | ORAL | Status: DC | PRN

## 2014-05-08 NOTE — Telephone Encounter (Signed)
Done hardcopy to MM 

## 2014-05-08 NOTE — Telephone Encounter (Signed)
Pt request Dr. Jenny Reichmann to give her Utram 50 mg 2 tab 3 times a day instead of tramadol. Please advise.

## 2014-05-09 NOTE — Telephone Encounter (Signed)
Laura Barnes fax script to [facility].../lmb 

## 2014-05-14 ENCOUNTER — Telehealth: Payer: Self-pay | Admitting: Family Medicine

## 2014-05-14 DIAGNOSIS — M5416 Radiculopathy, lumbar region: Secondary | ICD-10-CM

## 2014-05-14 NOTE — Telephone Encounter (Signed)
Order entered

## 2014-05-14 NOTE — Telephone Encounter (Signed)
Discussed with patient at this time. Patient states that the epidural in her neck only helped for approximately 1 week and she continues to have bilateral shoulder pain. In addition of this she stating that her lower back is severe pain patient states that it is stopping her from ambulation at this time. Patient denies any weakness in the legs but states that it is severe. Patient does have facet arthritis as well as degenerative disc disease at L4-L5 L5-S1 on x-rays back in 03/27/2014. We will potentially order an epidural steroid injection per patient's request. Discussing with patient at this time though I do not know if he will get approved without an MRI. Patient once the epidural and is adamant she can get it without the MRI. We will put in order and see if they will accept. Patient then encouraged to try to see me again in 2 weeks after the epidural steroid injection.

## 2014-05-14 NOTE — Telephone Encounter (Signed)
Please request phone called concern about injection that Dr. Tamala Julian send her to get it at [facility]. Please call pt

## 2014-05-17 ENCOUNTER — Other Ambulatory Visit: Payer: Self-pay | Admitting: Internal Medicine

## 2014-05-17 DIAGNOSIS — Z1231 Encounter for screening mammogram for malignant neoplasm of breast: Secondary | ICD-10-CM

## 2014-05-18 ENCOUNTER — Other Ambulatory Visit: Payer: Self-pay | Admitting: Family Medicine

## 2014-05-21 ENCOUNTER — Other Ambulatory Visit: Payer: Self-pay | Admitting: Internal Medicine

## 2014-05-21 ENCOUNTER — Ambulatory Visit
Admission: RE | Admit: 2014-05-21 | Discharge: 2014-05-21 | Disposition: A | Discharge status: Home / Self Care | Payer: Medicare Other | Source: Ambulatory Visit | Attending: Family Medicine | Admitting: Family Medicine

## 2014-05-21 DIAGNOSIS — M5416 Radiculopathy, lumbar region: Secondary | ICD-10-CM

## 2014-05-21 MED ORDER — METHYLPREDNISOLONE ACETATE 40 MG/ML INJ SUSP (RADIOLOG
120.0000 mg | Freq: Once | INTRAMUSCULAR | Status: AC
  Administered 2014-05-21: 120 mg via EPIDURAL

## 2014-05-21 MED ORDER — IOHEXOL 180 MG/ML  SOLN
1.0000 mL | Freq: Once | INTRAMUSCULAR | Status: AC | PRN
  Administered 2014-05-21: 1 mL via EPIDURAL

## 2014-05-21 NOTE — Telephone Encounter (Signed)
Refill done.  

## 2014-05-22 ENCOUNTER — Telehealth: Payer: Self-pay | Admitting: Internal Medicine

## 2014-05-22 MED ORDER — FLUTICASONE PROPIONATE 50 MCG/ACT NA SUSP: 2.0000 | Freq: Every day | NASAL | Status: DC

## 2014-05-22 NOTE — Telephone Encounter (Signed)
Pt called in and said that she was her on the 1st of dec for sinus infection.  She is still not better.  She wanted to know if Dr Jenny Reichmann could call in another Antibiotic for her.

## 2014-05-22 NOTE — Telephone Encounter (Signed)
At this point if no fever, I would suspect more along the lines of chronic sinususitis/allergies  I sent flonase if she is willing to try this - done erx

## 2014-05-22 NOTE — Telephone Encounter (Signed)
Patient informed of MD instructions. 

## 2014-05-25 ENCOUNTER — Other Ambulatory Visit: Payer: Self-pay | Admitting: Internal Medicine

## 2014-06-05 DIAGNOSIS — T8549XA Other mechanical complication of breast prosthesis and implant, initial encounter: Secondary | ICD-10-CM | POA: Diagnosis not present

## 2014-06-07 ENCOUNTER — Other Ambulatory Visit: Payer: Self-pay | Admitting: Internal Medicine

## 2014-06-07 ENCOUNTER — Ambulatory Visit
Admission: RE | Admit: 2014-06-07 | Discharge: 2014-06-07 | Disposition: A | Discharge status: Home / Self Care | Payer: Medicare Other | Source: Ambulatory Visit | Attending: Internal Medicine | Admitting: Internal Medicine

## 2014-06-07 ENCOUNTER — Encounter: Payer: Self-pay | Admitting: Internal Medicine

## 2014-06-07 DIAGNOSIS — Z1231 Encounter for screening mammogram for malignant neoplasm of breast: Secondary | ICD-10-CM

## 2014-06-14 ENCOUNTER — Other Ambulatory Visit: Payer: Self-pay | Admitting: Internal Medicine

## 2014-06-19 DIAGNOSIS — G43009 Migraine without aura, not intractable, without status migrainosus: Secondary | ICD-10-CM | POA: Diagnosis not present

## 2014-06-19 DIAGNOSIS — G44219 Episodic tension-type headache, not intractable: Secondary | ICD-10-CM | POA: Diagnosis not present

## 2014-06-23 ENCOUNTER — Other Ambulatory Visit: Payer: Self-pay | Admitting: Family Medicine

## 2014-06-25 NOTE — Telephone Encounter (Signed)
Refill done.  

## 2014-07-23 ENCOUNTER — Other Ambulatory Visit: Payer: Self-pay | Admitting: Internal Medicine

## 2014-07-24 NOTE — Telephone Encounter (Signed)
Done hardcopy to Cherina  

## 2014-07-24 NOTE — Telephone Encounter (Signed)
Faxed script back to [facility].../lmb 

## 2014-08-01 ENCOUNTER — Ambulatory Visit (INDEPENDENT_AMBULATORY_CARE_PROVIDER_SITE_OTHER): Payer: Medicare Other | Admitting: Family Medicine

## 2014-08-01 ENCOUNTER — Encounter: Payer: Self-pay | Admitting: Family Medicine

## 2014-08-01 ENCOUNTER — Telehealth: Payer: Self-pay | Admitting: Internal Medicine

## 2014-08-01 VITALS — BP 118/72 | HR 96 | Ht 62.0 in | Wt 156.0 lb

## 2014-08-01 DIAGNOSIS — M25512 Pain in left shoulder: Secondary | ICD-10-CM

## 2014-08-01 DIAGNOSIS — G894 Chronic pain syndrome: Secondary | ICD-10-CM

## 2014-08-01 DIAGNOSIS — M5030 Other cervical disc degeneration, unspecified cervical region: Secondary | ICD-10-CM | POA: Diagnosis not present

## 2014-08-01 MED ORDER — CITALOPRAM HYDROBROMIDE 20 MG PO TABS: 20.0000 mg | ORAL_TABLET | Freq: Two times a day (BID) | ORAL | Status: DC

## 2014-08-01 MED ORDER — TRAMADOL HCL 50 MG PO TABS: ORAL_TABLET | ORAL | Status: DC

## 2014-08-01 NOTE — Telephone Encounter (Signed)
Rx sent 

## 2014-08-01 NOTE — Progress Notes (Signed)
[facility] [place], [place] [address] Phone: [phone] Subjective:     CC: Bilateral shoulder pain and neck pain.   [id number] Laura Barnes is a 74 y.o. female coming in for followup of her neck pain and bilateral shoulder pain. 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 Patient is also seen by a pain management doctor. Patient has not been seen for greater than 3 months. Patient had an MRI after her last evaluation of her cervical spine. Patient's MRI 12/18/2013. 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 Continues to have left shoulder pain as well. Describes the pain is more of a dull aching sensation that is stopping her from regular daily activities. Patient states that this pain seems to be unrelenting. Continues to give her the significant difficulty even at night. Patient states that it is so severe that it is stopping her from different activities. Patient does have a history of fibromyalgia and she thinks she is having a flare as well.  Patient states that unfortunate lady all her muscles hurt. States that she is having more groin pain bilaterally. Denies any fevers or chills or any abnormal weight loss. Denies any recent changes in her diet or new pills.   ..    Past medical history, social, surgical and family history all reviewed in electronic medical record.   Review of Systems: No headache, visual changes, nausea, vomiting, diarrhea, constipation, dizziness, abdominal pain, skin rash, fevers, chills, night sweats, weight loss, swollen lymph nodes, body aches, joint swelling, muscle aches, chest pain, shortness of breath, mood changes.   Objective Blood pressure  118/72, pulse 96, height 5\' 2"  (1.575 m), weight 156 lb (70.761 kg), SpO2 94 %.  General: No apparent distress alert and oriented x3 mood and affect normal, dressed appropriately. Patient does have racing speech HEENT: Pupils equal, extraocular movements intact  Respiratory: Patient's speak in full sentences and does not appear short of breath  Cardiovascular: No lower extremity edema, non tender, no erythema  Skin: Warm dry intact with no signs of infection or rash on extremities or on axial skeleton.  Abdomen: Soft nontender  Neuro: Cranial nerves II through XII are intact, neurovascularly intact in all extremities with 2+ DTRs and 2+ pulses.  Lymph: No lymphadenopathy of posterior or anterior cervical chain or axillae bilaterally.  Gait normal with good balance and coordination.  MSK:  Non tender with full range of motion and good stability and symmetric strength and tone of shoulders, elbows, wrist, hip, knee and ankles bilaterally.  Neck: Inspection unremarkable. No palpable stepoffs. Positive Spurling's maneuver. Full neck range of motion Grip strength and sensation normal in bilateral hands Strength good C4 to T1 distribution, somewhat difficult to assess because patient is not being compliant No sensory change to C4 to T1 Negative Hoffman sign bilaterally Reflexes normal Patient has multiple places of tenderness along the medial aspect of the scapula bilaterally  Shoulder: left Inspection reveals no abnormalities, atrophy or asymmetry. Palpation is normal with no tenderness over AC joint or bicipital groove. ROM is full in all planes passively. Rotator cuff strength normal throughout. signs of impingement with positive Neer and Hawkin's tests, but negative empty can sign. Speeds and Yergason's tests normal. No labral pathology noted with negative  Obrien's, negative clunk and good stability. Normal scapular function observed. No painful arc and no drop arm sign. No  apprehension sign  MSK US performed of: left This study was ordered, performed, and interpreted by Charlann Boxer D.O.  Shoulder:   Supraspinatus:  Appears normal on long and transverse views, Bursal bulge seen with shoulder abduction on impingement view. Infraspinatus:  Appears normal on long and transverse views. Significant increase in Doppler flow Subscapularis:  Appears normal on long and transverse views. Positive bursa Teres Minor:  Appears normal on long and transverse views. AC joint:  Capsule undistended, no geyser sign. Glenohumeral Joint:  Appears normal without effusion. Glenoid Labrum:  Intact without visualized tears. Biceps Tendon:  Appears normal on long and transverse views, no fraying of tendon, tendon located in intertubercular groove, no subluxation with shoulder internal or external rotation.  Impression: Subacromial bursitis  Procedure: Real-time Ultrasound Guided Injection of left glenohumeral joint Device: GE Logiq E  Ultrasound guided injection is preferred based studies that show increased duration, increased effect, greater accuracy, decreased procedural pain, increased response rate with ultrasound guided versus blind injection.  Verbal informed consent obtained.  Time-out conducted.  Noted no overlying erythema, induration, or other signs of local infection.  Skin prepped in a sterile fashion.  Local anesthesia: Topical Ethyl chloride.  With sterile technique and under real time ultrasound guidance:  Joint visualized.  23g 1  inch needle inserted posterior approach. Pictures taken for needle placement. Patient did have injection of 2 cc of 1% lidocaine, 2 cc of 0.5% Marcaine, and 1.0 cc of Kenalog 40 mg/dL. Completed without difficulty  Pain immediately resolved suggesting accurate placement of the medication.  Advised to call if fevers/chills, erythema, induration, drainage, or persistent bleeding.  Images permanently stored and available for review in the  ultrasound unit.  Impression: Technically successful ultrasound guided injection.  Back Exam:  Inspection: Unremarkable  Motion: Flexion 35 deg, Extension 35 deg, Side Bending to 45 deg bilaterally,  Rotation to 45 deg bilaterally  SLR laying: Unable to do secondary to pain XSLR laying: Negative  Palpable tenderness: Diffuse nonspecific tenderness into light palpation FABER: Positive bilaterally Sensory change: Gross sensation intact to all lumbar and sacral dermatomes.  Reflexes: 2+ at both patellar tendons, 2+ at achilles tendons, Babinski's downgoing.  Strength at foot  Patient voluntarily would not let me do test secondary to pain.  Gait unremarkable.      Impression and Recommendations:     This case required medical decision making of moderate complexity.

## 2014-08-01 NOTE — Assessment & Plan Note (Signed)
Patient was injected today to see if this and make any resolution of her pain. I am highly not optimistic of this. I do think the patient's 5 miles and chronic pain syndrome are likely contributing more to her pain and that she needs more of a pain management. Patient was referred today. Has failed all other possible treatment options and patient has too many allergies for me to find another treatment for her. We did attempt to increase patient's Celexa once again not optimistic.

## 2014-08-01 NOTE — Telephone Encounter (Signed)
Done hardcopy to Cherina  

## 2014-08-01 NOTE — Telephone Encounter (Signed)
Patient would like for the doctor to change her Tramadol meds to two 46ml tablets, 3 times a day because the one 354ml tablet a day cost too much money.

## 2014-08-01 NOTE — Assessment & Plan Note (Signed)
Referred to  pain management. Patient does have many different medications that I think could be interacting and causing some of the discomfort the patient is unwilling to discuss that. I do believe that patient does not have any significant other changes that could be contributing look at her shoulder. Patient had an injection today and we'll see if that helps. Patient though does have the cervical degenerative disc disease did not respond well to the epidural steroid injections. I think that patient needs to be referred to pain management for further evaluation and treatment.

## 2014-08-01 NOTE — Progress Notes (Signed)
Pre visit review using our clinic review tool, if applicable. No additional management support is needed unless otherwise documented below in the visit note. 

## 2014-08-01 NOTE — Assessment & Plan Note (Signed)
Failed 2 epidural steroid injections.

## 2014-08-01 NOTE — Patient Instructions (Signed)
Good to see you Tried the shoulder injection to see if it helps.  Continue other medicines Will increase citalopram to 20 mg daily Pain management will be calling you. If we are doing better and you like the changes we may be able to continue and I will see you again in 4 weeks.

## 2014-08-02 ENCOUNTER — Telehealth: Payer: Self-pay | Admitting: Internal Medicine

## 2014-08-02 DIAGNOSIS — M797 Fibromyalgia: Secondary | ICD-10-CM

## 2014-08-02 NOTE — Telephone Encounter (Signed)
Done per emr 

## 2014-08-02 NOTE — Telephone Encounter (Signed)
Pt request referral for Rheumatology for Fibromyalgia. Please advise

## 2014-08-03 NOTE — Telephone Encounter (Signed)
Pt. Is aware of referral.

## 2014-08-07 DIAGNOSIS — H43811 Vitreous degeneration, right eye: Secondary | ICD-10-CM | POA: Diagnosis not present

## 2014-08-14 ENCOUNTER — Telehealth: Payer: Self-pay | Admitting: Internal Medicine

## 2014-08-14 NOTE — Telephone Encounter (Signed)
Pt called to check up on the RHEUMATOLOGY referral. Please call her back

## 2014-08-15 NOTE — Telephone Encounter (Signed)
Spoke w/pt. Referral faxed to [place] appt.

## 2014-08-29 ENCOUNTER — Encounter: Payer: Self-pay | Admitting: Family Medicine

## 2014-08-29 ENCOUNTER — Ambulatory Visit (INDEPENDENT_AMBULATORY_CARE_PROVIDER_SITE_OTHER): Payer: Medicare Other | Admitting: Family Medicine

## 2014-08-29 VITALS — BP 110/82 | HR 102 | Ht 62.0 in | Wt 155.0 lb

## 2014-08-29 DIAGNOSIS — M797 Fibromyalgia: Secondary | ICD-10-CM | POA: Diagnosis not present

## 2014-08-29 DIAGNOSIS — M25512 Pain in left shoulder: Secondary | ICD-10-CM | POA: Diagnosis not present

## 2014-08-29 NOTE — Progress Notes (Signed)
Pre visit review using our clinic review tool, if applicable. No additional management support is needed unless otherwise documented below in the visit note. 

## 2014-08-29 NOTE — Assessment & Plan Note (Signed)
Improved after the injection. Discussed that we can repeat this every 3-4 months if needed. Patient will follow-up on an as-needed basis.

## 2014-08-29 NOTE — Patient Instructions (Signed)
I  Am sorry on the rheumatologist Dr. Bobby Rumpf would be great Ice can help when needed Consider changing diet to higher omega 3 and lower omega 6  I am here if you have questions.

## 2014-08-29 NOTE — Progress Notes (Signed)
[facility] [address], [place] [address] Phone: [phone] Subjective:     CC: Bilateral shoulder pain and neck pain.   IRC:[id number] Laura Barnes is a 74 y.o. female coming in for followup of her neck pain and bilateral shoulder pain. 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 Patient is also seen by a pain management doctor. Patient has not been seen for greater than 3 months. Patient had an MRI after her last evaluation of her cervical spine. Patient's MRI 12/18/2013. 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   Patient continued to have left shoulder pain and had more of a subacromial bursitis and we did try an injection. Patient states that she is approximately 95% better from that. Still mild discomfort but she thinks this could be secondary to the fibromyalgia.  Continues to have difficulty with the chronic pain in the fibromyalgia. Patient is going to start following up with pain management in the near future.       Past medical history, social, surgical and family history all reviewed in electronic medical record.   Review of Systems: No headache, visual changes, nausea, vomiting, diarrhea, constipation, dizziness, abdominal pain, skin rash, fevers, chills, night sweats, weight loss, swollen lymph nodes, body aches, joint swelling, muscle aches, chest pain, shortness of breath, mood changes.   Objective Blood pressure 110/82, pulse 102, height 5\' 2"  (1.575 m), weight 155 lb (70.308 kg), SpO2 94 %.  General: No apparent distress alert and oriented x3 mood and affect normal, dressed appropriately. Patient does have racing speech HEENT: Pupils equal, extraocular movements  intact  Respiratory: Patient's speak in full sentences and does not appear short of breath  Cardiovascular: No lower extremity edema, non tender, no erythema  Skin: Warm dry intact with no signs of infection or rash on extremities or on axial skeleton.  Abdomen: Soft nontender  Neuro: Cranial nerves II through XII are intact, neurovascularly intact in all extremities with 2+ DTRs and 2+ pulses.  Lymph: No lymphadenopathy of posterior or anterior cervical chain or axillae bilaterally.  Gait normal with good balance and coordination.  MSK:  Non tender with full range of motion and good stability and symmetric strength and tone of shoulders, elbows, wrist, hip, knee and ankles bilaterally.  Neck: Inspection unremarkable. No palpable stepoffs. Spurling's maneuver. Full neck range of motion Grip strength and sensation normal in bilateral hands Strength good C4 to T1 distribution, somewhat difficult to assess because patient is not being compliant No sensory change to C4 to T1 Negative Hoffman sign bilaterally Reflexes normal Patient has multiple places of tenderness along the medial aspect of the scapula bilaterally  Shoulder: left Inspection reveals no abnormalities, atrophy or asymmetry. Palpation is normal with no tenderness over AC joint or bicipital groove. ROM is full in all planes passively. Rotator cuff strength normal throughout. signs of impingement with positive Neer and Hawkin's tests, but negative empty can sign. Speeds and Yergason's tests normal. No labral pathology noted with negative Obrien's, negative clunk and good stability. Normal scapular function observed. No painful arc and no drop arm sign. No apprehension sign Mild improvement from previous exam.       Impression and Recommendations:     This case required medical decision making of  moderate complexity.

## 2014-08-29 NOTE — Assessment & Plan Note (Signed)
Referred to chronic pain management.

## 2014-08-31 ENCOUNTER — Telehealth: Payer: Self-pay | Admitting: Internal Medicine

## 2014-08-31 DIAGNOSIS — G894 Chronic pain syndrome: Secondary | ICD-10-CM

## 2014-08-31 NOTE — Telephone Encounter (Signed)
Referral entered  

## 2014-08-31 NOTE — Telephone Encounter (Signed)
Pt called in and stated that she would like a referral to a pain management Clinic    [facility] pain management  Fax- [phone]

## 2014-09-03 ENCOUNTER — Telehealth: Payer: Self-pay | Admitting: Internal Medicine

## 2014-09-03 NOTE — Telephone Encounter (Signed)
Patient states that she is having symptoms of depression returning. She wants to know if we want to change/ increase meds.

## 2014-09-04 ENCOUNTER — Ambulatory Visit (INDEPENDENT_AMBULATORY_CARE_PROVIDER_SITE_OTHER): Payer: Medicare Other | Admitting: Internal Medicine

## 2014-09-04 ENCOUNTER — Telehealth: Payer: Self-pay

## 2014-09-04 ENCOUNTER — Encounter: Payer: Self-pay | Admitting: Internal Medicine

## 2014-09-04 VITALS — BP 132/86 | HR 94 | Temp 98.5°F | Resp 18 | Ht 62.0 in | Wt 155.1 lb

## 2014-09-04 DIAGNOSIS — J018 Other acute sinusitis: Secondary | ICD-10-CM

## 2014-09-04 DIAGNOSIS — F329 Major depressive disorder, single episode, unspecified: Secondary | ICD-10-CM

## 2014-09-04 DIAGNOSIS — J309 Allergic rhinitis, unspecified: Secondary | ICD-10-CM

## 2014-09-04 DIAGNOSIS — F32A Depression, unspecified: Secondary | ICD-10-CM

## 2014-09-04 MED ORDER — FLUOXETINE HCL 20 MG PO TABS: 20.0000 mg | ORAL_TABLET | Freq: Every day | ORAL | Status: DC

## 2014-09-04 MED ORDER — AZITHROMYCIN 250 MG PO TABS: ORAL_TABLET | ORAL | Status: DC

## 2014-09-04 NOTE — Telephone Encounter (Signed)
Please make ROV, or I can refer to counseling if she thinks might help

## 2014-09-04 NOTE — Patient Instructions (Addendum)
Please take all new medication as prescribed - the antibiotic and the prozac  OK to stop the celexa  Please continue all other medications as before, and refills have been done if requested.  Please have the pharmacy call with any other refills you may need.  Please continue your efforts at being more active, low cholesterol diet, and weight control.  Please keep your appointments with your specialists as you may have planned  Please return in 6 months, or sooner if needed

## 2014-09-04 NOTE — Assessment & Plan Note (Signed)
For flonase re-start,  to f/u any worsening symptoms or concerns  

## 2014-09-04 NOTE — Progress Notes (Signed)
Subjective:    Patient ID: Laura Barnes, female    DOB: 09-Sep-1940, 74 y.o.   MRN: [id number]  HPI   Here with 2-3 days acute onset fever, facial pain, pressure, headache, general weakness and malaise, and greenish d/c, with mild ST and cough, but pt denies chest pain, wheezing, increased sob or doe, orthopnea, PND, increased LE swelling, palpitations, dizziness or syncope. Also with worsening depression recently with low mood, anhedonia, despite good med compliance. No SI or HI. Pt denies new neurological symptoms such as new headache, or facial or extremity weakness or numbness   Pt denies polydipsia, polyuria. Has gained some wt recent with less active  Does have several wks ongoing nasal allergy symptoms with clearish congestion, itch and sneezing, without fever, pain, ST, cough, swelling Wt Readings from Last 3 Encounters:  09/04/14 155 lb 1.3 oz (70.344 kg)  08/29/14 155 lb (70.308 kg)  08/01/14 156 lb (70.761 kg)   Past Medical History  Diagnosis Date  . HYPERLIPIDEMIA   . ANXIETY   . DEPRESSION   . MIGRAINE, COMMON   . MIGRAINE HEADACHE   . VENOUS INSUFFICIENCY, CHRONIC   . SINUSITIS- ACUTE-NOS   . ALLERGIC RHINITIS   . GERD   . IBS   . VAGINITIS   . SKIN LESION   . Enthesopathy of hip region   . FOOT PAIN, BILATERAL   . PLANTAR FASCIITIS   . OSTEOPOROSIS   . OSTEOPENIA   . RASH-NONVESICULAR   . NECK MASS   . Hiatal hernia   . Hypothyroidism   . Bronchitis   . Anemia   . Complication of anesthesia     says one time waking up she couldn't breathe, felt like her throat closing up  . Family history of anesthesia complication     sister with n/v   Past Surgical History  Procedure Laterality Date  . Appendectomy    . Cholecystectomy    . 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 Oophorectomy    . Shoulder surgery    . S/p neck lump removal    . Breast enhancement surgery    . Vaginal hysterectomy    . Tonsillectomy    .  Colonoscopy    . Sinus endo w/fusion Right 06/30/2013    Procedure: RIGHT ENDOSCOPIC SPHENOIDECTOMY WITH FUSION SCAN;  Surgeon: [name], MD;  Location: [facility];  Service: ENT;  Laterality: Right;    reports that she has never smoked. She has never used smokeless tobacco. She reports that she does not drink alcohol or use illicit drugs. family history includes Breast cancer in her maternal grandmother; Diabetes in her mother, other, and sister; Heart disease in her father, maternal aunt, and maternal uncle; Kidney disease in her paternal uncle. There is no history of Colon cancer, Colon polyps, Rectal cancer, or Stomach cancer. Allergies  Allergen Reactions  . Prednisone Anaphylaxis    Patient unable to remember why she needed to steroid shot but just knows that when she got back to work she started having a lot of trouble breathing.  (jkl 05/04/14)  . Aspirin Other (See Comments)    Stomach ache and bleed  . Erythromycin Diarrhea  . Latex Other (See Comments)    blisters  . Levofloxacin Other (See Comments)    Headaches, GI upset  . Nsaids Other (See Comments)    stomach bleeding per pt  . Statins Nausea Only and Other (See Comments)  Headache, upset stomach, joint hurt, heartburn  . Sumatriptan Hives and Palpitations  . Adhesive [Tape] Other (See Comments)    blisters  . Amoxicillin-Pot Clavulanate   . Doxycycline   . Methylphenidate Hcl   . Mirtazapine   . Hydrocodone Itching  . Hydrocodone-Acetaminophen Itching  . Rofecoxib Nausea Only  . Sertraline Hcl Nausea Only  . Sulfonamide Derivatives Itching   Current Outpatient Prescriptions on File Prior to Visit  Medication Sig Dispense Refill  . ALPRAZolam (XANAX) 0.5 MG tablet TAKE 1 TABLET BY MOUTH TWICE DAILY AS NEEDED 60 tablet 2  . B Complex-Biotin-FA (B COMPLETE) TABS Take 1 tablet by mouth daily. B Complete 100mg     . baclofen (LIORESAL) 10 MG tablet TAKE 1 TABLET BY MOUTH THREE TIMES DAILY 90 tablet 2  . buPROPion  (WELLBUTRIN XL) 300 MG 24 hr tablet TAKE 1 TABLET (300 MG TOTAL) BY MOUTH DAILY. 90 tablet 3  . calcium carbonate (OS-CAL) 600 MG TABS tablet Take 600 mg by mouth 2 (two) times daily with a meal.     . cetirizine (ZYRTEC) 10 MG tablet TAKE 1 TABLET (10 MG TOTAL) BY MOUTH DAILY. 30 tablet 11  . cholecalciferol (VITAMIN D) 1000 UNITS tablet Take 1,000 Units by mouth daily.    . clidinium-chlordiazePOXIDE (LIBRAX) 5-2.5 MG per capsule Take 1 capsule by mouth daily as needed (for stomach).    . Coenzyme Q10 (CO Q-10) 200 MG CAPS Take 1 capsule by mouth 2 (two) times daily.    Marland Kitchen esomeprazole (NEXIUM) 40 MG capsule Take 1 capsule (40 mg total) by mouth 2 (two) times daily before a meal. 60 capsule 11  . estradiol (ESTRACE) 1 MG tablet Take 1 tablet (1 mg total) by mouth daily. 90 tablet 3  . fluticasone (FLONASE) 50 MCG/ACT nasal spray Place 2 sprays into both nostrils daily. 16 g 2  . levothyroxine (SYNTHROID, LEVOTHROID) 25 MCG tablet Take 1 tablet (25 mcg total) by mouth daily before breakfast. 90 tablet 3  . Meperidine HCl (DEMEROL PO) Take by mouth. Takes for headaches unsure dose    . Multiple Vitamin (MULTIVITAMIN WITH MINERALS) TABS tablet Take 1 tablet by mouth daily.    . Omega-3 Fatty Acids (SALMON OIL-1000 PO) Take 1,000 mg by mouth daily.    . potassium chloride (KLOR-CON 10) 10 MEQ tablet Take 1 tablet (10 mEq total) by mouth daily. 90 tablet 2  . prochlorperazine (COMPAZINE) 10 MG tablet   0  . topiramate (TOPAMAX) 50 MG tablet Take 50-250 mg by mouth 2 (two) times daily. Take 50mg  (1 tablet) daily in the morning.  Take 250mg  (5 tablets) daily at bedtime    . traMADol (ULTRAM) 50 MG tablet 1-2 tabs by mouth three times daily as needed for pain 180 tablet 5  . triamcinolone cream (KENALOG) 0.5 % Apply topically 2 (two) times daily. As needed for rash 30 g 0  . triamterene-hydrochlorothiazide (MAXZIDE-25) 37.5-25 MG per tablet Take 0.5 tablets by mouth daily. 30 tablet 11   No current  facility-administered medications on file prior to visit.    Review of Systems  Constitutional: Negative for unusual diaphoresis or night sweats HENT: Negative for ringing in ear or discharge Eyes: Negative for double vision or worsening visual disturbance.  Respiratory: Negative for choking and stridor.   Gastrointestinal: Negative for vomiting or other signifcant bowel change Genitourinary: Negative for hematuria or change in urine volume.  Musculoskeletal: Negative for other MSK pain or swelling Skin: Negative for color change and worsening  wound.  Neurological: Negative for tremors and numbness other than noted  Psychiatric/Behavioral: Negative for decreased concentration or agitation other than above       Objective:   Physical Exam BP 132/86 mmHg  Pulse 94  Temp(Src) 98.5 F (36.9 C) (Oral)  Resp 18  Ht 5\' 2"  (1.575 m)  Wt 155 lb 1.3 oz (70.344 kg)  BMI 28.36 kg/m2  SpO2 96% VS noted, mild ill Constitutional: Pt appears in no significant distress HENT: Head: NCAT.  Right Ear: External ear normal.  Left Ear: External ear normal.  Bilat tm's with mild erythema.  Max sinus areas mild tender.  Pharynx with mild erythema, no exudate Eyes: . Pupils are equal, round, and reactive to light. Conjunctivae and EOM are normal Neck: Normal range of motion. Neck supple.  Cardiovascular: Normal rate and regular rhythm.   Pulmonary/Chest: Effort normal and breath sounds without rales or wheezing.  Abd:  Soft, NT, ND, + BS Neurological: Pt is alert. Not confused , motor grossly intact Skin: Skin is warm. No rash, no LE edema Psychiatric: Pt behavior is normal. No agitation. _+ depressed mood      Assessment & Plan:

## 2014-09-04 NOTE — Assessment & Plan Note (Signed)
Ok for change celexa to prozac 20 qd, consider increase to 40 in 3 wks, cont all other meds, consider psychiatry referral

## 2014-09-04 NOTE — Assessment & Plan Note (Signed)
Mild to mod, for antibx course,  to f/u any worsening symptoms or concerns 

## 2014-09-04 NOTE — Telephone Encounter (Signed)
Left message for pt to call back to schedule office visit or to be referred by dr Jenny Reichmann for counseling if she prefers counseling, pt to call to let us know what she prefers to do

## 2014-09-04 NOTE — Progress Notes (Signed)
Pre visit review using our clinic review tool, if applicable. No additional management support is needed unless otherwise documented below in the visit note. 

## 2014-09-05 ENCOUNTER — Telehealth: Payer: Self-pay | Admitting: Internal Medicine

## 2014-09-05 MED ORDER — FLUOXETINE HCL 20 MG PO CAPS: 20.0000 mg | ORAL_CAPSULE | Freq: Every day | ORAL | Status: DC

## 2014-09-05 NOTE — Telephone Encounter (Signed)
Pt called in and needs the script for FLUoxetine (PROZAC) 20 MG tablet [[id number] need to be capsule instead of Tablets and needs to be called into [facility] on [address].

## 2014-09-05 NOTE — Telephone Encounter (Signed)
Pt came in on yesterday concerning sxs...[facility]

## 2014-09-05 NOTE — Telephone Encounter (Signed)
Sent capsule to [facility]...L[facility] Barnes

## 2014-09-19 ENCOUNTER — Ambulatory Visit (INDEPENDENT_AMBULATORY_CARE_PROVIDER_SITE_OTHER)
Admission: RE | Admit: 2014-09-19 | Discharge: 2014-09-19 | Disposition: A | Discharge status: Home / Self Care | Payer: Medicare Other | Source: Ambulatory Visit | Attending: Internal Medicine | Admitting: Internal Medicine

## 2014-09-19 ENCOUNTER — Ambulatory Visit (INDEPENDENT_AMBULATORY_CARE_PROVIDER_SITE_OTHER): Payer: Medicare Other | Admitting: Internal Medicine

## 2014-09-19 ENCOUNTER — Encounter: Payer: Self-pay | Admitting: Internal Medicine

## 2014-09-19 VITALS — BP 128/84 | HR 110 | Temp 98.9°F | Resp 18 | Ht 62.0 in | Wt 150.1 lb

## 2014-09-19 DIAGNOSIS — R059 Cough, unspecified: Secondary | ICD-10-CM

## 2014-09-19 DIAGNOSIS — R05 Cough: Secondary | ICD-10-CM

## 2014-09-19 DIAGNOSIS — G894 Chronic pain syndrome: Secondary | ICD-10-CM | POA: Diagnosis not present

## 2014-09-19 DIAGNOSIS — J309 Allergic rhinitis, unspecified: Secondary | ICD-10-CM

## 2014-09-19 MED ORDER — METHYLPREDNISOLONE ACETATE 80 MG/ML IJ SUSP
80.0000 mg | Freq: Once | INTRAMUSCULAR | Status: AC
  Administered 2014-09-19: 80 mg via INTRAMUSCULAR

## 2014-09-19 MED ORDER — CILIDINIUM-CHLORDIAZEPOXIDE 2.5-5 MG PO CAPS: 1.0000 | ORAL_CAPSULE | Freq: Every day | ORAL | Status: DC | PRN

## 2014-09-19 MED ORDER — METHYLPREDNISOLONE 4 MG PO TBPK: ORAL_TABLET | ORAL | Status: DC

## 2014-09-19 MED ORDER — TRIAMTERENE-HCTZ 37.5-25 MG PO TABS: 1.0000 | ORAL_TABLET | Freq: Every day | ORAL | Status: DC

## 2014-09-19 NOTE — Progress Notes (Signed)
Subjective:    Patient ID: Laura Barnes, female    DOB: Sep 15, 1940, 74 y.o.   MRN: [id number]  HPI  Here with ongoing cough for at least 2 mo worse than usual chronic minor cough previously.  Now with soreness to chest and stomach, now worse with sneezing on top of that.  Cough leads to choking and near vomiting.  Scant productive at most, no color, no obvious fever, Has mild ST occas, no sinus congestion or pain or ear complaints.  No wheezing, but has some sob with movement after sitting for a while.  No recent travel or unusual pets. + hx of allergies, taking antihist and flonase dialy. No sick contacts  Also with nausea she believes related to FMS pain. Lsot 5 lbs since last visit apro 5. Has early satiety, just fills up fast/  Denies worsening reflux, abd pain, dysphagia,  bowel change or blood.  Did not do cxr ordered in nov 2015 Past Medical History  Diagnosis Date  . HYPERLIPIDEMIA   . ANXIETY   . DEPRESSION   . MIGRAINE, COMMON   . MIGRAINE HEADACHE   . VENOUS INSUFFICIENCY, CHRONIC   . SINUSITIS- ACUTE-NOS   . ALLERGIC RHINITIS   . GERD   . IBS   . VAGINITIS   . SKIN LESION   . Enthesopathy of hip region   . FOOT PAIN, BILATERAL   . PLANTAR FASCIITIS   . OSTEOPOROSIS   . OSTEOPENIA   . RASH-NONVESICULAR   . NECK MASS   . Hiatal hernia   . Hypothyroidism   . Bronchitis   . Anemia   . Complication of anesthesia     says one time waking up she couldn't breathe, felt like her throat closing up  . Family history of anesthesia complication     sister with n/v   Past Surgical History  Procedure Laterality Date  . Appendectomy    . Cholecystectomy    . 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 Oophorectomy    . Shoulder surgery    . S/p neck lump removal    . Breast enhancement surgery    . Vaginal hysterectomy    . Tonsillectomy    . Colonoscopy    . Sinus endo w/fusion Right 06/30/2013    Procedure: RIGHT ENDOSCOPIC  SPHENOIDECTOMY WITH FUSION SCAN;  Surgeon: [name], MD;  Location: [facility];  Service: ENT;  Laterality: Right;    reports that she has never smoked. She has never used smokeless tobacco. She reports that she does not drink alcohol or use illicit drugs. family history includes Breast cancer in her maternal grandmother; Diabetes in her mother, other, and sister; Heart disease in her father, maternal aunt, and maternal uncle; Kidney disease in her paternal uncle. There is no history of Colon cancer, Colon polyps, Rectal cancer, or Stomach cancer. Allergies  Allergen Reactions  . Prednisone Anaphylaxis    Patient unable to remember why she needed to steroid shot but just knows that when she got back to work she started having a lot of trouble breathing.  (jkl 05/04/14)  . Aspirin Other (See Comments)    Stomach ache and bleed  . Erythromycin Diarrhea  . Latex Other (See Comments)    blisters  . Levofloxacin Other (See Comments)    Headaches, GI upset  . Nsaids Other (See Comments)    stomach bleeding per pt  . Statins Nausea Only and Other (See Comments)  Headache, upset stomach, joint hurt, heartburn  . Sumatriptan Hives and Palpitations  . Adhesive [Tape] Other (See Comments)    blisters  . Amoxicillin-Pot Clavulanate   . Doxycycline   . Methylphenidate Hcl   . Mirtazapine   . Hydrocodone Itching  . Hydrocodone-Acetaminophen Itching  . Rofecoxib Nausea Only  . Sertraline Hcl Nausea Only  . Sulfonamide Derivatives Itching   Current Outpatient Prescriptions on File Prior to Visit  Medication Sig Dispense Refill  . ALPRAZolam (XANAX) 0.5 MG tablet TAKE 1 TABLET BY MOUTH TWICE DAILY AS NEEDED 60 tablet 2  . B Complex-Biotin-FA (B COMPLETE) TABS Take 1 tablet by mouth daily. B Complete 100mg     . baclofen (LIORESAL) 10 MG tablet TAKE 1 TABLET BY MOUTH THREE TIMES DAILY 90 tablet 2  . buPROPion (WELLBUTRIN XL) 300 MG 24 hr tablet TAKE 1 TABLET (300 MG TOTAL) BY MOUTH DAILY. 90  tablet 3  . calcium carbonate (OS-CAL) 600 MG TABS tablet Take 600 mg by mouth 2 (two) times daily with a meal.     . cetirizine (ZYRTEC) 10 MG tablet TAKE 1 TABLET (10 MG TOTAL) BY MOUTH DAILY. 30 tablet 11  . cholecalciferol (VITAMIN D) 1000 UNITS tablet Take 1,000 Units by mouth daily.    . Coenzyme Q10 (CO Q-10) 200 MG CAPS Take 1 capsule by mouth 2 (two) times daily.    Marland Kitchen esomeprazole (NEXIUM) 40 MG capsule Take 1 capsule (40 mg total) by mouth 2 (two) times daily before a meal. 60 capsule 11  . estradiol (ESTRACE) 1 MG tablet Take 1 tablet (1 mg total) by mouth daily. 90 tablet 3  . FLUoxetine (PROZAC) 20 MG capsule Take 1 capsule (20 mg total) by mouth daily. 90 capsule 3  . fluticasone (FLONASE) 50 MCG/ACT nasal spray Place 2 sprays into both nostrils daily. 16 g 2  . levothyroxine (SYNTHROID, LEVOTHROID) 25 MCG tablet Take 1 tablet (25 mcg total) by mouth daily before breakfast. 90 tablet 3  . Meperidine HCl (DEMEROL PO) Take by mouth as needed. Takes for headaches unsure dose    . Multiple Vitamin (MULTIVITAMIN WITH MINERALS) TABS tablet Take 1 tablet by mouth daily.    . Omega-3 Fatty Acids (SALMON OIL-1000 PO) Take 1,000 mg by mouth daily.    . potassium chloride (KLOR-CON 10) 10 MEQ tablet Take 1 tablet (10 mEq total) by mouth daily. 90 tablet 2  . prochlorperazine (COMPAZINE) 10 MG tablet   0  . topiramate (TOPAMAX) 50 MG tablet Take 50-250 mg by mouth 2 (two) times daily. Take 50mg  (1 tablet) daily in the morning.  Take 250mg  (5 tablets) daily at bedtime    . traMADol (ULTRAM) 50 MG tablet 1-2 tabs by mouth three times daily as needed for pain 180 tablet 5  . triamcinolone cream (KENALOG) 0.5 % Apply topically 2 (two) times daily. As needed for rash 30 g 0  . TURMERIC PO Take by mouth.     No current facility-administered medications on file prior to visit.   Review of Systems  Constitutional: Negative for unusual diaphoresis or night sweats HENT: Negative for ringing in ear or  discharge Eyes: Negative for double vision or worsening visual disturbance.  Respiratory: Negative for choking and stridor.   Gastrointestinal: Negative for vomiting or other signifcant bowel change Genitourinary: Negative for hematuria or change in urine volume.  Musculoskeletal: Negative for other MSK pain or swelling Skin: Negative for color change and worsening wound.  Neurological: Negative for tremors and numbness  other than noted  Psychiatric/Behavioral: Negative for decreased concentration or agitation other than above       Objective:   Physical Exam BP 128/84 mmHg  Pulse 110  Temp(Src) 98.9 F (37.2 C) (Oral)  Resp 18  Ht 5\' 2"  (1.575 m)  Wt 150 lb 1.9 oz (68.094 kg)  BMI 27.45 kg/m2  SpO2 97% VS noted,  Constitutional: Pt appears in no significant distress HENT: Head: NCAT.  Right Ear: External ear normal.  Left Ear: External ear normal.  Bilat tm's with mild erythema.  Max sinus areas non tender.  Pharynx with mild erythema, no exudate Eyes: . Pupils are equal, round, and reactive to light. Conjunctivae and EOM are normal Neck: Normal range of motion. Neck supple.  Cardiovascular: Normal rate and regular rhythm.   Pulmonary/Chest: Effort normal and breath sounds without rales or wheezing.  Abd:  Soft, NT, ND, + BS Neurological: Pt is alert. Not confused , motor grossly intact Skin: Skin is warm. No rash, no LE edema Psychiatric: Pt behavior is normal. No agitation.     Assessment & Plan:

## 2014-09-19 NOTE — Patient Instructions (Signed)
You had the steroid shot today  Please take all new medication as prescribed - the medrol  Please go to the XRAY Department in the Basement (go straight as you get off the elevator) for the x-ray testing  You will be contacted by phone if any changes need to be made immediately.  Otherwise, you will receive a letter about your results with an explanation, but please check with MyChart first.  Please remember to sign up for MyChart if you have not done so, as this will be important to you in the future with finding out test results, communicating by private email, and scheduling acute appointments online when needed.  Please continue all other medications as before, and refills have been done if requested.  Please have the pharmacy call with any other refills you may need.  Please keep your appointments with your specialists as you may have planned

## 2014-09-19 NOTE — Progress Notes (Signed)
Pre visit review using our clinic review tool, if applicable. No additional management support is needed unless otherwise documented below in the visit note. 

## 2014-09-19 NOTE — Assessment & Plan Note (Signed)
To cont antihist, flonase, also for depomedrol, medrol pac asd

## 2014-09-19 NOTE — Assessment & Plan Note (Signed)
D/w pt, ok to cont current tx, also tylenol prn,  to f/u any worsening symptoms or concerns

## 2014-09-19 NOTE — Assessment & Plan Note (Addendum)
?   Related to uncontrolled allergies - for tx as above, also cxr, consider pulm referral, consider PPI trial

## 2014-09-24 ENCOUNTER — Other Ambulatory Visit: Payer: Self-pay | Admitting: Internal Medicine

## 2014-09-25 ENCOUNTER — Other Ambulatory Visit: Payer: Self-pay | Admitting: Family Medicine

## 2014-09-25 NOTE — Telephone Encounter (Signed)
Refill done.  

## 2014-10-17 ENCOUNTER — Ambulatory Visit (INDEPENDENT_AMBULATORY_CARE_PROVIDER_SITE_OTHER): Payer: Medicare Other | Admitting: Internal Medicine

## 2014-10-17 ENCOUNTER — Encounter: Payer: Self-pay | Admitting: Internal Medicine

## 2014-10-17 VITALS — BP 130/72 | HR 91 | Temp 98.5°F | Wt 150.0 lb

## 2014-10-17 DIAGNOSIS — G894 Chronic pain syndrome: Secondary | ICD-10-CM

## 2014-10-17 DIAGNOSIS — R05 Cough: Secondary | ICD-10-CM

## 2014-10-17 DIAGNOSIS — R059 Cough, unspecified: Secondary | ICD-10-CM

## 2014-10-17 DIAGNOSIS — M797 Fibromyalgia: Secondary | ICD-10-CM

## 2014-10-17 DIAGNOSIS — G43809 Other migraine, not intractable, without status migrainosus: Secondary | ICD-10-CM

## 2014-10-17 DIAGNOSIS — J309 Allergic rhinitis, unspecified: Secondary | ICD-10-CM

## 2014-10-17 MED ORDER — ALBUTEROL SULFATE HFA 108 (90 BASE) MCG/ACT IN AERS: 2.0000 | INHALATION_SPRAY | Freq: Four times a day (QID) | RESPIRATORY_TRACT | Status: DC | PRN

## 2014-10-17 MED ORDER — ONDANSETRON HCL 4 MG PO TABS: 4.0000 mg | ORAL_TABLET | Freq: Three times a day (TID) | ORAL | Status: DC | PRN

## 2014-10-17 NOTE — Progress Notes (Signed)
Subjective:    Patient ID: Laura Barnes, female    DOB: 09/17/1940, 74 y.o.   MRN: [id number]  HPI  Here with c/o flare of FMS with "all over pain", not better with oral demerol for more than a few minutes, assoc with bifrontal HA and nausea, makes her lie in bed all day, and in particular nausea day long today, feels like might throw up.  Denies worsening reflux, abd pain, dysphagia,, bowel change or blood., but compazine not working.  Pt denies chest pain, increased sob or doe, wheezing, orthopnea, PND, increased LE swelling, palpitations, dizziness or syncope.  /Pt denies new neurological symptoms such as new headache, or facial or extremity weakness or numbness   Still waiting on pain management referral.  Has hx of demerol oral use in the past years. Past Medical History  Diagnosis Date  . HYPERLIPIDEMIA   . ANXIETY   . DEPRESSION   . MIGRAINE, COMMON   . MIGRAINE HEADACHE   . VENOUS INSUFFICIENCY, CHRONIC   . SINUSITIS- ACUTE-NOS   . ALLERGIC RHINITIS   . GERD   . IBS   . VAGINITIS   . SKIN LESION   . Enthesopathy of hip region   . FOOT PAIN, BILATERAL   . PLANTAR FASCIITIS   . OSTEOPOROSIS   . OSTEOPENIA   . RASH-NONVESICULAR   . NECK MASS   . Hiatal hernia   . Hypothyroidism   . Bronchitis   . Anemia   . Complication of anesthesia     says one time waking up she couldn't breathe, felt like her throat closing up  . Family history of anesthesia complication     sister with n/v   Past Surgical History  Procedure Laterality Date  . Appendectomy    . Cholecystectomy    . 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 Oophorectomy    . Shoulder surgery    . S/p neck lump removal    . Breast enhancement surgery    . Vaginal hysterectomy    . Tonsillectomy    . Colonoscopy    . Sinus endo w/fusion Right 06/30/2013    Procedure: RIGHT ENDOSCOPIC SPHENOIDECTOMY WITH FUSION SCAN;  Surgeon: [name], MD;  Location: [facility];  Service:  ENT;  Laterality: Right;    reports that she has never smoked. She has never used smokeless tobacco. She reports that she does not drink alcohol or use illicit drugs. family history includes Breast cancer in her maternal grandmother; Diabetes in her mother, other, and sister; Heart disease in her father, maternal aunt, and maternal uncle; Kidney disease in her paternal uncle. There is no history of Colon cancer, Colon polyps, Rectal cancer, or Stomach cancer. Allergies  Allergen Reactions  . Prednisone Anaphylaxis    Patient unable to remember why she needed to steroid shot but just knows that when she got back to work she started having a lot of trouble breathing.  (jkl 05/04/14)  . Aspirin Other (See Comments)    Stomach ache and bleed  . Erythromycin Diarrhea  . Latex Other (See Comments)    blisters  . Levofloxacin Other (See Comments)    Headaches, GI upset  . Nsaids Other (See Comments)    stomach bleeding per pt  . Statins Nausea Only and Other (See Comments)    Headache, upset stomach, joint hurt, heartburn  . Sumatriptan Hives and Palpitations  . Adhesive [Tape] Other (See Comments)  blisters  . Amoxicillin-Pot Clavulanate   . Doxycycline   . Methylphenidate Hcl   . Mirtazapine   . Hydrocodone Itching  . Hydrocodone-Acetaminophen Itching  . Rofecoxib Nausea Only  . Sertraline Hcl Nausea Only  . Sulfonamide Derivatives Itching   Current Outpatient Prescriptions on File Prior to Visit  Medication Sig Dispense Refill  . ALPRAZolam (XANAX) 0.5 MG tablet TAKE 1 TABLET BY MOUTH TWICE DAILY AS NEEDED 60 tablet 2  . B Complex-Biotin-FA (B COMPLETE) TABS Take 1 tablet by mouth daily. B Complete 100mg     . baclofen (LIORESAL) 10 MG tablet TAKE 1 TABLET BY MOUTH THREE TIMES DAILY 90 tablet 0  . buPROPion (WELLBUTRIN XL) 300 MG 24 hr tablet TAKE 1 TABLET (300 MG TOTAL) BY MOUTH DAILY. 90 tablet 3  . calcium carbonate (OS-CAL) 600 MG TABS tablet Take 600 mg by mouth 2 (two) times  daily with a meal.     . cetirizine (ZYRTEC) 10 MG tablet TAKE 1 TABLET (10 MG TOTAL) BY MOUTH DAILY. 30 tablet 11  . cholecalciferol (VITAMIN D) 1000 UNITS tablet Take 1,000 Units by mouth daily.    . clidinium-chlordiazePOXIDE (LIBRAX) 5-2.5 MG per capsule Take 1 capsule by mouth daily as needed (for stomach). 60 capsule 4  . Coenzyme Q10 (CO Q-10) 200 MG CAPS Take 1 capsule by mouth 2 (two) times daily.    Marland Kitchen esomeprazole (NEXIUM) 40 MG capsule Take 1 capsule (40 mg total) by mouth 2 (two) times daily before a meal. 60 capsule 11  . estradiol (ESTRACE) 1 MG tablet Take 1 tablet (1 mg total) by mouth daily. 90 tablet 3  . FLUoxetine (PROZAC) 20 MG capsule Take 1 capsule (20 mg total) by mouth daily. 90 capsule 3  . fluticasone (FLONASE) 50 MCG/ACT nasal spray Place 2 sprays into both nostrils daily. 16 g 2  . levothyroxine (SYNTHROID, LEVOTHROID) 25 MCG tablet Take 1 tablet (25 mcg total) by mouth daily before breakfast. 90 tablet 3  . Meperidine HCl (DEMEROL PO) Take by mouth as needed. Takes for headaches unsure dose    . methylPREDNISolone (MEDROL DOSEPAK) 4 MG TBPK tablet Use as directed 21 tablet 0  . Multiple Vitamin (MULTIVITAMIN WITH MINERALS) TABS tablet Take 1 tablet by mouth daily.    . Omega-3 Fatty Acids (SALMON OIL-1000 PO) Take 1,000 mg by mouth daily.    . potassium chloride (KLOR-CON 10) 10 MEQ tablet Take 1 tablet (10 mEq total) by mouth daily. 90 tablet 2  . prochlorperazine (COMPAZINE) 10 MG tablet   0  . topiramate (TOPAMAX) 50 MG tablet Take 50-250 mg by mouth 2 (two) times daily. Take 50mg  (1 tablet) daily in the morning.  Take 250mg  (5 tablets) daily at bedtime    . traMADol (ULTRAM) 50 MG tablet 1-2 tabs by mouth three times daily as needed for pain 180 tablet 5  . triamcinolone cream (KENALOG) 0.5 % Apply topically 2 (two) times daily. As needed for rash 30 g 0  . triamterene-hydrochlorothiazide (MAXZIDE-25) 37.5-25 MG per tablet Take 1 tablet by mouth daily. 30 tablet  11  . TURMERIC PO Take by mouth.     No current facility-administered medications on file prior to visit.   Review of Systems  Constitutional: Negative for unusual diaphoresis or night sweats HENT: Negative for ringing in ear or discharge Eyes: Negative for double vision or worsening visual disturbance.  Respiratory: Negative for choking and stridor.   Gastrointestinal: Negative for vomiting or other signifcant bowel change Genitourinary: Negative  for hematuria or change in urine volume.  Musculoskeletal: Negative for other MSK pain or swelling Skin: Negative for color change and worsening wound.  Neurological: Negative for tremors and numbness other than noted  Psychiatric/Behavioral: Negative for decreased concentration or agitation other than above       Objective:   Physical Exam BP 130/72 mmHg  Pulse 91  Temp(Src) 98.5 F (36.9 C) (Oral)  Wt 150 lb (68.04 kg)  SpO2 97% VS noted,  Constitutional: Pt appears in no significant distress HENT: Head: NCAT.  Right Ear: External ear normal.  Left Ear: External ear normal.  Eyes: . Pupils are equal, round, and reactive to light. Conjunctivae and EOM are normal Neck: Normal range of motion. Neck supple.  Cardiovascular: Normal rate and regular rhythm.   Pulmonary/Chest: Effort normal and breath sounds without rales or wheezing.  Abd:  Soft, NT, ND, + BS Neurological: Pt is alert. Not confused , motor grossly intact Skin: Skin is warm. No rash, no LE edema Psychiatric: Pt behavior is normal. No agitation.     Assessment & Plan:

## 2014-10-17 NOTE — Patient Instructions (Addendum)
Please take all new medication as prescribed - the zofran for nausea, and the inhaler to see if helps for cough  Please continue all other medications as before, and refills have been done if requested.  Please have the pharmacy call with any other refills you may need.  Please continue your efforts at being more active, low cholesterol diet, and weight control.  You will be contacted regarding the referral for: allergist, and pain management  Please keep your appointments with your specialists as you may have planned

## 2014-10-17 NOTE — Assessment & Plan Note (Signed)
Also for pain management referral per pt reqeust

## 2014-10-17 NOTE — Assessment & Plan Note (Signed)
Etiology unclear, ongoing x 5 yrs, recent cxr neg, exam benign, ok for Proair HFA trial for possible ? Cough variant asthma

## 2014-10-17 NOTE — Assessment & Plan Note (Signed)
unfort I have nothing else  To offer at this time, denies trial of cymbalta or lyrica or TCA, Please continue all other medications as before

## 2014-10-17 NOTE — Progress Notes (Signed)
Pre visit review using our clinic review tool, if applicable. No additional management support is needed unless otherwise documented below in the visit note. 

## 2014-10-17 NOTE — Assessment & Plan Note (Signed)
Compazine not working- ok for trial zofran prn,  to f/u any worsening symptoms or concerns

## 2014-10-17 NOTE — Assessment & Plan Note (Signed)
Uncontrolled, to cont meds, refer allergy - likely needs repeat allergy shots

## 2014-10-18 ENCOUNTER — Telehealth: Payer: Self-pay | Admitting: Internal Medicine

## 2014-10-29 ENCOUNTER — Other Ambulatory Visit: Payer: Self-pay | Admitting: Family Medicine

## 2014-10-30 NOTE — Telephone Encounter (Signed)
Refill done.  

## 2014-11-06 ENCOUNTER — Encounter: Payer: Self-pay | Admitting: Physical Medicine & Rehabilitation

## 2014-11-24 ENCOUNTER — Other Ambulatory Visit: Payer: Self-pay | Admitting: Internal Medicine

## 2014-11-28 NOTE — Telephone Encounter (Signed)
Rx faxed to pharmacy  

## 2014-11-28 NOTE — Telephone Encounter (Signed)
Done hardcopy to Dahlia  

## 2014-12-17 ENCOUNTER — Encounter: Payer: Medicare Other | Attending: Physical Medicine & Rehabilitation

## 2014-12-17 ENCOUNTER — Ambulatory Visit (HOSPITAL_BASED_OUTPATIENT_CLINIC_OR_DEPARTMENT_OTHER): Payer: Medicare Other | Admitting: Physical Medicine & Rehabilitation

## 2014-12-17 ENCOUNTER — Encounter: Payer: Self-pay | Admitting: Physical Medicine & Rehabilitation

## 2014-12-17 VITALS — BP 146/90 | HR 90 | Resp 14

## 2014-12-17 DIAGNOSIS — M791 Myalgia: Secondary | ICD-10-CM | POA: Diagnosis present

## 2014-12-17 DIAGNOSIS — M549 Dorsalgia, unspecified: Secondary | ICD-10-CM | POA: Diagnosis not present

## 2014-12-17 DIAGNOSIS — M810 Age-related osteoporosis without current pathological fracture: Secondary | ICD-10-CM | POA: Diagnosis not present

## 2014-12-17 DIAGNOSIS — G894 Chronic pain syndrome: Secondary | ICD-10-CM | POA: Diagnosis not present

## 2014-12-17 DIAGNOSIS — K589 Irritable bowel syndrome without diarrhea: Secondary | ICD-10-CM | POA: Insufficient documentation

## 2014-12-17 DIAGNOSIS — M47816 Spondylosis without myelopathy or radiculopathy, lumbar region: Secondary | ICD-10-CM | POA: Diagnosis not present

## 2014-12-17 DIAGNOSIS — K219 Gastro-esophageal reflux disease without esophagitis: Secondary | ICD-10-CM | POA: Diagnosis not present

## 2014-12-17 DIAGNOSIS — Z800 Family history of malignant neoplasm of digestive organs: Secondary | ICD-10-CM | POA: Insufficient documentation

## 2014-12-17 DIAGNOSIS — Z833 Family history of diabetes mellitus: Secondary | ICD-10-CM | POA: Insufficient documentation

## 2014-12-17 DIAGNOSIS — E785 Hyperlipidemia, unspecified: Secondary | ICD-10-CM | POA: Insufficient documentation

## 2014-12-17 DIAGNOSIS — M542 Cervicalgia: Secondary | ICD-10-CM | POA: Insufficient documentation

## 2014-12-17 DIAGNOSIS — Z8249 Family history of ischemic heart disease and other diseases of the circulatory system: Secondary | ICD-10-CM | POA: Insufficient documentation

## 2014-12-17 DIAGNOSIS — M797 Fibromyalgia: Secondary | ICD-10-CM

## 2014-12-17 DIAGNOSIS — M5030 Other cervical disc degeneration, unspecified cervical region: Secondary | ICD-10-CM | POA: Diagnosis not present

## 2014-12-17 NOTE — Progress Notes (Signed)
Subjective:    Patient ID: Laura Barnes, female    DOB: Aug 12, 1940, 74 y.o.   MRN: [id number] CC  Pain all over body HPI 1.5 - 2 yr hx of pain Involving the head the neck the back the hips and the knees and the shoulders. No history of trauma. Low back pain started about 3-4 years ago was seen by orthopedics Dr. Percell Miller and then was referred to Dr. Lorin Mercy. Dr. Lorin Mercy then referred the patient to Dr. Ernestina Patches who performed some type of lumbar injections.  Patient does not recall whether was a single injection or multiple injections at each visit.  In addition patient has seen her primary care physician for the multifocal pain in the areas noted above. She has been tried on Cymbalta which caused some type of reaction, gabapentin and Lyrica which were not helpful  Sees a neurologist, Dr. Melton Alar for her migraine headaches. He prescribes Demerol 7 or 8 tablets to last for 5 or 6 months.  Patient was referred to physical therapy at [place]. Patient could not continue going there because of financial concerns.  Patient gets some relief with tramadol however complains of constipation with this medication. She does have stool softeners that she takes on occasion.  Reviewed MRI which demonstrated right-sided L3-L4 synovial cyst that protrudes posteriorly. No foraminal or lateral recess stenosis. Pain Inventory Average Pain 10 Pain Right Now 3 My pain is constant, sharp, burning, stabbing, tingling and aching  In the last 24 hours, has pain interfered with the following? General activity 8 Relation with others 8 Enjoyment of life 8 What TIME of day is your pain at its worst? VARIES Sleep (in general) Poor  Pain is worse with: walking, bending, sitting, standing and some activites Pain improves with: rest, medication and injections Relief from Meds: 3  Mobility walk without assistance how many minutes can you walk? 5-10 ability to climb steps?  yes do you drive?   no  Function retired  Neuro/Psych weakness tingling trouble walking spasms depression anxiety  Prior Studies Any changes since last visit?  no CLINICAL DATA:  74 year old female with neck pain radiating to both shoulders. Initial encounter.   EXAM: MRI CERVICAL SPINE WITHOUT CONTRAST   TECHNIQUE: Multiplanar, multisequence MR imaging of the cervical spine was performed. No intravenous contrast was administered.   COMPARISON:  Cervical spine radiographs 09/13/2013. Brain MRI 11/21/2011.   FINDINGS: Chronic multilevel mild spondylolisthesis in the cervical spine. Stable vertebral height and alignment since April. Multilevel chronic degenerative endplate marrow signal changes. Mild marrow edema at the C4 and C5 levels appears to be degenerative in nature. No acute osseous abnormality identified.   Cervicomedullary junction is within normal limits. Spinal cord signal is within normal limits at all visualized levels.   Negative paraspinal soft tissues.   C2-C3:  Mild facet hypertrophy on the left.  No stenosis.   C3-C4: Trace anterolisthesis. Circumferential disc bulge. Moderate to severe facet hypertrophy bilaterally with trace facet joint fluid. Broad-based posterior component of disc. No spinal stenosis. Severe bilateral C4 foraminal stenosis.   C4-C5: Severe disc space loss. Right eccentric circumferential disc osteophyte complex. Moderate facet hypertrophy greater on the left. No spinal stenosis. Moderate left and severe right C5 foraminal stenosis.   C5-C6: Severe disc space loss. Circumferential disc osteophyte complex. No spinal stenosis. Mild to moderate bilateral C6 foraminal stenosis.   C6-C7: Severe disc space loss. Mild circumferential disc osteophyte complex. No spinal stenosis. No significant foraminal stenosis.   C7-T1: Severe facet  hypertrophy greater on the left. Trace facet joint fluid on that side. Mild ligament flavum hypertrophy. No spinal  stenosis. Mild if any C8 foraminal stenosis.   No upper thoracic spinal stenosis.   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 Advanced cervical facet degeneration at several levels in the setting of multilevel mild spondylolisthesis. No cervical spinal stenosis occurs, but there is up to severe neural foraminal stenosis at the bilateral C4, and right C5 nerve levels.    Clinical Data: Low back pain radiating into both legs, left greater than right, for approximately 1 year.    MRI LUMBAR SPINE WITHOUT CONTRAST    Technique:  Multiplanar and multiecho pulse sequences of the lumbar spine were obtained without intravenous contrast.    Comparison: None.    Findings: Vertebral body height and alignment are maintained.  No worrisome marrow lesion with some degenerative discogenic marrow signal change seen at L3-4 and L5-S1.  There is a fairly prominent Schmorl's node in the inferior endplate of L3.  Conus medullaris is normal in signal and position.  Imaged intra-abdominal contents are unremarkable.    The T11-12 and T12-L1 levels are imaged in the sagittal plane only and negative.    L1-2:  Negative.    L2-3:  Negative.    L3-4:  The patient has some facet degenerative disease with a facet joint effusion on the right.  Synovial cysts are seen off the posterior aspects of both facets.  The larger cyst is on the right measuring 1.3 cm AP by 1.1 cm transverse by 0.6 cm cranial-caudal. Neither cyst impacts the central canal or foramina.  Mild disc bulging and ligamentum flavum thickening are seen but the central canal and neural foramina remain open.    L4-5:  Facet degenerative change with small bilateral facet joint effusions are present.  Mild disc bulging is identified but the central canal and foramina remain open.    L5-S1:  The patient has a shallow central and left side disc protrusion.  The disc just contacts the descending left  S1 root in the lateral recess without compressing or displacing it.  The thecal sac and foramina are patent.    IMPRESSION:    1.  Shallow left paracentral lateral recess protrusion at L5-S1 just contacts the descending left S1 root without compressing or displacing it. 2.  Facet degenerative disease which appears worst at L3-4 and L4- 5.  Physicians involved in your care Any changes since last visit?  no   Family History  Problem Relation Age of Onset  . Diabetes Mother   . Diabetes Sister   . Breast cancer Maternal Grandmother   . Heart disease Maternal Uncle     x 2  . Diabetes Other     multi relatives on father side  . Heart disease Maternal Aunt   . Kidney disease Paternal Uncle     questionable  . Colon cancer Neg Hx   . Colon polyps Neg Hx   . Rectal cancer Neg Hx   . Stomach cancer Neg Hx   . Heart disease Father    History   Social History  . Marital Status: Married    Spouse Name: N/A  . Number of Children: 2  . Years of Education: N/A   Occupational History  . retired Press photographer    Social History Main Topics  . Smoking status: Never Smoker   . Smokeless tobacco: Never Used  . Alcohol Use: No  . Drug Use: No  .  Sexual Activity: Not on file   Other Topics Concern  . None   Social History Narrative   Past Surgical History  Procedure Laterality Date  . Appendectomy    . Cholecystectomy    . 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 Oophorectomy    . Shoulder surgery    . S/p neck lump removal    . Breast enhancement surgery    . Vaginal hysterectomy    . Tonsillectomy    . Colonoscopy    . Sinus endo w/fusion Right 06/30/2013    Procedure: RIGHT ENDOSCOPIC SPHENOIDECTOMY WITH FUSION SCAN;  Surgeon: [name], MD;  Location: [facility];  Service: ENT;  Laterality: Right;   Past Medical History  Diagnosis Date  . HYPERLIPIDEMIA   . ANXIETY   . DEPRESSION   . MIGRAINE, COMMON   . MIGRAINE HEADACHE   .  VENOUS INSUFFICIENCY, CHRONIC   . SINUSITIS- ACUTE-NOS   . ALLERGIC RHINITIS   . GERD   . IBS   . VAGINITIS   . SKIN LESION   . Enthesopathy of hip region   . FOOT PAIN, BILATERAL   . PLANTAR FASCIITIS   . OSTEOPOROSIS   . OSTEOPENIA   . RASH-NONVESICULAR   . NECK MASS   . Hiatal hernia   . Hypothyroidism   . Bronchitis   . Anemia   . Complication of anesthesia     says one time waking up she couldn't breathe, felt like her throat closing up  . Family history of anesthesia complication     sister with n/v   BP 146/90 mmHg  Pulse 90  Resp 14  SpO2 96%  Opioid Risk Score:   Fall Risk Score: High Fall Risk (>13 points)`1  Depression screen PHQ 2/9  Depression screen [facility] 2/9 12/17/2014 05/01/2014  Decreased Interest 0 0  Down, Depressed, Hopeless 0 0  PHQ - 2 Score 0 0  Altered sleeping 0 -  Tired, decreased energy 0 -  Change in appetite 0 -  Feeling bad or failure about yourself  0 -  Trouble concentrating 0 -  Moving slowly or fidgety/restless 0 -  Suicidal thoughts 0 -  PHQ-9 Score 0 -     Review of Systems  Eyes: Negative.   Respiratory: Negative.   Cardiovascular: Negative.   Gastrointestinal: Positive for constipation.  Endocrine: Negative.   Genitourinary: Negative.   Musculoskeletal: Positive for myalgias, back pain and arthralgias.  Skin: Negative.   Allergic/Immunologic: Negative.   Neurological: Positive for weakness and headaches.       Tingling, trouble walking, spasms  Hematological: Negative.   Psychiatric/Behavioral: Positive for dysphoric mood. The patient is nervous/anxious.        Objective:   Physical Exam  Constitutional: She is oriented to person, place, and time. She appears well-developed and well-nourished.  HENT:  Head: Normocephalic and atraumatic.  Eyes: Conjunctivae and EOM are normal. Pupils are equal, round, and reactive to light.  Neck: Normal range of motion.  Cardiovascular: Normal rate, regular rhythm, normal heart  sounds and intact distal pulses.   Pulmonary/Chest: Effort normal.  Abdominal: Soft.  Musculoskeletal:       Right shoulder: She exhibits decreased range of motion.       Left shoulder: She exhibits decreased range of motion and tenderness. She exhibits no deformity.       Right elbow: Normal.      Left elbow: Normal.  Right wrist: Normal.       Left wrist: Normal.       Right hip: She exhibits decreased range of motion and tenderness.       Left hip: She exhibits tenderness. She exhibits normal range of motion.       Right knee: She exhibits decreased range of motion. She exhibits no effusion and no deformity.       Left knee: She exhibits decreased range of motion. She exhibits no effusion and no deformity.       Right ankle: Normal.       Left ankle: Normal.       Cervical back: She exhibits decreased range of motion and tenderness. She exhibits no deformity.       Thoracic back: She exhibits decreased range of motion and tenderness. She exhibits no deformity.       Lumbar back: She exhibits decreased range of motion and tenderness. She exhibits no deformity.       Right hand: Normal.       Left hand: Normal.       Right foot: Normal.       Left foot: Normal.  Neurological: She is alert and oriented to person, place, and time.  Reflex Scores:      Tricep reflexes are 2+ on the right side and 2+ on the left side.      Bicep reflexes are 2+ on the right side and 2+ on the left side.      Brachioradialis reflexes are 2+ on the right side and 2+ on the left side.      Patellar reflexes are 1+ on the right side and 1+ on the left side.      Achilles reflexes are 1+ on the right side and 1+ on the left side. Skin: Skin is warm and dry.  Psychiatric: She has a normal mood and affect.  Nursing note and vitals reviewed.  18/18 fibromyalgia tender points positive       Assessment & Plan:  1. Diffuse myalgias, chronic pain, this is consistent with fibromyalgia however she has not  had good response to typical medication such as Cymbalta, intolerant of gabapentin. She has not done much in terms of exercise and is somewhat limited by her other problems i.e. Back and neck pain.Would recommend aquatic exercise have discussed calling the [employer] to see when the classes are. She has not had the financial resources to pay co-pays for outpatient PT and OT  Overall she has poor tolerance to narcotic analgesics. She does have some relief with tramadol and I encouraged her to take this on a more regular basis. She will likely need to take stool softeners with this however due to constipation side effects  2. Chronic low back pain she has evidence of lumbar facet arthrosis.  From history it appears that she's had good relief with lumbar medial branch blocks. Would recommend repeat. If she gets only a short-term relief with medial branch block she may benefit from radiofrequency neurotomy at corresponding levels  3. Chronic neck pain she does have some limited range of motion however no clear cut signs of radiculopathy she does have some left shoulder pain as intermittent that could be related to C5-C6 stenosis. This is not one of her major concerns however

## 2014-12-17 NOTE — Patient Instructions (Addendum)
Aquatic exercise at [facility]  Will need driver for next injection

## 2014-12-29 ENCOUNTER — Encounter: Payer: Self-pay | Admitting: Cardiology

## 2014-12-31 ENCOUNTER — Other Ambulatory Visit: Payer: Self-pay | Admitting: Internal Medicine

## 2015-01-01 NOTE — Telephone Encounter (Signed)
Xanax too soon as was given 3 mo tx on June 29. 2016

## 2015-01-06 ENCOUNTER — Other Ambulatory Visit: Payer: Self-pay | Admitting: Internal Medicine

## 2015-01-07 ENCOUNTER — Ambulatory Visit (INDEPENDENT_AMBULATORY_CARE_PROVIDER_SITE_OTHER): Payer: Medicare Other | Admitting: Internal Medicine

## 2015-01-07 ENCOUNTER — Other Ambulatory Visit (INDEPENDENT_AMBULATORY_CARE_PROVIDER_SITE_OTHER): Payer: Medicare Other

## 2015-01-07 ENCOUNTER — Encounter: Payer: Self-pay | Admitting: Internal Medicine

## 2015-01-07 ENCOUNTER — Telehealth: Payer: Self-pay | Admitting: Internal Medicine

## 2015-01-07 VITALS — BP 122/78 | HR 85 | Ht 60.0 in | Wt 150.6 lb

## 2015-01-07 DIAGNOSIS — J309 Allergic rhinitis, unspecified: Secondary | ICD-10-CM | POA: Diagnosis not present

## 2015-01-07 DIAGNOSIS — R059 Cough, unspecified: Secondary | ICD-10-CM

## 2015-01-07 DIAGNOSIS — R05 Cough: Secondary | ICD-10-CM | POA: Diagnosis not present

## 2015-01-07 DIAGNOSIS — J3089 Other allergic rhinitis: Secondary | ICD-10-CM

## 2015-01-07 DIAGNOSIS — K219 Gastro-esophageal reflux disease without esophagitis: Secondary | ICD-10-CM

## 2015-01-07 DIAGNOSIS — J302 Other seasonal allergic rhinitis: Secondary | ICD-10-CM

## 2015-01-07 LAB — C-REACTIVE PROTEIN: CRP: 1.8 mg/dL (ref 0.5–20.0)

## 2015-01-07 LAB — SEDIMENTATION RATE: Sed Rate: 43 mm/hr — ABNORMAL HIGH (ref 0–22)

## 2015-01-07 MED ORDER — BENZONATATE 200 MG PO CAPS: 200.0000 mg | ORAL_CAPSULE | Freq: Three times a day (TID) | ORAL | Status: DC | PRN

## 2015-01-07 NOTE — Progress Notes (Signed)
01/07/15- 66 yoF never smoker referred courtesy of Dr Jenny Reichmann for allergy consultation . Husband is here  Specifically she is concerned about nonproductive cough present 5 years, worse lying down. No specific onset. She denies seasonal or exposure exacerbation and does not wheeze. Tried Delsym cough syrup. Tried albuterol inhaler "never learned to use it". Zyrtec helps nasal drainage but does not help cough. Not much postnasal drip. Cough is worse lying down at night. GERD fairly well controlled on twice daily Nexium 40 mg with occasional breakthrough. Occasional choking while eating. Pills may hang up mid esophagus. She states she stays on the sofa because of fibromyalgia. She and husband sleep with head of bed elevated. Older house has partial basement with sump pump in the basement and no recognized mold. 2 dogs. No smokers. Allergy vaccine as a young woman with Dr. Rayetta Pigg  Prior to Admission medications   Medication Sig Start Date End Date Taking? Authorizing Provider  albuterol (PROVENTIL HFA;VENTOLIN HFA) 108 (90 BASE) MCG/ACT inhaler Inhale 2 puffs into the lungs every 6 (six) hours as needed for wheezing or shortness of breath. 10/17/14  Yes Biagio Borg, MD  ALPRAZolam Duanne Moron) 0.5 MG tablet TAKE 1 TABLET BY MOUTH TWICE DAILY 11/28/14  Yes Biagio Borg, MD  Amino Acids (AMINO ACID PO) Take 1 capsule by mouth 2 (two) times daily.   Yes Historical Provider, MD  B Complex-Biotin-FA (B COMPLETE) TABS Take 1 tablet by mouth daily. B Complete 100mg    Yes Historical Provider, MD  baclofen (LIORESAL) 10 MG tablet TAKE 1 TABLET BY MOUTH THREE TIMES DAILY 10/30/14  Yes Lyndal Pulley, DO  buPROPion (WELLBUTRIN XL) 300 MG 24 hr tablet TAKE 1 TABLET (300 MG TOTAL) BY MOUTH DAILY. 06/14/14  Yes Biagio Borg, MD  calcium carbonate (OS-CAL) 600 MG TABS tablet Take 600 mg by mouth 2 (two) times daily with a meal.    Yes Historical Provider, MD  cetirizine (ZYRTEC) 10 MG tablet TAKE 1 TABLET (10 MG TOTAL) BY  MOUTH DAILY.   Yes Biagio Borg, MD  cholecalciferol (VITAMIN D) 1000 UNITS tablet Take 1,000 Units by mouth daily.   Yes Historical Provider, MD  clidinium-chlordiazePOXIDE (LIBRAX) 5-2.5 MG per capsule Take 1 capsule by mouth daily as needed (for stomach). 09/19/14  Yes Biagio Borg, MD  Coenzyme Q10 (CO Q-10) 200 MG CAPS Take 1 capsule by mouth 2 (two) times daily.   Yes Historical Provider, MD  esomeprazole (NEXIUM) 40 MG capsule Take 1 capsule (40 mg total) by mouth 2 (two) times daily before a meal. 04/12/13  Yes Biagio Borg, MD  estradiol (ESTRACE) 1 MG tablet Take 1 tablet (1 mg total) by mouth daily. 10/10/13  Yes Biagio Borg, MD  FLUoxetine (PROZAC) 20 MG capsule Take 1 capsule (20 mg total) by mouth daily. 09/05/14  Yes Biagio Borg, MD  fluticasone (FLONASE) 50 MCG/ACT nasal spray Place 2 sprays into both nostrils daily. 05/22/14  Yes Biagio Borg, MD  levothyroxine (SYNTHROID, LEVOTHROID) 25 MCG tablet Take 1 tablet (25 mcg total) by mouth daily before breakfast. 03/09/14  Yes Biagio Borg, MD  Meperidine HCl (DEMEROL PO) Take by mouth as needed. Takes for headaches unsure dose   Yes Historical Provider, MD  Multiple Vitamin (MULTIVITAMIN WITH MINERALS) TABS tablet Take 1 tablet by mouth daily.   Yes Historical Provider, MD  Omega-3 Fatty Acids (SALMON OIL-1000 PO) Take 1,000 mg by mouth daily.   Yes Historical Provider, MD  ondansetron (ZOFRAN) 4 MG tablet Take 1 tablet (4 mg total) by mouth every 8 (eight) hours as needed for nausea or vomiting. 10/17/14  Yes Biagio Borg, MD  potassium chloride (KLOR-CON 10) 10 MEQ tablet Take 1 tablet (10 mEq total) by mouth daily. 05/28/14  Yes Biagio Borg, MD  topiramate (TOPAMAX) 50 MG tablet Take 50-250 mg by mouth 2 (two) times daily. Take 50mg  (1 tablet) daily in the morning.  Take 250mg  (5 tablets) daily at bedtime 04/14/13  Yes Historical Provider, MD  traMADol (ULTRAM) 50 MG tablet 1-2 tabs by mouth three times daily as needed for pain 08/01/14   Yes Biagio Borg, MD  triamcinolone cream (KENALOG) 0.5 % Apply topically 2 (two) times daily. As needed for rash 04/07/12  Yes Biagio Borg, MD  triamterene-hydrochlorothiazide (MAXZIDE-25) 37.5-25 MG per tablet Take 1 tablet by mouth daily. 09/19/14  Yes Biagio Borg, MD  TURMERIC PO Take by mouth.   Yes Historical Provider, MD  benzonatate (TESSALON) 200 MG capsule Take 1 capsule (200 mg total) by mouth 3 (three) times daily as needed for cough. 01/07/15   Deneise Lever, MD   Past Medical History  Diagnosis Date  . HYPERLIPIDEMIA   . ANXIETY   . DEPRESSION   . MIGRAINE, COMMON   . MIGRAINE HEADACHE   . VENOUS INSUFFICIENCY, CHRONIC   . SINUSITIS- ACUTE-NOS   . ALLERGIC RHINITIS   . GERD   . IBS   . VAGINITIS   . SKIN LESION   . Enthesopathy of hip region   . FOOT PAIN, BILATERAL   . PLANTAR FASCIITIS   . OSTEOPOROSIS   . OSTEOPENIA   . RASH-NONVESICULAR   . NECK MASS   . Hiatal hernia   . Hypothyroidism   . Bronchitis   . Anemia   . Complication of anesthesia     says one time waking up she couldn't breathe, felt like her throat closing up  . Family history of anesthesia complication     sister with n/v   Past Surgical History  Procedure Laterality Date  . Appendectomy    . Cholecystectomy    . 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 Oophorectomy    . Shoulder surgery    . S/p neck lump removal    . Breast enhancement surgery    . Vaginal hysterectomy    . Tonsillectomy    . Colonoscopy    . Sinus endo w/fusion Right 06/30/2013    Procedure: RIGHT ENDOSCOPIC SPHENOIDECTOMY WITH FUSION SCAN;  Surgeon: [name], MD;  Location: [facility] OR;  Service: ENT;  Laterality: Right;   Family History  Problem Relation Age of Onset  . Diabetes Mother   . Diabetes Sister   . Breast cancer Maternal Grandmother   . Heart disease Maternal Uncle     x 2  . Diabetes Other     multi relatives on father side  . Heart disease Maternal Aunt   .  Kidney disease Paternal Uncle     questionable  . Colon cancer Neg Hx   . Colon polyps Neg Hx   . Rectal cancer Neg Hx   . Stomach cancer Neg Hx   . Heart disease Father    History   Social History  . Marital Status: Married    Spouse Name: N/A  . Number of Children: 2  . Years of Education: N/A   Occupational History  .  retired Press photographer    Social History Main Topics  . Smoking status: Never Smoker   . Smokeless tobacco: Never Used  . Alcohol Use: No  . Drug Use: No  . Sexual Activity: Not on file   Other Topics Concern  . Not on file   Social History Narrative   Has 2 biological children and 1 step child   ROS-see HPI   Negative unless "+" Constitutional:    weight loss, night sweats, fevers, chills, fatigue, lassitude. HEENT:   + headaches, difficulty swallowing, tooth/dental problems, sore throat,       +sneezing, itching, ear ache, nasal congestion, post nasal drip, snoring CV:    chest pain, orthopnea, PND, swelling in lower extremities, anasarca,                                                      dizziness, palpitations Resp:   shortness of breath with exertion or at rest.                productive cough,   +non-productive cough, coughing up of blood.              change in color of mucus.  wheezing.   Skin:    rash or lesions. GI:  +heartburn, indigestion, abdominal pain, nausea, vomiting, diarrhea,                 change in bowel habits, loss of appetite GU: dysuria, change in color of urine, no urgency or frequency.   flank pain. MS:   +joint pain, stiffness, decreased range of motion, back pain. Neuro-     nothing unusual Psych:  change in mood or affect.  depression or anxiety.   memory loss.  OBJ- Physical Exam General- Alert, Oriented, Affect-appropriate, Distress- none acute Skin- rash-none, lesions- none, excoriation- none Lymphadenopathy- none Head- atraumatic            Eyes- Gross vision intact, PERRLA, conjunctivae and secretions clear             Ears- Hearing, canals-normal            Nose- Clear, no-Septal dev, mucus, polyps, erosion, perforation             Throat- Mallampati II , mucosa clear , drainage- none, tonsils- atrophic Neck- flexible , trachea midline, no stridor , thyroid nl, carotid no bruit Chest - symmetrical excursion , unlabored           Heart/CV- RRR , no murmur , no gallop  , no rub, nl s1 s2                           - JVD- none , edema- none, stasis changes- none, varices- none           Lung- clear to P&A, wheeze- none, cough + minor/dry , dullness-none, rub- none           Chest wall-  Abd-  Br/ Gen/ Rectal- Not done, not indicated Extrem- cyanosis- none, clubbing, none, atrophy- none, strength- nl Neuro- grossly intact to observation

## 2015-01-07 NOTE — Telephone Encounter (Signed)
Patient has been unsatisfied with her care and would like to change providers. She would like to stay in our office and transfer to Dr. Doug Sou. Please advise

## 2015-01-07 NOTE — Assessment & Plan Note (Signed)
She gives history of GERD and states that cough is worst when she is lying down, suggesting there may be a reflux component despite having head of bed elevated and regular treatment with acid blockers. Plan-allergy profiles, C-reactive protein, sedimentation rate, trial of Tessalon Perles

## 2015-01-07 NOTE — Telephone Encounter (Signed)
Patient states that the pharmacy is telling her she has no more refills for her ALPRAZolam (XANAX) 0.5 MG tablet [[id number]. Her prescription says 2 more refills.

## 2015-01-07 NOTE — Assessment & Plan Note (Signed)
Remote history of positive skin testing and several years on allergy vaccine for allergic rhinitis symptoms in the past. Some nasal stuffiness now may be residual. This may affect treatment options.

## 2015-01-07 NOTE — Assessment & Plan Note (Signed)
Control may be only fair despite twice daily acid blocker, elevation of head of bed, some care with diet. She admits some pill hanging up mid esophagus at times, suggesting a degree of esophageal stricture.

## 2015-01-07 NOTE — Patient Instructions (Signed)
Script sent for benzonatate perles to use as needed for cough  Suggest restarting otc Flonase/ fluticasone nasal spray    1-2 puffs each nostril once daily at bedtime   Order labs- Allergy profile, Food IgE profile, C-reactive protein, sed rate

## 2015-01-08 LAB — ALLERGY FULL PROFILE
Allergen, D pternoyssinus,d7: <0.1 kU/L
Allergen,Goose feathers, e70: <0.1 kU/L
Alternaria Alternata: <0.1 kU/L
Aspergillus fumigatus, m3: <0.1 kU/L
Bahia Grass: <0.1 kU/L
Bermuda Grass: <0.1 kU/L
Box Elder IgE: <0.1 kU/L
Candida Albicans: <0.1 kU/L
Cat Dander: <0.1 kU/L
Common Ragweed: <0.1 kU/L
Curvularia lunata: <0.1 kU/L
D. farinae: <0.1 kU/L
Dog Dander: <0.1 kU/L
Elm IgE: <0.1 kU/L
Fescue: <0.1 kU/L
G005 Rye, Perennial: <0.1 kU/L
G009 Red Top: <0.1 kU/L
Goldenrod: <0.1 kU/L
Helminthosporium halodes: <0.1 kU/L
House Dust Hollister: <0.1 kU/L
Lamb's Quarters: <0.1 kU/L
Oak: <0.1 kU/L
Plantain: <0.1 kU/L
Stemphylium Botryosum: <0.1 kU/L
Sycamore Tree: <0.1 kU/L
Timothy Grass: <0.1 kU/L

## 2015-01-08 LAB — ALLERGEN FOOD PROFILE SPECIFIC IGE
Apple: <0.1 kU/L
Chicken IgE: <0.1 kU/L
Corn: <0.1 kU/L
Egg White IgE: <0.1 kU/L
Fish Cod: <0.1 kU/L
IgE (Immunoglobulin E), Serum: 15 kU/L (ref <115)
Milk IgE: <0.1 kU/L
Orange: <0.1 kU/L
Peanut IgE: <0.1 kU/L
Shrimp IgE: <0.1 kU/L
Soybean IgE: 0.14 kU/L — ABNORMAL HIGH
Tomato IgE: <0.1 kU/L
Tuna IgE: <0.1 kU/L
Wheat IgE: <0.1 kU/L

## 2015-01-08 NOTE — Telephone Encounter (Signed)
Rx verified with pharmacy, refill available for pick up. Pt advised of same

## 2015-01-08 NOTE — Telephone Encounter (Signed)
Both xanax and tramadol too soon based on last rx dates

## 2015-01-08 NOTE — Telephone Encounter (Signed)
Ok with me 

## 2015-01-09 ENCOUNTER — Telehealth: Payer: Self-pay | Admitting: Internal Medicine

## 2015-01-09 NOTE — Telephone Encounter (Signed)
Notes Recorded by Deneise Lever, MD on 01/08/2015 at 2:24 PM Allergy blood tests for environmental and food allergies are negative. There is a very small elevation for soy bean which is not likely to be important --  I spoke with patient about results and she verbalized understanding and had no questions

## 2015-01-15 ENCOUNTER — Telehealth: Payer: Self-pay | Admitting: Internal Medicine

## 2015-01-15 NOTE — Telephone Encounter (Signed)
Patient returned call and can be reached at [phone]

## 2015-01-15 NOTE — Telephone Encounter (Signed)
lmtcb x1 

## 2015-01-15 NOTE — Telephone Encounter (Signed)
Spoke with patient-she stated she was waiting on a call back from [place] to give her the results of her labs from 01-07-15. Pt states she was never told the results on 01-09-15. I went back over the results with patient in detail; no further questions or concerns from patient. Nothing more needed a this time.

## 2015-01-19 ENCOUNTER — Other Ambulatory Visit: Payer: Self-pay | Admitting: Internal Medicine

## 2015-01-21 ENCOUNTER — Other Ambulatory Visit: Payer: Self-pay

## 2015-01-21 MED ORDER — ESTRADIOL 1 MG PO TABS: 1.0000 mg | ORAL_TABLET | Freq: Every day | ORAL | Status: DC

## 2015-01-23 DIAGNOSIS — G43009 Migraine without aura, not intractable, without status migrainosus: Secondary | ICD-10-CM | POA: Diagnosis not present

## 2015-01-23 DIAGNOSIS — G4489 Other headache syndrome: Secondary | ICD-10-CM | POA: Diagnosis not present

## 2015-01-24 ENCOUNTER — Telehealth: Payer: Self-pay | Admitting: Internal Medicine

## 2015-01-24 MED ORDER — ESTRADIOL 1 MG PO TABS: 1.0000 mg | ORAL_TABLET | Freq: Every day | ORAL | Status: DC

## 2015-01-24 NOTE — Telephone Encounter (Signed)
Pt called stating [facility] on [address] didn't receive the prescription for estradiol (ESTRACE) 1 MG tablet [[id number]

## 2015-01-25 NOTE — Telephone Encounter (Signed)
Pt informed

## 2015-01-25 NOTE — Telephone Encounter (Signed)
Due to recent illness and high new patient volume would recommend that she try to switch to new provider starting in October.

## 2015-01-28 ENCOUNTER — Other Ambulatory Visit: Payer: Self-pay | Admitting: Family Medicine

## 2015-01-29 ENCOUNTER — Encounter: Payer: Self-pay | Admitting: Physical Medicine & Rehabilitation

## 2015-01-29 ENCOUNTER — Encounter: Payer: Medicare Other | Attending: Physical Medicine & Rehabilitation

## 2015-01-29 ENCOUNTER — Ambulatory Visit (HOSPITAL_BASED_OUTPATIENT_CLINIC_OR_DEPARTMENT_OTHER): Payer: Medicare Other | Admitting: Physical Medicine & Rehabilitation

## 2015-01-29 VITALS — BP 141/55 | HR 94 | Resp 16

## 2015-01-29 DIAGNOSIS — G894 Chronic pain syndrome: Secondary | ICD-10-CM | POA: Insufficient documentation

## 2015-01-29 DIAGNOSIS — M791 Myalgia: Secondary | ICD-10-CM | POA: Diagnosis present

## 2015-01-29 DIAGNOSIS — Z800 Family history of malignant neoplasm of digestive organs: Secondary | ICD-10-CM | POA: Diagnosis not present

## 2015-01-29 DIAGNOSIS — M47816 Spondylosis without myelopathy or radiculopathy, lumbar region: Secondary | ICD-10-CM

## 2015-01-29 DIAGNOSIS — M542 Cervicalgia: Secondary | ICD-10-CM | POA: Diagnosis not present

## 2015-01-29 DIAGNOSIS — K219 Gastro-esophageal reflux disease without esophagitis: Secondary | ICD-10-CM | POA: Diagnosis not present

## 2015-01-29 DIAGNOSIS — Z833 Family history of diabetes mellitus: Secondary | ICD-10-CM | POA: Diagnosis not present

## 2015-01-29 DIAGNOSIS — K589 Irritable bowel syndrome without diarrhea: Secondary | ICD-10-CM | POA: Insufficient documentation

## 2015-01-29 DIAGNOSIS — M810 Age-related osteoporosis without current pathological fracture: Secondary | ICD-10-CM | POA: Diagnosis not present

## 2015-01-29 DIAGNOSIS — Z8249 Family history of ischemic heart disease and other diseases of the circulatory system: Secondary | ICD-10-CM | POA: Diagnosis not present

## 2015-01-29 DIAGNOSIS — M549 Dorsalgia, unspecified: Secondary | ICD-10-CM | POA: Insufficient documentation

## 2015-01-29 DIAGNOSIS — E785 Hyperlipidemia, unspecified: Secondary | ICD-10-CM | POA: Diagnosis not present

## 2015-01-29 NOTE — Progress Notes (Signed)
  PROCEDURE RECORD [facility]   Name: NEKEYA BRISKI DOB:01/09/1941 MRN: [id number]  Date:01/29/2015  Physician: Alysia Penna, MD    Nurse/CMA: Shumaker RN  Allergies:  Allergies  Allergen Reactions  . Prednisone Anaphylaxis    Patient unable to remember why she needed to steroid shot but just knows that when she got back to work she started having a lot of trouble breathing.  (jkl 05/04/14)  . Aspirin Other (See Comments)    Stomach ache and bleed  . Erythromycin Diarrhea  . Latex Other (See Comments)    blisters  . Levofloxacin Other (See Comments)    Headaches, GI upset  . Nsaids Other (See Comments)    stomach bleeding per pt  . Statins Nausea Only and Other (See Comments)    Headache, upset stomach, joint hurt, heartburn  . Sumatriptan Hives and Palpitations  . Adhesive [Tape] Other (See Comments)    blisters  . Amoxicillin-Pot Clavulanate   . Doxycycline   . Ivp Dye [Iodinated Diagnostic Agents]     If made with blue shellfish then CAN NOT have this type of dye.   . Methylphenidate Hcl   . Mirtazapine   . Shellfish Allergy     Blue shellfish  . Hydrocodone Itching  . Hydrocodone-Acetaminophen Itching  . Rofecoxib Nausea Only  . Sertraline Hcl Nausea Only  . Sulfonamide Derivatives Itching    Consent Signed: Yes.    Is patient diabetic? No.  CBG today?  Pregnant: No. LMP: No LMP recorded. Patient has had a hysterectomy. (age 8-55)  Anticoagulants: no Anti-inflammatory: no Antibiotics: no  Procedure: Bilateral Medial Branch Blocks L 3-4-5 Position: Prone  NO STEROID USED Start Time: 256 End Time: 307 Fluoro Time: 41 seconds  RN/CMA Health and safety inspector RN    Time 243 315    BP 141/55 147/81    Pulse 94 94    Respirations 16 16    O2 Sat 95 98    S/S 6 6    Pain Level 10 3/10     D/C home with husband , patient A & O X 3, D/C instructions reviewed, and sits independently.

## 2015-01-29 NOTE — Patient Instructions (Signed)
Lumbar medial branch blocks were performed. This is to help diagnose the cause of the low back pain. It is important that you keep track of your pain for the first day or 2 after injection. This injection can give you temporary relief that lasts for hours or up to several months. There is no way to predict duration of pain relief.  Please try to compare your pain after injection to for the injection.  If this injection gives you  temporary relief there may be another longer-lasting procedure that may be beneficial call radiofrequency ablation  Injected Lidocaine 2% and Omnipaque

## 2015-01-29 NOTE — Telephone Encounter (Signed)
Refill done.  

## 2015-01-29 NOTE — Progress Notes (Signed)
Bilateral Lumbar L2, L3, L4  medial branch blocks and L 5 dorsal ramus injection under fluoroscopic guidance  Indication: Lumbar pain which is not relieved by medication management or other conservative care and interfering with self-care and mobility.  Informed consent was obtained after describing risks and benefits of the procedure with the patient, this includes bleeding, infection, paralysis and medication side effects.  The patient wishes to proceed and has given written consent.  The patient was placed in prone position.  The lumbar area was marked and prepped with Betadine.  One mL of 1% lidocaine was injected into each of 6 areas into the skin and subcutaneous tissue.  Then a 22-gauge 3.5in spinal needle was inserted targeting the junction of the left L3 superior articular process transverse process  junction. Needle was advanced under fluoroscopic guidance.  Bone contact was made.  Omnipaque 180 was injected x 0.5 mL demonstrating no intravascular uptake.  Then a solution containing  2% MPF lidocaine was injected x 0.5 mL.  Then the left L5 superior articular process in transverse process junction was targeted.  Bone contact was made.  Omnipaque 180 was injected x 0.5 mL demonstrating no intravascular uptake. Then a solution containing 2% MPF lidocaine was injected x 0.5 mL.  Then the left L4 superior articular process in transverse process junction was targeted.  Bone contact was made.  Omnipaque 180 was injected x 0.5 mL demonstrating no intravascular uptake.  Then a solution containing2% MPF lidocaine was injected x 0.5 mL.  This same procedure was performed on the right side using the same needle, technique and injectate.  Patient tolerated procedure well.  Post procedure instructions were given.

## 2015-03-04 ENCOUNTER — Other Ambulatory Visit: Payer: Self-pay | Admitting: Internal Medicine

## 2015-03-07 ENCOUNTER — Telehealth: Payer: Self-pay | Admitting: *Deleted

## 2015-03-07 NOTE — Telephone Encounter (Signed)
Called our office because "someone tried to call this morning and she missed the call".  I informed her that it was a reminder call for her appt on Tuesday 03/12/15.  She said ok but she does not want anymore injections.  Follow up only. Noted

## 2015-03-12 ENCOUNTER — Encounter: Payer: Self-pay | Admitting: Physical Medicine & Rehabilitation

## 2015-03-12 ENCOUNTER — Encounter: Payer: Medicare Other | Attending: Physical Medicine & Rehabilitation

## 2015-03-12 ENCOUNTER — Ambulatory Visit (HOSPITAL_BASED_OUTPATIENT_CLINIC_OR_DEPARTMENT_OTHER): Payer: Medicare Other | Admitting: Physical Medicine & Rehabilitation

## 2015-03-12 VITALS — BP 135/69 | HR 96 | Resp 14

## 2015-03-12 DIAGNOSIS — Z800 Family history of malignant neoplasm of digestive organs: Secondary | ICD-10-CM | POA: Diagnosis not present

## 2015-03-12 DIAGNOSIS — Z8249 Family history of ischemic heart disease and other diseases of the circulatory system: Secondary | ICD-10-CM | POA: Insufficient documentation

## 2015-03-12 DIAGNOSIS — Z833 Family history of diabetes mellitus: Secondary | ICD-10-CM | POA: Insufficient documentation

## 2015-03-12 DIAGNOSIS — K219 Gastro-esophageal reflux disease without esophagitis: Secondary | ICD-10-CM | POA: Insufficient documentation

## 2015-03-12 DIAGNOSIS — M810 Age-related osteoporosis without current pathological fracture: Secondary | ICD-10-CM | POA: Diagnosis not present

## 2015-03-12 DIAGNOSIS — M47816 Spondylosis without myelopathy or radiculopathy, lumbar region: Secondary | ICD-10-CM | POA: Insufficient documentation

## 2015-03-12 DIAGNOSIS — E785 Hyperlipidemia, unspecified: Secondary | ICD-10-CM | POA: Diagnosis not present

## 2015-03-12 DIAGNOSIS — M5030 Other cervical disc degeneration, unspecified cervical region: Secondary | ICD-10-CM

## 2015-03-12 DIAGNOSIS — G894 Chronic pain syndrome: Secondary | ICD-10-CM | POA: Diagnosis not present

## 2015-03-12 DIAGNOSIS — M549 Dorsalgia, unspecified: Secondary | ICD-10-CM | POA: Diagnosis not present

## 2015-03-12 DIAGNOSIS — M542 Cervicalgia: Secondary | ICD-10-CM | POA: Insufficient documentation

## 2015-03-12 DIAGNOSIS — K589 Irritable bowel syndrome without diarrhea: Secondary | ICD-10-CM | POA: Diagnosis not present

## 2015-03-12 DIAGNOSIS — M797 Fibromyalgia: Secondary | ICD-10-CM | POA: Diagnosis not present

## 2015-03-12 DIAGNOSIS — M7542 Impingement syndrome of left shoulder: Secondary | ICD-10-CM

## 2015-03-12 DIAGNOSIS — M7061 Trochanteric bursitis, right hip: Secondary | ICD-10-CM | POA: Insufficient documentation

## 2015-03-12 DIAGNOSIS — M791 Myalgia: Secondary | ICD-10-CM | POA: Diagnosis present

## 2015-03-12 DIAGNOSIS — M7062 Trochanteric bursitis, left hip: Secondary | ICD-10-CM

## 2015-03-12 MED ORDER — ACETAMINOPHEN-CODEINE #3 300-30 MG PO TABS: 1.0000 | ORAL_TABLET | Freq: Three times a day (TID) | ORAL | Status: DC | PRN

## 2015-03-12 NOTE — Patient Instructions (Signed)
We may repeat shoulder injection every 3 months if this is helpful. You also may benefit from hip injections.

## 2015-03-12 NOTE — Progress Notes (Signed)
Subjective:    Patient ID: Laura Barnes, female    DOB: 1940-09-01, 74 y.o.   MRN: [id number]  HPI  74 year old female with chief complaint of pain all over body. She was last seen by me on 01/29/2015 at which time we did bilateral L2-L3 L4-L5 medial branch blocks. Her pain went down from 10/10-3/10 for about 2 hours. She is having some constipation on her current pain medicine, tramadol. Patient gives a pain reduction of approximately 2 points out of 10 on tramadol. She states she took some of her husbands oxycodone which he was taking postoperatively and stated that helped her pain. The dose was reported as 5 mg.  Patient has had imaging studies showing severe cervical spinal stenosis She also has evidence of moderate lumbar spondylosis.  Patient also complaining of shoulder pain, she has some give way feeling when she tries to pick up objects. Pain Inventory Average Pain 8 Pain Right Now 7 My pain is aching  In the last 24 hours, has pain interfered with the following? General activity 9 Relation with others 7 Enjoyment of life 7 What TIME of day is your pain at its worst? varies Sleep (in general) Fair  Pain is worse with: walking and standing Pain improves with: rest and medication Relief from Meds: 2  Mobility walk without assistance how many minutes can you walk? 15-20 ability to climb steps?  yes do you drive?  yes  Function retired  Neuro/Psych depression  Prior Studies Any changes since last visit?  no  Physicians involved in your care Any changes since last visit?  no   Family History  Problem Relation Age of Onset  . Diabetes Mother   . Diabetes Sister   . Breast cancer Maternal Grandmother   . Heart disease Maternal Uncle     x 2  . Diabetes Other     multi relatives on father side  . Heart disease Maternal Aunt   . Kidney disease Paternal Uncle     questionable  . Colon cancer Neg Hx   . Colon polyps Neg Hx   . Rectal cancer Neg  Hx   . Stomach cancer Neg Hx   . Heart disease Father    Social History   Social History  . Marital Status: Married    Spouse Name: N/A  . Number of Children: 2  . Years of Education: N/A   Occupational History  . retired Press photographer    Social History Main Topics  . Smoking status: Never Smoker   . Smokeless tobacco: Never Used  . Alcohol Use: No  . Drug Use: No  . Sexual Activity: Not Asked   Other Topics Concern  . None   Social History Narrative   Has 2 biological children and 1 step child   Past Surgical History  Procedure Laterality Date  . Appendectomy    . Cholecystectomy    . 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 Oophorectomy    . Shoulder surgery    . S/p neck lump removal    . Breast enhancement surgery    . Vaginal hysterectomy    . Tonsillectomy    . Colonoscopy    . Sinus endo w/fusion Right 06/30/2013    Procedure: RIGHT ENDOSCOPIC SPHENOIDECTOMY WITH FUSION SCAN;  Surgeon: [name], MD;  Location: [facility];  Service: ENT;  Laterality: Right;   Past Medical History  Diagnosis Date  . HYPERLIPIDEMIA   .  ANXIETY   . DEPRESSION   . MIGRAINE, COMMON   . MIGRAINE HEADACHE   . VENOUS INSUFFICIENCY, CHRONIC   . SINUSITIS- ACUTE-NOS   . ALLERGIC RHINITIS   . GERD   . IBS   . VAGINITIS   . SKIN LESION   . Enthesopathy of hip region   . FOOT PAIN, BILATERAL   . PLANTAR FASCIITIS   . OSTEOPOROSIS   . OSTEOPENIA   . RASH-NONVESICULAR   . NECK MASS   . Hiatal hernia   . Hypothyroidism   . Bronchitis   . Anemia   . Complication of anesthesia     says one time waking up she couldn't breathe, felt like her throat closing up  . Family history of anesthesia complication     sister with n/v   BP 135/69 mmHg  Pulse 96  Resp 14  SpO2 97%  Opioid Risk Score:   Fall Risk Score:  `1  Depression screen PHQ 2/9  Depression screen [facility] 2/9 01/29/2015 12/17/2014 05/01/2014  Decreased Interest 0 0 0  Down, Depressed,  Hopeless 0 0 0  PHQ - 2 Score 0 0 0  Altered sleeping - 0 -  Tired, decreased energy - 0 -  Change in appetite - 0 -  Feeling bad or failure about yourself  - 0 -  Trouble concentrating - 0 -  Moving slowly or fidgety/restless - 0 -  Suicidal thoughts - 0 -  PHQ-9 Score - 0 -     Review of Systems  Respiratory: Positive for cough.   Gastrointestinal: Positive for nausea and abdominal pain.  Psychiatric/Behavioral: Positive for dysphoric mood.  All other systems reviewed and are negative.      Objective:   Physical Exam  Constitutional: She is oriented to person, place, and time. She appears well-developed and well-nourished.  HENT:  Head: Normocephalic and atraumatic.  Eyes: Conjunctivae are normal. Pupils are equal, round, and reactive to light.  Neck: Normal range of motion.  Neurological: She is alert and oriented to person, place, and time.  Psychiatric: She has a normal mood and affect.  Nursing note and vitals reviewed.  Patient has 5/5 strength in bilateral deltoid, biceps, triceps, grip, hip flexor, knee extensor, ankle dorsiflexor and plantar flexor  Pain with overhead reaching bilateral deltoid area.  Lumbar spine range of motion is full in all directions.  Cervical spine range of motion is full  18 out of 18 fibromyalgia tender points positive  Left shoulder positive impingement signs at 90 with internal rotation  Tenderness over bilateral greater trochanters      Assessment & Plan:  1.Fibromyalgia syndrome with widespread body pain. Continue fluoxetine Encourage ambulation and moderate aerobic exercise Since the patient's already on Topamax for headaches, would avoid Lyrica or gabapentin  2. Cervical spondylosis, severe, lumbar spondylosis moderate. Will discontinue tramadol and start Tylenol No. 3 one tablet 3 times per day. We discussed constipation may be more severe than with tramadol, patient is Taking laxatives and she will make necessary  adjustments  3. Left subacromial bursitis will inject today  Shoulder injection Left   Indication: Left Shoulder pain not relieved by medication management and other conservative care.  Informed consent was obtained after describing risks and benefits of the procedure with the patient, this includes bleeding, bruising, infection and medication side effects. The patient wishes to proceed and has given written consent. Patient was placed in a seated position. The Left shoulder was marked and prepped with betadine in the  subacromial area. A 25-gauge 1-1/2 inch needle was inserted into the subacromial area. After negative draw back for blood, a solution containing 1 mL of 6 mg per ML betamethasone and 4 mL of 1% lidocaine was injected. A band aid was applied. The patient tolerated the procedure well. Post procedure instructions were given.   4. Probable bilateral trochanteric bursitis, patient may benefit from injections if this becomes more problematic with sleep

## 2015-03-14 ENCOUNTER — Other Ambulatory Visit: Payer: Self-pay | Admitting: Internal Medicine

## 2015-03-21 ENCOUNTER — Telehealth: Payer: Self-pay | Admitting: *Deleted

## 2015-03-21 MED ORDER — ALPRAZOLAM 0.5 MG PO TABS: 0.5000 mg | ORAL_TABLET | Freq: Two times a day (BID) | ORAL | Status: DC

## 2015-03-21 NOTE — Telephone Encounter (Signed)
rx faxed to pharm

## 2015-03-21 NOTE — Telephone Encounter (Signed)
Left msg on triage stating been trying to get refill on her alprazolam. Pharmacy states they sent md request but have not heard back. Pls advise...Johny Chess

## 2015-03-21 NOTE — Telephone Encounter (Signed)
Done hardcopy to tamara 

## 2015-03-26 ENCOUNTER — Encounter: Payer: Self-pay | Admitting: Internal Medicine

## 2015-03-26 ENCOUNTER — Ambulatory Visit (INDEPENDENT_AMBULATORY_CARE_PROVIDER_SITE_OTHER): Payer: Medicare Other | Admitting: Internal Medicine

## 2015-03-26 VITALS — BP 136/86 | HR 98 | Temp 98.4°F | Resp 16 | Wt 146.0 lb

## 2015-03-26 DIAGNOSIS — Z23 Encounter for immunization: Secondary | ICD-10-CM

## 2015-03-26 DIAGNOSIS — G894 Chronic pain syndrome: Secondary | ICD-10-CM

## 2015-03-26 MED ORDER — TRAMADOL HCL 50 MG PO TABS: 50.0000 mg | ORAL_TABLET | Freq: Three times a day (TID) | ORAL | Status: DC | PRN

## 2015-03-26 MED ORDER — ONDANSETRON HCL 4 MG PO TABS: 4.0000 mg | ORAL_TABLET | Freq: Three times a day (TID) | ORAL | Status: DC | PRN

## 2015-03-26 NOTE — Progress Notes (Signed)
Subjective:    Patient ID: Laura Barnes, female    DOB: May 08, 1941, 74 y.o.   MRN: [id number]  HPI  Here to f/u, Pt continues to have recurring LBP without change in severity, bowel or bladder change, fever, wt loss,  worsening LE pain/numbness/weakness, gait change or falls. Asks for med refills for FMS flare in last 2 wks.  Pt denies chest pain, increased sob or doe, wheezing, orthopnea, PND, increased LE swelling, palpitations, dizziness or syncope. Pt denies new neurological symptoms such as new headache, or facial or extremity weakness or numbness  Due for flu shot today  Denies urinary symptoms such as dysuria, frequency, urgency, flank pain, hematuria or n/v, fever, chills. Past Medical History  Diagnosis Date  . HYPERLIPIDEMIA   . ANXIETY   . DEPRESSION   . MIGRAINE, COMMON   . MIGRAINE HEADACHE   . VENOUS INSUFFICIENCY, CHRONIC   . SINUSITIS- ACUTE-NOS   . ALLERGIC RHINITIS   . GERD   . IBS   . VAGINITIS   . SKIN LESION   . Enthesopathy of hip region   . FOOT PAIN, BILATERAL   . PLANTAR FASCIITIS   . OSTEOPOROSIS   . OSTEOPENIA   . RASH-NONVESICULAR   . NECK MASS   . Hiatal hernia   . Hypothyroidism   . Bronchitis   . Anemia   . Complication of anesthesia     says one time waking up she couldn't breathe, felt like her throat closing up  . Family history of anesthesia complication     sister with n/v   Past Surgical History  Procedure Laterality Date  . Appendectomy    . Cholecystectomy    . 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 Oophorectomy    . Shoulder surgery    . S/p neck lump removal    . Breast enhancement surgery    . Vaginal hysterectomy    . Tonsillectomy    . Colonoscopy    . Sinus endo w/fusion Right 06/30/2013    Procedure: RIGHT ENDOSCOPIC SPHENOIDECTOMY WITH FUSION SCAN;  Surgeon: [name], MD;  Location: [place];  Service: ENT;  Laterality: Right;    reports that she has never smoked. She has never  used smokeless tobacco. She reports that she does not drink alcohol or use illicit drugs. family history includes Breast cancer in her maternal grandmother; Diabetes in her mother, other, and sister; Heart disease in her father, maternal aunt, and maternal uncle; Kidney disease in her paternal uncle. There is no history of Colon cancer, Colon polyps, Rectal cancer, or Stomach cancer. Allergies  Allergen Reactions  . Prednisone Anaphylaxis    Patient unable to remember why she needed to steroid shot but just knows that when she got back to work she started having a lot of trouble breathing.  (jkl 05/04/14)  . Aspirin Other (See Comments)    Stomach ache and bleed  . Erythromycin Diarrhea  . Latex Other (See Comments)    blisters  . Levofloxacin Other (See Comments)    Headaches, GI upset  . Nsaids Other (See Comments)    stomach bleeding per pt  . Statins Nausea Only and Other (See Comments)    Headache, upset stomach, joint hurt, heartburn  . Sumatriptan Hives and Palpitations  . Adhesive [Tape] Other (See Comments)    blisters  . Amoxicillin-Pot Clavulanate   . Doxycycline   . Ivp Dye [Iodinated Diagnostic Agents]  If made with blue shellfish then CAN NOT have this type of dye.   . Methylphenidate Hcl   . Mirtazapine   . Shellfish Allergy     Blue shellfish  . Hydrocodone Itching  . Hydrocodone-Acetaminophen Itching  . Rofecoxib Nausea Only  . Sertraline Hcl Nausea Only  . Sulfonamide Derivatives Itching   Current Outpatient Prescriptions on File Prior to Visit  Medication Sig Dispense Refill  . acetaminophen-codeine (TYLENOL #3) 300-30 MG tablet Take 1 tablet by mouth every 8 (eight) hours as needed for moderate pain. 90 tablet 0  . albuterol (PROVENTIL HFA;VENTOLIN HFA) 108 (90 BASE) MCG/ACT inhaler Inhale 2 puffs into the lungs every 6 (six) hours as needed for wheezing or shortness of breath. 1 Inhaler 11  . ALPRAZolam (XANAX) 0.5 MG tablet Take 1 tablet (0.5 mg total) by  mouth 2 (two) times daily. 60 tablet 2  . Amino Acids (AMINO ACID PO) Take 1 capsule by mouth 2 (two) times daily.    . B Complex-Biotin-FA (B COMPLETE) TABS Take 1 tablet by mouth daily. B Complete 100mg     . baclofen (LIORESAL) 10 MG tablet TAKE 1 TABLET BY MOUTH THREE TIMES DAILY 270 tablet 0  . benzonatate (TESSALON) 200 MG capsule Take 1 capsule (200 mg total) by mouth 3 (three) times daily as needed for cough. 30 capsule 3  . buPROPion (WELLBUTRIN XL) 300 MG 24 hr tablet TAKE 1 TABLET (300 MG TOTAL) BY MOUTH DAILY. 90 tablet 3  . calcium carbonate (OS-CAL) 600 MG TABS tablet Take 600 mg by mouth 2 (two) times daily with a meal.     . cetirizine (ZYRTEC) 10 MG tablet TAKE 1 TABLET (10 MG TOTAL) BY MOUTH DAILY. 30 tablet 11  . cholecalciferol (VITAMIN D) 1000 UNITS tablet Take 1,000 Units by mouth daily.    . clidinium-chlordiazePOXIDE (LIBRAX) 5-2.5 MG per capsule Take 1 capsule by mouth daily as needed (for stomach). 60 capsule 4  . Coenzyme Q10 (CO Q-10) 200 MG CAPS Take 1 capsule by mouth 2 (two) times daily.    Marland Kitchen esomeprazole (NEXIUM) 40 MG capsule Take 1 capsule (40 mg total) by mouth 2 (two) times daily before a meal. 60 capsule 11  . estradiol (ESTRACE) 1 MG tablet Take 1 tablet (1 mg total) by mouth daily. 90 tablet 3  . FLUoxetine (PROZAC) 20 MG capsule Take 1 capsule (20 mg total) by mouth daily. 90 capsule 3  . fluticasone (FLONASE) 50 MCG/ACT nasal spray Place 2 sprays into both nostrils daily. 16 g 2  . levothyroxine (SYNTHROID, LEVOTHROID) 25 MCG tablet TAKE 1 TABLET (25 MCG TOTAL) BY MOUTH DAILY BEFORE BREAKFAST. 90 tablet 1  . Meperidine HCl (DEMEROL PO) Take by mouth as needed. Takes for headaches unsure dose    . Multiple Vitamin (MULTIVITAMIN WITH MINERALS) TABS tablet Take 1 tablet by mouth daily.    . Omega-3 Fatty Acids (SALMON OIL-1000 PO) Take 1,000 mg by mouth daily.    . potassium chloride (KLOR-CON 10) 10 MEQ tablet Take 1 tablet (10 mEq total) by mouth daily. 90  tablet 2  . topiramate (TOPAMAX) 50 MG tablet Take 50-250 mg by mouth 2 (two) times daily. Take 50mg  (1 tablet) daily in the morning.  Take 250mg  (5 tablets) daily at bedtime    . triamcinolone cream (KENALOG) 0.5 % Apply topically 2 (two) times daily. As needed for rash 30 g 0  . triamterene-hydrochlorothiazide (MAXZIDE-25) 37.5-25 MG per tablet Take 1 tablet by mouth daily. 30 tablet  11  . TURMERIC PO Take by mouth.     No current facility-administered medications on file prior to visit.   Review of Systems  All otherwise neg per pt      Objective:   Physical Exam BP 136/86 mmHg  Pulse 98  Temp(Src) 98.4 F (36.9 C) (Oral)  Resp 16  Wt 146 lb (66.225 kg)  SpO2 97% VS noted,  Constitutional: Pt appears in no significant distress HENT: Head: NCAT.  Right Ear: External ear normal.  Left Ear: External ear normal.  Eyes: . Pupils are equal, round, and reactive to light. Conjunctivae and EOM are normal Neck: Normal range of motion. Neck supple.  Cardiovascular: Normal rate and regular rhythm.   Pulmonary/Chest: Effort normal and breath sounds without rales or wheezing.  Abd:  Soft, NT, ND, + BS Neurological: Pt is alert. Not confused , motor grossly intact Skin: Skin is warm. No rash, no LE edema Psychiatric: Pt behavior is normal. No agitation.  Diffuse tender areas to upper and lower back    Assessment & Plan:

## 2015-03-26 NOTE — Patient Instructions (Signed)
Please continue all other medications as before, and refills have been done if requested.  Please have the pharmacy call with any other refills you may need.  Please keep your appointments with your specialists as you may have planned    

## 2015-03-26 NOTE — Progress Notes (Signed)
Pre visit review using our clinic review tool, if applicable. No additional management support is needed unless otherwise documented below in the visit note. 

## 2015-03-27 ENCOUNTER — Encounter: Payer: Self-pay | Admitting: Gastroenterology

## 2015-03-31 NOTE — Assessment & Plan Note (Signed)
Chronic stable, for med refill - tramadol and zofran prn,  to f/u any worsening symptoms or concerns

## 2015-04-02 ENCOUNTER — Other Ambulatory Visit: Payer: Self-pay | Admitting: Internal Medicine

## 2015-04-09 ENCOUNTER — Ambulatory Visit (INDEPENDENT_AMBULATORY_CARE_PROVIDER_SITE_OTHER): Payer: Medicare Other | Admitting: Internal Medicine

## 2015-04-09 ENCOUNTER — Encounter: Payer: Self-pay | Admitting: Internal Medicine

## 2015-04-09 VITALS — BP 120/76 | HR 90 | Ht 60.0 in | Wt 149.4 lb

## 2015-04-09 DIAGNOSIS — R05 Cough: Secondary | ICD-10-CM | POA: Diagnosis not present

## 2015-04-09 DIAGNOSIS — R059 Cough, unspecified: Secondary | ICD-10-CM

## 2015-04-09 DIAGNOSIS — J310 Chronic rhinitis: Secondary | ICD-10-CM

## 2015-04-09 NOTE — Assessment & Plan Note (Signed)
Chronic cough probably with reflux and upper airway irritability components, but not allergic. She doesn't want to try additional interventions and is satisfied to continue with occasional use of benzonatate for now.

## 2015-04-09 NOTE — Assessment & Plan Note (Addendum)
Currently well controlled. Pattern has not been specific and allergy profiles do not support an allergic diagnosis at this time. Plan-managed symptomatically and watch for specific triggers

## 2015-04-09 NOTE — Progress Notes (Signed)
01/07/15- 74 yoF never smoker referred courtesy of Dr Jenny Reichmann for allergy consultation . Husband is here  Specifically she is concerned about nonproductive cough present 5 years, worse lying down. No specific onset. She denies seasonal or exposure exacerbation and does not wheeze. Tried Delsym cough syrup. Tried albuterol inhaler "never learned to use it". Zyrtec helps nasal drainage but does not help cough. Not much postnasal drip. Cough is worse lying down at night. GERD fairly well controlled on twice daily Nexium 40 mg with occasional breakthrough. Occasional choking while eating. Pills may hang up mid esophagus. She states she stays on the sofa because of fibromyalgia. She and husband sleep with head of bed elevated. Older house has partial basement with sump pump in the basement and no recognized mold. 2 dogs. No smokers. Allergy vaccine as a young woman with Dr. Rayetta Pigg  04/09/15- 74 year old female never smoker followed for cough, rhinitis with question of allergy basis, complicated by GERD, fibromyalgia           husband here   FOLLOWS FOR: Pt states she has noticed increase in cough since last visit; starting to happen when she wakes up-productive at times-slight color; PND. She has been pleased with effectiveness of Tessalon pearls which stopped her nighttime cough. Subsequently aware of some increased cough in the daytime and she gets up and moves around. Denies choking when she eats. Scant sputum is usually white. Feels as if coughed triggers in her throat. We discussed upper airway nerve irritability etiology for cough. She was on gabapentin in the past for something she can't remember and she cannot remember why she stopped it, so she is reluctant to try it again. CXR clear 09/19/14 Allergy profiles 01/07/15-negative for specific allergen IgE elevations, total IgE 15 CRP-normal Sed rate-43-nonspecific  ROS-see HPI   Negative unless "+" Constitutional:    weight loss, night sweats, fevers,  chills, fatigue, lassitude. HEENT:   + headaches, difficulty swallowing, tooth/dental problems, sore throat,       +sneezing, itching, ear ache, nasal congestion, post nasal drip, snoring CV:    chest pain, orthopnea, PND, swelling in lower extremities, anasarca,                                                      dizziness, palpitations Resp:   shortness of breath with exertion or at rest.                productive cough,   +non-productive cough, coughing up of blood.              change in color of mucus.  wheezing.   Skin:    rash or lesions. GI:  +heartburn, indigestion, abdominal pain, nausea, vomiting,  GU: . MS:   +joint pain, stiffness, Neuro-     nothing unusual Psych:  change in mood or affect.  depression or anxiety.   memory loss.  OBJ- Physical Exam General- Alert, Oriented, Affect-appropriate, Distress- none acute Skin- rash-none, lesions- none, excoriation- none Lymphadenopathy- none Head- atraumatic            Eyes- Gross vision intact, PERRLA, conjunctivae and secretions clear            Ears- Hearing, canals-normal            Nose- Clear, no-Septal dev, mucus, polyps, erosion, perforation  Throat- Mallampati II , mucosa clear , drainage- none, tonsils- atrophic Neck- flexible , trachea midline, no stridor , thyroid nl, carotid no bruit Chest - symmetrical excursion , unlabored           Heart/CV- RRR , no murmur , no gallop  , no rub, nl s1 s2                           - JVD- none , edema- none, stasis changes- none, varices- none           Lung- clear to P&A, wheeze- none, cough + minor/dry , dullness-none, rub- none           Chest wall-  Abd-  Br/ Gen/ Rectal- Not done, not indicated Extrem- cyanosis- none, clubbing, none, atrophy- none, strength- nl Neuro- grossly intact to observation

## 2015-04-09 NOTE — Patient Instructions (Signed)
Ok to use benzonatate if needed for cough as directed  Please call as needed

## 2015-04-10 ENCOUNTER — Encounter: Payer: Self-pay | Admitting: Registered Nurse

## 2015-04-10 ENCOUNTER — Encounter: Payer: Medicare Other | Attending: Physical Medicine & Rehabilitation | Admitting: Registered Nurse

## 2015-04-10 VITALS — BP 146/91 | HR 105

## 2015-04-10 DIAGNOSIS — Z833 Family history of diabetes mellitus: Secondary | ICD-10-CM | POA: Diagnosis not present

## 2015-04-10 DIAGNOSIS — K589 Irritable bowel syndrome without diarrhea: Secondary | ICD-10-CM | POA: Insufficient documentation

## 2015-04-10 DIAGNOSIS — K219 Gastro-esophageal reflux disease without esophagitis: Secondary | ICD-10-CM | POA: Diagnosis not present

## 2015-04-10 DIAGNOSIS — M5030 Other cervical disc degeneration, unspecified cervical region: Secondary | ICD-10-CM | POA: Diagnosis not present

## 2015-04-10 DIAGNOSIS — M7062 Trochanteric bursitis, left hip: Secondary | ICD-10-CM

## 2015-04-10 DIAGNOSIS — M47816 Spondylosis without myelopathy or radiculopathy, lumbar region: Secondary | ICD-10-CM | POA: Diagnosis not present

## 2015-04-10 DIAGNOSIS — M797 Fibromyalgia: Secondary | ICD-10-CM

## 2015-04-10 DIAGNOSIS — M810 Age-related osteoporosis without current pathological fracture: Secondary | ICD-10-CM | POA: Diagnosis not present

## 2015-04-10 DIAGNOSIS — M7061 Trochanteric bursitis, right hip: Secondary | ICD-10-CM

## 2015-04-10 DIAGNOSIS — G894 Chronic pain syndrome: Secondary | ICD-10-CM | POA: Diagnosis not present

## 2015-04-10 DIAGNOSIS — M549 Dorsalgia, unspecified: Secondary | ICD-10-CM | POA: Diagnosis not present

## 2015-04-10 DIAGNOSIS — M791 Myalgia: Secondary | ICD-10-CM | POA: Diagnosis present

## 2015-04-10 DIAGNOSIS — Z8249 Family history of ischemic heart disease and other diseases of the circulatory system: Secondary | ICD-10-CM | POA: Diagnosis not present

## 2015-04-10 DIAGNOSIS — Z800 Family history of malignant neoplasm of digestive organs: Secondary | ICD-10-CM | POA: Diagnosis not present

## 2015-04-10 DIAGNOSIS — Z5181 Encounter for therapeutic drug level monitoring: Secondary | ICD-10-CM

## 2015-04-10 DIAGNOSIS — M542 Cervicalgia: Secondary | ICD-10-CM | POA: Insufficient documentation

## 2015-04-10 DIAGNOSIS — E785 Hyperlipidemia, unspecified: Secondary | ICD-10-CM | POA: Insufficient documentation

## 2015-04-10 MED ORDER — ACETAMINOPHEN-CODEINE #3 300-30 MG PO TABS: 1.0000 | ORAL_TABLET | Freq: Three times a day (TID) | ORAL | Status: DC | PRN

## 2015-04-10 NOTE — Progress Notes (Signed)
Subjective:    Patient ID: Laura Barnes, female    DOB: [birth year], 74 y.o.   MRN: [id number]  HPI: [name] is a 74 year old female who returns for follow up and medication refill. She says her pain is located in her neck, bilateral shoulders and bilateral hips. She rates her pain 4. Her current exercise regime is walking short distances. UDS ordered today and Narcotic Contract Reviewed she verbalizes understanding. She didn't bring her Tylenol #3 with her according to [facility] Tylenol #3 #90 picked up on 03/12/2015.   Pain Inventory Average Pain NA Pain Right Now 4 My pain is aching  In the last 24 hours, has pain interfered with the following? General activity 8 Relation with others 8 Enjoyment of life 8 What TIME of day is your pain at its worst? Morning, Daytime, Evening and Night Sleep (in general) Fair  Pain is worse with: standing and some activites Pain improves with: rest, heat/ice, medication and injections Relief from Meds: NA  Mobility walk without assistance how many minutes can you walk? 5-15 ability to climb steps?  yes do you drive?  yes  Function retired  Neuro/Psych No problems in this area  Prior Studies Any changes since last visit?  no  Physicians involved in your care Any changes since last visit?  no   Family History  Problem Relation Age of Onset  . Diabetes Mother   . Diabetes Sister   . Breast cancer Maternal Grandmother   . Heart disease Maternal Uncle     x 2  . Diabetes Other     multi relatives on father side  . Heart disease Maternal Aunt   . Kidney disease Paternal Uncle     questionable  . Colon cancer Neg Hx   . Colon polyps Neg Hx   . Rectal cancer Neg Hx   . Stomach cancer Neg Hx   . Heart disease Father    Social History   Social History  . Marital Status: Married    Spouse Name: N/A  . Number of Children: 2  . Years of Education: N/A   Occupational History  . retired Press photographer    Social  History Main Topics  . Smoking status: Never Smoker   . Smokeless tobacco: Never Used  . Alcohol Use: No  . Drug Use: No  . Sexual Activity: Not Asked   Other Topics Concern  . None   Social History Narrative   Has 2 biological children and 1 step child   Past Surgical History  Procedure Laterality Date  . Appendectomy    . Cholecystectomy    . 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 Oophorectomy    . Shoulder surgery    . S/p neck lump removal    . Breast enhancement surgery    . Vaginal hysterectomy    . Tonsillectomy    . Colonoscopy    . Sinus endo w/fusion Right 06/30/2013    Procedure: RIGHT ENDOSCOPIC SPHENOIDECTOMY WITH FUSION SCAN;  Surgeon: [name], MD;  Location: [facility];  Service: ENT;  Laterality: Right;   Past Medical History  Diagnosis Date  . HYPERLIPIDEMIA   . ANXIETY   . DEPRESSION   . MIGRAINE, COMMON   . MIGRAINE HEADACHE   . VENOUS INSUFFICIENCY, CHRONIC   . SINUSITIS- ACUTE-NOS   . ALLERGIC RHINITIS   . GERD   . IBS   . VAGINITIS   .  SKIN LESION   . Enthesopathy of hip region   . FOOT PAIN, BILATERAL   . PLANTAR FASCIITIS   . OSTEOPOROSIS   . OSTEOPENIA   . RASH-NONVESICULAR   . NECK MASS   . Hiatal hernia   . Hypothyroidism   . Bronchitis   . Anemia   . Complication of anesthesia     says one time waking up she couldn't breathe, felt like her throat closing up  . Family history of anesthesia complication     sister with n/v   BP 146/91 mmHg  Pulse 105  SpO2 97%  Opioid Risk Score:   Fall Risk Score:  `1  Depression screen PHQ 2/9  Depression screen [facility] 2/9 01/29/2015 12/17/2014 05/01/2014  Decreased Interest 0 0 0  Down, Depressed, Hopeless 0 0 0  PHQ - 2 Score 0 0 0  Altered sleeping - 0 -  Tired, decreased energy - 0 -  Change in appetite - 0 -  Feeling bad or failure about yourself  - 0 -  Trouble concentrating - 0 -  Moving slowly or fidgety/restless - 0 -  Suicidal thoughts - 0 -    PHQ-9 Score - 0 -     Review of Systems  Respiratory: Positive for cough.   Gastrointestinal: Positive for nausea, abdominal pain and constipation.  All other systems reviewed and are negative.      Objective:   Physical Exam  Constitutional: She is oriented to person, place, and time. She appears well-developed and well-nourished.  HENT:  Head: Normocephalic and atraumatic.  Neck: Normal range of motion. Neck supple.  Cardiovascular: Normal rate and regular rhythm.   Pulmonary/Chest: Effort normal and breath sounds normal.  Musculoskeletal:  Normal Muscle Bulk and Muscle Testing Reveals: Upper Extremities: Right: Full ROM and Muscle Strength 5/5 Left: Decreased ROM 90 Degrees and Muscle Strength 5/5 Thoracic and Lumbar Hypersensitivity Lower Extremities: Full ROM and Muscle Strength 5/5 Arises from chair with ease Narrow Based Gait  Neurological: She is alert and oriented to person, place, and time.  Skin: Skin is warm and dry.  Psychiatric: She has a normal mood and affect.  Nursing note and vitals reviewed.         Assessment & Plan:  1.Fibromyalgia syndrome with widespread body pain. Continue with HEP. 2. Cervical spondylosis, severe, lumbar spondylosis moderate. Refilled:  Tylenol No. 3 one tablet 3 times per day #90. 3. Bilateral Greater Trochanteric Bursitis: Schedule Cortisone Injection with Dr. Letta Pate  30 minutes of face to face patient care time was spent during this visit. All questions were encouraged and answered.  F/U in 1 month

## 2015-04-11 ENCOUNTER — Other Ambulatory Visit: Payer: Self-pay | Admitting: Registered Nurse

## 2015-04-11 DIAGNOSIS — Z79899 Other long term (current) drug therapy: Secondary | ICD-10-CM | POA: Diagnosis not present

## 2015-04-11 DIAGNOSIS — Z5181 Encounter for therapeutic drug level monitoring: Secondary | ICD-10-CM | POA: Diagnosis not present

## 2015-04-11 DIAGNOSIS — G894 Chronic pain syndrome: Secondary | ICD-10-CM | POA: Diagnosis not present

## 2015-04-11 NOTE — Addendum Note (Signed)
Addended by: Gerald Leitz A on: 04/11/2015 01:58 PM   Modules accepted: Orders

## 2015-04-12 LAB — PMP ALCOHOL METABOLITE (ETG): Ethyl Glucuronide (EtG): NEGATIVE ng/mL

## 2015-04-16 LAB — OPIATES/OPIOIDS (LC/MS-MS)
Codeine Urine: 3072 ng/mL — AB (ref <50)
Hydrocodone: NEGATIVE ng/mL (ref <50)
Hydromorphone: NEGATIVE ng/mL (ref <50)
Morphine Urine: 59 ng/mL — AB (ref <50)
Norhydrocodone, Ur: NEGATIVE ng/mL (ref <50)
Noroxycodone, Ur: NEGATIVE ng/mL (ref <50)
Oxycodone, ur: NEGATIVE ng/mL (ref <50)
Oxymorphone: NEGATIVE ng/mL (ref <50)

## 2015-04-16 LAB — PRESCRIPTION MONITORING PROFILE (SOLSTAS)
Amphetamine/Meth: NEGATIVE ng/mL
Barbiturate Screen, Urine: NEGATIVE ng/mL
Buprenorphine, Urine: NEGATIVE ng/mL
Cannabinoid Scrn, Ur: NEGATIVE ng/mL
Carisoprodol, Urine: NEGATIVE ng/mL
Cocaine Metabolites: NEGATIVE ng/mL
Creatinine, Urine: 73.83 mg/dL (ref >20.0)
Fentanyl, Ur: NEGATIVE ng/mL
MDMA URINE: NEGATIVE ng/mL
Meperidine, Ur: NEGATIVE ng/mL
Methadone Screen, Urine: NEGATIVE ng/mL
Nitrites, Initial: NEGATIVE ug/mL
Oxycodone Screen, Ur: NEGATIVE ng/mL
Propoxyphene: NEGATIVE ng/mL
Tapentadol, urine: NEGATIVE ng/mL
Zolpidem, Urine: NEGATIVE ng/mL
pH, Initial: 7 pH (ref 4.5–8.9)

## 2015-04-16 LAB — BENZODIAZEPINES (GC/LC/MS), URINE
Alprazolam metabolite (GC/LC/MS), ur confirm: 105 ng/mL — AB (ref <25)
Clonazepam metabolite (GC/LC/MS), ur confirm: NEGATIVE ng/mL (ref <25)
Flurazepam metabolite (GC/LC/MS), ur confirm: NEGATIVE ng/mL (ref <50)
Lorazepam (GC/LC/MS), ur confirm: NEGATIVE ng/mL (ref <50)
Midazolam (GC/LC/MS), ur confirm: NEGATIVE ng/mL (ref <50)
Nordiazepam (GC/LC/MS), ur confirm: NEGATIVE ng/mL (ref <50)
Oxazepam (GC/LC/MS), ur confirm: NEGATIVE ng/mL (ref <50)
Temazepam (GC/LC/MS), ur confirm: NEGATIVE ng/mL (ref <50)
Triazolam metabolite (GC/LC/MS), ur confirm: NEGATIVE ng/mL (ref <50)

## 2015-04-16 LAB — TRAMADOL, URINE
N-DESMETHYL-CIS-TRAMADOL: NEGATIVE ng/mL (ref <100)
Tramadol, Urine: NEGATIVE ng/mL (ref <100)

## 2015-04-18 NOTE — Progress Notes (Signed)
Urine drug screen for this encounter is consistent for prescribed medication 

## 2015-05-04 ENCOUNTER — Other Ambulatory Visit: Payer: Self-pay | Admitting: Family Medicine

## 2015-05-06 ENCOUNTER — Other Ambulatory Visit: Payer: Self-pay | Admitting: Internal Medicine

## 2015-05-06 NOTE — Telephone Encounter (Signed)
Refill done.  

## 2015-05-07 ENCOUNTER — Ambulatory Visit (INDEPENDENT_AMBULATORY_CARE_PROVIDER_SITE_OTHER): Payer: Medicare Other | Admitting: Internal Medicine

## 2015-05-07 VITALS — BP 126/88 | HR 101 | Temp 98.3°F | Resp 16 | Wt 148.0 lb

## 2015-05-07 DIAGNOSIS — R1033 Periumbilical pain: Secondary | ICD-10-CM

## 2015-05-07 DIAGNOSIS — E039 Hypothyroidism, unspecified: Secondary | ICD-10-CM

## 2015-05-07 DIAGNOSIS — K219 Gastro-esophageal reflux disease without esophagitis: Secondary | ICD-10-CM | POA: Diagnosis not present

## 2015-05-07 DIAGNOSIS — R600 Localized edema: Secondary | ICD-10-CM | POA: Insufficient documentation

## 2015-05-07 DIAGNOSIS — M797 Fibromyalgia: Secondary | ICD-10-CM

## 2015-05-07 NOTE — Progress Notes (Signed)
Subjective:    Patient ID: Laura Barnes, female    DOB: 12/28/1940, 74 y.o.   MRN: [id number]  HPI She is here to establish with a new pcp.  She follows with pain management, neuro, pulmonary.  Right ear: It initially felt like there was water in her ear.  Her ear popped and crackled.  She cleaned her ears and there was no change.  It stayed this way for several weeks.  It currently feels ok.  Sometimes it hurts a little.  She does have some allergies, but takes zyrtec daily.  She denies any cold symptoms.    Cough:  She has a dry, chronic cough. She sees pulmonary.  There is no known reason for the cough.  She uses tessalon perles as needed.  Pain around umbilicus:  Her pain is intermittent.  It started about 3 weeks ago.  No relation to food.  No radiation.  No change in BM.  She did feel a lump there at one point.  She denies any thing that causes the pain or makes it worse.   Diarrhea since sat.  She started having diarrhea three days ago.  She has it several times a day.  Stopped laxative, but is still taking a stool softener.  No blood in stool.   No travel. No antibiotics.  No change in meds/supplements. She has some mild diffuse abdominal tenderness, but the diarrhea is not related to the umbilical pain.  She has a history of IBS, but this is different.   Chronic pain, fibromyalgia:  She follows at the pain clinic.  She is taking tylenol #3 three times a day.  When her fibromyalgia flares she just rests.  She gets flares several times a month and it can last 1 day - one week.    Migraine headaches:  She gets migraine headaches from the fibromyalgia, but can get them from other triggers. She takes demerol for the migraines - its the only thing that works.  The migraines can last up to a few days.  She rests when she has a migraine.  She follows with neurology.    Hypothyroidism:  She is taking her medication daily.    Anxiety, depression:  She takes prozac daily.  Her  depression and anxiety are controlled.   Fluid retention:  Takes a fluid pill daily. She has been on a fluid pill for years. She denies a history of htn.   GERD:  She is taking her medication daily as prescribed.  She denies any GERD symptoms and feels her GERD is well controlled.    Medications and allergies reviewed with patient and updated if appropriate.  Patient Active Problem List   Diagnosis Date Noted  . 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 Trochanteric bursitis of both hips 03/12/2015  . Subacromial impingement of left shoulder 03/12/2015  . Breast implant removal status 05/01/2014  . Hyperlipemia 05/01/2014  . Chronic pain syndrome 05/01/2014  . Lumbar radiculopathy 03/27/2014  . Left shoulder pain 11/07/2013  . Degenerative cervical disc 10/10/2013  . Fibromyalgia 09/06/2013  . Bilateral shoulder pain 09/06/2013  . Urinary frequency 08/12/2013  . Low back pain 08/12/2013  . Cough 04/12/2013  . Sinusitis, chronic 04/12/2013  . Hot flashes 11/15/2012  . Hypothyroidism 04/07/2012  . Facial pain 10/01/2010  . Preventative health care 09/28/2010  . VENOUS INSUFFICIENCY, CHRONIC 04/02/2010  . FOOT PAIN, BILATERAL 04/02/2010  . Enthesopathy of hip region 07/19/2007  .  OSTEOPOROSIS 07/19/2007  . HYPERLIPIDEMIA 01/23/2007  . ANXIETY 01/23/2007  . Depression 01/23/2007  . Migraine 01/23/2007  . NINAR (noninfectious nonallergic rhinitis) 01/23/2007  . GERD 01/23/2007  . IBS 01/23/2007    Current Outpatient Prescriptions on File Prior to Visit  Medication Sig Dispense Refill  . acetaminophen-codeine (TYLENOL #3) 300-30 MG tablet Take 1 tablet by mouth every 8 (eight) hours as needed for moderate pain. 90 tablet 0  . ALPRAZolam (XANAX) 0.5 MG tablet Take 1 tablet (0.5 mg total) by mouth 2 (two) times daily. 60 tablet 2  . Amino Acids (AMINO ACID PO) Take 1 capsule by mouth 2 (two) times daily.    . B Complex-Biotin-FA (B  COMPLETE) TABS Take 1 tablet by mouth daily. B Complete 100mg     . baclofen (LIORESAL) 10 MG tablet TAKE 1 TABLET BY MOUTH THREE TIMES DAILY 270 tablet 0  . benzonatate (TESSALON) 200 MG capsule TAKE 1 CAPSULE(200 MG) BY MOUTH THREE TIMES DAILY AS NEEDED FOR COUGH 30 capsule 0  . buPROPion (WELLBUTRIN XL) 300 MG 24 hr tablet TAKE 1 TABLET (300 MG TOTAL) BY MOUTH DAILY. 90 tablet 3  . calcium carbonate (OS-CAL) 600 MG TABS tablet Take 600 mg by mouth 2 (two) times daily with a meal.     . cetirizine (ZYRTEC) 10 MG tablet TAKE 1 TABLET (10 MG TOTAL) BY MOUTH DAILY. 30 tablet 11  . cholecalciferol (VITAMIN D) 1000 UNITS tablet Take 1,000 Units by mouth daily.    . clidinium-chlordiazePOXIDE (LIBRAX) 5-2.5 MG per capsule Take 1 capsule by mouth daily as needed (for stomach). 60 capsule 4  . Coenzyme Q10 (CO Q-10) 200 MG CAPS Take 1 capsule by mouth 2 (two) times daily.    Marland Kitchen esomeprazole (NEXIUM) 40 MG capsule Take 1 capsule (40 mg total) by mouth 2 (two) times daily before a meal. 60 capsule 11  . estradiol (ESTRACE) 1 MG tablet Take 1 tablet (1 mg total) by mouth daily. 90 tablet 3  . FLUoxetine (PROZAC) 20 MG capsule Take 1 capsule (20 mg total) by mouth daily. 90 capsule 3  . fluticasone (FLONASE) 50 MCG/ACT nasal spray Place 2 sprays into both nostrils daily. 16 g 2  . KLOR-CON 10 10 MEQ tablet TAKE 1 TABLET (10 MEQ TOTAL) BY MOUTH DAILY. 90 tablet 2  . levothyroxine (SYNTHROID, LEVOTHROID) 25 MCG tablet TAKE 1 TABLET (25 MCG TOTAL) BY MOUTH DAILY BEFORE BREAKFAST. 90 tablet 1  . Meperidine HCl (DEMEROL PO) Take by mouth as needed. Takes for headaches unsure dose    . Multiple Vitamin (MULTIVITAMIN WITH MINERALS) TABS tablet Take 1 tablet by mouth daily.    . Omega-3 Fatty Acids (SALMON OIL-1000 PO) Take 1,000 mg by mouth daily.    . ondansetron (ZOFRAN) 4 MG tablet Take 1 tablet (4 mg total) by mouth every 8 (eight) hours as needed for nausea or vomiting. 40 tablet 1  . topiramate (TOPAMAX) 50 MG  tablet Take 50-250 mg by mouth 2 (two) times daily. Take 50mg  (1 tablet) daily in the morning.  Take 250mg  (5 tablets) daily at bedtime    . triamcinolone cream (KENALOG) 0.5 % Apply topically 2 (two) times daily. As needed for rash 30 g 0  . triamterene-hydrochlorothiazide (MAXZIDE-25) 37.5-25 MG per tablet Take 1 tablet by mouth daily. 30 tablet 11  . TURMERIC PO Take by mouth.     No current facility-administered medications on file prior to visit.    Past Medical History  Diagnosis Date  .  HYPERLIPIDEMIA   . ANXIETY   . DEPRESSION   . MIGRAINE, COMMON   . MIGRAINE HEADACHE   . VENOUS INSUFFICIENCY, CHRONIC   . SINUSITIS- ACUTE-NOS   . ALLERGIC RHINITIS   . GERD   . IBS   . VAGINITIS   . SKIN LESION   . Enthesopathy of hip region   . FOOT PAIN, BILATERAL   . PLANTAR FASCIITIS   . OSTEOPOROSIS   . OSTEOPENIA   . RASH-NONVESICULAR   . NECK MASS   . Hiatal hernia   . Hypothyroidism   . Bronchitis   . Anemia   . Complication of anesthesia     says one time waking up she couldn't breathe, felt like her throat closing up  . Family history of anesthesia complication     sister with n/v    Past Surgical History  Procedure Laterality Date  . Appendectomy    . Cholecystectomy    . 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 Oophorectomy    . Shoulder surgery    . S/p neck lump removal    . Breast enhancement surgery    . Vaginal hysterectomy    . Tonsillectomy    . Colonoscopy    . Sinus endo w/fusion Right 06/30/2013    Procedure: RIGHT ENDOSCOPIC SPHENOIDECTOMY WITH FUSION SCAN;  Surgeon: [name], MD;  Location: [facility];  Service: ENT;  Laterality: Right;    Social History   Social History  . Marital Status: Married    Spouse Name: N/A  . Number of Children: 2  . Years of Education: N/A   Occupational History  . retired [employer] photographer    Social History Main Topics  . Smoking status: Never Smoker   . Smokeless tobacco: Never  Used  . Alcohol Use: No  . Drug Use: No  . Sexual Activity: Not on file   Other Topics Concern  . Not on file   Social History Narrative   Has 2 biological children and 1 step child    Review of Systems  Constitutional: Negative for fever, chills and appetite change (gets full faster).  HENT: Positive for sore throat (occasional). Negative for congestion.   Respiratory: Positive for cough. Negative for shortness of breath and wheezing.   Cardiovascular: Negative for chest pain.  Gastrointestinal: Positive for nausea (chronic), abdominal pain and diarrhea (x 3 days). Negative for blood in stool.       GERD - occasional symptoms  Endocrine: Positive for cold intolerance.  Musculoskeletal: Positive for myalgias, back pain and arthralgias (wrists, hands).  Neurological: Positive for numbness (occasional, hands) and headaches. Negative for dizziness and light-headedness.       Objective:   Filed Vitals:   05/07/15 1411  BP: 126/88  Pulse: 101  Temp: 98.3 F (36.8 C)  Resp: 16   Filed Weights   05/07/15 1411  Weight: 148 lb (67.132 kg)   Body mass index is 28.9 kg/(m^2).   Physical Exam  Constitutional: She appears well-developed and well-nourished.  HENT:  Head: Normocephalic and atraumatic.  Right Ear: External ear normal.  Left Ear: External ear normal.  Right ear canal with excessive cerumen, left ear canal with minimal cerumen  Neck: Neck supple. No tracheal deviation present. No thyromegaly present.  No carotid bruit  Cardiovascular: Normal rate, regular rhythm and normal heart sounds.   No murmur heard. Pulmonary/Chest: Effort normal and breath sounds normal. No respiratory distress. She has no wheezes. She  has no rales.  Musculoskeletal: She exhibits edema (mild ).  Lymphadenopathy:    She has no cervical adenopathy.  Psychiatric: She has a normal mood and affect. Her behavior is normal.          Assessment & Plan:   Ear symptoms Likely related to  excessive wax or possibly allergies Declined ear lavage today -- will try to clean ear out at home Return if needed  Diarrhea Only for a few days No obvious cause Unlikely bacterial, but if no improvement will need stool cultures Stop stool softener (she is still taking) If no improvement she will call   See Problem List for further A.P  Follow up in 6 months  See problem list for assessment and plan

## 2015-05-07 NOTE — Patient Instructions (Signed)
  We have reviewed your prior records including labs and tests today.  Test(s) ordered today. Your results will be released to [facility] (or called to you) after review, usually within 72hours after test completion. If any changes need to be made, you will be notified at that same time.  All other Health Maintenance issues reviewed.  No immunizations administered today.   Medications reviewed and updated.  No changes recommended at this time.  Your ear was clean out today  Let me know if your diarrhea does not improve after stopping the stool softener.   Please schedule followup in 6 months for a physical.

## 2015-05-08 ENCOUNTER — Encounter: Payer: Self-pay | Admitting: Internal Medicine

## 2015-05-08 ENCOUNTER — Other Ambulatory Visit (INDEPENDENT_AMBULATORY_CARE_PROVIDER_SITE_OTHER): Payer: Medicare Other

## 2015-05-08 ENCOUNTER — Telehealth: Payer: Self-pay | Admitting: Emergency Medicine

## 2015-05-08 DIAGNOSIS — M797 Fibromyalgia: Secondary | ICD-10-CM | POA: Diagnosis not present

## 2015-05-08 DIAGNOSIS — Z139 Encounter for screening, unspecified: Secondary | ICD-10-CM

## 2015-05-08 DIAGNOSIS — R1033 Periumbilical pain: Secondary | ICD-10-CM | POA: Insufficient documentation

## 2015-05-08 DIAGNOSIS — E039 Hypothyroidism, unspecified: Secondary | ICD-10-CM

## 2015-05-08 DIAGNOSIS — R600 Localized edema: Secondary | ICD-10-CM

## 2015-05-08 DIAGNOSIS — K219 Gastro-esophageal reflux disease without esophagitis: Secondary | ICD-10-CM | POA: Diagnosis not present

## 2015-05-08 DIAGNOSIS — R350 Frequency of micturition: Secondary | ICD-10-CM

## 2015-05-08 LAB — CBC WITH DIFFERENTIAL/PLATELET
Basophils Absolute: 0 10*3/uL (ref 0.0–0.1)
Basophils Relative: 0.3 % (ref 0.0–3.0)
Eosinophils Absolute: 0.3 10*3/uL (ref 0.0–0.7)
Eosinophils Relative: 2.3 % (ref 0.0–5.0)
HCT: 40.9 % (ref 36.0–46.0)
Hemoglobin: 13.9 g/dL (ref 12.0–15.0)
Lymphocytes Relative: 27.7 % (ref 12.0–46.0)
Lymphs Abs: 3.1 10*3/uL (ref 0.7–4.0)
MCHC: 33.9 g/dL (ref 30.0–36.0)
MCV: 95.8 fl (ref 78.0–100.0)
Monocytes Absolute: 1 10*3/uL (ref 0.1–1.0)
Monocytes Relative: 9.4 % (ref 3.0–12.0)
Neutro Abs: 6.7 10*3/uL (ref 1.4–7.7)
Neutrophils Relative %: 60.3 % (ref 43.0–77.0)
Platelets: 288 10*3/uL (ref 150.0–400.0)
RBC: 4.27 Mil/uL (ref 3.87–5.11)
RDW: 14.4 % (ref 11.5–15.5)
WBC: 11.1 10*3/uL — ABNORMAL HIGH (ref 4.0–10.5)

## 2015-05-08 LAB — URINALYSIS, ROUTINE W REFLEX MICROSCOPIC
Bilirubin Urine: NEGATIVE
Hgb urine dipstick: NEGATIVE
Ketones, ur: NEGATIVE
Leukocytes, UA: NEGATIVE
Nitrite: NEGATIVE
Specific Gravity, Urine: 1.01 (ref 1.000–1.030)
Total Protein, Urine: NEGATIVE
Urine Glucose: NEGATIVE
Urobilinogen, UA: 0.2 (ref 0.0–1.0)
pH: 6.5 (ref 5.0–8.0)

## 2015-05-08 LAB — LIPID PANEL
Cholesterol: 195 mg/dL (ref 0–200)
HDL: 78.5 mg/dL (ref >39.00)
LDL Cholesterol: 96 mg/dL (ref 0–99)
NonHDL: 116.08
Total CHOL/HDL Ratio: 2
Triglycerides: 99 mg/dL (ref 0.0–149.0)
VLDL: 19.8 mg/dL (ref 0.0–40.0)

## 2015-05-08 LAB — COMPREHENSIVE METABOLIC PANEL
ALT: 23 U/L (ref 0–35)
AST: 26 U/L (ref 0–37)
Albumin: 4 g/dL (ref 3.5–5.2)
Alkaline Phosphatase: 43 U/L (ref 39–117)
BUN: 12 mg/dL (ref 6–23)
CO2: 28 mEq/L (ref 19–32)
Calcium: 9.7 mg/dL (ref 8.4–10.5)
Chloride: 101 mEq/L (ref 96–112)
Creatinine, Ser: 0.97 mg/dL (ref 0.40–1.20)
GFR: 59.61 mL/min — ABNORMAL LOW (ref >60.00)
Glucose, Bld: 101 mg/dL — ABNORMAL HIGH (ref 70–99)
Potassium: 3.1 mEq/L — ABNORMAL LOW (ref 3.5–5.1)
Sodium: 139 mEq/L (ref 135–145)
Total Bilirubin: 0.4 mg/dL (ref 0.2–1.2)
Total Protein: 7.5 g/dL (ref 6.0–8.3)

## 2015-05-08 LAB — TSH: TSH: 3.39 u[IU]/mL (ref 0.35–4.50)

## 2015-05-08 NOTE — Assessment & Plan Note (Signed)
Following with pain management Taking tylenol #3, baclofen, prozac, wellbutrin Has not wanted to try lyrica, TCA

## 2015-05-08 NOTE — Telephone Encounter (Signed)
Pt is requesting that she have labs done with diabetes. She told lab she wanted a UA done. Please advise if this is necessary due to a family Hx of diabtese

## 2015-05-08 NOTE — Assessment & Plan Note (Signed)
Intermittent, likely incisional hernia Discussed options - for now just monitor, but if pain increases may need a surgical intervention

## 2015-05-08 NOTE — Telephone Encounter (Signed)
Both ordered.  Is she having urinary symptoms?

## 2015-05-08 NOTE — Assessment & Plan Note (Signed)
Taking nexium twice daily controlled

## 2015-05-08 NOTE — Assessment & Plan Note (Signed)
Taking maxzide daily Overall controlled Continue medication, check cmp

## 2015-05-08 NOTE — Assessment & Plan Note (Signed)
Check tsh Will titrate medication if needed

## 2015-05-09 ENCOUNTER — Encounter: Payer: Self-pay | Admitting: Internal Medicine

## 2015-05-09 ENCOUNTER — Other Ambulatory Visit (INDEPENDENT_AMBULATORY_CARE_PROVIDER_SITE_OTHER): Payer: Medicare Other

## 2015-05-09 DIAGNOSIS — Z139 Encounter for screening, unspecified: Secondary | ICD-10-CM

## 2015-05-09 LAB — HEMOGLOBIN A1C: Hgb A1c MFr Bld: 5.3 % (ref 4.6–6.5)

## 2015-05-09 LAB — URINE CULTURE: Colony Count: 15000

## 2015-05-12 ENCOUNTER — Encounter: Payer: Self-pay | Admitting: Internal Medicine

## 2015-05-14 ENCOUNTER — Encounter: Payer: Medicare Other | Attending: Physical Medicine & Rehabilitation

## 2015-05-14 ENCOUNTER — Ambulatory Visit (HOSPITAL_BASED_OUTPATIENT_CLINIC_OR_DEPARTMENT_OTHER): Payer: Medicare Other | Admitting: Physical Medicine & Rehabilitation

## 2015-05-14 ENCOUNTER — Encounter: Payer: Self-pay | Admitting: Physical Medicine & Rehabilitation

## 2015-05-14 VITALS — BP 141/86 | HR 92 | Resp 16

## 2015-05-14 DIAGNOSIS — M542 Cervicalgia: Secondary | ICD-10-CM | POA: Insufficient documentation

## 2015-05-14 DIAGNOSIS — Z8249 Family history of ischemic heart disease and other diseases of the circulatory system: Secondary | ICD-10-CM | POA: Diagnosis not present

## 2015-05-14 DIAGNOSIS — Z800 Family history of malignant neoplasm of digestive organs: Secondary | ICD-10-CM | POA: Insufficient documentation

## 2015-05-14 DIAGNOSIS — G894 Chronic pain syndrome: Secondary | ICD-10-CM | POA: Diagnosis not present

## 2015-05-14 DIAGNOSIS — M791 Myalgia: Secondary | ICD-10-CM | POA: Diagnosis present

## 2015-05-14 DIAGNOSIS — K589 Irritable bowel syndrome without diarrhea: Secondary | ICD-10-CM | POA: Diagnosis not present

## 2015-05-14 DIAGNOSIS — M7062 Trochanteric bursitis, left hip: Secondary | ICD-10-CM | POA: Diagnosis not present

## 2015-05-14 DIAGNOSIS — E785 Hyperlipidemia, unspecified: Secondary | ICD-10-CM | POA: Diagnosis not present

## 2015-05-14 DIAGNOSIS — M549 Dorsalgia, unspecified: Secondary | ICD-10-CM | POA: Insufficient documentation

## 2015-05-14 DIAGNOSIS — Z833 Family history of diabetes mellitus: Secondary | ICD-10-CM | POA: Insufficient documentation

## 2015-05-14 DIAGNOSIS — M810 Age-related osteoporosis without current pathological fracture: Secondary | ICD-10-CM | POA: Insufficient documentation

## 2015-05-14 DIAGNOSIS — K219 Gastro-esophageal reflux disease without esophagitis: Secondary | ICD-10-CM | POA: Insufficient documentation

## 2015-05-14 DIAGNOSIS — M7061 Trochanteric bursitis, right hip: Secondary | ICD-10-CM

## 2015-05-14 MED ORDER — ACETAMINOPHEN-CODEINE #4 300-60 MG PO TABS: 1.0000 | ORAL_TABLET | Freq: Three times a day (TID) | ORAL | Status: DC | PRN

## 2015-05-14 MED ORDER — ACETAMINOPHEN-CODEINE #4 300-60 MG PO TABS: 1.0000 | ORAL_TABLET | ORAL | Status: DC | PRN

## 2015-05-14 NOTE — Addendum Note (Signed)
Addended by: Charlett Blake on: 05/14/2015 02:28 PM   Modules accepted: Orders, Level of Service

## 2015-05-14 NOTE — Patient Instructions (Signed)
Trochanteric Bursitis You have hip pain due to trochanteric bursitis. Bursitis means that the sack near the outside of the hip is filled with fluid and inflamed. This sack is made up of protective soft tissue. The pain from trochanteric bursitis can be severe and keep you from sleep. It can radiate to the buttocks or down the outside of the thigh to the knee. The pain is almost always worse when rising from the seated or lying position and with walking. Pain can improve after you take a few steps. It happens more often in people with hip joint and lumbar spine problems, such as arthritis or previous surgery. Very rarely the trochanteric bursa can become infected, and antibiotics and/or surgery may be needed. Treatment often includes an injection of local anesthetic mixed with cortisone medicine. This medicine is injected into the area where it is most tender over the hip. Repeat injections may be necessary if the response to treatment is slow. You can apply ice packs over the tender area for 30 minutes every 2 hours for the next few days. Anti-inflammatory and/or narcotic pain medicine may also be helpful. Limit your activity for the next few days if the pain continues. See your caregiver in 5-10 days if you are not greatly improved.  SEEK IMMEDIATE MEDICAL CARE IF:  You develop severe pain, fever, or increased redness.  You have pain that radiates below the knee. EXERCISES STRETCHING EXERCISES - Trochanteric Bursitis  These exercises may help you when beginning to rehabilitate your injury. Your symptoms may resolve with or without further involvement from your physician, physical therapist, or athletic trainer. While completing these exercises, remember:   Restoring tissue flexibility helps normal motion to return to the joints. This allows healthier, less painful movement and activity.  An effective stretch should be held for at least 30 seconds.  A stretch should never be painful. You should only  feel a gentle lengthening or release in the stretched tissue. STRETCH - Iliotibial Band  On the floor or bed, lie on your side so your injured leg is on top. Bend your knee and grab your ankle.  Slowly bring your knee back so that your thigh is in line with your trunk. Keep your heel at your buttocks and gently arch your back so your head, shoulders and hips line up.  Slowly lower your leg so that your knee approaches the floor/bed until you feel a gentle stretch on the outside of your thigh. If you do not feel a stretch and your knee will not fall farther, place the heel of your opposite foot on top of your knee and pull your thigh down farther.  Hold this stretch for __________ seconds.  Repeat __________ times. Complete this exercise __________ times per day. STRETCH - Hamstrings, Supine   Lie on your back. Loop a belt or towel over the ball of your foot as shown.  Straighten your knee and slowly pull on the belt to raise your injured leg. Do not allow the knee to bend. Keep your opposite leg flat on the floor.  Raise the leg until you feel a gentle stretch behind your knee or thigh. Hold this position for __________ seconds.  Repeat __________ times. Complete this stretch __________ times per day. STRETCH - Quadriceps, Prone   Lie on your stomach on a firm surface, such as a bed or padded floor.  Bend your knee and grasp your ankle. If you are unable to reach your ankle or pant leg, use a belt   around your foot to lengthen your reach.  Gently pull your heel toward your buttocks. Your knee should not slide out to the side. You should feel a stretch in the front of your thigh and/or knee.  Hold this position for __________ seconds.  Repeat __________ times. Complete this stretch __________ times per day. STRETCHING - Hip Flexors, Lunge Half kneel with your knee on the floor and your opposite knee bent and directly over your ankle.  Keep good posture with your head over your  shoulders. Tighten your buttocks to point your tailbone downward; this will prevent your back from arching too much.  You should feel a gentle stretch in the front of your thigh and/or hip. If you do not feel any resistance, slightly slide your opposite foot forward and then slowly lunge forward so your knee once again lines up over your ankle. Be sure your tailbone remains pointed downward.  Hold this stretch for __________ seconds.  Repeat __________ times. Complete this stretch __________ times per day. STRETCH - Adductors, Lunge  While standing, spread your legs.  Lean away from your injured leg by bending your opposite knee. You may rest your hands on your thigh for balance.  You should feel a stretch in your inner thigh. Hold for __________ seconds.  Repeat __________ times. Complete this exercise __________ times per day.   This information is not intended to replace advice given to you by your health care provider. Make sure you discuss any questions you have with your health care provider.   Document Released: 06/25/2004 Document Revised: 10/02/2014 Document Reviewed: 08/30/2008 Elsevier Interactive Patient Education 2016 [employer]  

## 2015-06-04 ENCOUNTER — Other Ambulatory Visit: Payer: Self-pay | Admitting: Internal Medicine

## 2015-06-13 ENCOUNTER — Telehealth: Payer: Self-pay | Admitting: *Deleted

## 2015-06-13 NOTE — Telephone Encounter (Signed)
Laura Barnes called because she says we had called her.  I am sure it was a robo reminder call about her appt 06/18/15 with Dr Letta Pate. I let her know this.

## 2015-06-18 ENCOUNTER — Ambulatory Visit (HOSPITAL_BASED_OUTPATIENT_CLINIC_OR_DEPARTMENT_OTHER): Payer: Medicare Other | Admitting: Physical Medicine & Rehabilitation

## 2015-06-18 ENCOUNTER — Encounter: Payer: Self-pay | Admitting: Physical Medicine & Rehabilitation

## 2015-06-18 ENCOUNTER — Encounter: Payer: Medicare Other | Attending: Physical Medicine & Rehabilitation

## 2015-06-18 VITALS — BP 143/80 | HR 95 | Resp 14

## 2015-06-18 DIAGNOSIS — M549 Dorsalgia, unspecified: Secondary | ICD-10-CM | POA: Diagnosis not present

## 2015-06-18 DIAGNOSIS — M7552 Bursitis of left shoulder: Secondary | ICD-10-CM | POA: Diagnosis not present

## 2015-06-18 DIAGNOSIS — E785 Hyperlipidemia, unspecified: Secondary | ICD-10-CM | POA: Insufficient documentation

## 2015-06-18 DIAGNOSIS — K589 Irritable bowel syndrome without diarrhea: Secondary | ICD-10-CM | POA: Insufficient documentation

## 2015-06-18 DIAGNOSIS — Z800 Family history of malignant neoplasm of digestive organs: Secondary | ICD-10-CM | POA: Diagnosis not present

## 2015-06-18 DIAGNOSIS — K219 Gastro-esophageal reflux disease without esophagitis: Secondary | ICD-10-CM | POA: Insufficient documentation

## 2015-06-18 DIAGNOSIS — M542 Cervicalgia: Secondary | ICD-10-CM | POA: Diagnosis not present

## 2015-06-18 DIAGNOSIS — M791 Myalgia: Secondary | ICD-10-CM | POA: Diagnosis present

## 2015-06-18 DIAGNOSIS — G894 Chronic pain syndrome: Secondary | ICD-10-CM | POA: Diagnosis not present

## 2015-06-18 DIAGNOSIS — Z8249 Family history of ischemic heart disease and other diseases of the circulatory system: Secondary | ICD-10-CM | POA: Diagnosis not present

## 2015-06-18 DIAGNOSIS — M810 Age-related osteoporosis without current pathological fracture: Secondary | ICD-10-CM | POA: Insufficient documentation

## 2015-06-18 DIAGNOSIS — Z833 Family history of diabetes mellitus: Secondary | ICD-10-CM | POA: Insufficient documentation

## 2015-06-18 MED ORDER — ACETAMINOPHEN-CODEINE #4 300-60 MG PO TABS: 1.0000 | ORAL_TABLET | Freq: Three times a day (TID) | ORAL | Status: DC | PRN

## 2015-06-18 NOTE — Progress Notes (Signed)
Chief complaint left lower extremity pain  75 year old female with history of fibromyalgia, cervical spondylosis as well as lumbar spondylosis who states she's had pain behind the left thigh. She can't really tell when the onset was. No discrete injury. She states in the past she's had a blood clot in her legBut could not tell me when or where. She does not have any warmth or any swelling in the leg. She's had no recent falls. She does not have any numbness or tingling in the foot. No weakness in the leg. No new bowel or bladder issues.  Review of systems: Positive for left shoulder pain, recurrent after left shoulder injection, has milder right shoulder pain.  Examination Gen. No acute distress Mood and affect are appropriate Positive impingement side bilateral shoulders. Left at 80 Patient with No evidence deformity and shoulders. No subluxation. No muscle atrophy. Lumbar spine has some tenderness to palpation There is negative straight leg raise in the left lower extremity Motor strength is 5/5 bilateral hip flexor and extensor ankle dorsal flexor Sensation intact to light touch pinprick and vibratory sense in both lower limbs There is no swelling or tenderness palpation bilateral calves or thigh area. No evidence of knee effusion Hip range of motion is normal no pain with hip internal/external rotation   Impression 1.  Subacromial bursitis left greater than right shoulder has had good results with injection but this was 3 months ago. We'll repeat  Shoulder injection Subacromial    Indication:Left Shoulder pain not relieved by medication management and other conservative care.  Informed consent was obtained after describing risks and benefits of the procedure with the patient, this includes bleeding, bruising, infection and medication side effects. The patient wishes to proceed and has given written consent. Patient was placed in a seated position. The Left shoulder was marked and  prepped with betadine in the subacromial area. A 25-gauge 1-1/2 inch needle was inserted into the subacromial area. After negative draw back for blood, a solution containing 1 mL of 6 mg per ML betamethasone and 4 mL of 1% lidocaine was injected. A band aid was applied. The patient tolerated the procedure well. Post procedure instructions were given.  2. Low back pain with left lower extremity pain no objective signs of radiculopathy. We'll give her hamstring stretching exercises as her pain is mainly posterior thigh. There  Also given were stabilization exercises.  Follow-up in 3 months, If patient has progressive symptoms in the left lower extremity may need to have further imaging studies such as lumbar MRI without contrast

## 2015-06-18 NOTE — Patient Instructions (Signed)
If you have increasing left lower extremity pain we may need to order MRI please call our office.

## 2015-06-24 ENCOUNTER — Other Ambulatory Visit: Payer: Self-pay

## 2015-06-24 MED ORDER — ALPRAZOLAM 0.5 MG PO TABS: 0.5000 mg | ORAL_TABLET | Freq: Two times a day (BID) | ORAL | Status: DC

## 2015-06-24 NOTE — Telephone Encounter (Signed)
Please advise, thanks.

## 2015-06-24 NOTE — Telephone Encounter (Signed)
[facility]. Fax: [phone]  rq rf for alprazolam

## 2015-06-24 NOTE — Telephone Encounter (Signed)
Signed and faxed to [facility] per patient request

## 2015-06-24 NOTE — Telephone Encounter (Signed)
printed

## 2015-07-03 ENCOUNTER — Other Ambulatory Visit: Payer: Self-pay | Admitting: Internal Medicine

## 2015-07-29 ENCOUNTER — Other Ambulatory Visit: Payer: Self-pay | Admitting: Family Medicine

## 2015-07-29 NOTE — Telephone Encounter (Signed)
Yes refill one more time then would need office visit.

## 2015-07-29 NOTE — Telephone Encounter (Signed)
Pt has not been seen since 07/2014.  OK to refill?

## 2015-07-29 NOTE — Telephone Encounter (Signed)
REFILL DONE

## 2015-08-03 ENCOUNTER — Other Ambulatory Visit: Payer: Self-pay | Admitting: Internal Medicine

## 2015-08-18 ENCOUNTER — Other Ambulatory Visit: Payer: Self-pay | Admitting: Internal Medicine

## 2015-08-20 DIAGNOSIS — G43009 Migraine without aura, not intractable, without status migrainosus: Secondary | ICD-10-CM | POA: Diagnosis not present

## 2015-08-20 DIAGNOSIS — G44219 Episodic tension-type headache, not intractable: Secondary | ICD-10-CM | POA: Diagnosis not present

## 2015-09-04 ENCOUNTER — Other Ambulatory Visit: Payer: Self-pay | Admitting: Internal Medicine

## 2015-09-12 ENCOUNTER — Encounter: Payer: Self-pay | Admitting: Physical Medicine & Rehabilitation

## 2015-09-16 ENCOUNTER — Encounter: Payer: Medicare Other | Attending: Physical Medicine & Rehabilitation

## 2015-09-16 ENCOUNTER — Encounter: Payer: Self-pay | Admitting: Physical Medicine & Rehabilitation

## 2015-09-16 ENCOUNTER — Ambulatory Visit (HOSPITAL_BASED_OUTPATIENT_CLINIC_OR_DEPARTMENT_OTHER): Payer: Medicare Other | Admitting: Physical Medicine & Rehabilitation

## 2015-09-16 VITALS — BP 142/82 | HR 108 | Resp 14

## 2015-09-16 DIAGNOSIS — Z5181 Encounter for therapeutic drug level monitoring: Secondary | ICD-10-CM | POA: Diagnosis not present

## 2015-09-16 DIAGNOSIS — M791 Myalgia: Secondary | ICD-10-CM | POA: Diagnosis present

## 2015-09-16 DIAGNOSIS — Z833 Family history of diabetes mellitus: Secondary | ICD-10-CM | POA: Insufficient documentation

## 2015-09-16 DIAGNOSIS — K589 Irritable bowel syndrome without diarrhea: Secondary | ICD-10-CM | POA: Insufficient documentation

## 2015-09-16 DIAGNOSIS — G894 Chronic pain syndrome: Secondary | ICD-10-CM

## 2015-09-16 DIAGNOSIS — M7552 Bursitis of left shoulder: Secondary | ICD-10-CM | POA: Diagnosis not present

## 2015-09-16 DIAGNOSIS — M810 Age-related osteoporosis without current pathological fracture: Secondary | ICD-10-CM | POA: Insufficient documentation

## 2015-09-16 DIAGNOSIS — M549 Dorsalgia, unspecified: Secondary | ICD-10-CM | POA: Insufficient documentation

## 2015-09-16 DIAGNOSIS — E785 Hyperlipidemia, unspecified: Secondary | ICD-10-CM | POA: Diagnosis not present

## 2015-09-16 DIAGNOSIS — Z8249 Family history of ischemic heart disease and other diseases of the circulatory system: Secondary | ICD-10-CM | POA: Diagnosis not present

## 2015-09-16 DIAGNOSIS — Z800 Family history of malignant neoplasm of digestive organs: Secondary | ICD-10-CM | POA: Insufficient documentation

## 2015-09-16 DIAGNOSIS — K219 Gastro-esophageal reflux disease without esophagitis: Secondary | ICD-10-CM | POA: Insufficient documentation

## 2015-09-16 DIAGNOSIS — Z79899 Other long term (current) drug therapy: Secondary | ICD-10-CM | POA: Diagnosis not present

## 2015-09-16 DIAGNOSIS — M7550 Bursitis of unspecified shoulder: Secondary | ICD-10-CM | POA: Insufficient documentation

## 2015-09-16 DIAGNOSIS — M542 Cervicalgia: Secondary | ICD-10-CM | POA: Diagnosis not present

## 2015-09-16 MED ORDER — ACETAMINOPHEN-CODEINE #4 300-60 MG PO TABS: 1.0000 | ORAL_TABLET | Freq: Three times a day (TID) | ORAL | Status: DC | PRN

## 2015-09-16 NOTE — Patient Instructions (Signed)
you may benefit from a trial of Lyrica Discuss with my nurse practitioner next visit if you like to try this.  Left subacromial bursa injection performed today with Celestone and lidocaine can be repeated every 3 months as needed.  I think the majority of your pains are related to your fibromyalgia.

## 2015-09-16 NOTE — Progress Notes (Signed)
Indication:Left Shoulder pain not relieved by medication management and other conservative care.  Informed consent was obtained after describing risks and benefits of the procedure with the patient, this includes bleeding, bruising, infection and medication side effects. The patient wishes to proceed and has given written consent. Patient was placed in a seated position. The Left shoulder was marked and prepped with betadine in the subacromial area. A 25-gauge 1-1/2 inch needle was inserted into the subacromial area. After negative draw back for blood, a solution containing 1 mL of 6 mg per ML betamethasone and 4 mL of 1% lidocaine was injected. A band aid was applied. The patient tolerated the procedure well. Post procedure instructions were given.

## 2015-09-16 NOTE — Progress Notes (Signed)
Subjective:    Patient ID: Laura Barnes, female    DOB: 1941/04/11, 75 y.o.   MRN: [id number] 1.5 - 2 yr hx of pain Involving the head the neck the back the hips and the knees and the shoulders. No history of trauma. Low back pain started about 3-4 years ago was seen by orthopedics Dr. Percell Miller and then was referred to Dr. Lorin Mercy. Dr. Lorin Mercy then referred the patient to Dr. Ernestina Patches who performed some type of lumbar injections.  Patient does not recall whether was a single injection or multiple injections at each visit. HPI  Hurts all over, neck, shoulders, back, hips Left shoulder injection did help, This was performed 3 months ago  Past history significant for fibromyalgia syndrome.She also has a history of cervical spinal stenosis which is fairly severe as well as lumbar spondylosis but no evidence of significant lumbar spinal stenosis. Pain Inventory Average Pain 10 Pain Right Now 8 My pain is aching  In the last 24 hours, has pain interfered with the following? General activity 10 Relation with others 9 Enjoyment of life 10 What TIME of day is your pain at its worst? all Sleep (in general) Fair  Pain is worse with: walking, sitting, inactivity, standing and some activites Pain improves with: no selection Relief from Meds: 0  Mobility walk without assistance how many minutes can you walk? 15 ability to climb steps?  yes do you drive?  yes  Function retired  Neuro/Psych tingling  Prior Studies Any changes since last visit?  no  Physicians involved in your care Any changes since last visit?  no   Family History  Problem Relation Age of Onset  . Diabetes Mother   . Diabetes Sister   . Breast cancer Maternal Grandmother   . Heart disease Maternal Uncle     x 2  . Diabetes Other     multi relatives on father side  . Heart disease Maternal Aunt   . Kidney disease Paternal Uncle     questionable  . Colon cancer Neg Hx   . Colon polyps Neg Hx   . Rectal  cancer Neg Hx   . Stomach cancer Neg Hx   . Heart disease Father    Social History   Social History  . Marital Status: Married    Spouse Name: N/A  . Number of Children: 2  . Years of Education: N/A   Occupational History  . retired Press photographer    Social History Main Topics  . Smoking status: Never Smoker   . Smokeless tobacco: Never Used  . Alcohol Use: No  . Drug Use: No  . Sexual Activity: Not Asked   Other Topics Concern  . None   Social History Narrative   Has 2 biological children and 1 step child   Past Surgical History  Procedure Laterality Date  . Appendectomy    . Cholecystectomy    . 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 Oophorectomy    . Shoulder surgery    . S/p neck lump removal    . Breast enhancement surgery    . Vaginal hysterectomy    . Tonsillectomy    . Colonoscopy    . Sinus endo w/fusion Right 06/30/2013    Procedure: RIGHT ENDOSCOPIC SPHENOIDECTOMY WITH FUSION SCAN;  Surgeon: [name], MD;  Location: [facility];  Service: ENT;  Laterality: Right;   Past Medical History  Diagnosis Date  . HYPERLIPIDEMIA   .  ANXIETY   . DEPRESSION   . MIGRAINE, COMMON   . MIGRAINE HEADACHE   . VENOUS INSUFFICIENCY, CHRONIC   . SINUSITIS- ACUTE-NOS   . ALLERGIC RHINITIS   . GERD   . IBS   . VAGINITIS   . SKIN LESION   . Enthesopathy of hip region   . FOOT PAIN, BILATERAL   . PLANTAR FASCIITIS   . OSTEOPOROSIS   . OSTEOPENIA   . RASH-NONVESICULAR   . NECK MASS   . Hiatal hernia   . Hypothyroidism   . Bronchitis   . Anemia   . Complication of anesthesia     says one time waking up she couldn't breathe, felt like her throat closing up  . Family history of anesthesia complication     sister with n/v   BP 142/82 mmHg  Pulse 108  Resp 14  SpO2 96%  Opioid Risk Score:   Fall Risk Score:  `1  Depression screen PHQ 2/9  Depression screen [facility] 2/9 01/29/2015 12/17/2014 05/01/2014  Decreased Interest 0 0 0    Down, Depressed, Hopeless 0 0 0  PHQ - 2 Score 0 0 0  Altered sleeping - 0 -  Tired, decreased energy - 0 -  Change in appetite - 0 -  Feeling bad or failure about yourself  - 0 -  Trouble concentrating - 0 -  Moving slowly or fidgety/restless - 0 -  Suicidal thoughts - 0 -  PHQ-9 Score - 0 -     Review of Systems  Constitutional: Positive for diaphoresis and unexpected weight change.  Respiratory: Positive for cough.   Gastrointestinal: Positive for nausea and constipation.       Objective:   Physical Exam  Constitutional: She is oriented to person, place, and time. She appears well-developed and well-nourished.  HENT:  Head: Normocephalic and atraumatic.  Eyes: Conjunctivae are normal. Pupils are equal, round, and reactive to light.  Neck: Normal range of motion.  Musculoskeletal: Normal range of motion.  Neurological: She is alert and oriented to person, place, and time.  Psychiatric: She has a normal mood and affect.  Nursing note and vitals reviewed.   Left shoulder with positive impingement sign Right shoulder without evidence of impingement sign Fibromyalgia tender points +18/18      Assessment & Plan:  1. Fibromyalgia syndrome, I think this is the underlying problem of her widespread body pain. She would likely benefit from Lyrica however she states that years ago her primary physician didn't feel she should try it. She does not remember why. She had been seen by neurology in the past and placed on gabapentin and had some type of reaction to this but does not recall what it was. She also states she could not tolerate Cymbalta but does not report a specific symptom. These are not listed as allergies although she does have multiple medication allergies listed.  Recommend exercise at least 30 minute the other day She is planning on resuming some of the core strengthening exercises she did for belly dancing  2. Left subacromial bursitis would recommend repeat  shoulder injection  3. Cervical spinal stenosis this is likely causing some of the upper back and neck pain. She does not have any significant radicular symptoms however

## 2015-09-23 LAB — 6-ACETYLMORPHINE,TOXASSURE ADD
6-ACETYLMORPHINE: NEGATIVE
6-acetylmorphine: NOT DETECTED ng/mg creat

## 2015-09-23 LAB — TOXASSURE SELECT,+ANTIDEPR,UR: PDF: 0

## 2015-09-26 NOTE — Progress Notes (Signed)
Urine drug screen for this encounter is consistent for prescribed medication 

## 2015-10-03 ENCOUNTER — Other Ambulatory Visit: Payer: Self-pay | Admitting: Internal Medicine

## 2015-10-07 ENCOUNTER — Encounter: Payer: Self-pay | Admitting: Internal Medicine

## 2015-10-07 ENCOUNTER — Ambulatory Visit: Payer: Medicare Other | Admitting: Internal Medicine

## 2015-10-07 VITALS — BP 118/72 | HR 90 | Ht 60.0 in | Wt 151.6 lb

## 2015-10-07 DIAGNOSIS — J310 Chronic rhinitis: Secondary | ICD-10-CM

## 2015-10-07 MED ORDER — BENZONATATE 200 MG PO CAPS: ORAL_CAPSULE | ORAL | Status: DC

## 2015-10-07 MED ORDER — UMECLIDINIUM BROMIDE 62.5 MCG/INH IN AEPB: 1.0000 | INHALATION_SPRAY | Freq: Every day | RESPIRATORY_TRACT | Status: DC

## 2015-10-07 NOTE — Progress Notes (Signed)
Patient ID: Laura Barnes, female   DOB: 29-Sep-1940, 75 y.o.   MRN: [id number]   Patient seen in the office today and instructed on use of Incruse.  Patient expressed understanding and demonstrated technique.  Joellen Jersey [facility] 10/07/2015

## 2015-10-07 NOTE — Patient Instructions (Signed)
Script sent for tessalon perles  Sample Incruse Ellipta inhaler    Inhale 1 puff, once daily.  Use this up, and see if you notice any improvement in cough, wheeze, shortness of breath

## 2015-10-07 NOTE — Progress Notes (Signed)
01/07/15- 62 yoF never smoker referred courtesy of Dr Jenny Reichmann for allergy consultation . Husband is here  Specifically she is concerned about nonproductive cough present 5 years, worse lying down. No specific onset. She denies seasonal or exposure exacerbation and does not wheeze. Tried Delsym cough syrup. Tried albuterol inhaler "never learned to use it". Zyrtec helps nasal drainage but does not help cough. Not much postnasal drip. Cough is worse lying down at night. GERD fairly well controlled on twice daily Nexium 40 mg with occasional breakthrough. Occasional choking while eating. Pills may hang up mid esophagus. She states she stays on the sofa because of fibromyalgia. She and husband sleep with head of bed elevated. Older house has partial basement with sump pump in the basement and no recognized mold. 2 dogs. No smokers. Allergy vaccine as a [place] woman with Dr. Rayetta Pigg  04/09/15- 75 year old female never smoker followed for cough, rhinitis with question of allergy basis, complicated by GERD, fibromyalgia           husband here   FOLLOWS FOR: Pt states she has noticed increase in cough since last visit; starting to happen when she wakes up-productive at times-slight color; PND. She has been pleased with effectiveness of Tessalon pearls which stopped her nighttime cough. Subsequently aware of some increased cough in the daytime and she gets up and moves around. Denies choking when she eats. Scant sputum is usually white. Feels as if coughed triggers in her throat. We discussed upper airway nerve irritability etiology for cough. She was on gabapentin in the past for something she can't remember and she cannot remember why she stopped it, so she is reluctant to try it again. CXR clear 09/19/14 Allergy profiles 01/07/15-negative for specific allergen IgE elevations, total IgE 15 CRP-normal Sed rate-43-nonspecific  10/07/2015-75 year old female never smoker followed for cough, rhinitis/non-allergic,  Complicated by GERD, fibromyalgia FOLLOWS FOR: Pt states she continues to have dry cough; would like to have 30 days supply of Tessalon Rx.  Intolerant of gabapentin-  ROS-see HPI   Negative unless "+" Constitutional:    weight loss, night sweats, fevers, chills, fatigue, lassitude. HEENT:   + headaches, difficulty swallowing, tooth/dental problems, sore throat,       +sneezing, itching, ear ache, nasal congestion, post nasal drip, snoring CV:    chest pain, orthopnea, PND, swelling in lower extremities, anasarca,                                                      dizziness, palpitations Resp:   shortness of breath with exertion or at rest.                productive cough,   +non-productive cough, coughing up of blood.              change in color of mucus.  wheezing.   Skin:    rash or lesions. GI:  +heartburn, indigestion, abdominal pain, nausea, vomiting,  GU: . MS:   +joint pain, stiffness, Neuro-     nothing unusual Psych:  change in mood or affect.  depression or anxiety.   memory loss.  OBJ- Physical Exam General- Alert, Oriented, Affect-appropriate, Distress- none acute Skin- rash-none, lesions- none, excoriation- none Lymphadenopathy- none Head- atraumatic            Eyes- Gross vision intact, PERRLA, conjunctivae  and secretions clear            Ears- Hearing, canals-normal            Nose- Clear, no-Septal dev, mucus, polyps, erosion, perforation             Throat- Mallampati II , mucosa clear , drainage- none, tonsils- atrophic Neck- flexible , trachea midline, no stridor , thyroid nl, carotid no bruit Chest - symmetrical excursion , unlabored           Heart/CV- RRR , no murmur , no gallop  , no rub, nl s1 s2                           - JVD- none , edema- none, stasis changes- none, varices- none           Lung- clear to P&A, wheeze- none, cough + minor/dry , dullness-none, rub- none           Chest wall-  Abd-  Br/ Gen/ Rectal- Not done, not indicated Extrem-  cyanosis- none, clubbing, none, atrophy- none, strength- nl Neuro- grossly intact to observation

## 2015-10-09 DIAGNOSIS — H524 Presbyopia: Secondary | ICD-10-CM | POA: Diagnosis not present

## 2015-10-09 DIAGNOSIS — H2513 Age-related nuclear cataract, bilateral: Secondary | ICD-10-CM | POA: Diagnosis not present

## 2015-10-09 DIAGNOSIS — H25013 Cortical age-related cataract, bilateral: Secondary | ICD-10-CM | POA: Diagnosis not present

## 2015-10-10 ENCOUNTER — Telehealth: Payer: Self-pay | Admitting: Internal Medicine

## 2015-10-10 NOTE — Telephone Encounter (Signed)
Spoke with patient-she is aware of recs from CY and nothing more needed at this time.

## 2015-10-10 NOTE — Telephone Encounter (Signed)
Called spoke with the pharmacy tech at [facility] to verify price of rx. He states that the patient's benzonatate will cost her $70.00.   Called spoke with pt. She states that the benzonatate that CY gave her at her ov on 10/07/15 is to expensive. She states that her insurance no longer will cover there Rx. She request message be placed to [facility] for further recs. I explained to her that I would make CY aware and would return her call with his recs. She voiced understanding and had no further questions.  CY please advise   Allergies  Allergen Reactions  . Prednisone Anaphylaxis    Patient unable to remember why she needed to steroid shot but just knows that when she got back to work she started having a lot of trouble breathing.  (jkl 05/04/14)  . Aspirin Other (See Comments)    Stomach ache and bleed  . Erythromycin Diarrhea  . Latex Other (See Comments)    blisters  . Levofloxacin Other (See Comments)    Headaches, GI upset  . Nsaids Other (See Comments)    stomach bleeding per pt  . Statins Nausea Only and Other (See Comments)    Headache, upset stomach, joint hurt, heartburn  . Sumatriptan Hives and Palpitations  . Adhesive [Tape] Other (See Comments)    blisters  . Amoxicillin-Pot Clavulanate   . Doxycycline   . Ivp Dye [Iodinated Diagnostic Agents]     If made with blue shellfish then CAN NOT have this type of dye.   . Methylphenidate Hcl   . Mirtazapine   . Shellfish Allergy     Blue shellfish  . Hydrocodone Itching  . Hydrocodone-Acetaminophen Itching  . Rofecoxib Nausea Only  . Sertraline Hcl Nausea Only  . Sulfonamide Derivatives Itching     Current outpatient prescriptions:  .  acetaminophen-codeine (TYLENOL #4) 300-60 MG tablet, Take 1 tablet by mouth every 8 (eight) hours as needed for moderate pain., Disp: 90 tablet, Rfl: 2 .  ALPRAZolam (XANAX) 0.5 MG tablet, Take 1 tablet (0.5 mg total) by mouth 2 (two) times daily., Disp: 60 tablet, Rfl: 2 .  Amino Acids (AMINO  ACID PO), Take 1 capsule by mouth 2 (two) times daily., Disp: , Rfl:  .  B Complex-Biotin-FA (B COMPLETE) TABS, Take 1 tablet by mouth daily. B Complete 100mg , Disp: , Rfl:  .  baclofen (LIORESAL) 10 MG tablet, Take 1 tablet (10 mg total) by mouth 3 (three) times daily. NEEDS APPOINTMENT FOR FUTURE REFILLS., Disp: 270 tablet, Rfl: 0 .  benzonatate (TESSALON) 200 MG capsule, TAKE 1 CAPSULE(200 MG) BY MOUTH THREE TIMES DAILY AS NEEDED FOR COUGH, Disp: 90 capsule, Rfl: 5 .  buPROPion (WELLBUTRIN XL) 300 MG 24 hr tablet, TAKE 1 TABLET (300 MG TOTAL) BY MOUTH DAILY., Disp: 90 tablet, Rfl: 3 .  calcium carbonate (OS-CAL) 600 MG TABS tablet, Take 600 mg by mouth 2 (two) times daily with a meal. , Disp: , Rfl:  .  cetirizine (ZYRTEC) 10 MG tablet, TAKE 1 TABLET (10 MG TOTAL) BY MOUTH DAILY., Disp: 30 tablet, Rfl: 11 .  cholecalciferol (VITAMIN D) 1000 UNITS tablet, Take 1,000 Units by mouth daily., Disp: , Rfl:  .  clidinium-chlordiazePOXIDE (LIBRAX) 5-2.5 MG per capsule, Take 1 capsule by mouth daily as needed (for stomach)., Disp: 60 capsule, Rfl: 4 .  Coenzyme Q10 (CO Q-10) 200 MG CAPS, Take 1 capsule by mouth 2 (two) times daily., Disp: , Rfl:  .  esomeprazole (NEXIUM) 40 MG capsule,  Take 1 capsule (40 mg total) by mouth 2 (two) times daily before a meal., Disp: 60 capsule, Rfl: 11 .  estradiol (ESTRACE) 1 MG tablet, Take 1 tablet (1 mg total) by mouth daily., Disp: 90 tablet, Rfl: 3 .  FLUoxetine (PROZAC) 20 MG capsule, TAKE 1 CAPSULE BY MOUTH EVERY DAY, Disp: 90 capsule, Rfl: 0 .  fluticasone (FLONASE) 50 MCG/ACT nasal spray, Place 2 sprays into both nostrils daily., Disp: 16 g, Rfl: 2 .  KLOR-CON 10 10 MEQ tablet, TAKE 1 TABLET (10 MEQ TOTAL) BY MOUTH DAILY., Disp: 90 tablet, Rfl: 2 .  levothyroxine (SYNTHROID, LEVOTHROID) 25 MCG tablet, TAKE 1 TABLET (25 MCG TOTAL) BY MOUTH DAILY BEFORE BREAKFAST., Disp: 90 tablet, Rfl: 1 .  Meperidine HCl (DEMEROL PO), Take by mouth as needed. Takes for headaches  unsure dose, Disp: , Rfl:  .  Multiple Vitamin (MULTIVITAMIN WITH MINERALS) TABS tablet, Take 1 tablet by mouth daily., Disp: , Rfl:  .  Omega-3 Fatty Acids (SALMON OIL-1000 PO), Take 1,000 mg by mouth daily., Disp: , Rfl:  .  ondansetron (ZOFRAN) 4 MG tablet, Take 1 tablet (4 mg total) by mouth every 8 (eight) hours as needed for nausea or vomiting., Disp: 40 tablet, Rfl: 1 .  topiramate (TOPAMAX) 50 MG tablet, Take 50-250 mg by mouth 2 (two) times daily. Take 50mg  (1 tablet) daily in the morning.  Take 250mg  (5 tablets) daily at bedtime, Disp: , Rfl:  .  triamcinolone cream (KENALOG) 0.5 %, Apply topically 2 (two) times daily. As needed for rash, Disp: 30 g, Rfl: 0 .  triamterene-hydrochlorothiazide (MAXZIDE-25) 37.5-25 MG tablet, TAKE 1 TABLET BY MOUTH EVERY DAY, Disp: 30 tablet, Rfl: 0 .  TURMERIC PO, Take by mouth., Disp: , Rfl:  .  umeclidinium bromide (INCRUSE ELLIPTA) 62.5 MCG/INH AEPB, Inhale 1 puff into the lungs daily., Disp: 1 each, Rfl: 0

## 2015-10-10 NOTE — Telephone Encounter (Signed)
Best bet would be to use otc Mucinex-DM or Delsym cough syrup, supplemented with throat lozenges and sips of liquids as needed.

## 2015-10-11 ENCOUNTER — Other Ambulatory Visit: Payer: Self-pay | Admitting: Internal Medicine

## 2015-10-11 NOTE — Telephone Encounter (Signed)
RX faxed to [facility] 

## 2015-10-23 ENCOUNTER — Other Ambulatory Visit: Payer: Self-pay | Admitting: Family Medicine

## 2015-10-23 NOTE — Telephone Encounter (Signed)
Spoke to pt, advised her she will need an OV w/ Dr. Tamala Julian to receive more refills. Pt scheduled appt for 6.7.17 @ 230pm. Refill sent in for #30.

## 2015-11-05 ENCOUNTER — Ambulatory Visit (INDEPENDENT_AMBULATORY_CARE_PROVIDER_SITE_OTHER): Payer: Medicare Other | Admitting: Internal Medicine

## 2015-11-05 ENCOUNTER — Other Ambulatory Visit (INDEPENDENT_AMBULATORY_CARE_PROVIDER_SITE_OTHER): Payer: Medicare Other

## 2015-11-05 ENCOUNTER — Encounter: Payer: Self-pay | Admitting: Internal Medicine

## 2015-11-05 VITALS — BP 152/108 | HR 94 | Temp 98.3°F | Resp 16 | Wt 150.0 lb

## 2015-11-05 DIAGNOSIS — M797 Fibromyalgia: Secondary | ICD-10-CM | POA: Diagnosis not present

## 2015-11-05 DIAGNOSIS — K219 Gastro-esophageal reflux disease without esophagitis: Secondary | ICD-10-CM

## 2015-11-05 DIAGNOSIS — R600 Localized edema: Secondary | ICD-10-CM | POA: Diagnosis not present

## 2015-11-05 DIAGNOSIS — F411 Generalized anxiety disorder: Secondary | ICD-10-CM

## 2015-11-05 DIAGNOSIS — E039 Hypothyroidism, unspecified: Secondary | ICD-10-CM

## 2015-11-05 DIAGNOSIS — G894 Chronic pain syndrome: Secondary | ICD-10-CM

## 2015-11-05 DIAGNOSIS — M542 Cervicalgia: Secondary | ICD-10-CM | POA: Diagnosis not present

## 2015-11-05 DIAGNOSIS — G43809 Other migraine, not intractable, without status migrainosus: Secondary | ICD-10-CM

## 2015-11-05 DIAGNOSIS — Z780 Asymptomatic menopausal state: Secondary | ICD-10-CM

## 2015-11-05 DIAGNOSIS — F32A Depression, unspecified: Secondary | ICD-10-CM

## 2015-11-05 DIAGNOSIS — M810 Age-related osteoporosis without current pathological fracture: Secondary | ICD-10-CM

## 2015-11-05 DIAGNOSIS — Z79890 Hormone replacement therapy (postmenopausal): Secondary | ICD-10-CM | POA: Insufficient documentation

## 2015-11-05 DIAGNOSIS — F329 Major depressive disorder, single episode, unspecified: Secondary | ICD-10-CM

## 2015-11-05 LAB — COMPREHENSIVE METABOLIC PANEL
ALT: 22 U/L (ref 0–35)
AST: 25 U/L (ref 0–37)
Albumin: 4.5 g/dL (ref 3.5–5.2)
Alkaline Phosphatase: 51 U/L (ref 39–117)
BUN: 13 mg/dL (ref 6–23)
CO2: 31 mEq/L (ref 19–32)
Calcium: 10.6 mg/dL — ABNORMAL HIGH (ref 8.4–10.5)
Chloride: 97 mEq/L (ref 96–112)
Creatinine, Ser: 1 mg/dL (ref 0.40–1.20)
GFR: 57.47 mL/min — ABNORMAL LOW (ref >60.00)
Glucose, Bld: 99 mg/dL (ref 70–99)
Potassium: 3.4 mEq/L — ABNORMAL LOW (ref 3.5–5.1)
Sodium: 137 mEq/L (ref 135–145)
Total Bilirubin: 0.4 mg/dL (ref 0.2–1.2)
Total Protein: 8 g/dL (ref 6.0–8.3)

## 2015-11-05 LAB — CBC WITH DIFFERENTIAL/PLATELET
Basophils Absolute: 0 10*3/uL (ref 0.0–0.1)
Basophils Relative: 0.3 % (ref 0.0–3.0)
Eosinophils Absolute: 0.3 10*3/uL (ref 0.0–0.7)
Eosinophils Relative: 2.6 % (ref 0.0–5.0)
HCT: 41.9 % (ref 36.0–46.0)
Hemoglobin: 14.3 g/dL (ref 12.0–15.0)
Lymphocytes Relative: 24 % (ref 12.0–46.0)
Lymphs Abs: 3.1 10*3/uL (ref 0.7–4.0)
MCHC: 34.1 g/dL (ref 30.0–36.0)
MCV: 93.1 fl (ref 78.0–100.0)
Monocytes Absolute: 1.5 10*3/uL — ABNORMAL HIGH (ref 0.1–1.0)
Monocytes Relative: 11.5 % (ref 3.0–12.0)
Neutro Abs: 7.9 10*3/uL — ABNORMAL HIGH (ref 1.4–7.7)
Neutrophils Relative %: 61.6 % (ref 43.0–77.0)
Platelets: 280 10*3/uL (ref 150.0–400.0)
RBC: 4.5 Mil/uL (ref 3.87–5.11)
RDW: 14.2 % (ref 11.5–15.5)
WBC: 12.8 10*3/uL — ABNORMAL HIGH (ref 4.0–10.5)

## 2015-11-05 LAB — TSH: TSH: 3.82 u[IU]/mL (ref 0.35–4.50)

## 2015-11-05 NOTE — Assessment & Plan Note (Signed)
Improved pain with CBD oil No longer taking tylenol #4 (prescribed by pain management)

## 2015-11-05 NOTE — Assessment & Plan Note (Signed)
No history of htn and BP has always been well controlled bp elevated here today - will start checking it at home Continue maxizide

## 2015-11-05 NOTE — Assessment & Plan Note (Signed)
Has tried to come off in the past and was very symptomatic Does not want to come off medication Discussed increased risk of blood clots, MI and CVA Advised we need to consider coming off medication in future

## 2015-11-05 NOTE — Assessment & Plan Note (Signed)
Improved with CBD oil

## 2015-11-05 NOTE — Patient Instructions (Addendum)
  Test(s) ordered today. Your results will be released to [facility] (or called to you) after review, usually within 72hours after test completion. If any changes need to be made, you will be notified at that same time.   Medications reviewed and updated.  No changes recommended at this time.   A ct scan of your neck was ordered.  A dexa scan was ordered.   Please followup in 6 months

## 2015-11-05 NOTE — Assessment & Plan Note (Signed)
Controlled, stable Continue current medication  

## 2015-11-05 NOTE — Assessment & Plan Note (Signed)
Pain on right side of neck with coughing and swallowing  - tender with palpation Discussed ENT referral and imaging Will check a ct of neck to rule out mass - discussed she may need to see ENT for scope

## 2015-11-05 NOTE — Progress Notes (Signed)
Subjective:    Patient ID: Laura Barnes, female    DOB: 1940-10-30, 75 y.o.   MRN: [id number]  HPI She is here for follow up.  Chronic pain, fibromyalgia:  She is following with pain management and was taking tylenol #4 and it did not help.  She stopped taking it.  She has been taking CBD oil and has no pain.  She has no side effects.    Migraine headaches:  She is following with neurology.  shewas taking demerol because that was the only thing that worked. Her fibromyalgia would trigger her migraines, as well as other things, she is not using CBD oil and this has helped both her fibro and migraines.   Hypothyroidism:  She is taking her medication daily.  She denies any recent changes in energy or weight that are unexplained.   Anxiety, depression: She is taking her medication daily as prescribed. She denies any side effects from the medication. She feels her anxiety and depression are well controlled and she is happy with her current dose of medication.   Leg edema, no htn: she takes a fluid pill daily.  Her BP has always been controlled. She does not monitor her BP at home.    GERD:  She is taking her medication daily as prescribed.  She denies any GERD symptoms and feels her GERD is well controlled.   Cough:  She has a place in the back of her throat that can be sore when she coughs.  It radiates up to her ear drum.  She has has some pain when she swallows.  She can not tell if it is in her esophagus or in her neck.    Anterior neck pain on right:  Occurs with swallowing and coughing.  It has been there for a while, but is worse now.  It is tender to touch.  She is unable to tell if it is on the inside of her throat or in the soft tissue of her neck.  She does have significant cervial disease.   Medications and allergies reviewed with patient and updated if appropriate.  Patient Active Problem List   Diagnosis Date Noted  . Subacromial bursitis 09/16/2015  . Umbilical pain  123XX123  . Fluid retention in legs 05/07/2015  . 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 Trochanteric bursitis of both hips 03/12/2015  . Subacromial impingement of left shoulder 03/12/2015  . Breast implant removal status 05/01/2014  . Hyperlipemia 05/01/2014  . Chronic pain syndrome 05/01/2014  . Lumbar radiculopathy 03/27/2014  . Left shoulder pain 11/07/2013  . Degenerative cervical disc 10/10/2013  . Fibromyalgia 09/06/2013  . Bilateral shoulder pain 09/06/2013  . Urinary frequency 08/12/2013  . Low back pain 08/12/2013  . Cough 04/12/2013  . Sinusitis, chronic 04/12/2013  . Hot flashes 11/15/2012  . Hypothyroidism 04/07/2012  . Facial pain 10/01/2010  . Preventative health care 09/28/2010  . VENOUS INSUFFICIENCY, CHRONIC 04/02/2010  . FOOT PAIN, BILATERAL 04/02/2010  . Enthesopathy of hip region 07/19/2007  . OSTEOPOROSIS 07/19/2007  . ANXIETY 01/23/2007  . Depression 01/23/2007  . Migraine 01/23/2007  . NINAR (noninfectious nonallergic rhinitis) 01/23/2007  . GERD 01/23/2007  . IBS 01/23/2007    Current Outpatient Prescriptions on File Prior to Visit  Medication Sig Dispense Refill  . ALPRAZolam (XANAX) 0.5 MG tablet TAKE 1 TABLET BY MOUTH TWICE DAILY 60 tablet 2  . Amino Acids (AMINO ACID PO) Take 1 capsule by mouth 2 (  two) times daily.    . B Complex-Biotin-FA (B COMPLETE) TABS Take 1 tablet by mouth daily. B Complete 100mg     . baclofen (LIORESAL) 10 MG tablet Take 1 tablet (10 mg total) by mouth 3 (three) times daily. 30 tablet 0  . benzonatate (TESSALON) 200 MG capsule TAKE 1 CAPSULE(200 MG) BY MOUTH THREE TIMES DAILY AS NEEDED FOR COUGH 90 capsule 5  . buPROPion (WELLBUTRIN XL) 300 MG 24 hr tablet TAKE 1 TABLET (300 MG TOTAL) BY MOUTH DAILY. 90 tablet 3  . calcium carbonate (OS-CAL) 600 MG TABS tablet Take 600 mg by mouth 2 (two) times daily with a meal.     . cetirizine (ZYRTEC) 10 MG tablet TAKE 1 TABLET (10 MG TOTAL)  BY MOUTH DAILY. 30 tablet 11  . cholecalciferol (VITAMIN D) 1000 UNITS tablet Take 1,000 Units by mouth daily.    . clidinium-chlordiazePOXIDE (LIBRAX) 5-2.5 MG per capsule Take 1 capsule by mouth daily as needed (for stomach). 60 capsule 4  . Coenzyme Q10 (CO Q-10) 200 MG CAPS Take 1 capsule by mouth 2 (two) times daily.    Marland Kitchen esomeprazole (NEXIUM) 40 MG capsule Take 1 capsule (40 mg total) by mouth 2 (two) times daily before a meal. 60 capsule 11  . estradiol (ESTRACE) 1 MG tablet Take 1 tablet (1 mg total) by mouth daily. 90 tablet 3  . FLUoxetine (PROZAC) 20 MG capsule TAKE 1 CAPSULE BY MOUTH EVERY DAY 90 capsule 0  . fluticasone (FLONASE) 50 MCG/ACT nasal spray Place 2 sprays into both nostrils daily. 16 g 2  . KLOR-CON 10 10 MEQ tablet TAKE 1 TABLET (10 MEQ TOTAL) BY MOUTH DAILY. 90 tablet 2  . levothyroxine (SYNTHROID, LEVOTHROID) 25 MCG tablet TAKE 1 TABLET (25 MCG TOTAL) BY MOUTH DAILY BEFORE BREAKFAST. 90 tablet 1  . Meperidine HCl (DEMEROL PO) Take by mouth as needed. Takes for headaches unsure dose    . Multiple Vitamin (MULTIVITAMIN WITH MINERALS) TABS tablet Take 1 tablet by mouth daily.    . Omega-3 Fatty Acids (SALMON OIL-1000 PO) Take 1,000 mg by mouth daily.    . ondansetron (ZOFRAN) 4 MG tablet Take 1 tablet (4 mg total) by mouth every 8 (eight) hours as needed for nausea or vomiting. 40 tablet 1  . topiramate (TOPAMAX) 50 MG tablet Take 50 mg by mouth 2 (two) times daily. Take 50mg  (1 tablet) daily in the morning.    . triamcinolone cream (KENALOG) 0.5 % Apply topically 2 (two) times daily. As needed for rash 30 g 0  . triamterene-hydrochlorothiazide (MAXZIDE-25) 37.5-25 MG tablet TAKE 1 TABLET BY MOUTH EVERY DAY 30 tablet 0  . TURMERIC PO Take by mouth.     No current facility-administered medications on file prior to visit.    Past Medical History  Diagnosis Date  . HYPERLIPIDEMIA   . ANXIETY   . DEPRESSION   . MIGRAINE, COMMON   . MIGRAINE HEADACHE   . VENOUS  INSUFFICIENCY, CHRONIC   . SINUSITIS- ACUTE-NOS   . ALLERGIC RHINITIS   . GERD   . IBS   . VAGINITIS   . SKIN LESION   . Enthesopathy of hip region   . FOOT PAIN, BILATERAL   . PLANTAR FASCIITIS   . OSTEOPOROSIS   . OSTEOPENIA   . RASH-NONVESICULAR   . NECK MASS   . Hiatal hernia   . Hypothyroidism   . Bronchitis   . Anemia   . Complication of anesthesia     says  one time waking up she couldn't breathe, felt like her throat closing up  . Family history of anesthesia complication     sister with n/v    Past Surgical History  Procedure Laterality Date  . Appendectomy    . Cholecystectomy    . 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 Oophorectomy    . Shoulder surgery    . S/p neck lump removal    . Breast enhancement surgery    . Vaginal hysterectomy    . Tonsillectomy    . Colonoscopy    . Sinus endo w/fusion Right 06/30/2013    Procedure: RIGHT ENDOSCOPIC SPHENOIDECTOMY WITH FUSION SCAN;  Surgeon: [name], MD;  Location: [facility];  Service: ENT;  Laterality: Right;    Social History   Social History  . Marital Status: Married    Spouse Name: N/A  . Number of Children: 2  . Years of Education: N/A   Occupational History  . retired Press photographer    Social History Main Topics  . Smoking status: Never Smoker   . Smokeless tobacco: Never Used  . Alcohol Use: No  . Drug Use: No  . Sexual Activity: Not Asked   Other Topics Concern  . None   Social History Narrative   Has 2 biological children and 1 step child    Family History  Problem Relation Age of Onset  . Diabetes Mother   . Diabetes Sister   . Breast cancer Maternal Grandmother   . Heart disease Maternal Uncle     x 2  . Diabetes Other     multi relatives on father side  . Heart disease Maternal Aunt   . Kidney disease Paternal Uncle     questionable  . Colon cancer Neg Hx   . Colon polyps Neg Hx   . Rectal cancer Neg Hx   . Stomach cancer Neg Hx   . Heart  disease Father     Review of Systems  Constitutional: Negative for fever.  HENT: Positive for postnasal drip and sore throat (with coughing at times or swallowing). Negative for trouble swallowing.   Respiratory: Positive for cough, shortness of breath (sometimes) and wheezing (rare).   Cardiovascular: Positive for leg swelling. Negative for chest pain and palpitations.  Gastrointestinal: Positive for abdominal pain (IBS).  Neurological: Positive for headaches. Negative for dizziness and light-headedness.       Objective:   Filed Vitals:   11/05/15 1339  BP: 152/108  Pulse: 94  Temp: 98.3 F (36.8 C)  Resp: 16   Filed Weights   11/05/15 1339  Weight: 150 lb (68.04 kg)   Body mass index is 29.3 kg/(m^2).   Physical Exam Constitutional: Appears well-developed and well-nourished. No distress.  Neck: Neck supple. No tracheal deviation present. No thyromegaly present.  No carotid bruit. No cervical adenopathy.   Cardiovascular: Normal rate, regular rhythm and normal heart sounds.   No murmur heard.  No edema Pulmonary/Chest: Effort normal and breath sounds normal. No respiratory distress. No wheezes.         Assessment & Plan:   See Problem List for Assessment and Plan of chronic medical problems.  F/u in 6 months

## 2015-11-05 NOTE — Assessment & Plan Note (Signed)
Stable, controlled Continue current medication

## 2015-11-05 NOTE — Assessment & Plan Note (Signed)
Taking nexium twice daily and still having symptoms once a week Concerned about long term side effects of PPI Compliant with a GERD diet Will follow up with GI

## 2015-11-05 NOTE — Assessment & Plan Note (Signed)
Check tsh  Titrate med dose if needed  

## 2015-11-05 NOTE — Progress Notes (Signed)
Pre visit review using our clinic review tool, if applicable. No additional management support is needed unless otherwise documented below in the visit note. 

## 2015-11-05 NOTE — Assessment & Plan Note (Signed)
Due for dexa - ordered Will discuss prolia depending on results Taking vitamin d and calcium

## 2015-11-06 ENCOUNTER — Encounter: Payer: Self-pay | Admitting: Family Medicine

## 2015-11-06 ENCOUNTER — Ambulatory Visit (INDEPENDENT_AMBULATORY_CARE_PROVIDER_SITE_OTHER): Payer: Medicare Other | Admitting: Family Medicine

## 2015-11-06 VITALS — BP 134/88 | HR 94 | Ht 60.0 in | Wt 149.0 lb

## 2015-11-06 DIAGNOSIS — M5030 Other cervical disc degeneration, unspecified cervical region: Secondary | ICD-10-CM | POA: Diagnosis not present

## 2015-11-06 DIAGNOSIS — G894 Chronic pain syndrome: Secondary | ICD-10-CM

## 2015-11-06 MED ORDER — BACLOFEN 10 MG PO TABS: 10.0000 mg | ORAL_TABLET | Freq: Three times a day (TID) | ORAL | Status: DC

## 2015-11-06 NOTE — Progress Notes (Signed)
Pre visit review using our clinic review tool, if applicable. No additional management support is needed unless otherwise documented below in the visit note. 

## 2015-11-06 NOTE — Patient Instructions (Addendum)
Good to see you and appreciate you coming in today  We will refill the baclofen for another year.  If you want to come off of it.  Decrease to 2 times a day for 2 weeks, then 1 time a day for 2 weeks, then every other day for 2 weeks then discontinue Continue the CBD if it is helping I think K[place]eins will hopefully be ok Stop the potassium if you want and try a banana a day or over the counter. Or Dr. Quay Burow can maybe get liquid.  See me only when you need me.

## 2015-11-06 NOTE — Assessment & Plan Note (Signed)
Continue to monitor with no radicular symptoms. We'll continue to refill baclofen at the same dose the patient will need to be seen at least on an annual basis if not sooner. No further imaging is necessary with patient doing well.

## 2015-11-06 NOTE — Assessment & Plan Note (Signed)
I do believe the patient continues to have a chronic pain syndrome as well as fibromyalgia. Patient throughout the course of the interview did have some splitting of different providers. Patient does have some underlying degenerative disc disease of the neck and it does seem the muscle relaxer has been beneficial. We will continue it on a more regular basis. Discuss the patient is going to titrate down and given a slow titration. Patient will come back and see me more on an as-needed basis as long as she continues to do well and we'll continue to get her chronic pain medications if she needs them from another provider. This will be challenging with her not following directions recently. Answered a significant number of questions and herd numerous stories. Spent  25 minutes with patient face-to-face and had greater than 50% of counseling including as described above in assessment and plan.

## 2015-11-06 NOTE — Progress Notes (Signed)
  [name] [place], [place] [address] Phone: [phone] Subjective:     CC: Bilateral shoulder pain and neck pain.   [id number] Laura Barnes is a 75 y.o. female coming in for followup of her neck pain and bilateral shoulder pain. 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 Patient is also seen by a pain management doctor. Patient has not been seen for greater than 3 months. Patient had an MRI after her last evaluation of her cervical spine. Patient's MRI 12/18/2013. 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   Patient has been seen other providers including a PMR physician and is doing Tylenol No. 3. We have prescribed her previously baclofen which she does use fairly regularly. Has been one year since we seen patient. Patient is being treated for chronic pain and fibromyalgia. Patient states She did not give a good Tylenol No. 3 was helping. Patient stopped this and started on cannibus oil and since that time feels that she is doing significantly better than she has over the course last 2 years. Having very minimal pain. Patient states that she's been able to increase her activities. Even thinking about decreasing the amount of use of the muscle relaxer.      Past medical history, social, surgical and family history all reviewed in electronic medical record.   Review of Systems: No headache, visual changes, nausea, vomiting, diarrhea, constipation, dizziness, abdominal pain, skin rash, fevers, chills, night sweats, weight loss, swollen lymph nodes, body aches, joint swelling, muscle aches, chest pain, shortness of breath, mood changes.   Objective Blood pressure 134/88, pulse  94, height 5' (1.524 m), weight 149 lb (67.586 kg), SpO2 95 %.  General: No apparent distress alert and oriented x3 mood and affect normal, dressed appropriately. Patient does have racing speech HEENT: Pupils equal, extraocular movements intact  Respiratory: Patient's speak in full sentences and does not appear short of breath  Cardiovascular: No lower extremity edema, non tender, no erythema  Skin: Warm dry intact with no signs of infection or rash on extremities or on axial skeleton.  Abdomen: Soft nontender  Neuro: Cranial nerves II through XII are intact, neurovascularly intact in all extremities with 2+ DTRs and 2+ pulses.  Lymph: No lymphadenopathy of posterior or anterior cervical chain or axillae bilaterally.  Gait normal with good balance and coordination.  MSK:  Non tender with full range of motion and good stability and symmetric strength and tone of shoulders, elbows, wrist, hip, knee and ankles bilaterally. Multiple areas of discomfort with palpation still noted Neck: Inspection unremarkable. No palpable stepoffs. Spurling's maneuver. Full neck range of motion Grip strength and sensation normal in bilateral hands Strength good C4 to T1 distribution, somewhat difficult to assess because patient is not being compliant No sensory change to C4 to T1 Negative Hoffman sign bilaterally Reflexes normal Continues to have multiple areas of tenderness to palpation consistent with fibromyalgia.      Impression and Recommendations:     This case required medical decision making of moderate complexity.

## 2015-11-08 ENCOUNTER — Telehealth: Payer: Self-pay | Admitting: Internal Medicine

## 2015-11-09 ENCOUNTER — Other Ambulatory Visit: Payer: Self-pay | Admitting: Internal Medicine

## 2015-11-09 ENCOUNTER — Encounter: Payer: Self-pay | Admitting: Internal Medicine

## 2015-11-13 ENCOUNTER — Ambulatory Visit (INDEPENDENT_AMBULATORY_CARE_PROVIDER_SITE_OTHER): Payer: Medicare Other | Admitting: Internal Medicine

## 2015-11-13 ENCOUNTER — Other Ambulatory Visit: Payer: Medicare Other

## 2015-11-13 ENCOUNTER — Inpatient Hospital Stay: Admission: RE | Admit: 2015-11-13 | Payer: Medicare Other | Source: Ambulatory Visit

## 2015-11-13 ENCOUNTER — Other Ambulatory Visit: Payer: Self-pay | Admitting: Internal Medicine

## 2015-11-13 ENCOUNTER — Other Ambulatory Visit (INDEPENDENT_AMBULATORY_CARE_PROVIDER_SITE_OTHER): Payer: Medicare Other

## 2015-11-13 ENCOUNTER — Encounter: Payer: Self-pay | Admitting: Internal Medicine

## 2015-11-13 VITALS — BP 134/86 | HR 95 | Temp 98.1°F | Resp 16 | Wt 149.0 lb

## 2015-11-13 DIAGNOSIS — R197 Diarrhea, unspecified: Secondary | ICD-10-CM | POA: Diagnosis not present

## 2015-11-13 DIAGNOSIS — R1084 Generalized abdominal pain: Secondary | ICD-10-CM | POA: Diagnosis not present

## 2015-11-13 LAB — COMPREHENSIVE METABOLIC PANEL
ALT: 21 U/L (ref 0–35)
AST: 23 U/L (ref 0–37)
Albumin: 3.9 g/dL (ref 3.5–5.2)
Alkaline Phosphatase: 44 U/L (ref 39–117)
BUN: 9 mg/dL (ref 6–23)
CO2: 31 mEq/L (ref 19–32)
Calcium: 9.6 mg/dL (ref 8.4–10.5)
Chloride: 99 mEq/L (ref 96–112)
Creatinine, Ser: 1.04 mg/dL (ref 0.40–1.20)
GFR: 54.93 mL/min — ABNORMAL LOW (ref >60.00)
Glucose, Bld: 97 mg/dL (ref 70–99)
Potassium: 3.1 mEq/L — ABNORMAL LOW (ref 3.5–5.1)
Sodium: 138 mEq/L (ref 135–145)
Total Bilirubin: 0.4 mg/dL (ref 0.2–1.2)
Total Protein: 7.3 g/dL (ref 6.0–8.3)

## 2015-11-13 LAB — CBC WITH DIFFERENTIAL/PLATELET
Basophils Absolute: 0 10*3/uL (ref 0.0–0.1)
Basophils Relative: 0.4 % (ref 0.0–3.0)
Eosinophils Absolute: 0.7 10*3/uL (ref 0.0–0.7)
Eosinophils Relative: 6.5 % — ABNORMAL HIGH (ref 0.0–5.0)
HCT: 40.2 % (ref 36.0–46.0)
Hemoglobin: 13.6 g/dL (ref 12.0–15.0)
Lymphocytes Relative: 21.6 % (ref 12.0–46.0)
Lymphs Abs: 2.5 10*3/uL (ref 0.7–4.0)
MCHC: 33.8 g/dL (ref 30.0–36.0)
MCV: 93.2 fl (ref 78.0–100.0)
Monocytes Absolute: 1.3 10*3/uL — ABNORMAL HIGH (ref 0.1–1.0)
Monocytes Relative: 11 % (ref 3.0–12.0)
Neutro Abs: 7 10*3/uL (ref 1.4–7.7)
Neutrophils Relative %: 60.5 % (ref 43.0–77.0)
Platelets: 268 10*3/uL (ref 150.0–400.0)
RBC: 4.31 Mil/uL (ref 3.87–5.11)
RDW: 13.9 % (ref 11.5–15.5)
WBC: 11.5 10*3/uL — ABNORMAL HIGH (ref 4.0–10.5)

## 2015-11-13 MED ORDER — LEVOTHYROXINE SODIUM 25 MCG PO TABS: ORAL_TABLET | ORAL | Status: DC

## 2015-11-13 MED ORDER — FLUOXETINE HCL 20 MG PO CAPS: ORAL_CAPSULE | ORAL | Status: DC

## 2015-11-13 MED ORDER — ESTRADIOL 1 MG PO TABS
1.0000 mg | ORAL_TABLET | Freq: Every day | ORAL | Status: DC
Start: 2015-11-13 — End: 2016-01-27

## 2015-11-13 NOTE — Assessment & Plan Note (Signed)
Diarrhea x 2 weeks - up to 15 times a day, non bloody Diffuse abdominal pain on exam without rebound or guarding Rule out diverticulitis, bacterial diarrhea   Continue librax as needed, anti-nausea med as needed Ct scan, stool cx, blood work  Call if symptoms change

## 2015-11-13 NOTE — Assessment & Plan Note (Signed)
Diarrhea x 2 weeks - up to 15 times a day Diffuse abdominal pain on exam without rebound or guarding Rule out diverticulitis - ct scan Rule out bacterial diarrhea - stool cx, blood work  Continue librax as needed

## 2015-11-13 NOTE — Patient Instructions (Signed)
Blood work, stool cultures and a ct scan was ordered.  Test(s) ordered today. Your results will be released to [facility] (or called to you) after review, usually within 72hours after test completion. If any changes need to be made, you will be notified at that same time.   Medications reviewed and updated.  No changes recommended at this time.  Your prescription(s) have been submitted to your pharmacy. Please take as directed and contact our office if you believe you are having problem(s) with the medication(s).

## 2015-11-13 NOTE — Progress Notes (Signed)
Pre visit review using our clinic review tool, if applicable. No additional management support is needed unless otherwise documented below in the visit note. 

## 2015-11-13 NOTE — Progress Notes (Signed)
Subjective:    Patient ID: Laura Barnes, female    DOB: 1941/03/30, 75 y.o.   MRN: [id number]  HPI She is here for an acute visit for diarrhea.   Diarrhea:  She has had diarrhea for two weeks.  She is nauseous.  She has watery stools and goes up to 15 times a day.  The stool is yellow in color and burns when she has a bowel movement.  She denies blood in the stool.  It has gotten worse.  Her stomach is crampy at times.  She does take Librax and that helps.  She takes the medication for nausea.  She denies fever.  She has a decreased appetite.     She is drinking water, Gatorade and eating most foods.  She denies travel.  She denies antibiotics in the past 6 months.  She denies known sick contacts.    Medications and allergies reviewed with patient and updated if appropriate.  Patient Active Problem List   Diagnosis Date Noted  . Bilateral leg edema 11/05/2015  . Anterior neck pain 11/05/2015  . Postmenopausal 11/05/2015  . Subacromial bursitis 09/16/2015  . Umbilical pain 123XX123  . Fluid retention in legs 05/07/2015  . 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 Trochanteric bursitis of both hips 03/12/2015  . Subacromial impingement of left shoulder 03/12/2015  . Breast implant removal status 05/01/2014  . Hyperlipemia 05/01/2014  . Chronic pain syndrome 05/01/2014  . Lumbar radiculopathy 03/27/2014  . Left shoulder pain 11/07/2013  . Degenerative cervical disc 10/10/2013  . Fibromyalgia 09/06/2013  . Bilateral shoulder pain 09/06/2013  . Urinary frequency 08/12/2013  . Low back pain 08/12/2013  . Cough 04/12/2013  . Sinusitis, chronic 04/12/2013  . Hypothyroidism 04/07/2012  . Facial pain 10/01/2010  . VENOUS INSUFFICIENCY, CHRONIC 04/02/2010  . FOOT PAIN, BILATERAL 04/02/2010  . Enthesopathy of hip region 07/19/2007  . Osteoporosis 07/19/2007  . Anxiety state 01/23/2007  . Depression 01/23/2007  . Migraine 01/23/2007  .  NINAR (noninfectious nonallergic rhinitis) 01/23/2007  . GERD 01/23/2007  . IBS 01/23/2007    Current Outpatient Prescriptions on File Prior to Visit  Medication Sig Dispense Refill  . ALPRAZolam (XANAX) 0.5 MG tablet TAKE 1 TABLET BY MOUTH TWICE DAILY 60 tablet 2  . Amino Acids (AMINO ACID PO) Take 1 capsule by mouth 2 (two) times daily.    . B Complex-Biotin-FA (B COMPLETE) TABS Take 1 tablet by mouth daily. B Complete 100mg     . baclofen (LIORESAL) 10 MG tablet Take 1 tablet (10 mg total) by mouth 3 (three) times daily. 270 tablet 1  . benzonatate (TESSALON) 200 MG capsule TAKE 1 CAPSULE(200 MG) BY MOUTH THREE TIMES DAILY AS NEEDED FOR COUGH 90 capsule 5  . buPROPion (WELLBUTRIN XL) 300 MG 24 hr tablet TAKE 1 TABLET (300 MG TOTAL) BY MOUTH DAILY. 90 tablet 3  . calcium carbonate (OS-CAL) 600 MG TABS tablet Take 600 mg by mouth 2 (two) times daily with a meal.     . cetirizine (ZYRTEC) 10 MG tablet TAKE 1 TABLET (10 MG TOTAL) BY MOUTH DAILY. 30 tablet 11  . cholecalciferol (VITAMIN D) 1000 UNITS tablet Take 1,000 Units by mouth daily.    . clidinium-chlordiazePOXIDE (LIBRAX) 5-2.5 MG per capsule Take 1 capsule by mouth daily as needed (for stomach). 60 capsule 4  . Coenzyme Q10 (CO Q-10) 200 MG CAPS Take 1 capsule by mouth 2 (two) times daily.    Marland Kitchen esomeprazole (  NEXIUM) 40 MG capsule Take 1 capsule (40 mg total) by mouth 2 (two) times daily before a meal. 60 capsule 11  . estradiol (ESTRACE) 1 MG tablet Take 1 tablet (1 mg total) by mouth daily. 90 tablet 3  . FLUoxetine (PROZAC) 20 MG capsule TAKE 1 CAPSULE BY MOUTH EVERY DAY 90 capsule 0  . fluticasone (FLONASE) 50 MCG/ACT nasal spray Place 2 sprays into both nostrils daily. 16 g 2  . levothyroxine (SYNTHROID, LEVOTHROID) 25 MCG tablet TAKE 1 TABLET (25 MCG TOTAL) BY MOUTH DAILY BEFORE BREAKFAST. 90 tablet 1  . Meperidine HCl (DEMEROL PO) Take by mouth as needed. Takes for headaches unsure dose    . Multiple Vitamin (MULTIVITAMIN WITH  MINERALS) TABS tablet Take 1 tablet by mouth daily.    . Omega-3 Fatty Acids (SALMON OIL-1000 PO) Take 1,000 mg by mouth daily.    . ondansetron (ZOFRAN) 4 MG tablet Take 1 tablet (4 mg total) by mouth every 8 (eight) hours as needed for nausea or vomiting. 40 tablet 1  . topiramate (TOPAMAX) 50 MG tablet Take 50 mg by mouth daily. Take 50mg  (1 tablet) every other day.    . triamcinolone cream (KENALOG) 0.5 % Apply topically 2 (two) times daily. As needed for rash 30 g 0  . triamterene-hydrochlorothiazide (MAXZIDE-25) 37.5-25 MG tablet TAKE 1 TABLET BY MOUTH EVERY DAY 90 tablet 3  . TURMERIC PO Take by mouth.     No current facility-administered medications on file prior to visit.    Past Medical History  Diagnosis Date  . HYPERLIPIDEMIA   . ANXIETY   . DEPRESSION   . MIGRAINE, COMMON   . MIGRAINE HEADACHE   . VENOUS INSUFFICIENCY, CHRONIC   . SINUSITIS- ACUTE-NOS   . ALLERGIC RHINITIS   . GERD   . IBS   . VAGINITIS   . SKIN LESION   . Enthesopathy of hip region   . FOOT PAIN, BILATERAL   . PLANTAR FASCIITIS   . OSTEOPOROSIS   . OSTEOPENIA   . RASH-NONVESICULAR   . NECK MASS   . Hiatal hernia   . Hypothyroidism   . Bronchitis   . Anemia   . Complication of anesthesia     says one time waking up she couldn't breathe, felt like her throat closing up  . Family history of anesthesia complication     sister with n/v    Past Surgical History  Procedure Laterality Date  . Appendectomy    . Cholecystectomy    . 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 Oophorectomy    . Shoulder surgery    . S/p neck lump removal    . Breast enhancement surgery    . Tonsillectomy    . Colonoscopy    . Sinus endo w/fusion Right 06/30/2013    Procedure: RIGHT ENDOSCOPIC SPHENOIDECTOMY WITH FUSION SCAN;  Surgeon: [name], MD;  Location: [facility];  Service: ENT;  Laterality: Right;  . Vaginal hysterectomy      Social History   Social History  . Marital  Status: Married    Spouse Name: N/A  . Number of Children: 2  . Years of Education: N/A   Occupational History  . retired Press photographer    Social History Main Topics  . Smoking status: Never Smoker   . Smokeless tobacco: Never Used  . Alcohol Use: No  . Drug Use: No  . Sexual Activity: Not on file   Other Topics  Concern  . Not on file   Social History Narrative   Has 2 biological children and 1 step child    Family History  Problem Relation Age of Onset  . Diabetes Mother   . Diabetes Sister   . Breast cancer Maternal Grandmother   . Heart disease Maternal Uncle     x 2  . Diabetes Other     multi relatives on father side  . Heart disease Maternal Aunt   . Kidney disease Paternal Uncle     questionable  . Colon cancer Neg Hx   . Colon polyps Neg Hx   . Rectal cancer Neg Hx   . Stomach cancer Neg Hx   . Heart disease Father     Review of Systems  Gastrointestinal: Positive for nausea and abdominal pain (crampy). Negative for vomiting and blood in stool.  Neurological: Positive for headaches. Negative for light-headedness.       Objective:   Filed Vitals:   11/13/15 0848  BP: 134/86  Pulse: 95  Temp: 98.1 F (36.7 C)  Resp: 16   Filed Weights   11/13/15 0848  Weight: 149 lb (67.586 kg)   Body mass index is 29.1 kg/(m^2).   Physical Exam  Constitutional: She appears well-developed and well-nourished. No distress.  Cardiovascular: Normal rate, regular rhythm and normal heart sounds.   No murmur heard. Pulmonary/Chest: Effort normal and breath sounds normal. No respiratory distress. She has no wheezes. She has no rales.  Abdominal: Soft. She exhibits no distension and no mass. There is tenderness (moderate pain - diffuse). There is no rebound and no guarding.  Musculoskeletal: She exhibits edema (mild).  Skin: Skin is warm and dry. She is not diaphoretic.          Assessment & Plan:    See Problem List for Assessment and Plan of chronic medical  problems.

## 2015-11-14 LAB — C. DIFFICILE GDH AND TOXIN A/B
C. difficile GDH: NOT DETECTED
C. difficile Toxin A/B: NOT DETECTED

## 2015-11-15 ENCOUNTER — Ambulatory Visit (INDEPENDENT_AMBULATORY_CARE_PROVIDER_SITE_OTHER)
Admission: RE | Admit: 2015-11-15 | Discharge: 2015-11-15 | Disposition: A | Discharge status: Home / Self Care | Payer: Medicare Other | Source: Ambulatory Visit | Attending: Internal Medicine | Admitting: Internal Medicine

## 2015-11-15 DIAGNOSIS — R1084 Generalized abdominal pain: Secondary | ICD-10-CM

## 2015-11-15 DIAGNOSIS — M542 Cervicalgia: Secondary | ICD-10-CM

## 2015-11-15 DIAGNOSIS — R109 Unspecified abdominal pain: Secondary | ICD-10-CM | POA: Diagnosis not present

## 2015-11-15 DIAGNOSIS — R197 Diarrhea, unspecified: Secondary | ICD-10-CM | POA: Diagnosis not present

## 2015-11-15 MED ORDER — IOPAMIDOL (ISOVUE-300) INJECTION 61%
100.0000 mL | Freq: Once | INTRAVENOUS | Status: AC | PRN
  Administered 2015-11-15: 100 mL via INTRAVENOUS

## 2015-11-16 ENCOUNTER — Encounter: Payer: Self-pay | Admitting: Internal Medicine

## 2015-11-16 DIAGNOSIS — E041 Nontoxic single thyroid nodule: Secondary | ICD-10-CM | POA: Insufficient documentation

## 2015-11-17 LAB — STOOL CULTURE

## 2015-11-18 ENCOUNTER — Telehealth: Payer: Self-pay

## 2015-11-18 NOTE — Telephone Encounter (Signed)
Noted  

## 2015-11-18 NOTE — Telephone Encounter (Signed)
Patient called back. I informed her of the notes. She understood. She also states that everything has slowed down and she has been feeling some better. Thank you.

## 2015-11-25 ENCOUNTER — Telehealth: Payer: Self-pay | Admitting: Internal Medicine

## 2015-11-25 DIAGNOSIS — E041 Nontoxic single thyroid nodule: Secondary | ICD-10-CM

## 2015-11-25 DIAGNOSIS — M542 Cervicalgia: Secondary | ICD-10-CM

## 2015-11-25 NOTE — Telephone Encounter (Signed)
Patient states Dr. Quay Burow has been treating her from diarrhea.  Patient states she believes she has gotten off of topamax thus causing the diarrhea.  Please follow back up with patient.

## 2015-11-26 NOTE — Telephone Encounter (Signed)
Spoke with pt, states she is completely off Topamax and her symptoms are starting to resolve, states she read that this could be a major side effect of Topamax. Pt is also coming off of Baclofen and nexium she is taking 20mg  in morning and 20mg . She would also like to know if she needs to do anything for the Thyroid Nodule, such as see ENT

## 2015-11-27 ENCOUNTER — Ambulatory Visit (INDEPENDENT_AMBULATORY_CARE_PROVIDER_SITE_OTHER)
Admission: RE | Admit: 2015-11-27 | Discharge: 2015-11-27 | Disposition: A | Discharge status: Home / Self Care | Payer: Medicare Other | Source: Ambulatory Visit | Attending: Internal Medicine | Admitting: Internal Medicine

## 2015-11-27 DIAGNOSIS — M810 Age-related osteoporosis without current pathological fracture: Secondary | ICD-10-CM | POA: Diagnosis not present

## 2015-11-27 NOTE — Telephone Encounter (Signed)
Yes, she should see ent for her neck pain and thyroid nodule.  I did send her a message via mychart regarding this....... Please take her off of mychart if she does not use it.  Referral ordered for ENT.

## 2015-11-27 NOTE — Telephone Encounter (Signed)
Pt informed

## 2015-12-03 ENCOUNTER — Encounter: Payer: Self-pay | Admitting: Internal Medicine

## 2015-12-03 DIAGNOSIS — M8580 Other specified disorders of bone density and structure, unspecified site: Secondary | ICD-10-CM | POA: Insufficient documentation

## 2015-12-16 ENCOUNTER — Ambulatory Visit: Payer: Medicare Other | Admitting: Registered Nurse

## 2015-12-18 ENCOUNTER — Encounter: Payer: Self-pay | Admitting: Family

## 2015-12-18 ENCOUNTER — Other Ambulatory Visit (INDEPENDENT_AMBULATORY_CARE_PROVIDER_SITE_OTHER): Payer: Medicare Other

## 2015-12-18 ENCOUNTER — Ambulatory Visit (INDEPENDENT_AMBULATORY_CARE_PROVIDER_SITE_OTHER): Payer: Medicare Other | Admitting: Family

## 2015-12-18 VITALS — BP 112/82 | HR 83 | Temp 98.7°F | Wt 149.4 lb

## 2015-12-18 DIAGNOSIS — R42 Dizziness and giddiness: Secondary | ICD-10-CM

## 2015-12-18 LAB — COMPREHENSIVE METABOLIC PANEL
ALT: 20 U/L (ref 0–35)
AST: 24 U/L (ref 0–37)
Albumin: 4.1 g/dL (ref 3.5–5.2)
Alkaline Phosphatase: 44 U/L (ref 39–117)
BUN: 12 mg/dL (ref 6–23)
CO2: 28 mEq/L (ref 19–32)
Calcium: 9.9 mg/dL (ref 8.4–10.5)
Chloride: 94 mEq/L — ABNORMAL LOW (ref 96–112)
Creatinine, Ser: 0.92 mg/dL (ref 0.40–1.20)
GFR: 63.26 mL/min (ref >60.00)
Glucose, Bld: 86 mg/dL (ref 70–99)
Potassium: 3.9 mEq/L (ref 3.5–5.1)
Sodium: 130 mEq/L — ABNORMAL LOW (ref 135–145)
Total Bilirubin: 0.4 mg/dL (ref 0.2–1.2)
Total Protein: 7.6 g/dL (ref 6.0–8.3)

## 2015-12-18 LAB — CBC
HCT: 38.8 % (ref 36.0–46.0)
Hemoglobin: 13.1 g/dL (ref 12.0–15.0)
MCHC: 33.8 g/dL (ref 30.0–36.0)
MCV: 92.1 fl (ref 78.0–100.0)
Platelets: 261 10*3/uL (ref 150.0–400.0)
RBC: 4.22 Mil/uL (ref 3.87–5.11)
RDW: 14 % (ref 11.5–15.5)
WBC: 9.6 10*3/uL (ref 4.0–10.5)

## 2015-12-18 NOTE — Progress Notes (Signed)
Subjective:    Patient ID: Laura Barnes, female    DOB: [birth year], 75 y.o.   MRN: [id number]  Chief Complaint  Patient presents with  . Dizziness    Pt states she started having sxs about 3-4 days ago  . Nausea    HPI:  Laura Barnes is a 75 y.o. female who  has a past medical history of HYPERLIPIDEMIA; ANXIETY; DEPRESSION; MIGRAINE, COMMON; MIGRAINE HEADACHE; VENOUS INSUFFICIENCY, CHRONIC; SINUSITIS- ACUTE-NOS; ALLERGIC RHINITIS; GERD; IBS; VAGINITIS; SKIN LESION; Enthesopathy of hip region; FOOT PAIN, BILATERAL; PLANTAR FASCIITIS; OSTEOPOROSIS; OSTEOPENIA; RASH-NONVESICULAR; NECK MASS; Hiatal hernia; Hypothyroidism; Bronchitis; Anemia; Complication of anesthesia; and Family history of anesthesia complication. and presents today for an acute office visit.  This is a new problem. Associated symptoms of dizziness and nausea have been going on for about 3-4 days. Describes that she feels like she is going to lose her balance at time. Modifying factors include ondanestron which has not helped very much. Possible subjective fever. Drinks about about 40 oz of water and caffeine in the morning and decafinated ice tea in the evening. Denies abdominal pain. Dizziness feels more like lightheadedness. Endorses an episode of diarrhea for about 1 week. Denies any trauma to the head. Some congestion.   Allergies  Allergen Reactions  . Prednisone Anaphylaxis    Patient unable to remember why she needed to steroid shot but just knows that when she got back to work she started having a lot of trouble breathing.  (jkl 05/04/14)  . Aspirin Other (See Comments)    Stomach ache and bleed  . Erythromycin Diarrhea  . Latex Other (See Comments)    blisters  . Levofloxacin Other (See Comments)    Headaches, GI upset  . Nsaids Other (See Comments)    stomach bleeding per pt  . Statins Nausea Only and Other (See Comments)    Headache, upset stomach, joint hurt, heartburn  . Sumatriptan Hives and  Palpitations  . Adhesive [Tape] Other (See Comments)    blisters  . Amoxicillin-Pot Clavulanate   . Doxycycline   . Ivp Dye [Iodinated Diagnostic Agents]     If made with blue shellfish then CAN NOT have this type of dye.   . Methylphenidate Hcl   . Mirtazapine   . Shellfish Allergy     Blue shellfish  . Hydrocodone Itching  . Hydrocodone-Acetaminophen Itching  . Rofecoxib Nausea Only  . Sertraline Hcl Nausea Only  . Sulfonamide Derivatives Itching     Current Outpatient Prescriptions on File Prior to Visit  Medication Sig Dispense Refill  . ALPRAZolam (XANAX) 0.5 MG tablet TAKE 1 TABLET BY MOUTH TWICE DAILY 60 tablet 2  . Amino Acids (AMINO ACID PO) Take 1 capsule by mouth 2 (two) times daily.    . B Complex-Biotin-FA (B COMPLETE) TABS Take 1 tablet by mouth daily. B Complete 100mg     . baclofen (LIORESAL) 10 MG tablet Take 1 tablet (10 mg total) by mouth 3 (three) times daily. 270 tablet 1  . benzonatate (TESSALON) 200 MG capsule TAKE 1 CAPSULE(200 MG) BY MOUTH THREE TIMES DAILY AS NEEDED FOR COUGH 90 capsule 5  . buPROPion (WELLBUTRIN XL) 300 MG 24 hr tablet TAKE 1 TABLET (300 MG TOTAL) BY MOUTH DAILY. 90 tablet 3  . calcium carbonate (OS-CAL) 600 MG TABS tablet Take 600 mg by mouth 2 (two) times daily with a meal.     . cetirizine (ZYRTEC) 10 MG tablet TAKE 1 TABLET (10 MG TOTAL) BY MOUTH  DAILY. 30 tablet 11  . cholecalciferol (VITAMIN D) 1000 UNITS tablet Take 1,000 Units by mouth daily.    . clidinium-chlordiazePOXIDE (LIBRAX) 5-2.5 MG per capsule Take 1 capsule by mouth daily as needed (for stomach). 60 capsule 4  . Coenzyme Q10 (CO Q-10) 200 MG CAPS Take 1 capsule by mouth 2 (two) times daily.    Marland Kitchen estradiol (ESTRACE) 1 MG tablet Take 1 tablet (1 mg total) by mouth daily. 90 tablet 3  . FLUoxetine (PROZAC) 20 MG capsule TAKE 1 CAPSULE BY MOUTH EVERY DAY 90 capsule 0  . fluticasone (FLONASE) 50 MCG/ACT nasal spray Place 2 sprays into both nostrils daily. 16 g 2  .  levothyroxine (SYNTHROID, LEVOTHROID) 25 MCG tablet TAKE 1 TABLET (25 MCG TOTAL) BY MOUTH DAILY BEFORE BREAKFAST. 90 tablet 1  . Meperidine HCl (DEMEROL PO) Take by mouth as needed. Takes for headaches unsure dose    . Multiple Vitamin (MULTIVITAMIN WITH MINERALS) TABS tablet Take 1 tablet by mouth daily.    . Omega-3 Fatty Acids (SALMON OIL-1000 PO) Take 1,000 mg by mouth daily.    . ondansetron (ZOFRAN) 4 MG tablet Take 1 tablet (4 mg total) by mouth every 8 (eight) hours as needed for nausea or vomiting. 40 tablet 1  . Potassium 99 MG TABS Take 99 mg by mouth daily.    Marland Kitchen triamcinolone cream (KENALOG) 0.5 % Apply topically 2 (two) times daily. As needed for rash 30 g 0  . triamterene-hydrochlorothiazide (MAXZIDE-25) 37.5-25 MG tablet TAKE 1 TABLET BY MOUTH EVERY DAY 90 tablet 3  . TURMERIC PO Take by mouth.     No current facility-administered medications on file prior to visit.     Past Surgical History  Procedure Laterality Date  . Appendectomy    . Cholecystectomy    . 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 Oophorectomy    . Shoulder surgery    . S/p neck lump removal    . Breast enhancement surgery    . Tonsillectomy    . Colonoscopy    . Sinus endo w/fusion Right 06/30/2013    Procedure: RIGHT ENDOSCOPIC SPHENOIDECTOMY WITH FUSION SCAN;  Surgeon: [name], MD;  Location: [facility];  Service: ENT;  Laterality: Right;  . Vaginal hysterectomy      Past Medical History  Diagnosis Date  . HYPERLIPIDEMIA   . ANXIETY   . DEPRESSION   . MIGRAINE, COMMON   . MIGRAINE HEADACHE   . VENOUS INSUFFICIENCY, CHRONIC   . SINUSITIS- ACUTE-NOS   . ALLERGIC RHINITIS   . GERD   . IBS   . VAGINITIS   . SKIN LESION   . Enthesopathy of hip region   . FOOT PAIN, BILATERAL   . PLANTAR FASCIITIS   . OSTEOPOROSIS   . OSTEOPENIA   . RASH-NONVESICULAR   . NECK MASS   . Hiatal hernia   . Hypothyroidism   . Bronchitis   . Anemia   . Complication of  anesthesia     says one time waking up she couldn't breathe, felt like her throat closing up  . Family history of anesthesia complication     sister with n/v     Review of Systems  Constitutional: Negative for fever and chills.  Respiratory: Negative for chest tightness and shortness of breath.   Gastrointestinal: Positive for nausea and diarrhea. Negative for vomiting, abdominal pain, blood in stool and abdominal distention.  Neurological: Positive for dizziness and  light-headedness. Negative for syncope, weakness and headaches.      Objective:    BP 112/82 mmHg  Pulse 83  Temp(Src) 98.7 F (37.1 C) (Oral)  Wt 149 lb 6.4 oz (67.767 kg)  SpO2 94% Nursing note and vital signs reviewed.  Physical Exam  Constitutional: She is oriented to person, place, and time. She appears well-developed and well-nourished. No distress.  HENT:  Right Ear: Hearing, tympanic membrane, external ear and ear canal normal.  Left Ear: Hearing, tympanic membrane, external ear and ear canal normal.  Nose: Nose normal.  Mouth/Throat: Uvula is midline, oropharynx is clear and moist and mucous membranes are normal.  Eyes: Conjunctivae and EOM are normal. Pupils are equal, round, and reactive to light.  Cardiovascular: Normal rate, regular rhythm, normal heart sounds and intact distal pulses.   Pulmonary/Chest: Effort normal and breath sounds normal.  Abdominal: Soft. Bowel sounds are normal. She exhibits no distension and no mass. There is no tenderness. There is no rebound and no guarding.  Neurological: She is alert and oriented to person, place, and time. No cranial nerve deficit.  Skin: Skin is warm and dry.  Psychiatric: She has a normal mood and affect. Her behavior is normal. Judgment and thought content normal.       Assessment & Plan:   Problem List Items Addressed This Visit      Other   Lightheadedness - Primary    Lightheadedness most likely associated with recent bouts of diarrhea and  decreased fluid intake secondary to IBS resulting in dehydration. Abdominal and neurological exams are normal. Obtain CBC and complete metabolic profile. Encouraged to drink plenty of non-caffeinated/nonalcoholic beverages with goal of urine being clear. Change position slowly. Follow-up if symptoms worsen or fail to improve prior to blood work.      Relevant Orders   Comprehensive metabolic panel   CBC       I have discontinued Ms. Dahan's topiramate. I am also having her maintain her triamcinolone cream, Co Q-10, calcium carbonate, cholecalciferol, multivitamin with minerals, Omega-3 Fatty Acids (SALMON OIL-1000 PO), B COMPLETE, Meperidine HCl (DEMEROL PO), cetirizine, fluticasone, TURMERIC PO, clidinium-chlordiazePOXIDE, Amino Acids (AMINO ACID PO), ondansetron, buPROPion, benzonatate, ALPRAZolam, baclofen, triamterene-hydrochlorothiazide, Potassium, estradiol, FLUoxetine, levothyroxine, and esomeprazole.   Meds ordered this encounter  Medications  . esomeprazole (NEXIUM) 20 MG capsule    Sig: Take 20 mg by mouth daily at 12 noon.     Follow-up: Return if symptoms worsen or fail to improve.  Mauricio Po, FNP

## 2015-12-18 NOTE — Progress Notes (Signed)
Pre visit review using our clinic review tool, if applicable. No additional management support is needed unless otherwise documented below in the visit note. 

## 2015-12-18 NOTE — Assessment & Plan Note (Addendum)
Lightheadedness most likely associated with recent bouts of diarrhea and decreased fluid intake secondary to IBS resulting in dehydration. Abdominal and neurological exams are normal. Obtain CBC and complete metabolic profile. Encouraged to drink plenty of non-caffeinated/nonalcoholic beverages with goal of urine being clear. Change position slowly. Continue currently prescribed Zofran and may increase to 8 mg if needed. Follow-up if symptoms worsen or fail to improve prior to blood work.

## 2015-12-18 NOTE — Patient Instructions (Signed)
Thank you for choosing [facility].  Summary/Instructions:  Drink plenty of nonalcoholic/non-caffeinated beverages. Goal is for urine to be clear for the next 2-3 days.  Change position slowly.  May take the Zofran 8mg  every 8 hours as needed.   Please stop by the lab on the lower level of the building for your blood work. Your results will be released to [facility] (or called to you) after review, usually within 72 hours after test completion. If any changes need to be made, you will be notified at that same time.  1. The lab is open from 7:30am to 5:30 pm Monday-Friday  2. No appointment is necessary  3. Fasting (if needed) is 6-8 hours after food and drink; black coffee  and water are okay   If your symptoms worsen or fail to improve, please contact our office for further instruction, or in case of emergency go directly to the emergency room at the closest medical facility.

## 2015-12-30 ENCOUNTER — Other Ambulatory Visit: Payer: Self-pay | Admitting: Internal Medicine

## 2016-01-05 ENCOUNTER — Other Ambulatory Visit: Payer: Self-pay | Admitting: Internal Medicine

## 2016-01-06 DIAGNOSIS — E041 Nontoxic single thyroid nodule: Secondary | ICD-10-CM | POA: Diagnosis not present

## 2016-01-08 ENCOUNTER — Other Ambulatory Visit: Payer: Self-pay | Admitting: Otolaryngology

## 2016-01-08 DIAGNOSIS — M50321 Other cervical disc degeneration at C4-C5 level: Secondary | ICD-10-CM | POA: Diagnosis not present

## 2016-01-08 DIAGNOSIS — E041 Nontoxic single thyroid nodule: Secondary | ICD-10-CM

## 2016-01-08 DIAGNOSIS — M9901 Segmental and somatic dysfunction of cervical region: Secondary | ICD-10-CM | POA: Diagnosis not present

## 2016-01-08 DIAGNOSIS — Q72812 Congenital shortening of left lower limb: Secondary | ICD-10-CM | POA: Diagnosis not present

## 2016-01-08 DIAGNOSIS — M25552 Pain in left hip: Secondary | ICD-10-CM | POA: Diagnosis not present

## 2016-01-08 DIAGNOSIS — M9905 Segmental and somatic dysfunction of pelvic region: Secondary | ICD-10-CM | POA: Diagnosis not present

## 2016-01-09 ENCOUNTER — Other Ambulatory Visit: Payer: Self-pay | Admitting: Otolaryngology

## 2016-01-09 DIAGNOSIS — M50321 Other cervical disc degeneration at C4-C5 level: Secondary | ICD-10-CM | POA: Diagnosis not present

## 2016-01-09 DIAGNOSIS — M25552 Pain in left hip: Secondary | ICD-10-CM | POA: Diagnosis not present

## 2016-01-09 DIAGNOSIS — E041 Nontoxic single thyroid nodule: Secondary | ICD-10-CM

## 2016-01-09 DIAGNOSIS — M9905 Segmental and somatic dysfunction of pelvic region: Secondary | ICD-10-CM | POA: Diagnosis not present

## 2016-01-09 DIAGNOSIS — Q72812 Congenital shortening of left lower limb: Secondary | ICD-10-CM | POA: Diagnosis not present

## 2016-01-09 DIAGNOSIS — M9901 Segmental and somatic dysfunction of cervical region: Secondary | ICD-10-CM | POA: Diagnosis not present

## 2016-01-20 ENCOUNTER — Ambulatory Visit
Admission: RE | Admit: 2016-01-20 | Discharge: 2016-01-20 | Disposition: A | Discharge status: Home / Self Care | Payer: Medicare Other | Source: Ambulatory Visit | Attending: Otolaryngology | Admitting: Otolaryngology

## 2016-01-20 DIAGNOSIS — E042 Nontoxic multinodular goiter: Secondary | ICD-10-CM | POA: Diagnosis not present

## 2016-01-20 DIAGNOSIS — E041 Nontoxic single thyroid nodule: Secondary | ICD-10-CM

## 2016-01-23 ENCOUNTER — Other Ambulatory Visit: Payer: Self-pay | Admitting: Internal Medicine

## 2016-01-27 ENCOUNTER — Other Ambulatory Visit: Payer: Self-pay | Admitting: Internal Medicine

## 2016-02-04 ENCOUNTER — Other Ambulatory Visit (HOSPITAL_COMMUNITY)
Admission: RE | Admit: 2016-02-04 | Discharge: 2016-02-04 | Disposition: A | Discharge status: Home / Self Care | Payer: Medicare Other | Source: Ambulatory Visit | Attending: Radiology | Admitting: Radiology

## 2016-02-04 ENCOUNTER — Ambulatory Visit
Admission: RE | Admit: 2016-02-04 | Discharge: 2016-02-04 | Disposition: A | Discharge status: Home / Self Care | Payer: Medicare Other | Source: Ambulatory Visit | Attending: Otolaryngology | Admitting: Otolaryngology

## 2016-02-04 DIAGNOSIS — E041 Nontoxic single thyroid nodule: Secondary | ICD-10-CM

## 2016-02-10 ENCOUNTER — Encounter: Payer: Self-pay | Admitting: Internal Medicine

## 2016-02-12 ENCOUNTER — Other Ambulatory Visit: Payer: Self-pay | Admitting: Internal Medicine

## 2016-02-28 ENCOUNTER — Encounter: Payer: Self-pay | Admitting: Internal Medicine

## 2016-02-28 ENCOUNTER — Other Ambulatory Visit: Payer: Medicare Other

## 2016-02-28 ENCOUNTER — Ambulatory Visit (INDEPENDENT_AMBULATORY_CARE_PROVIDER_SITE_OTHER): Payer: Medicare Other | Admitting: Internal Medicine

## 2016-02-28 VITALS — BP 132/80 | HR 100 | Temp 98.5°F | Resp 16 | Ht 60.0 in | Wt 143.0 lb

## 2016-02-28 DIAGNOSIS — R3911 Hesitancy of micturition: Secondary | ICD-10-CM | POA: Diagnosis not present

## 2016-02-28 DIAGNOSIS — R300 Dysuria: Secondary | ICD-10-CM

## 2016-02-28 LAB — POCT URINALYSIS DIPSTICK
Bilirubin, UA: NEGATIVE
Blood, UA: NEGATIVE
Glucose, UA: NEGATIVE
Ketones, UA: NEGATIVE
Leukocytes, UA: NEGATIVE
Nitrite, UA: NEGATIVE
Protein, UA: NEGATIVE
Spec Grav, UA: 1.02
Urobilinogen, UA: NEGATIVE
pH, UA: 6

## 2016-02-28 NOTE — Patient Instructions (Signed)
You do not have signs of infection or blood in the urine.   We are sending it for culture to be sure there are no bacteria.   If the culture comes back negative we may need to have you see a urologist to help [place] figure out your symptoms.

## 2016-02-28 NOTE — Progress Notes (Signed)
   Subjective:    Patient ID: Laura Barnes, female    DOB: 1941/04/09, 75 y.o.   MRN: [id number]  HPI The patient is a 75 YO female coming in for urinary hesitancy for about 6 weeks. In the last week she has had some pain in the urethra. Denies pain with urination. Mild suprapubic pressure. No new back pain. She has many other complaints and concerns which we are not able to address due to the acuity of the visit.   Review of Systems  Constitutional: Negative for activity change, appetite change, fatigue, fever and unexpected weight change.  Respiratory: Negative.   Cardiovascular: Negative.   Gastrointestinal: Negative.   Genitourinary: Positive for decreased urine volume, difficulty urinating, dysuria and urgency. Negative for enuresis, flank pain, frequency and hematuria.      Objective:   Physical Exam  Constitutional: She is oriented to person, place, and time. She appears well-developed and well-nourished.  HENT:  Head: Normocephalic and atraumatic.  Eyes: EOM are normal.  Cardiovascular: Normal rate and regular rhythm.   Pulmonary/Chest: Effort normal and breath sounds normal.  Abdominal: Soft. Bowel sounds are normal. She exhibits no distension. There is no tenderness. There is no rebound and no guarding.  Neurological: She is alert and oriented to person, place, and time.  Skin: Skin is warm and dry.   Vitals:   02/28/16 1318  BP: (!) 160/80  Pulse: 100  Resp: 16  Temp: 98.5 F (36.9 C)  TempSrc: Oral  SpO2: 97%  Weight: 143 lb (64.9 kg)  Height: 5' (1.524 m)      Assessment & Plan:

## 2016-02-28 NOTE — Progress Notes (Signed)
Pre visit review using our clinic review tool, if applicable. No additional management support is needed unless otherwise documented below in the visit note. 

## 2016-02-28 NOTE — Addendum Note (Signed)
Addended by: Resa Miner R on: 02/28/2016 03:25 PM   Modules accepted: Orders

## 2016-02-28 NOTE — Assessment & Plan Note (Signed)
U/a without signs of infection. Sending for culture. No stones or abnormality of urinary system on recent CT (reviewed during visit with patient). If no culture positive will refer to urology for this ongoing problem. Advised to drink more liquids to help with flow.

## 2016-02-29 ENCOUNTER — Other Ambulatory Visit: Payer: Self-pay | Admitting: Internal Medicine

## 2016-03-02 ENCOUNTER — Encounter: Payer: Self-pay | Admitting: Internal Medicine

## 2016-03-02 ENCOUNTER — Telehealth: Payer: Self-pay | Admitting: Internal Medicine

## 2016-03-02 DIAGNOSIS — M797 Fibromyalgia: Secondary | ICD-10-CM | POA: Diagnosis not present

## 2016-03-02 DIAGNOSIS — G44219 Episodic tension-type headache, not intractable: Secondary | ICD-10-CM | POA: Diagnosis not present

## 2016-03-02 DIAGNOSIS — G43009 Migraine without aura, not intractable, without status migrainosus: Secondary | ICD-10-CM | POA: Diagnosis not present

## 2016-03-02 LAB — CULTURE, URINE COMPREHENSIVE

## 2016-03-02 NOTE — Telephone Encounter (Signed)
Spoke with pt, states Friday night breathing was heavy, by Saturday in to Sunday it was all she could do to catch her breath. Gets worse in the afternoon and night. Can not lay down at night to sleep has to sit on cough to sleep. States she is having a choking feeling in her throat. Airway feels irritated.   Pt only wants to see Dr Quay Burow. Do not have any openings tomorrow. Pt is a pt of Dr Bertrum Sol, transferred pt to Dr Bertrum Sol office to see if he had openings tomorrow. She is to call back if they do not have any openings tomorrow.

## 2016-03-02 NOTE — Telephone Encounter (Signed)
RX faxEd to [facility]

## 2016-03-02 NOTE — Telephone Encounter (Signed)
Please advise 

## 2016-03-02 NOTE — Telephone Encounter (Signed)
[facility]    --------------------------------------------------------------------------------   Patient Name: Laura Barnes  Gender: Female  DOB: 06-26-1940   Age: 75 Y 2 M 2 D  Return Phone Number: [phone] (Primary)  Address:     City/State/Zip:  [place]     Client [facility] - Client  Client Site [facility] Day  Physician Billey Gosling - MD  Contact Type Call  Who Is Calling Patient / Member / Family / Caregiver  Call Type Triage / Clinical  Relationship To Patient Self  Return Phone Number [phone] (Primary)  Chief Complaint BREATHING - shortness of breath or sounds breathless  Reason for Call Symptomatic / Request for [place] states she is having trouble breathing and catching her breath.   Appointment Disposition EMR Patient Reports Appointment Already Scheduled  Info pasted into Epic Yes  PreDisposition Call Doctor  Translation No       Nurse Assessment  Nurse: Venia Minks, RN, Melissa Date/Time (Eastern Time): 03/02/2016 3:01:58 PM  Confirm and document reason for call. If symptomatic, describe symptoms. You must click the next button to save text entered. ---Caller states she is having trouble breathing and catching her breath.    Has the patient traveled out of the country within the last 30 days? ---Not Applicable    Does the patient have any new or worsening symptoms? ---Yes    Will a triage be completed? ---Yes    Related visit to physician within the last 2 weeks? ---No    Does the PT have any chronic conditions? (i.e. diabetes, asthma, etc.) ---Yes    List chronic conditions. ---anxiety, depression, hypothyroidism , Migraine, chronic venous insufficiency, hs of rhinitis, sinusitis, GERD, IBS, degenerative cervical disc, shoulder, back and hip problems, osteopenia    Is this a behavioral health  or substance abuse call? ---No           Guidelines          Guideline Title Affirmed Question Affirmed Notes Nurse Date/Time (Eastern Time)  Choking - Inhaled Foreign Body [1] Choking has occurred 3 or more times in the last year AND [2] the cause is not known    Venia Minks, RN, Melissa 03/02/2016 3:10:19 PM    Disp. Time Eilene Ghazi Time) Disposition Final User    03/02/2016 2:58:49 PM Send to Urgent Queue   Honor Loh      03/02/2016 3:15:38 PM See PCP When Office is Open (within 3 days) Yes Venia Minks, RN, Lenna Sciara            Caller Understands: Yes  Disagree/Comply: Comply       Care Advice Given Per Guideline        SEE PCP WITHIN 3 DAYS:        --------------------------------------------------------------------------------            Referrals  REFERRED TO PCP OFFICE             Comments  User: Linde Gillis, RN Date/Time (Eastern Time): 03/02/2016 3:23:03 PM  Caller denies SOB is bad at this time, "only a little bit" but caller able to make long sentences and paragraphs of information without sounding SOB or stopping to get her breath. Caller reports she has had anxiety problems "all my life", also reveals she has presently a nodule on her thyroid which she sees a specialist for but has not had removed.  Referrals  REFERRED TO PCP OFFICE

## 2016-03-02 NOTE — Telephone Encounter (Signed)
She has an appt tomorrow with pulmonary

## 2016-03-02 NOTE — Telephone Encounter (Signed)
Spoke with pt and she has increasing cough and ShOB. She states that she is fine to wait until tomorrow to be seen. Appt made with MR @ 2pm. Pt aware. Nothing further needed.

## 2016-03-02 NOTE — Telephone Encounter (Signed)
Call her -- when did SOB start -and how bad is it?  What other symptoms is she having?  Needs to be evaluated asap.

## 2016-03-03 ENCOUNTER — Ambulatory Visit (INDEPENDENT_AMBULATORY_CARE_PROVIDER_SITE_OTHER): Payer: Medicare Other | Admitting: Internal Medicine

## 2016-03-03 ENCOUNTER — Encounter: Payer: Self-pay | Admitting: Internal Medicine

## 2016-03-03 VITALS — BP 138/88 | HR 91 | Ht 60.0 in | Wt 144.0 lb

## 2016-03-03 DIAGNOSIS — R053 Chronic cough: Secondary | ICD-10-CM

## 2016-03-03 DIAGNOSIS — R0602 Shortness of breath: Secondary | ICD-10-CM | POA: Insufficient documentation

## 2016-03-03 DIAGNOSIS — R0600 Dyspnea, unspecified: Secondary | ICD-10-CM | POA: Diagnosis not present

## 2016-03-03 DIAGNOSIS — R05 Cough: Secondary | ICD-10-CM | POA: Diagnosis not present

## 2016-03-03 DIAGNOSIS — R0689 Other abnormalities of breathing: Secondary | ICD-10-CM | POA: Diagnosis not present

## 2016-03-03 LAB — NITRIC OXIDE: Nitric Oxide: 16

## 2016-03-03 NOTE — Patient Instructions (Addendum)
ICD-9-CM ICD-10-CM   1. Chronic cough 786.2 R05   2. Dyspnea and respiratory abnormality 786.09 R06.00     R06.89     Do PFT Do HRCT chest  followup Return after above next few weeks to see me or Dr Annamaria Boots or [facility]

## 2016-03-03 NOTE — Progress Notes (Signed)
Subjective:     Patient ID: Laura Barnes, female   DOB: 12/16/1940, 75 y.o.   MRN: [id number]  HPI     OV 03/03/2016  Chief Complaint  Patient presents with  . Acute Visit    Pt c/o increase in SOB, dry cough, throat irritation, 2 pillow orthopnea. Pt denies CP/tightness, chest congestion, f/c/s.   Acute visit for deterioration of chronic cough  75 year old female history gained from her and review of the old chart. According to the patient she has had chronic cough for 8 years which has no etiology. In early 2016 in mid 2016 she was seen by Dr. Annamaria Boots after having failed basic therapies with the primary care physician. Allergy testing was essentially negative except for mild elevation IgE against soy. She is not using  soy anyway and since then she's completely avoided it. However this did not help the cough. In between she is tried empiric long-acting anticholinergic which has not helped. For many years she's been on Tessalon cough Perles which helps take the edge off. Her cough is present day and night. It feels as though everything is closing in on her. The cough is associated with gag. She has chronic sinus issues for which Dr. Wilburn Cornelia has done surgery in the last few months . She also has ongoing acid reflux. She used to see Dr. Sharlett Iles  GI but is on PPI currentl but has frequent breakthrough. Medication review shows she is on turmeric and also omega-3 fatty acids all of which can provoke acid reflux. She's also on PPI. She's on antihistamine and nasal steroid.  Currently she reports cough has been worse for the last few to several weeks in the last few to shortness of breath that is more a chest tightness is also present. There is no associated fever of phlegm or edema or orthopnea paroxysmal nocturnal dyspnea. Her Exhaled nitric oxide today in the office  03/03/2016 - 16ppb and normal  She has ffibromyalgia and is wondring if cough is related   Review of the chart shows last  chest x-ray was April 2016 and clear. I personally visualized and done. She's never had CT chest.  Now had pulmonary function testing.   has a past medical history of ALLERGIC RHINITIS; Anemia; ANXIETY; Bronchitis; Complication of anesthesia; DEPRESSION; Enthesopathy of hip region; Family history of anesthesia complication; FOOT PAIN, BILATERAL; GERD; Hiatal hernia; HYPERLIPIDEMIA; Hypothyroidism; IBS; MIGRAINE HEADACHE; MIGRAINE, COMMON; NECK MASS; OSTEOPENIA; OSTEOPOROSIS; PLANTAR FASCIITIS; RASH-NONVESICULAR; SINUSITIS- ACUTE-NOS; SKIN LESION; VAGINITIS; and VENOUS INSUFFICIENCY, CHRONIC.   reports that she has never smoked. She has never used smokeless tobacco.  Past Surgical History:  Procedure Laterality Date  . APPENDECTOMY    . BREAST ENHANCEMENT SURGERY    . CHOLECYSTECTOMY    . COLONOSCOPY    . MASTECTOMY     bilateral for severe bilateral fibrocystic disease  . OOPHORECTOMY    . s/p neck lump removal    . SHOULDER SURGERY    . SINUS ENDO W/FUSION Right 06/30/2013   Procedure: RIGHT ENDOSCOPIC SPHENOIDECTOMY WITH FUSION SCAN;  Surgeon: [name], MD;  Location: [facility];  Service: ENT;  Laterality: Right;  . TONSILLECTOMY    . VAGINAL HYSTERECTOMY      Allergies  Allergen Reactions  . Prednisone Anaphylaxis    Patient unable to remember why she needed to steroid shot but just knows that when she got back to work she started having a lot of trouble breathing.  (jkl 05/04/14)  . Aspirin Other (See  Comments)    Stomach ache and bleed  . Erythromycin Diarrhea  . Latex Other (See Comments)    blisters  . Levofloxacin Other (See Comments)    Headaches, GI upset  . Nsaids Other (See Comments)    stomach bleeding per pt  . Statins Nausea Only and Other (See Comments)    Headache, upset stomach, joint hurt, heartburn  . Sumatriptan Hives and Palpitations  . Adhesive [Tape] Other (See Comments)    blisters  . Amoxicillin-Pot Clavulanate   . Doxycycline   . Ivp Dye  [Iodinated Diagnostic Agents]     If made with blue shellfish then CAN NOT have this type of dye.   . Methylphenidate Hcl   . Mirtazapine   . Shellfish Allergy     Blue shellfish  . Hydrocodone Itching  . Hydrocodone-Acetaminophen Itching  . Rofecoxib Nausea Only  . Sertraline Hcl Nausea Only  . Sulfonamide Derivatives Itching    Immunization History  Administered Date(s) Administered  . Influenza Split 04/07/2011, 03/08/2012  . Influenza Whole 03/20/2008, 04/01/2009, 04/02/2010  . Influenza, High Dose Seasonal PF 04/12/2013, 03/26/2015  . Influenza,inj,Quad PF,36+ Mos 05/01/2014  . Pneumococcal Conjugate-13 05/02/2013  . Pneumococcal Polysaccharide-23 01/25/2006  . Td 09/17/2008  . Zoster 01/25/2006    Family History  Problem Relation Age of Onset  . Diabetes Mother   . Heart disease Father   . Diabetes Sister   . Breast cancer Maternal Grandmother   . Heart disease Maternal Uncle     x 2  . Diabetes Other     multi relatives on father side  . Heart disease Maternal Aunt   . Kidney disease Paternal Uncle     questionable  . Colon cancer Neg Hx   . Colon polyps Neg Hx   . Rectal cancer Neg Hx   . Stomach cancer Neg Hx      Current Outpatient Prescriptions:  .  ALPRAZolam (XANAX) 0.5 MG tablet, TAKE 1 TABLET BY MOUTH TWICE DAILY, Disp: 60 tablet, Rfl: 0 .  Amino Acids (AMINO ACID PO), Take 1 capsule by mouth 2 (two) times daily., Disp: , Rfl:  .  B Complex-Biotin-FA (B COMPLETE) TABS, Take 1 tablet by mouth daily. B Complete 100mg , Disp: , Rfl:  .  benzonatate (TESSALON) 200 MG capsule, TAKE 1 CAPSULE(200 MG) BY MOUTH THREE TIMES DAILY AS NEEDED FOR COUGH, Disp: 90 capsule, Rfl: 5 .  buPROPion (WELLBUTRIN XL) 300 MG 24 hr tablet, TAKE 1 TABLET (300 MG TOTAL) BY MOUTH DAILY., Disp: 90 tablet, Rfl: 3 .  calcium carbonate (OS-CAL) 600 MG TABS tablet, Take 600 mg by mouth 2 (two) times daily with a meal. , Disp: , Rfl:  .  cetirizine (ZYRTEC) 10 MG tablet, TAKE 1 TABLET  (10 MG TOTAL) BY MOUTH DAILY., Disp: 30 tablet, Rfl: 11 .  cholecalciferol (VITAMIN D) 1000 UNITS tablet, Take 1,000 Units by mouth daily., Disp: , Rfl:  .  clidinium-chlordiazePOXIDE (LIBRAX) 5-2.5 MG per capsule, Take 1 capsule by mouth daily as needed (for stomach)., Disp: 60 capsule, Rfl: 4 .  Coenzyme Q10 (CO Q-10) 200 MG CAPS, Take 1 capsule by mouth 2 (two) times daily., Disp: , Rfl:  .  esomeprazole (NEXIUM) 20 MG capsule, Take 20 mg by mouth daily at 12 noon., Disp: , Rfl:  .  estradiol (ESTRACE) 1 MG tablet, TAKE 1 TABLET BY MOUTH EVERY DAY, Disp: 90 tablet, Rfl: 0 .  FLUoxetine (PROZAC) 20 MG capsule, TAKE 1 CAPSULE BY MOUTH EVERY DAY,  Disp: 90 capsule, Rfl: 0 .  fluticasone (FLONASE) 50 MCG/ACT nasal spray, Place 2 sprays into both nostrils daily., Disp: 16 g, Rfl: 2 .  levothyroxine (SYNTHROID, LEVOTHROID) 25 MCG tablet, TAKE 1 TABLET (25 MCG TOTAL) BY MOUTH DAILY BEFORE BREAKFAST., Disp: 90 tablet, Rfl: 1 .  Meperidine HCl (DEMEROL PO), Take by mouth as needed. Takes for headaches unsure dose, Disp: , Rfl:  .  Multiple Vitamin (MULTIVITAMIN WITH MINERALS) TABS tablet, Take 1 tablet by mouth daily., Disp: , Rfl:  .  Omega-3 Fatty Acids (SALMON OIL-1000 PO), Take 1,000 mg by mouth daily., Disp: , Rfl:  .  ondansetron (ZOFRAN) 4 MG tablet, Take 1 tablet (4 mg total) by mouth every 8 (eight) hours as needed for nausea or vomiting., Disp: 40 tablet, Rfl: 1 .  Potassium 99 MG TABS, Take 99 mg by mouth daily., Disp: , Rfl:  .  triamcinolone cream (KENALOG) 0.5 %, Apply topically 2 (two) times daily. As needed for rash, Disp: 30 g, Rfl: 0 .  triamterene-hydrochlorothiazide (MAXZIDE-25) 37.5-25 MG tablet, TAKE 1 TABLET BY MOUTH EVERY DAY, Disp: 90 tablet, Rfl: 3 .  TURMERIC PO, Take by mouth., Disp: , Rfl:     Review of Systems     Objective:   Physical Exam  Vitals:   03/03/16 1424  BP: 138/88  Pulse: 91  SpO2: 97%  Weight: 144 lb (65.3 kg)  Height: 5' (1.524 m)    Estimated  body mass index is 28.12 kg/m as calculated from the following:   Height as of this encounter: 5' (1.524 m).   Weight as of this encounter: 144 lb (65.3 kg).      Assessment:       ICD-9-CM ICD-10-CM   1. Chronic cough 786.2 R05   2. 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 There is no evidence of asthma. This could be related to her fibromyalgia that she herself suspects. Another was at the pain sensitivity issue of the cough no fibers. However before making the diagnosis I would recommend a high-resolution CT chest and full pulmonary function test and regroup. If etiologies are ruled out I would recommend stopping acid reflux provoking agent such as turmeric and omega-3 fatty acid and starting gabapentin associated with voice rehabilitation   She can come back in the next few weeks after testing to see myself or Dr. Annamaria Boots our nurse practitioner  Patricia Nettle   Dr. Brand Males, M.D., F.C.C.P Pulmonary and Critical Care Medicine Staff Physician [facility] Pager: [phone], If no answer or between  15:00h - 7:00h: call [phone]  03/03/2016 3:13 PM

## 2016-03-03 NOTE — Addendum Note (Signed)
Addended by: Collier Salina on: 03/03/2016 05:30 PM   Modules accepted: Orders

## 2016-03-04 ENCOUNTER — Ambulatory Visit (INDEPENDENT_AMBULATORY_CARE_PROVIDER_SITE_OTHER): Payer: Medicare Other | Admitting: Internal Medicine

## 2016-03-04 ENCOUNTER — Encounter: Payer: Self-pay | Admitting: Internal Medicine

## 2016-03-04 VITALS — BP 142/84 | HR 90 | Temp 98.1°F | Resp 16 | Wt 144.0 lb

## 2016-03-04 DIAGNOSIS — G43809 Other migraine, not intractable, without status migrainosus: Secondary | ICD-10-CM

## 2016-03-04 DIAGNOSIS — R3912 Poor urinary stream: Secondary | ICD-10-CM | POA: Diagnosis not present

## 2016-03-04 DIAGNOSIS — K219 Gastro-esophageal reflux disease without esophagitis: Secondary | ICD-10-CM

## 2016-03-04 DIAGNOSIS — M797 Fibromyalgia: Secondary | ICD-10-CM | POA: Diagnosis not present

## 2016-03-04 DIAGNOSIS — Z23 Encounter for immunization: Secondary | ICD-10-CM | POA: Diagnosis not present

## 2016-03-04 DIAGNOSIS — F411 Generalized anxiety disorder: Secondary | ICD-10-CM

## 2016-03-04 DIAGNOSIS — R0689 Other abnormalities of breathing: Secondary | ICD-10-CM

## 2016-03-04 DIAGNOSIS — R110 Nausea: Secondary | ICD-10-CM | POA: Insufficient documentation

## 2016-03-04 DIAGNOSIS — R0600 Dyspnea, unspecified: Secondary | ICD-10-CM

## 2016-03-04 MED ORDER — ALPRAZOLAM 0.5 MG PO TABS: 0.5000 mg | ORAL_TABLET | Freq: Three times a day (TID) | ORAL | 0 refills | Status: DC | PRN

## 2016-03-04 MED ORDER — ONDANSETRON 8 MG PO TBDP: 8.0000 mg | ORAL_TABLET | Freq: Three times a day (TID) | ORAL | 3 refills | Status: DC | PRN

## 2016-03-04 NOTE — Assessment & Plan Note (Addendum)
Off Topamax Having occasional migraines Takes Demerol as needed Uses CBD oil as needed

## 2016-03-04 NOTE — Progress Notes (Signed)
Subjective:    Patient ID: Laura Barnes, female    DOB: 05-02-1941, 75 y.o.   MRN: [id number]  HPI She is here for an acute visit.   Urine:  She feels like she has to go to the bathroom bad when she has difficulty initiating her urine.  The urine initially dribbles, but often has a weak stream initially.  She does have history of bladder prolapse. She had a recent urine culture done and there was some bacteria, but it was a minimal amount. She is concerned about her symptoms because they are persisting.  Chronic cough, difficulty breathing at times/GERD:  She continues to have her chronic cough. She has had episodes of difficulty breathing. She saw pulmonary yesterday.  She will have a Ct of her chest and PFTs.  She does have GERD at times - she has it about 4 times a week.  She is currently taking nexium 20 mg twice a day.  She is treating her GERD with natural remedies.    In the last two weeks. One second she is fine and the next second she feels extreme nausea.  Her mouth will water.  It can last 30 min- one hour.  It does not occur daily.  It is not related to food or eating.  She knows this can occur with fibromyalgia.  She has tried zofran and it does not work-1 she has a pill she needs to swallow. She has tried her husband's zofran, which is a disintegrating pill and that has worked.  Sometimes ginger root works.    Migraines:  She recently stopped topamax per neurology.  She was having migraines daily.  She now is having occasion migraines.  She takes the demerol as needed for the migraines.    Anxiety:  She takes xanax twice a day.  She was taking it three days a day at one point and it was helping more.  She feels she needs more than twice a day and would like to increase the dose back up to three times a day.   Medications and allergies reviewed with patient and updated if appropriate.  Patient Active Problem List   Diagnosis Date Noted  . Dyspnea and respiratory  abnormality 03/03/2016  . Urinary hesitancy 02/28/2016  . Lightheadedness 12/18/2015  . Osteopenia 12/03/2015  . Thyroid nodule 11/16/2015  . Generalized abdominal pain 11/13/2015  . Diarrhea 11/13/2015  . Bilateral leg edema 11/05/2015  . Anterior neck pain 11/05/2015  . Postmenopausal 11/05/2015  . Subacromial bursitis 09/16/2015  . Umbilical pain [id number]  . Fluid retention in legs 05/07/2015  . 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 Trochanteric bursitis of both hips 03/12/2015  . Subacromial impingement of left shoulder 03/12/2015  . Breast implant removal status 05/01/2014  . Hyperlipemia 05/01/2014  . Chronic pain syndrome 05/01/2014  . Lumbar radiculopathy 03/27/2014  . Left shoulder pain 11/07/2013  . Degenerative cervical disc 10/10/2013  . Fibromyalgia 09/06/2013  . Bilateral shoulder pain 09/06/2013  . Urinary frequency 08/12/2013  . Low back pain 08/12/2013  . Chronic cough 04/12/2013  . Sinusitis, chronic 04/12/2013  . Hypothyroidism 04/07/2012  . Facial pain 10/01/2010  . VENOUS INSUFFICIENCY, CHRONIC 04/02/2010  . FOOT PAIN, BILATERAL 04/02/2010  . Enthesopathy of hip region 07/19/2007  . Anxiety state 01/23/2007  . Depression 01/23/2007  . Migraine 01/23/2007  . NINAR (noninfectious nonallergic rhinitis) 01/23/2007  . GERD 01/23/2007  . IBS 01/23/2007    Current Outpatient  Prescriptions on File Prior to Visit  Medication Sig Dispense Refill  . ALPRAZolam (XANAX) 0.5 MG tablet TAKE 1 TABLET BY MOUTH TWICE DAILY 60 tablet 0  . Amino Acids (AMINO ACID PO) Take 1 capsule by mouth 2 (two) times daily.    . B Complex-Biotin-FA (B COMPLETE) TABS Take 1 tablet by mouth daily. B Complete 100mg     . benzonatate (TESSALON) 200 MG capsule TAKE 1 CAPSULE(200 MG) BY MOUTH THREE TIMES DAILY AS NEEDED FOR COUGH 90 capsule 5  . buPROPion (WELLBUTRIN XL) 300 MG 24 hr tablet TAKE 1 TABLET (300 MG TOTAL) BY MOUTH DAILY. 90 tablet 3   . calcium carbonate (OS-CAL) 600 MG TABS tablet Take 600 mg by mouth 2 (two) times daily with a meal.     . cetirizine (ZYRTEC) 10 MG tablet TAKE 1 TABLET (10 MG TOTAL) BY MOUTH DAILY. 30 tablet 11  . cholecalciferol (VITAMIN D) 1000 UNITS tablet Take 1,000 Units by mouth daily.    . clidinium-chlordiazePOXIDE (LIBRAX) 5-2.5 MG per capsule Take 1 capsule by mouth daily as needed (for stomach). 60 capsule 4  . Coenzyme Q10 (CO Q-10) 200 MG CAPS Take 1 capsule by mouth 2 (two) times daily.    Marland Kitchen esomeprazole (NEXIUM) 20 MG capsule Take 20 mg by mouth daily at 12 noon.    Marland Kitchen estradiol (ESTRACE) 1 MG tablet TAKE 1 TABLET BY MOUTH EVERY DAY 90 tablet 0  . FLUoxetine (PROZAC) 20 MG capsule TAKE 1 CAPSULE BY MOUTH EVERY DAY 90 capsule 0  . fluticasone (FLONASE) 50 MCG/ACT nasal spray Place 2 sprays into both nostrils daily. 16 g 2  . levothyroxine (SYNTHROID, LEVOTHROID) 25 MCG tablet TAKE 1 TABLET (25 MCG TOTAL) BY MOUTH DAILY BEFORE BREAKFAST. 90 tablet 1  . Meperidine HCl (DEMEROL PO) Take by mouth as needed. Takes for headaches unsure dose    . Multiple Vitamin (MULTIVITAMIN WITH MINERALS) TABS tablet Take 1 tablet by mouth daily.    . Omega-3 Fatty Acids (SALMON OIL-1000 PO) Take 1,000 mg by mouth daily.    . ondansetron (ZOFRAN) 4 MG tablet Take 1 tablet (4 mg total) by mouth every 8 (eight) hours as needed for nausea or vomiting. 40 tablet 1  . Potassium 99 MG TABS Take 99 mg by mouth daily.    Marland Kitchen triamcinolone cream (KENALOG) 0.5 % Apply topically 2 (two) times daily. As needed for rash 30 g 0  . triamterene-hydrochlorothiazide (MAXZIDE-25) 37.5-25 MG tablet TAKE 1 TABLET BY MOUTH EVERY DAY 90 tablet 3  . TURMERIC PO Take by mouth.     No current facility-administered medications on file prior to visit.     Past Medical History:  Diagnosis Date  . ALLERGIC RHINITIS   . Anemia   . ANXIETY   . Bronchitis   . Complication of anesthesia    says one time waking up she couldn't breathe, felt  like her throat closing up  . DEPRESSION   . Enthesopathy of hip region   . Family history of anesthesia complication    sister with n/v  . FOOT PAIN, BILATERAL   . GERD   . Hiatal hernia   . HYPERLIPIDEMIA   . Hypothyroidism   . IBS   . MIGRAINE HEADACHE   . MIGRAINE, COMMON   . NECK MASS   . OSTEOPENIA   . OSTEOPOROSIS   . PLANTAR FASCIITIS   . RASH-NONVESICULAR   . SINUSITIS- ACUTE-NOS   . SKIN LESION   . VAGINITIS   .  VENOUS INSUFFICIENCY, CHRONIC     Past Surgical History:  Procedure Laterality Date  . APPENDECTOMY    . BREAST ENHANCEMENT SURGERY    . CHOLECYSTECTOMY    . COLONOSCOPY    . MASTECTOMY     bilateral for severe bilateral fibrocystic disease  . OOPHORECTOMY    . s/p neck lump removal    . SHOULDER SURGERY    . SINUS ENDO W/FUSION Right 06/30/2013   Procedure: RIGHT ENDOSCOPIC SPHENOIDECTOMY WITH FUSION SCAN;  Surgeon: [name], MD;  Location: [facility];  Service: ENT;  Laterality: Right;  . TONSILLECTOMY    . VAGINAL HYSTERECTOMY      Social History   Social History  . Marital status: Married    Spouse name: N/A  . Number of children: 2  . Years of education: N/A   Occupational History  . retired Press photographer    Social History Main Topics  . Smoking status: Never Smoker  . Smokeless tobacco: Never Used  . Alcohol use No  . Drug use: No  . Sexual activity: Not on file   Other Topics Concern  . Not on file   Social History Narrative   Has 2 biological children and 1 step child    Family History  Problem Relation Age of Onset  . Diabetes Mother   . Heart disease Father   . Diabetes Sister   . Breast cancer Maternal Grandmother   . Heart disease Maternal Uncle     x 2  . Diabetes Other     multi relatives on father side  . Heart disease Maternal Aunt   . Kidney disease Paternal Uncle     questionable  . Colon cancer Neg Hx   . Colon polyps Neg Hx   . Rectal cancer Neg Hx   . Stomach cancer Neg Hx     Review of Systems    Constitutional: Negative for fever.  HENT: Negative for trouble swallowing.   Respiratory: Positive for cough, choking and shortness of breath. Negative for wheezing.   Cardiovascular: Positive for leg swelling. Negative for chest pain.  Gastrointestinal: Positive for nausea. Negative for abdominal pain.       GERD frequently  Neurological: Positive for headaches. Negative for light-headedness.       Objective:   Vitals:   03/04/16 1008  BP: (!) 142/84  Pulse: 90  Resp: 16  Temp: 98.1 F (36.7 C)   Filed Weights   03/04/16 1008  Weight: 144 lb (65.3 kg)   Body mass index is 28.12 kg/m.   Physical Exam  Constitutional: She appears well-developed and well-nourished. No distress.  Pulmonary/Chest: Effort normal and breath sounds normal. No respiratory distress. She has no wheezes. She has no rales.  Abdominal: Soft. She exhibits no distension. There is no tenderness.  Musculoskeletal: She exhibits edema.  Skin: She is not diaphoretic.       Assessment & Plan:   See Problem List for Assessment and Plan of chronic medical problems.

## 2016-03-04 NOTE — Assessment & Plan Note (Signed)
GERD not ideally controlled She is taking Nexium 20 mg twice daily-she does not want to increase this Trying to control with natural supplements Has occasional nausea, which may be related to GERD

## 2016-03-04 NOTE — Progress Notes (Signed)
Pre visit review using our clinic review tool, if applicable. No additional management support is needed unless otherwise documented below in the visit note. 

## 2016-03-04 NOTE — Assessment & Plan Note (Signed)
Occasional, without obvious cause She feels this may be related to her fibromyalgia May also be related to her GERD Her Zofran is not working, we will try 8 mg disintegrating tablet-use only as needed

## 2016-03-04 NOTE — Assessment & Plan Note (Signed)
Increased anxiety Was taking Xanax 3 times daily in the past and would like to increase it back to 3 times daily. Sunday she only takes it twice daily, but often needs a third dose Agree to increase Xanax to 3 times daily as needed

## 2016-03-04 NOTE — Assessment & Plan Note (Signed)
Weak stream and difficulty initiating urination, history of  bladder prolapse Recent urine culture with minimal bacteria Concern for an anatomical cause for her urinary symptoms Will refer to urology for further evaluation

## 2016-03-04 NOTE — Assessment & Plan Note (Signed)
Uses CBD oil and  several natural supplements

## 2016-03-04 NOTE — Progress Notes (Signed)
[name] [place], [place] [address] Phone: [phone] Subjective:     CC: Chronic pain follow-up, leg issues  [id number]  Laura Barnes is a 74 y.o. female coming in for followup of chronic pain syndrome. Patient was having more upper back and neck pain previously.  Patient is now having more leg issues. Patient states Nothing seems to be hurting more. Discusses the pain as a dull, throbbing aching sensation. Patient states it hurts at night as well as with activity. States that it doesn't seem to go away. Denies any calf pain, any swelling other than her normal swelling. Patient states that it is affecting daily activities. Not responding to any of her chronic medications. No instability chest pain  Past Medical History:  Diagnosis Date  . ALLERGIC RHINITIS   . Anemia   . ANXIETY   . Bronchitis   . Complication of anesthesia    says one time waking up she couldn't breathe, felt like her throat closing up  . DEPRESSION   . Enthesopathy of hip region   . Family history of anesthesia complication    sister with n/v  . FOOT PAIN, BILATERAL   . GERD   . Hiatal hernia   . HYPERLIPIDEMIA   . Hypothyroidism   . IBS   . MIGRAINE HEADACHE   . MIGRAINE, COMMON   . NECK MASS   . OSTEOPENIA   . OSTEOPOROSIS   . PLANTAR FASCIITIS   . RASH-NONVESICULAR   . SINUSITIS- ACUTE-NOS   . SKIN LESION   . VAGINITIS   . VENOUS INSUFFICIENCY, CHRONIC    Past Surgical History:  Procedure Laterality Date  . APPENDECTOMY    . BREAST ENHANCEMENT SURGERY    . CHOLECYSTECTOMY    . COLONOSCOPY    . MASTECTOMY     bilateral for severe bilateral fibrocystic disease  . OOPHORECTOMY    . s/p neck lump removal    . SHOULDER SURGERY    . SINUS ENDO W/FUSION Right 06/30/2013   Procedure: RIGHT ENDOSCOPIC SPHENOIDECTOMY WITH FUSION SCAN;  Surgeon: [name], MD;  Location: [facility];  Service: ENT;  Laterality: Right;  . TONSILLECTOMY    .  VAGINAL HYSTERECTOMY    ' Social History  Substance Use Topics  . Smoking status: Never Smoker  . Smokeless tobacco: Never Used  . Alcohol use No   Family History  Problem Relation Age of Onset  . Diabetes Mother   . Heart disease Father   . Diabetes Sister   . Breast cancer Maternal Grandmother   . Heart disease Maternal Uncle     x 2  . Diabetes Other     multi relatives on father side  . Heart disease Maternal Aunt   . Kidney disease Paternal Uncle     questionable  . Colon cancer Neg Hx   . Colon polyps Neg Hx   . Rectal cancer Neg Hx   . Stomach cancer Neg Hx          Past medical history, social, surgical and family history all reviewed in electronic medical record.   Review of Systems: No headache, visual changes, nausea, vomiting, diarrhea, constipation, dizziness, abdominal pain, skin rash, fevers, chills, night sweats, weight loss, swollen lymph nodes, body aches, joint swelling, muscle aches, chest pain, shortness of breath, mood changes.   Objective  Blood pressure (!) 150/90, pulse 90, weight 144 lb 12.8 oz (65.7 kg).  General: No apparent distress alert  and oriented x3 mood and affect normal, dressed appropriately. Patient does have racing speech HEENT: Pupils equal, extraocular movements intact  Respiratory: Patient's speak in full sentences and does not appear short of breath  Cardiovascular: Trace lower extremity edema, non tender, no erythema  Skin: Warm dry intact with no signs of infection or rash on extremities or on axial skeleton.  Abdomen: Soft nontender  Neuro: Cranial nerves II through XII are intact, neurovascularly intact in all extremities with 2+ DTRs and 2+ pulses.  Lymph: No lymphadenopathy of posterior or anterior cervical chain or axillae bilaterally.  Gait normal with good balance and coordination.  MSK:  Non tender with full range of motion and good stability and symmetric strength and tone of shoulders, elbows, wrist, hip, knee and  ankles bilaterally. Multiple areas of discomfort with palpation still notedSignificant arthritic changes of multiple joints  Knee: Left Trace effusion noted Palpation normal with no warmth, joint line tenderness, patellar tenderness, or condyle tenderness. Lacks last 5 of forward flexion as well as flexion Ligaments with solid consistent endpoints including ACL, PCL, LCL, MCL. Positive Mcmurray's, Apley's, and Thessalonian tests. Non painful patellar compression. Patellar glide with mild crepitus. Patellar and quadriceps tendons unremarkable. Hamstring and quadriceps strength is normal.  Contralateral knee has mild crepitus but nontender  After informed written and verbal consent, patient was seated on exam table. Left knee was prepped with alcohol swab and utilizing anterolateral approach, patient's left knee space was injected with 4:1  marcaine 0.5%: Kenalog 40mg /dL. Patient tolerated the procedure well without immediate complications.       Impression and Recommendations:     This case required medical decision making of moderate complexity.

## 2016-03-04 NOTE — Assessment & Plan Note (Signed)
Has seen pulmonary CT scan of chest, PFTs

## 2016-03-04 NOTE — Patient Instructions (Addendum)
   All other Health Maintenance issues reviewed.   All recommended immunizations and age-appropriate screenings are up-to-date or discussed.  Flu vaccine administered today.   Medications reviewed and updated.  Changes include increasing the xanax to three times a day and changing zofran to 8 mg as needed for nausea.  Your prescription(s) have been submitted to your pharmacy. Please take as directed and contact our office if you believe you are having problem(s) with the medication(s).  A referral was ordered for urology.

## 2016-03-05 ENCOUNTER — Ambulatory Visit (INDEPENDENT_AMBULATORY_CARE_PROVIDER_SITE_OTHER): Payer: Medicare Other | Admitting: Family Medicine

## 2016-03-05 ENCOUNTER — Ambulatory Visit (INDEPENDENT_AMBULATORY_CARE_PROVIDER_SITE_OTHER)
Admission: RE | Admit: 2016-03-05 | Discharge: 2016-03-05 | Disposition: A | Discharge status: Home / Self Care | Payer: Medicare Other | Source: Ambulatory Visit | Attending: Family Medicine | Admitting: Family Medicine

## 2016-03-05 ENCOUNTER — Encounter: Payer: Self-pay | Admitting: Family Medicine

## 2016-03-05 VITALS — BP 150/90 | HR 90 | Wt 144.8 lb

## 2016-03-05 DIAGNOSIS — M23204 Derangement of unspecified medial meniscus due to old tear or injury, left knee: Secondary | ICD-10-CM

## 2016-03-05 DIAGNOSIS — G8929 Other chronic pain: Secondary | ICD-10-CM

## 2016-03-05 DIAGNOSIS — M25562 Pain in left knee: Secondary | ICD-10-CM | POA: Diagnosis not present

## 2016-03-05 DIAGNOSIS — M23209 Derangement of unspecified meniscus due to old tear or injury, unspecified knee: Secondary | ICD-10-CM | POA: Insufficient documentation

## 2016-03-05 NOTE — Patient Instructions (Signed)
Good to see you  Ice 20 minutes 2 times daily. Usually after activity and before bed. We will get xray today downstairs.  Injected the knee to calm it down .  pennsaid pinkie amount topically 2 times daily as needed.   See me again in 4 weeks to make sure it is much better Or if better send message in mychart.

## 2016-03-05 NOTE — Assessment & Plan Note (Signed)
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 Patient was given an injection. We discussed icing regimen and home exercises. We discussed which activities to do which was to avoid. We discussed when certain symptoms occur when to seek medical management. Trial of topical anti-inflammatories given. Patient will follow-up again in 4 weeks. Worsening symptoms consider formal physical therapy.

## 2016-03-09 ENCOUNTER — Ambulatory Visit (INDEPENDENT_AMBULATORY_CARE_PROVIDER_SITE_OTHER)
Admission: RE | Admit: 2016-03-09 | Discharge: 2016-03-09 | Disposition: A | Discharge status: Home / Self Care | Payer: Medicare Other | Source: Ambulatory Visit | Attending: Internal Medicine | Admitting: Internal Medicine

## 2016-03-09 DIAGNOSIS — R05 Cough: Secondary | ICD-10-CM | POA: Diagnosis not present

## 2016-03-09 DIAGNOSIS — R0600 Dyspnea, unspecified: Secondary | ICD-10-CM | POA: Diagnosis not present

## 2016-03-09 DIAGNOSIS — R0689 Other abnormalities of breathing: Secondary | ICD-10-CM

## 2016-03-09 DIAGNOSIS — R053 Chronic cough: Secondary | ICD-10-CM

## 2016-03-17 DIAGNOSIS — T8549XA Other mechanical complication of breast prosthesis and implant, initial encounter: Secondary | ICD-10-CM | POA: Diagnosis not present

## 2016-03-20 ENCOUNTER — Telehealth: Payer: Self-pay | Admitting: Internal Medicine

## 2016-03-20 DIAGNOSIS — F419 Anxiety disorder, unspecified: Secondary | ICD-10-CM

## 2016-03-20 MED ORDER — ALPRAZOLAM 0.5 MG PO TABS: 0.5000 mg | ORAL_TABLET | Freq: Three times a day (TID) | ORAL | 0 refills | Status: DC | PRN

## 2016-03-20 NOTE — Telephone Encounter (Signed)
RX faxed to [facility], Pt aware.   Pt states she does not want to start any new medications and does not want to do anything right now for the burning and itching.

## 2016-03-20 NOTE — Telephone Encounter (Signed)
Xanax rx printed.   The itching and burning could be many things - from medication or supplements or more serious problems - should be evaluated by myself or derm, ...Marland KitchenMarland Kitchen

## 2016-03-20 NOTE — Telephone Encounter (Signed)
Please advise 

## 2016-03-20 NOTE — Telephone Encounter (Signed)
Patient called in to request that we prescribe xanax, as she is out.  See note for increased dosage from last visit ------------------------------------ Increased anxiety Was taking Xanax 3 times daily in the past and would like to increase it back to 3 times daily. Sunday she only takes it twice daily, but often needs a third dose Agree to increase Xanax to 3 times daily as needed ------------------------------------  She also states that she is experiencing itching and burning all over her body.

## 2016-03-23 ENCOUNTER — Ambulatory Visit (HOSPITAL_COMMUNITY)
Admission: RE | Admit: 2016-03-23 | Discharge: 2016-03-23 | Disposition: A | Discharge status: Home / Self Care | Payer: Medicare Other | Source: Ambulatory Visit | Attending: Internal Medicine | Admitting: Internal Medicine

## 2016-03-23 ENCOUNTER — Encounter: Payer: Self-pay | Admitting: Internal Medicine

## 2016-03-23 ENCOUNTER — Ambulatory Visit (INDEPENDENT_AMBULATORY_CARE_PROVIDER_SITE_OTHER): Payer: Medicare Other | Admitting: Internal Medicine

## 2016-03-23 VITALS — BP 148/82 | HR 87 | Ht 60.0 in | Wt 147.0 lb

## 2016-03-23 DIAGNOSIS — R05 Cough: Secondary | ICD-10-CM

## 2016-03-23 DIAGNOSIS — R0689 Other abnormalities of breathing: Secondary | ICD-10-CM | POA: Insufficient documentation

## 2016-03-23 DIAGNOSIS — R0600 Dyspnea, unspecified: Secondary | ICD-10-CM | POA: Insufficient documentation

## 2016-03-23 DIAGNOSIS — R053 Chronic cough: Secondary | ICD-10-CM

## 2016-03-23 DIAGNOSIS — J387 Other diseases of larynx: Secondary | ICD-10-CM

## 2016-03-23 LAB — PULMONARY FUNCTION TEST
DL/VA % pred: 79 %
DL/VA: 3.35 ml/min/mmHg/L
DLCO unc % pred: 75 %
DLCO unc: 14.18 ml/min/mmHg
FEF 25-75 Post: 1.65 L/sec
FEF 25-75 Pre: 2.49 L/sec
FEF2575-%Change-Post: -33 %
FEF2575-%Pred-Post: 116 %
FEF2575-%Pred-Pre: 175 %
FEV1-%Change-Post: -7 %
FEV1-%Pred-Post: 135 %
FEV1-%Pred-Pre: 145 %
FEV1-Post: 2.34 L
FEV1-Pre: 2.52 L
FEV1FVC-%Change-Post: 5 %
FEV1FVC-%Pred-Pre: 105 %
FEV6-%Change-Post: -11 %
FEV6-%Pred-Post: 126 %
FEV6-%Pred-Pre: 142 %
FEV6-Post: 2.77 L
FEV6-Pre: 3.14 L
FEV6FVC-%Change-Post: 0 %
FEV6FVC-%Pred-Post: 105 %
FEV6FVC-%Pred-Pre: 105 %
FVC-%Change-Post: -12 %
FVC-%Pred-Post: 119 %
FVC-%Pred-Pre: 136 %
FVC-Post: 2.79 L
FVC-Pre: 3.18 L
Post FEV1/FVC ratio: 84 %
Post FEV6/FVC ratio: 100 %
Pre FEV1/FVC ratio: 79 %
Pre FEV6/FVC Ratio: 99 %
RV % pred: 59 %
RV: 1.25 L
TLC % pred: 102 %
TLC: 4.57 L

## 2016-03-23 MED ORDER — ALBUTEROL SULFATE (2.5 MG/3ML) 0.083% IN NEBU
2.5000 mg | INHALATION_SOLUTION | Freq: Once | RESPIRATORY_TRACT | Status: AC
  Administered 2016-03-23: 2.5 mg via RESPIRATORY_TRACT

## 2016-03-23 NOTE — Patient Instructions (Addendum)
ICD-9-CM ICD-10-CM   1. 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

## 2016-03-23 NOTE — Progress Notes (Signed)
Subjective:     Patient ID: Laura Barnes, female   DOB: [birth year], 75 y.o.   MRN: [id number]  HPI OV 03/03/2016  Chief Complaint  Patient presents with  . Acute Visit    Pt c/o increase in SOB, dry cough, throat irritation, 2 pillow orthopnea. Pt denies CP/tightness, chest congestion, f/c/s.   Acute visit for deterioration of chronic cough  75 year old female history gained from her and review of the old chart. According to the patient she has had chronic cough for 8 years which has no etiology. In early 2016 in mid 2016 she was seen by Dr. Annamaria Boots after having failed basic therapies with the primary care physician. Allergy testing was essentially negative except for mild elevation IgE against soy. She is not using  soy anyway and since then she's completely avoided it. However this did not help the cough. In between she is tried empiric long-acting anticholinergic which has not helped. For many years she's been on Tessalon cough Perles which helps take the edge off. Her cough is present day and night. It feels as though everything is closing in on her. The cough is associated with gag. She has chronic sinus issues for which Dr. Wilburn Cornelia has done surgery in the last few months . She also has ongoing acid reflux. She used to see Dr. Sharlett Iles  GI but is on PPI currentl but has frequent breakthrough. Medication review shows she is on turmeric and also omega-3 fatty acids all of which can provoke acid reflux. She's also on PPI. She's on antihistamine and nasal steroid.  Currently she reports cough has been worse for the last few to several weeks in the last few to shortness of breath that is more a chest tightness is also present. There is no associated fever of phlegm or edema or orthopnea paroxysmal nocturnal dyspnea. Her Exhaled nitric oxide today in the office  03/03/2016 - 16ppb and normal  She has ffibromyalgia and is wondring if cough is related   Review of the chart shows last chest  x-ray was April 2016 and clear. I personally visualized and done. She's never had CT chest.  Now had pulmonary function testing.    OV 03/23/2016  Chief Complaint  Patient presents with  . Follow-up    Pt here after HRCT and PFT. Pt states her SOB is unchanged. Pt states her cough is unchanged since last OV - dry cough. Pt denies CP/tightness and f/c/s.    Here for follow-up of chronic cough. She had pulmonary function test and CT scan reflected below. Cough is not any better. She says she is tried empiric inhalers in the past and does not want try them anymore. She does not want her to voice rehabilitation. She is allergic to gabapentin. She believes that fish oil and turmeric are not continuing to acid reflux.   Pulmonary function test 03/23/2016 is essentially normal except for mild reduction in diffusion capacity DLCO 14.2/75%.   IMPRESSION: 1. No evidence of interstitial lung disease. 2. Mild air trapping, indicative of mild small airways disease. 3. Aortic atherosclerosis, in addition to 2 vessel coronary artery disease. Assessment for potential risk factor modification, dietary therapy or pharmacologic therapy may be warranted, if clinically indicated. 4. Additional incidental findings, as above.   Electronically Signed   By: Vinnie Langton M.D.   On: 03/09/2016 13:44     has a past medical history of ALLERGIC RHINITIS; Anemia; ANXIETY; Bronchitis; Complication of anesthesia; DEPRESSION; Enthesopathy of hip region; Family history  of anesthesia complication; FOOT PAIN, BILATERAL; GERD; Hiatal hernia; HYPERLIPIDEMIA; Hypothyroidism; IBS; MIGRAINE HEADACHE; MIGRAINE, COMMON; NECK MASS; OSTEOPENIA; OSTEOPOROSIS; PLANTAR FASCIITIS; RASH-NONVESICULAR; SINUSITIS- ACUTE-NOS; SKIN LESION; VAGINITIS; and VENOUS INSUFFICIENCY, CHRONIC.   reports that she has never smoked. She has never used smokeless tobacco.  Past Surgical History:  Procedure Laterality Date  . APPENDECTOMY     . BREAST ENHANCEMENT SURGERY    . CHOLECYSTECTOMY    . COLONOSCOPY    . MASTECTOMY     bilateral for severe bilateral fibrocystic disease  . OOPHORECTOMY    . s/p neck lump removal    . SHOULDER SURGERY    . SINUS ENDO W/FUSION Right 06/30/2013   Procedure: RIGHT ENDOSCOPIC SPHENOIDECTOMY WITH FUSION SCAN;  Surgeon: [name], MD;  Location: [place];  Service: ENT;  Laterality: Right;  . TONSILLECTOMY    . VAGINAL HYSTERECTOMY      Allergies  Allergen Reactions  . Prednisone Anaphylaxis    Patient unable to remember why she needed to steroid shot but just knows that when she got back to work she started having a lot of trouble breathing.  (jkl 05/04/14)  . Aspirin Other (See Comments)    Stomach ache and bleed  . Erythromycin Diarrhea  . Latex Other (See Comments)    blisters  . Levofloxacin Other (See Comments)    Headaches, GI upset  . Nsaids Other (See Comments)    stomach bleeding per pt  . Statins Nausea Only and Other (See Comments)    Headache, upset stomach, joint hurt, heartburn  . Sumatriptan Hives and Palpitations  . Adhesive [Tape] Other (See Comments)    blisters  . Amoxicillin-Pot Clavulanate   . Doxycycline   . Ivp Dye [Iodinated Diagnostic Agents]     If made with blue shellfish then CAN NOT have this type of dye.   . Methylphenidate Hcl   . Mirtazapine   . Shellfish Allergy     Blue shellfish  . Hydrocodone Itching  . Hydrocodone-Acetaminophen Itching  . Rofecoxib Nausea Only  . Sertraline Hcl Nausea Only  . Sulfonamide Derivatives Itching    Immunization History  Administered Date(s) Administered  . Influenza Split 04/07/2011, 03/08/2012  . Influenza Whole 03/20/2008, 04/01/2009, 04/02/2010  . Influenza, High Dose Seasonal PF 04/12/2013, 03/26/2015, 03/04/2016  . Influenza,inj,Quad PF,36+ Mos 05/01/2014  . Pneumococcal Conjugate-13 05/02/2013  . Pneumococcal Polysaccharide-23 01/25/2006  . Td 09/17/2008  . Zoster 01/25/2006    Family  History  Problem Relation Age of Onset  . Diabetes Mother   . Heart disease Father   . Diabetes Sister   . Breast cancer Maternal Grandmother   . Heart disease Maternal Uncle     x 2  . Diabetes Other     multi relatives on father side  . Heart disease Maternal Aunt   . Kidney disease Paternal Uncle     questionable  . Colon cancer Neg Hx   . Colon polyps Neg Hx   . Rectal cancer Neg Hx   . Stomach cancer Neg Hx      Current Outpatient Prescriptions:  .  ALPRAZolam (XANAX) 0.5 MG tablet, Take 1 tablet (0.5 mg total) by mouth 3 (three) times daily as needed for anxiety., Disp: 90 tablet, Rfl: 0 .  Amino Acids (AMINO ACID PO), Take 1 capsule by mouth 2 (two) times daily., Disp: , Rfl:  .  B Complex-Biotin-FA (B COMPLETE) TABS, Take 1 tablet by mouth daily. B Complete 100mg , Disp: , Rfl:  .  benzonatate (TESSALON) 200 MG capsule, TAKE 1 CAPSULE(200 MG) BY MOUTH THREE TIMES DAILY AS NEEDED FOR COUGH, Disp: 90 capsule, Rfl: 5 .  buPROPion (WELLBUTRIN XL) 300 MG 24 hr tablet, TAKE 1 TABLET (300 MG TOTAL) BY MOUTH DAILY., Disp: 90 tablet, Rfl: 3 .  calcium carbonate (OS-CAL) 600 MG TABS tablet, Take 600 mg by mouth 2 (two) times daily with a meal. , Disp: , Rfl:  .  cetirizine (ZYRTEC) 10 MG tablet, TAKE 1 TABLET (10 MG TOTAL) BY MOUTH DAILY., Disp: 30 tablet, Rfl: 11 .  cholecalciferol (VITAMIN D) 1000 UNITS tablet, Take 1,000 Units by mouth daily., Disp: , Rfl:  .  clidinium-chlordiazePOXIDE (LIBRAX) 5-2.5 MG per capsule, Take 1 capsule by mouth daily as needed (for stomach)., Disp: 60 capsule, Rfl: 4 .  Coenzyme Q10 (CO Q-10) 200 MG CAPS, Take 1 capsule by mouth 2 (two) times daily., Disp: , Rfl:  .  esomeprazole (NEXIUM) 20 MG capsule, Take 20 mg by mouth daily at 12 noon., Disp: , Rfl:  .  estradiol (ESTRACE) 1 MG tablet, TAKE 1 TABLET BY MOUTH EVERY DAY, Disp: 90 tablet, Rfl: 0 .  FLUoxetine (PROZAC) 20 MG capsule, TAKE 1 CAPSULE BY MOUTH EVERY DAY, Disp: 90 capsule, Rfl: 0 .   fluticasone (FLONASE) 50 MCG/ACT nasal spray, Place 2 sprays into both nostrils daily., Disp: 16 g, Rfl: 2 .  levothyroxine (SYNTHROID, LEVOTHROID) 25 MCG tablet, TAKE 1 TABLET (25 MCG TOTAL) BY MOUTH DAILY BEFORE BREAKFAST., Disp: 90 tablet, Rfl: 1 .  Meperidine HCl (DEMEROL PO), Take by mouth as needed. Takes for headaches unsure dose, Disp: , Rfl:  .  Multiple Vitamin (MULTIVITAMIN WITH MINERALS) TABS tablet, Take 1 tablet by mouth daily., Disp: , Rfl:  .  Omega-3 Fatty Acids (SALMON OIL-1000 PO), Take 1,000 mg by mouth daily., Disp: , Rfl:  .  ondansetron (ZOFRAN-ODT) 8 MG disintegrating tablet, Take 1 tablet (8 mg total) by mouth every 8 (eight) hours as needed for nausea or vomiting., Disp: 20 tablet, Rfl: 3 .  Potassium 99 MG TABS, Take 99 mg by mouth daily., Disp: , Rfl:  .  triamcinolone cream (KENALOG) 0.5 %, Apply topically 2 (two) times daily. As needed for rash, Disp: 30 g, Rfl: 0 .  triamterene-hydrochlorothiazide (MAXZIDE-25) 37.5-25 MG tablet, TAKE 1 TABLET BY MOUTH EVERY DAY, Disp: 90 tablet, Rfl: 3 .  TURMERIC PO, Take by mouth., Disp: , Rfl:     Review of Systems     Objective:   Physical Exam Vitals:   03/23/16 1150  BP: (!) 148/82  Pulse: 87  SpO2: 99%  Weight: 147 lb (66.7 kg)  Height: 5' (1.524 m)   Discussion only visit     Assessment:       ICD-9-CM ICD-10-CM   1. 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 - We discussed stopping fish oil or turmeric and we agreed that this would not be good options for you - We discussed trying inhaler therapy as a trial but we discussed and agreed that this did not work for you in the past so we will not do at this time - We discussed trying empiric gabapentin but you have allergies to this so we will not do this - We discussed a trial of voice rehabilitation but agree that this is not  current best interest  Therefore we will just  follow along In the future visit research trial for chronic cough we can refer you to that  (> 50% of this 15 min visit spent in face to face counseling or/and coordination of care)   Dr. Brand Males, M.D., [facility].C.P Pulmonary and Critical Care Medicine Staff Physician [place] [facility] Pager: [phone], If no answer or between  15:00h - 7:00h: call [phone]  03/23/2016 12:15 PM

## 2016-04-16 ENCOUNTER — Telehealth: Payer: Self-pay | Admitting: Internal Medicine

## 2016-04-16 NOTE — Telephone Encounter (Signed)
Yes plase cancel the April 2018 pft  Dr. Brand Males, M.D., F.C.C.P Pulmonary and Critical Care Medicine Staff Physician [place] [facility] Pager: [phone], If no answer or between  15:00h - 7:00h: call [phone]  04/16/2016 11:26 PM

## 2016-04-16 NOTE — Telephone Encounter (Signed)
This looks like a scheduling error. Please advise if you would like me to cancel this. Thanks.

## 2016-04-16 NOTE — Telephone Encounter (Signed)
Hi Laura Barnes  Noticedt that she has PFT scheduled April 2018 and says 6 months per me. However, she already had her PFT in October and my most recent note says will follow along.   Please clarify  Thanks  Dr. Brand Males, M.D., [facility].C.P Pulmonary and Critical Care Medicine Staff Physician [place] [facility] Pager: [phone], If no answer or between  15:00h - 7:00h: call [phone]  04/16/2016 6:05 AM

## 2016-04-17 NOTE — Telephone Encounter (Signed)
Pt aware that PFT has been canceled. Pt voiced understanding & had no further question. Nothing further needed.

## 2016-05-07 DIAGNOSIS — T8543XA Leakage of breast prosthesis and implant, initial encounter: Secondary | ICD-10-CM | POA: Diagnosis not present

## 2016-05-07 DIAGNOSIS — N6310 Unspecified lump in the right breast, unspecified quadrant: Secondary | ICD-10-CM | POA: Diagnosis not present

## 2016-05-10 NOTE — Progress Notes (Signed)
Subjective:    Patient ID: Laura Barnes, female    DOB: September 05, 1940, 75 y.o.   MRN: [id number]  HPI She is here for a physical exam.     Lump on her hip:  It has hgrown in the past two weeks.  She noticed in the past two - three weeks.  It was initially tender, but that improved.  She wonders if it is a cyst, which she is prone to.  It concerned her that it grew since she noticed it.    She has had a lot of stress recnetly - her husband has been in and out of the hospital.  She feels her blood pressure is elevated today because of increased stress. She states it is usually not high.  She has had some memory issues for the past two weeks.  There has been increased stress and anxiety, but she overall feels her anxiety is controlled.   She has had a sore throat intermittently, hoarseness and rawness in her throat.  She is coughing. She also has a chronic cough.  She is taking tessalon perles.  It feels like there is some thing coating in her throat.  She does have PND intermittently. She denies frequent gerd.    She will be having her breast implants removed.  The right implant is leakng.  She will saw a surgeon already but will be seeing the surgeon at [facility] and he will be doing the surgery.   The muscles in her calves feel sore and tight.  It radiates up to her buttock.  She did not know if this was the Fibromyalgia or something different causing the pain. She was planning on following up with orthopedics or Dr. Tamala Julian.    Medications and allergies reviewed with patient and updated if appropriate.  Patient Active Problem List   Diagnosis Date Noted  . 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 Chronic meniscal tear of knee 03/05/2016  . Weak urinary stream 03/04/2016  . Nausea without vomiting 03/04/2016  . Dyspnea and respiratory abnormality 03/03/2016  . Urinary hesitancy 02/28/2016  . Lightheadedness 12/18/2015  . Osteopenia 12/03/2015  . Thyroid nodule 11/16/2015  .  Generalized abdominal pain 11/13/2015  . Diarrhea 11/13/2015  . Bilateral leg edema 11/05/2015  . Anterior neck pain 11/05/2015  . Postmenopausal 11/05/2015  . Subacromial bursitis 09/16/2015  . Umbilical pain [id number]  . Fluid retention in legs 05/07/2015  . 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 Trochanteric bursitis of both hips 03/12/2015  . Subacromial impingement of left shoulder 03/12/2015  . Hyperlipemia 05/01/2014  . Chronic pain syndrome 05/01/2014  . Lumbar radiculopathy 03/27/2014  . Left shoulder pain 11/07/2013  . Degenerative cervical disc 10/10/2013  . Fibromyalgia 09/06/2013  . Bilateral shoulder pain 09/06/2013  . Urinary frequency 08/12/2013  . Low back pain 08/12/2013  . Chronic cough 04/12/2013  . Sinusitis, chronic 04/12/2013  . Hypothyroidism 04/07/2012  . Facial pain 10/01/2010  . VENOUS INSUFFICIENCY, CHRONIC 04/02/2010  . FOOT PAIN, BILATERAL 04/02/2010  . Enthesopathy of hip region 07/19/2007  . Anxiety state 01/23/2007  . Depression 01/23/2007  . Migraine 01/23/2007  . NINAR (noninfectious nonallergic rhinitis) 01/23/2007  . GERD 01/23/2007  . IBS 01/23/2007    Current Outpatient Prescriptions on File Prior to Visit  Medication Sig Dispense Refill  . Amino Acids (AMINO ACID PO) Take 1 capsule by mouth 2 (two) times daily.    . B Complex-Biotin-FA (B COMPLETE) TABS Take 1  tablet by mouth daily. B Complete 100mg     . benzonatate (TESSALON) 200 MG capsule TAKE 1 CAPSULE(200 MG) BY MOUTH THREE TIMES DAILY AS NEEDED FOR COUGH 90 capsule 5  . buPROPion (WELLBUTRIN XL) 300 MG 24 hr tablet TAKE 1 TABLET (300 MG TOTAL) BY MOUTH DAILY. 90 tablet 3  . calcium carbonate (OS-CAL) 600 MG TABS tablet Take 600 mg by mouth 2 (two) times daily with a meal.     . cetirizine (ZYRTEC) 10 MG tablet TAKE 1 TABLET (10 MG TOTAL) BY MOUTH DAILY. 30 tablet 11  . cholecalciferol (VITAMIN D) 1000 UNITS tablet Take 1,000 Units by  mouth daily.    . clidinium-chlordiazePOXIDE (LIBRAX) 5-2.5 MG per capsule Take 1 capsule by mouth daily as needed (for stomach). 60 capsule 4  . Coenzyme Q10 (CO Q-10) 200 MG CAPS Take 1 capsule by mouth 2 (two) times daily.    Marland Kitchen esomeprazole (NEXIUM) 20 MG capsule Take 20 mg by mouth daily at 12 noon.    Marland Kitchen estradiol (ESTRACE) 1 MG tablet TAKE 1 TABLET BY MOUTH EVERY DAY 90 tablet 0  . FLUoxetine (PROZAC) 20 MG capsule TAKE 1 CAPSULE BY MOUTH EVERY DAY 90 capsule 0  . fluticasone (FLONASE) 50 MCG/ACT nasal spray Place 2 sprays into both nostrils daily. 16 g 2  . levothyroxine (SYNTHROID, LEVOTHROID) 25 MCG tablet TAKE 1 TABLET (25 MCG TOTAL) BY MOUTH DAILY BEFORE BREAKFAST. 90 tablet 1  . Meperidine HCl (DEMEROL PO) Take by mouth as needed. Takes for headaches unsure dose    . Multiple Vitamin (MULTIVITAMIN WITH MINERALS) TABS tablet Take 1 tablet by mouth daily.    . Omega-3 Fatty Acids (SALMON OIL-1000 PO) Take 1,000 mg by mouth daily.    . ondansetron (ZOFRAN-ODT) 8 MG disintegrating tablet Take 1 tablet (8 mg total) by mouth every 8 (eight) hours as needed for nausea or vomiting. 20 tablet 3  . Potassium 99 MG TABS Take 99 mg by mouth daily.    Marland Kitchen triamcinolone cream (KENALOG) 0.5 % Apply topically 2 (two) times daily. As needed for rash 30 g 0  . triamterene-hydrochlorothiazide (MAXZIDE-25) 37.5-25 MG tablet TAKE 1 TABLET BY MOUTH EVERY DAY 90 tablet 3  . TURMERIC PO Take by mouth.     No current facility-administered medications on file prior to visit.     Past Medical History:  Diagnosis Date  . ALLERGIC RHINITIS   . Anemia   . ANXIETY   . Bronchitis   . Complication of anesthesia    says one time waking up she couldn't breathe, felt like her throat closing up  . DEPRESSION   . Enthesopathy of hip region   . Family history of anesthesia complication    sister with n/v  . FOOT PAIN, BILATERAL   . GERD   . Hiatal hernia   . HYPERLIPIDEMIA   . Hypothyroidism   . IBS   .  MIGRAINE HEADACHE   . MIGRAINE, COMMON   . NECK MASS   . OSTEOPENIA   . OSTEOPOROSIS   . PLANTAR FASCIITIS   . RASH-NONVESICULAR   . SINUSITIS- ACUTE-NOS   . SKIN LESION   . VAGINITIS   . VENOUS INSUFFICIENCY, CHRONIC     Past Surgical History:  Procedure Laterality Date  . APPENDECTOMY    . BREAST ENHANCEMENT SURGERY    . CHOLECYSTECTOMY    . COLONOSCOPY    . MASTECTOMY     bilateral for severe bilateral fibrocystic disease  . OOPHORECTOMY    .  s/p neck lump removal    . SHOULDER SURGERY    . SINUS ENDO W/FUSION Right 06/30/2013   Procedure: RIGHT ENDOSCOPIC SPHENOIDECTOMY WITH FUSION SCAN;  Surgeon: [name], MD;  Location: [facility];  Service: ENT;  Laterality: Right;  . TONSILLECTOMY    . VAGINAL HYSTERECTOMY      Social History   Social History  . Marital status: Married    Spouse name: N/A  . Number of children: 2  . Years of education: N/A   Occupational History  . retired Press photographer    Social History Main Topics  . Smoking status: Never Smoker  . Smokeless tobacco: Never Used  . Alcohol use No  . Drug use: No  . Sexual activity: Not on file   Other Topics Concern  . Not on file   Social History Narrative   Has 2 biological children and 1 step child    Family History  Problem Relation Age of Onset  . Diabetes Mother   . Heart disease Father   . Diabetes Sister   . Breast cancer Maternal Grandmother   . Heart disease Maternal Uncle     x 2  . Diabetes Other     multi relatives on father side  . Heart disease Maternal Aunt   . Kidney disease Paternal Uncle     questionable  . Colon cancer Neg Hx   . Colon polyps Neg Hx   . Rectal cancer Neg Hx   . Stomach cancer Neg Hx     Review of Systems  Constitutional: Negative for chills and fever.  HENT: Positive for postnasal drip and sore throat.   Eyes: Negative for visual disturbance.  Respiratory: Positive for cough and shortness of breath (if walks up a hill). Negative for wheezing.     Cardiovascular: Positive for leg swelling (occ, none recently). Negative for chest pain and palpitations.  Gastrointestinal: Negative for abdominal pain, blood in stool, constipation, diarrhea and nausea.       GERD -none  Genitourinary: Negative for dysuria and hematuria.  Musculoskeletal: Positive for myalgias.  Skin: Negative for color change and rash.  Neurological: Negative for dizziness, light-headedness and headaches.  Psychiatric/Behavioral: Positive for dysphoric mood (controlled). The patient is nervous/anxious (controlled).        Objective:   Vitals:   05/11/16 1346  BP: (!) 154/92  Pulse: 98  Resp: 16  Temp: 98.2 F (36.8 C)   Filed Weights   05/11/16 1346  Weight: 144 lb (65.3 kg)   Body mass index is 28.12 kg/m.   Physical Exam Constitutional: She appears well-developed and well-nourished. No distress.  HENT:  Head: Normocephalic and atraumatic.  Right Ear: External ear normal. Normal ear canal and TM Left Ear: External ear normal.  Normal ear canal and TM Mouth/Throat: Oropharynx is clear and moist.  Eyes: Conjunctivae and EOM are normal.  Neck: Neck supple. No tracheal deviation present. No thyromegaly present.  No carotid bruit  Cardiovascular: Normal rate, regular rhythm and normal heart sounds.   No murmur heard.  No edema. Pulmonary/Chest: Effort normal and breath sounds normal. No respiratory distress. She has no wheezes. She has no rales.  Breast: deferred to Gyn Abdominal: Soft. She exhibits no distension. There is no tenderness. Musculoskeletal: Muscle tenderness diffuse  Lymphadenopathy: She has no cervical adenopathy.  Skin: Skin is warm and dry. She is not diaphoretic.  peanut M & M size lump in her right buttock that is not tender   Psychiatric: She has  a normal mood and affect. Her behavior is normal.         Assessment & Plan:   Physical exam: Screening blood work ordered Immunizations   Up to date  Colonoscopy  Up to date   Mammogram - does not qualify - s/p b/l mastectomy Gyn   - no longer seeing Dexa    Up to date  Eye exams  Up to date  EKG done in 2015 Exercise - not currently  Weight  Advised weight loss Skin  - lump - US done, concerns regarding wrinkles and white heads, see derm if needed Substance abuse - none  See Problem List for Assessment and Plan of chronic medical problems.

## 2016-05-11 ENCOUNTER — Ambulatory Visit (INDEPENDENT_AMBULATORY_CARE_PROVIDER_SITE_OTHER): Payer: Medicare Other | Admitting: Internal Medicine

## 2016-05-11 ENCOUNTER — Other Ambulatory Visit: Payer: Self-pay | Admitting: Internal Medicine

## 2016-05-11 ENCOUNTER — Encounter: Payer: Self-pay | Admitting: Internal Medicine

## 2016-05-11 VITALS — BP 154/92 | HR 98 | Temp 98.2°F | Resp 16 | Wt 144.0 lb

## 2016-05-11 DIAGNOSIS — Z0000 Encounter for general adult medical examination without abnormal findings: Secondary | ICD-10-CM | POA: Diagnosis not present

## 2016-05-11 DIAGNOSIS — F329 Major depressive disorder, single episode, unspecified: Secondary | ICD-10-CM

## 2016-05-11 DIAGNOSIS — E039 Hypothyroidism, unspecified: Secondary | ICD-10-CM

## 2016-05-11 DIAGNOSIS — E7800 Pure hypercholesterolemia, unspecified: Secondary | ICD-10-CM

## 2016-05-11 DIAGNOSIS — R229 Localized swelling, mass and lump, unspecified: Secondary | ICD-10-CM | POA: Insufficient documentation

## 2016-05-11 DIAGNOSIS — K219 Gastro-esophageal reflux disease without esophagitis: Secondary | ICD-10-CM | POA: Diagnosis not present

## 2016-05-11 DIAGNOSIS — F32A Depression, unspecified: Secondary | ICD-10-CM

## 2016-05-11 DIAGNOSIS — F419 Anxiety disorder, unspecified: Secondary | ICD-10-CM

## 2016-05-11 DIAGNOSIS — F411 Generalized anxiety disorder: Secondary | ICD-10-CM

## 2016-05-11 DIAGNOSIS — R600 Localized edema: Secondary | ICD-10-CM

## 2016-05-11 NOTE — Assessment & Plan Note (Signed)
Controlled, stable Continue current dose of medication  

## 2016-05-11 NOTE — Patient Instructions (Addendum)
Test(s) ordered today. Your results will be released to [place] (or called to you) after review, usually within 72hours after test completion. If any changes need to be made, you will be notified at that same time.  All other Health Maintenance issues reviewed.   All recommended immunizations and age-appropriate screenings are up-to-date or discussed.  No immunizations administered today.   Medications reviewed and updated.  No changes recommended at this time.  An ultrasound for your lump was ordered  Please followup in     Health Maintenance, Female Introduction Adopting a healthy lifestyle and getting preventive care can go a long way to promote health and wellness. Talk with your health care provider about what schedule of regular examinations is right for you. This is a good chance for you to check in with your provider about disease prevention and staying healthy. In between checkups, there are plenty of things you can do on your own. Experts have done a lot of research about which lifestyle changes and preventive measures are most likely to keep you healthy. Ask your health care provider for more information. Weight and diet Eat a healthy diet  Be sure to include plenty of vegetables, fruits, low-fat dairy products, and lean protein.  Do not eat a lot of foods high in solid fats, added sugars, or salt.  Get regular exercise. This is one of the most important things you can do for your health.  Most adults should exercise for at least 150 minutes each week. The exercise should increase your heart rate and make you sweat (moderate-intensity exercise).  Most adults should also do strengthening exercises at least twice a week. This is in addition to the moderate-intensity exercise. Maintain a healthy weight  Body mass index (BMI) is a measurement that can be used to identify possible weight problems. It estimates body fat based on height and weight. Your health care provider can  help determine your BMI and help you achieve or maintain a healthy weight.  For females 60 years of age and older:  A BMI below 18.5 is considered underweight.  A BMI of 18.5 to 24.9 is normal.  A BMI of 25 to 29.9 is considered overweight.  A BMI of 30 and above is considered obese. Watch levels of cholesterol and blood lipids  You should start having your blood tested for lipids and cholesterol at 75 years of age, then have this test every 5 years.  You may need to have your cholesterol levels checked more often if:  Your lipid or cholesterol levels are high.  You are older than 75 years of age.  You are at high risk for heart disease. Cancer screening Lung Cancer  Lung cancer screening is recommended for adults 55-61 years old who are at high risk for lung cancer because of a history of smoking.  A yearly low-dose CT scan of the lungs is recommended for people who:  Currently smoke.  Have quit within the past 15 years.  Have at least a 30-pack-year history of smoking. A pack year is smoking an average of one pack of cigarettes a day for 1 year.  Yearly screening should continue until it has been 15 years since you quit.  Yearly screening should stop if you develop a health problem that would prevent you from having lung cancer treatment. Breast Cancer  Practice breast self-awareness. This means understanding how your breasts normally appear and feel.  It also means doing regular breast self-exams. Let your health care provider  know about any changes, no matter how small.  If you are in your 20s or 30s, you should have a clinical breast exam (CBE) by a health care provider every 1-3 years as part of a regular health exam.  If you are 14 or older, have a CBE every year. Also consider having a breast X-ray (mammogram) every year.  If you have a family history of breast cancer, talk to your health care provider about genetic screening.  If you are at high risk for  breast cancer, talk to your health care provider about having an MRI and a mammogram every year.  Breast cancer gene (BRCA) assessment is recommended for women who have family members with BRCA-related cancers. BRCA-related cancers include:  Breast.  Ovarian.  Tubal.  Peritoneal cancers.  Results of the assessment will determine the need for genetic counseling and BRCA1 and BRCA2 testing. Cervical Cancer  Your health care provider may recommend that you be screened regularly for cancer of the pelvic organs (ovaries, uterus, and vagina). This screening involves a pelvic examination, including checking for microscopic changes to the surface of your cervix (Pap test). You may be encouraged to have this screening done every 3 years, beginning at age 35.  For women ages 43-65, health care providers may recommend pelvic exams and Pap testing every 3 years, or they may recommend the Pap and pelvic exam, combined with testing for human papilloma virus (HPV), every 5 years. Some types of HPV increase your risk of cervical cancer. Testing for HPV may also be done on women of any age with unclear Pap test results.  Other health care providers may not recommend any screening for nonpregnant women who are considered low risk for pelvic cancer and who do not have symptoms. Ask your health care provider if a screening pelvic exam is right for you.  If you have had past treatment for cervical cancer or a condition that could lead to cancer, you need Pap tests and screening for cancer for at least 20 years after your treatment. If Pap tests have been discontinued, your risk factors (such as having a new sexual partner) need to be reassessed to determine if screening should resume. Some women have medical problems that increase the chance of getting cervical cancer. In these cases, your health care provider may recommend more frequent screening and Pap tests. Colorectal Cancer  This type of cancer can be  detected and often prevented.  Routine colorectal cancer screening usually begins at 75 years of age and continues through 75 years of age.  Your health care provider may recommend screening at an earlier age if you have risk factors for colon cancer.  Your health care provider may also recommend using home test kits to check for hidden blood in the stool.  A small camera at the end of a tube can be used to examine your colon directly (sigmoidoscopy or colonoscopy). This is done to check for the earliest forms of colorectal cancer.  Routine screening usually begins at age 37.  Direct examination of the colon should be repeated every 5-10 years through 75 years of age. However, you may need to be screened more often if early forms of precancerous polyps or small growths are found. Skin Cancer  Check your skin from head to toe regularly.  Tell your health care provider about any new moles or changes in moles, especially if there is a change in a mole's shape or color.  Also tell your health care provider  if you have a mole that is larger than the size of a pencil eraser.  Always use sunscreen. Apply sunscreen liberally and repeatedly throughout the day.  Protect yourself by wearing long sleeves, pants, a wide-brimmed hat, and sunglasses whenever you are outside. Heart disease, diabetes, and high blood pressure  High blood pressure causes heart disease and increases the risk of stroke. High blood pressure is more likely to develop in:  People who have blood pressure in the high end of the normal range (130-139/85-89 mm Hg).  People who are overweight or obese.  People who are African American.  If you are 69-31 years of age, have your blood pressure checked every 3-5 years. If you are 74 years of age or older, have your blood pressure checked every year. You should have your blood pressure measured twice-once when you are at a hospital or clinic, and once when you are not at a hospital  or clinic. Record the average of the two measurements. To check your blood pressure when you are not at a hospital or clinic, you can use:  An automated blood pressure machine at a pharmacy.  A home blood pressure monitor.  If you are between 33 years and 61 years old, ask your health care provider if you should take aspirin to prevent strokes.  Have regular diabetes screenings. This involves taking a blood sample to check your fasting blood sugar level.  If you are at a normal weight and have a low risk for diabetes, have this test once every three years after 75 years of age.  If you are overweight and have a high risk for diabetes, consider being tested at a younger age or more often. Preventing infection Hepatitis B  If you have a higher risk for hepatitis B, you should be screened for this virus. You are considered at high risk for hepatitis B if:  You were born in a country where hepatitis B is common. Ask your health care provider which countries are considered high risk.  Your parents were born in a high-risk country, and you have not been immunized against hepatitis B (hepatitis B vaccine).  You have HIV or AIDS.  You use needles to inject street drugs.  You live with someone who has hepatitis B.  You have had sex with someone who has hepatitis B.  You get hemodialysis treatment.  You take certain medicines for conditions, including cancer, organ transplantation, and autoimmune conditions. Hepatitis C  Blood testing is recommended for:  Everyone born from [birth year] through 1965.  Anyone with known risk factors for hepatitis C. Sexually transmitted infections (STIs)  You should be screened for sexually transmitted infections (STIs) including gonorrhea and chlamydia if:  You are sexually active and are younger than 75 years of age.  You are older than 75 years of age and your health care provider tells you that you are at risk for this type of infection.  Your sexual  activity has changed since you were last screened and you are at an increased risk for chlamydia or gonorrhea. Ask your health care provider if you are at risk.  If you do not have HIV, but are at risk, it may be recommended that you take a prescription medicine daily to prevent HIV infection. This is called pre-exposure prophylaxis (PrEP). You are considered at risk if:  You are sexually active and do not regularly use condoms or know the HIV status of your partner(s).  You take drugs by injection.  You are sexually active with a partner who has HIV. Talk with your health care provider about whether you are at high risk of being infected with HIV. If you choose to begin PrEP, you should first be tested for HIV. You should then be tested every 3 months for as long as you are taking PrEP. Pregnancy  If you are premenopausal and you may become pregnant, ask your health care provider about preconception counseling.  If you may become pregnant, take 400 to 800 micrograms (mcg) of folic acid every day.  If you want to prevent pregnancy, talk to your health care provider about birth control (contraception). Osteoporosis and menopause  Osteoporosis is a disease in which the bones lose minerals and strength with aging. This can result in serious bone fractures. Your risk for osteoporosis can be identified using a bone density scan.  If you are 48 years of age or older, or if you are at risk for osteoporosis and fractures, ask your health care provider if you should be screened.  Ask your health care provider whether you should take a calcium or vitamin D supplement to lower your risk for osteoporosis.  Menopause may have certain physical symptoms and risks.  Hormone replacement therapy may reduce some of these symptoms and risks. Talk to your health care provider about whether hormone replacement therapy is right for you. Follow these instructions at home:  Schedule regular health, dental, and  eye exams.  Stay current with your immunizations.  Do not use any tobacco products including cigarettes, chewing tobacco, or electronic cigarettes.  If you are pregnant, do not drink alcohol.  If you are breastfeeding, limit how much and how often you drink alcohol.  Limit alcohol intake to no more than 1 drink per day for nonpregnant women. One drink equals 12 ounces of beer, 5 ounces of wine, or 1 ounces of hard liquor.  Do not use street drugs.  Do not share needles.  Ask your health care provider for help if you need support or information about quitting drugs.  Tell your health care provider if you often feel depressed.  Tell your health care provider if you have ever been abused or do not feel safe at home. This information is not intended to replace advice given to you by your health care provider. Make sure you discuss any questions you have with your health care provider. Document Released: 12/01/2010 Document Revised: 10/24/2015 Document Reviewed: 02/19/2015  2017 [employer]vier

## 2016-05-11 NOTE — Assessment & Plan Note (Signed)
Check tsh  Titrate med dose if needed  

## 2016-05-11 NOTE — Progress Notes (Signed)
Pre visit review using our clinic review tool, if applicable. No additional management support is needed unless otherwise documented below in the visit note. 

## 2016-05-11 NOTE — Assessment & Plan Note (Addendum)
Check lipid panel  Regular exercise and healthy diet encouraged  

## 2016-05-11 NOTE — Assessment & Plan Note (Signed)
Taking water pill daily for swelling, not htn continue

## 2016-05-11 NOTE — Telephone Encounter (Signed)
RX axed to POF

## 2016-05-11 NOTE — Assessment & Plan Note (Signed)
GERD controlled Continue daily medication  

## 2016-05-11 NOTE — Assessment & Plan Note (Signed)
Will check US

## 2016-05-18 ENCOUNTER — Other Ambulatory Visit: Payer: Medicare Other

## 2016-05-22 DIAGNOSIS — N63 Unspecified lump in unspecified breast: Secondary | ICD-10-CM | POA: Diagnosis not present

## 2016-05-22 DIAGNOSIS — T8549XA Other mechanical complication of breast prosthesis and implant, initial encounter: Secondary | ICD-10-CM | POA: Diagnosis not present

## 2016-05-27 NOTE — Telephone Encounter (Signed)
error 

## 2016-06-01 ENCOUNTER — Telehealth: Payer: Self-pay | Admitting: Cardiology

## 2016-06-01 NOTE — Telephone Encounter (Signed)
  Wrong  Pt  Lonney Revak PA-C 06/01/2016 4:53 PM

## 2016-06-11 ENCOUNTER — Other Ambulatory Visit: Payer: Medicare Other

## 2016-06-15 ENCOUNTER — Ambulatory Visit
Admission: RE | Admit: 2016-06-15 | Discharge: 2016-06-15 | Disposition: A | Discharge status: Home / Self Care | Payer: Medicare Other | Source: Ambulatory Visit | Attending: Internal Medicine | Admitting: Internal Medicine

## 2016-06-15 ENCOUNTER — Encounter: Payer: Self-pay | Admitting: Internal Medicine

## 2016-06-15 DIAGNOSIS — R1900 Intra-abdominal and pelvic swelling, mass and lump, unspecified site: Secondary | ICD-10-CM | POA: Diagnosis not present

## 2016-06-15 DIAGNOSIS — R229 Localized swelling, mass and lump, unspecified: Secondary | ICD-10-CM

## 2016-06-22 ENCOUNTER — Other Ambulatory Visit: Payer: Self-pay | Admitting: Internal Medicine

## 2016-06-22 DIAGNOSIS — F419 Anxiety disorder, unspecified: Secondary | ICD-10-CM

## 2016-06-23 NOTE — Telephone Encounter (Signed)
RX faxed to [facility] 

## 2016-07-08 DIAGNOSIS — T8543XA Leakage of breast prosthesis and implant, initial encounter: Secondary | ICD-10-CM | POA: Diagnosis not present

## 2016-07-24 ENCOUNTER — Other Ambulatory Visit: Payer: Self-pay | Admitting: Internal Medicine

## 2016-07-24 DIAGNOSIS — F419 Anxiety disorder, unspecified: Secondary | ICD-10-CM

## 2016-07-27 NOTE — Telephone Encounter (Signed)
RX faxed to [facility] 

## 2016-07-29 DIAGNOSIS — G43909 Migraine, unspecified, not intractable, without status migrainosus: Secondary | ICD-10-CM | POA: Diagnosis not present

## 2016-07-29 DIAGNOSIS — T8549XA Other mechanical complication of breast prosthesis and implant, initial encounter: Secondary | ICD-10-CM | POA: Diagnosis not present

## 2016-07-29 DIAGNOSIS — K589 Irritable bowel syndrome without diarrhea: Secondary | ICD-10-CM | POA: Diagnosis not present

## 2016-07-29 DIAGNOSIS — Z882 Allergy status to sulfonamides status: Secondary | ICD-10-CM | POA: Diagnosis not present

## 2016-07-29 DIAGNOSIS — Z888 Allergy status to other drugs, medicaments and biological substances status: Secondary | ICD-10-CM | POA: Diagnosis not present

## 2016-07-29 DIAGNOSIS — Z9189 Other specified personal risk factors, not elsewhere classified: Secondary | ICD-10-CM | POA: Diagnosis not present

## 2016-07-29 DIAGNOSIS — Z91013 Allergy to seafood: Secondary | ICD-10-CM | POA: Diagnosis not present

## 2016-07-29 DIAGNOSIS — Z886 Allergy status to analgesic agent status: Secondary | ICD-10-CM | POA: Diagnosis not present

## 2016-07-29 DIAGNOSIS — Z79899 Other long term (current) drug therapy: Secondary | ICD-10-CM | POA: Diagnosis not present

## 2016-07-29 DIAGNOSIS — Z881 Allergy status to other antibiotic agents status: Secondary | ICD-10-CM | POA: Diagnosis not present

## 2016-07-29 DIAGNOSIS — I872 Venous insufficiency (chronic) (peripheral): Secondary | ICD-10-CM | POA: Diagnosis not present

## 2016-07-29 DIAGNOSIS — M810 Age-related osteoporosis without current pathological fracture: Secondary | ICD-10-CM | POA: Diagnosis not present

## 2016-07-29 DIAGNOSIS — E039 Hypothyroidism, unspecified: Secondary | ICD-10-CM | POA: Diagnosis not present

## 2016-07-29 DIAGNOSIS — Z91048 Other nonmedicinal substance allergy status: Secondary | ICD-10-CM | POA: Diagnosis not present

## 2016-07-29 DIAGNOSIS — N951 Menopausal and female climacteric states: Secondary | ICD-10-CM | POA: Diagnosis not present

## 2016-07-29 DIAGNOSIS — Z79890 Hormone replacement therapy (postmenopausal): Secondary | ICD-10-CM | POA: Diagnosis not present

## 2016-07-29 DIAGNOSIS — Z7951 Long term (current) use of inhaled steroids: Secondary | ICD-10-CM | POA: Diagnosis not present

## 2016-07-29 DIAGNOSIS — E785 Hyperlipidemia, unspecified: Secondary | ICD-10-CM | POA: Diagnosis not present

## 2016-07-29 DIAGNOSIS — Z885 Allergy status to narcotic agent status: Secondary | ICD-10-CM | POA: Diagnosis not present

## 2016-07-29 DIAGNOSIS — K219 Gastro-esophageal reflux disease without esophagitis: Secondary | ICD-10-CM | POA: Diagnosis not present

## 2016-07-29 DIAGNOSIS — M797 Fibromyalgia: Secondary | ICD-10-CM | POA: Diagnosis not present

## 2016-07-29 DIAGNOSIS — Z880 Allergy status to penicillin: Secondary | ICD-10-CM | POA: Diagnosis not present

## 2016-07-29 DIAGNOSIS — L720 Epidermal cyst: Secondary | ICD-10-CM | POA: Diagnosis not present

## 2016-08-07 ENCOUNTER — Other Ambulatory Visit: Payer: Self-pay | Admitting: Internal Medicine

## 2016-08-07 ENCOUNTER — Emergency Department (HOSPITAL_COMMUNITY)
Admission: EM | Admit: 2016-08-07 | Discharge: 2016-08-07 | Disposition: A | Discharge status: Home / Self Care | Payer: Medicare Other | Attending: Emergency Medicine | Admitting: Emergency Medicine

## 2016-08-07 ENCOUNTER — Encounter (HOSPITAL_COMMUNITY): Payer: Self-pay | Admitting: Emergency Medicine

## 2016-08-07 DIAGNOSIS — W268XXA Contact with other sharp object(s), not elsewhere classified, initial encounter: Secondary | ICD-10-CM | POA: Diagnosis not present

## 2016-08-07 DIAGNOSIS — Y939 Activity, unspecified: Secondary | ICD-10-CM | POA: Diagnosis not present

## 2016-08-07 DIAGNOSIS — S61412A Laceration without foreign body of left hand, initial encounter: Secondary | ICD-10-CM | POA: Diagnosis not present

## 2016-08-07 DIAGNOSIS — E039 Hypothyroidism, unspecified: Secondary | ICD-10-CM | POA: Insufficient documentation

## 2016-08-07 DIAGNOSIS — Y999 Unspecified external cause status: Secondary | ICD-10-CM | POA: Insufficient documentation

## 2016-08-07 DIAGNOSIS — Z79899 Other long term (current) drug therapy: Secondary | ICD-10-CM | POA: Diagnosis not present

## 2016-08-07 DIAGNOSIS — Y92007 Garden or yard of unspecified non-institutional (private) residence as the place of occurrence of the external cause: Secondary | ICD-10-CM | POA: Insufficient documentation

## 2016-08-07 DIAGNOSIS — Z91040 Latex allergy status: Secondary | ICD-10-CM | POA: Diagnosis not present

## 2016-08-07 MED ORDER — LIDOCAINE HCL 2 % IJ SOLN
5.0000 mL | Freq: Once | INTRAMUSCULAR | Status: AC
  Administered 2016-08-07: 100 mg
  Filled 2016-08-07: qty 20

## 2016-08-07 NOTE — ED Provider Notes (Signed)
[facility] DEPT Provider Note   CSN: [id number] Arrival date & time: 08/07/16  1343  By signing my name below, I, Neta Mends, attest that this documentation has been prepared under the direction and in the presence of Evalee Jefferson, PA-C. Electronically Signed: Neta Mends, ED Scribe. 08/07/2016. 2:28 PM.    History   Chief Complaint Chief Complaint  Patient presents with  . Extremity Laceration    Left    The history is provided by the patient. No language interpreter was used.   HPI Comments: Laura Barnes is a 76 y.o. female who presents to the Emergency Department complaining of a wound sustained to the left hand that occurred PTA. Pt reports that she was moving a yard ornament and cut her left hand on a sharp edge of the object. Pr reports multiple allergies to medications, not including lidocaine. Bleeding is controlled with pressure dressing. Pt is right hand dominant. Pt denies numbness, weakness in the extremity or any other complaint.   Her tetanus is current.  PCP: Binnie Rail, MD   Past Medical History:  Diagnosis Date  . ALLERGIC RHINITIS   . Anemia   . ANXIETY   . Bronchitis   . Complication of anesthesia    says one time waking up she couldn't breathe, felt like her throat closing up  . DEPRESSION   . Enthesopathy of hip region   . Family history of anesthesia complication    sister with n/v  . FOOT PAIN, BILATERAL   . GERD   . Hiatal hernia   . HYPERLIPIDEMIA   . Hypothyroidism   . IBS   . MIGRAINE HEADACHE   . MIGRAINE, COMMON   . NECK MASS   . OSTEOPENIA   . OSTEOPOROSIS   . PLANTAR FASCIITIS   . RASH-NONVESICULAR   . SINUSITIS- ACUTE-NOS   . SKIN LESION   . VAGINITIS   . VENOUS INSUFFICIENCY, CHRONIC     Patient Active Problem List   Diagnosis Date Noted  . Lump of skin 05/11/2016  . 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 Chronic meniscal tear of knee 03/05/2016  . Weak urinary stream 03/04/2016  . Nausea without vomiting  03/04/2016  . Dyspnea and respiratory abnormality 03/03/2016  . Urinary hesitancy 02/28/2016  . Lightheadedness 12/18/2015  . Osteopenia 12/03/2015  . Thyroid nodule 11/16/2015  . Bilateral leg edema 11/05/2015  . Anterior neck pain 11/05/2015  . Postmenopausal 11/05/2015  . Subacromial bursitis 09/16/2015  . Umbilical pain 26/71/2458  . 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 Trochanteric bursitis of both hips 03/12/2015  . Subacromial impingement of left shoulder 03/12/2015  . Hyperlipemia 05/01/2014  . Chronic pain syndrome 05/01/2014  . Lumbar radiculopathy 03/27/2014  . Left shoulder pain 11/07/2013  . Degenerative cervical disc 10/10/2013  . Fibromyalgia 09/06/2013  . Bilateral shoulder pain 09/06/2013  . Urinary frequency 08/12/2013  . Low back pain 08/12/2013  . Chronic cough 04/12/2013  . Sinusitis, chronic 04/12/2013  . Hypothyroidism 04/07/2012  . Facial pain 10/01/2010  . VENOUS INSUFFICIENCY, CHRONIC 04/02/2010  . FOOT PAIN, BILATERAL 04/02/2010  . Enthesopathy of hip region 07/19/2007  . Anxiety state 01/23/2007  . Depression 01/23/2007  . Migraine 01/23/2007  . NINAR (noninfectious nonallergic rhinitis) 01/23/2007  . GERD 01/23/2007  . IBS 01/23/2007    Past Surgical History:  Procedure Laterality Date  . APPENDECTOMY    . BREAST ENHANCEMENT SURGERY    . CHOLECYSTECTOMY    . COLONOSCOPY    .  MASTECTOMY     bilateral for severe bilateral fibrocystic disease  . OOPHORECTOMY    . s/p neck lump removal    . SHOULDER SURGERY    . SINUS ENDO W/FUSION Right 06/30/2013   Procedure: RIGHT ENDOSCOPIC SPHENOIDECTOMY WITH FUSION SCAN;  Surgeon: [name], MD;  Location: [facility];  Service: ENT;  Laterality: Right;  . TONSILLECTOMY    . VAGINAL HYSTERECTOMY      OB History    No data available       Home Medications    Prior to Admission medications   Medication Sig Start Date End Date Taking? Authorizing  Provider  ALPRAZolam (XANAX) 0.5 MG tablet TAKE 1 TABLET BY MOUTH THREE TIMES DAILY AS NEEDED FOR ANXIETY 07/27/16   Binnie Rail, MD  Amino Acids (AMINO ACID PO) Take 1 capsule by mouth 2 (two) times daily.    Historical Provider, MD  B Complex-Biotin-FA (B COMPLETE) TABS Take 1 tablet by mouth daily. B Complete 100mg     Historical Provider, MD  benzonatate (TESSALON) 200 MG capsule TAKE 1 CAPSULE(200 MG) BY MOUTH THREE TIMES DAILY AS NEEDED FOR COUGH 10/07/15   Deneise Lever, MD  buPROPion (WELLBUTRIN XL) 300 MG 24 hr tablet TAKE 1 TABLET (300 MG TOTAL) BY MOUTH DAILY. 08/19/15   Binnie Rail, MD  calcium carbonate (OS-CAL) 600 MG TABS tablet Take 600 mg by mouth 2 (two) times daily with a meal.     Historical Provider, MD  cetirizine (ZYRTEC) 10 MG tablet TAKE 1 TABLET (10 MG TOTAL) BY MOUTH DAILY.    Biagio Borg, MD  cholecalciferol (VITAMIN D) 1000 UNITS tablet Take 1,000 Units by mouth daily.    Historical Provider, MD  clidinium-chlordiazePOXIDE (LIBRAX) 5-2.5 MG per capsule Take 1 capsule by mouth daily as needed (for stomach). 09/19/14   Biagio Borg, MD  Coenzyme Q10 (CO Q-10) 200 MG CAPS Take 1 capsule by mouth 2 (two) times daily.    Historical Provider, MD  esomeprazole (NEXIUM) 20 MG capsule Take 20 mg by mouth daily at 12 noon.    Historical Provider, MD  estradiol (ESTRACE) 1 MG tablet TAKE 1 TABLET BY MOUTH EVERY DAY 01/28/16   Biagio Borg, MD  FLUoxetine (PROZAC) 20 MG capsule TAKE 1 CAPSULE BY MOUTH EVERY DAY 02/13/16   Binnie Rail, MD  fluticasone (FLONASE) 50 MCG/ACT nasal spray Place 2 sprays into both nostrils daily. 05/22/14   Biagio Borg, MD  levothyroxine (SYNTHROID, LEVOTHROID) 25 MCG tablet TAKE 1 TABLET (25 MCG TOTAL) BY MOUTH DAILY BEFORE BREAKFAST. 11/13/15   Binnie Rail, MD  Meperidine HCl (DEMEROL PO) Take by mouth as needed. Takes for headaches unsure dose    Historical Provider, MD  Multiple Vitamin (MULTIVITAMIN WITH MINERALS) TABS tablet Take 1 tablet by mouth  daily.    Historical Provider, MD  Omega-3 Fatty Acids (SALMON OIL-1000 PO) Take 1,000 mg by mouth daily.    Historical Provider, MD  ondansetron (ZOFRAN-ODT) 8 MG disintegrating tablet Take 1 tablet (8 mg total) by mouth every 8 (eight) hours as needed for nausea or vomiting. 03/04/16   Binnie Rail, MD  Potassium 99 MG TABS Take 99 mg by mouth daily.    Historical Provider, MD  triamcinolone cream (KENALOG) 0.5 % Apply topically 2 (two) times daily. As needed for rash 04/07/12   Biagio Borg, MD  triamterene-hydrochlorothiazide (MAXZIDE-25) 37.5-25 MG tablet TAKE 1 TABLET BY MOUTH EVERY DAY 11/11/15   Marzetta Board  Lorretta Harp, MD  TURMERIC PO Take by mouth.    Historical Provider, MD    Family History Family History  Problem Relation Age of Onset  . Diabetes Mother   . Heart disease Father   . Diabetes Sister   . Breast cancer Maternal Grandmother   . Heart disease Maternal Uncle     x 2  . Diabetes Other     multi relatives on father side  . Heart disease Maternal Aunt   . Kidney disease Paternal Uncle     questionable  . Colon cancer Neg Hx   . Colon polyps Neg Hx   . Rectal cancer Neg Hx   . Stomach cancer Neg Hx     Social History Social History  Substance Use Topics  . Smoking status: Never Smoker  . Smokeless tobacco: Never Used  . Alcohol use No     Allergies   Prednisone; Aspirin; Erythromycin; Latex; Levofloxacin; Nsaids; Statins; Sumatriptan; Adhesive [tape]; Amoxicillin-pot clavulanate; Doxycycline; Ivp dye [iodinated diagnostic agents]; Methylphenidate hcl; Mirtazapine; Shellfish allergy; Hydrocodone; Hydrocodone-acetaminophen; Rofecoxib; Sertraline hcl; and Sulfonamide derivatives   Review of Systems Review of Systems  Constitutional: Negative.   Gastrointestinal: Negative for nausea.  Musculoskeletal: Negative.   Skin: Positive for wound.  Neurological: Negative for numbness.  All other systems reviewed and are negative.    Physical Exam Updated Vital  Signs BP 151/87 (BP Location: Right Arm)   Pulse 79   Temp 98.3 F (36.8 C) (Oral)   Resp 18   Ht 5' (1.524 m)   Wt 63.5 kg   SpO2 97%   BMI 27.34 kg/m   Physical Exam  Constitutional: She appears well-developed and well-nourished. No distress.  HENT:  Head: Normocephalic and atraumatic.  Eyes: Conjunctivae are normal.  Cardiovascular: Normal rate.   Pulmonary/Chest: Effort normal.  Abdominal: She exhibits no distension.  Neurological: She is alert.  Skin: Skin is warm and dry.  0.5cm laceration to left volar hand at base of index finger. Distal sensation intact. Hemostatic.   Psychiatric: She has a normal mood and affect.  Nursing note and vitals reviewed.    ED Treatments / Results  DIAGNOSTIC STUDIES:  Oxygen Saturation is 98% on RA, normal by my interpretation.    COORDINATION OF CARE:  2:26 PM Will suture wound. Discussed treatment plan with pt at bedside and pt agreed to plan.   Labs (all labs ordered are listed, but only abnormal results are displayed) Labs Reviewed - No data to display  EKG  EKG Interpretation None       Radiology No results found.  Procedures Procedures (including critical care time)  LACERATION REPAIR Performed by: Evalee Jefferson Authorized by: Evalee Jefferson Consent: Verbal consent obtained. Risks and benefits: risks, benefits and alternatives were discussed Consent given by: patient Patient identity confirmed: provided demographic data Prepped and Draped in normal sterile fashion Wound explored  Laceration Location: left volar hand  Laceration Length: 0.5 cm  No Foreign Bodies seen or palpated  Anesthesia: local infiltration  Local anesthetic: lidocaine 2% without epinephrine  Anesthetic total: 2 ml  Irrigation method: syringe Amount of cleaning: standard  Skin closure: ethilon 4-0  Number of sutures: 3  Technique: simple interupted  Patient tolerance: Patient tolerated the procedure well with no immediate  complications.   Medications Ordered in ED Medications  lidocaine (XYLOCAINE) 2 % (with pres) injection 100 mg (100 mg Other Given 08/07/16 1437)     Initial Impression / Assessment and Plan / ED Course  I have reviewed the triage vital signs and the nursing notes.  Pertinent labs & imaging results that were available during my care of the patient were reviewed by me and considered in my medical decision making (see chart for details).     Wound care instructions given.  Pt advised to have sutures removed in 10 days,  Return here sooner for any signs of infection including redness, swelling, worse pain or drainage of pus.     Final Clinical Impressions(s) / ED Diagnoses   Final diagnoses:  Laceration of left hand without foreign body, initial encounter    New Prescriptions Discharge Medication List as of 08/07/2016  3:10 PM    I personally performed the services described in this documentation, which was scribed in my presence. The recorded information has been reviewed and is accurate.     Evalee Jefferson, PA-C 08/08/16 2116    Fatima Blank, MD 08/11/16 [phone]

## 2016-08-07 NOTE — ED Triage Notes (Signed)
Patient cut her left hand on a solar light in her yard while she was trying to move them. Laceration to left hand. Patient states she is not on blood thinners. Bleeding is controled at this time.

## 2016-08-07 NOTE — Discharge Instructions (Signed)
Have your sutures removed in 10 days.  Keep your wound clean and dry,  Until a good scab forms - you may then wash gently twice daily with mild soap and water, but dry completely after.  Get rechecked for any sign of infection (redness,  Swelling,  Increased pain or drainage of purulent fluid). ° °

## 2016-08-08 ENCOUNTER — Other Ambulatory Visit: Payer: Self-pay | Admitting: Internal Medicine

## 2016-08-10 DIAGNOSIS — L720 Epidermal cyst: Secondary | ICD-10-CM | POA: Diagnosis not present

## 2016-08-10 DIAGNOSIS — N61 Mastitis without abscess: Secondary | ICD-10-CM | POA: Diagnosis not present

## 2016-08-10 DIAGNOSIS — E039 Hypothyroidism, unspecified: Secondary | ICD-10-CM | POA: Diagnosis not present

## 2016-08-10 DIAGNOSIS — Z886 Allergy status to analgesic agent status: Secondary | ICD-10-CM | POA: Diagnosis not present

## 2016-08-10 DIAGNOSIS — M797 Fibromyalgia: Secondary | ICD-10-CM | POA: Diagnosis not present

## 2016-08-10 DIAGNOSIS — Z880 Allergy status to penicillin: Secondary | ICD-10-CM | POA: Diagnosis not present

## 2016-08-10 DIAGNOSIS — Z9189 Other specified personal risk factors, not elsewhere classified: Secondary | ICD-10-CM | POA: Diagnosis not present

## 2016-08-10 DIAGNOSIS — E785 Hyperlipidemia, unspecified: Secondary | ICD-10-CM | POA: Diagnosis not present

## 2016-08-10 DIAGNOSIS — Z91013 Allergy to seafood: Secondary | ICD-10-CM | POA: Diagnosis not present

## 2016-08-10 DIAGNOSIS — T8543XA Leakage of breast prosthesis and implant, initial encounter: Secondary | ICD-10-CM | POA: Diagnosis not present

## 2016-08-10 DIAGNOSIS — I872 Venous insufficiency (chronic) (peripheral): Secondary | ICD-10-CM | POA: Diagnosis not present

## 2016-08-10 DIAGNOSIS — N951 Menopausal and female climacteric states: Secondary | ICD-10-CM | POA: Diagnosis not present

## 2016-08-10 DIAGNOSIS — M810 Age-related osteoporosis without current pathological fracture: Secondary | ICD-10-CM | POA: Diagnosis not present

## 2016-08-10 DIAGNOSIS — K219 Gastro-esophageal reflux disease without esophagitis: Secondary | ICD-10-CM | POA: Diagnosis not present

## 2016-08-10 DIAGNOSIS — Z882 Allergy status to sulfonamides status: Secondary | ICD-10-CM | POA: Diagnosis not present

## 2016-08-10 DIAGNOSIS — Z7951 Long term (current) use of inhaled steroids: Secondary | ICD-10-CM | POA: Diagnosis not present

## 2016-08-10 DIAGNOSIS — G43909 Migraine, unspecified, not intractable, without status migrainosus: Secondary | ICD-10-CM | POA: Diagnosis not present

## 2016-08-10 DIAGNOSIS — K589 Irritable bowel syndrome without diarrhea: Secondary | ICD-10-CM | POA: Diagnosis not present

## 2016-08-10 DIAGNOSIS — Z881 Allergy status to other antibiotic agents status: Secondary | ICD-10-CM | POA: Diagnosis not present

## 2016-08-10 DIAGNOSIS — Z91048 Other nonmedicinal substance allergy status: Secondary | ICD-10-CM | POA: Diagnosis not present

## 2016-08-10 DIAGNOSIS — Z9013 Acquired absence of bilateral breasts and nipples: Secondary | ICD-10-CM | POA: Diagnosis not present

## 2016-08-10 DIAGNOSIS — Z885 Allergy status to narcotic agent status: Secondary | ICD-10-CM | POA: Diagnosis not present

## 2016-08-10 DIAGNOSIS — N641 Fat necrosis of breast: Secondary | ICD-10-CM | POA: Diagnosis not present

## 2016-08-10 DIAGNOSIS — T8549XA Other mechanical complication of breast prosthesis and implant, initial encounter: Secondary | ICD-10-CM | POA: Diagnosis not present

## 2016-08-10 DIAGNOSIS — Z79899 Other long term (current) drug therapy: Secondary | ICD-10-CM | POA: Diagnosis not present

## 2016-08-10 DIAGNOSIS — Z79890 Hormone replacement therapy (postmenopausal): Secondary | ICD-10-CM | POA: Diagnosis not present

## 2016-08-10 DIAGNOSIS — Z888 Allergy status to other drugs, medicaments and biological substances status: Secondary | ICD-10-CM | POA: Diagnosis not present

## 2016-08-10 DIAGNOSIS — L728 Other follicular cysts of the skin and subcutaneous tissue: Secondary | ICD-10-CM | POA: Diagnosis not present

## 2016-09-03 ENCOUNTER — Other Ambulatory Visit: Payer: Self-pay | Admitting: Internal Medicine

## 2016-09-15 NOTE — Progress Notes (Signed)
[name] [place],  [address] Phone: [phone] Subjective:    I'm seeing this patient by the request  of:    CC: Chronic back pain  ERD:[id number]  Laura Barnes is a 76 y.o. female coming in with complaint of chronic low back pain. Patient's past medical history is significant for an L5-S1 disc protrusion last seen on MRI in 2011 that was independently visualized by me. Patient is having chronic pain and does see a pain management. Patient states unfortunate she is having pain along her. Seems to be somewhat in her left knee, back, lower leg, bilateral shoulders. States that it seems to be worsening. Has tried many different medications in the past and had significant number of allergies periods surprisingly not allergic to any type of or.. Patient is seen a chronic pain doctor but unfortunately has been fired she states. States it is because she did not want to take the opiates. Patient states that she is having worsening pain overall. Worsening of what else can be done. Denies any fevers chills or any abnormal weight loss.     Past Medical History:  Diagnosis Date  . ALLERGIC RHINITIS   . Anemia   . ANXIETY   . Bronchitis   . Complication of anesthesia    says one time waking up she couldn't breathe, felt like her throat closing up  . DEPRESSION   . Enthesopathy of hip region   . Family history of anesthesia complication    sister with n/v  . FOOT PAIN, BILATERAL   . GERD   . Hiatal hernia   . HYPERLIPIDEMIA   . Hypothyroidism   . IBS   . MIGRAINE HEADACHE   . MIGRAINE, COMMON   . NECK MASS   . OSTEOPENIA   . OSTEOPOROSIS   . PLANTAR FASCIITIS   . RASH-NONVESICULAR   . SINUSITIS- ACUTE-NOS   . SKIN LESION   . VAGINITIS   . VENOUS INSUFFICIENCY, CHRONIC    Past Surgical History:  Procedure Laterality Date  . APPENDECTOMY    . BREAST ENHANCEMENT SURGERY    . CHOLECYSTECTOMY    . COLONOSCOPY    . MASTECTOMY      bilateral for severe bilateral fibrocystic disease  . OOPHORECTOMY    . s/p neck lump removal    . SHOULDER SURGERY    . SINUS ENDO W/FUSION Right 06/30/2013   Procedure: RIGHT ENDOSCOPIC SPHENOIDECTOMY WITH FUSION SCAN;  Surgeon: [name], MD;  Location: [facility];  Service: ENT;  Laterality: Right;  . TONSILLECTOMY    . VAGINAL HYSTERECTOMY     Social History   Social History  . Marital status: Married    Spouse name: N/A  . Number of children: 2  . Years of education: N/A   Occupational History  . retired [employer] photographer    Social History Main Topics  . Smoking status: Never Smoker  . Smokeless tobacco: Never Used  . Alcohol use No  . Drug use: No  . Sexual activity: Not Asked   Other Topics Concern  . None   Social History Narrative   Has 2 biological children and 1 step child   Allergies  Allergen Reactions  . Prednisone Anaphylaxis    Patient unable to remember why she needed to steroid shot but just knows that when she got back to work she started having a lot of trouble breathing.  (jkl 05/04/14)  . Aspirin Other (See Comments)  Stomach ache and bleed  . Erythromycin Diarrhea  . Latex Other (See Comments)    blisters  . Levofloxacin Other (See Comments)    Headaches, GI upset  . Nsaids Other (See Comments)    stomach bleeding per pt  . Statins Nausea Only and Other (See Comments)    Headache, upset stomach, joint hurt, heartburn  . Sumatriptan Hives and Palpitations  . Adhesive [Tape] Other (See Comments)    blisters  . Amoxicillin-Pot Clavulanate   . Doxycycline   . Ivp Dye [Iodinated Diagnostic Agents]     If made with blue shellfish then CAN NOT have this type of dye.   . Methylphenidate Hcl   . Mirtazapine   . Shellfish Allergy     Blue shellfish  . Hydrocodone Itching  . Hydrocodone-Acetaminophen Itching  . Rofecoxib Nausea Only  . Sertraline Hcl Nausea Only  . Sulfonamide Derivatives Itching   Family History  Problem Relation Age of  Onset  . Diabetes Mother   . Heart disease Father   . Diabetes Sister   . Breast cancer Maternal Grandmother   . Heart disease Maternal Uncle     x 2  . Diabetes Other     multi relatives on father side  . Heart disease Maternal Aunt   . Kidney disease Paternal Uncle     questionable  . Colon cancer Neg Hx   . Colon polyps Neg Hx   . Rectal cancer Neg Hx   . Stomach cancer Neg Hx     Past medical history, social, surgical and family history all reviewed in electronic medical record.  No pertanent information unless stated regarding to the chief complaint.   Review of Systems: No visual changes, nausea, vomiting, diarrhea, constipation, dizziness, abdominal pain, skin rash, fevers, chills, night sweats, weight loss, swollen lymph nodes, , chest pain  Positive for headaches, abdominal pain intermittently, muscle aches, joint swelling, body aches, mood changes  Objective  Blood pressure 136/80, pulse 75, weight 145 lb (65.8 kg), SpO2 95 %. Systems examined below as of 09/16/16   General: No apparent distress alert and oriented x3 mood and affect normal, dressed appropriately.  HEENT: Pupils equal, extraocular movements intact  Respiratory: Patient's speak in full sentences and does not appear short of breath  Cardiovascular: No lower extremity edema, non tender, no erythema  Skin: Warm dry intact with no signs of infection or rash on extremities or on axial skeleton.  Abdomen: Soft nontender  Neuro: Cranial nerves II through XII are intact, neurovascularly intact in all extremities with 2+ DTRs and 2+ pulses.  Lymph: No lymphadenopathy of posterior or anterior cervical chain or axillae bilaterally.  Gait normal with good balance and coordination.  MSK:  tender with full range of motion and good stability and symmetric strength and tone of shoulders, elbows, wrist, hip, knee and ankles bilaterally. Significant arthritic changes of multiple joints. Pain is out of proportion for the  amount of palpation.     Impression and Recommendations:     This case required medical decision making of moderate complexity.      Note: This dictation was prepared with Dragon dictation along with smaller phrase technology. Any transcriptional errors that result from this process are unintentional.

## 2016-09-16 ENCOUNTER — Ambulatory Visit (INDEPENDENT_AMBULATORY_CARE_PROVIDER_SITE_OTHER): Payer: Medicare Other | Admitting: Family Medicine

## 2016-09-16 ENCOUNTER — Encounter: Payer: Self-pay | Admitting: Family Medicine

## 2016-09-16 ENCOUNTER — Other Ambulatory Visit (INDEPENDENT_AMBULATORY_CARE_PROVIDER_SITE_OTHER): Payer: Medicare Other

## 2016-09-16 ENCOUNTER — Ambulatory Visit (INDEPENDENT_AMBULATORY_CARE_PROVIDER_SITE_OTHER)
Admission: RE | Admit: 2016-09-16 | Discharge: 2016-09-16 | Disposition: A | Discharge status: Home / Self Care | Payer: Medicare Other | Source: Ambulatory Visit | Attending: Family Medicine | Admitting: Family Medicine

## 2016-09-16 VITALS — BP 136/80 | HR 75 | Wt 145.0 lb

## 2016-09-16 DIAGNOSIS — M797 Fibromyalgia: Secondary | ICD-10-CM

## 2016-09-16 DIAGNOSIS — Z0000 Encounter for general adult medical examination without abnormal findings: Secondary | ICD-10-CM | POA: Diagnosis not present

## 2016-09-16 DIAGNOSIS — M545 Low back pain: Secondary | ICD-10-CM

## 2016-09-16 DIAGNOSIS — M2040 Other hammer toe(s) (acquired), unspecified foot: Secondary | ICD-10-CM | POA: Diagnosis not present

## 2016-09-16 LAB — LIPID PANEL
Cholesterol: 193 mg/dL (ref 0–200)
HDL: 75.7 mg/dL (ref >39.00)
LDL Cholesterol: 99 mg/dL (ref 0–99)
NonHDL: 117.41
Total CHOL/HDL Ratio: 3
Triglycerides: 93 mg/dL (ref 0.0–149.0)
VLDL: 18.6 mg/dL (ref 0.0–40.0)

## 2016-09-16 LAB — CBC WITH DIFFERENTIAL/PLATELET
Basophils Absolute: 0 10*3/uL (ref 0.0–0.1)
Basophils Relative: 0.5 % (ref 0.0–3.0)
Eosinophils Absolute: 0.4 10*3/uL (ref 0.0–0.7)
Eosinophils Relative: 4.3 % (ref 0.0–5.0)
HCT: 38.2 % (ref 36.0–46.0)
Hemoglobin: 12.9 g/dL (ref 12.0–15.0)
Lymphocytes Relative: 23.8 % (ref 12.0–46.0)
Lymphs Abs: 2 10*3/uL (ref 0.7–4.0)
MCHC: 33.7 g/dL (ref 30.0–36.0)
MCV: 92.5 fl (ref 78.0–100.0)
Monocytes Absolute: 0.9 10*3/uL (ref 0.1–1.0)
Monocytes Relative: 10.3 % (ref 3.0–12.0)
Neutro Abs: 5.2 10*3/uL (ref 1.4–7.7)
Neutrophils Relative %: 61.1 % (ref 43.0–77.0)
Platelets: 239 10*3/uL (ref 150.0–400.0)
RBC: 4.12 Mil/uL (ref 3.87–5.11)
RDW: 15.1 % (ref 11.5–15.5)
WBC: 8.5 10*3/uL (ref 4.0–10.5)

## 2016-09-16 LAB — COMPREHENSIVE METABOLIC PANEL
ALT: 15 U/L (ref 0–35)
AST: 25 U/L (ref 0–37)
Albumin: 4 g/dL (ref 3.5–5.2)
Alkaline Phosphatase: 50 U/L (ref 39–117)
BUN: 10 mg/dL (ref 6–23)
CO2: 30 mEq/L (ref 19–32)
Calcium: 9.3 mg/dL (ref 8.4–10.5)
Chloride: 102 mEq/L (ref 96–112)
Creatinine, Ser: 0.93 mg/dL (ref 0.40–1.20)
GFR: 62.35 mL/min (ref >60.00)
Glucose, Bld: 85 mg/dL (ref 70–99)
Potassium: 3.8 mEq/L (ref 3.5–5.1)
Sodium: 138 mEq/L (ref 135–145)
Total Bilirubin: 0.4 mg/dL (ref 0.2–1.2)
Total Protein: 7.4 g/dL (ref 6.0–8.3)

## 2016-09-16 LAB — TSH: TSH: 4.86 u[IU]/mL — ABNORMAL HIGH (ref 0.35–4.50)

## 2016-09-16 NOTE — Progress Notes (Signed)
Pre-visit discussion using our clinic review tool. No additional management support is needed unless otherwise documented below in the visit note.  

## 2016-09-16 NOTE — Assessment & Plan Note (Signed)
Patient has fibromyalgia. Patient has been very noncompliant with different medications and states that she continues to have allergies to anything we have tried in the past. Have attempted injecting the left knee before which patient declined again. We discussed further workup of the back and patient didn't get an x-ray today. I believe that the patient's pain seems to be somewhat out of proportion and has declined many different treatment options. We discussed formal physical therapy which she declined. Patient will try to make the changes we suggested day and please see patient instructions for greater detail. Worsening symptoms patient is to seek medical attention immediately. Continued have pain we will need to try to refer her to a pain specialist.  Spent  25 minutes with patient face-to-face and had greater than 50% of counseling including as described above in assessment and plan.

## 2016-09-16 NOTE — Patient Instructions (Addendum)
Good to see you  I am sorry but do not have a lot of answers Xray of your back today  Heat then ice and do that multiple times during the day  I would get off the Kratom  I gave you a parking pass.  Consider tart cherry extract any dose at night in pill form.  Arnica lotion could be good as well.  See me again in 4 weeks.

## 2016-09-17 ENCOUNTER — Other Ambulatory Visit: Payer: Self-pay | Admitting: Internal Medicine

## 2016-09-17 ENCOUNTER — Encounter: Payer: Self-pay | Admitting: Internal Medicine

## 2016-09-17 DIAGNOSIS — E039 Hypothyroidism, unspecified: Secondary | ICD-10-CM

## 2016-09-17 MED ORDER — LEVOTHYROXINE SODIUM 25 MCG PO TABS: ORAL_TABLET | ORAL | 2 refills | Status: DC

## 2016-09-17 NOTE — Progress Notes (Unsigned)
blood work

## 2016-09-28 ENCOUNTER — Ambulatory Visit (INDEPENDENT_AMBULATORY_CARE_PROVIDER_SITE_OTHER): Payer: Medicare Other | Admitting: Internal Medicine

## 2016-09-28 ENCOUNTER — Encounter: Payer: Self-pay | Admitting: Internal Medicine

## 2016-09-28 DIAGNOSIS — R053 Chronic cough: Secondary | ICD-10-CM

## 2016-09-28 DIAGNOSIS — R0602 Shortness of breath: Secondary | ICD-10-CM

## 2016-09-28 DIAGNOSIS — R05 Cough: Secondary | ICD-10-CM | POA: Diagnosis not present

## 2016-09-28 NOTE — Progress Notes (Signed)
Subjective:     Patient ID: Laura Barnes, female   DOB: 1941-01-03, 76 y.o.   MRN: [id number]  HPI  HPI OV 03/03/2016  Chief Complaint  Patient presents with  . Acute Visit    Pt c/o increase in SOB, dry cough, throat irritation, 2 pillow orthopnea. Pt denies CP/tightness, chest congestion, f/c/s.   Acute visit for deterioration of chronic cough  76 year old female history gained from her and review of the old chart. According to the patient she has had chronic cough for 8 years which has no etiology. In early 2016 in mid 2016 she was seen by Dr. Annamaria Boots after having failed basic therapies with the primary care physician. Allergy testing was essentially negative except for mild elevation IgE against soy. She is not using  soy anyway and since then she's completely avoided it. However this did not help the cough. In between she is tried empiric long-acting anticholinergic which has not helped. For many years she's been on Tessalon cough Perles which helps take the edge off. Her cough is present day and night. It feels as though everything is closing in on her. The cough is associated with gag. She has chronic sinus issues for which Dr. Wilburn Cornelia has done surgery in the last few months . She also has ongoing acid reflux. She used to see Dr. Sharlett Iles  GI but is on PPI currentl but has frequent breakthrough. Medication review shows she is on turmeric and also omega-3 fatty acids all of which can provoke acid reflux. She's also on PPI. She's on antihistamine and nasal steroid.  Currently she reports cough has been worse for the last few to several weeks in the last few to shortness of breath that is more a chest tightness is also present. There is no associated fever of phlegm or edema or orthopnea paroxysmal nocturnal dyspnea. Her Exhaled nitric oxide today in the office  03/03/2016 - 16ppb and normal  She has ffibromyalgia and is wondring if cough is related   Review of the chart shows last  chest x-ray was April 2016 and clear. I personally visualized and done. She's never had CT chest.  Now had pulmonary function testing.    OV 03/23/2016  Chief Complaint  Patient presents with  . Follow-up    Pt here after HRCT and PFT. Pt states her SOB is unchanged. Pt states her cough is unchanged since last OV - dry cough. Pt denies CP/tightness and f/c/s.    Here for follow-up of chronic cough. She had pulmonary function test and CT scan reflected below. Cough is not any better. She says she is tried empiric inhalers in the past and does not want try them anymore. She does not want oice rehabilitation. She is allergic to gabapentin. She believes that fish oil and turmeric are not Contributing to acid reflux.   Pulmonary function test 03/23/2016 is essentially normal except for mild reduction in diffusion capacity DLCO 14.2/75%.   IMPRESSION: 1. No evidence of interstitial lung disease. 2. Mild air trapping, indicative of mild small airways disease. 3. Aortic atherosclerosis, in addition to 2 vessel coronary artery disease. Assessment for potential risk factor modification, dietary therapy or pharmacologic therapy may be warranted, if clinically indicated. 4. Additional incidental findings, as above.   Electronically Signed   By: Vinnie Langton M.D.   On: 03/09/2016 13:44   OV 09/28/2016  Chief Complaint  Patient presents with  . Follow-up    Pt c/o increase in SOB x 1 day.  Pt states she had a panic attack last night and she could not lay flat to sleep. Pt c/o dry cough. Pt denies f/c/s.      Follow-up idiopathic cough/cough neuropathy: She continues to be bothered by daily cough. It is refractory. She is not interested in gabapentin because of previous allergy. She's not interested in voice rehabilitation. In the past she was not interested in stopping fish oil as a trial but this time she tells me that she did not know that fish oil and provoke cough and is willing  to give a time limited trial off fish oil. We discussed research participation for chronic cough and she's not interested. Because her husband has prostate cancer and her time is being taken up supporting him  New issue shortness of breath: She says that she is known to suffer from panic attacks. Last night she had a spontaneous panic attack and this morning she did feel some nonspecific shortness of breath without any associated wheezing or sputum production or fever. She feels it is slowly easing off and she thinks it is related to the panic attack. Walking desaturation test 185 feet 3 laps on RA: She only did 2 laps and did not desaturate. She stopped because tired In her legs    has a past medical history of ALLERGIC RHINITIS; Anemia; ANXIETY; Bronchitis; Complication of anesthesia; DEPRESSION; Enthesopathy of hip region; Family history of anesthesia complication; FOOT PAIN, BILATERAL; GERD; Hiatal hernia; HYPERLIPIDEMIA; Hypothyroidism; IBS; MIGRAINE HEADACHE; MIGRAINE, COMMON; NECK MASS; OSTEOPENIA; OSTEOPOROSIS; PLANTAR FASCIITIS; RASH-NONVESICULAR; SINUSITIS- ACUTE-NOS; SKIN LESION; VAGINITIS; and VENOUS INSUFFICIENCY, CHRONIC.   reports that she has never smoked. She has never used smokeless tobacco.  Past Surgical History:  Procedure Laterality Date  . APPENDECTOMY    . BREAST ENHANCEMENT SURGERY    . CHOLECYSTECTOMY    . COLONOSCOPY    . MASTECTOMY     bilateral for severe bilateral fibrocystic disease  . OOPHORECTOMY    . s/p neck lump removal    . SHOULDER SURGERY    . SINUS ENDO W/FUSION Right 06/30/2013   Procedure: RIGHT ENDOSCOPIC SPHENOIDECTOMY WITH FUSION SCAN;  Surgeon: [name], MD;  Location: [facility];  Service: ENT;  Laterality: Right;  . TONSILLECTOMY    . VAGINAL HYSTERECTOMY      Allergies  Allergen Reactions  . Prednisone Anaphylaxis    Patient unable to remember why she needed to steroid shot but just knows that when she got back to work she started having  a lot of trouble breathing.  (jkl 05/04/14)  . Aspirin Other (See Comments)    Stomach ache and bleed  . Erythromycin Diarrhea  . Latex Other (See Comments)    blisters  . Levofloxacin Other (See Comments)    Headaches, GI upset  . Nsaids Other (See Comments)    stomach bleeding per pt  . Statins Nausea Only and Other (See Comments)    Headache, upset stomach, joint hurt, heartburn  . Sumatriptan Hives and Palpitations  . Adhesive [Tape] Other (See Comments)    blisters  . Amoxicillin-Pot Clavulanate   . Doxycycline   . Ivp Dye [Iodinated Diagnostic Agents]     If made with blue shellfish then CAN NOT have this type of dye.   . Methylphenidate Hcl   . Mirtazapine   . Shellfish Allergy     Blue shellfish  . Hydrocodone Itching  . Hydrocodone-Acetaminophen Itching  . Rofecoxib Nausea Only  . Sertraline Hcl Nausea Only  . Sulfonamide Derivatives  Itching    Immunization History  Administered Date(s) Administered  . Influenza Split 04/07/2011, 03/08/2012  . Influenza Whole 03/20/2008, 04/01/2009, 04/02/2010  . Influenza, High Dose Seasonal PF 04/12/2013, 03/26/2015, 03/04/2016  . Influenza,inj,Quad PF,36+ Mos 05/01/2014  . Pneumococcal Conjugate-13 05/02/2013  . Pneumococcal Polysaccharide-23 01/25/2006  . Td 09/17/2008  . Zoster 01/25/2006    Family History  Problem Relation Age of Onset  . Diabetes Mother   . Heart disease Father   . Diabetes Sister   . Breast cancer Maternal Grandmother   . Heart disease Maternal Uncle     x 2  . Diabetes Other     multi relatives on father side  . Heart disease Maternal Aunt   . Kidney disease Paternal Uncle     questionable  . Colon cancer Neg Hx   . Colon polyps Neg Hx   . Rectal cancer Neg Hx   . Stomach cancer Neg Hx      Current Outpatient Prescriptions:  .  ALPRAZolam (XANAX) 0.5 MG tablet, TAKE 1 TABLET BY MOUTH THREE TIMES DAILY AS NEEDED FOR ANXIETY, Disp: 90 tablet, Rfl: 2 .  Amino Acids (AMINO ACID PO), Take 1  capsule by mouth 2 (two) times daily., Disp: , Rfl:  .  B Complex-Biotin-FA (B COMPLETE) TABS, Take 1 tablet by mouth daily. B Complete 100mg , Disp: , Rfl:  .  benzonatate (TESSALON) 200 MG capsule, TAKE 1 CAPSULE(200 MG) BY MOUTH THREE TIMES DAILY AS NEEDED FOR COUGH, Disp: 90 capsule, Rfl: 5 .  buPROPion (WELLBUTRIN XL) 300 MG 24 hr tablet, Take 1 tablet (300 mg total) by mouth daily. --- Office visit needed for further refills, Disp: 90 tablet, Rfl: 0 .  Calcium-Vitamin D-Vitamin K (CALCIUM + D + K PO), Take 1 capsule by mouth daily., Disp: , Rfl:  .  cholecalciferol (VITAMIN D) 1000 UNITS tablet, Take 1,000 Units by mouth daily., Disp: , Rfl:  .  clidinium-chlordiazePOXIDE (LIBRAX) 5-2.5 MG per capsule, Take 1 capsule by mouth daily as needed (for stomach)., Disp: 60 capsule, Rfl: 4 .  Coenzyme Q10 (CO Q-10) 200 MG CAPS, Take 1 capsule by mouth 2 (two) times daily., Disp: , Rfl:  .  esomeprazole (NEXIUM) 20 MG capsule, Take 20 mg by mouth daily at 12 noon., Disp: , Rfl:  .  estradiol (ESTRACE) 1 MG tablet, TAKE 1 TABLET BY MOUTH EVERY DAY, Disp: 90 tablet, Rfl: 0 .  FLUoxetine (PROZAC) 20 MG capsule, TAKE 1 CAPSULE BY MOUTH EVERY DAY, Disp: 90 capsule, Rfl: 0 .  levothyroxine (SYNTHROID, LEVOTHROID) 25 MCG tablet, TAKE 1 TABLET BY MOUTH EVERY MORNING BEFORE BREAKFAST, TWO DAYS A WEEK TAKE TWO TABLETS, Disp: 98 tablet, Rfl: 2 .  Meperidine HCl (DEMEROL PO), Take by mouth as needed. Takes for headaches unsure dose, Disp: , Rfl:  .  Multiple Vitamin (MULTIVITAMIN WITH MINERALS) TABS tablet, Take 1 tablet by mouth daily., Disp: , Rfl:  .  Omega-3 Fatty Acids (SALMON OIL-1000 PO), Take 1,000 mg by mouth daily., Disp: , Rfl:  .  ondansetron (ZOFRAN-ODT) 8 MG disintegrating tablet, Take 1 tablet (8 mg total) by mouth every 8 (eight) hours as needed for nausea or vomiting., Disp: 20 tablet, Rfl: 3 .  Potassium 99 MG TABS, Take 99 mg by mouth daily., Disp: , Rfl:  .  triamcinolone cream (KENALOG) 0.5 %,  Apply topically 2 (two) times daily. As needed for rash, Disp: 30 g, Rfl: 0 .  triamterene-hydrochlorothiazide (MAXZIDE-25) 37.5-25 MG tablet, TAKE 1 TABLET BY  MOUTH EVERY DAY, Disp: 90 tablet, Rfl: 3 .  TURMERIC PO, Take by mouth., Disp: , Rfl:  .  cetirizine (ZYRTEC) 10 MG tablet, TAKE 1 TABLET (10 MG TOTAL) BY MOUTH DAILY. (Patient not taking: Reported on 09/28/2016), Disp: 30 tablet, Rfl: 11   Review of Systems     Objective:   Physical Exam     Vitals:   09/28/16 1240  BP: 112/74  Pulse: 86  SpO2: 95%  Weight: 148 lb 3.2 oz (67.2 kg)  Height: 5' (1.524 m)    Estimated body mass index is 28.94 kg/m as calculated from the following:   Height as of this encounter: 5' (1.524 m).   Weight as of this encounter: 148 lb 3.2 oz (67.2 kg).  Frail deconditioned female Significant flat affect Husband by side Alert and oriented 3. Speech normal. Lung sounds are clear without any respiratory distress Normal heart sounds Soft abdomen No sinus no clubbing no edema  Assessment:       ICD-9-CM ICD-10-CM   1. Chronic cough 786.2 R05   2. Shortness of breath 786.05 R06.02        Plan:     Chronic cough You have chronic cough due to cough neuropathy and irritable larynx At this point in time it is only supportive care Respect your desire to not participate in research program or voice rehabilitation Too bad you side effects of gabapentin Trial off fish oil for a few months and see if this helps her cough  Follow-up - 1 year or sooner if needed  Shortness of breath Probably related to the panic episode yesterday We'll keep an eye on this Return for this as needed    Dr. Brand Males, M.D., [facility].C.P Pulmonary and Critical Care Medicine Staff Physician [facility] Pager: [phone], If no answer or between  15:00h - 7:00h: call [phone]  09/28/2016 12:55 PM

## 2016-09-28 NOTE — Patient Instructions (Signed)
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

## 2016-09-28 NOTE — Assessment & Plan Note (Signed)
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

## 2016-09-28 NOTE — Assessment & Plan Note (Signed)
Probably related to the panic episode yesterday We'll keep an eye on this Return for this as needed

## 2016-10-01 ENCOUNTER — Encounter: Payer: Self-pay | Admitting: Podiatry

## 2016-10-01 ENCOUNTER — Ambulatory Visit (INDEPENDENT_AMBULATORY_CARE_PROVIDER_SITE_OTHER): Payer: Medicare Other | Admitting: Podiatry

## 2016-10-01 ENCOUNTER — Ambulatory Visit (INDEPENDENT_AMBULATORY_CARE_PROVIDER_SITE_OTHER): Payer: Medicare Other

## 2016-10-01 DIAGNOSIS — M2012 Hallux valgus (acquired), left foot: Secondary | ICD-10-CM

## 2016-10-01 DIAGNOSIS — M2042 Other hammer toe(s) (acquired), left foot: Secondary | ICD-10-CM

## 2016-10-01 NOTE — Patient Instructions (Signed)

## 2016-10-01 NOTE — Progress Notes (Signed)
   Subjective:    Patient ID: Laura Barnes, female    DOB: 1940/12/12, 76 y.o.   MRN: [id number]  HPI: She presents today for surgical consult regarding her left foot. She states that these bunions and tailor's bunions are really starting to get to me as well as these toes these toes are so painful it feels like the bones are coming through the bottom of the toes. She states that had this for quite some time as acting my ability to perform daily activities and stay healthy.    Review of Systems  Constitutional: Positive for fatigue.  HENT: Positive for sinus pressure and sneezing.   Respiratory: Positive for shortness of breath.   Gastrointestinal: Positive for nausea.  Musculoskeletal: Positive for arthralgias, back pain, gait problem and myalgias.  Neurological: Positive for headaches.  Hematological: Bruises/bleeds easily.  All other systems reviewed and are negative.      Objective:   Physical Exam: Vital signs are stable she's alert and oriented 3. Pulses are palpable. Neurologic sensorium is intact. Capillary fill time to digits 1 through 5 is immediate. Neurologic sensorium is intact per Semmes-Weinstein monofilament. Deep tendon reflexes are intact and muscle strength +5 over 5 dorsiflexion plantar flexors and inverters and everters all just musculature is intact. Orthopedic evaluation demonstrates moderate hallux valgus deformity and tailor bunion deformities left foot as opposed to the right. She also has contracted metatarsal phalangeal joints dorsally though these are flexible she also has hammertoe contractures at the level of the PIPJ's which are more rigid than not. These toes could not reach 180. Cutaneous evaluation shows supple well-hydrated is no erythema edema cellulitis drainage or odor. She does have a painful scar to the plantar aspect of the left foot where a plantar fibroma was excised many years ago. She states that the scars are painful she can barely put  weight down on the foot. She went to have this removed if possible. Radiographs taken today do demonstrate an increase in the first intermetatarsal angle greater than normal value as well as a hallux abductus angle. Also the fourth intermetatarsal space and angle is greater than normal with the lateral divergence of the fifth metatarsal consistent with tailor's bunion deformities. 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        Assessment & Plan:  Hammertoe deformities #2 #3 number for the left foot. 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 Painful scar on her aspect left foot.  Plan: We discussed the etiology pathology conservative versus surgical therapies. At this point I would like to schedule her for surgery we went over consent form today  Number number giving her appetite as well she saw fit regarding the first and fifth metatarsal osteotomy hammertoe repair #2 #3 #4 with screws on the left foot. We also discussed excision of a painful scar. She understands this is amenable to it we did discuss possible postop palpitations which may include but are not limited to postop pain bleeding swelling fashion recurrence and need further surgery overcorrection under correction.

## 2016-10-02 DIAGNOSIS — G44219 Episodic tension-type headache, not intractable: Secondary | ICD-10-CM | POA: Diagnosis not present

## 2016-10-02 DIAGNOSIS — G43009 Migraine without aura, not intractable, without status migrainosus: Secondary | ICD-10-CM | POA: Diagnosis not present

## 2016-10-28 ENCOUNTER — Other Ambulatory Visit (INDEPENDENT_AMBULATORY_CARE_PROVIDER_SITE_OTHER): Payer: Medicare Other

## 2016-10-28 ENCOUNTER — Encounter: Payer: Self-pay | Admitting: Internal Medicine

## 2016-10-28 ENCOUNTER — Ambulatory Visit (INDEPENDENT_AMBULATORY_CARE_PROVIDER_SITE_OTHER): Payer: Medicare Other | Admitting: Internal Medicine

## 2016-10-28 VITALS — BP 128/78 | HR 95 | Temp 97.8°F | Resp 16 | Wt 148.0 lb

## 2016-10-28 DIAGNOSIS — R110 Nausea: Secondary | ICD-10-CM | POA: Diagnosis not present

## 2016-10-28 DIAGNOSIS — Z01818 Encounter for other preprocedural examination: Secondary | ICD-10-CM | POA: Diagnosis not present

## 2016-10-28 DIAGNOSIS — L989 Disorder of the skin and subcutaneous tissue, unspecified: Secondary | ICD-10-CM | POA: Diagnosis not present

## 2016-10-28 DIAGNOSIS — E039 Hypothyroidism, unspecified: Secondary | ICD-10-CM

## 2016-10-28 DIAGNOSIS — R1310 Dysphagia, unspecified: Secondary | ICD-10-CM | POA: Insufficient documentation

## 2016-10-28 LAB — TSH: TSH: 2.71 u[IU]/mL (ref 0.35–4.50)

## 2016-10-28 MED ORDER — ONDANSETRON 8 MG PO TBDP: 8.0000 mg | ORAL_TABLET | Freq: Three times a day (TID) | ORAL | 3 refills | Status: DC | PRN

## 2016-10-28 MED ORDER — CILIDINIUM-CHLORDIAZEPOXIDE 2.5-5 MG PO CAPS: 1.0000 | ORAL_CAPSULE | Freq: Every day | ORAL | 4 refills | Status: DC | PRN

## 2016-10-28 MED FILL — CHLORDIAZEPOXIDE-CLIDINIUM: 5-2.5 | 60 days supply | Qty: 60 | Fill #0

## 2016-10-28 MED FILL — ONDANSETRON ODT 8 MG TABLET: 8 | 6 days supply | Qty: 20 | Fill #0

## 2016-10-28 NOTE — Assessment & Plan Note (Signed)
Check tsh  Titrate med dose if needed  

## 2016-10-28 NOTE — Assessment & Plan Note (Signed)
Hx of esophageal dilation in the past May need repeat dilation Referred to GI

## 2016-10-28 NOTE — Assessment & Plan Note (Signed)
Related to IBS Continue zofran as needed

## 2016-10-28 NOTE — Progress Notes (Signed)
Subjective:    Patient ID: Laura Barnes, female    DOB: 10-02-1940, 76 y.o.   MRN: [id number]  HPI She is here today for surgical clearance for foot surgery of her left foot.  She is having hammer toes fixed and a revison of scar on the bottom of her foot.  Dr Milinda Pointer will be doing the surgery on  11/06/16.     She has had several surgeries in the past.  She did wake up once after surgery and had difficulty breathing - she is not sure what she had at that time.  She did need an injection of something. She has not had that recur.  She denies bleeding and blood clots for herself.  She son did have blood clots, but he was driver and would drive for hours without stopping.   She does not exercise regularly due to fibromyalgia.   With her daily activities she denies chest pain or palpitations.  She has shortness of breath at rest and dyspnea on exertion and has seen pulmonary.  Inhalers did not help.  No concerning findings were found and she will follow up with him in one year.   She has some skin concerns today.   Medications and allergies reviewed with patient and updated if appropriate.  Patient Active Problem List   Diagnosis Date Noted  . Lump of skin 05/11/2016  . 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 Chronic meniscal tear of knee 03/05/2016  . Weak urinary stream 03/04/2016  . Nausea without vomiting 03/04/2016  . Shortness of breath 03/03/2016  . Urinary hesitancy 02/28/2016  . Lightheadedness 12/18/2015  . Osteopenia 12/03/2015  . Thyroid nodule 11/16/2015  . Bilateral leg edema 11/05/2015  . Anterior neck pain 11/05/2015  . Postmenopausal 11/05/2015  . Subacromial bursitis 09/16/2015  . Umbilical pain 67/59/1638  . 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 Trochanteric bursitis of both hips 03/12/2015  . Subacromial impingement of left shoulder 03/12/2015  . Hyperlipemia 05/01/2014  . Chronic pain syndrome 05/01/2014  . Lumbar  radiculopathy 03/27/2014  . Left shoulder pain 11/07/2013  . Degenerative cervical disc 10/10/2013  . Fibromyalgia 09/06/2013  . Bilateral shoulder pain 09/06/2013  . Urinary frequency 08/12/2013  . Low back pain 08/12/2013  . Chronic cough 04/12/2013  . Sinusitis, chronic 04/12/2013  . Hypothyroidism 04/07/2012  . Facial pain 10/01/2010  . VENOUS INSUFFICIENCY, CHRONIC 04/02/2010  . FOOT PAIN, BILATERAL 04/02/2010  . Enthesopathy of hip region 07/19/2007  . Anxiety state 01/23/2007  . Depression 01/23/2007  . Migraine 01/23/2007  . NINAR (noninfectious nonallergic rhinitis) 01/23/2007  . GERD 01/23/2007  . IBS 01/23/2007    Current Outpatient Prescriptions on File Prior to Visit  Medication Sig Dispense Refill  . ALPRAZolam (XANAX) 0.5 MG tablet TAKE 1 TABLET BY MOUTH THREE TIMES DAILY AS NEEDED FOR ANXIETY 90 tablet 2  . Amino Acids (AMINO ACID PO) Take 1 capsule by mouth 2 (two) times daily.    . B Complex-Biotin-FA (B COMPLETE) TABS Take 1 tablet by mouth daily. B Complete 100mg     . benzonatate (TESSALON) 200 MG capsule TAKE 1 CAPSULE(200 MG) BY MOUTH THREE TIMES DAILY AS NEEDED FOR COUGH 90 capsule 5  . buPROPion (WELLBUTRIN XL) 300 MG 24 hr tablet Take 1 tablet (300 mg total) by mouth daily. --- Office visit needed for further refills 90 tablet 0  . cetirizine (ZYRTEC) 10 MG tablet TAKE 1 TABLET (10 MG TOTAL) BY MOUTH DAILY. 30 tablet  11  . cholecalciferol (VITAMIN D) 1000 UNITS tablet Take 1,000 Units by mouth daily.    . Coenzyme Q10 (CO Q-10) 200 MG CAPS Take 1 capsule by mouth 2 (two) times daily.    Marland Kitchen esomeprazole (NEXIUM) 20 MG capsule Take 20 mg by mouth daily at 12 noon.    Marland Kitchen estradiol (ESTRACE) 1 MG tablet TAKE 1 TABLET BY MOUTH EVERY DAY 90 tablet 0  . FLUoxetine (PROZAC) 20 MG capsule TAKE 1 CAPSULE BY MOUTH EVERY DAY 90 capsule 0  . levothyroxine (SYNTHROID, LEVOTHROID) 25 MCG tablet TAKE 1 TABLET BY MOUTH EVERY MORNING BEFORE BREAKFAST, TWO DAYS A WEEK TAKE TWO  TABLETS 98 tablet 2  . Meperidine HCl (DEMEROL PO) Take by mouth as needed. Takes for headaches unsure dose    . Multiple Vitamin (MULTIVITAMIN WITH MINERALS) TABS tablet Take 1 tablet by mouth daily.    . Omega-3 Fatty Acids (SALMON OIL-1000 PO) Take 1,000 mg by mouth daily.    . Potassium 99 MG TABS Take 99 mg by mouth daily.    Marland Kitchen triamcinolone cream (KENALOG) 0.5 % Apply topically 2 (two) times daily. As needed for rash 30 g 0  . triamterene-hydrochlorothiazide (MAXZIDE-25) 37.5-25 MG tablet TAKE 1 TABLET BY MOUTH EVERY DAY 90 tablet 3  . TURMERIC PO Take by mouth.     No current facility-administered medications on file prior to visit.     Past Medical History:  Diagnosis Date  . ALLERGIC RHINITIS   . Anemia   . ANXIETY   . Bronchitis   . Complication of anesthesia    says one time waking up she couldn't breathe, felt like her throat closing up  . DEPRESSION   . Enthesopathy of hip region   . Family history of anesthesia complication    sister with n/v  . FOOT PAIN, BILATERAL   . GERD   . Hiatal hernia   . HYPERLIPIDEMIA   . Hypothyroidism   . IBS   . MIGRAINE HEADACHE   . MIGRAINE, COMMON   . NECK MASS   . OSTEOPENIA   . OSTEOPOROSIS   . PLANTAR FASCIITIS   . RASH-NONVESICULAR   . SINUSITIS- ACUTE-NOS   . SKIN LESION   . VAGINITIS   . VENOUS INSUFFICIENCY, CHRONIC     Past Surgical History:  Procedure Laterality Date  . APPENDECTOMY    . BREAST ENHANCEMENT SURGERY    . BREAST IMPLANT REMOVAL    . CHOLECYSTECTOMY    . COLONOSCOPY    . MASTECTOMY     bilateral for severe bilateral fibrocystic disease  . OOPHORECTOMY    . s/p neck lump removal    . SHOULDER SURGERY    . SINUS ENDO W/FUSION Right 06/30/2013   Procedure: RIGHT ENDOSCOPIC SPHENOIDECTOMY WITH FUSION SCAN;  Surgeon: [name], MD;  Location: [facility] [facility];  Service: ENT;  Laterality: Right;  . TONSILLECTOMY    . VAGINAL HYSTERECTOMY      Social History   Social History  . Marital status:  Married    Spouse name: N/A  . Number of children: 2  . Years of education: N/A   Occupational History  . retired Press photographer    Social History Main Topics  . Smoking status: Never Smoker  . Smokeless tobacco: Never Used  . Alcohol use No  . Drug use: No  . Sexual activity: Not Asked   Other Topics Concern  . None   Social History Narrative   Has 2 biological children and  1 step child    Family History  Problem Relation Age of Onset  . Diabetes Mother   . Heart disease Father   . Diabetes Sister   . Breast cancer Maternal Grandmother   . Heart disease Maternal Uncle        x 2  . Diabetes Other        multi relatives on father side  . Heart disease Maternal Aunt   . Kidney disease Paternal Uncle        questionable  . Colon cancer Neg Hx   . Colon polyps Neg Hx   . Rectal cancer Neg Hx   . Stomach cancer Neg Hx     Review of Systems  Constitutional: Positive for chills (with allergies) and fatigue. Negative for appetite change and fever.  HENT: Positive for sneezing (allergies) and trouble swallowing (hx of dilation in the past).   Eyes: Negative for visual disturbance.  Respiratory: Positive for cough (chronic) and shortness of breath. Negative for wheezing.   Cardiovascular: Positive for leg swelling (chronic, no change). Negative for chest pain and palpitations.  Gastrointestinal: Positive for diarrhea (IBS related) and nausea. Negative for abdominal pain, blood in stool and constipation.       Occ GERD  Genitourinary: Negative for dysuria and hematuria.  Musculoskeletal: Positive for myalgias.  Neurological: Negative for dizziness, light-headedness and headaches.       Objective:   Vitals:   10/28/16 1333  BP: 128/78  Pulse: 95  Resp: 16  Temp: 97.8 F (36.6 C)   Filed Weights   10/28/16 1333  Weight: 148 lb (67.1 kg)   Body mass index is 28.9 kg/m.  Wt Readings from Last 3 Encounters:  10/28/16 148 lb (67.1 kg)  09/28/16 148 lb 3.2 oz  (67.2 kg)  09/16/16 145 lb (65.8 kg)     Physical Exam Constitutional: She appears well-developed and well-nourished. No distress.  HENT:  Head: Normocephalic and atraumatic.  Right Ear: External ear normal. Normal ear canal and TM Left Ear: External ear normal.  Normal ear canal and TM Mouth/Throat: Oropharynx is clear and moist.  Eyes: Conjunctivae and EOM are normal.  Neck: Neck supple. No tracheal deviation present. No thyromegaly present.  No carotid bruit  Cardiovascular: Normal rate, regular rhythm and normal heart sounds.   No murmur heard.  Trace b/l edema. Pulmonary/Chest: Effort normal and breath sounds normal. No respiratory distress. She has no wheezes. She has no rales.  Abdominal: Soft. She exhibits no distension. There is no tenderness.  Lymphadenopathy: She has no cervical adenopathy.  Skin: Skin is warm and dry. She is not diaphoretic. areas of sun damage, areas of small lumps under skin and scars Psychiatric: She has a normal mood and affect. Her behavior is normal.         Assessment & Plan:   See Problem List for Assessment and Plan of chronic medical problems.

## 2016-10-28 NOTE — Assessment & Plan Note (Signed)
For left foot surgery by Dr Milinda Pointer 6/8 - hammer toe and revision of scar She is low risk for a low risk procedure.   Her medical problems are stable.  She has no known cardiac disease or pulmonary disease. Cleared for surgery

## 2016-10-28 NOTE — Patient Instructions (Addendum)
  Test(s) ordered today. Your results will be released to [place] (or called to you) after review, usually within 72hours after test completion. If any changes need to be made, you will be notified at that same time.     Medications reviewed and updated.  No changes recommended at this time.  Your prescription(s) have been submitted to your pharmacy. Please take as directed and contact our office if you believe you are having problem(s) with the medication(s).    A referral was ordered for GI and derm.

## 2016-10-28 NOTE — Assessment & Plan Note (Signed)
Referred to derm 

## 2016-11-02 ENCOUNTER — Telehealth: Payer: Self-pay | Admitting: *Deleted

## 2016-11-02 DIAGNOSIS — M2040 Other hammer toe(s) (acquired), unspecified foot: Secondary | ICD-10-CM

## 2016-11-02 DIAGNOSIS — M2012 Hallux valgus (acquired), left foot: Secondary | ICD-10-CM

## 2016-11-02 NOTE — Telephone Encounter (Signed)
"  I have been looking everywhere to find this scooter that he wants me to use.  I haven't had any luck.  The one I found will not be available until mid July and that is too late.  I need one that folds up so I can put it in the car."  The procedure you're having does no require you to be non-weight bearing.  You will not need a scooter.  "He distinctly told me I would not be putting weight on this foot.  My husband was in the room with me and he heard the same thing."  I am reading his note and I didn't see anything about it but I will go ahead and put the order in.  "I would appreciate it!"

## 2016-11-02 NOTE — Telephone Encounter (Signed)
Its the plantar excision of scar that would prevent her from walking for about three weeks.

## 2016-11-03 ENCOUNTER — Other Ambulatory Visit: Payer: Self-pay | Admitting: Internal Medicine

## 2016-11-03 NOTE — Telephone Encounter (Signed)
Sorry, I didn't have that procedure in my surgery book.

## 2016-11-04 ENCOUNTER — Other Ambulatory Visit: Payer: Self-pay | Admitting: Podiatry

## 2016-11-04 MED ORDER — CLINDAMYCIN HCL 150 MG PO CAPS: 150.0000 mg | ORAL_CAPSULE | Freq: Three times a day (TID) | ORAL | 0 refills | Status: DC

## 2016-11-04 MED ORDER — HYDROMORPHONE HCL 4 MG PO TABS: 4.0000 mg | ORAL_TABLET | ORAL | 0 refills | Status: DC | PRN

## 2016-11-04 MED ORDER — ONDANSETRON HCL 4 MG PO TABS: 4.0000 mg | ORAL_TABLET | Freq: Three times a day (TID) | ORAL | 0 refills | Status: DC | PRN

## 2016-11-06 ENCOUNTER — Encounter: Payer: Self-pay | Admitting: Podiatry

## 2016-11-06 DIAGNOSIS — M21542 Acquired clubfoot, left foot: Secondary | ICD-10-CM | POA: Diagnosis not present

## 2016-11-06 DIAGNOSIS — M21612 Bunion of left foot: Secondary | ICD-10-CM | POA: Diagnosis not present

## 2016-11-06 DIAGNOSIS — E7800 Pure hypercholesterolemia, unspecified: Secondary | ICD-10-CM | POA: Diagnosis not present

## 2016-11-06 DIAGNOSIS — M25572 Pain in left ankle and joints of left foot: Secondary | ICD-10-CM | POA: Diagnosis not present

## 2016-11-06 DIAGNOSIS — M2012 Hallux valgus (acquired), left foot: Secondary | ICD-10-CM | POA: Diagnosis not present

## 2016-11-06 DIAGNOSIS — M216X2 Other acquired deformities of left foot: Secondary | ICD-10-CM | POA: Diagnosis not present

## 2016-11-06 DIAGNOSIS — L905 Scar conditions and fibrosis of skin: Secondary | ICD-10-CM | POA: Diagnosis not present

## 2016-11-06 DIAGNOSIS — M2042 Other hammer toe(s) (acquired), left foot: Secondary | ICD-10-CM | POA: Diagnosis not present

## 2016-11-06 HISTORY — PX: FOOT SURGERY: SHX648

## 2016-11-10 ENCOUNTER — Other Ambulatory Visit: Payer: Self-pay | Admitting: Internal Medicine

## 2016-11-10 DIAGNOSIS — F419 Anxiety disorder, unspecified: Secondary | ICD-10-CM

## 2016-11-10 NOTE — Telephone Encounter (Signed)
[place] Controlled Substance Database checked. Okay to fill RX. Last filled on 10/13/16  Faxed to [facility]

## 2016-11-12 ENCOUNTER — Ambulatory Visit (INDEPENDENT_AMBULATORY_CARE_PROVIDER_SITE_OTHER): Payer: Medicare Other | Admitting: Podiatry

## 2016-11-12 ENCOUNTER — Ambulatory Visit (INDEPENDENT_AMBULATORY_CARE_PROVIDER_SITE_OTHER): Payer: Medicare Other

## 2016-11-12 DIAGNOSIS — M2012 Hallux valgus (acquired), left foot: Secondary | ICD-10-CM | POA: Diagnosis not present

## 2016-11-12 DIAGNOSIS — M2040 Other hammer toe(s) (acquired), unspecified foot: Secondary | ICD-10-CM

## 2016-11-12 DIAGNOSIS — M2042 Other hammer toe(s) (acquired), left foot: Secondary | ICD-10-CM

## 2016-11-15 NOTE — Progress Notes (Signed)
She presents today for postop visit date of surgery 11/06/2016 status post [facility] bunion repair left foot metatarsal osteotomy fifth left hammertoe repair #2 #3 #4 left states that she is doing okay only taking Motrin for pain.  Objective: Vital signs are stable alert and oriented 3. No erythema edema cellulitis drainage or odor. Sutures are intact margins are well coapted radiographs demonstrate well-healing and well-placed osteotomies and internal fixation.  Assessment: Well-healing surgical foot.  Plan: Redress today dresser compressive dressing follow-up with me in 1 week continue all conservative therapies.

## 2016-11-19 ENCOUNTER — Ambulatory Visit (INDEPENDENT_AMBULATORY_CARE_PROVIDER_SITE_OTHER): Payer: Self-pay | Admitting: Podiatry

## 2016-11-19 DIAGNOSIS — M2012 Hallux valgus (acquired), left foot: Secondary | ICD-10-CM

## 2016-11-19 DIAGNOSIS — M2040 Other hammer toe(s) (acquired), unspecified foot: Secondary | ICD-10-CM

## 2016-11-19 DIAGNOSIS — M2042 Other hammer toe(s) (acquired), left foot: Secondary | ICD-10-CM

## 2016-11-19 NOTE — Progress Notes (Signed)
She presents today status post excision painful scar plantar aspect of the left foot. Fifth metatarsal osteotomy first metatarsal osteotomy hammertoe repair with screws 2 through 4 left foot. She states that she's doing very well she continues to utilize her knee scooter. Denies painful calf shortness of breath or chest pain.  Objective: Vital signs are stable alert and oriented 3. Pulses are palpable. Dry sterile dressing was removed demonstrating sutures are intact margins well coapted staples are placed to the plantar aspect of the foot. At this point we will go ahead and remove all of the sutures today and redressed the foot will follow up with her in 2 week for staple removal. She's not allowed to get his foot wet yet and I will follow-up with her bedtime.  Assessment: Well-healing surgical foot.  Plan: Follow up with her in 2 weeks for staple removal.

## 2016-11-20 ENCOUNTER — Ambulatory Visit: Payer: Medicare Other | Admitting: Gastroenterology

## 2016-11-24 ENCOUNTER — Other Ambulatory Visit: Payer: Self-pay | Admitting: Internal Medicine

## 2016-11-25 ENCOUNTER — Telehealth: Payer: Self-pay | Admitting: Internal Medicine

## 2016-11-25 NOTE — Telephone Encounter (Signed)
Patient called in stating that you had left her message to call back.  Please follow up in regard.

## 2016-11-26 NOTE — Telephone Encounter (Signed)
Pt made aware of Dermatology appt.

## 2016-11-27 NOTE — Progress Notes (Signed)
DOS 06.08.2018   1. [place] bunion repair with screw left.   2. 5th Metatarsal osteotomy with screw left.   3. Hammertoe repair with screw #2, #3, #4 left.   4. Excision painful scar plantar left.

## 2016-11-29 ENCOUNTER — Other Ambulatory Visit: Payer: Self-pay | Admitting: Internal Medicine

## 2016-12-03 ENCOUNTER — Ambulatory Visit (INDEPENDENT_AMBULATORY_CARE_PROVIDER_SITE_OTHER): Payer: Medicare Other | Admitting: Podiatry

## 2016-12-03 ENCOUNTER — Encounter: Payer: Self-pay | Admitting: Podiatry

## 2016-12-03 ENCOUNTER — Ambulatory Visit: Payer: Medicare Other

## 2016-12-03 DIAGNOSIS — M2012 Hallux valgus (acquired), left foot: Secondary | ICD-10-CM

## 2016-12-03 DIAGNOSIS — M2042 Other hammer toe(s) (acquired), left foot: Secondary | ICD-10-CM

## 2016-12-03 NOTE — Progress Notes (Signed)
She presents today for a suture and staple remover of her left foot. At this point she had surgery done on 11/06/2016 status post [facility] bunion repair metatarsal osteotomy. Hammertoe repair #3 #4 and #2 with screws. She states that she's doing very well denies fever chills nausea vomiting muscle aches and pains.  Objective: Vital signs are stable she is alert and oriented 3. Pulses are palpable. Sutures and staples were removed today margins remain well coapted no signs of infection or signs of dehiscence area  Assessment: Well-healing surgical foot.  Plan: I encouraged her to continue range of motion activities and exercises will allow her to start standing on this but we did put her in a Darco shoe and a compression anklet area follow up with her in 2 weeks.

## 2016-12-04 ENCOUNTER — Telehealth: Payer: Self-pay | Admitting: *Deleted

## 2016-12-04 MED ORDER — BENZONATATE 200 MG PO CAPS: ORAL_CAPSULE | ORAL | 0 refills | Status: DC

## 2016-12-04 NOTE — Telephone Encounter (Signed)
sent 

## 2016-12-04 NOTE — Telephone Encounter (Signed)
Rec'd call pt states she was previously rx tessalon pearles by Dr. Gwenette Greet , and she no longer see him. The rx has expired pt is wanting MD to send rx to [facility]...Laura Barnes

## 2016-12-07 NOTE — Telephone Encounter (Signed)
Notified pt MD sent rx Friday afternoon to [facility]. Pt stated she did pick-up med on sat...Laura Barnes

## 2016-12-10 ENCOUNTER — Ambulatory Visit (INDEPENDENT_AMBULATORY_CARE_PROVIDER_SITE_OTHER): Payer: Medicare Other | Admitting: Family Medicine

## 2016-12-10 ENCOUNTER — Encounter: Payer: Self-pay | Admitting: Family Medicine

## 2016-12-10 VITALS — BP 130/80 | HR 85

## 2016-12-10 DIAGNOSIS — E039 Hypothyroidism, unspecified: Secondary | ICD-10-CM

## 2016-12-10 DIAGNOSIS — M797 Fibromyalgia: Secondary | ICD-10-CM

## 2016-12-10 MED ORDER — LEVOTHYROXINE SODIUM 75 MCG PO TABS: ORAL_TABLET | ORAL | 1 refills | Status: DC

## 2016-12-10 MED ORDER — KETOROLAC TROMETHAMINE 60 MG/2ML IM SOLN
60.0000 mg | Freq: Once | INTRAMUSCULAR | Status: AC
  Administered 2016-12-10: 60 mg via INTRAMUSCULAR

## 2016-12-10 MED ORDER — METHYLPREDNISOLONE ACETATE 80 MG/ML IJ SUSP
80.0000 mg | Freq: Once | INTRAMUSCULAR | Status: AC
  Administered 2016-12-10: 80 mg via INTRAMUSCULAR

## 2016-12-10 NOTE — Progress Notes (Signed)
[facility] [address] [place], [place] [address] Phone: [phone] Subjective:    CC: Pain all over  [id number]  Laura Barnes is a 76 y.o. female coming in with complaint of pain all over. Patient does have history of fibromyalgia. Patient did have surgery on her foot recently and has been nonweightbearing on the left side. Been using a scooter. This caused her back to her knowledge from the bottom of her shoulder blades only down to her toe she states. On the posterior aspect. Patient states it is unrelenting. Pain medication she is been given has not been helpful. No longer going to the pain clinic. Has been trying over-the-counter medications including cannabis oil with no significant improvement. Affecting daily activities as well as sleep. Rates the severity pain as 9 out of 10.     Past Medical History:  Diagnosis Date  . ALLERGIC RHINITIS   . Anemia   . ANXIETY   . Bronchitis   . Complication of anesthesia    says one time waking up she couldn't breathe, felt like her throat closing up  . DEPRESSION   . Enthesopathy of hip region   . Family history of anesthesia complication    sister with n/v  . FOOT PAIN, BILATERAL   . GERD   . Hiatal hernia   . HYPERLIPIDEMIA   . Hypothyroidism   . IBS   . MIGRAINE HEADACHE   . MIGRAINE, COMMON   . NECK MASS   . OSTEOPENIA   . OSTEOPOROSIS   . PLANTAR FASCIITIS   . RASH-NONVESICULAR   . SINUSITIS- ACUTE-NOS   . SKIN LESION   . VAGINITIS   . VENOUS INSUFFICIENCY, CHRONIC    Past Surgical History:  Procedure Laterality Date  . APPENDECTOMY    . BREAST ENHANCEMENT SURGERY    . BREAST IMPLANT REMOVAL    . CHOLECYSTECTOMY    . COLONOSCOPY    . MASTECTOMY     bilateral for severe bilateral fibrocystic disease  . OOPHORECTOMY    . s/p neck lump removal    . SHOULDER SURGERY    . SINUS ENDO W/FUSION Right 06/30/2013   Procedure: RIGHT ENDOSCOPIC SPHENOIDECTOMY WITH FUSION SCAN;   Surgeon: [name], MD;  Location: [place];  Service: ENT;  Laterality: Right;  . TONSILLECTOMY    . VAGINAL HYSTERECTOMY     Social History   Social History  . Marital status: Married    Spouse name: N/A  . Number of children: 2  . Years of education: N/A   Occupational History  . retired Press photographer    Social History Main Topics  . Smoking status: Never Smoker  . Smokeless tobacco: Never Used  . Alcohol use No  . Drug use: No  . Sexual activity: Not Asked   Other Topics Concern  . None   Social History Narrative   Has 2 biological children and 1 step child   Allergies  Allergen Reactions  . Prednisone Anaphylaxis    Patient unable to remember why she needed to steroid shot but just knows that when she got back to work she started having a lot of trouble breathing.  (jkl 05/04/14)  . Aspirin Other (See Comments)    Stomach ache and bleed  . Erythromycin Diarrhea  . Latex Other (See Comments)    blisters  . Levofloxacin Other (See Comments)    Headaches, GI upset  . Nsaids Other (See Comments)    stomach bleeding  per pt  . Statins Nausea Only and Other (See Comments)    Headache, upset stomach, joint hurt, heartburn  . Sumatriptan Hives and Palpitations  . Adhesive [Tape] Other (See Comments)    blisters  . Amoxicillin-Pot Clavulanate   . Doxycycline   . Ivp Dye [Iodinated Diagnostic Agents]     If made with blue shellfish then CAN NOT have this type of dye.   . Methylphenidate Hcl   . Mirtazapine   . Shellfish Allergy     Blue shellfish  . Hydrocodone Itching  . Hydrocodone-Acetaminophen Itching  . Rofecoxib Nausea Only  . Sertraline Hcl Nausea Only  . Sulfonamide Derivatives Itching   Family History  Problem Relation Age of Onset  . Diabetes Mother   . Heart disease Father   . Diabetes Sister   . Breast cancer Maternal Grandmother   . Heart disease Maternal Uncle        x 2  . Diabetes Other        multi relatives on father side  . Heart  disease Maternal Aunt   . Kidney disease Paternal Uncle        questionable  . Colon cancer Neg Hx   . Colon polyps Neg Hx   . Rectal cancer Neg Hx   . Stomach cancer Neg Hx     Past medical history, social, surgical and family history all reviewed in electronic medical record.  No pertanent information unless stated regarding to the chief complaint.   Review of Systems:Review of systems updated and as accurate as of 12/10/16  No visual changes, nausea, vomiting, diarrhea, constipation, dizziness,  skin rash, fevers, chills, night sweats, weight loss, swollen lymph nodes,joint swelling, chest pain, shortness of breath, mood changes.  Positive headaches, abdominal pain, body aches, muscle aches  Objective  Blood pressure 130/80, pulse 85, SpO2 97 %. Systems examined below as of 12/10/16   General: No apparent distress alert and oriented x3 mood and affect normal, dressed appropriately.  HEENT: Pupils equal, extraocular movements intact  Respiratory: Patient's speak in full sentences and does not appear short of breath  Cardiovascular: No lower extremity edema, non tender, no erythema  Skin: Warm dry intact with no signs of infection or rash on extremities or on axial skeleton.  Abdomen: Soft nontender  Neuro: Cranial nerves II through XII are intact, neurovascularly intact in all extremities with 2+ DTRs and 2+ pulses.  Lymph: No lymphadenopathy of posterior or anterior cervical chain or axillae bilaterally.  Gait Nonweightbearing on the left foot. MSK:  Severely tender with full range of motion and good stability and symmetric strength and tone of shoulders, elbows, wrist, hip, knee and ankles bilaterally. Arthritic changes noted no. Patient does have a postop boot on the left foot.    Impression and Recommendations:     This case required medical decision making of moderate complexity.      Note: This dictation was prepared with Dragon dictation along with smaller phrase  technology. Any transcriptional errors that result from this process are unintentional.

## 2016-12-10 NOTE — Assessment & Plan Note (Signed)
Fibromyalgia flare. Patient is having worsening symptoms. Could be contribute into with her thyroid as well. Given 2 injections today. We discussed icing regimen. We'll continue to monitor. Patient will follow-up in 2-4 weeks. Discussed with patient multiple times and not think there is anything significant can do. I am not currently to do chronic medications in this individual. Patient has RAD discontinued pain medications in pain management multiple times.

## 2016-12-10 NOTE — Assessment & Plan Note (Signed)
Increased Synthroid to 75 g

## 2016-12-10 NOTE — Patient Instructions (Addendum)
I a, sorry you are hurting.  Really difficult 2 injections today  Lets increase your synthroid to 38mcg daily.  I will tell Dr. Quay Burow.  I do not know what else to do but I hope this helps See me again in 2-4 weeks.

## 2016-12-11 ENCOUNTER — Other Ambulatory Visit: Payer: Self-pay

## 2016-12-11 ENCOUNTER — Telehealth: Payer: Self-pay | Admitting: Family Medicine

## 2016-12-11 MED ORDER — LEVOTHYROXINE SODIUM 75 MCG PO TABS: ORAL_TABLET | ORAL | 1 refills | Status: DC

## 2016-12-11 NOTE — Telephone Encounter (Signed)
Pharmacy never received levothyroxine (SYNTHROID, LEVOTHROID) 75 MCG tablet  Please resend to same pharmacy

## 2016-12-11 NOTE — Telephone Encounter (Signed)
Called pharmacy. They did not get the prescription yesterday that Dr. Tamala Julian sent. Resent Rx and called patient to let her know.

## 2016-12-12 ENCOUNTER — Other Ambulatory Visit: Payer: Self-pay | Admitting: Family Medicine

## 2016-12-14 ENCOUNTER — Other Ambulatory Visit: Payer: Self-pay

## 2016-12-14 MED ORDER — LEVOTHYROXINE SODIUM 75 MCG PO TABS: ORAL_TABLET | ORAL | 1 refills | Status: DC

## 2016-12-14 NOTE — Telephone Encounter (Signed)
[facility] [address]  Please send script to

## 2016-12-14 NOTE — Telephone Encounter (Signed)
Left message for patient to call back to confirm pharmacy that she wants Rx sent into.

## 2016-12-14 NOTE — Telephone Encounter (Signed)
Rx refaxed to [facility] on [address].

## 2016-12-14 NOTE — Telephone Encounter (Signed)
Pharmacy has still not received the medication, please resend again or call in if possible

## 2016-12-15 ENCOUNTER — Other Ambulatory Visit: Payer: Self-pay

## 2016-12-15 ENCOUNTER — Telehealth: Payer: Self-pay | Admitting: Family Medicine

## 2016-12-15 MED ORDER — LEVOTHYROXINE SODIUM 75 MCG PO TABS: ORAL_TABLET | ORAL | 1 refills | Status: DC

## 2016-12-15 NOTE — Telephone Encounter (Signed)
Patient states [facility] has not received levothyroxine.  Please follow up with pharmacy in regard.  Then notify patient.

## 2016-12-17 NOTE — Telephone Encounter (Signed)
Rx faxed into pharmacy. Unable to notify patient due to phones not working.

## 2016-12-24 ENCOUNTER — Other Ambulatory Visit: Payer: Self-pay | Admitting: Podiatry

## 2016-12-24 ENCOUNTER — Ambulatory Visit (INDEPENDENT_AMBULATORY_CARE_PROVIDER_SITE_OTHER): Payer: Medicare Other

## 2016-12-24 ENCOUNTER — Ambulatory Visit (INDEPENDENT_AMBULATORY_CARE_PROVIDER_SITE_OTHER): Payer: Self-pay | Admitting: Podiatry

## 2016-12-24 ENCOUNTER — Encounter: Payer: Self-pay | Admitting: Podiatry

## 2016-12-24 VITALS — BP 129/81 | HR 96 | Temp 97.4°F

## 2016-12-24 DIAGNOSIS — M2012 Hallux valgus (acquired), left foot: Secondary | ICD-10-CM

## 2016-12-24 DIAGNOSIS — Z9889 Other specified postprocedural states: Secondary | ICD-10-CM

## 2016-12-24 DIAGNOSIS — M2042 Other hammer toe(s) (acquired), left foot: Secondary | ICD-10-CM

## 2016-12-25 DIAGNOSIS — L814 Other melanin hyperpigmentation: Secondary | ICD-10-CM | POA: Diagnosis not present

## 2016-12-25 DIAGNOSIS — L821 Other seborrheic keratosis: Secondary | ICD-10-CM | POA: Diagnosis not present

## 2016-12-25 DIAGNOSIS — L570 Actinic keratosis: Secondary | ICD-10-CM | POA: Diagnosis not present

## 2016-12-25 NOTE — Progress Notes (Signed)
She presents today for postop visit status post excision of painful scar plantar aspect of the left foot and metatarsal osteotomy first metatarsal and hammertoe repair with screws second through the fourth. She states that is doing very well and continues to utilize her knee scooter on a regular basis.  Objective: Vital signs are stable she's alert and oriented 3 her wounds have gone on to heal uneventfully she has great range of motion and perfect alignment of her toes and bunion.  Assessment: Well-healing surgical foot bilateral.  Plan we'll allow her to get back into regular tennis shoes a possible follow-up with her in the next 2-3 weeks.

## 2016-12-29 ENCOUNTER — Ambulatory Visit: Payer: Medicare Other | Admitting: Gastroenterology

## 2016-12-29 ENCOUNTER — Other Ambulatory Visit: Payer: Self-pay

## 2016-12-30 NOTE — Progress Notes (Signed)
Subjective:    Patient ID: Laura Barnes, female    DOB: September 23, 1940, 76 y.o.   MRN: [id number]  HPI She is here for an acute visit for headaches.  About two weeks ago she had a severe headache and then got vertigo.  The vertigo lasted 3-4 days.  She did vomit.  She has never had vertigo previously. She has occasional feeling of dizziness.  This headache was slightly different than the one she has had since then.  At times she felt like her brain was going got fall out of her head.   She has had significant headaches that are not intractable.  The pain starts in her left forehead and is a pressure pain.  It radiates to her left temple, jaw and down her left neck toward her shoulder.  It causes nausea.  zofran does not help.  She is sipping on ginger-ale.  The demerol she had leftover from her neurologist has not helped.  The dilaudid she was given for her foot worked well for her head.  She tried her husbands oxycodone, which also helped.    She has migraines and this is different than anything she has ever had.  This headache is severe and the worst headache she has ever had.    Medications and allergies reviewed with patient and updated if appropriate.  Patient Active Problem List   Diagnosis Date Noted  . Dysphagia 10/28/2016  . Skin abnormalities 10/28/2016  . Preoperative clearance 10/28/2016  . Lump of skin 05/11/2016  . 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 Chronic meniscal tear of knee 03/05/2016  . Weak urinary stream 03/04/2016  . Nausea 03/04/2016  . Shortness of breath 03/03/2016  . Urinary hesitancy 02/28/2016  . Lightheadedness 12/18/2015  . Osteopenia 12/03/2015  . Thyroid nodule 11/16/2015  . Bilateral leg edema 11/05/2015  . Anterior neck pain 11/05/2015  . Postmenopausal 11/05/2015  . Subacromial bursitis 09/16/2015  . Umbilical pain 58/85/0277  . 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 Trochanteric bursitis of both hips  03/12/2015  . Subacromial impingement of left shoulder 03/12/2015  . Breast implant deflation 06/05/2014  . Hyperlipidemia 05/01/2014  . Chronic pain syndrome 05/01/2014  . Abnormal breast finding 05/01/2014  . Lumbar radiculopathy 03/27/2014  . Left shoulder pain 11/07/2013  . Degeneration of intervertebral disc of cervical region 10/10/2013  . Fibromyalgia 09/06/2013  . Bilateral shoulder pain 09/06/2013  . Increased frequency of urination 08/12/2013  . Low back pain 08/12/2013  . Chronic cough 04/12/2013  . Sinusitis, chronic 04/12/2013  . Menopausal flushing 11/15/2012  . Hypothyroidism 04/07/2012  . Facial pain 10/01/2010  . Chronic venous insufficiency 04/02/2010  . FOOT PAIN, BILATERAL 04/02/2010  . Enthesopathy of hip region 07/19/2007  . Osteoporosis 07/19/2007  . Anxiety state 01/23/2007  . Depression 01/23/2007  . Migraine 01/23/2007  . NINAR (noninfectious nonallergic rhinitis) 01/23/2007  . Gastroesophageal reflux disease 01/23/2007  . IBS 01/23/2007    Current Outpatient Prescriptions on File Prior to Visit  Medication Sig Dispense Refill  . ALPRAZolam (XANAX) 0.5 MG tablet TAKE 1 TABLET BY MOUTH THREE TIMES DAILY AS NEEDED FOR ANXIETY 90 tablet 1  . Amino Acids (AMINO ACID PO) Take 1 capsule by mouth 2 (two) times daily.    . B Complex-Biotin-FA (B COMPLETE) TABS Take 1 tablet by mouth daily. B Complete 100mg     . benzonatate (TESSALON) 200 MG capsule TAKE 1 CAPSULE(200 MG) BY MOUTH THREE TIMES DAILY AS NEEDED FOR  COUGH 90 capsule 0  . buPROPion (WELLBUTRIN XL) 300 MG 24 hr tablet TAKE 1 TABLET BY MOUTH DAILY 90 tablet 1  . cetirizine (ZYRTEC) 10 MG tablet TAKE 1 TABLET (10 MG TOTAL) BY MOUTH DAILY. 30 tablet 11  . cholecalciferol (VITAMIN D) 1000 UNITS tablet Take 1,000 Units by mouth daily.    . Coenzyme Q10 (CO Q-10) 200 MG CAPS Take 1 capsule by mouth 2 (two) times daily.    Marland Kitchen esomeprazole (NEXIUM) 20 MG capsule Take 20 mg by mouth daily at 12 noon.    Marland Kitchen  estradiol (ESTRACE) 1 MG tablet TAKE 1 TABLET BY MOUTH EVERY DAY 90 tablet 0  . FLUoxetine (PROZAC) 20 MG capsule TAKE 1 CAPSULE BY MOUTH EVERY DAY 90 capsule 1  . HYDROmorphone (DILAUDID) 4 MG tablet Take 1 tablet (4 mg total) by mouth every 4 (four) hours as needed for severe pain. 30 tablet 0  . levothyroxine (SYNTHROID, LEVOTHROID) 75 MCG tablet TAKE 1 TABLET BY MOUTH EVERY MORNING 30 tablet 1  . Multiple Vitamin (MULTIVITAMIN WITH MINERALS) TABS tablet Take 1 tablet by mouth daily.    . Omega-3 Fatty Acids (SALMON OIL-1000 PO) Take 1,000 mg by mouth daily.    . ondansetron (ZOFRAN) 4 MG tablet Take 1 tablet (4 mg total) by mouth every 8 (eight) hours as needed. 20 tablet 0  . Potassium 99 MG TABS Take 99 mg by mouth daily.    Marland Kitchen triamcinolone cream (KENALOG) 0.5 % Apply topically 2 (two) times daily. As needed for rash 30 g 0  . triamterene-hydrochlorothiazide (MAXZIDE-25) 37.5-25 MG tablet TAKE 1 TABLET BY MOUTH EVERY DAY 90 tablet 3  . TURMERIC PO Take by mouth.     No current facility-administered medications on file prior to visit.     Past Medical History:  Diagnosis Date  . ALLERGIC RHINITIS   . Anemia   . ANXIETY   . Bronchitis   . Complication of anesthesia    says one time waking up she couldn't breathe, felt like her throat closing up  . DEPRESSION   . Enthesopathy of hip region   . Family history of anesthesia complication    sister with n/v  . FOOT PAIN, BILATERAL   . GERD   . Hiatal hernia   . HYPERLIPIDEMIA   . Hypothyroidism   . IBS   . MIGRAINE HEADACHE   . MIGRAINE, COMMON   . NECK MASS   . OSTEOPENIA   . OSTEOPOROSIS   . PLANTAR FASCIITIS   . RASH-NONVESICULAR   . SINUSITIS- ACUTE-NOS   . SKIN LESION   . VAGINITIS   . VENOUS INSUFFICIENCY, CHRONIC     Past Surgical History:  Procedure Laterality Date  . APPENDECTOMY    . BREAST ENHANCEMENT SURGERY    . BREAST IMPLANT REMOVAL    . CHOLECYSTECTOMY    . COLONOSCOPY    . MASTECTOMY     bilateral  for severe bilateral fibrocystic disease  . OOPHORECTOMY    . s/p neck lump removal    . SHOULDER SURGERY    . SINUS ENDO W/FUSION Right 06/30/2013   Procedure: RIGHT ENDOSCOPIC SPHENOIDECTOMY WITH FUSION SCAN;  Surgeon: [name], MD;  Location: [facility];  Service: ENT;  Laterality: Right;  . TONSILLECTOMY    . VAGINAL HYSTERECTOMY      Social History   Social History  . Marital status: Married    Spouse name: N/A  . Number of children: 2  . Years of  education: N/A   Occupational History  . retired Press photographer    Social History Main Topics  . Smoking status: Never Smoker  . Smokeless tobacco: Never Used  . Alcohol use No  . Drug use: No  . Sexual activity: Not on file   Other Topics Concern  . Not on file   Social History Narrative   Has 2 biological children and 1 step child    Family History  Problem Relation Age of Onset  . Diabetes Mother   . Heart disease Father   . Diabetes Sister   . Breast cancer Maternal Grandmother   . Heart disease Maternal Uncle        x 2  . Diabetes Other        multi relatives on father side  . Heart disease Maternal Aunt   . Kidney disease Paternal Uncle        questionable  . Colon cancer Neg Hx   . Colon polyps Neg Hx   . Rectal cancer Neg Hx   . Stomach cancer Neg Hx     Review of Systems  Constitutional: Negative for chills and fever.  HENT: Positive for trouble swallowing (possible esophageal stricture which she has had in the past).   Eyes: Positive for photophobia and visual disturbance (related to headache, nausea, vertigo).  Respiratory: Positive for shortness of breath.   Cardiovascular: Negative for chest pain and palpitations.  Gastrointestinal: Positive for nausea and vomiting. Negative for abdominal pain.  Neurological: Positive for dizziness, numbness (right leg - not new - from fibromyalgia) and headaches. Negative for speech difficulty and weakness.       Objective:   Vitals:   12/31/16 0859  BP:  (!) 164/90  Pulse: 82  Resp: 16  Temp: 98.2 F (36.8 C)   Filed Weights   12/31/16 0859  Weight: 144 lb (65.3 kg)   Body mass index is 28.12 kg/m.  Wt Readings from Last 3 Encounters:  12/31/16 144 lb (65.3 kg)  10/28/16 148 lb (67.1 kg)  09/28/16 148 lb 3.2 oz (67.2 kg)     Physical Exam  Constitutional: She is oriented to person, place, and time. She appears well-developed and well-nourished. No distress.  HENT:  Head: Normocephalic and atraumatic.  Eyes: Pupils are equal, round, and reactive to light. Conjunctivae and EOM are normal.  Neck: Normal range of motion. Neck supple. No tracheal deviation present. No thyromegaly present.  Cardiovascular: Normal rate, regular rhythm and normal heart sounds.   Pulmonary/Chest: Effort normal and breath sounds normal. No respiratory distress. She has no wheezes. She has no rales.  Musculoskeletal: She exhibits no edema.  Lymphadenopathy:    She has no cervical adenopathy.  Neurological: She is alert and oriented to person, place, and time. No cranial nerve deficit. She exhibits normal muscle tone. Coordination normal.  Decreased sensation in upper right leg, otherwise sensation normal in all extremities.  Normal strength in all extremities.  Gait normal  Skin: Skin is warm and dry. She is not diaphoretic.  Psychiatric: She has a normal mood and affect. Her behavior is normal.          Assessment & Plan:   See Problem List for Assessment and Plan of chronic medical problems.

## 2016-12-31 ENCOUNTER — Encounter: Payer: Self-pay | Admitting: Internal Medicine

## 2016-12-31 ENCOUNTER — Ambulatory Visit (INDEPENDENT_AMBULATORY_CARE_PROVIDER_SITE_OTHER): Payer: Medicare Other | Admitting: Internal Medicine

## 2016-12-31 DIAGNOSIS — R51 Headache: Secondary | ICD-10-CM

## 2016-12-31 DIAGNOSIS — G8929 Other chronic pain: Secondary | ICD-10-CM | POA: Insufficient documentation

## 2016-12-31 DIAGNOSIS — R519 Headache, unspecified: Secondary | ICD-10-CM

## 2016-12-31 MED ORDER — HYDROMORPHONE HCL 4 MG PO TABS: 4.0000 mg | ORAL_TABLET | ORAL | 0 refills | Status: DC | PRN

## 2016-12-31 MED FILL — HYDROmorphone HCL 4 MG TABS: 4 | 7 days supply | Qty: 30 | Fill #0

## 2016-12-31 NOTE — Assessment & Plan Note (Addendum)
She has a history of migraines - this is worse than any other headache she has ever had No neurological deficits Associated with vertigo ? Atypical migraine for her, neuralgia, aneurysm - will get MRI of brain Increase zofran to 8 mg as needed Dilaudid for pain May need to see neuro

## 2016-12-31 NOTE — Patient Instructions (Signed)
Try taking two zofran (8 mg) for the nausea.  If this does not help let me know.   Take the dilaudid only as needed for pain.  An MRI of your brain was ordered - someone will call you to schedule this.

## 2017-01-04 ENCOUNTER — Ambulatory Visit
Admission: RE | Admit: 2017-01-04 | Discharge: 2017-01-04 | Disposition: A | Discharge status: Home / Self Care | Payer: Medicare Other | Source: Ambulatory Visit | Attending: Internal Medicine | Admitting: Internal Medicine

## 2017-01-04 DIAGNOSIS — R519 Headache, unspecified: Secondary | ICD-10-CM

## 2017-01-04 DIAGNOSIS — G43909 Migraine, unspecified, not intractable, without status migrainosus: Secondary | ICD-10-CM | POA: Diagnosis not present

## 2017-01-04 DIAGNOSIS — R51 Headache: Principal | ICD-10-CM

## 2017-01-07 ENCOUNTER — Encounter: Payer: Self-pay | Admitting: Family Medicine

## 2017-01-07 ENCOUNTER — Ambulatory Visit (INDEPENDENT_AMBULATORY_CARE_PROVIDER_SITE_OTHER): Payer: Medicare Other | Admitting: Family Medicine

## 2017-01-07 VITALS — BP 140/88 | HR 92 | Wt 147.0 lb

## 2017-01-07 DIAGNOSIS — E039 Hypothyroidism, unspecified: Secondary | ICD-10-CM

## 2017-01-07 DIAGNOSIS — M797 Fibromyalgia: Secondary | ICD-10-CM

## 2017-01-07 NOTE — Progress Notes (Signed)
[name] [address], [place] [address] Phone: [phone] Subjective:    CC: Pain all over f/u  IOX:[id number]  Laura Barnes is a 76 y.o. female coming in with complaint of pain all over. Patient does have history of fibromyalgia. Patient is been a very difficult patient with patient having multiple pains. Patient has had multiple areas of pain and has a lot of different treatment. We did increase patient's Synthroid at last visit. Very mild energy. Has not helped any of the pain though. Patient continues have pain and feels like it is more on the anterior aspect the hip. Also having worsening headaches. Was sent for an MRI by primary care provider that was unremarkable. Still having the discomfort. Continues natural medications including CBD oil but states it to sit funny on her last refill.     Past Medical History:  Diagnosis Date  . ALLERGIC RHINITIS   . Anemia   . ANXIETY   . Bronchitis   . Complication of anesthesia    says one time waking up she couldn't breathe, felt like her throat closing up  . DEPRESSION   . Enthesopathy of hip region   . Family history of anesthesia complication    sister with n/v  . FOOT PAIN, BILATERAL   . GERD   . Hiatal hernia   . HYPERLIPIDEMIA   . Hypothyroidism   . IBS   . MIGRAINE HEADACHE   . MIGRAINE, COMMON   . NECK MASS   . OSTEOPENIA   . OSTEOPOROSIS   . PLANTAR FASCIITIS   . RASH-NONVESICULAR   . SINUSITIS- ACUTE-NOS   . SKIN LESION   . VAGINITIS   . VENOUS INSUFFICIENCY, CHRONIC    Past Surgical History:  Procedure Laterality Date  . APPENDECTOMY    . BREAST ENHANCEMENT SURGERY    . BREAST IMPLANT REMOVAL    . CHOLECYSTECTOMY    . COLONOSCOPY    . MASTECTOMY     bilateral for severe bilateral fibrocystic disease  . OOPHORECTOMY    . s/p neck lump removal    . SHOULDER SURGERY    . SINUS ENDO W/FUSION Right 06/30/2013   Procedure: RIGHT ENDOSCOPIC SPHENOIDECTOMY WITH  FUSION SCAN;  Surgeon: [name], MD;  Location: [facility];  Service: ENT;  Laterality: Right;  . TONSILLECTOMY    . VAGINAL HYSTERECTOMY     Social History   Social History  . Marital status: Married    Spouse name: N/A  . Number of children: 2  . Years of education: N/A   Occupational History  . retired Press photographer    Social History Main Topics  . Smoking status: Never Smoker  . Smokeless tobacco: Never Used  . Alcohol use No  . Drug use: No  . Sexual activity: Not Asked   Other Topics Concern  . None   Social History Narrative   Has 2 biological children and 1 step child   Allergies  Allergen Reactions  . Prednisone Anaphylaxis    Patient unable to remember why she needed to steroid shot but just knows that when she got back to work she started having a lot of trouble breathing.  (jkl 05/04/14)  . Aspirin Other (See Comments)    Stomach ache and bleed  . Erythromycin Diarrhea  . Latex Other (See Comments)    blisters  . Levofloxacin Other (See Comments)    Headaches, GI upset  . Nsaids Other (See Comments)  stomach bleeding per pt  . Statins Nausea Only and Other (See Comments)    Headache, upset stomach, joint hurt, heartburn  . Sumatriptan Hives and Palpitations  . Adhesive [Tape] Other (See Comments)    blisters  . Amoxicillin-Pot Clavulanate   . Doxycycline   . Ivp Dye [Iodinated Diagnostic Agents]     If made with blue shellfish then CAN NOT have this type of dye.   . Methylphenidate Hcl   . Mirtazapine   . Shellfish Allergy     Blue shellfish  . Hydrocodone Itching  . Hydrocodone-Acetaminophen Itching  . Rofecoxib Nausea Only  . Sertraline Hcl Nausea Only  . Sulfonamide Derivatives Itching   Family History  Problem Relation Age of Onset  . Diabetes Mother   . Heart disease Father   . Diabetes Sister   . Breast cancer Maternal Grandmother   . Heart disease Maternal Uncle        x 2  . Diabetes Other        multi relatives on father side    . Heart disease Maternal Aunt   . Kidney disease Paternal Uncle        questionable  . Colon cancer Neg Hx   . Colon polyps Neg Hx   . Rectal cancer Neg Hx   . Stomach cancer Neg Hx     Past medical history, social, surgical and family history all reviewed in electronic medical record.  No pertanent information unless stated regarding to the chief complaint.   Review of Systems:Review of systems updated and as accurate as of 01/07/17  No visual changes, nausea, vomiting, diarrhea, , dizziness,  skin rash, fevers, chills, night sweats, weight loss, swollen lymph nodes,joint swelling, chest pain, shortness of breath, mood changes.  Positive headaches, abdominal pain, body aches, muscle aches also having increasing constipation and is different than last exam  Objective  Blood pressure 140/88, pulse 92, weight 147 lb (66.7 kg).   Systems examined below as of 01/07/17 General: NAD A&O x3 mood, affect normal  HEENT: Pupils equal, extraocular movements intact no nystagmus Respiratory: not short of breath at rest or with speaking Cardiovascular: No lower extremity edema, non tender Skin: Warm dry intact with no signs of infection or rash on extremities or on axial skeleton. Abdomen: Soft nontender, no masses Neuro: Cranial nerves  intact, neurovascularly intact in all extremities with 2+ DTRs and 2+ pulses. Lymph: No lymphadenopathy appreciated today  Gait normal with good balance and coordination.  MSK: Diffusely tender with full range of motion and good stability and symmetric strength and tone of shoulders, elbows, wrist,  knee hips and ankles bilaterallyArthritic changes of multiple joints multiple areas bilaterally tender than it does consistent with patient's fibromyalgiaMore pain over the medial aspect of the knees in usual. .    Impression and Recommendations:     This case required medical decision making of moderate complexity.      Note: This dictation was prepared with  Dragon dictation along with smaller phrase technology. Any transcriptional errors that result from this process are unintentional.

## 2017-01-07 NOTE — Assessment & Plan Note (Signed)
Spent  25 minutes with patient face-to-face and had greater than 50% of counseling including as described in assessment and plan. Discussed that very difficult overall. Discussed maybe some of the other medicines. Continue the synthroid  RTC in 8 weeks.

## 2017-01-07 NOTE — Patient Instructions (Signed)
Good to see you  I would watch the knee pain a little longer  I would do DHEA 50 mg daily for 4 weeks and then 2 weeks off and repeat if it helps.  Continue the same dose of the thyroid medicine.  We should recheck in 2 months.  See me again in 8 months but all I got left is changing the thyroid med again or injections.

## 2017-01-14 ENCOUNTER — Ambulatory Visit (INDEPENDENT_AMBULATORY_CARE_PROVIDER_SITE_OTHER): Payer: Medicare Other | Admitting: Podiatry

## 2017-01-14 ENCOUNTER — Encounter: Payer: Self-pay | Admitting: Podiatry

## 2017-01-14 ENCOUNTER — Ambulatory Visit (INDEPENDENT_AMBULATORY_CARE_PROVIDER_SITE_OTHER): Payer: Medicare Other

## 2017-01-14 DIAGNOSIS — M2042 Other hammer toe(s) (acquired), left foot: Secondary | ICD-10-CM

## 2017-01-15 NOTE — Progress Notes (Signed)
Subjective:    Patient ID: Laura Barnes, female   DOB: 76 y.o.   MRN: [id number]   HPI patient states she's doing well after forefoot reconstruction left with continued persistent swelling if she's on her foot to long. Surgery approximately 2 months    ROS      Objective:  Physical Exam neurovascular was found to be intact negative Homans sign was noted with wound edges that are well coapted digits and good alignment first MPJ and fifth MPJ and good alignment with good range of motion. There is mild edema normal for the 8 week point     Assessment:    Doing well post forefoot reconstruction left     Plan:    X-rays were reviewed with patient and at this point I recommended continued elevation light compression and gradual return soft shoe gear. Encouraged range of motion and reappoint for reevaluation in 1 month by Dr. Milinda Pointer  X-rays indicate that the osteotomy is healing well with screw in place and the digits are in good alignment with screws in place

## 2017-01-19 ENCOUNTER — Other Ambulatory Visit: Payer: Self-pay | Admitting: Internal Medicine

## 2017-01-19 NOTE — Telephone Encounter (Signed)
Pls advise if ok to refill...Laura Barnes

## 2017-01-28 ENCOUNTER — Ambulatory Visit (INDEPENDENT_AMBULATORY_CARE_PROVIDER_SITE_OTHER): Payer: Medicare Other | Admitting: Gastroenterology

## 2017-01-28 ENCOUNTER — Encounter: Payer: Self-pay | Admitting: Gastroenterology

## 2017-01-28 VITALS — BP 130/76 | HR 96 | Ht 60.0 in | Wt 145.0 lb

## 2017-01-28 DIAGNOSIS — K219 Gastro-esophageal reflux disease without esophagitis: Secondary | ICD-10-CM

## 2017-01-28 DIAGNOSIS — R1314 Dysphagia, pharyngoesophageal phase: Secondary | ICD-10-CM | POA: Diagnosis not present

## 2017-01-28 NOTE — Patient Instructions (Signed)
If you are age 76 or older, your body mass index should be between 23-30. Your Body mass index is 28.32 kg/m. If this is out of the aforementioned range listed, please consider follow up with your Primary Care Provider.  If you are age 33 or younger, your body mass index should be between 19-25. Your Body mass index is 28.32 kg/m. If this is out of the aformentioned range listed, please consider follow up with your Primary Care Provider.   You have been scheduled for a colonoscopy. Please follow written instructions given to you at your visit today.  Please pick up your prep supplies at the pharmacy within the next 1-3 days. If you use inhalers (even only as needed), please bring them with you on the day of your procedure. Your physician has requested that you go to www.startemmi.com and enter the access code given to you at your visit today. This web site gives a general overview about your procedure. However, you should still follow specific instructions given to you by our office regarding your preparation for the procedure.  Thank you for choosing  GI  Dr Wilfrid Lund III

## 2017-01-28 NOTE — Progress Notes (Signed)
[facility] Consult Note:  History: Laura Barnes 01/28/2017  Referring physician: Binnie Rail, MD  Reason for consult/chief complaint: Dysphagia (pt reports that medications feel like rocks going down and then feel like they get stuck; gets choked on solids and liquids)   Subjective  HPI:  This is a 76 year old woman referred by primary care noted above for dysphagia. She last saw Dr. Sharlett Iles in 2014, having been a patient of his for over a decade. She had many years of chronic upper abdominal pain with intermittent nausea. Upper endoscopy on at least one occasion showed a small amount of retained food and some pills, leading to concern of gastroparesis. Gastric empty study was normal as described below. Over the last year she is bothered by increasing difficulty swallowing pills, some food and even water will feel stuck in the upper chest. She has some intermittent pyrosis including nocturnal symptoms. She was taking Nexium twice daily, but has been slowly weaning it down until last week when she stopped altogether. There is been no change in symptoms getting off that medicine. She does describe having a knot in her throat that would get worse with stress ever since childhood.  ROS:  Review of Systems  Constitutional: Negative for appetite change and unexpected weight change.  HENT: Negative for mouth sores and voice change.   Eyes: Negative for pain and redness.  Respiratory: Negative for cough and shortness of breath.   Cardiovascular: Negative for chest pain and palpitations.  Genitourinary: Negative for dysuria and hematuria.  Musculoskeletal: Negative for arthralgias and myalgias.  Skin: Negative for pallor and rash.  Neurological: Positive for headaches. Negative for weakness.  Hematological: Negative for adenopathy.  Psychiatric/Behavioral: Positive for dysphoric mood. The patient is nervous/anxious.      Past Medical History: Past  Medical History:  Diagnosis Date  . ALLERGIC RHINITIS   . Anemia   . ANXIETY   . Bronchitis   . Complication of anesthesia    says one time waking up she couldn't breathe, felt like her throat closing up  . DEPRESSION   . Enthesopathy of hip region   . Family history of anesthesia complication    sister with n/v  . FOOT PAIN, BILATERAL   . GERD   . Hiatal hernia   . HYPERLIPIDEMIA   . Hypothyroidism   . IBS   . MIGRAINE HEADACHE   . MIGRAINE, COMMON   . NECK MASS   . OSTEOPENIA   . OSTEOPOROSIS   . PLANTAR FASCIITIS   . RASH-NONVESICULAR   . SINUSITIS- ACUTE-NOS   . SKIN LESION   . VAGINITIS   . VENOUS INSUFFICIENCY, CHRONIC      Past Surgical History: Past Surgical History:  Procedure Laterality Date  . APPENDECTOMY    . BREAST ENHANCEMENT SURGERY    . BREAST IMPLANT REMOVAL    . CHOLECYSTECTOMY    . COLONOSCOPY    . MASTECTOMY     bilateral for severe bilateral fibrocystic disease  . OOPHORECTOMY    . s/p neck lump removal    . SHOULDER SURGERY    . SINUS ENDO W/FUSION Right 06/30/2013   Procedure: RIGHT ENDOSCOPIC SPHENOIDECTOMY WITH FUSION SCAN;  Surgeon: [name], MD;  Location: [facility];  Service: ENT;  Laterality: Right;  . TONSILLECTOMY    . VAGINAL HYSTERECTOMY       Family History: Family History  Problem Relation Age of Onset  . Diabetes Mother   . Heart disease Father   .  Diabetes Sister   . Breast cancer Maternal Grandmother   . Heart disease Maternal Uncle        x 2  . Diabetes Other        multi relatives on father side  . Heart disease Maternal Aunt   . Kidney disease Paternal Uncle        questionable  . Colon cancer Neg Hx   . Colon polyps Neg Hx   . Rectal cancer Neg Hx   . Stomach cancer Neg Hx     Social History: Social History   Social History  . Marital status: Married    Spouse name: N/A  . Number of children: 2  . Years of education: N/A   Occupational History  . retired Press photographer    Social History Main  Topics  . Smoking status: Never Smoker  . Smokeless tobacco: Never Used  . Alcohol use No  . Drug use: No  . Sexual activity: Not Asked   Other Topics Concern  . None   Social History Narrative   Has 2 biological children and 1 step child    Allergies: Allergies  Allergen Reactions  . Prednisone Anaphylaxis    Patient unable to remember why she needed to steroid shot but just knows that when she got back to work she started having a lot of trouble breathing.  (jkl 05/04/14)  . Aspirin Other (See Comments)    Stomach ache and bleed  . Erythromycin Diarrhea  . Latex Other (See Comments)    blisters  . Levofloxacin Other (See Comments)    Headaches, GI upset  . Nsaids Other (See Comments)    stomach bleeding per pt  . Statins Nausea Only and Other (See Comments)    Headache, upset stomach, joint hurt, heartburn  . Sumatriptan Hives and Palpitations  . Adhesive [Tape] Other (See Comments)    blisters  . Amoxicillin-Pot Clavulanate   . Doxycycline   . Ivp Dye [Iodinated Diagnostic Agents]     If made with blue shellfish then CAN NOT have this type of dye.   . Methylphenidate Hcl   . Mirtazapine   . Shellfish Allergy     Blue shellfish  . Hydrocodone Itching  . Hydrocodone-Acetaminophen Itching  . Rofecoxib Nausea Only  . Sertraline Hcl Nausea Only  . Sulfonamide Derivatives Itching    Outpatient Meds: Current Outpatient Prescriptions  Medication Sig Dispense Refill  . ALPRAZolam (XANAX) 0.5 MG tablet TAKE 1 TABLET BY MOUTH THREE TIMES DAILY AS NEEDED FOR ANXIETY 90 tablet 1  . Amino Acids (AMINO ACID PO) Take 1 capsule by mouth 2 (two) times daily.    . B Complex-Biotin-FA (B COMPLETE) TABS Take 1 tablet by mouth daily. B Complete 100mg     . benzonatate (TESSALON) 200 MG capsule TAKE 1 CAPSULE(200 MG) BY MOUTH THREE TIMES DAILY AS NEEDED FOR COUGH 90 capsule 0  . buPROPion (WELLBUTRIN XL) 300 MG 24 hr tablet TAKE 1 TABLET BY MOUTH DAILY 90 tablet 1  . cetirizine  (ZYRTEC) 10 MG tablet TAKE 1 TABLET (10 MG TOTAL) BY MOUTH DAILY. 30 tablet 11  . cholecalciferol (VITAMIN D) 1000 UNITS tablet Take 1,000 Units by mouth daily.    . Coenzyme Q10 (CO Q-10) 200 MG CAPS Take 1 capsule by mouth 2 (two) times daily.    Marland Kitchen esomeprazole (NEXIUM) 20 MG capsule Take 20 mg by mouth daily at 12 noon.    Marland Kitchen estradiol (ESTRACE) 1 MG tablet TAKE 1 TABLET BY MOUTH  EVERY DAY 90 tablet 0  . FLUoxetine (PROZAC) 20 MG capsule TAKE 1 CAPSULE BY MOUTH EVERY DAY 90 capsule 1  . HYDROmorphone (DILAUDID) 4 MG tablet Take 1 tablet (4 mg total) by mouth every 4 (four) hours as needed for severe pain. 30 tablet 0  . levothyroxine (SYNTHROID, LEVOTHROID) 75 MCG tablet TAKE 1 TABLET BY MOUTH EVERY MORNING 30 tablet 1  . Multiple Vitamin (MULTIVITAMIN WITH MINERALS) TABS tablet Take 1 tablet by mouth daily.    . Omega-3 Fatty Acids (SALMON OIL-1000 PO) Take 1,000 mg by mouth daily.    . ondansetron (ZOFRAN) 4 MG tablet Take 1 tablet (4 mg total) by mouth every 8 (eight) hours as needed. 20 tablet 0  . Potassium 99 MG TABS Take 99 mg by mouth daily.    Marland Kitchen triamcinolone cream (KENALOG) 0.5 % Apply topically 2 (two) times daily. As needed for rash 30 g 0  . triamterene-hydrochlorothiazide (MAXZIDE-25) 37.5-25 MG tablet TAKE 1 TABLET BY MOUTH EVERY DAY 90 tablet 3  . TURMERIC PO Take by mouth.     No current facility-administered medications for this visit.       ___________________________________________________________________ Objective   Exam:  BP 130/76   Pulse 96   Ht 5' (1.524 m)   Wt 145 lb (65.8 kg)   BMI 28.32 kg/m    General: this is a(n) Well-appearing woman, husband present for entire encounter,  Normal vocal quality   Eyes: sclera anicteric, no redness  ENT: oral mucosa moist without lesions, no cervical or supraclavicular lymphadenopathy, good dentition, no thyromegaly  CV: RRR without murmur, S1/S2, no JVD, no peripheral edema  Resp: clear to auscultation  bilaterally, normal RR and effort noted  GI: soft, no tenderness, with active bowel sounds. No guarding or palpable organomegaly noted.  Skin; warm and dry, no rash or jaundice noted  Neuro: awake, alert and oriented x 3. Normal gross motor function and fluent speech   Radiologic Studies:  09/2012 GES , EGD 2014 no stricutre 2013 EGD  Schatzki ring  Assessment: Encounter Diagnoses  Name Primary?  . Pharyngoesophageal dysphagia Yes  . Gastroesophageal reflux disease without esophagitis     It is difficult to tell if she has a mechanical cause such as a stricture or web or cricopharyngeal dysfunction.  Plan:  EGD with possible dilation. May need barium imaging or MBS to follow.  Thank you for the courtesy of this consult.  Please call me with any questions or concerns.  Nelida Meuse III  CC: Binnie Rail, MD

## 2017-02-04 ENCOUNTER — Encounter: Payer: Self-pay | Admitting: Gastroenterology

## 2017-02-11 ENCOUNTER — Encounter: Payer: Self-pay | Admitting: Podiatry

## 2017-02-11 ENCOUNTER — Ambulatory Visit (INDEPENDENT_AMBULATORY_CARE_PROVIDER_SITE_OTHER): Payer: Medicare Other

## 2017-02-11 ENCOUNTER — Ambulatory Visit (INDEPENDENT_AMBULATORY_CARE_PROVIDER_SITE_OTHER): Payer: Medicare Other | Admitting: Podiatry

## 2017-02-11 DIAGNOSIS — M21622 Bunionette of left foot: Secondary | ICD-10-CM

## 2017-02-11 DIAGNOSIS — M2042 Other hammer toe(s) (acquired), left foot: Secondary | ICD-10-CM | POA: Diagnosis not present

## 2017-02-11 DIAGNOSIS — M2012 Hallux valgus (acquired), left foot: Secondary | ICD-10-CM

## 2017-02-11 NOTE — Progress Notes (Signed)
She presents today postop visit date of surgery 11/06/2016 status post [name] bunionectomy metatarsal osteotomy second and fifth left hammertoe repair #2 #3 and #4 states that is sore and it still swells.  Objective: Vital signs are stable she is alert and oriented 3 presents in a pair of loosefitting sandals very thin sole today. She states that she's been wearing issues like this through the summer. Pulses are palpable foot is mildly edematous around the fifth metatarsal base and fifth metatarsal head. She was great range of motion of the toes in all the toes sit in rectus and her perfect alignment. Radiographs 3 views of the left foot taken today in the office demonstrates osseous maturing individual with internal fixation to toes #2 #3 and #4 of the left foot with 2 screws to the fifth metatarsal of the left foot. This appears to be in good alignment and internal fixation is in good position.  Assessment: Well-healing surgical foot mild edema most likely secondary to shoe gear and over performance.  Plan: I encouraged her to continue ambulating but not barefooted and not in sandals I recommended new balance tennis shoes or [facility].

## 2017-02-16 ENCOUNTER — Other Ambulatory Visit: Payer: Self-pay | Admitting: Family Medicine

## 2017-02-16 NOTE — Telephone Encounter (Signed)
Refill done.  

## 2017-02-18 ENCOUNTER — Encounter: Payer: Self-pay | Admitting: Gastroenterology

## 2017-02-18 ENCOUNTER — Ambulatory Visit (AMBULATORY_SURGERY_CENTER): Payer: Medicare Other | Admitting: Gastroenterology

## 2017-02-18 VITALS — BP 133/70 | HR 75 | Temp 98.0°F | Resp 19 | Ht 60.0 in | Wt 145.0 lb

## 2017-02-18 DIAGNOSIS — R1310 Dysphagia, unspecified: Secondary | ICD-10-CM | POA: Diagnosis not present

## 2017-02-18 DIAGNOSIS — R1313 Dysphagia, pharyngeal phase: Secondary | ICD-10-CM

## 2017-02-18 MED ORDER — SODIUM CHLORIDE 0.9 % IV SOLN: 500.0000 mL | INTRAVENOUS | Status: DC

## 2017-02-18 NOTE — Op Note (Signed)
[name]: Laura Barnes Procedure Date: 02/18/2017 3:32 PM MRN: [id number] Endoscopist: Mallie Mussel L. Loletha Carrow , MD Age: 76 Referring MD:  Date of Birth: 07/23/1940 Gender: Female Account #: [id number] Procedure:                Upper GI endoscopy Indications:              Dysphagia, Heartburn Medicines:                Monitored Anesthesia Care Procedure:                Pre-Anesthesia Assessment:                           - Prior to the procedure, a History and Physical                            was performed, and patient medications and                            allergies were reviewed. The patient's tolerance of                            previous anesthesia was also reviewed. The risks                            and benefits of the procedure and the sedation                            options and risks were discussed with the patient.                            All questions were answered, and informed consent                            was obtained. Prior Anticoagulants: The patient has                            taken no previous anticoagulant or antiplatelet                            agents. ASA Grade Assessment: II - A patient with                            mild systemic disease. After reviewing the risks                            and benefits, the patient was deemed in                            satisfactory condition to undergo the procedure.                           After obtaining informed consent, the endoscope was  passed under direct vision. Throughout the                            procedure, the patient's blood pressure, pulse, and                            oxygen saturations were monitored continuously. The                            Endoscope was introduced through the mouth, and                            advanced to the second part of duodenum. The upper                            GI endoscopy was accomplished  without difficulty.                            The patient tolerated the procedure well. Scope In: Scope Out: Findings:                 The larynx was normal.                           The esophagus was normal. The scope was withdrawn.                            Dilation was performed with a Maloney dilator with                            no resistance at 38 Fr.                           The stomach was normal.                           The cardia and gastric fundus were normal on                            retroflexion.                           The examined duodenum was normal. Complications:            No immediate complications. Estimated Blood Loss:     Estimated blood loss: none. Impression:               - Normal larynx.                           - Normal esophagus. Dilated.                           - Normal stomach.                           - Normal examined duodenum.                           -  No specimens collected.                           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 Recommendation:           - Patient has a contact number available for                            emergencies. The signs and symptoms of potential                            delayed complications were discussed with the                            patient. Return to normal activities tomorrow.                            Written discharge instructions were provided to the                            patient.                           - Resume previous diet.                           - Continue present medications. Patient was                            encouraged to resume her acid suppression                            medication. Ann-Marie Kluge L. Loletha Carrow, MD 02/18/2017 4:06:15 PM This report has been signed electronically.

## 2017-02-18 NOTE — Patient Instructions (Signed)
YOU HAD AN ENDOSCOPIC PROCEDURE TODAY: Refer to the procedure report and other information in the discharge instructions given to you for any specific questions about what was found during the examination. If this information does not answer your questions, please call [place] office at [phone] to clarify.   YOU SHOULD EXPECT: Some feelings of bloating in the abdomen. Passage of more gas than usual. Walking can help get rid of the air that was put into your GI tract during the procedure and reduce the bloating. If you had a lower endoscopy (such as a colonoscopy or flexible sigmoidoscopy) you may notice spotting of blood in your stool or on the toilet paper. Some abdominal soreness may be present for a day or two, also.  DIET: Your first meal following the procedure should be a light meal and then it is ok to progress to your normal diet. A half-sandwich or bowl of soup is an example of a good first meal. Heavy or fried foods are harder to digest and may make you feel nauseous or bloated. Drink plenty of fluids but you should avoid alcoholic beverages for 24 hours. If you had a esophageal dilation, please see attached instructions for diet.    ACTIVITY: Your care partner should take you home directly after the procedure. You should plan to take it easy, moving slowly for the rest of the day. You can resume normal activity the day after the procedure however YOU SHOULD NOT DRIVE, use power tools, machinery or perform tasks that involve climbing or major physical exertion for 24 hours (because of the sedation medicines used during the test).   SYMPTOMS TO REPORT IMMEDIATELY: A gastroenterologist can be reached at any hour. Please call [phone]  for any of the following symptoms:   Following upper endoscopy (EGD, EUS, ERCP, esophageal dilation) Vomiting of blood or coffee ground material  New, significant abdominal pain  New, significant chest pain or pain under the shoulder blades  Painful or  persistently difficult swallowing  New shortness of breath  Black, tarry-looking or red, bloody stools  FOLLOW UP:  If any biopsies were taken you will be contacted by phone or by letter within the next 1-3 weeks. Call [phone]  if you have not heard about the biopsies in 3 weeks.  Please also call with any specific questions about appointments or follow up tests.  

## 2017-02-18 NOTE — Progress Notes (Signed)
Called to room to assist during endoscopic procedure.  Patient ID and intended procedure confirmed with present staff. Received instructions for my participation in the procedure from the performing physician.  

## 2017-02-19 ENCOUNTER — Telehealth: Payer: Self-pay | Admitting: *Deleted

## 2017-02-19 NOTE — Telephone Encounter (Signed)
  Follow up Call-  Call back number 02/18/2017  Post procedure Call Back phone  # [phone]  Permission to leave phone message Yes  Some recent data might be hidden     Patient questions:  Do you have a fever, pain , or abdominal swelling? No. Pain Score  0 *  Have you tolerated food without any problems? Yes.    Have you been able to return to your normal activities? Yes.    Do you have any questions about your discharge instructions: Diet   No. Medications  No. Follow up visit  No.  Do you have questions or concerns about your Care? No.  Actions: * If pain score is 4 or above: No action needed, pain <4.

## 2017-02-23 ENCOUNTER — Other Ambulatory Visit: Payer: Self-pay | Admitting: Internal Medicine

## 2017-02-23 DIAGNOSIS — F419 Anxiety disorder, unspecified: Secondary | ICD-10-CM

## 2017-02-23 NOTE — Telephone Encounter (Signed)
Controlled Substance Database checked. Last filled on 01/13/17 

## 2017-02-24 NOTE — Telephone Encounter (Signed)
RX faxed to [facility] 

## 2017-03-03 DIAGNOSIS — H2513 Age-related nuclear cataract, bilateral: Secondary | ICD-10-CM | POA: Diagnosis not present

## 2017-03-03 DIAGNOSIS — H524 Presbyopia: Secondary | ICD-10-CM | POA: Diagnosis not present

## 2017-04-01 ENCOUNTER — Ambulatory Visit (INDEPENDENT_AMBULATORY_CARE_PROVIDER_SITE_OTHER): Payer: Medicare Other | Admitting: Family Medicine

## 2017-04-01 ENCOUNTER — Encounter: Payer: Self-pay | Admitting: Family Medicine

## 2017-04-01 ENCOUNTER — Other Ambulatory Visit (INDEPENDENT_AMBULATORY_CARE_PROVIDER_SITE_OTHER): Payer: Medicare Other

## 2017-04-01 VITALS — BP 118/84 | HR 98 | Ht 60.0 in | Wt 146.0 lb

## 2017-04-01 DIAGNOSIS — M2550 Pain in unspecified joint: Secondary | ICD-10-CM | POA: Diagnosis not present

## 2017-04-01 DIAGNOSIS — E039 Hypothyroidism, unspecified: Secondary | ICD-10-CM | POA: Diagnosis not present

## 2017-04-01 DIAGNOSIS — G894 Chronic pain syndrome: Secondary | ICD-10-CM

## 2017-04-01 DIAGNOSIS — Z23 Encounter for immunization: Secondary | ICD-10-CM | POA: Diagnosis not present

## 2017-04-01 LAB — URIC ACID: Uric Acid, Serum: 3.1 mg/dL (ref 2.4–7.0)

## 2017-04-01 LAB — SEDIMENTATION RATE: Sed Rate: 59 mm/hr — ABNORMAL HIGH (ref 0–30)

## 2017-04-01 LAB — IBC PANEL
Iron: 38 ug/dL — ABNORMAL LOW (ref 42–145)
Saturation Ratios: 12.6 % — ABNORMAL LOW (ref 20.0–50.0)
Transferrin: 216 mg/dL (ref 212.0–360.0)

## 2017-04-01 LAB — CORTISOL: Cortisol, Plasma: 12 ug/dL

## 2017-04-01 LAB — TSH: TSH: 0.55 u[IU]/mL (ref 0.35–4.50)

## 2017-04-01 MED ORDER — METHYLPREDNISOLONE ACETATE 80 MG/ML IJ SUSP
80.0000 mg | Freq: Once | INTRAMUSCULAR | Status: AC
  Administered 2017-04-01: 80 mg via INTRAMUSCULAR

## 2017-04-01 MED ORDER — KETOROLAC TROMETHAMINE 60 MG/2ML IM SOLN
60.0000 mg | Freq: Once | INTRAMUSCULAR | Status: AC
  Administered 2017-04-01: 60 mg via INTRAMUSCULAR

## 2017-04-01 NOTE — Patient Instructions (Signed)
Good to see you  Lets get labs  2 injections today  Flu shot today  Stay active.  See me again in 8-12 weeks.

## 2017-04-01 NOTE — Progress Notes (Signed)
[facility] [address] [place], [place] [address] Phone: [phone] Subjective:    I'm seeing this patient by the request  of:    CC: Oliver pain  YQI:[id number]  Laura Barnes is a 76 y.o. female coming in for follow up for back pain. She notes that the pressure points in the back radiate down into the leg. This has been occurring for the past 3 weeks.  Patient does have fibromyalgia and feels like this is more of a flare. Radiation to both knees. Patient states that the pain is severe States that it is affecting all daily activities. Patient states since even difficult to follow sleep at night. Feels like this is one of the worst fibromyalgia flare she has had. We have made some changes in her thyroid previously that was helping. We have not checked her thyroid quite some time.      Past Medical History:  Diagnosis Date  . ALLERGIC RHINITIS   . Allergy   . Anemia   . ANXIETY   . Arthritis    hands  . Bronchitis   . Chronic fatigue fibromyalgia syndrome   . Complication of anesthesia    says one time waking up she couldn't breathe, felt like her throat closing up  . DEPRESSION   . Enthesopathy of hip region   . Family history of anesthesia complication    sister with n/v  . FOOT PAIN, BILATERAL   . GERD   . Hiatal hernia   . HYPERLIPIDEMIA   . Hypothyroidism   . IBS   . MIGRAINE HEADACHE   . MIGRAINE, COMMON   . NECK MASS   . OSTEOPENIA   . OSTEOPOROSIS   . PLANTAR FASCIITIS   . RASH-NONVESICULAR   . SINUSITIS- ACUTE-NOS   . SKIN LESION   . VAGINITIS   . VENOUS INSUFFICIENCY, CHRONIC    Past Surgical History:  Procedure Laterality Date  . APPENDECTOMY    . BREAST ENHANCEMENT SURGERY    . BREAST IMPLANT REMOVAL    . CHOLECYSTECTOMY    . COLONOSCOPY    . FOOT SURGERY  11/06/2016  . MASTECTOMY     bilateral for severe bilateral fibrocystic disease  . OOPHORECTOMY    . s/p neck lump removal    . SHOULDER SURGERY    .  SINUS ENDO W/FUSION Right 06/30/2013   Procedure: RIGHT ENDOSCOPIC SPHENOIDECTOMY WITH FUSION SCAN;  Surgeon: [name], MD;  Location: [facility];  Service: ENT;  Laterality: Right;  . TONSILLECTOMY    . VAGINAL HYSTERECTOMY     Social History   Social History  . Marital status: Married    Spouse name: N/A  . Number of children: 2  . Years of education: N/A   Occupational History  . retired Press photographer    Social History Main Topics  . Smoking status: Never Smoker  . Smokeless tobacco: Never Used  . Alcohol use No  . Drug use: No  . Sexual activity: Not Asked   Other Topics Concern  . None   Social History Narrative   Has 2 biological children and 1 step child   Allergies  Allergen Reactions  . Prednisone Anaphylaxis    Patient unable to remember why she needed to steroid shot but just knows that when she got back to work she started having a lot of trouble breathing.  (jkl 05/04/14)  . Aspirin Other (See Comments)    Stomach ache and bleed  .  Erythromycin Diarrhea  . Latex Other (See Comments)    blisters  . Levofloxacin Other (See Comments)    Headaches, GI upset  . Nsaids Other (See Comments)    stomach bleeding per pt  . Statins Nausea Only and Other (See Comments)    Headache, upset stomach, joint hurt, heartburn  . Sumatriptan Hives and Palpitations  . Adhesive [Tape] Other (See Comments)    blisters  . Amoxicillin-Pot Clavulanate   . Doxycycline   . Ivp Dye [Iodinated Diagnostic Agents]     If made with blue shellfish then CAN NOT have this type of dye.   . Methylphenidate Hcl   . Mirtazapine   . Shellfish Allergy     Blue shellfish  . Hydrocodone Itching  . Hydrocodone-Acetaminophen Itching  . Rofecoxib Nausea Only  . Sertraline Hcl Nausea Only  . Sulfonamide Derivatives Itching   Family History  Problem Relation Age of Onset  . Diabetes Mother   . Heart disease Father   . Diabetes Sister   . Breast cancer Maternal Grandmother   . Heart disease  Maternal Uncle        x 2  . Diabetes Other        multi relatives on father side  . Heart disease Maternal Aunt   . Kidney disease Paternal Uncle        questionable  . Colon cancer Neg Hx   . Colon polyps Neg Hx   . Rectal cancer Neg Hx   . Stomach cancer Neg Hx      Past medical history, social, surgical and family history all reviewed in electronic medical record.  No pertanent information unless stated regarding to the chief complaint.   Review of Systems:Review of systems updated and as accurate as of 04/01/17  Positive headache,  dizziness, abdominal pain, skin rash, fevers, chills, night sweats, weight loss, swollen lymph nodes, body aches, joint swelling, muscle aches,, shortness of breath, mood changes. Negative visual changes, nausea, vomiting, diarrhea, constipation, skin rash, fever, chills, night sweats, weight loss, swollen nodules, chest pain  Objective  Blood pressure 118/84, pulse 98, height 5' (1.524 m), weight 146 lb (66.2 kg), SpO2 94 %. Systems examined below as of 04/01/17   General: No apparent distress alert and oriented x3 mood and affect normal, dressed appropriately.  HEENT: Pupils equal, extraocular movements intact  Respiratory: Patient's speak in full sentences and does not appear short of breath  Cardiovascular: 1+ lower extremity edema, non tender, no erythema  Skin: Warm dry intact with no signs of infection or rash on extremities or on axial skeleton.  Abdomen: Soft Tender Neuro: Cranial nerves II through XII are intact, neurovascularly intact in all extremities with 2+ DTRs and 2+ pulses.  Lymph: No lymphadenopathy of posterior or anterior cervical chain or axillae bilaterally.  Gait Antalgic MSK:  Severe re tender to palpation even to light sensation with full range of motion and good stability and symmetric strength and tone of shoulders, elbows, wrist, hip, knee and ankles bilaterally significant arthritic changes of multiple joints. Unable to do  significantly more testing secondary to patient's discomfort..     Impression and Recommendations:     This case required medical decision making of moderate complexity.      Note: This dictation was prepared with Dragon dictation along with smaller phrase technology. Any transcriptional errors that result from this process are unintentional.

## 2017-04-01 NOTE — Assessment & Plan Note (Signed)
Could be contribute in. Pretest thyroid. We'll also check adrenal glands make sure nothing else is contribute in. Discussed inflammation as well to make sure no autoimmune diseases.

## 2017-04-01 NOTE — Assessment & Plan Note (Signed)
Patient has significant chronic pain with likely fibromyalgia. Likely have an exacerbation. Toradol and Depo-Medrol given today. Patient has significant allergies to multiple different medications. Patient has been to pain management and has discontinued it. Discussed with her will not do any type of narcotics and her. Patient can do other injections if necessary in the long run. Patient feels though that this seems to be more beneficial. Laboratory workup as above. Follow-up again in 6-8 weeks.

## 2017-04-04 LAB — T3: T3, Total: 107 ng/dL (ref 76–181)

## 2017-04-04 LAB — ANA: Anti Nuclear Antibody(ANA): NEGATIVE

## 2017-04-04 LAB — RHEUMATOID FACTOR: Rhuematoid fact SerPl-aCnc: <14 IU/mL (ref <14)

## 2017-04-04 LAB — CALCIUM, IONIZED: Calcium, Ion: 5.2 mg/dL (ref 4.8–5.6)

## 2017-04-04 LAB — PTH, INTACT AND CALCIUM
Calcium: 9 mg/dL (ref 8.6–10.4)
PTH: 39 pg/mL (ref 14–64)

## 2017-04-04 LAB — DHEA: DHEA: 47 ng/dL — ABNORMAL LOW (ref 102–1185)

## 2017-04-04 LAB — T4: T4, Total: 9.6 ug/dL (ref 5.1–11.9)

## 2017-04-06 ENCOUNTER — Telehealth: Payer: Self-pay

## 2017-04-06 NOTE — Progress Notes (Signed)
Contacted patient. She says the injection worked fine. She explained to me that she was sitting under an air vent and that she is in pain again and is feeling much worse. She was wondering if you could call and tell her what she can do for the pain.

## 2017-04-06 NOTE — Telephone Encounter (Signed)
-----   Message from Lyndal Pulley, DO sent at 04/05/2017  6:40 AM EST ----- Can we call patient and tell her labs look good.  DHEA is low and could be some trouble with her adrenal glands.  Would havee her take DHEA 50 mg daily for 4 weeks.

## 2017-04-12 ENCOUNTER — Other Ambulatory Visit: Payer: Self-pay | Admitting: Internal Medicine

## 2017-04-15 ENCOUNTER — Telehealth: Payer: Self-pay | Admitting: Internal Medicine

## 2017-04-15 ENCOUNTER — Ambulatory Visit (INDEPENDENT_AMBULATORY_CARE_PROVIDER_SITE_OTHER): Payer: Medicare Other | Admitting: Nurse Practitioner

## 2017-04-15 ENCOUNTER — Telehealth: Payer: Self-pay | Admitting: Family Medicine

## 2017-04-15 ENCOUNTER — Encounter: Payer: Self-pay | Admitting: Nurse Practitioner

## 2017-04-15 VITALS — BP 134/86 | HR 97 | Temp 97.6°F | Ht 60.0 in | Wt 140.0 lb

## 2017-04-15 DIAGNOSIS — J00 Acute nasopharyngitis [common cold]: Secondary | ICD-10-CM

## 2017-04-15 MED ORDER — HYDROMORPHONE HCL 4 MG PO TABS: 4.0000 mg | ORAL_TABLET | ORAL | 0 refills | Status: DC | PRN

## 2017-04-15 NOTE — Telephone Encounter (Signed)
Printed -- resent electronically.

## 2017-04-15 NOTE — Telephone Encounter (Signed)
Spoke with patient. She is scheduled for an appointment with [place] on Monday.

## 2017-04-15 NOTE — Telephone Encounter (Signed)
Pt request to speak to the assistant. She said she had injection and after she had the injection she was sitting under an air vent that's cause her to have the pain again. She wants to know if she can get another injection. Please call pt

## 2017-04-15 NOTE — Patient Instructions (Addendum)
Call office if no improvement by Monday. She declined any prescription for symptoms at this time. States she will rather take OTC supplements (Vitamin C, B, E, and D)  URI Instructions: Encourage adequate oral hydration.  Use over-the-counter  "cold" medicines  such as "Tylenol cold" , "Advil cold",  "Mucinex" or" Mucinex D"  for cough and congestion.  Avoid decongestants if you have high blood pressure. Use" Delsym" or" Robitussin" cough syrup varietis for cough.  You can use plain "Tylenol" or "Advi"l for fever, chills and achyness.   "Common cold" symptoms are usually triggered by a virus.  The antibiotics are usually not necessary. On average, a" viral cold" illness would take 4-7 days to resolve. Please, make an appointment if you are not better or if you're worse.

## 2017-04-15 NOTE — Telephone Encounter (Signed)
[place] Controlled Substance Database checked. Last filled on 12/31/16

## 2017-04-15 NOTE — Telephone Encounter (Signed)
Pt request refill for Dilaudid. Please advise.

## 2017-04-15 NOTE — Progress Notes (Signed)
Subjective:  Patient ID: Laura Barnes, female    DOB: Feb 12, 1941  Age: 76 y.o. MRN: [id number]  CC: Sinusitis (sore throat,sneez,drainage,headahce,yellow mucus when spit up/ -- 3 days///spouse in hospice)   URI   This is a new problem. The current episode started in the past 7 days. The problem has been unchanged. There has been no fever. Associated symptoms include congestion, coughing, a plugged ear sensation, rhinorrhea, sinus pain, sneezing, a sore throat and swollen glands. Treatments tried: mucinex. The treatment provided no relief.    congestion for 3days Clear nasal drainage. No imorovement with mucinex.  Outpatient Medications Prior to Visit  Medication Sig Dispense Refill  . ALPRAZolam (XANAX) 0.5 MG tablet TAKE 1 TABLET BY MOUTH THREE TIMES DAILY AS NEEDED FOR ANXIETY 90 tablet 1  . Amino Acids (AMINO ACID PO) Take 1 capsule by mouth 2 (two) times daily.    . B Complex-Biotin-FA (B COMPLETE) TABS Take 1 tablet by mouth daily. B Complete 100mg     . benzonatate (TESSALON) 200 MG capsule TAKE 1 CAPSULE(200 MG) BY MOUTH THREE TIMES DAILY AS NEEDED FOR COUGH 90 capsule 0  . benzonatate (TESSALON) 200 MG capsule TAKE 1 CAPSULE(200 MG) BY MOUTH THREE TIMES DAILY AS NEEDED FOR COUGH 90 capsule 0  . buPROPion (WELLBUTRIN XL) 300 MG 24 hr tablet TAKE 1 TABLET BY MOUTH DAILY 90 tablet 1  . cetirizine (ZYRTEC) 10 MG tablet TAKE 1 TABLET (10 MG TOTAL) BY MOUTH DAILY. 30 tablet 11  . cholecalciferol (VITAMIN D) 1000 UNITS tablet Take 1,000 Units by mouth daily.    . Coenzyme Q10 (CO Q-10) 200 MG CAPS Take 1 capsule by mouth 2 (two) times daily.    Marland Kitchen esomeprazole (NEXIUM) 20 MG capsule Take 20 mg by mouth daily at 12 noon.    Marland Kitchen estradiol (ESTRACE) 1 MG tablet TAKE 1 TABLET BY MOUTH EVERY DAY 90 tablet 0  . FLUoxetine (PROZAC) 20 MG capsule TAKE 1 CAPSULE BY MOUTH EVERY DAY 90 capsule 1  . HYDROmorphone (DILAUDID) 4 MG tablet Take 1 tablet (4 mg total) by mouth every 4 (four) hours as  needed for severe pain. 30 tablet 0  . levothyroxine (SYNTHROID, LEVOTHROID) 75 MCG tablet TAKE 1 TABLET BY MOUTH EVERY DAY IN THE MORNING 90 tablet 0  . Multiple Vitamin (MULTIVITAMIN WITH MINERALS) TABS tablet Take 1 tablet by mouth daily.    . Omega-3 Fatty Acids (SALMON OIL-1000 PO) Take 1,000 mg by mouth daily.    . ondansetron (ZOFRAN) 4 MG tablet Take 1 tablet (4 mg total) by mouth every 8 (eight) hours as needed. 20 tablet 0  . Potassium 99 MG TABS Take 99 mg by mouth daily.    Marland Kitchen triamcinolone cream (KENALOG) 0.5 % Apply topically 2 (two) times daily. As needed for rash 30 g 0  . triamterene-hydrochlorothiazide (MAXZIDE-25) 37.5-25 MG tablet TAKE 1 TABLET BY MOUTH EVERY DAY 90 tablet 3  . TURMERIC PO Take by mouth.     No facility-administered medications prior to visit.     ROS See HPI  Objective:  BP 134/86   Pulse 97   Temp 97.6 F (36.4 C)   Ht 5' (1.524 m)   Wt 140 lb (63.5 kg)   SpO2 99%   BMI 27.34 kg/m   BP Readings from Last 3 Encounters:  04/15/17 134/86  04/01/17 118/84  02/18/17 133/70    Wt Readings from Last 3 Encounters:  04/15/17 140 lb (63.5 kg)  04/01/17 146 lb (66.2  kg)  02/18/17 145 lb (65.8 kg)    Physical Exam  Constitutional: She is oriented to person, place, and time. No distress.  HENT:  Right Ear: Tympanic membrane, external ear and ear canal normal.  Left Ear: Tympanic membrane, external ear and ear canal normal.  Nose: Mucosal edema and rhinorrhea present. Right sinus exhibits no maxillary sinus tenderness and no frontal sinus tenderness. Left sinus exhibits no maxillary sinus tenderness and no frontal sinus tenderness.  Mouth/Throat: Uvula is midline. Posterior oropharyngeal erythema present. No oropharyngeal exudate.  Cardiovascular: Normal rate.  Pulmonary/Chest: Effort normal and breath sounds normal. No respiratory distress. She has no wheezes. She has no rales.  Lymphadenopathy:    She has no cervical adenopathy.    Neurological: She is alert and oriented to person, place, and time.  Vitals reviewed.   Lab Results  Component Value Date   WBC 8.5 09/16/2016   HGB 12.9 09/16/2016   HCT 38.2 09/16/2016   PLT 239.0 09/16/2016   GLUCOSE 85 09/16/2016   CHOL 193 09/16/2016   TRIG 93.0 09/16/2016   HDL 75.70 09/16/2016   LDLDIRECT 119.6 04/06/2013   LDLCALC 99 09/16/2016   ALT 15 09/16/2016   AST 25 09/16/2016   NA 138 09/16/2016   K 3.8 09/16/2016   CL 102 09/16/2016   CREATININE 0.93 09/16/2016   BUN 10 09/16/2016   CO2 30 09/16/2016   TSH 0.55 04/01/2017   HGBA1C 5.3 05/09/2015    Mr Brain Wo Contrast  Result Date: 01/04/2017 CLINICAL DATA:  New onset severe LEFT migraines. Worst headache of life. EXAM: MRI HEAD WITHOUT CONTRAST TECHNIQUE: Multiplanar, multiecho pulse sequences of the brain and surrounding structures were obtained without intravenous contrast. COMPARISON:  MRI of the head November 21, 2011 and MRI of the cervical spine December 26, 2013 FINDINGS: BRAIN: No reduced diffusion to suggest acute ischemia. No susceptibility artifact to suggest hemorrhage. The ventricles and sulci are normal for patient's age. Scattered subcentimeter supratentorial white matter FLAIR T2 hyperintensities are nonspecific and, less than expected for age. No suspicious parenchymal signal, mass or mass effect. No abnormal extra-axial fluid collections. VASCULAR: Normal major intracranial vascular flow voids present at skull base. SKULL AND UPPER CERVICAL SPINE: No abnormal sellar expansion. No suspicious calvarial bone marrow signal. Craniocervical junction maintained. SINUSES/ORBITS: The mastoid air-cells and included paranasal sinuses are well-aerated. The included ocular globes and orbital contents are non-suspicious. OTHER: Minimal of emboli occipital joint effusions, likely inflammatory and present in 2015. IMPRESSION: Normal noncontrast MRI of the head for age. Electronically Signed   By: Elon Alas M.D.    On: 01/04/2017 17:02    Assessment & Plan:   Mackenzi was seen today for sinusitis.  Diagnoses and all orders for this visit:  Acute nasopharyngitis   I am having Robina Ade Montanari maintain her triamcinolone cream, Co Q-10, cholecalciferol, multivitamin with minerals, Omega-3 Fatty Acids (SALMON OIL-1000 PO), B COMPLETE, cetirizine, TURMERIC PO, Amino Acids (AMINO ACID PO), Potassium, esomeprazole, estradiol, FLUoxetine, triamterene-hydrochlorothiazide, ondansetron, buPROPion, benzonatate, HYDROmorphone, levothyroxine, ALPRAZolam, and benzonatate.  No orders of the defined types were placed in this encounter.   Follow-up: No Follow-up on file.  Wilfred Lacy, NP

## 2017-04-18 ENCOUNTER — Other Ambulatory Visit: Payer: Self-pay | Admitting: Internal Medicine

## 2017-04-19 ENCOUNTER — Ambulatory Visit: Payer: Medicare Other | Admitting: Family Medicine

## 2017-04-19 ENCOUNTER — Encounter: Payer: Self-pay | Admitting: Family Medicine

## 2017-04-19 ENCOUNTER — Telehealth: Payer: Self-pay | Admitting: Internal Medicine

## 2017-04-19 DIAGNOSIS — M797 Fibromyalgia: Secondary | ICD-10-CM

## 2017-04-19 MED ORDER — KETOROLAC TROMETHAMINE 60 MG/2ML IM SOLN
60.0000 mg | Freq: Once | INTRAMUSCULAR | Status: AC
  Administered 2017-04-19: 60 mg via INTRAMUSCULAR

## 2017-04-19 MED ORDER — METHYLPREDNISOLONE ACETATE 80 MG/ML IJ SUSP
80.0000 mg | Freq: Once | INTRAMUSCULAR | Status: AC
  Administered 2017-04-19: 80 mg via INTRAMUSCULAR

## 2017-04-19 MED ORDER — ONDANSETRON 8 MG PO TBDP: 8.0000 mg | ORAL_TABLET | Freq: Three times a day (TID) | ORAL | 0 refills | Status: DC | PRN

## 2017-04-19 NOTE — Telephone Encounter (Signed)
Per chart MD sent on 11/15. Sent electronically to [facility]. Called pharmacy to verify if script was received per pharmacist rx was received and ready for pick-up.../lmb  HYDROmorphone (DILAUDID) 4 MG tablet 30 tablet 0 04/15/2017    Sig - Route: Take 1 tablet (4 mg total) every 4 (four) hours as needed by mouth for severe pain. - Oral   Sent to pharmacy as: HYDROmorphone (DILAUDID) 4 MG tablet   Earliest Fill Date: 04/15/2017   E-Prescribing Status: Receipt confirmed by pharmacy (04/15/2017 3:34 PM EST)

## 2017-04-19 NOTE — Patient Instructions (Signed)
Sorry about your husband.  I injected you again and see if that helps I recommend the nose spray  I hope everything works out.  You know where I am if you need me.

## 2017-04-19 NOTE — Telephone Encounter (Signed)
Check [place] last filled 11/06/2016...Johny Chess

## 2017-04-19 NOTE — Progress Notes (Signed)
[name] [place],  [address] Phone: [phone] Subjective:    I'm seeing this patient by the request  of:    CC: full body pain    WER:[id number]  Laura Barnes is a 76 y.o. female coming in with complaint of full body pain.  Lungs fibromyalgia. Patient's husband is on hospice.  Started in the last week.  This is caused him to have some increasing stress and pain.  Patient feels like she was doing relatively well for some time and then worsening symptoms again.      Past Medical History:  Diagnosis Date  . ALLERGIC RHINITIS   . Allergy   . Anemia   . ANXIETY   . Arthritis    hands  . Bronchitis   . Chronic fatigue fibromyalgia syndrome   . Complication of anesthesia    says one time waking up she couldn't breathe, felt like her throat closing up  . DEPRESSION   . Enthesopathy of hip region   . Family history of anesthesia complication    sister with n/v  . FOOT PAIN, BILATERAL   . GERD   . Hiatal hernia   . HYPERLIPIDEMIA   . Hypothyroidism   . IBS   . MIGRAINE HEADACHE   . MIGRAINE, COMMON   . NECK MASS   . OSTEOPENIA   . OSTEOPOROSIS   . PLANTAR FASCIITIS   . RASH-NONVESICULAR   . SINUSITIS- ACUTE-NOS   . SKIN LESION   . VAGINITIS   . VENOUS INSUFFICIENCY, CHRONIC    Past Surgical History:  Procedure Laterality Date  . APPENDECTOMY    . BREAST ENHANCEMENT SURGERY    . BREAST IMPLANT REMOVAL    . CHOLECYSTECTOMY    . COLONOSCOPY    . FOOT SURGERY  11/06/2016  . MASTECTOMY     bilateral for severe bilateral fibrocystic disease  . OOPHORECTOMY    . RIGHT ENDOSCOPIC SPHENOIDECTOMY WITH FUSION SCAN Right 06/30/2013   Performed by [name], MD at [facility] OR  . s/p neck lump removal    . SHOULDER SURGERY    . TONSILLECTOMY    . VAGINAL HYSTERECTOMY     Social History   Socioeconomic History  . Marital status: Married    Spouse name: None  . Number of children: 2  . Years of education:  None  . Highest education level: None  Social Needs  . Financial resource strain: None  . Food insecurity - worry: None  . Food insecurity - inability: None  . Transportation needs - medical: None  . Transportation needs - non-medical: None  Occupational History  . Occupation: retired Press photographer  Tobacco Use  . Smoking status: Never Smoker  . Smokeless tobacco: Never Used  Substance and Sexual Activity  . Alcohol use: No  . Drug use: No  . Sexual activity: None  Other Topics Concern  . None  Social History Narrative   Has 2 biological children and 1 step child   Allergies  Allergen Reactions  . Prednisone Anaphylaxis    Patient unable to remember why she needed to steroid shot but just knows that when she got back to work she started having a lot of trouble breathing.  (jkl 05/04/14)  . Aspirin Other (See Comments)    Stomach ache and bleed  . Erythromycin Diarrhea  . Latex Other (See Comments)    blisters  . Levofloxacin Other (See Comments)    Headaches, GI upset  .  Nsaids Other (See Comments)    stomach bleeding per pt  . Statins Nausea Only and Other (See Comments)    Headache, upset stomach, joint hurt, heartburn  . Sumatriptan Hives and Palpitations  . Adhesive [Tape] Other (See Comments)    blisters  . Amoxicillin-Pot Clavulanate   . Doxycycline   . Ivp Dye [Iodinated Diagnostic Agents]     If made with blue shellfish then CAN NOT have this type of dye.   . Methylphenidate Hcl   . Mirtazapine   . Shellfish Allergy     Blue shellfish  . Hydrocodone Itching  . Hydrocodone-Acetaminophen Itching  . Rofecoxib Nausea Only  . Sertraline Hcl Nausea Only  . Sulfonamide Derivatives Itching   Family History  Problem Relation Age of Onset  . Diabetes Mother   . Heart disease Father   . Diabetes Sister   . Breast cancer Maternal Grandmother   . Heart disease Maternal Uncle        x 2  . Diabetes Other        multi relatives on father side  . Heart disease  Maternal Aunt   . Kidney disease Paternal Uncle        questionable  . Colon cancer Neg Hx   . Colon polyps Neg Hx   . Rectal cancer Neg Hx   . Stomach cancer Neg Hx      Past medical history, social, surgical and family history all reviewed in electronic medical record.  No pertanent information unless stated regarding to the chief complaint.   Review of Systems:Review of systems updated and as accurate as of 04/19/17  No  visual changes, nausea, vomiting, diarrhea, constipation, dizziness, abdominal pain, skin rash, fevers, chills, night sweats, weight loss, swollen lymph nodes, , chest pain, shortness of breath, mood changes.  Positive headaches, muscle aches, body aches  Objective  Blood pressure (!) 160/80, pulse 88, height 5' (1.524 m), weight 142 lb (64.4 kg), SpO2 98 %. Systems examined below as of 04/19/17   General: No apparent distress alert and oriented x3 mood and affect normal, dressed appropriately.  HEENT: Pupils equal, extraocular movements intact  Respiratory: Patient's speak in full sentences and does not appear short of breath  Cardiovascular: Trace lower extremity edema, non tender, no erythema  Skin: Warm dry intact with no signs of infection or rash on extremities or on axial skeleton.  Abdomen: Soft finally tender Neuro: Cranial nerves II through XII are intact, neurovascularly intact in all extremities with 2+ DTRs and 2+ pulses.  Lymph: No lymphadenopathy of posterior or anterior cervical chain or axillae bilaterally.  Gait mild antalgic favoring right knee MSK: Diffuse tender with full range of motion and good stability and symmetric strength and tone of shoulders, elbows, wrist, hip, knee and ankles bilaterally.  Does have some arthritic changes in multiple joints Patient was unable to follow directions significantly well with the range of motion.  Positive Faber test bilaterally lower back.  Patient states significant tightness in the hamstrings bilaterally  even in 10 degrees of flexion.    Impression and Recommendations:     This case required medical decision making of moderate complexity.      Note: This dictation was prepared with Dragon dictation along with smaller phrase technology. Any transcriptional errors that result from this process are unintentional.

## 2017-04-19 NOTE — Telephone Encounter (Signed)
Patient requesting refill on dilaudid.  Patient uses [facility] on [address].

## 2017-04-19 NOTE — Telephone Encounter (Signed)
I think this was just sent 11/15

## 2017-04-19 NOTE — Assessment & Plan Note (Signed)
Worsening symptoms.  We discussed other different medications that could be beneficial we would have to consider changing patient's present.  The patient declined that but were not wanted a refill of Dilaudid which we declined.  We discussed that she would have to go to pain management for that type of medication.  Patient given 2 injections a day of Toradol and Depo-Medrol to see if that would be beneficial.  Can inject the knee.  We discussed icing regimen.  Patient will follow up with me again in 6-8 weeks.

## 2017-04-23 ENCOUNTER — Emergency Department (HOSPITAL_COMMUNITY): Payer: Medicare Other

## 2017-04-23 ENCOUNTER — Other Ambulatory Visit: Payer: Self-pay

## 2017-04-23 ENCOUNTER — Encounter (HOSPITAL_COMMUNITY): Payer: Self-pay | Admitting: Emergency Medicine

## 2017-04-23 ENCOUNTER — Emergency Department (HOSPITAL_COMMUNITY)
Admission: EM | Admit: 2017-04-23 | Discharge: 2017-04-23 | Disposition: A | Discharge status: Home / Self Care | Payer: Medicare Other | Attending: Emergency Medicine | Admitting: Emergency Medicine

## 2017-04-23 DIAGNOSIS — Y999 Unspecified external cause status: Secondary | ICD-10-CM | POA: Insufficient documentation

## 2017-04-23 DIAGNOSIS — R42 Dizziness and giddiness: Secondary | ICD-10-CM | POA: Insufficient documentation

## 2017-04-23 DIAGNOSIS — W01190A Fall on same level from slipping, tripping and stumbling with subsequent striking against furniture, initial encounter: Secondary | ICD-10-CM | POA: Diagnosis not present

## 2017-04-23 DIAGNOSIS — Z91040 Latex allergy status: Secondary | ICD-10-CM | POA: Diagnosis not present

## 2017-04-23 DIAGNOSIS — Y929 Unspecified place or not applicable: Secondary | ICD-10-CM | POA: Insufficient documentation

## 2017-04-23 DIAGNOSIS — S0101XA Laceration without foreign body of scalp, initial encounter: Secondary | ICD-10-CM | POA: Diagnosis not present

## 2017-04-23 DIAGNOSIS — R9431 Abnormal electrocardiogram [ECG] [EKG]: Secondary | ICD-10-CM | POA: Diagnosis not present

## 2017-04-23 DIAGNOSIS — E039 Hypothyroidism, unspecified: Secondary | ICD-10-CM | POA: Diagnosis not present

## 2017-04-23 DIAGNOSIS — Y939 Activity, unspecified: Secondary | ICD-10-CM | POA: Insufficient documentation

## 2017-04-23 DIAGNOSIS — Z79899 Other long term (current) drug therapy: Secondary | ICD-10-CM | POA: Insufficient documentation

## 2017-04-23 DIAGNOSIS — R51 Headache: Secondary | ICD-10-CM | POA: Diagnosis not present

## 2017-04-23 DIAGNOSIS — R55 Syncope and collapse: Secondary | ICD-10-CM | POA: Diagnosis present

## 2017-04-23 LAB — CBC
HCT: 34.8 % — ABNORMAL LOW (ref 36.0–46.0)
Hemoglobin: 11.9 g/dL — ABNORMAL LOW (ref 12.0–15.0)
MCH: 31.5 pg (ref 26.0–34.0)
MCHC: 34.2 g/dL (ref 30.0–36.0)
MCV: 92.1 fL (ref 78.0–100.0)
Platelets: 264 10*3/uL (ref 150–400)
RBC: 3.78 MIL/uL — ABNORMAL LOW (ref 3.87–5.11)
RDW: 13.6 % (ref 11.5–15.5)
WBC: 10.6 10*3/uL — ABNORMAL HIGH (ref 4.0–10.5)

## 2017-04-23 LAB — BASIC METABOLIC PANEL
Anion gap: 8 (ref 5–15)
BUN: 11 mg/dL (ref 6–20)
CO2: 28 mmol/L (ref 22–32)
Calcium: 9.3 mg/dL (ref 8.9–10.3)
Chloride: 98 mmol/L — ABNORMAL LOW (ref 101–111)
Creatinine, Ser: 0.84 mg/dL (ref 0.44–1.00)
GFR calc Af Amer: >60 mL/min (ref >60)
GFR calc non Af Amer: >60 mL/min (ref >60)
Glucose, Bld: 103 mg/dL — ABNORMAL HIGH (ref 65–99)
Potassium: 4.7 mmol/L (ref 3.5–5.1)
Sodium: 134 mmol/L — ABNORMAL LOW (ref 135–145)

## 2017-04-23 LAB — CBG MONITORING, ED: Glucose-Capillary: 102 mg/dL — ABNORMAL HIGH (ref 65–99)

## 2017-04-23 MED ORDER — MECLIZINE HCL 25 MG PO TABS
25.0000 mg | ORAL_TABLET | Freq: Once | ORAL | Status: AC
  Administered 2017-04-23: 25 mg via ORAL
  Filled 2017-04-23: qty 1

## 2017-04-23 MED ORDER — LIDOCAINE-EPINEPHRINE (PF) 2 %-1:200000 IJ SOLN
10.0000 mL | Freq: Once | INTRAMUSCULAR | Status: AC
  Administered 2017-04-23: 10 mL
  Filled 2017-04-23: qty 20

## 2017-04-23 MED ORDER — MECLIZINE HCL 25 MG PO TABS: 25.0000 mg | ORAL_TABLET | Freq: Three times a day (TID) | ORAL | 0 refills | Status: DC | PRN

## 2017-04-23 NOTE — ED Notes (Signed)
Let pt know we need a urine sample. Pt unable to go at present time. Pt reports she may be able to go in an hour.

## 2017-04-23 NOTE — ED Notes (Signed)
Pt.made aware for the ned of urine specimen.

## 2017-04-23 NOTE — ED Notes (Signed)
ED Provider at bedside. 

## 2017-04-23 NOTE — ED Notes (Signed)
EKG given to EDP,Pollina,MD., for review. 

## 2017-04-23 NOTE — Discharge Instructions (Signed)
Need to have stitches out in 7-10 days by your doctor.  Make sure you are taking it easy and moving slowly with no sudden movements of your head.  Pink sure you are using a walker, handrails or chair to prevent further falls related to your vertigo.  Also remember to get nasal spray for your sinus infection.

## 2017-04-23 NOTE — ED Notes (Signed)
PT in CT.

## 2017-04-23 NOTE — ED Notes (Signed)
Pt verbalizes getting up to take dog out and "had a vertigo spell" resulting in hitting head on dresser. Large hematoma noted to posterior head. Pt denies LOC or blood thinner use.

## 2017-04-23 NOTE — ED Notes (Signed)
Pt ambulatory with walker. Has walker at home

## 2017-04-23 NOTE — ED Provider Notes (Signed)
[facility] DEPT Provider Note   CSN: [id number] Arrival date & time: 04/23/17  2694     History   Chief Complaint Chief Complaint  Patient presents with  . Near Syncope    HPI Laura Barnes is a 76 y.o. female.  Patient is a 76 year old female with a past medical history of fibromyalgia, migraine headaches, depression who presents with symptoms of being dizzy which led to her falling today hitting her head on the metal of her bed.  She is complaining of pain where she hit her head, bleeding but denies any loss of consciousness.  She does not take any anticoagulation.  Patient states for the last 2 weeks she has had a sinus infection with congestion, drainage, intermittent feelings of her ears being clogged but no fever.  She saw the PA at her doctor's office who recommended she try to do some nasal sprays.  Patient had not gotten saline spray yet but was hesitant to use any antibiotics due to her extensive allergy list.  Patient states last night before going to bed she felt slightly dizzy which she describes as a feeling of off balance and rotation.  This morning her dog wanted to go outside but she tried to get out of bed which caused her to lose her balance and fall.  She is complaining of a dizziness that occurs when she moves her eyes head or body.  She has had spells of vertigo in the past which are similar but this 1 is just more severe.  She was able to ambulate with a walker but did not feel stable otherwise.  He denies any vision changes, unilateral numbness or weakness or any speech difficulty.  She denies any neck pain and denies injuring her arms or legs.   The history is provided by the patient.    Past Medical History:  Diagnosis Date  . ALLERGIC RHINITIS   . Allergy   . Anemia   . ANXIETY   . Arthritis    hands  . Bronchitis   . Chronic fatigue fibromyalgia syndrome   . Complication of anesthesia    says one time waking up she  couldn't breathe, felt like her throat closing up  . DEPRESSION   . Enthesopathy of hip region   . Family history of anesthesia complication    sister with n/v  . FOOT PAIN, BILATERAL   . GERD   . Hiatal hernia   . HYPERLIPIDEMIA   . Hypothyroidism   . IBS   . MIGRAINE HEADACHE   . MIGRAINE, COMMON   . NECK MASS   . OSTEOPENIA   . OSTEOPOROSIS   . PLANTAR FASCIITIS   . RASH-NONVESICULAR   . SINUSITIS- ACUTE-NOS   . SKIN LESION   . VAGINITIS   . VENOUS INSUFFICIENCY, CHRONIC     Patient Active Problem List   Diagnosis Date Noted  . Nonintractable headache 12/31/2016  . Dysphagia 10/28/2016  . Skin abnormalities 10/28/2016  . Preoperative clearance 10/28/2016  . Lump of skin 05/11/2016  . 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 Chronic meniscal tear of knee 03/05/2016  . Weak urinary stream 03/04/2016  . Nausea 03/04/2016  . Shortness of breath 03/03/2016  . Urinary hesitancy 02/28/2016  . Lightheadedness 12/18/2015  . Osteopenia 12/03/2015  . Thyroid nodule 11/16/2015  . Bilateral leg edema 11/05/2015  . Anterior neck pain 11/05/2015  . Postmenopausal 11/05/2015  . Subacromial bursitis 09/16/2015  . Umbilical pain 85/46/2703  . Spondylosis of  lumbar region without myelopathy or radiculopathy 03/12/2015  . Trochanteric bursitis of both hips 03/12/2015  . Subacromial impingement of left shoulder 03/12/2015  . Breast implant deflation 06/05/2014  . Hyperlipidemia 05/01/2014  . Chronic pain syndrome 05/01/2014  . Abnormal breast finding 05/01/2014  . Lumbar radiculopathy 03/27/2014  . Left shoulder pain 11/07/2013  . Degeneration of intervertebral disc of cervical region 10/10/2013  . Fibromyalgia 09/06/2013  . Bilateral shoulder pain 09/06/2013  . Increased frequency of urination 08/12/2013  . Low back pain 08/12/2013  . Chronic cough 04/12/2013  . Sinusitis, chronic 04/12/2013  . Menopausal flushing 11/15/2012  . Hypothyroidism 04/07/2012  . Facial pain 10/01/2010    . Chronic venous insufficiency 04/02/2010  . FOOT PAIN, BILATERAL 04/02/2010  . Enthesopathy of hip region 07/19/2007  . Osteoporosis 07/19/2007  . Anxiety state 01/23/2007  . Depression 01/23/2007  . Migraine 01/23/2007  . NINAR (noninfectious nonallergic rhinitis) 01/23/2007  . Gastroesophageal reflux disease 01/23/2007  . IBS 01/23/2007    Past Surgical History:  Procedure Laterality Date  . APPENDECTOMY    . BREAST ENHANCEMENT SURGERY    . BREAST IMPLANT REMOVAL    . CHOLECYSTECTOMY    . COLONOSCOPY    . FOOT SURGERY  11/06/2016  . MASTECTOMY     bilateral for severe bilateral fibrocystic disease  . OOPHORECTOMY    . s/p neck lump removal    . SHOULDER SURGERY    . SINUS ENDO W/FUSION Right 06/30/2013   Procedure: RIGHT ENDOSCOPIC SPHENOIDECTOMY WITH FUSION SCAN;  Surgeon: [name], MD;  Location: [facility];  Service: ENT;  Laterality: Right;  . TONSILLECTOMY    . VAGINAL HYSTERECTOMY      OB History    No data available       Home Medications    Prior to Admission medications   Medication Sig Start Date End Date Taking? Authorizing Provider  ALPRAZolam (XANAX) 0.5 MG tablet TAKE 1 TABLET BY MOUTH THREE TIMES DAILY AS NEEDED FOR ANXIETY 02/23/17   Burns, Claudina Lick, MD  Amino Acids (AMINO ACID PO) Take 1 capsule by mouth 2 (two) times daily.    [provider]  B Complex-Biotin-FA (B COMPLETE) TABS Take 1 tablet by mouth daily. B Complete 100mg     [provider]  benzonatate (TESSALON) 200 MG capsule TAKE 1 CAPSULE(200 MG) BY MOUTH THREE TIMES DAILY AS NEEDED FOR COUGH 12/04/16   Burns, Claudina Lick, MD  benzonatate (TESSALON) 200 MG capsule TAKE 1 CAPSULE(200 MG) BY MOUTH THREE TIMES DAILY AS NEEDED FOR COUGH 04/13/17   Brand Males, MD  buPROPion (WELLBUTRIN XL) 300 MG 24 hr tablet TAKE 1 TABLET BY MOUTH DAILY 11/30/16   Binnie Rail, MD  cetirizine (ZYRTEC) 10 MG tablet TAKE 1 TABLET (10 MG TOTAL) BY MOUTH DAILY.    Biagio Borg, MD   cholecalciferol (VITAMIN D) 1000 UNITS tablet Take 1,000 Units by mouth daily.    [provider]  Coenzyme Q10 (CO Q-10) 200 MG CAPS Take 1 capsule by mouth 2 (two) times daily.    [provider]  esomeprazole (NEXIUM) 20 MG capsule Take 20 mg by mouth daily at 12 noon.    [provider]  estradiol (ESTRACE) 1 MG tablet TAKE 1 TABLET BY MOUTH EVERY DAY 01/28/16   Biagio Borg, MD  estradiol (ESTRACE) 1 MG tablet TAKE 1 TABLET(1 MG) BY MOUTH DAILY 04/19/17   Binnie Rail, MD  FLUoxetine (PROZAC) 20 MG capsule TAKE 1 CAPSULE BY  MOUTH EVERY DAY 11/04/16   Binnie Rail, MD  HYDROmorphone (DILAUDID) 4 MG tablet Take 1 tablet (4 mg total) every 4 (four) hours as needed by mouth for severe pain. 04/15/17   Binnie Rail, MD  levothyroxine (SYNTHROID, LEVOTHROID) 75 MCG tablet TAKE 1 TABLET BY MOUTH EVERY DAY IN THE MORNING 02/16/17   Lyndal Pulley, DO  Multiple Vitamin (MULTIVITAMIN WITH MINERALS) TABS tablet Take 1 tablet by mouth daily.    [provider]  Omega-3 Fatty Acids (SALMON OIL-1000 PO) Take 1,000 mg by mouth daily.    [provider]  ondansetron (ZOFRAN) 4 MG tablet Take 1 tablet (4 mg total) by mouth every 8 (eight) hours as needed. 11/04/16   Hyatt, Max T, DPM  ondansetron (ZOFRAN-ODT) 8 MG disintegrating tablet Take 1 tablet (8 mg total) every 8 (eight) hours as needed by mouth for nausea or vomiting. 04/19/17   Lyndal Pulley, DO  Potassium 99 MG TABS Take 99 mg by mouth daily.    [provider]  triamcinolone cream (KENALOG) 0.5 % Apply topically 2 (two) times daily. As needed for rash 04/07/12   Biagio Borg, MD  triamterene-hydrochlorothiazide (MAXZIDE-25) 37.5-25 MG tablet TAKE 1 TABLET BY MOUTH EVERY DAY 11/04/16   Binnie Rail, MD  TURMERIC PO Take by mouth.    [provider]    Family History Family History  Problem Relation Age of Onset  . Diabetes Mother   . Heart disease Father   . Diabetes Sister    . Breast cancer Maternal Grandmother   . Heart disease Maternal Uncle        x 2  . Diabetes Other        multi relatives on father side  . Heart disease Maternal Aunt   . Kidney disease Paternal Uncle        questionable  . Colon cancer Neg Hx   . Colon polyps Neg Hx   . Rectal cancer Neg Hx   . Stomach cancer Neg Hx     Social History Social History   Tobacco Use  . Smoking status: Never Smoker  . Smokeless tobacco: Never Used  Substance Use Topics  . Alcohol use: No  . Drug use: No     Allergies   Prednisone; Aspirin; Erythromycin; Latex; Levofloxacin; Nsaids; Statins; Sumatriptan; Adhesive [tape]; Amoxicillin-pot clavulanate; Doxycycline; Ivp dye [iodinated diagnostic agents]; Methylphenidate hcl; Mirtazapine; Shellfish allergy; Hydrocodone; Hydrocodone-acetaminophen; Rofecoxib; Sertraline hcl; and Sulfonamide derivatives   Review of Systems Review of Systems  All other systems reviewed and are negative.    Physical Exam Updated Vital Signs BP (!) 163/99 (BP Location: Left Arm)   Pulse 93   Temp 98 F (36.7 C) (Oral)   Resp 15   Ht 5' (1.524 m)   Wt 64.4 kg (142 lb)   SpO2 96%   BMI 27.73 kg/m   Physical Exam  Constitutional: She is oriented to person, place, and time. She appears well-developed and well-nourished. No distress.  HENT:  Head: Normocephalic. Head is with contusion and with laceration.    Mouth/Throat: Oropharynx is clear and moist.  Eyes: Conjunctivae and EOM are normal. Pupils are equal, round, and reactive to light.  Neck: Normal range of motion. Neck supple. No spinous process tenderness and no muscular tenderness present. Normal range of motion present.  Cardiovascular: Normal rate, regular rhythm and intact distal pulses.  No murmur heard. Pulmonary/Chest: Effort normal and breath sounds normal. No respiratory distress. She has  no wheezes. She has no rales.  Abdominal: Soft. She exhibits no distension. There is no tenderness.  There is no rebound and no guarding.  Musculoskeletal: Normal range of motion. She exhibits no edema or tenderness.  Neurological: She is alert and oriented to person, place, and time. She has normal strength. No cranial nerve deficit or sensory deficit.  nystagmus  Skin: Skin is warm and dry. No rash noted. No erythema.  Psychiatric: She has a normal mood and affect. Her behavior is normal.  Nursing note and vitals reviewed.    ED Treatments / Results  Labs (all labs ordered are listed, but only abnormal results are displayed) Labs Reviewed  CBG MONITORING, ED - Abnormal; Notable for the following components:      Result Value   Glucose-Capillary 102 (*)    All other components within normal limits  BASIC METABOLIC PANEL  CBC  URINALYSIS, ROUTINE W REFLEX MICROSCOPIC    EKG  EKG Interpretation  Date/Time:  Friday April 23 2017 06:44:10 EST Ventricular Rate:  80 PR Interval:    QRS Duration: 87 QT Interval:  367 QTC Calculation: 424 R Axis:   24 Text Interpretation:  Sinus rhythm Low voltage, precordial leads Otherwise within normal limits Confirmed by Orpah Greek [phone]) on 04/23/2017 6:48:37 AM Also confirmed by Orpah Greek [phone]), editor Hattie Perch ([id number])  on 04/23/2017 7:01:48 AM       Radiology No results found.  Procedures Procedures (including critical care time) LACERATION REPAIR Performed by: [employer] Authorized by: Blanchie Dessert Consent: Verbal consent obtained. Risks and benefits: risks, benefits and alternatives were discussed Consent given by: patient Patient identity confirmed: provided demographic data Prepped and Draped in normal sterile fashion Wound explored  Laceration Location: occiput  Laceration Length: 2cm  No Foreign Bodies seen or palpated  Anesthesia: local infiltration  Local anesthetic: lidocaine 1% with epinephrine  Anesthetic total:3 ml  Irrigation method: syringe Amount of  cleaning: standard  Skin closure: staples  Number of sutures: 3   Patient tolerance: Patient tolerated the procedure well with no immediate complications.   Medications Ordered in ED Medications  meclizine (ANTIVERT) tablet 25 mg (not administered)  lidocaine-EPINEPHrine (XYLOCAINE W/EPI) 2 %-1:200000 (PF) injection 10 mL (not administered)     Initial Impression / Assessment and Plan / ED Course  I have reviewed the triage vital signs and the nursing notes.  Pertinent labs & imaging results that were available during my care of the patient were reviewed by me and considered in my medical decision making (see chart for details).    Pt with sx most consistent with peripheral vertigo.  No systemic or infectious sx.  Normal neuro exam without weakness, ataxia or cerebellar findings on exam.  Normal vision.  Sx are reproducible with movement of the head and attempting to walk.  No hx of Stroke and low likelihood.  No risk factors and normal VS.    Tetanus is UTD Will treat for peripheral vertigo and re-eval.  CT to eval for underlying injury.  Wound repair as above.  9:22 AM Head CT is neg.  Meclizine with some improvement of the vertigo however pt does not want any other meds.  She knows to f/u with pcp on Monday if not improved. Prior to D/c pt was able to ambulate without ataxia   Final Clinical Impressions(s) / ED Diagnoses   Final diagnoses:  Vertigo  Scalp laceration, initial encounter    ED Discharge Orders  Ordered    meclizine (ANTIVERT) 25 MG tablet  3 times daily PRN     04/23/17 4801       Blanchie Dessert, MD 04/23/17 [phone]

## 2017-04-27 ENCOUNTER — Ambulatory Visit: Payer: Self-pay | Admitting: *Deleted

## 2017-04-27 NOTE — Telephone Encounter (Signed)
Pt calling in c/o her "pupils being very open and wide"  She had a vertigo incident that resulted in a fall last Friday.  She went to the ED and they told her she had fluid in her left ear that caused the vertigo.  She ended up with 3 staples in her head. Today she is concerned with her pupils being dilated.   No headache or changes of any kind.  She is taking Meclizine for the vertigo which is helping.   She stated she had one episode yesterday but took the Meclizine and it helped.   She also is concerned about a blood test they did in the ED that revealed she could possibly have something going on with her kidneys.   She's wanting Dr. Quay Burow to address this issue and the dilated pupils.  Could it be from the Meclizine the dilated pupils? I routed a note to the nurse pool asking them to get in touch with her regarding this.  She also has 3 staples in her head she was asking about having removed.

## 2017-04-27 NOTE — Telephone Encounter (Signed)
Called pt no answer LMOM RTC.../lmb 

## 2017-04-27 NOTE — Telephone Encounter (Signed)
Notified pt w/MD response. She states someone has already made her an appt for Fri 11/30 for removal of staples. That would make 1 week...Johny Chess

## 2017-04-27 NOTE — Telephone Encounter (Signed)
Staple removal in one week. See below

## 2017-04-27 NOTE — Telephone Encounter (Signed)
Meclizine can cause dilated pupils - it is a common side effect.  The blood test shows normal kidney function.

## 2017-04-29 DIAGNOSIS — R42 Dizziness and giddiness: Secondary | ICD-10-CM | POA: Insufficient documentation

## 2017-04-29 DIAGNOSIS — S0101XA Laceration without foreign body of scalp, initial encounter: Secondary | ICD-10-CM | POA: Insufficient documentation

## 2017-04-29 NOTE — Patient Instructions (Addendum)
Your staples were removed. Your wound looks good.   Continue the meclizine as needed for dizziness.  Take tylenol as needed if you get headaches.    Vertigo Vertigo is the feeling that you or your surroundings are moving when they are not. Vertigo can be dangerous if it occurs while you are doing something that could endanger you or others, such as driving. What are the causes? This condition is caused by a disturbance in the signals that are sent by your body's sensory systems to your brain. Different causes of a disturbance can lead to vertigo, including:  Infections, especially in the inner ear.  A bad reaction to a drug, or misuse of alcohol and medicines.  Withdrawal from drugs or alcohol.  Quickly changing positions, as when lying down or rolling over in bed.  Migraine headaches.  Decreased blood flow to the brain.  Decreased blood pressure.  Increased pressure in the brain from a head or neck injury, stroke, infection, tumor, or bleeding.  Central nervous system disorders.  What are the signs or symptoms? Symptoms of this condition usually occur when you move your head or your eyes in different directions. Symptoms may start suddenly, and they usually last for less than a minute. Symptoms may include:  Loss of balance and falling.  Feeling like you are spinning or moving.  Feeling like your surroundings are spinning or moving.  Nausea and vomiting.  Blurred vision or double vision.  Difficulty hearing.  Slurred speech.  Dizziness.  Involuntary eye movement (nystagmus).  Symptoms can be mild and cause only slight annoyance, or they can be severe and interfere with daily life. Episodes of vertigo may return (recur) over time, and they are often triggered by certain movements. Symptoms may improve over time. How is this diagnosed? This condition may be diagnosed based on medical history and the quality of your nystagmus. Your health care provider may test your  eye movements by asking you to quickly change positions to trigger the nystagmus. This may be called the Dix-Hallpike test, head thrust test, or roll test. You may be referred to a health care provider who specializes in ear, nose, and throat (ENT) problems (otolaryngologist) or a provider who specializes in disorders of the central nervous system (neurologist). You may have additional testing, including:  A physical exam.  Blood tests.  MRI.  A CT scan.  An electrocardiogram (ECG). This records electrical activity in your heart.  An electroencephalogram (EEG). This records electrical activity in your brain.  Hearing tests.  How is this treated? Treatment for this condition depends on the cause and the severity of the symptoms. Treatment options include:  Medicines to treat nausea or vertigo. These are usually used for severe cases. Some medicines that are used to treat other conditions may also reduce or eliminate vertigo symptoms. These include: ? Medicines that control allergies (antihistamines). ? Medicines that control seizures (anticonvulsants). ? Medicines that relieve depression (antidepressants). ? Medicines that relieve anxiety (sedatives).  Head movements to adjust your inner ear back to normal. If your vertigo is caused by an ear problem, your health care provider may recommend certain movements to correct the problem.  Surgery. This is rare.  Follow these instructions at home: Safety  Move slowly.Avoid sudden body or head movements.  Avoid driving.  Avoid operating heavy machinery.  Avoid doing any tasks that would cause danger to you or others if you would have a vertigo episode during the task.  If you have trouble walking or  keeping your balance, try using a cane for stability. If you feel dizzy or unstable, sit down right away.  Return to your normal activities as told by your health care provider. Ask your health care provider what activities are safe for  you. General instructions  Take over-the-counter and prescription medicines only as told by your health care provider.  Avoid certain positions or movements as told by your health care provider.  Drink enough fluid to keep your urine clear or pale yellow.  Keep all follow-up visits as told by your health care provider. This is important. Contact a health care provider if:  Your medicines do not relieve your vertigo or they make it worse.  You have a fever.  Your condition gets worse or you develop new symptoms.  Your family or friends notice any behavioral changes.  Your nausea or vomiting gets worse.  You have numbness or a "pins and needles" sensation in part of your body. Get help right away if:  You have difficulty moving or speaking.  You are always dizzy.  You faint.  You develop severe headaches.  You have weakness in your hands, arms, or legs.  You have changes in your hearing or vision.  You develop a stiff neck.  You develop sensitivity to light. This information is not intended to replace advice given to you by your health care provider. Make sure you discuss any questions you have with your health care provider. Document Released: 02/25/2005 Document Revised: 10/30/2015 Document Reviewed: 09/10/2014 Elsevier Interactive Patient Education  2017 [employer].

## 2017-04-29 NOTE — Progress Notes (Signed)
Subjective:    Patient ID: Laura Barnes, female    DOB: [birth year], 76 y.o.   MRN: [id number]  HPI The patient is here for follow up.  She went to the ED 04/23/17 for dizziness that caused her to fall and hit her head on the metal part of her bed.  There was no LOC.  She had been experiencing a sinus infection.  Two nights prior to going to the ED she felt a little dizzy.  The morning she went to the ED she got out of bed and she lost her balance and fell.  The dizziness occurred when she moved her eye, head or body.  She has had vertigo in the past.  She denied numbness/tingling, weakness, changes in her vision. Ct of the head was negative for bleed.   Her EKG was normal.  She had a laceration in the posterior right side of her head.  She had three staples placed.  She was prescribed meclizine and discharged home.   The dizziness slowly went away since the Ed visit.  She has meclizine at home, but has not needed it.  She denies headaches and pain at the site of her laceration.  She denies blurry vision, nausea or fever.       Medications and allergies reviewed with patient and updated if appropriate.  Patient Active Problem List   Diagnosis Date Noted  . Scalp laceration, initial encounter 04/29/2017  . Vertigo 04/29/2017  . Nonintractable headache 12/31/2016  . Dysphagia 10/28/2016  . Skin abnormalities 10/28/2016  . Preoperative clearance 10/28/2016  . Lump of skin 05/11/2016  . 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 Chronic meniscal tear of knee 03/05/2016  . Weak urinary stream 03/04/2016  . Nausea 03/04/2016  . Shortness of breath 03/03/2016  . Urinary hesitancy 02/28/2016  . Lightheadedness 12/18/2015  . Osteopenia 12/03/2015  . Thyroid nodule 11/16/2015  . Bilateral leg edema 11/05/2015  . Anterior neck pain 11/05/2015  . Postmenopausal 11/05/2015  . Subacromial bursitis 09/16/2015  . Umbilical pain 40/98/1191  . Spondylosis 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 myelopathy or  radiculopathy 03/12/2015  . Trochanteric bursitis of both hips 03/12/2015  . Subacromial impingement of left shoulder 03/12/2015  . Breast implant deflation 06/05/2014  . Hyperlipidemia 05/01/2014  . Chronic pain syndrome 05/01/2014  . Abnormal breast finding 05/01/2014  . Lumbar radiculopathy 03/27/2014  . Left shoulder pain 11/07/2013  . Degeneration of intervertebral disc of cervical region 10/10/2013  . Fibromyalgia 09/06/2013  . Bilateral shoulder pain 09/06/2013  . Increased frequency of urination 08/12/2013  . Low back pain 08/12/2013  . Chronic cough 04/12/2013  . Sinusitis, chronic 04/12/2013  . Menopausal flushing 11/15/2012  . Hypothyroidism 04/07/2012  . Facial pain 10/01/2010  . Chronic venous insufficiency 04/02/2010  . FOOT PAIN, BILATERAL 04/02/2010  . Enthesopathy of hip region 07/19/2007  . Osteoporosis 07/19/2007  . Anxiety state 01/23/2007  . Depression 01/23/2007  . Migraine 01/23/2007  . NINAR (noninfectious nonallergic rhinitis) 01/23/2007  . Gastroesophageal reflux disease 01/23/2007  . IBS 01/23/2007    Current Outpatient Medications on File Prior to Visit  Medication Sig Dispense Refill  . 5-Hydroxytryptophan (5-HTP PO) Take 1 capsule by mouth 2 (two) times daily.    Marland Kitchen acetaminophen (TYLENOL) 500 MG tablet Take 1,000 mg by mouth every 6 (six) hours as needed for mild pain, moderate pain, fever or headache.    . ALPRAZolam (XANAX) 0.5 MG tablet TAKE 1 TABLET BY MOUTH THREE TIMES DAILY AS NEEDED FOR  ANXIETY (Patient taking differently: TAKE 1 TABLET BY MOUTH every night) 90 tablet 1  . Amino Acids (AMINO ACID PO) Take 1 capsule by mouth 2 (two) times daily.    . Ascorbic Acid (VITAMIN C) 1000 MG tablet Take 1,000 mg by mouth 2 (two) times daily.    . B Complex-Biotin-FA (B COMPLETE) TABS Take 1 tablet by mouth daily. B Complete 100mg     . benzonatate (TESSALON) 200 MG capsule TAKE 1 CAPSULE(200 MG) BY MOUTH THREE TIMES DAILY AS NEEDED FOR COUGH (Patient  taking differently: TAKE 1 CAPSULE(200 MG) BY MOUTH two TIMES DAILY for cough) 90 capsule 0  . buPROPion (WELLBUTRIN XL) 300 MG 24 hr tablet TAKE 1 TABLET BY MOUTH DAILY 90 tablet 1  . cetirizine (ZYRTEC) 10 MG tablet TAKE 1 TABLET (10 MG TOTAL) BY MOUTH DAILY. 30 tablet 11  . cholecalciferol (VITAMIN D) 1000 UNITS tablet Take 1,000 Units by mouth daily.    . Coenzyme Q10 (CO Q-10) 200 MG CAPS Take 1 capsule by mouth 2 (two) times daily.    Marland Kitchen ECHINACEA PO Take 1 tablet by mouth 2 (two) times daily.    Marland Kitchen estradiol (ESTRACE) 1 MG tablet TAKE 1 TABLET BY MOUTH EVERY DAY 90 tablet 0  . FLUoxetine (PROZAC) 20 MG capsule TAKE 1 CAPSULE BY MOUTH EVERY DAY 90 capsule 1  . HYDROmorphone (DILAUDID) 4 MG tablet Take 1 tablet (4 mg total) every 4 (four) hours as needed by mouth for severe pain. 30 tablet 0  . LECITHIN PO Take 1 tablet by mouth daily.    Marland Kitchen levothyroxine (SYNTHROID, LEVOTHROID) 75 MCG tablet TAKE 1 TABLET BY MOUTH EVERY DAY IN THE MORNING 90 tablet 0  . meclizine (ANTIVERT) 25 MG tablet Take 1 tablet (25 mg total) by mouth 3 (three) times daily as needed for dizziness. 30 tablet 0  . Multiple Vitamin (MULTIVITAMIN WITH MINERALS) TABS tablet Take 1 tablet by mouth daily.    . Omega-3 Fatty Acids (SALMON OIL-1000 PO) Take 1,000 mg by mouth daily.    Marland Kitchen omeprazole (PRILOSEC OTC) 20 MG tablet Take 20 mg by mouth daily.    . ondansetron (ZOFRAN) 4 MG tablet Take 1 tablet (4 mg total) by mouth every 8 (eight) hours as needed. (Patient taking differently: Take 4 mg by mouth every 8 (eight) hours as needed for nausea or vomiting. ) 20 tablet 0  . ondansetron (ZOFRAN-ODT) 8 MG disintegrating tablet Take 1 tablet (8 mg total) every 8 (eight) hours as needed by mouth for nausea or vomiting. 30 tablet 0  . triamterene-hydrochlorothiazide (MAXZIDE-25) 37.5-25 MG tablet TAKE 1 TABLET BY MOUTH EVERY DAY 90 tablet 3  . TURMERIC PO Take 1 tablet by mouth 2 (two) times daily.     . vitamin E 400 UNIT capsule Take  400 Units by mouth daily.     No current facility-administered medications on file prior to visit.     Past Medical History:  Diagnosis Date  . ALLERGIC RHINITIS   . Allergy   . Anemia   . ANXIETY   . Arthritis    hands  . Bronchitis   . Chronic fatigue fibromyalgia syndrome   . Complication of anesthesia    says one time waking up she couldn't breathe, felt like her throat closing up  . DEPRESSION   . Enthesopathy of hip region   . Family history of anesthesia complication    sister with n/v  . FOOT PAIN, BILATERAL   . GERD   .  Hiatal hernia   . HYPERLIPIDEMIA   . Hypothyroidism   . IBS   . MIGRAINE HEADACHE   . MIGRAINE, COMMON   . NECK MASS   . OSTEOPENIA   . OSTEOPOROSIS   . PLANTAR FASCIITIS   . RASH-NONVESICULAR   . SINUSITIS- ACUTE-NOS   . SKIN LESION   . VAGINITIS   . VENOUS INSUFFICIENCY, CHRONIC     Past Surgical History:  Procedure Laterality Date  . APPENDECTOMY    . BREAST ENHANCEMENT SURGERY    . BREAST IMPLANT REMOVAL    . CHOLECYSTECTOMY    . COLONOSCOPY    . FOOT SURGERY  11/06/2016  . MASTECTOMY     bilateral for severe bilateral fibrocystic disease  . OOPHORECTOMY    . s/p neck lump removal    . SHOULDER SURGERY    . SINUS ENDO W/FUSION Right 06/30/2013   Procedure: RIGHT ENDOSCOPIC SPHENOIDECTOMY WITH FUSION SCAN;  Surgeon: [name], MD;  Location: [facility];  Service: ENT;  Laterality: Right;  . TONSILLECTOMY    . VAGINAL HYSTERECTOMY      Social History   Socioeconomic History  . Marital status: Married    Spouse name: None  . Number of children: 2  . Years of education: None  . Highest education level: None  Social Needs  . Financial resource strain: None  . Food insecurity - worry: None  . Food insecurity - inability: None  . Transportation needs - medical: None  . Transportation needs - non-medical: None  Occupational History  . Occupation: retired Press photographer  Tobacco Use  . Smoking status: Never Smoker  .  Smokeless tobacco: Never Used  Substance and Sexual Activity  . Alcohol use: No  . Drug use: No  . Sexual activity: None  Other Topics Concern  . None  Social History Narrative   Has 2 biological children and 1 step child    Family History  Problem Relation Age of Onset  . Diabetes Mother   . Heart disease Father   . Diabetes Sister   . Breast cancer Maternal Grandmother   . Heart disease Maternal Uncle        x 2  . Diabetes Other        multi relatives on father side  . Heart disease Maternal Aunt   . Kidney disease Paternal Uncle        questionable  . Colon cancer Neg Hx   . Colon polyps Neg Hx   . Rectal cancer Neg Hx   . Stomach cancer Neg Hx     Review of Systems  Constitutional: Negative for fever.  Eyes: Negative for visual disturbance.  Gastrointestinal: Negative for nausea.  Musculoskeletal: Negative for gait problem.  Neurological: Negative for dizziness, light-headedness and headaches.       Objective:   Vitals:   04/30/17 1444  BP: 136/80  Pulse: 92  Resp: 16  Temp: 98 F (36.7 C)  SpO2: 97%   Wt Readings from Last 3 Encounters:  04/30/17 143 lb (64.9 kg)  04/23/17 142 lb (64.4 kg)  04/19/17 142 lb (64.4 kg)   Body mass index is 27.93 kg/m.   Physical Exam  Constitutional: She is oriented to person, place, and time. She appears well-developed and well-nourished. No distress.  HENT:  Head: Normocephalic.  Right Ear: External ear normal.  Left Ear: External ear normal.  Three staples in right posterior- top of head - surrounding ecchymosis and swelling - minimally tender;  Normal TM  normal  Eyes: Conjunctivae are normal.  Neurological: She is alert and oriented to person, place, and time.  Skin: Skin is warm and dry. She is not diaphoretic.  Psychiatric: She has a normal mood and affect. Her behavior is normal.           Assessment & Plan:    See Problem List for Assessment and Plan of chronic medical problems.

## 2017-04-30 ENCOUNTER — Ambulatory Visit (INDEPENDENT_AMBULATORY_CARE_PROVIDER_SITE_OTHER): Payer: Medicare Other | Admitting: Internal Medicine

## 2017-04-30 ENCOUNTER — Encounter: Payer: Self-pay | Admitting: Internal Medicine

## 2017-04-30 VITALS — BP 136/80 | HR 92 | Temp 98.0°F | Resp 16 | Wt 143.0 lb

## 2017-04-30 DIAGNOSIS — R42 Dizziness and giddiness: Secondary | ICD-10-CM | POA: Diagnosis not present

## 2017-04-30 DIAGNOSIS — S0101XA Laceration without foreign body of scalp, initial encounter: Secondary | ICD-10-CM

## 2017-04-30 NOTE — Assessment & Plan Note (Signed)
S/p 3 staples - removed today Healing well - minimally tender No headaches or concerning symptoms Call if develops headaches or other concerning symmptoms

## 2017-04-30 NOTE — Assessment & Plan Note (Signed)
No vertigo since ED Has meclizine and zofran prn at home Call with questions

## 2017-05-02 ENCOUNTER — Other Ambulatory Visit: Payer: Self-pay | Admitting: Internal Medicine

## 2017-05-14 ENCOUNTER — Other Ambulatory Visit: Payer: Self-pay | Admitting: Family Medicine

## 2017-05-25 ENCOUNTER — Other Ambulatory Visit: Payer: Self-pay | Admitting: Internal Medicine

## 2017-05-26 ENCOUNTER — Other Ambulatory Visit: Payer: Self-pay | Admitting: Internal Medicine

## 2017-05-26 DIAGNOSIS — F419 Anxiety disorder, unspecified: Secondary | ICD-10-CM

## 2017-05-26 NOTE — Telephone Encounter (Signed)
Check [place] registry last filled 04/12/2017...Johny Chess

## 2017-05-27 ENCOUNTER — Encounter: Payer: Self-pay | Admitting: Family Medicine

## 2017-05-27 ENCOUNTER — Ambulatory Visit: Payer: Medicare Other | Admitting: Family Medicine

## 2017-05-27 DIAGNOSIS — M7061 Trochanteric bursitis, right hip: Secondary | ICD-10-CM | POA: Diagnosis not present

## 2017-05-27 NOTE — Patient Instructions (Signed)
Good to see you  Laura Barnes is your friend.  We injected you today  I am sorry for your loss.  I hope this helps Make another appointment with me in 3 weeks to make sure you are doing well.

## 2017-05-27 NOTE — Progress Notes (Signed)
[name] [address], [place] [address] Phone: [phone] Subjective:    I'm seeing this patient by the request  of:    CC: Right hip pain  [id number]  Laura Barnes is a 76 y.o. female coming in with complaint of right hip pain.  Patient likely having worsening pain secondary to sleeping a little bit more often.  Patient did have her husband recently died.  Patient is having difficulty with his family.  Patient does have past medical history significant for fibromyalgia.  Feels that everything is worsening a little bit.  Patient states that is unable to sleep on the right side of her hip.  Describes the pain is a dull, throbbing aching sensation.     Past Medical History:  Diagnosis Date  . ALLERGIC RHINITIS   . Allergy   . Anemia   . ANXIETY   . Arthritis    hands  . Bronchitis   . Chronic fatigue fibromyalgia syndrome   . Complication of anesthesia    says one time waking up she couldn't breathe, felt like her throat closing up  . DEPRESSION   . Enthesopathy of hip region   . Family history of anesthesia complication    sister with n/v  . FOOT PAIN, BILATERAL   . GERD   . Hiatal hernia   . HYPERLIPIDEMIA   . Hypothyroidism   . IBS   . MIGRAINE HEADACHE   . MIGRAINE, COMMON   . NECK MASS   . OSTEOPENIA   . OSTEOPOROSIS   . PLANTAR FASCIITIS   . RASH-NONVESICULAR   . SINUSITIS- ACUTE-NOS   . SKIN LESION   . VAGINITIS   . VENOUS INSUFFICIENCY, CHRONIC    Past Surgical History:  Procedure Laterality Date  . APPENDECTOMY    . BREAST ENHANCEMENT SURGERY    . BREAST IMPLANT REMOVAL    . CHOLECYSTECTOMY    . COLONOSCOPY    . FOOT SURGERY  11/06/2016  . MASTECTOMY     bilateral for severe bilateral fibrocystic disease  . OOPHORECTOMY    . s/p neck lump removal    . SHOULDER SURGERY    . SINUS ENDO W/FUSION Right 06/30/2013   Procedure: RIGHT ENDOSCOPIC SPHENOIDECTOMY WITH FUSION SCAN;  Surgeon: [name], MD;  Location: [facility];  Service: ENT;  Laterality: Right;  . TONSILLECTOMY    . VAGINAL HYSTERECTOMY     Social History   Socioeconomic History  . Marital status: Married    Spouse name: Not on file  . Number of children: 2  . Years of education: Not on file  . Highest education level: Not on file  Social Needs  . Financial resource strain: Not on file  . Food insecurity - worry: Not on file  . Food insecurity - inability: Not on file  . Transportation needs - medical: Not on file  . Transportation needs - non-medical: Not on file  Occupational History  . Occupation: retired Press photographer  Tobacco Use  . Smoking status: Never Smoker  . Smokeless tobacco: Never Used  Substance and Sexual Activity  . Alcohol use: No  . Drug use: No  . Sexual activity: Not on file  Other Topics Concern  . Not on file  Social History Narrative   Has 2 biological children and 1 step child   Allergies  Allergen Reactions  . Prednisone Anaphylaxis    Patient unable to remember why she needed to steroid shot but  just knows that when she got back to work she started having a lot of trouble breathing.  (jkl 05/04/14)  . Amoxicillin-Pot Clavulanate Hives and Diarrhea    Has patient had a PCN reaction causing immediate rash, facial/tongue/throat swelling, SOB or lightheadedness with hypotension: Unknown Has patient had a PCN reaction causing severe rash involving mucus membranes or skin necrosis: Unknown Has patient had a PCN reaction that required hospitalization: Unknown Has patient had a PCN reaction occurring within the last 10 years: Unknown If all of the above answers are "NO", then may proceed with Cephalosporin use.   . Aspirin Other (See Comments)    Stomach ache and bleed  . Erythromycin Diarrhea  . Latex Other (See Comments)    blisters  . Levofloxacin Other (See Comments)    Headaches, GI upset  . Nsaids Other (See Comments)    stomach bleeding per pt  . Statins Nausea Only  and Other (See Comments)    Headache, upset stomach, joint hurt, heartburn  . Sumatriptan Hives and Palpitations  . Adhesive [Tape] Other (See Comments)    blisters  . Ivp Dye [Iodinated Diagnostic Agents]     If made with blue shellfish then CAN NOT have this type of dye.   . Methylphenidate Hcl     Unknown reaction  . Mirtazapine     Unknown reaction  . Shellfish Allergy     Blue shellfish  . Soy Allergy     Unknown reaction  . Doxycycline Diarrhea  . Hydrocodone Itching  . Hydrocodone-Acetaminophen Itching  . Rofecoxib Nausea Only  . Sertraline Hcl Nausea Only  . Sulfonamide Derivatives Itching   Family History  Problem Relation Age of Onset  . Diabetes Mother   . Heart disease Father   . Diabetes Sister   . Breast cancer Maternal Grandmother   . Heart disease Maternal Uncle        x 2  . Diabetes Other        multi relatives on father side  . Heart disease Maternal Aunt   . Kidney disease Paternal Uncle        questionable  . Colon cancer Neg Hx   . Colon polyps Neg Hx   . Rectal cancer Neg Hx   . Stomach cancer Neg Hx      Past medical history, social, surgical and family history all reviewed in electronic medical record.  No pertanent information unless stated regarding to the chief complaint.   Review of Systems:Review of systems updated and as accurate as of 05/27/17  No headache, visual changes, nausea, vomiting, diarrhea, constipation, dizziness, abdominal pain, skin rash, fevers, chills, night sweats, weight loss, swollen lymph nodes joint swelling, , chest pain, shortness of breath, mood changes.  Positive body aches, muscle aches  Objective  Blood pressure 126/72, height 5' (1.524 m), weight 136 lb (61.7 kg). Systems examined below as of 05/27/17   General: No apparent distress alert and oriented x3 mood and affect normal, dressed appropriately.  HEENT: Pupils equal, extraocular movements intact  Respiratory: Patient's speak in full sentences and does  not appear short of breath  Cardiovascular: No lower extremity edema, non tender, no erythema  Skin: Warm dry intact with no signs of infection or rash on extremities or on axial skeleton.  Abdomen: Soft nontender  Neuro: Cranial nerves II through XII are intact, neurovascularly intact in all extremities with 2+ DTRs and 2+ pulses.  Lymph: No lymphadenopathy of posterior or anterior cervical chain or axillae bilaterally.  Gait normal with good balance and coordination.  MSK: Diffuse tender with full range of motion and good stability and symmetric strength and tone of shoulders, elbows, wrist, , knee and ankles bilaterally.  Mild to moderate arthritic changes.   Right hip exam shows severe tenderness over the lateral greater trochanteric area.  Mild pain over the piriformis and mild pain over the sacroiliac joint.  Procedure: Real-time Ultrasound Guided Injection of right greater trochanteric bursitis secondary to patient's body habitus Device: GE Logiq Q7 Ultrasound guided injection is preferred based studies that show increased duration, increased effect, greater accuracy, decreased procedural pain, increased response rate, and decreased cost with ultrasound guided versus blind injection.  Verbal informed consent obtained.  Time-out conducted.  Noted no overlying erythema, induration, or other signs of local infection.  Skin prepped in a sterile fashion.  Local anesthesia: Topical Ethyl chloride.  With sterile technique and under real time ultrasound guidance:  Greater trochanteric area was visualized and patient's bursa was noted. A 22-gauge 3 inch needle was inserted and 4 cc of 0.5% Marcaine and 1 cc of Kenalog 40 mg/dL was injected. Pictures taken Completed without difficulty  Pain immediately resolved suggesting accurate placement of the medication.  Advised to call if fevers/chills, erythema, induration, drainage, or persistent bleeding.  Images permanently stored and available for  review in the ultrasound unit.  Impression: Technically successful ultrasound guided injection.    Impression and Recommendations:     This case required medical decision making of moderate complexity.      Note: This dictation was prepared with Dragon dictation along with smaller phrase technology. Any transcriptional errors that result from this process are unintentional.

## 2017-05-27 NOTE — Assessment & Plan Note (Signed)
Patient given injection and tolerated the procedure well.  We discussed icing regimen and home exercises.  Discussed which activities to do which wants to avoid.  Follow-up again in 4-6 weeks

## 2017-06-16 NOTE — Progress Notes (Signed)
[name] [place],  [address] Phone: [phone] Subjective:     CC: Right hip pain  [id number]  Laura Barnes is a 77 y.o. female coming in for follow up for right hip pain. She is having pain in the hip that radiates into the front of the thigh.  Patient was seen previously and given a greater trochanteric injection.  Has been having difficulty secondary to the loss of her husband.  Noticing that the lateral side pain is somewhat better but unfortunately is having no more back pain.  Radiating into the thigh on a more regular basis.  Denies weakness but states that it continues to give her trouble.  Has had epidurals previously but is hoping to avoid this.      Past Medical History:  Diagnosis Date  . ALLERGIC RHINITIS   . Allergy   . Anemia   . ANXIETY   . Arthritis    hands  . Bronchitis   . Chronic fatigue fibromyalgia syndrome   . Complication of anesthesia    says one time waking up she couldn't breathe, felt like her throat closing up  . DEPRESSION   . Enthesopathy of hip region   . Family history of anesthesia complication    sister with n/v  . FOOT PAIN, BILATERAL   . GERD   . Hiatal hernia   . HYPERLIPIDEMIA   . Hypothyroidism   . IBS   . MIGRAINE HEADACHE   . MIGRAINE, COMMON   . NECK MASS   . OSTEOPENIA   . OSTEOPOROSIS   . PLANTAR FASCIITIS   . RASH-NONVESICULAR   . SINUSITIS- ACUTE-NOS   . SKIN LESION   . VAGINITIS   . VENOUS INSUFFICIENCY, CHRONIC    Past Surgical History:  Procedure Laterality Date  . APPENDECTOMY    . BREAST ENHANCEMENT SURGERY    . BREAST IMPLANT REMOVAL    . CHOLECYSTECTOMY    . COLONOSCOPY    . FOOT SURGERY  11/06/2016  . MASTECTOMY     bilateral for severe bilateral fibrocystic disease  . OOPHORECTOMY    . s/p neck lump removal    . SHOULDER SURGERY    . SINUS ENDO W/FUSION Right 06/30/2013   Procedure: RIGHT ENDOSCOPIC SPHENOIDECTOMY WITH FUSION SCAN;   Surgeon: [name], MD;  Location: [facility];  Service: ENT;  Laterality: Right;  . TONSILLECTOMY    . VAGINAL HYSTERECTOMY     Social History   Socioeconomic History  . Marital status: Married    Spouse name: None  . Number of children: 2  . Years of education: None  . Highest education level: None  Social Needs  . Financial resource strain: None  . Food insecurity - worry: None  . Food insecurity - inability: None  . Transportation needs - medical: None  . Transportation needs - non-medical: None  Occupational History  . Occupation: retired Press photographer  Tobacco Use  . Smoking status: Never Smoker  . Smokeless tobacco: Never Used  Substance and Sexual Activity  . Alcohol use: No  . Drug use: No  . Sexual activity: None  Other Topics Concern  . None  Social History Narrative   Has 2 biological children and 1 step child   Allergies  Allergen Reactions  . Prednisone Anaphylaxis    Patient unable to remember why she needed to steroid shot but just knows that when she got back to work she started having a lot  of trouble breathing.  (jkl 05/04/14)  . Amoxicillin-Pot Clavulanate Hives and Diarrhea    Has patient had a PCN reaction causing immediate rash, facial/tongue/throat swelling, SOB or lightheadedness with hypotension: Unknown Has patient had a PCN reaction causing severe rash involving mucus membranes or skin necrosis: Unknown Has patient had a PCN reaction that required hospitalization: Unknown Has patient had a PCN reaction occurring within the last 10 years: Unknown If all of the above answers are "NO", then may proceed with Cephalosporin use.   . Aspirin Other (See Comments)    Stomach ache and bleed  . Erythromycin Diarrhea  . Latex Other (See Comments)    blisters  . Levofloxacin Other (See Comments)    Headaches, GI upset  . Nsaids Other (See Comments)    stomach bleeding per pt  . Statins Nausea Only and Other (See Comments)    Headache, upset stomach,  joint hurt, heartburn  . Sumatriptan Hives and Palpitations  . Adhesive [Tape] Other (See Comments)    blisters  . Ivp Dye [Iodinated Diagnostic Agents]     If made with blue shellfish then CAN NOT have this type of dye.   . Methylphenidate Hcl     Unknown reaction  . Mirtazapine     Unknown reaction  . Shellfish Allergy     Blue shellfish  . Soy Allergy     Unknown reaction  . Doxycycline Diarrhea  . Hydrocodone Itching  . Hydrocodone-Acetaminophen Itching  . Rofecoxib Nausea Only  . Sertraline Hcl Nausea Only  . Sulfonamide Derivatives Itching   Family History  Problem Relation Age of Onset  . Diabetes Mother   . Heart disease Father   . Diabetes Sister   . Breast cancer Maternal Grandmother   . Heart disease Maternal Uncle        x 2  . Diabetes Other        multi relatives on father side  . Heart disease Maternal Aunt   . Kidney disease Paternal Uncle        questionable  . Colon cancer Neg Hx   . Colon polyps Neg Hx   . Rectal cancer Neg Hx   . Stomach cancer Neg Hx      Past medical history, social, surgical and family history all reviewed in electronic medical record.  No pertanent information unless stated regarding to the chief complaint.   Review of Systems:Review of systems updated and as accurate as of 06/17/17  No  visual changes, nausea, vomiting, diarrhea, constipation, dizziness, abdominal pain, skin rash, fevers, chills, night sweats, weight loss, swollen lymph nodes, , chest pain, shortness of breath, mood changes.  Positive muscle aches, joint swelling, body aches, headache  Objective  Blood pressure 122/72, pulse 87, height 5' (1.524 m), weight 138 lb (62.6 kg), SpO2 98 %. Systems examined below as of 06/17/17   General: No apparent distress alert and oriented x3 mood and affect normal, dressed appropriately.  HEENT: Pupils equal, extraocular movements intact  Respiratory: Patient's speak in full sentences and does not appear short of breath    Cardiovascular: No lower extremity edema, non tender, no erythema  Skin: Warm dry intact with no signs of infection or rash on extremities or on axial skeleton.  Abdomen: Soft nontender  Neuro: Cranial nerves II through XII are intact, neurovascularly intact in all extremities with 2+ DTRs and 2+ pulses.  Lymph: No lymphadenopathy of posterior or anterior cervical chain or axillae bilaterally.  Gait normal with good balance and  coordination.  MSK: Moderate tender with full range of motion and good stability and symmetric strength and tone of shoulders, elbows, wrist, hip, knee and ankles bilaterally.  Arthritic changes of multiple joints Back exam shows the patient continues to have difficulty overall.  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  More pain over the right sacroiliac joint than usual.  Significant decreased pain over the greater trochanteric bursa from previous exam.  Mild positive Corky Sox test still.  Mild positive straight leg test as well.  4+ out of 5 strength of the right lower extremity with plantar flexion compared to contralateral side.  Procedure: Real-time Ultrasound Guided Injection of right sacroiliac joint Device: GE Logiq Q7 Ultrasound guided injection is preferred based studies that show increased duration, increased effect, greater accuracy, decreased procedural pain, increased response rate, and decreased cost with ultrasound guided versus blind injection.  Verbal informed consent obtained.  Time-out conducted.  Noted no overlying erythema, induration, or other signs of local infection.  Skin prepped in a sterile fashion.  Local anesthesia: Topical Ethyl chloride.  With sterile technique and under real time ultrasound guidance: With a 21-gauge 2 inch needle was injected with a total of 0.5 cc of 0.5% Marcaine and 1 cc of Kenalog 40 mg/mL. Completed without difficulty  Pain immediately resolved suggesting accurate placement of the medication.   Advised to call if fevers/chills, erythema, induration, drainage, or persistent bleeding.  Images permanently stored and available for review in the ultrasound unit.  Impression: Technically successful ultrasound guided injection.    Impression and Recommendations:     This case required medical decision making of moderate complexity.      Note: This dictation was prepared with Dragon dictation along with smaller phrase technology. Any transcriptional errors that result from this process are unintentional.

## 2017-06-17 ENCOUNTER — Ambulatory Visit (INDEPENDENT_AMBULATORY_CARE_PROVIDER_SITE_OTHER): Payer: Medicare Other | Admitting: Family Medicine

## 2017-06-17 ENCOUNTER — Ambulatory Visit: Payer: Self-pay

## 2017-06-17 ENCOUNTER — Encounter: Payer: Self-pay | Admitting: Family Medicine

## 2017-06-17 VITALS — BP 122/72 | HR 87 | Ht 60.0 in | Wt 138.0 lb

## 2017-06-17 DIAGNOSIS — M25551 Pain in right hip: Secondary | ICD-10-CM

## 2017-06-17 DIAGNOSIS — M7061 Trochanteric bursitis, right hip: Secondary | ICD-10-CM | POA: Diagnosis not present

## 2017-06-17 DIAGNOSIS — M533 Sacrococcygeal disorders, not elsewhere classified: Secondary | ICD-10-CM | POA: Diagnosis not present

## 2017-06-17 NOTE — Assessment & Plan Note (Signed)
Given injection.  Tolerated the procedure well.  Discussed icing regimen and home exercise.  Discussed which activities doing which wants to avoid.  Follow-up again in 4 weeks

## 2017-06-17 NOTE — Assessment & Plan Note (Signed)
Improved from previous injection.  Still was having pain.  Does have significant fibromyalgia.  Hopefully respond to sacroiliac injection.

## 2017-06-17 NOTE — Patient Instructions (Addendum)
Good to see you  We tried an injection in the SI joint.  I hope it helps If not call  us and we can either refer yo uto [facility] again or we can consider MRI

## 2017-06-29 ENCOUNTER — Other Ambulatory Visit: Payer: Self-pay | Admitting: Internal Medicine

## 2017-07-06 ENCOUNTER — Encounter: Payer: Self-pay | Admitting: Internal Medicine

## 2017-07-06 ENCOUNTER — Ambulatory Visit: Payer: Medicare Other | Admitting: Internal Medicine

## 2017-07-06 VITALS — BP 130/74 | HR 95 | Ht 60.0 in | Wt 136.8 lb

## 2017-07-06 DIAGNOSIS — R05 Cough: Secondary | ICD-10-CM

## 2017-07-06 DIAGNOSIS — R0602 Shortness of breath: Secondary | ICD-10-CM | POA: Diagnosis not present

## 2017-07-06 DIAGNOSIS — R053 Chronic cough: Secondary | ICD-10-CM

## 2017-07-06 NOTE — Progress Notes (Signed)
Subjective:     Patient ID: Laura Barnes, female   DOB: 08/22/1940, 77 y.o.   MRN: [id number]  HPI  HPI OV 03/03/2016  Chief Complaint  Patient presents with  . Acute Visit    Pt c/o increase in SOB, dry cough, throat irritation, 2 pillow orthopnea. Pt denies CP/tightness, chest congestion, f/c/s.   Acute visit for deterioration of chronic cough  77 year old female history gained from her and review of the old chart. According to the patient she has had chronic cough for 8 years which has no etiology. In early 2016 in mid 2016 she was seen by Dr. Annamaria Boots after having failed basic therapies with the primary care physician. Allergy testing was essentially negative except for mild elevation IgE against soy. She is not using  soy anyway and since then she's completely avoided it. However this did not help the cough. In between she is tried empiric long-acting anticholinergic which has not helped. For many years she's been on Tessalon cough Perles which helps take the edge off. Her cough is present day and night. It feels as though everything is closing in on her. The cough is associated with gag. She has chronic sinus issues for which Dr. Wilburn Cornelia has done surgery in the last few months . She also has ongoing acid reflux. She used to see Dr. Sharlett Iles  GI but is on PPI currentl but has frequent breakthrough. Medication review shows she is on turmeric and also omega-3 fatty acids all of which can provoke acid reflux. She's also on PPI. She's on antihistamine and nasal steroid.  Currently she reports cough has been worse for the last few to several weeks in the last few to shortness of breath that is more a chest tightness is also present. There is no associated fever of phlegm or edema or orthopnea paroxysmal nocturnal dyspnea. Her Exhaled nitric oxide today in the office  03/03/2016 - 16ppb and normal  She has ffibromyalgia and is wondring if cough is related   Review of the chart shows last  chest x-ray was April 2016 and clear. I personally visualized and done. She's never had CT chest.  Now had pulmonary function testing.    OV 03/23/2016  Chief Complaint  Patient presents with  . Follow-up    Pt here after HRCT and PFT. Pt states her SOB is unchanged. Pt states her cough is unchanged since last OV - dry cough. Pt denies CP/tightness and f/c/s.    Here for follow-up of chronic cough. She had pulmonary function test and CT scan reflected below. Cough is not any better. She says she is tried empiric inhalers in the past and does not want try them anymore. She does not want oice rehabilitation. She is allergic to gabapentin. She believes that fish oil and turmeric are not Contributing to acid reflux.   Pulmonary function test 03/23/2016 is essentially normal except for mild reduction in diffusion capacity DLCO 14.2/75%.   IMPRESSION: 1. No evidence of interstitial lung disease. 2. Mild air trapping, indicative of mild small airways disease. 3. Aortic atherosclerosis, in addition to 2 vessel coronary artery disease. Assessment for potential risk factor modification, dietary therapy or pharmacologic therapy may be warranted, if clinically indicated. 4. Additional incidental findings, as above.   Electronically Signed   By: Vinnie Langton M.D.   On: 03/09/2016 13:44   OV 09/28/2016  Chief Complaint  Patient presents with  . Follow-up    Pt c/o increase in SOB x 1 day.  Pt states she had a panic attack last night and she could not lay flat to sleep. Pt c/o dry cough. Pt denies f/c/s.      Follow-up idiopathic cough/cough neuropathy: She continues to be bothered by daily cough. It is refractory. She is not interested in gabapentin because of previous allergy. She's not interested in voice rehabilitation. In the past she was not interested in stopping fish oil as a trial but this time she tells me that she did not know that fish oil and provoke cough and is willing  to give a time limited trial off fish oil. We discussed research participation for chronic cough and she's not interested. Because her husband has prostate cancer and her time is being taken up supporting him  New issue shortness of breath: She says that she is known to suffer from panic attacks. Last night she had a spontaneous panic attack and this morning she did feel some nonspecific shortness of breath without any associated wheezing or sputum production or fever. She feels it is slowly easing off and she thinks it is related to the panic attack. Walking desaturation test 185 feet 3 laps on RA: She only did 2 laps and did not desaturate. She stopped because tired In her legs   OV 07/06/2017  Chief Complaint  Patient presents with  . Follow-up    Pt states that she has noticed some problems with her breathing when trying to do errands and is still having problems with coughing but gets relief from tessalon pearls. Denies any CP.   Follow-up chronic refractory cough/cough neuropathy mild; last seen in April 2018.  There is a routine follow-up.  In the interim her husband passed away in Jun 04, 2017 just before Christmas.  This is from prostate cancer and also acute cholecystitis.  Her cough continues to be mild which she controls with [employer].  There is no real change in the cough  History of shortness of breath: Last visit she describes shortness of breath due to panic attack.  She now tells me that other than that shortness of breath she has new shortness of breath that has been insidious onset for the last few months.  Present on exertion and relieved by rest.  It is not getting worse it is stable.  It is unrelated to the cough.  There is no chest pain.  Walking desaturation test on 85 feet x3 laps on room air in our office with a forehead probe: walked only 2 laps and got very dyspneic. Pulse ox 1005 -> 100%, HR 80 -> 99/min    has a past medical history of ALLERGIC RHINITIS, Allergy,  Anemia, ANXIETY, Arthritis, Bronchitis, Chronic fatigue fibromyalgia syndrome, Complication of anesthesia, DEPRESSION, Enthesopathy of hip region, Family history of anesthesia complication, FOOT PAIN, BILATERAL, GERD, Hiatal hernia, HYPERLIPIDEMIA, Hypothyroidism, IBS, MIGRAINE HEADACHE, MIGRAINE, COMMON, NECK MASS, OSTEOPENIA, OSTEOPOROSIS, PLANTAR FASCIITIS, RASH-NONVESICULAR, SINUSITIS- ACUTE-NOS, SKIN LESION, VAGINITIS, and VENOUS INSUFFICIENCY, CHRONIC.   reports that  has never smoked. she has never used smokeless tobacco.  Past Surgical History:  Procedure Laterality Date  . APPENDECTOMY    . BREAST ENHANCEMENT SURGERY    . BREAST IMPLANT REMOVAL    . CHOLECYSTECTOMY    . COLONOSCOPY    . FOOT SURGERY  11/06/2016  . MASTECTOMY     bilateral for severe bilateral fibrocystic disease  . OOPHORECTOMY    . s/p neck lump removal    . SHOULDER SURGERY    . SINUS ENDO W/FUSION Right  06/30/2013   Procedure: RIGHT ENDOSCOPIC SPHENOIDECTOMY WITH FUSION SCAN;  Surgeon: [name], MD;  Location: [facility];  Service: ENT;  Laterality: Right;  . TONSILLECTOMY    . VAGINAL HYSTERECTOMY      Allergies  Allergen Reactions  . Prednisone Anaphylaxis    Patient unable to remember why she needed to steroid shot but just knows that when she got back to work she started having a lot of trouble breathing.  (jkl 05/04/14)  . Amoxicillin-Pot Clavulanate Hives and Diarrhea    Has patient had a PCN reaction causing immediate rash, facial/tongue/throat swelling, SOB or lightheadedness with hypotension: Unknown Has patient had a PCN reaction causing severe rash involving mucus membranes or skin necrosis: Unknown Has patient had a PCN reaction that required hospitalization: Unknown Has patient had a PCN reaction occurring within the last 10 years: Unknown If all of the above answers are "NO", then may proceed with Cephalosporin use.   . Aspirin Other (See Comments)    Stomach ache and bleed  .  Erythromycin Diarrhea  . Latex Other (See Comments)    blisters  . Levofloxacin Other (See Comments)    Headaches, GI upset  . Nsaids Other (See Comments)    stomach bleeding per pt  . Statins Nausea Only and Other (See Comments)    Headache, upset stomach, joint hurt, heartburn  . Sumatriptan Hives and Palpitations  . Adhesive [Tape] Other (See Comments)    blisters  . Ivp Dye [Iodinated Diagnostic Agents]     If made with blue shellfish then CAN NOT have this type of dye.   . Methylphenidate Hcl     Unknown reaction  . Mirtazapine     Unknown reaction  . Shellfish Allergy     Blue shellfish  . Soy Allergy     Unknown reaction  . Doxycycline Diarrhea  . Hydrocodone Itching  . Hydrocodone-Acetaminophen Itching  . Rofecoxib Nausea Only  . Sertraline Hcl Nausea Only  . Sulfonamide Derivatives Itching    Immunization History  Administered Date(s) Administered  . Influenza Split 04/07/2011, 03/08/2012  . Influenza Whole 03/20/2008, 04/01/2009, 04/02/2010  . Influenza, High Dose Seasonal PF 04/12/2013, 03/26/2015, 03/04/2016, 04/01/2017  . Influenza,inj,Quad PF,6+ Mos 05/01/2014  . Pneumococcal Conjugate-13 05/02/2013  . Pneumococcal Polysaccharide-23 01/25/2006  . Td 09/17/2008  . Zoster 01/25/2006    Family History  Problem Relation Age of Onset  . Diabetes Mother   . Heart disease Father   . Diabetes Sister   . Breast cancer Maternal Grandmother   . Heart disease Maternal Uncle        x 2  . Diabetes Other        multi relatives on father side  . Heart disease Maternal Aunt   . Kidney disease Paternal Uncle        questionable  . Colon cancer Neg Hx   . Colon polyps Neg Hx   . Rectal cancer Neg Hx   . Stomach cancer Neg Hx      Current Outpatient Medications:  .  5-Hydroxytryptophan (5-HTP PO), Take 1 capsule by mouth 2 (two) times daily., Disp: , Rfl:  .  acetaminophen (TYLENOL) 500 MG tablet, Take 1,000 mg by mouth every 6 (six) hours as needed for mild  pain, moderate pain, fever or headache., Disp: , Rfl:  .  ALPRAZolam (XANAX) 0.5 MG tablet, TAKE 1 TABLET BY MOUTH THREE TIMES DAILY AS NEEDED FOR ANXIETY, Disp: 90 tablet, Rfl: 0 .  Amino Acids (AMINO ACID  PO), Take 1 capsule by mouth 2 (two) times daily., Disp: , Rfl:  .  Ascorbic Acid (VITAMIN C) 1000 MG tablet, Take 1,000 mg by mouth 2 (two) times daily., Disp: , Rfl:  .  B Complex-Biotin-FA (B COMPLETE) TABS, Take 1 tablet by mouth daily. B Complete 100mg , Disp: , Rfl:  .  benzonatate (TESSALON) 200 MG capsule, TAKE 1 CAPSULE(200 MG) BY MOUTH THREE TIMES DAILY AS NEEDED FOR COUGH, Disp: 90 capsule, Rfl: 0 .  buPROPion (WELLBUTRIN XL) 300 MG 24 hr tablet, TAKE 1 TABLET BY MOUTH DAILY, Disp: 90 tablet, Rfl: 1 .  cetirizine (ZYRTEC) 10 MG tablet, TAKE 1 TABLET (10 MG TOTAL) BY MOUTH DAILY., Disp: 30 tablet, Rfl: 11 .  Cholecalciferol (VITAMIN D3) 1000 units CAPS, Take 1,000 Units by mouth daily., Disp: , Rfl:  .  Coenzyme Q10 (CO Q-10) 200 MG CAPS, Take 1 capsule by mouth 2 (two) times daily., Disp: , Rfl:  .  ECHINACEA PO, Take 1 tablet by mouth 2 (two) times daily., Disp: , Rfl:  .  estradiol (ESTRACE) 1 MG tablet, TAKE 1 TABLET BY MOUTH EVERY DAY, Disp: 90 tablet, Rfl: 0 .  FLUoxetine (PROZAC) 20 MG capsule, TAKE 1 CAPSULE BY MOUTH EVERY DAY, Disp: 90 capsule, Rfl: 1 .  HYDROmorphone (DILAUDID) 4 MG tablet, Take 1 tablet (4 mg total) every 4 (four) hours as needed by mouth for severe pain., Disp: 30 tablet, Rfl: 0 .  LECITHIN PO, Take 1 tablet by mouth daily., Disp: , Rfl:  .  levothyroxine (SYNTHROID, LEVOTHROID) 75 MCG tablet, TAKE 1 TABLET BY MOUTH EVERY DAY IN THE MORNING, Disp: 90 tablet, Rfl: 0 .  meclizine (ANTIVERT) 25 MG tablet, Take 1 tablet (25 mg total) by mouth 3 (three) times daily as needed for dizziness., Disp: 30 tablet, Rfl: 0 .  Multiple Vitamin (MULTIVITAMIN WITH MINERALS) TABS tablet, Take 1 tablet by mouth daily., Disp: , Rfl:  .  Omega-3 Fatty Acids (SALMON OIL-1000 PO),  Take 1,000 mg by mouth daily., Disp: , Rfl:  .  ondansetron (ZOFRAN) 4 MG tablet, Take 1 tablet (4 mg total) by mouth every 8 (eight) hours as needed. (Patient taking differently: Take 4 mg by mouth every 8 (eight) hours as needed for nausea or vomiting. ), Disp: 20 tablet, Rfl: 0 .  ondansetron (ZOFRAN-ODT) 8 MG disintegrating tablet, Take 1 tablet (8 mg total) every 8 (eight) hours as needed by mouth for nausea or vomiting., Disp: 30 tablet, Rfl: 0 .  ranitidine (ZANTAC) 150 MG tablet, Take 150 mg by mouth daily., Disp: , Rfl:  .  triamterene-hydrochlorothiazide (MAXZIDE-25) 37.5-25 MG tablet, TAKE 1 TABLET BY MOUTH EVERY DAY, Disp: 90 tablet, Rfl: 3 .  TURMERIC PO, Take 1 tablet by mouth 2 (two) times daily. , Disp: , Rfl:  .  vitamin E 400 UNIT capsule, Take 400 Units by mouth daily., Disp: , Rfl:   Review of Systems     Objective:   Physical Exam  Constitutional: She is oriented to person, place, and time. She appears well-developed and well-nourished. No distress.  HENT:  Head: Normocephalic and atraumatic.  Right Ear: External ear normal.  Left Ear: External ear normal.  Mouth/Throat: Oropharynx is clear and moist. No oropharyngeal exudate.  Eyes: Conjunctivae and EOM are normal. Pupils are equal, round, and reactive to light. Right eye exhibits no discharge. Left eye exhibits no discharge. No scleral icterus.  Neck: Normal range of motion. Neck supple. No JVD present. No tracheal deviation present. No  thyromegaly present.  Cardiovascular: Normal rate, regular rhythm, normal heart sounds and intact distal pulses. Exam reveals no gallop and no friction rub.  No murmur heard. Pulmonary/Chest: Effort normal and breath sounds normal. No respiratory distress. She has no wheezes. She has no rales. She exhibits no tenderness.  No crackles  Abdominal: Soft. Bowel sounds are normal. She exhibits no distension and no mass. There is no tenderness. There is no rebound and no guarding.   Musculoskeletal: Normal range of motion. She exhibits no edema or tenderness.  Lymphadenopathy:    She has no cervical adenopathy.  Neurological: She is alert and oriented to person, place, and time. She has normal reflexes. No cranial nerve deficit. She exhibits normal muscle tone. Coordination normal.  Skin: Skin is warm and dry. No rash noted. She is not diaphoretic. No erythema. No pallor.  Psychiatric: She has a normal mood and affect. Her behavior is normal. Judgment and thought content normal.  Vitals reviewed.   Vitals:   07/06/17 1124  BP: 130/74  Pulse: 95  SpO2: 97%  Weight: 136 lb 12.8 oz (62.1 kg)  Height: 5' (1.524 m)    Estimated body mass index is 26.72 kg/m as calculated from the following:   Height as of this encounter: 5' (1.524 m).   Weight as of this encounter: 136 lb 12.8 oz (62.1 kg).       Assessment:       ICD-10-CM   1. Chronic cough R05   2. Shortness of breath R06.02        Plan:      - do Pre-bd and post-bd spiro and dlco only. No lung volume  - do HRCT supine and prone  Followup - next few weeks but after above; if above negative do FeNO    Dr. Brand Males, M.D., [facility].C.P Pulmonary and Critical Care Medicine Staff Physician, [place] Director - Interstitial Lung Disease  Program  [facility] [place], [place], [address]  Pager: [phone], If no answer or between  15:00h - 7:00h: call [phone] Telephone: [phone]

## 2017-07-06 NOTE — Patient Instructions (Addendum)
Chronic cough Shortness of breath   - do Pre-bd and post-bd spiro and dlco only. No lung volume  - do HRCT supine and prone  Followup - next few weeks but after above; if above negative do FeNO

## 2017-07-06 NOTE — Addendum Note (Signed)
Addended by: Lorretta Harp on: 07/06/2017 12:23 PM   Modules accepted: Orders

## 2017-07-12 ENCOUNTER — Other Ambulatory Visit: Payer: Self-pay | Admitting: Internal Medicine

## 2017-07-12 DIAGNOSIS — F419 Anxiety disorder, unspecified: Secondary | ICD-10-CM

## 2017-07-12 NOTE — Telephone Encounter (Signed)
[place] Controlled Substance Database checked. Last filled on 05/26/17 

## 2017-07-13 ENCOUNTER — Ambulatory Visit (INDEPENDENT_AMBULATORY_CARE_PROVIDER_SITE_OTHER)
Admission: RE | Admit: 2017-07-13 | Discharge: 2017-07-13 | Disposition: A | Discharge status: Home / Self Care | Payer: Medicare Other | Source: Ambulatory Visit | Attending: Internal Medicine | Admitting: Internal Medicine

## 2017-07-13 DIAGNOSIS — R0602 Shortness of breath: Secondary | ICD-10-CM | POA: Diagnosis not present

## 2017-07-14 ENCOUNTER — Telehealth: Payer: Self-pay | Admitting: *Deleted

## 2017-07-14 DIAGNOSIS — M5416 Radiculopathy, lumbar region: Secondary | ICD-10-CM

## 2017-07-14 NOTE — Telephone Encounter (Signed)
Spoke to pt, advised her that we do not have any openings today & dr Tamala Julian recommended that we go ahead & get an MRI. Pt agreed.  MRI ordered & sent to [facility].

## 2017-07-14 NOTE — Telephone Encounter (Signed)
Copied from [place] [phone]. Topic: Appointment Scheduling - Scheduling Inquiry for Clinic >> Jul 14, 2017  8:55 AM Synthia Innocent wrote: Reason for CRM: Patient is calling requesting to be seen today by Dr Tamala Julian, for extreme back pain

## 2017-07-20 ENCOUNTER — Ambulatory Visit (INDEPENDENT_AMBULATORY_CARE_PROVIDER_SITE_OTHER): Payer: Medicare Other | Admitting: Internal Medicine

## 2017-07-20 ENCOUNTER — Encounter: Payer: Self-pay | Admitting: Family Medicine

## 2017-07-20 ENCOUNTER — Ambulatory Visit: Payer: Medicare Other | Admitting: Family Medicine

## 2017-07-20 VITALS — BP 124/88 | HR 82 | Ht 60.0 in | Wt 134.0 lb

## 2017-07-20 DIAGNOSIS — R0602 Shortness of breath: Secondary | ICD-10-CM

## 2017-07-20 DIAGNOSIS — M5416 Radiculopathy, lumbar region: Secondary | ICD-10-CM | POA: Diagnosis not present

## 2017-07-20 LAB — PULMONARY FUNCTION TEST
DL/VA % pred: 95 %
DL/VA: 4.04 ml/min/mmHg/L
DLCO unc % pred: 77 %
DLCO unc: 14.58 ml/min/mmHg
FEF 25-75 Pre: 2.16 L/sec
FEF2575-%Pred-Pre: 158 %
FEV1-%Pred-Pre: 129 %
FEV1-Pre: 2.2 L
FEV1FVC-%Pred-Pre: 108 %
FEV6-%Pred-Pre: 125 %
FEV6-Pre: 2.7 L
FEV6FVC-%Pred-Pre: 106 %
FVC-%Pred-Pre: 118 %
FVC-Pre: 2.7 L
Pre FEV1/FVC ratio: 81 %
Pre FEV6/FVC Ratio: 100 %

## 2017-07-20 MED ORDER — KETOROLAC TROMETHAMINE 60 MG/2ML IM SOLN
60.0000 mg | Freq: Once | INTRAMUSCULAR | Status: AC
  Administered 2017-07-20: 60 mg via INTRAMUSCULAR

## 2017-07-20 MED ORDER — METHYLPREDNISOLONE ACETATE 80 MG/ML IJ SUSP
80.0000 mg | Freq: Once | INTRAMUSCULAR | Status: AC
  Administered 2017-07-20: 80 mg via INTRAMUSCULAR

## 2017-07-20 MED ORDER — VENLAFAXINE HCL ER 37.5 MG PO CP24: 37.5000 mg | ORAL_CAPSULE | Freq: Every day | ORAL | 1 refills | Status: DC

## 2017-07-20 MED ORDER — BUPROPION HCL ER (XL) 150 MG PO TB24: 150.0000 mg | ORAL_TABLET | ORAL | 0 refills | Status: DC

## 2017-07-20 NOTE — Patient Instructions (Signed)
I am sorry you are hurting  Effexor 37.5 mg daily  Decrease wellbutrin to 150mg   We will get mri and likely the injection as well in the long run  See me again in 4 weeks

## 2017-07-20 NOTE — Progress Notes (Signed)
[facility] [address], [place] [address] Phone: [phone] Subjective:    I'm seeing this patient by the request  of:    CC: Back pain follow-up  CWC:[id number]  Laura Barnes is a 77 y.o. female coming in for follow up for hip pain. She has been using pain killers to alleviate her pain. She has pain in the morning and has to go back to bed until the medication takes effect. She is having a hard time getting dressed. She has also tried some easy exercises in order to alleviate her pain as well.   MRI appointment is in February 24th.        Past Medical History:  Diagnosis Date  . ALLERGIC RHINITIS   . Allergy   . Anemia   . ANXIETY   . Arthritis    hands  . Bronchitis   . Chronic fatigue fibromyalgia syndrome   . Complication of anesthesia    says one time waking up she couldn't breathe, felt like her throat closing up  . DEPRESSION   . Enthesopathy of hip region   . Family history of anesthesia complication    sister with n/v  . FOOT PAIN, BILATERAL   . GERD   . Hiatal hernia   . HYPERLIPIDEMIA   . Hypothyroidism   . IBS   . MIGRAINE HEADACHE   . MIGRAINE, COMMON   . NECK MASS   . OSTEOPENIA   . OSTEOPOROSIS   . PLANTAR FASCIITIS   . RASH-NONVESICULAR   . SINUSITIS- ACUTE-NOS   . SKIN LESION   . VAGINITIS   . VENOUS INSUFFICIENCY, CHRONIC    Past Surgical History:  Procedure Laterality Date  . APPENDECTOMY    . BREAST ENHANCEMENT SURGERY    . BREAST IMPLANT REMOVAL    . CHOLECYSTECTOMY    . COLONOSCOPY    . FOOT SURGERY  11/06/2016  . MASTECTOMY     bilateral for severe bilateral fibrocystic disease  . OOPHORECTOMY    . s/p neck lump removal    . SHOULDER SURGERY    . SINUS ENDO W/FUSION Right 06/30/2013   Procedure: RIGHT ENDOSCOPIC SPHENOIDECTOMY WITH FUSION SCAN;  Surgeon: [name], MD;  Location: [facility];  Service: ENT;  Laterality: Right;  . TONSILLECTOMY    . VAGINAL HYSTERECTOMY      Social History   Socioeconomic History  . Marital status: Married    Spouse name: Not on file  . Number of children: 2  . Years of education: Not on file  . Highest education level: Not on file  Social Needs  . Financial resource strain: Not on file  . Food insecurity - worry: Not on file  . Food insecurity - inability: Not on file  . Transportation needs - medical: Not on file  . Transportation needs - non-medical: Not on file  Occupational History  . Occupation: retired Press photographer  Tobacco Use  . Smoking status: Never Smoker  . Smokeless tobacco: Never Used  Substance and Sexual Activity  . Alcohol use: No  . Drug use: No  . Sexual activity: Not on file  Other Topics Concern  . Not on file  Social History Narrative   Has 2 biological children and 1 step child   Allergies  Allergen Reactions  . Prednisone Anaphylaxis    Patient unable to remember why she needed to steroid shot but just knows that when she got back to work she started having  a lot of trouble breathing.  (jkl 05/04/14)  . Amoxicillin-Pot Clavulanate Hives and Diarrhea    Has patient had a PCN reaction causing immediate rash, facial/tongue/throat swelling, SOB or lightheadedness with hypotension: Unknown Has patient had a PCN reaction causing severe rash involving mucus membranes or skin necrosis: Unknown Has patient had a PCN reaction that required hospitalization: Unknown Has patient had a PCN reaction occurring within the last 10 years: Unknown If all of the above answers are "NO", then may proceed with Cephalosporin use.   . Aspirin Other (See Comments)    Stomach ache and bleed  . Erythromycin Diarrhea  . Latex Other (See Comments)    blisters  . Levofloxacin Other (See Comments)    Headaches, GI upset  . Nsaids Other (See Comments)    stomach bleeding per pt  . Statins Nausea Only and Other (See Comments)    Headache, upset stomach, joint hurt, heartburn  . Sumatriptan Hives and Palpitations   . Adhesive [Tape] Other (See Comments)    blisters  . Ivp Dye [Iodinated Diagnostic Agents]     If made with blue shellfish then CAN NOT have this type of dye.   . Methylphenidate Hcl     Unknown reaction  . Mirtazapine     Unknown reaction  . Shellfish Allergy     Blue shellfish  . Soy Allergy     Unknown reaction  . Doxycycline Diarrhea  . Hydrocodone Itching  . Hydrocodone-Acetaminophen Itching  . Rofecoxib Nausea Only  . Sertraline Hcl Nausea Only  . Sulfonamide Derivatives Itching   Family History  Problem Relation Age of Onset  . Diabetes Mother   . Heart disease Father   . Diabetes Sister   . Breast cancer Maternal Grandmother   . Heart disease Maternal Uncle        x 2  . Diabetes Other        multi relatives on father side  . Heart disease Maternal Aunt   . Kidney disease Paternal Uncle        questionable  . Colon cancer Neg Hx   . Colon polyps Neg Hx   . Rectal cancer Neg Hx   . Stomach cancer Neg Hx      Past medical history, social, surgical and family history all reviewed in electronic medical record.  No pertanent information unless stated regarding to the chief complaint.   Review of Systems:Review of systems updated and as accurate as of 07/20/17  No headache, visual changes, nausea, vomiting, diarrhea, constipation, dizziness, abdominal pain, skin rash, fevers, chills, night sweats, weight loss, swollen lymph nodes,chest pain, shortness of breath, mood changes.  Positive body aches, joint aches, muscle aches  Objective  Blood pressure 124/88, pulse 82, height 5' (1.524 m), weight 134 lb (60.8 kg), SpO2 97 %.   Systems examined below as of 07/20/17   General: No apparent distress alert and oriented x3 mood and affect normal, dressed appropriately.  HEENT: Pupils equal, extraocular movements intact  Respiratory: Patient's speak in full sentences and does not appear short of breath  Cardiovascular: No lower extremity edema, non tender, no erythema   Skin: Warm dry intact with no signs of infection or rash on extremities or on axial skeleton.  Abdomen: Soft nontender  Neuro: Cranial nerves II through XII are intact, neurovascularly intact in all extremities with 2+ DTRs and 2+ pulses.  Lymph: No lymphadenopathy of posterior or anterior cervical chain or axillae bilaterally.  Gait antalgic gait n.  MSK:  Moderate tender with full range of motion and good stability and symmetric strength and tone of shoulders, elbows, wrist, hip, knee and ankles bilaterally.  Arthritic changes of multiple joints Back exam shows the patient continues to have difficulty overall.  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  More pain over the right sacroiliac joint than usual.  Significant decreased pain over the greater trochanteric bursa from previous exam.  Mild positive Corky Sox test still.  positive straight leg test as well worse than previous exam of the right side.  4+ out of 5 strength of the right lower extremity with plantar flexion compared to contralateral side    Impression and Recommendations:     This case required medical decision making of moderate complexity.      Note: This dictation was prepared with Dragon dictation along with smaller phrase technology. Any transcriptional errors that result from this process are unintentional.

## 2017-07-20 NOTE — Progress Notes (Signed)
Spirometry done today. 

## 2017-07-21 NOTE — Assessment & Plan Note (Signed)
Worsening lumbar radiculopathy.  Getting MRI to see if there is any progression of the L5-S1 disc protrusion that was seen back in 2011.  Worsening weakness noted as well as positive straight leg test.  Toradol and Depo-Medrol given today.  Patient has pain medications from another provider.  I do not feel comfortable with patient's history increasing anymore.  We discussed icing regimen.  Follow-up after the epidural.

## 2017-07-25 ENCOUNTER — Ambulatory Visit
Admission: RE | Admit: 2017-07-25 | Discharge: 2017-07-25 | Disposition: A | Discharge status: Home / Self Care | Payer: Medicare Other | Source: Ambulatory Visit | Attending: Family Medicine | Admitting: Family Medicine

## 2017-07-25 DIAGNOSIS — M5416 Radiculopathy, lumbar region: Secondary | ICD-10-CM

## 2017-07-25 DIAGNOSIS — M48061 Spinal stenosis, lumbar region without neurogenic claudication: Secondary | ICD-10-CM | POA: Diagnosis not present

## 2017-07-29 ENCOUNTER — Telehealth: Payer: Self-pay | Admitting: *Deleted

## 2017-07-29 DIAGNOSIS — M5416 Radiculopathy, lumbar region: Secondary | ICD-10-CM

## 2017-07-29 NOTE — Telephone Encounter (Signed)
Epidural ordered & sent to [facility]. Pt made aware.  

## 2017-07-29 NOTE — Telephone Encounter (Signed)
What level?

## 2017-07-29 NOTE — Telephone Encounter (Signed)
L2-3

## 2017-07-29 NOTE — Telephone Encounter (Signed)
Copied from [place]. Topic: General - Other >> Jul 28, 2017  2:43 PM Yvette Rack wrote: Reason for CRM: patient states that she has spoken with DR Tamala Julian about her MRI and he has asked if she would like to get a epidural done on her back and the patient agrees to procedure and would like for Dr Tamala Julian to set something up as soon as possible

## 2017-08-04 ENCOUNTER — Telehealth: Payer: Self-pay | Admitting: Family Medicine

## 2017-08-04 NOTE — Telephone Encounter (Signed)
Copied from [place] [id number]. Topic: General - Other >> Aug 04, 2017  9:45 AM Carolyn Stare wrote:  Pt said the below med is causing her side effects and would like a call back to discuss   [phone]    Patient is calling about this medication: venlafaxine XR (EFFEXOR XR) 37.5 MG 24 hr capsule   She states gaining weight and bruises easy. Please follow up with patient.

## 2017-08-04 NOTE — Telephone Encounter (Signed)
Spoke to pt, advised her to go ahead & stop taking the effexor. Scheduled her an appt in 2 weeks to f/u with Dr. Tamala Julian.

## 2017-08-05 ENCOUNTER — Ambulatory Visit
Admission: RE | Admit: 2017-08-05 | Discharge: 2017-08-05 | Disposition: A | Discharge status: Home / Self Care | Payer: Medicare Other | Source: Ambulatory Visit | Attending: Family Medicine | Admitting: Family Medicine

## 2017-08-05 DIAGNOSIS — M5126 Other intervertebral disc displacement, lumbar region: Secondary | ICD-10-CM | POA: Diagnosis not present

## 2017-08-05 DIAGNOSIS — M5416 Radiculopathy, lumbar region: Secondary | ICD-10-CM

## 2017-08-05 MED ORDER — IOPAMIDOL (ISOVUE-M 200) INJECTION 41%
1.0000 mL | Freq: Once | INTRAMUSCULAR | Status: AC
  Administered 2017-08-05: 1 mL via EPIDURAL

## 2017-08-05 MED ORDER — METHYLPREDNISOLONE ACETATE 40 MG/ML INJ SUSP (RADIOLOG
120.0000 mg | Freq: Once | INTRAMUSCULAR | Status: AC
  Administered 2017-08-05: 120 mg via EPIDURAL

## 2017-08-05 NOTE — Discharge Instructions (Signed)

## 2017-08-09 ENCOUNTER — Other Ambulatory Visit: Payer: Self-pay | Admitting: Internal Medicine

## 2017-08-09 DIAGNOSIS — F419 Anxiety disorder, unspecified: Secondary | ICD-10-CM

## 2017-08-10 ENCOUNTER — Ambulatory Visit: Payer: Medicare Other | Admitting: Internal Medicine

## 2017-08-10 ENCOUNTER — Encounter: Payer: Self-pay | Admitting: Internal Medicine

## 2017-08-10 VITALS — BP 130/72 | HR 99 | Ht 60.0 in | Wt 135.8 lb

## 2017-08-10 DIAGNOSIS — I2510 Atherosclerotic heart disease of native coronary artery without angina pectoris: Secondary | ICD-10-CM

## 2017-08-10 DIAGNOSIS — R05 Cough: Secondary | ICD-10-CM

## 2017-08-10 DIAGNOSIS — R053 Chronic cough: Secondary | ICD-10-CM

## 2017-08-10 DIAGNOSIS — R0602 Shortness of breath: Secondary | ICD-10-CM | POA: Diagnosis not present

## 2017-08-10 DIAGNOSIS — J387 Other diseases of larynx: Secondary | ICD-10-CM | POA: Diagnosis not present

## 2017-08-10 NOTE — Patient Instructions (Addendum)
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

## 2017-08-10 NOTE — Progress Notes (Signed)
Subjective:     Patient ID: Laura Barnes, female   DOB: [birth year], 77 y.o.   MRN: [id number]  HPI   HPI OV 03/03/2016  Chief Complaint  Patient presents with  . Acute Visit    Pt c/o increase in SOB, dry cough, throat irritation, 2 pillow orthopnea. Pt denies CP/tightness, chest congestion, f/c/s.   Acute visit for deterioration of chronic cough  77 year old female history gained from her and review of the old chart. According to the patient she has had chronic cough for 8 years which has no etiology. In early 2016 in mid 2016 she was seen by Dr. Annamaria Boots after having failed basic therapies with the primary care physician. Allergy testing was essentially negative except for mild elevation IgE against soy. She is not using  soy anyway and since then she's completely avoided it. However this did not help the cough. In between she is tried empiric long-acting anticholinergic which has not helped. For many years she's been on Tessalon cough Perles which helps take the edge off. Her cough is present day and night. It feels as though everything is closing in on her. The cough is associated with gag. She has chronic sinus issues for which Dr. Wilburn Cornelia has done surgery in the last few months . She also has ongoing acid reflux. She used to see Dr. Sharlett Iles  GI but is on PPI currentl but has frequent breakthrough. Medication review shows she is on turmeric and also omega-3 fatty acids all of which can provoke acid reflux. She's also on PPI. She's on antihistamine and nasal steroid.  Currently she reports cough has been worse for the last few to several weeks in the last few to shortness of breath that is more a chest tightness is also present. There is no associated fever of phlegm or edema or orthopnea paroxysmal nocturnal dyspnea. Her Exhaled nitric oxide today in the office  03/03/2016 - 16ppb and normal  She has ffibromyalgia and is wondring if cough is related   Review of the chart shows last  chest x-ray was April 2016 and clear. I personally visualized and done. She's never had CT chest.  Now had pulmonary function testing.    OV 03/23/2016  Chief Complaint  Patient presents with  . Follow-up    Pt here after HRCT and PFT. Pt states her SOB is unchanged. Pt states her cough is unchanged since last OV - dry cough. Pt denies CP/tightness and f/c/s.    Here for follow-up of chronic cough. She had pulmonary function test and CT scan reflected below. Cough is not any better. She says she is tried empiric inhalers in the past and does not want try them anymore. She does not want oice rehabilitation. She is allergic to gabapentin. She believes that fish oil and turmeric are not Contributing to acid reflux.   Pulmonary function test 03/23/2016 is essentially normal except for mild reduction in diffusion capacity DLCO 14.2/75%.   IMPRESSION: 1. No evidence of interstitial lung disease. 2. Mild air trapping, indicative of mild small airways disease. 3. Aortic atherosclerosis, in addition to 2 vessel coronary artery disease. Assessment for potential risk factor modification, dietary therapy or pharmacologic therapy may be warranted, if clinically indicated. 4. Additional incidental findings, as above.   Electronically Signed   By: Vinnie Langton M.D.   On: 03/09/2016 13:44   OV 09/28/2016  Chief Complaint  Patient presents with  . Follow-up    Pt c/o increase in SOB x 1  day. Pt states she had a panic attack last night and she could not lay flat to sleep. Pt c/o dry cough. Pt denies f/c/s.      Follow-up idiopathic cough/cough neuropathy: She continues to be bothered by daily cough. It is refractory. She is not interested in gabapentin because of previous allergy. She's not interested in voice rehabilitation. In the past she was not interested in stopping fish oil as a trial but this time she tells me that she did not know that fish oil and provoke cough and is willing  to give a time limited trial off fish oil. We discussed research participation for chronic cough and she's not interested. Because her husband has prostate cancer and her time is being taken up supporting him  New issue shortness of breath: She says that she is known to suffer from panic attacks. Last night she had a spontaneous panic attack and this morning she did feel some nonspecific shortness of breath without any associated wheezing or sputum production or fever. She feels it is slowly easing off and she thinks it is related to the panic attack. Walking desaturation test 185 feet 3 laps on RA: She only did 2 laps and did not desaturate. She stopped because tired In her legs   OV 07/06/2017  Chief Complaint  Patient presents with  . Follow-up    Pt states that she has noticed some problems with her breathing when trying to do errands and is still having problems with coughing but gets relief from tessalon pearls. Denies any CP.   Follow-up chronic refractory cough/cough neuropathy mild; last seen in April 2018.  There is a routine follow-up.  In the interim her husband passed away in 06/02/17 just before Christmas.  This is from prostate cancer and also acute cholecystitis.  Her cough continues to be mild which she controls with [employer]  There is no real change in the cough  History of shortness of breath: Last visit she describes shortness of breath due to panic attack.  She now tells me that other than that shortness of breath she has new shortness of breath that has been insidious onset for the last few months.  Present on exertion and relieved by rest.  It is not getting worse it is stable.  It is unrelated to the cough.  There is no chest pain.  Walking desaturation test on 85 feet x3 laps on room air in our office with a forehead probe: walked only 2 laps and got very dyspneic. Pulse ox 100% -> 100%, HR 80 -> 99/min   OV 08/10/2017  Chief Complaint  Patient presents with   . Follow-up    HRCT done 2/12 and PFT done 2/19.  Pt states her breathing has been worse due to going through a lot of stress currently and still has the complaints of cough. Denies any CP.    76 year old female here for multiple issues of shortness of breath and cough.  She had repeat pulmonary function test this is essentially normal with a slight reduction in diffusion capacity below 80%.  This is unchanged from previous evaluation.  Her shortness of breath continues unchanged present on exertion relieved by rest.  She did have a high-resolution CT chest which was repeat again no interstitial lung disease or nodules or infiltrates or cancer or pneumonia.  There is no evidence of emphysema on it.  There is notification of coronary artery calcification.  Review of the chart shows that she has  had cardiac stress test in 2006 but none since then.  She does not have a local cardiologist.  In terms of her cough she continues to suffer from significant cough.  She is intolerant to many medications.  She clearly tells me that ever since the death of her husband her anxiety levels are higher.  She has multiple somatic complaints including dizziness.  She also has intermittent sporadic nausea for which she is taking a lot of Zofran tablets.  These are leftover tablets after her husband died from cancer.  She is going to see her primary care physician about all these issues.  ....................................................................................................Marland Kitchen   Results for ANNIEBELL, BEDORE (MRN [id number]) as of 08/10/2017 12:08  Ref. Range 03/03/2016 14:00  Nitric Oxide Unknown 16   ......................................................................................................................  IMPRESSION: HRCT - Nov 2006 1. No evidence of interstitial lung disease. 2. No acute findings in the thorax to account for the patient's symptoms. 3. Aortic atherosclerosis, in addition  to 2 vessel coronary artery disease. Please note that although the presence of coronary artery calcium documents the presence of coronary artery disease, the severity of this disease and any potential stenosis cannot be assessed on this non-gated CT examination. Assessment for potential risk factor modification, dietary therapy or pharmacologic therapy may be warranted, if clinically indicated.  Aortic Atherosclerosis (ICD10-I70.0).   Electronically Signed   By: Vinnie Langton M.D.   On: 07/13/2017 15:59 .....................................................................................................................  PFT 07/20/17 - normal but for DLCO 77% which is stable since OCt 2017   has a past medical history of ALLERGIC RHINITIS, Allergy, Anemia, ANXIETY, Arthritis, Bronchitis, Chronic fatigue fibromyalgia syndrome, Complication of anesthesia, DEPRESSION, Enthesopathy of hip region, Family history of anesthesia complication, FOOT PAIN, BILATERAL, GERD, Hiatal hernia, HYPERLIPIDEMIA, Hypothyroidism, IBS, MIGRAINE HEADACHE, MIGRAINE, COMMON, NECK MASS, OSTEOPENIA, OSTEOPOROSIS, PLANTAR FASCIITIS, RASH-NONVESICULAR, SINUSITIS- ACUTE-NOS, SKIN LESION, VAGINITIS, and VENOUS INSUFFICIENCY, CHRONIC.   reports that  has never smoked. she has never used smokeless tobacco.  Past Surgical History:  Procedure Laterality Date  . APPENDECTOMY    . BREAST ENHANCEMENT SURGERY    . BREAST IMPLANT REMOVAL    . CHOLECYSTECTOMY    . COLONOSCOPY    . FOOT SURGERY  11/06/2016  . MASTECTOMY     bilateral for severe bilateral fibrocystic disease  . OOPHORECTOMY    . s/p neck lump removal    . SHOULDER SURGERY    . SINUS ENDO W/FUSION Right 06/30/2013   Procedure: RIGHT ENDOSCOPIC SPHENOIDECTOMY WITH FUSION SCAN;  Surgeon: [name], MD;  Location: [facility];  Service: ENT;  Laterality: Right;  . TONSILLECTOMY    . VAGINAL HYSTERECTOMY      Allergies  Allergen Reactions  . Prednisone  Anaphylaxis    Patient unable to remember why she needed to steroid shot but just knows that when she got back to work she started having a lot of trouble breathing.  (jkl 05/04/14)  . Amoxicillin-Pot Clavulanate Hives and Diarrhea    Has patient had a PCN reaction causing immediate rash, facial/tongue/throat swelling, SOB or lightheadedness with hypotension: Unknown Has patient had a PCN reaction causing severe rash involving mucus membranes or skin necrosis: Unknown Has patient had a PCN reaction that required hospitalization: Unknown Has patient had a PCN reaction occurring within the last 10 years: Unknown If all of the above answers are "NO", then may proceed with Cephalosporin use.   . Aspirin Other (See Comments)    Stomach ache and bleed  . Erythromycin Diarrhea  . Latex Other (See  Comments)    blisters  . Levofloxacin Other (See Comments)    Headaches, GI upset  . Nsaids Other (See Comments)    stomach bleeding per pt  . Statins Nausea Only and Other (See Comments)    Headache, upset stomach, joint hurt, heartburn  . Sumatriptan Hives and Palpitations  . Adhesive [Tape] Other (See Comments)    blisters  . Ivp Dye [Iodinated Diagnostic Agents]     If made with blue shellfish then CAN NOT have this type of dye.   . Methylphenidate Hcl     Unknown reaction  . Mirtazapine     Unknown reaction  . Shellfish Allergy     Blue shellfish  . Soy Allergy     Unknown reaction  . Doxycycline Diarrhea  . Hydrocodone Itching  . Hydrocodone-Acetaminophen Itching  . Rofecoxib Nausea Only  . Sertraline Hcl Nausea Only  . Sulfonamide Derivatives Itching    Immunization History  Administered Date(s) Administered  . Influenza Split 04/07/2011, 03/08/2012  . Influenza Whole 03/20/2008, 04/01/2009, 04/02/2010  . Influenza, High Dose Seasonal PF 04/12/2013, 03/26/2015, 03/04/2016, 04/01/2017  . Influenza,inj,Quad PF,6+ Mos 05/01/2014  . Pneumococcal Conjugate-13 05/02/2013  .  Pneumococcal Polysaccharide-23 01/25/2006  . Td 09/17/2008  . Zoster 01/25/2006    Family History  Problem Relation Age of Onset  . Diabetes Mother   . Heart disease Father   . Diabetes Sister   . Breast cancer Maternal Grandmother   . Heart disease Maternal Uncle        x 2  . Diabetes Other        multi relatives on father side  . Heart disease Maternal Aunt   . Kidney disease Paternal Uncle        questionable  . Colon cancer Neg Hx   . Colon polyps Neg Hx   . Rectal cancer Neg Hx   . Stomach cancer Neg Hx      Current Outpatient Medications:  .  5-Hydroxytryptophan (5-HTP PO), Take 1 capsule by mouth 2 (two) times daily., Disp: , Rfl:  .  acetaminophen (TYLENOL) 500 MG tablet, Take 1,000 mg by mouth every 6 (six) hours as needed for mild pain, moderate pain, fever or headache., Disp: , Rfl:  .  ALPRAZolam (XANAX) 0.5 MG tablet, Take 1 tablet (0.5 mg total) by mouth 3 (three) times daily as needed. for anxiety -- Office visit needed for further refills, Disp: 90 tablet, Rfl: 0 .  Amino Acids (AMINO ACID PO), Take 1 capsule by mouth 2 (two) times daily., Disp: , Rfl:  .  Ascorbic Acid (VITAMIN C) 1000 MG tablet, Take 1,000 mg by mouth 2 (two) times daily., Disp: , Rfl:  .  B Complex-Biotin-FA (B COMPLETE) TABS, Take 1 tablet by mouth daily. B Complete 100mg , Disp: , Rfl:  .  benzonatate (TESSALON) 200 MG capsule, TAKE 1 CAPSULE(200 MG) BY MOUTH THREE TIMES DAILY AS NEEDED FOR COUGH, Disp: 90 capsule, Rfl: 0 .  buPROPion (WELLBUTRIN XL) 150 MG 24 hr tablet, Take 1 tablet (150 mg total) by mouth every morning., Disp: 30 tablet, Rfl: 0 .  cetirizine (ZYRTEC) 10 MG tablet, TAKE 1 TABLET (10 MG TOTAL) BY MOUTH DAILY., Disp: 30 tablet, Rfl: 11 .  Cholecalciferol (VITAMIN D3) 1000 units CAPS, Take 1,000 Units by mouth daily., Disp: , Rfl:  .  Coenzyme Q10 (CO Q-10) 200 MG CAPS, Take 1 capsule by mouth 2 (two) times daily., Disp: , Rfl:  .  ECHINACEA PO, Take 1 tablet  by mouth 2 (two)  times daily., Disp: , Rfl:  .  estradiol (ESTRACE) 1 MG tablet, Take 1 tablet (1 mg total) by mouth daily. --- Office visit needed for further refills, Disp: 90 tablet, Rfl: 0 .  HYDROmorphone (DILAUDID) 4 MG tablet, Take 1 tablet (4 mg total) every 4 (four) hours as needed by mouth for severe pain., Disp: 30 tablet, Rfl: 0 .  LECITHIN PO, Take 1 tablet by mouth daily., Disp: , Rfl:  .  levothyroxine (SYNTHROID, LEVOTHROID) 75 MCG tablet, TAKE 1 TABLET BY MOUTH EVERY DAY IN THE MORNING, Disp: 90 tablet, Rfl: 0 .  meclizine (ANTIVERT) 25 MG tablet, Take 1 tablet (25 mg total) by mouth 3 (three) times daily as needed for dizziness., Disp: 30 tablet, Rfl: 0 .  Multiple Vitamin (MULTIVITAMIN WITH MINERALS) TABS tablet, Take 1 tablet by mouth daily., Disp: , Rfl:  .  Omega-3 Fatty Acids (SALMON OIL-1000 PO), Take 1,000 mg by mouth daily., Disp: , Rfl:  .  ondansetron (ZOFRAN) 4 MG tablet, Take 1 tablet (4 mg total) by mouth every 8 (eight) hours as needed. (Patient taking differently: Take 4 mg by mouth every 8 (eight) hours as needed for nausea or vomiting. ), Disp: 20 tablet, Rfl: 0 .  ondansetron (ZOFRAN-ODT) 8 MG disintegrating tablet, Take 1 tablet (8 mg total) every 8 (eight) hours as needed by mouth for nausea or vomiting., Disp: 30 tablet, Rfl: 0 .  ranitidine (ZANTAC) 150 MG tablet, Take 150 mg by mouth daily., Disp: , Rfl:  .  triamterene-hydrochlorothiazide (MAXZIDE-25) 37.5-25 MG tablet, TAKE 1 TABLET BY MOUTH EVERY DAY, Disp: 90 tablet, Rfl: 3 .  TURMERIC PO, Take 1 tablet by mouth 2 (two) times daily. , Disp: , Rfl:  .  vitamin E 400 UNIT capsule, Take 400 Units by mouth daily., Disp: , Rfl:   Review of Systems     Objective:   Physical Exam Vitals:   08/10/17 1153  BP: 130/72  Pulse: 99  SpO2: 98%  Weight: 135 lb 12.8 oz (61.6 kg)  Height: 5' (1.524 m)    General Appearance:    Looks frail, anxious, nervous, flat affect  Head:    Normocephalic, without obvious abnormality,  atraumatic  Eyes:    PERRL - yes, conjunctiva/corneas - clear      Ears:    Normal external ear canals, both ears  Nose:   NG tube - no  Throat:  ETT TUBE - no , OG tube - no  Neck:   Supple,  No enlargement/tenderness/nodules     Lungs:     Clear to auscultation bilaterally  Chest wall:    No deformity  Heart:    S1 and S2 normal, no murmur, CVP - no.  Pressors - no  Abdomen:     Soft, no masses, no organomegaly  Genitalia:    Not done  Rectal:   not done  Extremities:   Extremities- intact     Skin:   Intact in exposed areas .      Neurologic:   Sedation - none -> RASS - na . Moves all 4s - yes. CAM-ICU - neg . Orientation - x3+        Assessment:       ICD-10-CM   1. Shortness of breath R06.02   2. Coronary artery calcification seen on CAT scan I25.10 Ambulatory referral to Cardiology  3. Chronic cough R05   4. Irritable larynx J38.7  Plan:     Shortness of breath Coronary artery calcification seen on CAT scan - new  - no pulmonary cause for shortness of breath  - refer cardiology for coronary artery calcification and ensuring symptom not due to heart issue  Chronic cough Irritable larynx  - made worse by anxiety, grief etc., - prior intolerance to gabapentin -normal CT chest and PFT test feb 2019 - manage aas is - refer to voice rehab when stress levels improve   followup 9 months or sooner if needed   Dr. Brand Males, M.D., [facility].C.P Pulmonary and Critical Care Medicine Staff Physician, [place] Director - Interstitial Lung Disease  Program  Pulmonary [place] [facility] [place], [place], [address]  Pager: [phone], If no answer or between  15:00h - 7:00h: call [phone] Telephone: [phone]

## 2017-08-11 ENCOUNTER — Ambulatory Visit (INDEPENDENT_AMBULATORY_CARE_PROVIDER_SITE_OTHER): Payer: Medicare Other | Admitting: Internal Medicine

## 2017-08-11 ENCOUNTER — Encounter: Payer: Self-pay | Admitting: Internal Medicine

## 2017-08-11 ENCOUNTER — Other Ambulatory Visit: Payer: Medicare Other

## 2017-08-11 VITALS — BP 150/86 | HR 89 | Temp 98.2°F | Resp 16 | Wt 136.0 lb

## 2017-08-11 DIAGNOSIS — R110 Nausea: Secondary | ICD-10-CM | POA: Diagnosis not present

## 2017-08-11 DIAGNOSIS — R42 Dizziness and giddiness: Secondary | ICD-10-CM | POA: Diagnosis not present

## 2017-08-11 DIAGNOSIS — R350 Frequency of micturition: Secondary | ICD-10-CM | POA: Diagnosis not present

## 2017-08-11 MED ORDER — ONDANSETRON HCL 4 MG PO TABS: 4.0000 mg | ORAL_TABLET | Freq: Three times a day (TID) | ORAL | 1 refills | Status: DC | PRN

## 2017-08-11 MED ORDER — MECLIZINE HCL 25 MG PO TABS: 25.0000 mg | ORAL_TABLET | Freq: Three times a day (TID) | ORAL | 5 refills | Status: DC | PRN

## 2017-08-11 MED ORDER — ONDANSETRON 8 MG PO TBDP: 8.0000 mg | ORAL_TABLET | Freq: Three times a day (TID) | ORAL | 2 refills | Status: DC | PRN

## 2017-08-11 NOTE — Patient Instructions (Signed)
  Follow up with your ENT.     Medications reviewed and updated.  No changes recommended at this time.  Your prescription(s) have been submitted to your pharmacy. Please take as directed and contact our office if you believe you are having problem(s) with the medication(s).

## 2017-08-11 NOTE — Assessment & Plan Note (Signed)
She states she has been experiencing dizziness for the past 5-6 months, but does have chronic dizziness Takes meclizine as needed-refilled today Will rule out UTI with urinalysis, urine culture as a potential cause Given some of her other symptoms she will see ENT to make sure there are is not a sinus infection-her symptoms are not consistent and fairly mild so we will hold off on prescribing an antibiotic and she agrees

## 2017-08-11 NOTE — Assessment & Plan Note (Signed)
Experiencing nausea She does have some chronic nausea, but this has been increased recently May be related to possible UTI which we will rule out or dizziness Symptomatic treatment with antinausea medication Urinalysis, urine culture

## 2017-08-11 NOTE — Assessment & Plan Note (Signed)
Concern for possible UTI Check urinalysis, urine culture May be the cause of dizziness and not feeling well Will await urine results before prescribing anything

## 2017-08-11 NOTE — Progress Notes (Signed)
Subjective:    Patient ID: Laura Barnes, female    DOB: Sep 07, 1940, 77 y.o.   MRN: [id number]  HPI The patient is here for an acute visit.  She has a gland that is swollen in her neck on the left side.  It has been intermittently present and tender for a couple of weeks.  It is sore now.  She has had some dizziness, which as been present for 5-6 months.  She is having nausea.   She did take her anti-nausea medication and meclizine today.    She denies nasal congestion, PND.  She has intermittent sinus pain, sneezing and sore throat at times.  She has had a low grade fever.  She does have issues with her sinuses in the past and wonders if there is some sort of hidden infection.  She has arthritis pain that is worse in her left wrist.  She was concerned the dizziness was related to a UTI and was going to bring a urine sample - she forgot to bring it.  She does have a history of urinary tract infections.   Medications and allergies reviewed with patient and updated if appropriate.  Patient Active Problem List   Diagnosis Date Noted  . Sacroiliac pain 06/17/2017  . Greater trochanteric bursitis of right hip 05/27/2017  . Scalp laceration, initial encounter 04/29/2017  . Vertigo 04/29/2017  . Nonintractable headache 12/31/2016  . Dysphagia 10/28/2016  . Skin abnormalities 10/28/2016  . Preoperative clearance 10/28/2016  . Lump of skin 05/11/2016  . 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 Chronic meniscal tear of knee 03/05/2016  . Weak urinary stream 03/04/2016  . Nausea 03/04/2016  . Shortness of breath 03/03/2016  . Urinary hesitancy 02/28/2016  . Lightheadedness 12/18/2015  . Osteopenia 12/03/2015  . Thyroid nodule 11/16/2015  . Bilateral leg edema 11/05/2015  . Anterior neck pain 11/05/2015  . Postmenopausal 11/05/2015  . Subacromial bursitis 09/16/2015  . Umbilical pain 95/28/4132  . 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  Trochanteric bursitis of both hips 03/12/2015  . Subacromial impingement of left shoulder 03/12/2015  . Breast implant deflation 06/05/2014  . Hyperlipidemia 05/01/2014  . Chronic pain syndrome 05/01/2014  . Abnormal breast finding 05/01/2014  . Lumbar radiculopathy 03/27/2014  . Left shoulder pain 11/07/2013  . Degeneration of intervertebral disc of cervical region 10/10/2013  . Fibromyalgia 09/06/2013  . Bilateral shoulder pain 09/06/2013  . Increased frequency of urination 08/12/2013  . Low back pain 08/12/2013  . Chronic cough 04/12/2013  . Sinusitis, chronic 04/12/2013  . Menopausal flushing 11/15/2012  . Hypothyroidism 04/07/2012  . Facial pain 10/01/2010  . Chronic venous insufficiency 04/02/2010  . FOOT PAIN, BILATERAL 04/02/2010  . Enthesopathy of hip region 07/19/2007  . Osteoporosis 07/19/2007  . Anxiety state 01/23/2007  . Depression 01/23/2007  . Migraine 01/23/2007  . NINAR (noninfectious nonallergic rhinitis) 01/23/2007  . Gastroesophageal reflux disease 01/23/2007  . IBS 01/23/2007    Current Outpatient Medications on File Prior to Visit  Medication Sig Dispense Refill  . 5-Hydroxytryptophan (5-HTP PO) Take 1 capsule by mouth 2 (two) times daily.    Marland Kitchen acetaminophen (TYLENOL) 500 MG tablet Take 1,000 mg by mouth every 6 (six) hours as needed for mild pain, moderate pain, fever or headache.    . ALPRAZolam (XANAX) 0.5 MG tablet Take 1 tablet (0.5 mg total) by mouth 3 (three) times daily as needed. for anxiety -- Office visit needed for further refills 90 tablet 0  .  Amino Acids (AMINO ACID PO) Take 1 capsule by mouth 2 (two) times daily.    . Ascorbic Acid (VITAMIN C) 1000 MG tablet Take 1,000 mg by mouth 2 (two) times daily.    . B Complex-Biotin-FA (B COMPLETE) TABS Take 1 tablet by mouth daily. B Complete 100mg     . benzonatate (TESSALON) 200 MG capsule TAKE 1 CAPSULE(200 MG) BY MOUTH THREE TIMES DAILY AS NEEDED FOR COUGH 90 capsule 0  . buPROPion (WELLBUTRIN  XL) 150 MG 24 hr tablet Take 1 tablet (150 mg total) by mouth every morning. 30 tablet 0  . cetirizine (ZYRTEC) 10 MG tablet TAKE 1 TABLET (10 MG TOTAL) BY MOUTH DAILY. 30 tablet 11  . Cholecalciferol (VITAMIN D3) 1000 units CAPS Take 1,000 Units by mouth daily.    . Coenzyme Q10 (CO Q-10) 200 MG CAPS Take 1 capsule by mouth 2 (two) times daily.    Marland Kitchen ECHINACEA PO Take 1 tablet by mouth 2 (two) times daily.    Marland Kitchen estradiol (ESTRACE) 1 MG tablet Take 1 tablet (1 mg total) by mouth daily. --- Office visit needed for further refills 90 tablet 0  . HYDROmorphone (DILAUDID) 4 MG tablet Take 1 tablet (4 mg total) every 4 (four) hours as needed by mouth for severe pain. 30 tablet 0  . LECITHIN PO Take 1 tablet by mouth daily.    Marland Kitchen levothyroxine (SYNTHROID, LEVOTHROID) 75 MCG tablet TAKE 1 TABLET BY MOUTH EVERY DAY IN THE MORNING 90 tablet 0  . meclizine (ANTIVERT) 25 MG tablet Take 1 tablet (25 mg total) by mouth 3 (three) times daily as needed for dizziness. 30 tablet 0  . Multiple Vitamin (MULTIVITAMIN WITH MINERALS) TABS tablet Take 1 tablet by mouth daily.    . Omega-3 Fatty Acids (SALMON OIL-1000 PO) Take 1,000 mg by mouth daily.    . ondansetron (ZOFRAN) 4 MG tablet Take 1 tablet (4 mg total) by mouth every 8 (eight) hours as needed. (Patient taking differently: Take 4 mg by mouth every 8 (eight) hours as needed for nausea or vomiting. ) 20 tablet 0  . ondansetron (ZOFRAN-ODT) 8 MG disintegrating tablet Take 1 tablet (8 mg total) every 8 (eight) hours as needed by mouth for nausea or vomiting. 30 tablet 0  . ranitidine (ZANTAC) 150 MG tablet Take 150 mg by mouth daily.    Marland Kitchen triamterene-hydrochlorothiazide (MAXZIDE-25) 37.5-25 MG tablet TAKE 1 TABLET BY MOUTH EVERY DAY 90 tablet 3  . TURMERIC PO Take 1 tablet by mouth 2 (two) times daily.     . vitamin E 400 UNIT capsule Take 400 Units by mouth daily.     No current facility-administered medications on file prior to visit.     Past Medical History:   Diagnosis Date  . ALLERGIC RHINITIS   . Allergy   . Anemia   . ANXIETY   . Arthritis    hands  . Bronchitis   . Chronic fatigue fibromyalgia syndrome   . Complication of anesthesia    says one time waking up she couldn't breathe, felt like her throat closing up  . DEPRESSION   . Enthesopathy of hip region   . Family history of anesthesia complication    sister with n/v  . FOOT PAIN, BILATERAL   . GERD   . Hiatal hernia   . HYPERLIPIDEMIA   . Hypothyroidism   . IBS   . MIGRAINE HEADACHE   . MIGRAINE, COMMON   . NECK MASS   . OSTEOPENIA   .  OSTEOPOROSIS   . PLANTAR FASCIITIS   . RASH-NONVESICULAR   . SINUSITIS- ACUTE-NOS   . SKIN LESION   . VAGINITIS   . VENOUS INSUFFICIENCY, CHRONIC     Past Surgical History:  Procedure Laterality Date  . APPENDECTOMY    . BREAST ENHANCEMENT SURGERY    . BREAST IMPLANT REMOVAL    . CHOLECYSTECTOMY    . COLONOSCOPY    . FOOT SURGERY  11/06/2016  . MASTECTOMY     bilateral for severe bilateral fibrocystic disease  . OOPHORECTOMY    . s/p neck lump removal    . SHOULDER SURGERY    . SINUS ENDO W/FUSION Right 06/30/2013   Procedure: RIGHT ENDOSCOPIC SPHENOIDECTOMY WITH FUSION SCAN;  Surgeon: [name], MD;  Location: [facility];  Service: ENT;  Laterality: Right;  . TONSILLECTOMY    . VAGINAL HYSTERECTOMY      Social History   Socioeconomic History  . Marital status: Married    Spouse name: None  . Number of children: 2  . Years of education: None  . Highest education level: None  Social Needs  . Financial resource strain: None  . Food insecurity - worry: None  . Food insecurity - inability: None  . Transportation needs - medical: None  . Transportation needs - non-medical: None  Occupational History  . Occupation: retired Press photographer  Tobacco Use  . Smoking status: Never Smoker  . Smokeless tobacco: Never Used  Substance and Sexual Activity  . Alcohol use: No  . Drug use: No  . Sexual activity: None  Other  Topics Concern  . None  Social History Narrative   Has 2 biological children and 1 step child    Family History  Problem Relation Age of Onset  . Diabetes Mother   . Heart disease Father   . Diabetes Sister   . Breast cancer Maternal Grandmother   . Heart disease Maternal Uncle        x 2  . Diabetes Other        multi relatives on father side  . Heart disease Maternal Aunt   . Kidney disease Paternal Uncle        questionable  . Colon cancer Neg Hx   . Colon polyps Neg Hx   . Rectal cancer Neg Hx   . Stomach cancer Neg Hx     Review of Systems  Constitutional: Positive for fever (subjective one day).  HENT: Positive for sinus pain (intermittent), sneezing and sore throat (occ). Negative for congestion and postnasal drip.        Swollen gland on left side of neck, occasional bad taste like she has an infection in her throat  Respiratory: Positive for cough (chronic, a little worse) and shortness of breath. Negative for wheezing.   Gastrointestinal: Positive for nausea.  Genitourinary: Positive for frequency.  Neurological: Positive for dizziness. Negative for headaches.       Objective:   Vitals:   08/11/17 1111  BP: (!) 150/86  Pulse: 89  Resp: 16  Temp: 98.2 F (36.8 C)  SpO2: 97%   Wt Readings from Last 3 Encounters:  08/11/17 136 lb (61.7 kg)  08/10/17 135 lb 12.8 oz (61.6 kg)  07/20/17 134 lb (60.8 kg)   Body mass index is 26.56 kg/m.   Physical Exam  Constitutional: She appears well-developed and well-nourished. No distress.  HENT:  Head: Normocephalic and atraumatic.  Right Ear: External ear normal.  Left Ear: External ear normal.  B/l ear  canals and TM normal  Eyes: Conjunctivae are normal.  Neck: Neck supple. No tracheal deviation present. No thyromegaly present.  Tender left posterior cervical LN  Cardiovascular: Normal rate and regular rhythm.  No murmur heard. Pulmonary/Chest: Effort normal and breath sounds normal. No respiratory  distress.  Musculoskeletal: She exhibits no edema.  Lymphadenopathy:    She has no cervical adenopathy.  Skin: Skin is warm and dry. She is not diaphoretic.           Assessment & Plan:    See Problem List for Assessment and Plan of chronic medical problems.

## 2017-08-12 ENCOUNTER — Other Ambulatory Visit: Payer: Self-pay | Admitting: Family Medicine

## 2017-08-16 MED ORDER — ALPRAZOLAM 0.5 MG PO TABS: 0.5000 mg | ORAL_TABLET | Freq: Three times a day (TID) | ORAL | 0 refills | Status: DC | PRN

## 2017-08-16 NOTE — Telephone Encounter (Signed)
Attempted to call patient to see if she had discussed the need for this medication at her last visit. Left message that I would forward the request for review.

## 2017-08-16 NOTE — Telephone Encounter (Signed)
Spoke with pt to inform.  

## 2017-08-16 NOTE — Telephone Encounter (Signed)
Pt. Saw Dr. Quay Burow on 3/13 and she said she would send prescription in.  Showing was Denied and pt. Is not sure why  Please call Jentri and let her know. [phone]  She said needs today  Or send  To  [facility] [address], [address] AT [address]  [place] [address]  Phone: [phone] Fax: [phone]

## 2017-08-16 NOTE — Addendum Note (Signed)
Addended by: Terence Lux B on: 08/16/2017 01:34 PM   Modules accepted: Orders

## 2017-08-16 NOTE — Telephone Encounter (Signed)
Patient never received alprazolam which was denied on 03/11, even after she was seen on 08/11/2017. She is out of the medication and has been having trouble sleeping and panic attacks.  [facility] [phone], [address] AT [address]  [place] [address]  Phone: [phone] Fax: [phone]

## 2017-08-16 NOTE — Telephone Encounter (Signed)
[place] Controlled Substance Database checked. Last filled on 07/12/17

## 2017-08-18 ENCOUNTER — Encounter: Payer: Self-pay | Admitting: Cardiology

## 2017-08-18 ENCOUNTER — Ambulatory Visit: Payer: Medicare Other | Admitting: Cardiology

## 2017-08-18 VITALS — BP 122/78 | HR 106 | Ht 60.0 in | Wt 140.4 lb

## 2017-08-18 DIAGNOSIS — R600 Localized edema: Secondary | ICD-10-CM | POA: Diagnosis not present

## 2017-08-18 DIAGNOSIS — E785 Hyperlipidemia, unspecified: Secondary | ICD-10-CM

## 2017-08-18 DIAGNOSIS — R0609 Other forms of dyspnea: Secondary | ICD-10-CM | POA: Diagnosis not present

## 2017-08-18 DIAGNOSIS — R0602 Shortness of breath: Secondary | ICD-10-CM | POA: Diagnosis not present

## 2017-08-18 DIAGNOSIS — R0600 Dyspnea, unspecified: Secondary | ICD-10-CM

## 2017-08-18 MED ORDER — BENZONATATE 200 MG PO CAPS: ORAL_CAPSULE | ORAL | 6 refills | Status: DC

## 2017-08-18 NOTE — Patient Instructions (Signed)
Medication Instructions:  No change  Labwork: none  Testing/Procedures: Your physician has requested that you have an echocardiogram. Echocardiography is a painless test that uses sound waves to create images of your heart. It provides your doctor with information about the size and shape of your heart and how well your heart's chambers and valves are working. This procedure takes approximately one hour. There are no restrictions for this procedure.  You will be scheduled for a coronary CTA. We will provide instructions once this is scheduled.  Follow-Up: 3 months with Dr. Meda Coffee  Any Other Special Instructions Will Be Listed Below (If Applicable).   If you need a refill on your cardiac medications before your next appointment, please call your pharmacy.

## 2017-08-18 NOTE — Progress Notes (Signed)
Cardiology Office Note    Date:  08/18/2017   ID:  Laura Barnes, DOB 09-02-1940, MRN [id number]  PCP:  Binnie Rail, MD  Cardiologist:  Ena Dawley, MD   Reason for visit: dyspnea on exertion  History of Present Illness:  Laura Barnes is a 77 y.o. female was a very sweet patient, wife of my previous patient Mr. Laura Barnes who recently passed away secondary to cancer. She is coming with concern of dyspnea on exertion that she states she has had her whole life and has been worked up by pulmonary with no significant finding on pulmonary function test or chest CT however with finding of significant coronary calcifications.she has significant family history of premature coronary artery disease. She has never smoked. She feels short of breath just doing minimal distances.Denies dizziness or syncope. She has previously been tried on different statins including atorvastatin, rosuvastatin and including red yeast rice and they all caused significant muscle pain.she has been under a lot of stress with her husband's terminal disease and death.  Past Medical History:  Diagnosis Date  . ALLERGIC RHINITIS   . Allergy   . Anemia   . ANXIETY   . Arthritis    hands  . Bronchitis   . Chronic fatigue fibromyalgia syndrome   . Complication of anesthesia    says one time waking up she couldn't breathe, felt like her throat closing up  . DEPRESSION   . Enthesopathy of hip region   . Family history of anesthesia complication    sister with n/v  . FOOT PAIN, BILATERAL   . GERD   . Hiatal hernia   . HYPERLIPIDEMIA   . Hypothyroidism   . IBS   . MIGRAINE HEADACHE   . MIGRAINE, COMMON   . NECK MASS   . OSTEOPENIA   . OSTEOPOROSIS   . PLANTAR FASCIITIS   . RASH-NONVESICULAR   . SINUSITIS- ACUTE-NOS   . SKIN LESION   . VAGINITIS   . VENOUS INSUFFICIENCY, CHRONIC     Past Surgical History:  Procedure Laterality Date  . APPENDECTOMY    . BREAST ENHANCEMENT SURGERY    .  BREAST IMPLANT REMOVAL    . CHOLECYSTECTOMY    . COLONOSCOPY    . FOOT SURGERY  11/06/2016  . MASTECTOMY     bilateral for severe bilateral fibrocystic disease  . OOPHORECTOMY    . s/p neck lump removal    . SHOULDER SURGERY    . SINUS ENDO W/FUSION Right 06/30/2013   Procedure: RIGHT ENDOSCOPIC SPHENOIDECTOMY WITH FUSION SCAN;  Surgeon: [name], MD;  Location: [facility];  Service: ENT;  Laterality: Right;  . TONSILLECTOMY    . VAGINAL HYSTERECTOMY      Current Medications: Outpatient Medications Prior to Visit  Medication Sig Dispense Refill  . 5-Hydroxytryptophan (5-HTP PO) Take 1 capsule by mouth 2 (two) times daily.    Marland Kitchen acetaminophen (TYLENOL) 500 MG tablet Take 1,000 mg by mouth every 6 (six) hours as needed for mild pain, moderate pain, fever or headache.    . ALPRAZolam (XANAX) 0.5 MG tablet Take 1 tablet (0.5 mg total) by mouth 3 (three) times daily as needed. for anxiety 90 tablet 0  . Amino Acids (AMINO ACID PO) Take 1 capsule by mouth 2 (two) times daily.    . Ascorbic Acid (VITAMIN C) 1000 MG tablet Take 1,000 mg by mouth 2 (two) times daily.    . B Complex-Biotin-FA (B COMPLETE) TABS Take 1  tablet by mouth daily. B Complete 100mg     . buPROPion (WELLBUTRIN XL) 150 MG 24 hr tablet Take 1 tablet (150 mg total) by mouth every morning. 30 tablet 0  . cetirizine (ZYRTEC) 10 MG tablet TAKE 1 TABLET (10 MG TOTAL) BY MOUTH DAILY. 30 tablet 11  . Cholecalciferol (VITAMIN D3) 1000 units CAPS Take 1,000 Units by mouth daily.    . Coenzyme Q10 (CO Q-10) 200 MG CAPS Take 1 capsule by mouth 2 (two) times daily.    Marland Kitchen ECHINACEA PO Take 1 tablet by mouth 2 (two) times daily.    Marland Kitchen estradiol (ESTRACE) 1 MG tablet Take 1 tablet (1 mg total) by mouth daily. --- Office visit needed for further refills 90 tablet 0  . HYDROmorphone (DILAUDID) 4 MG tablet Take 1 tablet (4 mg total) every 4 (four) hours as needed by mouth for severe pain. 30 tablet 0  . LECITHIN PO Take 1 tablet by mouth daily.     Marland Kitchen levothyroxine (SYNTHROID, LEVOTHROID) 75 MCG tablet TAKE 1 TABLET BY MOUTH EVERY DAY IN THE MORNING 90 tablet 0  . meclizine (ANTIVERT) 25 MG tablet Take 1 tablet (25 mg total) by mouth 3 (three) times daily as needed for dizziness. 30 tablet 5  . Multiple Vitamin (MULTIVITAMIN WITH MINERALS) TABS tablet Take 1 tablet by mouth daily.    . Omega-3 Fatty Acids (SALMON OIL-1000 PO) Take 1,000 mg by mouth daily.    . ondansetron (ZOFRAN) 4 MG tablet Take 1 tablet (4 mg total) by mouth every 8 (eight) hours as needed for nausea or vomiting. 30 tablet 1  . ondansetron (ZOFRAN-ODT) 8 MG disintegrating tablet Take 1 tablet (8 mg total) by mouth every 8 (eight) hours as needed for nausea or vomiting. 30 tablet 2  . ranitidine (ZANTAC) 150 MG tablet Take 150 mg by mouth daily.    Marland Kitchen triamterene-hydrochlorothiazide (MAXZIDE-25) 37.5-25 MG tablet TAKE 1 TABLET BY MOUTH EVERY DAY 90 tablet 3  . TURMERIC PO Take 1 tablet by mouth 2 (two) times daily.     . vitamin E 400 UNIT capsule Take 400 Units by mouth daily.    . benzonatate (TESSALON) 200 MG capsule TAKE 1 CAPSULE(200 MG) BY MOUTH THREE TIMES DAILY AS NEEDED FOR COUGH 90 capsule 0   No facility-administered medications prior to visit.      Allergies:   Prednisone; Amoxicillin-pot clavulanate; Aspirin; Erythromycin; Latex; Levofloxacin; Nsaids; Statins; Sumatriptan; Adhesive [tape]; Ivp dye [iodinated diagnostic agents]; Methylphenidate hcl; Mirtazapine; Shellfish allergy; Soy allergy; Doxycycline; Hydrocodone; Hydrocodone-acetaminophen; Rofecoxib; Sertraline hcl; and Sulfonamide derivatives   Social History   Socioeconomic History  . Marital status: Married    Spouse name: None  . Number of children: 2  . Years of education: None  . Highest education level: None  Social Needs  . Financial resource strain: None  . Food insecurity - worry: None  . Food insecurity - inability: None  . Transportation needs - medical: None  . Transportation  needs - non-medical: None  Occupational History  . Occupation: retired Press photographer  Tobacco Use  . Smoking status: Never Smoker  . Smokeless tobacco: Never Used  Substance and Sexual Activity  . Alcohol use: No  . Drug use: No  . Sexual activity: None  Other Topics Concern  . None  Social History Narrative   Has 2 biological children and 1 step child     Family History:  The patient's family history includes Breast cancer in her maternal grandmother; Diabetes in her  mother, other, and sister; Heart disease in her father, maternal aunt, and maternal uncle; Kidney disease in her paternal uncle.   ROS:   Please see the history of present illness.    ROS All other systems reviewed and are negative.   PHYSICAL EXAM:   VS:  BP 122/78   Pulse (!) 106   Ht 5' (1.524 m)   Wt 140 lb 6.4 oz (63.7 kg)   SpO2 97%   BMI 27.42 kg/m    GEN: Well nourished, well developed, in no acute distress  HEENT: normal  Neck: no JVD, carotid bruits, or masses Cardiac: RRR; no murmurs, rubs, or gallops,mild B/L edema  Respiratory:  clear to auscultation bilaterally, normal work of breathing GI: soft, nontender, nondistended, + BS MS: no deformity or atrophy  Skin: warm and dry, no rash Neuro:  Alert and Oriented x 3, Strength and sensation are intact Psych: euthymic mood, full affect  Wt Readings from Last 3 Encounters:  08/18/17 140 lb 6.4 oz (63.7 kg)  08/11/17 136 lb (61.7 kg)  08/10/17 135 lb 12.8 oz (61.6 kg)    Studies/Labs Reviewed:   EKG:  EKG is ordered today.  The ekg ordered today demonstrates Normal sinus rhythm, normal EKG.  Recent Labs: 09/16/2016: ALT 15 04/01/2017: TSH 0.55 04/23/2017: BUN 11; Creatinine, Ser 0.84; Hemoglobin 11.9; Platelets 264; Potassium 4.7; Sodium 134   Lipid Panel    Component Value Date/Time   CHOL 193 09/16/2016 1057   TRIG 93.0 09/16/2016 1057   HDL 75.70 09/16/2016 1057   CHOLHDL 3 09/16/2016 1057   VLDL 18.6 09/16/2016 1057   LDLCALC 99  09/16/2016 1057   LDLDIRECT 119.6 04/06/2013 1228    Additional studies/ records that were reviewed today include:     ASSESSMENT:    1. SOB (shortness of breath)   2. DOE (dyspnea on exertion)   3. Hyperlipidemia, unspecified hyperlipidemia type   4. Lower extremity edema      PLAN:  In order of problems listed above:  1. Dyspnea on exertion, with known hyperlipidemia and known significant coronary calcifications, we'll plan for coronary CTA to further evaluate for obstructive coronary artery disease. 2. Hyperlipidemia -borderline however with evidence for coronary artery disease and intolerance to statins, based on results we will refer to the lipid clinic. 3. Lower extremity edema - chronic, somewhat controlled on hydrochlorothiazide, we will obtain echocardiogram to evaluate for LV systolic and diastolic function.    Medication Adjustments/Labs and Tests Ordered: Current medicines are reviewed at length with the patient today.  Concerns regarding medicines are outlined above.  Medication changes, Labs and Tests ordered today are listed in the Patient Instructions below. Patient Instructions  Medication Instructions:  No change  Labwork: none  Testing/Procedures: Your physician has requested that you have an echocardiogram. Echocardiography is a painless test that uses sound waves to create images of your heart. It provides your doctor with information about the size and shape of your heart and how well your heart's chambers and valves are working. This procedure takes approximately one hour. There are no restrictions for this procedure.  You will be scheduled for a coronary CTA. We will provide instructions once this is scheduled.  Follow-Up: 3 months with Dr. Meda Coffee  Any Other Special Instructions Will Be Listed Below (If Applicable).   If you need a refill on your cardiac medications before your next appointment, please call your pharmacy.       Signed, Ena Dawley, MD  08/18/2017 5:58 PM  [facility] [address], [place], [place]  [address] Phone: [phone]; Fax: [phone]

## 2017-08-19 ENCOUNTER — Ambulatory Visit: Payer: Medicare Other | Admitting: Family Medicine

## 2017-08-19 ENCOUNTER — Encounter: Payer: Self-pay | Admitting: Family Medicine

## 2017-08-19 DIAGNOSIS — M5416 Radiculopathy, lumbar region: Secondary | ICD-10-CM

## 2017-08-19 NOTE — Progress Notes (Signed)
[name] [place], [place] [address] Phone: [phone] Subjective:     CC: Back pain follow-up  [id number]  Laura Barnes is a 77 y.o. female coming in with complaint of back pain follow-up.  Patient was having severe amount of back pain.  Seem to be out of proportion for physical findings.  Patient did have an MRI of the lumbar spine that was independently visualized by me.  MRI showed multiple levels of degenerative disc disease with a large right lateral recess and foraminal disc protrusion at L2-L3.  Patient undergone a epidural injection on August 05, 2017.  Patient states feels significantly better.  Patient states that this is the best her back is felt in quite some time.  Patient states that it felt better immediately after the injection.     Past Medical History:  Diagnosis Date  . ALLERGIC RHINITIS   . Allergy   . Anemia   . ANXIETY   . Arthritis    hands  . Bronchitis   . Chronic fatigue fibromyalgia syndrome   . Complication of anesthesia    says one time waking up she couldn't breathe, felt like her throat closing up  . DEPRESSION   . Enthesopathy of hip region   . Family history of anesthesia complication    sister with n/v  . FOOT PAIN, BILATERAL   . GERD   . Hiatal hernia   . HYPERLIPIDEMIA   . Hypothyroidism   . IBS   . MIGRAINE HEADACHE   . MIGRAINE, COMMON   . NECK MASS   . OSTEOPENIA   . OSTEOPOROSIS   . PLANTAR FASCIITIS   . RASH-NONVESICULAR   . SINUSITIS- ACUTE-NOS   . SKIN LESION   . VAGINITIS   . VENOUS INSUFFICIENCY, CHRONIC    Past Surgical History:  Procedure Laterality Date  . APPENDECTOMY    . BREAST ENHANCEMENT SURGERY    . BREAST IMPLANT REMOVAL    . CHOLECYSTECTOMY    . COLONOSCOPY    . FOOT SURGERY  11/06/2016  . MASTECTOMY     bilateral for severe bilateral fibrocystic disease  . OOPHORECTOMY    . s/p neck lump removal    . SHOULDER SURGERY    . SINUS ENDO W/FUSION  Right 06/30/2013   Procedure: RIGHT ENDOSCOPIC SPHENOIDECTOMY WITH FUSION SCAN;  Surgeon: [name], MD;  Location: [facility];  Service: ENT;  Laterality: Right;  . TONSILLECTOMY    . VAGINAL HYSTERECTOMY     Social History   Socioeconomic History  . Marital status: Married    Spouse name: Not on file  . Number of children: 2  . Years of education: Not on file  . Highest education level: Not on file  Occupational History  . Occupation: retired Water quality scientist  . Financial resource strain: Not on file  . Food insecurity:    Worry: Not on file    Inability: Not on file  . Transportation needs:    Medical: Not on file    Non-medical: Not on file  Tobacco Use  . Smoking status: Never Smoker  . Smokeless tobacco: Never Used  Substance and Sexual Activity  . Alcohol use: No  . Drug use: No  . Sexual activity: Not on file  Lifestyle  . Physical activity:    Days per week: Not on file    Minutes per session: Not on file  . Stress: Not on file  Relationships  .  Social connections:    Talks on phone: Not on file    Gets together: Not on file    Attends religious service: Not on file    Active member of club or organization: Not on file    Attends meetings of clubs or organizations: Not on file    Relationship status: Not on file  Other Topics Concern  . Not on file  Social History Narrative   Has 2 biological children and 1 step child   Allergies  Allergen Reactions  . Prednisone Anaphylaxis    Patient unable to remember why she needed to steroid shot but just knows that when she got back to work she started having a lot of trouble breathing.  (jkl 05/04/14)  . Amoxicillin-Pot Clavulanate Hives and Diarrhea    Has patient had a PCN reaction causing immediate rash, facial/tongue/throat swelling, SOB or lightheadedness with hypotension: Unknown Has patient had a PCN reaction causing severe rash involving mucus membranes or skin necrosis: Unknown Has patient had a  PCN reaction that required hospitalization: Unknown Has patient had a PCN reaction occurring within the last 10 years: Unknown If all of the above answers are "NO", then may proceed with Cephalosporin use.   . Aspirin Other (See Comments)    Stomach ache and bleed  . Erythromycin Diarrhea  . Latex Other (See Comments)    blisters  . Levofloxacin Other (See Comments)    Headaches, GI upset  . Nsaids Other (See Comments)    stomach bleeding per pt  . Statins Nausea Only and Other (See Comments)    Headache, upset stomach, joint hurt, heartburn  . Sumatriptan Hives and Palpitations  . Adhesive [Tape] Other (See Comments)    blisters  . Ivp Dye [Iodinated Diagnostic Agents]     If made with blue shellfish then CAN NOT have this type of dye.   . Methylphenidate Hcl     Unknown reaction  . Mirtazapine     Unknown reaction  . Shellfish Allergy     Blue shellfish  . Soy Allergy     Unknown reaction  . Doxycycline Diarrhea  . Hydrocodone Itching  . Hydrocodone-Acetaminophen Itching  . Rofecoxib Nausea Only  . Sertraline Hcl Nausea Only  . Sulfonamide Derivatives Itching   Family History  Problem Relation Age of Onset  . Diabetes Mother   . Heart disease Father   . Diabetes Sister   . Breast cancer Maternal Grandmother   . Heart disease Maternal Uncle        x 2  . Diabetes Other        multi relatives on father side  . Heart disease Maternal Aunt   . Kidney disease Paternal Uncle        questionable  . Colon cancer Neg Hx   . Colon polyps Neg Hx   . Rectal cancer Neg Hx   . Stomach cancer Neg Hx      Past medical history, social, surgical and family history all reviewed in electronic medical record.  No pertanent information unless stated regarding to the chief complaint.   Review of Systems:Review of systems updated and as accurate as of 08/19/17  No headache, visual changes, nausea, vomiting, diarrhea, constipation, dizziness, abdominal pain, skin rash, fevers,  chills, night sweats, weight loss, swollen lymph nodes, body aches, joint swelling,  chest pain, shortness of breath, mood changes.  Positive muscle aches  Objective  Blood pressure 138/80, pulse 97, height 5' (1.524 m), weight 140 lb (63.5  kg), SpO2 98 %. Systems examined below as of 08/19/17   General: No apparent distress alert and oriented x3 mood and affect normal, dressed appropriately.  HEENT: Pupils equal, extraocular movements intact  Respiratory: Patient's speak in full sentences and does not appear short of breath  Cardiovascular: No lower extremity edema, non tender, no erythema  Skin: Warm dry intact with no signs of infection or rash on extremities or on axial skeleton.  Abdomen: Soft nontender  Neuro: Cranial nerves II through XII are intact, neurovascularly intact in all extremities with 2+ DTRs and 2+ pulses.  Lymph: No lymphadenopathy of posterior or anterior cervical chain or axillae bilaterally.  Gait normal with good balance and coordination.  MSK:  tender with full range of motion and good stability and symmetric strength and tone of shoulders, elbows, wrist, hip, knee and ankles bilaterally.  Diffuse arthritic changes of multiple joints  Back exam shows some mild tenderness to palpation over the paraspinal musculature lumbar spine.  Patient has a negative straight leg test but significant tightness of the hamstrings bilaterally.  Range of motion is decreased by 50 degrees in all planes secondary to some discomfort.  Unable to do [facility] test secondary to tightness.    Impression and Recommendations:     This case required medical decision making of moderate complexity.      Note: This dictation was prepared with Dragon dictation along with smaller phrase technology. Any transcriptional errors that result from this process are unintentional.

## 2017-08-19 NOTE — Assessment & Plan Note (Signed)
Significant improvement after the injection.  Discussed icing regimen and home exercises.  Discussed which activities to do which wants to avoid.  Increase activity as tolerated.  Follow-up in 2 months

## 2017-08-19 NOTE — Patient Instructions (Signed)
Good to see you  Laura Barnes M.D was who did your injection  Copper gloves would be good (tommy copper or copper fit)  If the thumbs hurt more can do injection  If back acts up call me and I can order the injection  Otherwise see me again in 2 months

## 2017-08-20 ENCOUNTER — Other Ambulatory Visit: Payer: Self-pay | Admitting: Family Medicine

## 2017-08-23 DIAGNOSIS — H33302 Unspecified retinal break, left eye: Secondary | ICD-10-CM | POA: Diagnosis not present

## 2017-08-23 DIAGNOSIS — H353131 Nonexudative age-related macular degeneration, bilateral, early dry stage: Secondary | ICD-10-CM | POA: Diagnosis not present

## 2017-08-23 DIAGNOSIS — H5203 Hypermetropia, bilateral: Secondary | ICD-10-CM | POA: Diagnosis not present

## 2017-08-23 DIAGNOSIS — H2513 Age-related nuclear cataract, bilateral: Secondary | ICD-10-CM | POA: Diagnosis not present

## 2017-08-24 DIAGNOSIS — H43813 Vitreous degeneration, bilateral: Secondary | ICD-10-CM | POA: Diagnosis not present

## 2017-08-24 DIAGNOSIS — H353121 Nonexudative age-related macular degeneration, left eye, early dry stage: Secondary | ICD-10-CM | POA: Diagnosis not present

## 2017-08-24 DIAGNOSIS — H33302 Unspecified retinal break, left eye: Secondary | ICD-10-CM | POA: Diagnosis not present

## 2017-08-24 DIAGNOSIS — H353111 Nonexudative age-related macular degeneration, right eye, early dry stage: Secondary | ICD-10-CM | POA: Diagnosis not present

## 2017-08-26 DIAGNOSIS — M9903 Segmental and somatic dysfunction of lumbar region: Secondary | ICD-10-CM | POA: Diagnosis not present

## 2017-08-26 DIAGNOSIS — M9905 Segmental and somatic dysfunction of pelvic region: Secondary | ICD-10-CM | POA: Diagnosis not present

## 2017-08-26 DIAGNOSIS — M5136 Other intervertebral disc degeneration, lumbar region: Secondary | ICD-10-CM | POA: Diagnosis not present

## 2017-08-26 DIAGNOSIS — M9904 Segmental and somatic dysfunction of sacral region: Secondary | ICD-10-CM | POA: Diagnosis not present

## 2017-08-27 ENCOUNTER — Ambulatory Visit (HOSPITAL_COMMUNITY): Payer: Medicare Other | Attending: Cardiovascular Disease

## 2017-08-27 ENCOUNTER — Other Ambulatory Visit: Payer: Self-pay

## 2017-08-27 DIAGNOSIS — R0609 Other forms of dyspnea: Secondary | ICD-10-CM

## 2017-08-27 DIAGNOSIS — R609 Edema, unspecified: Secondary | ICD-10-CM | POA: Insufficient documentation

## 2017-08-27 DIAGNOSIS — E785 Hyperlipidemia, unspecified: Secondary | ICD-10-CM | POA: Diagnosis not present

## 2017-08-27 DIAGNOSIS — R0602 Shortness of breath: Secondary | ICD-10-CM | POA: Diagnosis not present

## 2017-08-27 DIAGNOSIS — R0600 Dyspnea, unspecified: Secondary | ICD-10-CM

## 2017-08-27 DIAGNOSIS — M797 Fibromyalgia: Secondary | ICD-10-CM | POA: Diagnosis not present

## 2017-08-27 DIAGNOSIS — R600 Localized edema: Secondary | ICD-10-CM

## 2017-08-30 DIAGNOSIS — M9905 Segmental and somatic dysfunction of pelvic region: Secondary | ICD-10-CM | POA: Diagnosis not present

## 2017-08-30 DIAGNOSIS — M9904 Segmental and somatic dysfunction of sacral region: Secondary | ICD-10-CM | POA: Diagnosis not present

## 2017-08-30 DIAGNOSIS — M5136 Other intervertebral disc degeneration, lumbar region: Secondary | ICD-10-CM | POA: Diagnosis not present

## 2017-08-30 DIAGNOSIS — M9903 Segmental and somatic dysfunction of lumbar region: Secondary | ICD-10-CM | POA: Diagnosis not present

## 2017-09-06 NOTE — Progress Notes (Deleted)
[facility] [place],  [address] Phone: [phone] Subjective:    I'm seeing this patient by the request  of:    CC:   [id number]  Laura Barnes is a 77 y.o. female coming in with complaint of ***  Onset-  Location Duration-  Character- Aggravating factors- Reliving factors-  Therapies tried-  Severity-     Past Medical History:  Diagnosis Date  . ALLERGIC RHINITIS   . Allergy   . Anemia   . ANXIETY   . Arthritis    hands  . Bronchitis   . Chronic fatigue fibromyalgia syndrome   . Complication of anesthesia    says one time waking up she couldn't breathe, felt like her throat closing up  . DEPRESSION   . Enthesopathy of hip region   . Family history of anesthesia complication    sister with n/v  . FOOT PAIN, BILATERAL   . GERD   . Hiatal hernia   . HYPERLIPIDEMIA   . Hypothyroidism   . IBS   . MIGRAINE HEADACHE   . MIGRAINE, COMMON   . NECK MASS   . OSTEOPENIA   . OSTEOPOROSIS   . PLANTAR FASCIITIS   . RASH-NONVESICULAR   . SINUSITIS- ACUTE-NOS   . SKIN LESION   . VAGINITIS   . VENOUS INSUFFICIENCY, CHRONIC    Past Surgical History:  Procedure Laterality Date  . APPENDECTOMY    . BREAST ENHANCEMENT SURGERY    . BREAST IMPLANT REMOVAL    . CHOLECYSTECTOMY    . COLONOSCOPY    . FOOT SURGERY  11/06/2016  . MASTECTOMY     bilateral for severe bilateral fibrocystic disease  . OOPHORECTOMY    . s/p neck lump removal    . SHOULDER SURGERY    . SINUS ENDO W/FUSION Right 06/30/2013   Procedure: RIGHT ENDOSCOPIC SPHENOIDECTOMY WITH FUSION SCAN;  Surgeon: [name], MD;  Location: [facility];  Service: ENT;  Laterality: Right;  . TONSILLECTOMY    . VAGINAL HYSTERECTOMY     Social History   Socioeconomic History  . Marital status: Married    Spouse name: Not on file  . Number of children: 2  . Years of education: Not on file  . Highest education level: Not on file  Occupational History    . Occupation: retired Water quality scientist  . Financial resource strain: Not on file  . Food insecurity:    Worry: Not on file    Inability: Not on file  . Transportation needs:    Medical: Not on file    Non-medical: Not on file  Tobacco Use  . Smoking status: Never Smoker  . Smokeless tobacco: Never Used  Substance and Sexual Activity  . Alcohol use: No  . Drug use: No  . Sexual activity: Not on file  Lifestyle  . Physical activity:    Days per week: Not on file    Minutes per session: Not on file  . Stress: Not on file  Relationships  . Social connections:    Talks on phone: Not on file    Gets together: Not on file    Attends religious service: Not on file    Active member of club or organization: Not on file    Attends meetings of clubs or organizations: Not on file    Relationship status: Not on file  Other Topics Concern  . Not on file  Social History Narrative  Has 2 biological children and 1 step child   Allergies  Allergen Reactions  . Prednisone Anaphylaxis    Patient unable to remember why she needed to steroid shot but just knows that when she got back to work she started having a lot of trouble breathing.  (jkl 05/04/14)  . Amoxicillin-Pot Clavulanate Hives and Diarrhea    Has patient had a PCN reaction causing immediate rash, facial/tongue/throat swelling, SOB or lightheadedness with hypotension: Unknown Has patient had a PCN reaction causing severe rash involving mucus membranes or skin necrosis: Unknown Has patient had a PCN reaction that required hospitalization: Unknown Has patient had a PCN reaction occurring within the last 10 years: Unknown If all of the above answers are "NO", then may proceed with Cephalosporin use.   . Aspirin Other (See Comments)    Stomach ache and bleed  . Erythromycin Diarrhea  . Latex Other (See Comments)    blisters  . Levofloxacin Other (See Comments)    Headaches, GI upset  . Nsaids Other (See Comments)     stomach bleeding per pt  . Statins Nausea Only and Other (See Comments)    Headache, upset stomach, joint hurt, heartburn  . Sumatriptan Hives and Palpitations  . Adhesive [Tape] Other (See Comments)    blisters  . Ivp Dye [Iodinated Diagnostic Agents]     If made with blue shellfish then CAN NOT have this type of dye.   . Methylphenidate Hcl     Unknown reaction  . Mirtazapine     Unknown reaction  . Shellfish Allergy     Blue shellfish  . Soy Allergy     Unknown reaction  . Doxycycline Diarrhea  . Hydrocodone Itching  . Hydrocodone-Acetaminophen Itching  . Rofecoxib Nausea Only  . Sertraline Hcl Nausea Only  . Sulfonamide Derivatives Itching   Family History  Problem Relation Age of Onset  . Diabetes Mother   . Heart disease Father   . Diabetes Sister   . Breast cancer Maternal Grandmother   . Heart disease Maternal Uncle        x 2  . Diabetes Other        multi relatives on father side  . Heart disease Maternal Aunt   . Kidney disease Paternal Uncle        questionable  . Colon cancer Neg Hx   . Colon polyps Neg Hx   . Rectal cancer Neg Hx   . Stomach cancer Neg Hx      Past medical history, social, surgical and family history all reviewed in electronic medical record.  No pertanent information unless stated regarding to the chief complaint.   Review of Systems:Review of systems updated and as accurate as of 09/06/17  No headache, visual changes, nausea, vomiting, diarrhea, constipation, dizziness, abdominal pain, skin rash, fevers, chills, night sweats, weight loss, swollen lymph nodes, body aches, joint swelling, muscle aches, chest pain, shortness of breath, mood changes.   Objective  There were no vitals taken for this visit. Systems examined below as of 09/06/17   General: No apparent distress alert and oriented x3 mood and affect normal, dressed appropriately.  HEENT: Pupils equal, extraocular movements intact  Respiratory: Patient's speak in full  sentences and does not appear short of breath  Cardiovascular: No lower extremity edema, non tender, no erythema  Skin: Warm dry intact with no signs of infection or rash on extremities or on axial skeleton.  Abdomen: Soft nontender  Neuro: Cranial nerves II through XII  are intact, neurovascularly intact in all extremities with 2+ DTRs and 2+ pulses.  Lymph: No lymphadenopathy of posterior or anterior cervical chain or axillae bilaterally.  Gait normal with good balance and coordination.  MSK:  Non tender with full range of motion and good stability and symmetric strength and tone of shoulders, elbows, wrist, hip, knee and ankles bilaterally.     Impression and Recommendations:     This case required medical decision making of moderate complexity.      Note: This dictation was prepared with Dragon dictation along with smaller phrase technology. Any transcriptional errors that result from this process are unintentional.

## 2017-09-07 ENCOUNTER — Ambulatory Visit: Payer: Medicare Other | Admitting: Family Medicine

## 2017-09-07 DIAGNOSIS — Z0289 Encounter for other administrative examinations: Secondary | ICD-10-CM

## 2017-09-12 NOTE — Patient Instructions (Addendum)
Test(s) ordered today. Your results will be released to [place] [facility] (or called to you) after review, usually within 72hours after test completion. If any changes need to be made, you will be notified at that same time.  All other Health Maintenance issues reviewed.   All recommended immunizations and age-appropriate screenings are up-to-date or discussed.  No immunizations administered today.   Medications reviewed and updated.  No changes recommended at this time.  Your prescription(s) have been submitted to your pharmacy. Please take as directed and contact our office if you believe you are having problem(s) with the medication(s).   Please followup in 6 months   Health Maintenance, Female Adopting a healthy lifestyle and getting preventive care can go a long way to promote health and wellness. Talk with your health care provider about what schedule of regular examinations is right for you. This is a good chance for you to check in with your provider about disease prevention and staying healthy. In between checkups, there are plenty of things you can do on your own. Experts have done a lot of research about which lifestyle changes and preventive measures are most likely to keep you healthy. Ask your health care provider for more information. Weight and diet Eat a healthy diet  Be sure to include plenty of vegetables, fruits, low-fat dairy products, and lean protein.  Do not eat a lot of foods high in solid fats, added sugars, or salt.  Get regular exercise. This is one of the most important things you can do for your health. ? Most adults should exercise for at least 150 minutes each week. The exercise should increase your heart rate and make you sweat (moderate-intensity exercise). ? Most adults should also do strengthening exercises at least twice a week. This is in addition to the moderate-intensity exercise.  Maintain a healthy weight  Body mass index (BMI) is a measurement that can  be used to identify possible weight problems. It estimates body fat based on height and weight. Your health care provider can help determine your BMI and help you achieve or maintain a healthy weight.  For females 13 years of age and older: ? A BMI below 18.5 is considered underweight. ? A BMI of 18.5 to 24.9 is normal. ? A BMI of 25 to 29.9 is considered overweight. ? A BMI of 30 and above is considered obese.  Watch levels of cholesterol and blood lipids  You should start having your blood tested for lipids and cholesterol at 77 years of age, then have this test every 5 years.  You may need to have your cholesterol levels checked more often if: ? Your lipid or cholesterol levels are high. ? You are older than 77 years of age. ? You are at high risk for heart disease.  Cancer screening Lung Cancer  Lung cancer screening is recommended for adults 54-15 years old who are at high risk for lung cancer because of a history of smoking.  A yearly low-dose CT scan of the lungs is recommended for people who: ? Currently smoke. ? Have quit within the past 15 years. ? Have at least a 30-pack-year history of smoking. A pack year is smoking an average of one pack of cigarettes a day for 1 year.  Yearly screening should continue until it has been 15 years since you quit.  Yearly screening should stop if you develop a health problem that would prevent you from having lung cancer treatment.  Breast Cancer  Practice breast self-awareness.  This means understanding how your breasts normally appear and feel.  It also means doing regular breast self-exams. Let your health care provider know about any changes, no matter how small.  If you are in your 20s or 30s, you should have a clinical breast exam (CBE) by a health care provider every 1-3 years as part of a regular health exam.  If you are 29 or older, have a CBE every year. Also consider having a breast X-ray (mammogram) every year.  If you  have a family history of breast cancer, talk to your health care provider about genetic screening.  If you are at high risk for breast cancer, talk to your health care provider about having an MRI and a mammogram every year.  Breast cancer gene (BRCA) assessment is recommended for women who have family members with BRCA-related cancers. BRCA-related cancers include: ? Breast. ? Ovarian. ? Tubal. ? Peritoneal cancers.  Results of the assessment will determine the need for genetic counseling and BRCA1 and BRCA2 testing.  Cervical Cancer Your health care provider may recommend that you be screened regularly for cancer of the pelvic organs (ovaries, uterus, and vagina). This screening involves a pelvic examination, including checking for microscopic changes to the surface of your cervix (Pap test). You may be encouraged to have this screening done every 3 years, beginning at age 40.  For women ages 44-65, health care providers may recommend pelvic exams and Pap testing every 3 years, or they may recommend the Pap and pelvic exam, combined with testing for human papilloma virus (HPV), every 5 years. Some types of HPV increase your risk of cervical cancer. Testing for HPV may also be done on women of any age with unclear Pap test results.  Other health care providers may not recommend any screening for nonpregnant women who are considered low risk for pelvic cancer and who do not have symptoms. Ask your health care provider if a screening pelvic exam is right for you.  If you have had past treatment for cervical cancer or a condition that could lead to cancer, you need Pap tests and screening for cancer for at least 20 years after your treatment. If Pap tests have been discontinued, your risk factors (such as having a new sexual partner) need to be reassessed to determine if screening should resume. Some women have medical problems that increase the chance of getting cervical cancer. In these cases,  your health care provider may recommend more frequent screening and Pap tests.  Colorectal Cancer  This type of cancer can be detected and often prevented.  Routine colorectal cancer screening usually begins at 77 years of age and continues through 77 years of age.  Your health care provider may recommend screening at an earlier age if you have risk factors for colon cancer.  Your health care provider may also recommend using home test kits to check for hidden blood in the stool.  A small camera at the end of a tube can be used to examine your colon directly (sigmoidoscopy or colonoscopy). This is done to check for the earliest forms of colorectal cancer.  Routine screening usually begins at age 96.  Direct examination of the colon should be repeated every 5-10 years through 77 years of age. However, you may need to be screened more often if early forms of precancerous polyps or small growths are found.  Skin Cancer  Check your skin from head to toe regularly.  Tell your health care provider about any  new moles or changes in moles, especially if there is a change in a mole's shape or color.  Also tell your health care provider if you have a mole that is larger than the size of a pencil eraser.  Always use sunscreen. Apply sunscreen liberally and repeatedly throughout the day.  Protect yourself by wearing long sleeves, pants, a wide-brimmed hat, and sunglasses whenever you are outside.  Heart disease, diabetes, and high blood pressure  High blood pressure causes heart disease and increases the risk of stroke. High blood pressure is more likely to develop in: ? People who have blood pressure in the high end of the normal range (130-139/85-89 mm Hg). ? People who are overweight or obese. ? People who are African American.  If you are 58-1 years of age, have your blood pressure checked every 3-5 years. If you are 52 years of age or older, have your blood pressure checked every year.  You should have your blood pressure measured twice-once when you are at a hospital or clinic, and once when you are not at a hospital or clinic. Record the average of the two measurements. To check your blood pressure when you are not at a hospital or clinic, you can use: ? An automated blood pressure machine at a pharmacy. ? A home blood pressure monitor.  If you are between 24 years and 65 years old, ask your health care provider if you should take aspirin to prevent strokes.  Have regular diabetes screenings. This involves taking a blood sample to check your fasting blood sugar level. ? If you are at a normal weight and have a low risk for diabetes, have this test once every three years after 76 years of age. ? If you are overweight and have a high risk for diabetes, consider being tested at a younger age or more often. Preventing infection Hepatitis B  If you have a higher risk for hepatitis B, you should be screened for this virus. You are considered at high risk for hepatitis B if: ? You were born in a country where hepatitis B is common. Ask your health care provider which countries are considered high risk. ? Your parents were born in a high-risk country, and you have not been immunized against hepatitis B (hepatitis B vaccine). ? You have HIV or AIDS. ? You use needles to inject street drugs. ? You live with someone who has hepatitis B. ? You have had sex with someone who has hepatitis B. ? You get hemodialysis treatment. ? You take certain medicines for conditions, including cancer, organ transplantation, and autoimmune conditions.  Hepatitis C  Blood testing is recommended for: ? Everyone born from [birth year] through 1965. ? Anyone with known risk factors for hepatitis C.  Sexually transmitted infections (STIs)  You should be screened for sexually transmitted infections (STIs) including gonorrhea and chlamydia if: ? You are sexually active and are younger than 77 years of  age. ? You are older than 77 years of age and your health care provider tells you that you are at risk for this type of infection. ? Your sexual activity has changed since you were last screened and you are at an increased risk for chlamydia or gonorrhea. Ask your health care provider if you are at risk.  If you do not have HIV, but are at risk, it may be recommended that you take a prescription medicine daily to prevent HIV infection. This is called pre-exposure prophylaxis (PrEP). You are considered at  risk if: ? You are sexually active and do not regularly use condoms or know the HIV status of your partner(s). ? You take drugs by injection. ? You are sexually active with a partner who has HIV.  Talk with your health care provider about whether you are at high risk of being infected with HIV. If you choose to begin PrEP, you should first be tested for HIV. You should then be tested every 3 months for as long as you are taking PrEP. Pregnancy  If you are premenopausal and you may become pregnant, ask your health care provider about preconception counseling.  If you may become pregnant, take 400 to 800 micrograms (mcg) of folic acid every day.  If you want to prevent pregnancy, talk to your health care provider about birth control (contraception). Osteoporosis and menopause  Osteoporosis is a disease in which the bones lose minerals and strength with aging. This can result in serious bone fractures. Your risk for osteoporosis can be identified using a bone density scan.  If you are 6 years of age or older, or if you are at risk for osteoporosis and fractures, ask your health care provider if you should be screened.  Ask your health care provider whether you should take a calcium or vitamin D supplement to lower your risk for osteoporosis.  Menopause may have certain physical symptoms and risks.  Hormone replacement therapy may reduce some of these symptoms and risks. Talk to your health  care provider about whether hormone replacement therapy is right for you. Follow these instructions at home:  Schedule regular health, dental, and eye exams.  Stay current with your immunizations.  Do not use any tobacco products including cigarettes, chewing tobacco, or electronic cigarettes.  If you are pregnant, do not drink alcohol.  If you are breastfeeding, limit how much and how often you drink alcohol.  Limit alcohol intake to no more than 1 drink per day for nonpregnant women. One drink equals 12 ounces of beer, 5 ounces of wine, or 1 ounces of hard liquor.  Do not use street drugs.  Do not share needles.  Ask your health care provider for help if you need support or information about quitting drugs.  Tell your health care provider if you often feel depressed.  Tell your health care provider if you have ever been abused or do not feel safe at home. This information is not intended to replace advice given to you by your health care provider. Make sure you discuss any questions you have with your health care provider. Document Released: 12/01/2010 Document Revised: 10/24/2015 Document Reviewed: 02/19/2015 Elsevier Interactive Patient Education  [employer].

## 2017-09-12 NOTE — Progress Notes (Signed)
Subjective:    Patient ID: Laura Barnes, female    DOB: 30-Jul-1940, 77 y.o.   MRN: [id number]  HPI She is here for a physical exam.   She has no energy and has soreness from her fibromyalgia.  She does not do much during the day.   Between the pain and having no energy she could not exercise.  Periods of SOB.  She has seen pulmonary and cardiology.  She uses albuterol but it does not work.  She gets SOB every day.  Getting in and out of the chair is difficult - she gets SOB.  She gets shortness of breath after exertion.  She is very sedentary and does not think she can exercise due to her fibromyalgia.   She is a chronic cough that is unchanged.  She denies any wheezing.  She has no specific concerns.  Medications and allergies reviewed with patient and updated if appropriate.  Patient Active Problem List   Diagnosis Date Noted  . Dizziness 08/11/2017  . Sacroiliac pain 06/17/2017  . Greater trochanteric bursitis of right hip 05/27/2017  . Vertigo 04/29/2017  . Dysphagia 10/28/2016  . Lump of skin 05/11/2016  . 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 Chronic meniscal tear of knee 03/05/2016  . Nausea 03/04/2016  . Lightheadedness 12/18/2015  . Osteopenia 12/03/2015  . Thyroid nodule 11/16/2015  . Bilateral leg edema 11/05/2015  . Anterior neck pain 11/05/2015  . Hormone replacement therapy (HRT) 11/05/2015  . Subacromial bursitis 09/16/2015  . Umbilical pain 86/57/8469  . 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 Trochanteric bursitis of both hips 03/12/2015  . Subacromial impingement of left shoulder 03/12/2015  . Breast implant deflation 06/05/2014  . Hyperlipidemia 05/01/2014  . Chronic pain syndrome 05/01/2014  . Abnormal breast finding 05/01/2014  . Lumbar radiculopathy 03/27/2014  . Left shoulder pain 11/07/2013  . Degeneration of intervertebral disc of cervical region 10/10/2013  . Fibromyalgia 09/06/2013  . Bilateral shoulder  pain 09/06/2013  . Low back pain 08/12/2013  . Chronic cough 04/12/2013  . Sinusitis, chronic 04/12/2013  . Hypothyroidism 04/07/2012  . Chronic venous insufficiency 04/02/2010  . Enthesopathy of hip region 07/19/2007  . Osteoporosis 07/19/2007  . Anxiety state 01/23/2007  . Depression 01/23/2007  . Migraine 01/23/2007  . NINAR (noninfectious nonallergic rhinitis) 01/23/2007  . Gastroesophageal reflux disease 01/23/2007  . IBS 01/23/2007    Current Outpatient Medications on File Prior to Visit  Medication Sig Dispense Refill  . 5-Hydroxytryptophan (5-HTP PO) Take 1 capsule by mouth 2 (two) times daily.    Marland Kitchen acetaminophen (TYLENOL) 500 MG tablet Take 1,000 mg by mouth every 6 (six) hours as needed for mild pain, moderate pain, fever or headache.    . ALPRAZolam (XANAX) 0.5 MG tablet Take 1 tablet (0.5 mg total) by mouth 3 (three) times daily as needed. for anxiety 90 tablet 0  . Amino Acids (AMINO ACID PO) Take 1 capsule by mouth 2 (two) times daily.    . Ascorbic Acid (VITAMIN C) 1000 MG tablet Take 1,000 mg by mouth 2 (two) times daily.    . B Complex-Biotin-FA (B COMPLETE) TABS Take 1 tablet by mouth daily. B Complete 100mg     . benzonatate (TESSALON) 200 MG capsule TAKE 1 CAPSULE(200 MG) BY MOUTH THREE TIMES DAILY AS NEEDED FOR COUGH 90 capsule 6  . buPROPion (WELLBUTRIN XL) 150 MG 24 hr tablet TAKE 1 TABLET(150 MG) BY MOUTH EVERY MORNING 30 tablet 0  . cetirizine (  ZYRTEC) 10 MG tablet TAKE 1 TABLET (10 MG TOTAL) BY MOUTH DAILY. 30 tablet 11  . Cholecalciferol (VITAMIN D3) 1000 units CAPS Take 1,000 Units by mouth daily.    . Coenzyme Q10 (CO Q-10) 200 MG CAPS Take 1 capsule by mouth 2 (two) times daily.    Marland Kitchen ECHINACEA PO Take 1 tablet by mouth 2 (two) times daily.    Marland Kitchen estradiol (ESTRACE) 1 MG tablet Take 1 tablet (1 mg total) by mouth daily. --- Office visit needed for further refills 90 tablet 0  . HYDROmorphone (DILAUDID) 4 MG tablet Take 1 tablet (4 mg total) every 4 (four)  hours as needed by mouth for severe pain. 30 tablet 0  . LECITHIN PO Take 1 tablet by mouth daily.    Marland Kitchen levothyroxine (SYNTHROID, LEVOTHROID) 75 MCG tablet TAKE 1 TABLET BY MOUTH EVERY DAY IN THE MORNING 90 tablet 0  . meclizine (ANTIVERT) 25 MG tablet Take 1 tablet (25 mg total) by mouth 3 (three) times daily as needed for dizziness. 30 tablet 5  . Multiple Vitamin (MULTIVITAMIN WITH MINERALS) TABS tablet Take 1 tablet by mouth daily.    . Omega-3 Fatty Acids (SALMON OIL-1000 PO) Take 1,000 mg by mouth daily.    . ondansetron (ZOFRAN) 4 MG tablet Take 1 tablet (4 mg total) by mouth every 8 (eight) hours as needed for nausea or vomiting. 30 tablet 1  . ondansetron (ZOFRAN-ODT) 8 MG disintegrating tablet Take 1 tablet (8 mg total) by mouth every 8 (eight) hours as needed for nausea or vomiting. 30 tablet 2  . ranitidine (ZANTAC) 150 MG tablet Take 150 mg by mouth daily.    Marland Kitchen triamterene-hydrochlorothiazide (MAXZIDE-25) 37.5-25 MG tablet TAKE 1 TABLET BY MOUTH EVERY DAY 90 tablet 3  . TURMERIC PO Take 1 tablet by mouth 2 (two) times daily.     . vitamin E 400 UNIT capsule Take 400 Units by mouth daily.     No current facility-administered medications on file prior to visit.     Past Medical History:  Diagnosis Date  . ALLERGIC RHINITIS   . Allergy   . Anemia   . ANXIETY   . Arthritis    hands  . Bronchitis   . Chronic fatigue fibromyalgia syndrome   . Complication of anesthesia    says one time waking up she couldn't breathe, felt like her throat closing up  . DEPRESSION   . Enthesopathy of hip region   . Family history of anesthesia complication    sister with n/v  . FOOT PAIN, BILATERAL   . GERD   . Hiatal hernia   . HYPERLIPIDEMIA   . Hypothyroidism   . IBS   . MIGRAINE HEADACHE   . MIGRAINE, COMMON   . NECK MASS   . OSTEOPENIA   . OSTEOPOROSIS   . PLANTAR FASCIITIS   . RASH-NONVESICULAR   . SINUSITIS- ACUTE-NOS   . SKIN LESION   . VAGINITIS   . VENOUS INSUFFICIENCY,  CHRONIC     Past Surgical History:  Procedure Laterality Date  . APPENDECTOMY    . BREAST ENHANCEMENT SURGERY    . BREAST IMPLANT REMOVAL    . CHOLECYSTECTOMY    . COLONOSCOPY    . FOOT SURGERY  11/06/2016  . MASTECTOMY     bilateral for severe bilateral fibrocystic disease  . OOPHORECTOMY    . s/p neck lump removal    . SHOULDER SURGERY    . SINUS ENDO W/FUSION Right 06/30/2013  Procedure: RIGHT ENDOSCOPIC SPHENOIDECTOMY WITH FUSION SCAN;  Surgeon: [name], MD;  Location: [facility];  Service: ENT;  Laterality: Right;  . TONSILLECTOMY    . VAGINAL HYSTERECTOMY      Social History   Socioeconomic History  . Marital status: Married    Spouse name: Not on file  . Number of children: 2  . Years of education: Not on file  . Highest education level: Not on file  Occupational History  . Occupation: retired Water quality scientist  . Financial resource strain: Not on file  . Food insecurity:    Worry: Not on file    Inability: Not on file  . Transportation needs:    Medical: Not on file    Non-medical: Not on file  Tobacco Use  . Smoking status: Never Smoker  . Smokeless tobacco: Never Used  Substance and Sexual Activity  . Alcohol use: No  . Drug use: No  . Sexual activity: Not on file  Lifestyle  . Physical activity:    Days per week: Not on file    Minutes per session: Not on file  . Stress: Not on file  Relationships  . Social connections:    Talks on phone: Not on file    Gets together: Not on file    Attends religious service: Not on file    Active member of club or organization: Not on file    Attends meetings of clubs or organizations: Not on file    Relationship status: Not on file  Other Topics Concern  . Not on file  Social History Narrative   Has 2 biological children and 1 step child    Family History  Problem Relation Age of Onset  . Diabetes Mother   . Heart disease Father   . Diabetes Sister   . Breast cancer Maternal Grandmother     . Heart disease Maternal Uncle        x 2  . Diabetes Other        multi relatives on father side  . Heart disease Maternal Aunt   . Kidney disease Paternal Uncle        questionable  . Colon cancer Neg Hx   . Colon polyps Neg Hx   . Rectal cancer Neg Hx   . Stomach cancer Neg Hx     Review of Systems  Constitutional: Negative for chills and fever.       Temperature varies  Eyes: Negative for visual disturbance.  Respiratory: Positive for cough (chronic, no change) and shortness of breath (chronic). Negative for wheezing.   Cardiovascular: Positive for leg swelling. Negative for chest pain and palpitations.  Gastrointestinal: Positive for anal bleeding, constipation, diarrhea (chronic IBS) and nausea (intermittent). Negative for abdominal pain and blood in stool.  Endocrine: Positive for cold intolerance.  Genitourinary: Positive for difficulty urinating (intermittent). Negative for dysuria and hematuria.  Musculoskeletal: Positive for arthralgias and neck pain.  Skin: Negative for color change and rash.  Neurological: Positive for headaches (migraines/headaches).  Psychiatric/Behavioral: Positive for dysphoric mood. The patient is nervous/anxious.        Objective:   Vitals:   09/13/17 1415  BP: 134/84  Pulse: 93  Resp: 16  Temp: 98 F (36.7 C)  SpO2: 93%   Filed Weights   09/13/17 1415  Weight: 141 lb (64 kg)   Body mass index is 27.54 kg/m.  Wt Readings from Last 3 Encounters:  09/13/17 141 lb (64 kg)  08/19/17 140  lb (63.5 kg)  08/18/17 140 lb 6.4 oz (63.7 kg)     Physical Exam Constitutional: She appears well-developed and well-nourished. No distress.  HENT:  Head: Normocephalic and atraumatic.  Right Ear: External ear normal. Normal ear canal and TM Left Ear: External ear normal.  Normal ear canal and TM Mouth/Throat: Oropharynx is clear and moist.  Eyes: Conjunctivae and EOM are normal.  Neck: Neck supple. No tracheal deviation present. No  thyromegaly present.  No carotid bruit  Cardiovascular: Normal rate, regular rhythm and normal heart sounds.   No murmur heard.  Trace bilateral lower extremity edema. Pulmonary/Chest: Effort normal and breath sounds normal. No respiratory distress. She has no wheezes. She has no rales.  Breast: deferred Abdominal: Soft. She exhibits no distension. There is no tenderness. Musculoskeletal: Generalized pain with palpation of muscles Lymphadenopathy: She has mild cervical adenopathy.  Skin: Skin is warm and dry. She is not diaphoretic.  Psychiatric: She has a normal mood and affect. Her behavior is normal.        Assessment & Plan:   Physical exam: Screening blood work ordered Immunizations  Had first shingrix, other up to date Colonoscopy  - no longer needed due to age 38  No longer needed due to age Dexa   Up to date  - due this June Eye exams   Up to date  - to have cataract surgery may 9th EKG   Done 04/24/17 Exercise none, encouraged regular exercise-try yoga or a very gentle exercise Weight mildly overweight, but overall good for age Skin some changes in skin-likely age-related.  Advised her to monitor.  She has seen dermatology Substance abuse   none  See Problem List for Assessment and Plan of chronic medical problems.   Follow-up in 6 months

## 2017-09-13 ENCOUNTER — Encounter: Payer: Self-pay | Admitting: Internal Medicine

## 2017-09-13 ENCOUNTER — Other Ambulatory Visit (INDEPENDENT_AMBULATORY_CARE_PROVIDER_SITE_OTHER): Payer: Medicare Other

## 2017-09-13 ENCOUNTER — Ambulatory Visit (INDEPENDENT_AMBULATORY_CARE_PROVIDER_SITE_OTHER): Payer: Medicare Other | Admitting: Internal Medicine

## 2017-09-13 ENCOUNTER — Ambulatory Visit: Payer: Medicare Other

## 2017-09-13 VITALS — BP 134/84 | HR 93 | Temp 98.0°F | Resp 16 | Ht 60.0 in | Wt 141.0 lb

## 2017-09-13 DIAGNOSIS — K219 Gastro-esophageal reflux disease without esophagitis: Secondary | ICD-10-CM | POA: Diagnosis not present

## 2017-09-13 DIAGNOSIS — Z79890 Hormone replacement therapy (postmenopausal): Secondary | ICD-10-CM | POA: Diagnosis not present

## 2017-09-13 DIAGNOSIS — F411 Generalized anxiety disorder: Secondary | ICD-10-CM

## 2017-09-13 DIAGNOSIS — Z0000 Encounter for general adult medical examination without abnormal findings: Secondary | ICD-10-CM | POA: Diagnosis not present

## 2017-09-13 DIAGNOSIS — R110 Nausea: Secondary | ICD-10-CM | POA: Diagnosis not present

## 2017-09-13 DIAGNOSIS — F419 Anxiety disorder, unspecified: Secondary | ICD-10-CM

## 2017-09-13 DIAGNOSIS — G43809 Other migraine, not intractable, without status migrainosus: Secondary | ICD-10-CM | POA: Diagnosis not present

## 2017-09-13 DIAGNOSIS — Z0001 Encounter for general adult medical examination with abnormal findings: Secondary | ICD-10-CM

## 2017-09-13 DIAGNOSIS — R600 Localized edema: Secondary | ICD-10-CM

## 2017-09-13 DIAGNOSIS — E039 Hypothyroidism, unspecified: Secondary | ICD-10-CM

## 2017-09-13 DIAGNOSIS — M797 Fibromyalgia: Secondary | ICD-10-CM | POA: Diagnosis not present

## 2017-09-13 DIAGNOSIS — F329 Major depressive disorder, single episode, unspecified: Secondary | ICD-10-CM | POA: Diagnosis not present

## 2017-09-13 DIAGNOSIS — F32A Depression, unspecified: Secondary | ICD-10-CM

## 2017-09-13 LAB — COMPREHENSIVE METABOLIC PANEL
ALT: 15 U/L (ref 0–35)
AST: 20 U/L (ref 0–37)
Albumin: 4 g/dL (ref 3.5–5.2)
Alkaline Phosphatase: 62 U/L (ref 39–117)
BUN: 11 mg/dL (ref 6–23)
CO2: 29 mEq/L (ref 19–32)
Calcium: 10 mg/dL (ref 8.4–10.5)
Chloride: 95 mEq/L — ABNORMAL LOW (ref 96–112)
Creatinine, Ser: 0.71 mg/dL (ref 0.40–1.20)
GFR: 84.91 mL/min (ref >60.00)
Glucose, Bld: 98 mg/dL (ref 70–99)
Potassium: 4.6 mEq/L (ref 3.5–5.1)
Sodium: 131 mEq/L — ABNORMAL LOW (ref 135–145)
Total Bilirubin: 0.4 mg/dL (ref 0.2–1.2)
Total Protein: 7.7 g/dL (ref 6.0–8.3)

## 2017-09-13 LAB — CBC WITH DIFFERENTIAL/PLATELET
Basophils Absolute: 0 10*3/uL (ref 0.0–0.1)
Basophils Relative: 0.3 % (ref 0.0–3.0)
Eosinophils Absolute: 0.1 10*3/uL (ref 0.0–0.7)
Eosinophils Relative: 1.1 % (ref 0.0–5.0)
HCT: 39.4 % (ref 36.0–46.0)
Hemoglobin: 13.4 g/dL (ref 12.0–15.0)
Lymphocytes Relative: 20.2 % (ref 12.0–46.0)
Lymphs Abs: 2.2 10*3/uL (ref 0.7–4.0)
MCHC: 34.1 g/dL (ref 30.0–36.0)
MCV: 94 fl (ref 78.0–100.0)
Monocytes Absolute: 0.9 10*3/uL (ref 0.1–1.0)
Monocytes Relative: 8.8 % (ref 3.0–12.0)
Neutro Abs: 7.5 10*3/uL (ref 1.4–7.7)
Neutrophils Relative %: 69.6 % (ref 43.0–77.0)
Platelets: 312 10*3/uL (ref 150.0–400.0)
RBC: 4.19 Mil/uL (ref 3.87–5.11)
RDW: 13.9 % (ref 11.5–15.5)
WBC: 10.7 10*3/uL — ABNORMAL HIGH (ref 4.0–10.5)

## 2017-09-13 LAB — TSH: TSH: 0.75 u[IU]/mL (ref 0.35–4.50)

## 2017-09-13 LAB — LIPID PANEL
Cholesterol: 178 mg/dL (ref 0–200)
HDL: 72 mg/dL (ref >39.00)
LDL Cholesterol: 93 mg/dL (ref 0–99)
NonHDL: 105.81
Total CHOL/HDL Ratio: 2
Triglycerides: 63 mg/dL (ref 0.0–149.0)
VLDL: 12.6 mg/dL (ref 0.0–40.0)

## 2017-09-13 LAB — HEMOGLOBIN A1C: Hgb A1c MFr Bld: 5.5 % (ref 4.6–6.5)

## 2017-09-13 MED ORDER — TRIAMTERENE-HCTZ 37.5-25 MG PO TABS: 1.0000 | ORAL_TABLET | Freq: Every day | ORAL | 3 refills | Status: DC

## 2017-09-13 MED ORDER — ALPRAZOLAM 0.5 MG PO TABS: 0.5000 mg | ORAL_TABLET | Freq: Three times a day (TID) | ORAL | 0 refills | Status: DC | PRN

## 2017-09-13 MED ORDER — ESTRADIOL 1 MG PO TABS: 0.5000 mg | ORAL_TABLET | Freq: Every day | ORAL | 1 refills | Status: DC

## 2017-09-13 MED ORDER — LEVOTHYROXINE SODIUM 75 MCG PO TABS: ORAL_TABLET | ORAL | 1 refills | Status: DC

## 2017-09-13 MED ORDER — HYDROMORPHONE HCL 4 MG PO TABS: 4.0000 mg | ORAL_TABLET | ORAL | 0 refills | Status: DC | PRN

## 2017-09-13 MED ORDER — BUPROPION HCL ER (XL) 150 MG PO TB24
ORAL_TABLET | ORAL | 1 refills | Status: DC
Start: 2017-09-13 — End: 2018-03-19

## 2017-09-13 NOTE — Assessment & Plan Note (Addendum)
Chronic pain Not controlled Taking CBD oil Stressed the importance of regular exercise, which she is not able to do secondary to the pain and fatigue Following with Dr. Tamala Julian

## 2017-09-13 NOTE — Assessment & Plan Note (Signed)
States depression overall is controlled She is doing well since her husband's death Continue Wellbutrin 150 mg daily

## 2017-09-13 NOTE — Assessment & Plan Note (Signed)
Controlled, stable Continue Xanax at current dose

## 2017-09-13 NOTE — Assessment & Plan Note (Signed)
GERD controlled Continue daily medication  

## 2017-09-13 NOTE — Progress Notes (Deleted)
Subjective:   Laura Barnes is a 77 y.o. female who presents for an Initial Medicare Annual Wellness Visit.  Review of Systems    No ROS.  Medicare Wellness Visit. Additional risk factors are reflected in the social history.     Sleep patterns: {SX; SLEEP PATTERNS:18802::"feels rested on waking","does not get up to void","gets up *** times nightly to void","sleeps *** hours nightly"}.    Home Safety/Smoke Alarms: Feels safe in home. Smoke alarms in place.  Living environment; residence and Firearm Safety: {Rehab home environment / accessibility:30080::"no firearms","firearms stored safely"}. Seat Belt Safety/Bike Helmet: Wears seat belt.     Objective:    There were no vitals filed for this visit. There is no height or weight on file to calculate BMI.  Advanced Directives 04/23/2017 08/07/2016 09/16/2015 06/18/2015 05/14/2015 04/10/2015 03/12/2015  Does Patient Have a Medical Advance Directive? Yes No Yes Yes Yes Yes Yes  Type of Paramedic of [place];Living will - [place];Living will - Living will - [place];Living will  Does patient want to make changes to medical advance directive? - - - - - - -  Copy of [place] in Chart? - - - - - - No - copy requested  Would patient like information on creating a medical advance directive? - No - Patient declined - - - - -    Current Medications (verified) Outpatient Encounter Medications as of 09/13/2017  Medication Sig  . 5-Hydroxytryptophan (5-HTP PO) Take 1 capsule by mouth 2 (two) times daily.  Marland Kitchen acetaminophen (TYLENOL) 500 MG tablet Take 1,000 mg by mouth every 6 (six) hours as needed for mild pain, moderate pain, fever or headache.  . ALPRAZolam (XANAX) 0.5 MG tablet Take 1 tablet (0.5 mg total) by mouth 3 (three) times daily as needed. for anxiety  . Amino Acids (AMINO ACID PO) Take 1 capsule by mouth 2 (two) times daily.  . Ascorbic Acid  (VITAMIN C) 1000 MG tablet Take 1,000 mg by mouth 2 (two) times daily.  . B Complex-Biotin-FA (B COMPLETE) TABS Take 1 tablet by mouth daily. B Complete 100mg   . benzonatate (TESSALON) 200 MG capsule TAKE 1 CAPSULE(200 MG) BY MOUTH THREE TIMES DAILY AS NEEDED FOR COUGH  . buPROPion (WELLBUTRIN XL) 150 MG 24 hr tablet TAKE 1 TABLET(150 MG) BY MOUTH EVERY MORNING  . cetirizine (ZYRTEC) 10 MG tablet TAKE 1 TABLET (10 MG TOTAL) BY MOUTH DAILY.  Marland Kitchen Cholecalciferol (VITAMIN D3) 1000 units CAPS Take 1,000 Units by mouth daily.  . Coenzyme Q10 (CO Q-10) 200 MG CAPS Take 1 capsule by mouth 2 (two) times daily.  Marland Kitchen ECHINACEA PO Take 1 tablet by mouth 2 (two) times daily.  Marland Kitchen estradiol (ESTRACE) 1 MG tablet Take 1 tablet (1 mg total) by mouth daily. --- Office visit needed for further refills  . HYDROmorphone (DILAUDID) 4 MG tablet Take 1 tablet (4 mg total) every 4 (four) hours as needed by mouth for severe pain.  Marland Kitchen LECITHIN PO Take 1 tablet by mouth daily.  Marland Kitchen levothyroxine (SYNTHROID, LEVOTHROID) 75 MCG tablet TAKE 1 TABLET BY MOUTH EVERY DAY IN THE MORNING  . meclizine (ANTIVERT) 25 MG tablet Take 1 tablet (25 mg total) by mouth 3 (three) times daily as needed for dizziness.  . Multiple Vitamin (MULTIVITAMIN WITH MINERALS) TABS tablet Take 1 tablet by mouth daily.  . Omega-3 Fatty Acids (SALMON OIL-1000 PO) Take 1,000 mg by mouth daily.  . ondansetron (ZOFRAN) 4  MG tablet Take 1 tablet (4 mg total) by mouth every 8 (eight) hours as needed for nausea or vomiting.  . ondansetron (ZOFRAN-ODT) 8 MG disintegrating tablet Take 1 tablet (8 mg total) by mouth every 8 (eight) hours as needed for nausea or vomiting.  . ranitidine (ZANTAC) 150 MG tablet Take 150 mg by mouth daily.  Marland Kitchen triamterene-hydrochlorothiazide (MAXZIDE-25) 37.5-25 MG tablet TAKE 1 TABLET BY MOUTH EVERY DAY  . TURMERIC PO Take 1 tablet by mouth 2 (two) times daily.   . vitamin E 400 UNIT capsule Take 400 Units by mouth daily.   No  facility-administered encounter medications on file as of 09/13/2017.     Allergies (verified) Prednisone; Amoxicillin-pot clavulanate; Aspirin; Erythromycin; Latex; Levofloxacin; Nsaids; Statins; Sumatriptan; Adhesive [tape]; Ivp dye [iodinated diagnostic agents]; Methylphenidate hcl; Mirtazapine; Shellfish allergy; Soy allergy; Doxycycline; Hydrocodone; Hydrocodone-acetaminophen; Rofecoxib; Sertraline hcl; and Sulfonamide derivatives   History: Past Medical History:  Diagnosis Date  . ALLERGIC RHINITIS   . Allergy   . Anemia   . ANXIETY   . Arthritis    hands  . Bronchitis   . Chronic fatigue fibromyalgia syndrome   . Complication of anesthesia    says one time waking up she couldn't breathe, felt like her throat closing up  . DEPRESSION   . Enthesopathy of hip region   . Family history of anesthesia complication    sister with n/v  . FOOT PAIN, BILATERAL   . GERD   . Hiatal hernia   . HYPERLIPIDEMIA   . Hypothyroidism   . IBS   . MIGRAINE HEADACHE   . MIGRAINE, COMMON   . NECK MASS   . OSTEOPENIA   . OSTEOPOROSIS   . PLANTAR FASCIITIS   . RASH-NONVESICULAR   . SINUSITIS- ACUTE-NOS   . SKIN LESION   . VAGINITIS   . VENOUS INSUFFICIENCY, CHRONIC    Past Surgical History:  Procedure Laterality Date  . APPENDECTOMY    . BREAST ENHANCEMENT SURGERY    . BREAST IMPLANT REMOVAL    . CHOLECYSTECTOMY    . COLONOSCOPY    . FOOT SURGERY  11/06/2016  . MASTECTOMY     bilateral for severe bilateral fibrocystic disease  . OOPHORECTOMY    . s/p neck lump removal    . SHOULDER SURGERY    . SINUS ENDO W/FUSION Right 06/30/2013   Procedure: RIGHT ENDOSCOPIC SPHENOIDECTOMY WITH FUSION SCAN;  Surgeon: [name], MD;  Location: [facility];  Service: ENT;  Laterality: Right;  . TONSILLECTOMY    . VAGINAL HYSTERECTOMY     Family History  Problem Relation Age of Onset  . Diabetes Mother   . Heart disease Father   . Diabetes Sister   . Breast cancer Maternal Grandmother   .  Heart disease Maternal Uncle        x 2  . Diabetes Other        multi relatives on father side  . Heart disease Maternal Aunt   . Kidney disease Paternal Uncle        questionable  . Colon cancer Neg Hx   . Colon polyps Neg Hx   . Rectal cancer Neg Hx   . Stomach cancer Neg Hx    Social History   Socioeconomic History  . Marital status: Married    Spouse name: Not on file  . Number of children: 2  . Years of education: Not on file  . Highest education level: Not on file  Occupational History  .  Occupation: retired Water quality scientist  . Financial resource strain: Not on file  . Food insecurity:    Worry: Not on file    Inability: Not on file  . Transportation needs:    Medical: Not on file    Non-medical: Not on file  Tobacco Use  . Smoking status: Never Smoker  . Smokeless tobacco: Never Used  Substance and Sexual Activity  . Alcohol use: No  . Drug use: No  . Sexual activity: Not on file  Lifestyle  . Physical activity:    Days per week: Not on file    Minutes per session: Not on file  . Stress: Not on file  Relationships  . Social connections:    Talks on phone: Not on file    Gets together: Not on file    Attends religious service: Not on file    Active member of club or organization: Not on file    Attends meetings of clubs or organizations: Not on file    Relationship status: Not on file  Other Topics Concern  . Not on file  Social History Narrative   Has 2 biological children and 1 step child    Tobacco Counseling Counseling given: Not Answered  Activities of Daily Living No flowsheet data found.   Immunizations and Health Maintenance Immunization History  Administered Date(s) Administered  . Influenza Split 04/07/2011, 03/08/2012  . Influenza Whole 03/20/2008, 04/01/2009, 04/02/2010  . Influenza, High Dose Seasonal PF 04/12/2013, 03/26/2015, 03/04/2016, 04/01/2017  . Influenza,inj,Quad PF,6+ Mos 05/01/2014  . Pneumococcal  Conjugate-13 05/02/2013  . Pneumococcal Polysaccharide-23 01/25/2006  . Td 09/17/2008  . Zoster 01/25/2006  . Zoster Recombinat (Shingrix) 08/18/2017   There are no preventive care reminders to display for this patient.  Patient Care Team: Binnie Rail, MD as PCP - General (Internal Medicine)  Indicate any recent Medical Services you may have received from other than [facility] providers in the past year (date may be approximate).     Assessment:   This is a routine wellness examination for Laura Barnes. Physical assessment deferred to PCP.   Hearing/Vision screen No exam data present  Dietary issues and exercise activities discussed:   Diet (meal preparation, eat out, water intake, caffeinated beverages, dairy products, fruits and vegetables): {Desc; diets:16563}  Goals    None     Depression Screen PHQ 2/9 Scores 04/30/2017 02/28/2016 01/29/2015 12/17/2014 05/01/2014  PHQ - 2 Score 0 0 0 0 0  PHQ- 9 Score - - - 0 -    Fall Risk Fall Risk  04/30/2017 02/28/2016 09/16/2015 06/18/2015 05/14/2015  Falls in the past year? Yes No No No No  Risk for fall due to : (No Data) - - - -  Risk for fall due to: Comment VErtigo - - - -   Cognitive Function:        Screening Tests Health Maintenance  Topic Date Due  . DEXA SCAN  11/26/2017  . INFLUENZA VACCINE  12/30/2017  . TETANUS/TDAP  09/18/2018  . PNA vac Low Risk Adult  Completed     Plan:     I have personally reviewed and noted the following in the patient's chart:   . Medical and social history . Use of alcohol, tobacco or illicit drugs  . Current medications and supplements . Functional ability and status . Nutritional status . Physical activity . Advanced directives . List of other physicians . Vitals . Screenings to include cognitive, depression, and falls . Referrals and appointments  In addition, I have reviewed and discussed with patient certain preventive protocols, quality metrics, and best practice  recommendations. A written personalized care plan for preventive services as well as general preventive health recommendations were provided to patient.     Michiel Cowboy, RN   09/13/2017

## 2017-09-13 NOTE — Assessment & Plan Note (Signed)
Controlled with water pill We will continue CMP

## 2017-09-13 NOTE — Assessment & Plan Note (Signed)
Chronic, intermittent Takes antinausea medication as needed Tends to come with migraines/headaches and chronic pain Continue medication as needed

## 2017-09-13 NOTE — Assessment & Plan Note (Signed)
Taking Estrace 1 mg daily Reviewed risks of taking medication and encouraged her to try decreasing the dose and she agreed Decrease to 0.5 mg daily

## 2017-09-13 NOTE — Assessment & Plan Note (Signed)
She has occasional migraines Was on Topamax at one point-no longer taking Uses CBD oil Has taken Demerol in the past-I advised her after today we will not prescribe her any further narcotic pain medication

## 2017-09-13 NOTE — Assessment & Plan Note (Signed)
Check tsh  Titrate med dose if needed  

## 2017-09-20 NOTE — Progress Notes (Signed)
Subjective:   Laura Barnes is a 77 y.o. female who presents for an Initial Medicare Annual Wellness Visit.  Review of Systems    No ROS.  Medicare Wellness Visit. Additional risk factors are reflected in the social history.   Cardiac Risk Factors include: advanced age (>79men, >26 women);dyslipidemia;sedentary lifestyle Sleep patterns: has frequent nighttime awakenings, gets up 2 times nightly to void and sleeps 5-6 hours nightly. Patient reports insomnia issues, discussed recommended sleep tips and stress reduction tips.   Home Safety/Smoke Alarms: Feels safe in home. Smoke alarms in place.  Living environment; residence and Firearm Safety: 1-story house/ trailer, no firearms Lives alone, no needs for DME, good support system. Seat Belt Safety/Bike Helmet: Wears seat belt.      Objective:    Today's Vitals   09/21/17 1519 09/21/17 1524  BP: (!) 126/58   Pulse: 78   Resp: 18   SpO2: 98%   Weight: 145 lb (65.8 kg)   Height: 5' (1.524 m)   PainSc:  3    Body mass index is 28.32 kg/m.  Advanced Directives 09/21/2017 04/23/2017 08/07/2016 09/16/2015 06/18/2015 05/14/2015 04/10/2015  Does Patient Have a Medical Advance Directive? Yes Yes No Yes Yes Yes Yes  Type of Paramedic of [place];Living will [place];Living will - [place];Living will - Living will -  Does patient want to make changes to medical advance directive? - - - - - - -  Copy of [place] in Chart? No - copy requested - - - - - -  Would patient like information on creating a medical advance directive? - - No - Patient declined - - - -    Current Medications (verified) Outpatient Encounter Medications as of 09/21/2017  Medication Sig  . 5-Hydroxytryptophan (5-HTP PO) Take 1 capsule by mouth 2 (two) times daily.  Marland Kitchen acetaminophen (TYLENOL) 500 MG tablet Take 1,000 mg by mouth every 6 (six) hours as needed for mild pain, moderate  pain, fever or headache.  . ALPRAZolam (XANAX) 0.5 MG tablet Take 1 tablet (0.5 mg total) by mouth 3 (three) times daily as needed. for anxiety  . Amino Acids (AMINO ACID PO) Take 1 capsule by mouth 2 (two) times daily.  . Ascorbic Acid (VITAMIN C) 1000 MG tablet Take 1,000 mg by mouth 2 (two) times daily.  . B Complex-Biotin-FA (B COMPLETE) TABS Take 1 tablet by mouth daily. B Complete 100mg   . benzonatate (TESSALON) 200 MG capsule TAKE 1 CAPSULE(200 MG) BY MOUTH THREE TIMES DAILY AS NEEDED FOR COUGH  . buPROPion (WELLBUTRIN XL) 150 MG 24 hr tablet TAKE 1 TABLET(150 MG) BY MOUTH EVERY MORNING  . cetirizine (ZYRTEC) 10 MG tablet TAKE 1 TABLET (10 MG TOTAL) BY MOUTH DAILY.  Marland Kitchen Cholecalciferol (VITAMIN D3) 1000 units CAPS Take 1,000 Units by mouth daily.  . Coenzyme Q10 (CO Q-10) 200 MG CAPS Take 1 capsule by mouth 2 (two) times daily.  Marland Kitchen ECHINACEA PO Take 1 tablet by mouth 2 (two) times daily.  Marland Kitchen estradiol (ESTRACE) 1 MG tablet Take 0.5 tablets (0.5 mg total) by mouth daily. --- Office visit needed for further refills  . LECITHIN PO Take 1 tablet by mouth daily.  Marland Kitchen levothyroxine (SYNTHROID, LEVOTHROID) 75 MCG tablet TAKE 1 TABLET BY MOUTH EVERY DAY IN THE MORNING  . MAGNESIUM ASPARTATE PO Take 1 tablet by mouth 2 (two) times daily.  . meclizine (ANTIVERT) 25 MG tablet Take 1 tablet (25 mg total)  by mouth 3 (three) times daily as needed for dizziness.  . Multiple Vitamin (MULTIVITAMIN WITH MINERALS) TABS tablet Take 1 tablet by mouth daily.  . Omega-3 Fatty Acids (SALMON OIL-1000 PO) Take 1,000 mg by mouth daily.  . ondansetron (ZOFRAN) 4 MG tablet Take 1 tablet (4 mg total) by mouth every 8 (eight) hours as needed for nausea or vomiting.  . ondansetron (ZOFRAN-ODT) 8 MG disintegrating tablet Take 1 tablet (8 mg total) by mouth every 8 (eight) hours as needed for nausea or vomiting.  Marland Kitchen POTASSIUM AMINOBENZOATE PO Take 1 tablet by mouth daily.  . ranitidine (ZANTAC) 150 MG tablet Take 150 mg by mouth  daily.  Marland Kitchen triamterene-hydrochlorothiazide (MAXZIDE-25) 37.5-25 MG tablet Take 1 tablet by mouth daily.  . TURMERIC PO Take 1 tablet by mouth 2 (two) times daily.   . vitamin E 400 UNIT capsule Take 400 Units by mouth daily.   No facility-administered encounter medications on file as of 09/21/2017.     Allergies (verified) Prednisone; Amoxicillin-pot clavulanate; Aspirin; Erythromycin; Latex; Levofloxacin; Nsaids; Statins; Sumatriptan; Adhesive [tape]; Ivp dye [iodinated diagnostic agents]; Methylphenidate hcl; Mirtazapine; Shellfish allergy; Soy allergy; Doxycycline; Hydrocodone; Hydrocodone-acetaminophen; Rofecoxib; Sertraline hcl; and Sulfonamide derivatives   History: Past Medical History:  Diagnosis Date  . ALLERGIC RHINITIS   . Allergy   . Anemia   . ANXIETY   . Arthritis    hands  . Bronchitis   . Chronic fatigue fibromyalgia syndrome   . Complication of anesthesia    says one time waking up she couldn't breathe, felt like her throat closing up  . DEPRESSION   . Enthesopathy of hip region   . Family history of anesthesia complication    sister with n/v  . FOOT PAIN, BILATERAL   . GERD   . Hiatal hernia   . HYPERLIPIDEMIA   . Hypothyroidism   . IBS   . MIGRAINE HEADACHE   . MIGRAINE, COMMON   . NECK MASS   . OSTEOPENIA   . OSTEOPOROSIS   . PLANTAR FASCIITIS   . RASH-NONVESICULAR   . SINUSITIS- ACUTE-NOS   . SKIN LESION   . VAGINITIS   . VENOUS INSUFFICIENCY, CHRONIC    Past Surgical History:  Procedure Laterality Date  . APPENDECTOMY    . BREAST ENHANCEMENT SURGERY    . BREAST IMPLANT REMOVAL    . CHOLECYSTECTOMY    . COLONOSCOPY    . FOOT SURGERY  11/06/2016  . MASTECTOMY     bilateral for severe bilateral fibrocystic disease  . OOPHORECTOMY    . s/p neck lump removal    . SHOULDER SURGERY    . SINUS ENDO W/FUSION Right 06/30/2013   Procedure: RIGHT ENDOSCOPIC SPHENOIDECTOMY WITH FUSION SCAN;  Surgeon: [name], MD;  Location: [facility];  Service:  ENT;  Laterality: Right;  . TONSILLECTOMY    . VAGINAL HYSTERECTOMY     Family History  Problem Relation Age of Onset  . Diabetes Mother   . Heart disease Father   . Diabetes Sister   . Breast cancer Maternal Grandmother   . Heart disease Maternal Uncle        x 2  . Diabetes Other        multi relatives on father side  . Heart disease Maternal Aunt   . Kidney disease Paternal Uncle        questionable  . Colon cancer Neg Hx   . Colon polyps Neg Hx   . Rectal cancer Neg Hx   .  Stomach cancer Neg Hx    Social History   Socioeconomic History  . Marital status: Widowed    Spouse name: Not on file  . Number of children: 2  . Years of education: Not on file  . Highest education level: Not on file  Occupational History  . Occupation: retired Water quality scientist  . Financial resource strain: Not hard at all  . Food insecurity:    Worry: Never true    Inability: Never true  . Transportation needs:    Medical: No    Non-medical: No  Tobacco Use  . Smoking status: Never Smoker  . Smokeless tobacco: Never Used  Substance and Sexual Activity  . Alcohol use: No  . Drug use: No  . Sexual activity: Not Currently  Lifestyle  . Physical activity:    Days per week: 0 days    Minutes per session: 0 min  . Stress: Not at all  Relationships  . Social connections:    Talks on phone: More than three times a week    Gets together: More than three times a week    Attends religious service: More than 4 times per year    Active member of club or organization: Not on file    Attends meetings of clubs or organizations: More than 4 times per year    Relationship status: Widowed  Other Topics Concern  . Not on file  Social History Narrative   Has 2 biological children and 1 step child    Tobacco Counseling Counseling given: Not Answered    Activities of Daily Living In your present state of health, do you have any difficulty performing the following activities: 09/21/2017   Hearing? N  Vision? N  Difficulty concentrating or making decisions? N  Walking or climbing stairs? N  Dressing or bathing? N  Doing errands, shopping? N  Preparing Food and eating ? N  Using the Toilet? N  In the past six months, have you accidently leaked urine? N  Do you have problems with loss of bowel control? N  Managing your Medications? N  Managing your Finances? N  Housekeeping or managing your Housekeeping? N  Some recent data might be hidden     Immunizations and Health Maintenance Immunization History  Administered Date(s) Administered  . Influenza Split 04/07/2011, 03/08/2012  . Influenza Whole 03/20/2008, 04/01/2009, 04/02/2010  . Influenza, High Dose Seasonal PF 04/12/2013, 03/26/2015, 03/04/2016, 04/01/2017  . Influenza,inj,Quad PF,6+ Mos 05/01/2014  . Pneumococcal Conjugate-13 05/02/2013  . Pneumococcal Polysaccharide-23 01/25/2006  . Td 09/17/2008  . Zoster 01/25/2006  . Zoster Recombinat (Shingrix) 08/18/2017   There are no preventive care reminders to display for this patient.  Patient Care Team: Binnie Rail, MD as PCP - General (Internal Medicine)  Indicate any recent Medical Services you may have received from other than [facility] providers in the past year (date may be approximate).     Assessment:   This is a routine wellness examination for Junette. Physical assessment deferred to PCP.   Hearing/Vision screen Hearing Screening Comments: Able to hear conversational tones w/o difficulty. No issues reported.  Passed whisper test Vision Screening Comments: appointment every 6 months Dr. Celene Squibb  Dietary issues and exercise activities discussed: Current Exercise Habits: The patient does not participate in regular exercise at present Diet (meal preparation, eat out, water intake, caffeinated beverages, dairy products, fruits and vegetables): in general, a "healthy" diet  , well balanced   Reviewed heart healthy diet, encouraged patient to  increase  daily water intake.    Goals    . Patient Stated     Continue to stay engaged socially, be active within my church. Enjoy working in my flower beds and remodel some rooms in my house. Love family and [place].      Depression Screen PHQ 2/9 Scores 09/21/2017 04/30/2017 02/28/2016 01/29/2015 12/17/2014 05/01/2014  PHQ - 2 Score 1 0 0 0 0 0  PHQ- 9 Score 5 - - - 0 -    Fall Risk Fall Risk  09/21/2017 04/30/2017 02/28/2016 09/16/2015 06/18/2015  Falls in the past year? No Yes No No No  Risk for fall due to : - (No Data) - - -  Risk for fall due to: Comment - VErtigo - - -   Cognitive Function: MMSE - Mini Mental State Exam 09/21/2017  Orientation to time 5  Orientation to Place 5  Registration 3  Attention/ Calculation 5  Recall 1  Language- name 2 objects 2  Language- repeat 1  Language- follow 3 step command 3  Language- read & follow direction 1  Write a sentence 1  Copy design 1  Total score 28        Screening Tests Health Maintenance  Topic Date Due  . DEXA SCAN  11/26/2017  . INFLUENZA VACCINE  12/30/2017  . TETANUS/TDAP  09/18/2018  . PNA vac Low Risk Adult  Completed     Plan:    Continue doing brain stimulating activities (puzzles, reading, adult coloring books, staying active) to keep memory sharp.   Continue to eat heart healthy diet (full of fruits, vegetables, whole grains, lean protein, water--limit salt, fat, and sugar intake) and increase physical activity as tolerated.  I have personally reviewed and noted the following in the patient's chart:   . Medical and social history . Use of alcohol, tobacco or illicit drugs  . Current medications and supplements . Functional ability and status . Nutritional status . Physical activity . Advanced directives . List of other physicians . Vitals . Screenings to include cognitive, depression, and falls . Referrals and appointments  In addition, I have reviewed and discussed with patient certain preventive  protocols, quality metrics, and best practice recommendations. A written personalized care plan for preventive services as well as general preventive health recommendations were provided to patient.     Michiel Cowboy, RN   09/21/2017

## 2017-09-21 ENCOUNTER — Ambulatory Visit (INDEPENDENT_AMBULATORY_CARE_PROVIDER_SITE_OTHER): Payer: Medicare Other | Admitting: *Deleted

## 2017-09-21 VITALS — BP 126/58 | HR 78 | Resp 18 | Ht 60.0 in | Wt 145.0 lb

## 2017-09-21 DIAGNOSIS — Z0000 Encounter for general adult medical examination without abnormal findings: Secondary | ICD-10-CM

## 2017-09-21 NOTE — Patient Instructions (Signed)
Continue doing brain stimulating activities (puzzles, reading, adult coloring books, staying active) to keep memory sharp.   Continue to eat heart healthy diet (full of fruits, vegetables, whole grains, lean protein, water--limit salt, fat, and sugar intake) and increase physical activity as tolerated.   Laura Barnes , Thank you for taking time to come for your Medicare Wellness Visit. I appreciate your ongoing commitment to your health goals. Please review the following plan we discussed and let me know if I can assist you in the future.   These are the goals we discussed: Goals    . Patient Stated     Continue to stay engaged socially, be active within my church. Enjoy working in my flower beds and remodel some rooms in my house. Love family and [place].       This is a list of the screening recommended for you and due dates:  Health Maintenance  Topic Date Due  . DEXA scan (bone density measurement)  11/26/2017  . Flu Shot  12/30/2017  . Tetanus Vaccine  09/18/2018  . Pneumonia vaccines  Completed    Health Maintenance for Postmenopausal Women Menopause is a normal process in which your reproductive ability comes to an end. This process happens gradually over a span of months to years, usually between the ages of 81 and 33. Menopause is complete when you have missed 12 consecutive menstrual periods. It is important to talk with your health care provider about some of the most common conditions that affect postmenopausal women, such as heart disease, cancer, and bone loss (osteoporosis). Adopting a healthy lifestyle and getting preventive care can help to promote your health and wellness. Those actions can also lower your chances of developing some of these common conditions. What should I know about menopause? During menopause, you may experience a number of symptoms, such as:  Moderate-to-severe hot flashes.  Night sweats.  Decrease in sex drive.  Mood  swings.  Headaches.  Tiredness.  Irritability.  Memory problems.  Insomnia.  Choosing to treat or not to treat menopausal changes is an individual decision that you make with your health care provider. What should I know about hormone replacement therapy and supplements? Hormone therapy products are effective for treating symptoms that are associated with menopause, such as hot flashes and night sweats. Hormone replacement carries certain risks, especially as you become older. If you are thinking about using estrogen or estrogen with progestin treatments, discuss the benefits and risks with your health care provider. What should I know about heart disease and stroke? Heart disease, heart attack, and stroke become more likely as you age. This may be due, in part, to the hormonal changes that your body experiences during menopause. These can affect how your body processes dietary fats, triglycerides, and cholesterol. Heart attack and stroke are both medical emergencies. There are many things that you can do to help prevent heart disease and stroke:  Have your blood pressure checked at least every 1-2 years. High blood pressure causes heart disease and increases the risk of stroke.  If you are 68-42 years old, ask your health care provider if you should take aspirin to prevent a heart attack or a stroke.  Do not use any tobacco products, including cigarettes, chewing tobacco, or electronic cigarettes. If you need help quitting, ask your health care provider.  It is important to eat a healthy diet and maintain a healthy weight. ? Be sure to include plenty of vegetables, fruits, low-fat dairy products, and  lean protein. ? Avoid eating foods that are high in solid fats, added sugars, or salt (sodium).  Get regular exercise. This is one of the most important things that you can do for your health. ? Try to exercise for at least 150 minutes each week. The type of exercise that you do should  increase your heart rate and make you sweat. This is known as moderate-intensity exercise. ? Try to do strengthening exercises at least twice each week. Do these in addition to the moderate-intensity exercise.  Know your numbers.Ask your health care provider to check your cholesterol and your blood glucose. Continue to have your blood tested as directed by your health care provider.  What should I know about cancer screening? There are several types of cancer. Take the following steps to reduce your risk and to catch any cancer development as early as possible. Breast Cancer  Practice breast self-awareness. ? This means understanding how your breasts normally appear and feel. ? It also means doing regular breast self-exams. Let your health care provider know about any changes, no matter how small.  If you are 57 or older, have a clinician do a breast exam (clinical breast exam or CBE) every year. Depending on your age, family history, and medical history, it may be recommended that you also have a yearly breast X-ray (mammogram).  If you have a family history of breast cancer, talk with your health care provider about genetic screening.  If you are at high risk for breast cancer, talk with your health care provider about having an MRI and a mammogram every year.  Breast cancer (BRCA) gene test is recommended for women who have family members with BRCA-related cancers. Results of the assessment will determine the need for genetic counseling and BRCA1 and for BRCA2 testing. BRCA-related cancers include these types: ? Breast. This occurs in males or females. ? Ovarian. ? Tubal. This may also be called fallopian tube cancer. ? Cancer of the abdominal or pelvic lining (peritoneal cancer). ? Prostate. ? Pancreatic.  Cervical, Uterine, and Ovarian Cancer Your health care provider may recommend that you be screened regularly for cancer of the pelvic organs. These include your ovaries, uterus,  and vagina. This screening involves a pelvic exam, which includes checking for microscopic changes to the surface of your cervix (Pap test).  For women ages 21-65, health care providers may recommend a pelvic exam and a Pap test every three years. For women ages 85-65, they may recommend the Pap test and pelvic exam, combined with testing for human papilloma virus (HPV), every five years. Some types of HPV increase your risk of cervical cancer. Testing for HPV may also be done on women of any age who have unclear Pap test results.  Other health care providers may not recommend any screening for nonpregnant women who are considered low risk for pelvic cancer and have no symptoms. Ask your health care provider if a screening pelvic exam is right for you.  If you have had past treatment for cervical cancer or a condition that could lead to cancer, you need Pap tests and screening for cancer for at least 20 years after your treatment. If Pap tests have been discontinued for you, your risk factors (such as having a new sexual partner) need to be reassessed to determine if you should start having screenings again. Some women have medical problems that increase the chance of getting cervical cancer. In these cases, your health care provider may recommend that you have  screening and Pap tests more often.  If you have a family history of uterine cancer or ovarian cancer, talk with your health care provider about genetic screening.  If you have vaginal bleeding after reaching menopause, tell your health care provider.  There are currently no reliable tests available to screen for ovarian cancer.  Lung Cancer Lung cancer screening is recommended for adults 13-74 years old who are at high risk for lung cancer because of a history of smoking. A yearly low-dose CT scan of the lungs is recommended if you:  Currently smoke.  Have a history of at least 30 pack-years of smoking and you currently smoke or have quit  within the past 15 years. A pack-year is smoking an average of one pack of cigarettes per day for one year.  Yearly screening should:  Continue until it has been 15 years since you quit.  Stop if you develop a health problem that would prevent you from having lung cancer treatment.  Colorectal Cancer  This type of cancer can be detected and can often be prevented.  Routine colorectal cancer screening usually begins at age 54 and continues through age 22.  If you have risk factors for colon cancer, your health care provider may recommend that you be screened at an earlier age.  If you have a family history of colorectal cancer, talk with your health care provider about genetic screening.  Your health care provider may also recommend using home test kits to check for hidden blood in your stool.  A small camera at the end of a tube can be used to examine your colon directly (sigmoidoscopy or colonoscopy). This is done to check for the earliest forms of colorectal cancer.  Direct examination of the colon should be repeated every 5-10 years until age 64. However, if early forms of precancerous polyps or small growths are found or if you have a family history or genetic risk for colorectal cancer, you may need to be screened more often.  Skin Cancer  Check your skin from head to toe regularly.  Monitor any moles. Be sure to tell your health care provider: ? About any new moles or changes in moles, especially if there is a change in a mole's shape or color. ? If you have a mole that is larger than the size of a pencil eraser.  If any of your family members has a history of skin cancer, especially at a young age, talk with your health care provider about genetic screening.  Always use sunscreen. Apply sunscreen liberally and repeatedly throughout the day.  Whenever you are outside, protect yourself by wearing long sleeves, pants, a wide-brimmed hat, and sunglasses.  What should I know  about osteoporosis? Osteoporosis is a condition in which bone destruction happens more quickly than new bone creation. After menopause, you may be at an increased risk for osteoporosis. To help prevent osteoporosis or the bone fractures that can happen because of osteoporosis, the following is recommended:  If you are 24-47 years old, get at least 1,000 mg of calcium and at least 600 mg of vitamin D per day.  If you are older than age 65 but younger than age 29, get at least 1,200 mg of calcium and at least 600 mg of vitamin D per day.  If you are older than age 77, get at least 1,200 mg of calcium and at least 800 mg of vitamin D per day.  Smoking and excessive alcohol intake increase the risk  of osteoporosis. Eat foods that are rich in calcium and vitamin D, and do weight-bearing exercises several times each week as directed by your health care provider. What should I know about how menopause affects my mental health? Depression may occur at any age, but it is more common as you become older. Common symptoms of depression include:  Low or sad mood.  Changes in sleep patterns.  Changes in appetite or eating patterns.  Feeling an overall lack of motivation or enjoyment of activities that you previously enjoyed.  Frequent crying spells.  Talk with your health care provider if you think that you are experiencing depression. What should I know about immunizations? It is important that you get and maintain your immunizations. These include:  Tetanus, diphtheria, and pertussis (Tdap) booster vaccine.  Influenza every year before the flu season begins.  Pneumonia vaccine.  Shingles vaccine.  Your health care provider may also recommend other immunizations. This information is not intended to replace advice given to you by your health care provider. Make sure you discuss any questions you have with your health care provider. Document Released: 07/10/2005 Document Revised: 12/06/2015  Document Reviewed: 02/19/2015 Elsevier Interactive Patient Education  2018 [employer].

## 2017-09-21 NOTE — Progress Notes (Signed)
Medical screening examination/treatment/procedure(s) were performed by non-physician practitioner and as supervising physician I was immediately available for consultation/collaboration. I agree with above. Elizabeth A Crawford, MD 

## 2017-09-22 ENCOUNTER — Telehealth: Payer: Self-pay | Admitting: *Deleted

## 2017-09-22 NOTE — Telephone Encounter (Signed)
During AWV, patient asked if she could get a refill for Xanax.  She explained that her last prescription was written for 90 pills which if she takes all that is prescribed for her to take this will last for a month. Patient adds she continues to have panic attacks and wants to ensure she does not run out of the medicine.

## 2017-09-23 ENCOUNTER — Encounter: Payer: Self-pay | Admitting: *Deleted

## 2017-09-23 ENCOUNTER — Telehealth: Payer: Self-pay | Admitting: *Deleted

## 2017-09-23 MED ORDER — METOPROLOL TARTRATE 50 MG PO TABS: 50.0000 mg | ORAL_TABLET | Freq: Once | ORAL | 0 refills | Status: DC

## 2017-09-23 NOTE — Telephone Encounter (Signed)
Pts coronary CT is scheduled for 10/12/17 at 1:30 pm.  Martin Majestic over the pts coronary CT instructions with her over the phone, and endorsed to her that this will be mailed to her address and sent to her via mychart.   Clarified with the pt her allergy to dye/contrast.  Pt states she doesn't think she is allergic to "contrast," but mostly IVP Dye containing blue shell fish in it.  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  With unknown status if she is "allergic to contrast or not," advised the pt to take benadryl 50 mg po 1 hour prior to her coronary CT.  Informed the pt that I will clarify with Dr Meda Coffee if she needs further prophylactic allergy meds, being she is highly allergic to Prednisone, and unknown status of contrast allergy.  Informed the pt that if Dr Meda Coffee recommends other allergy meds besides benadryl, then I will call her back and endorse this to her.  Otherwise if she doesn't hear from [place], to proceed with instructions already laid out as is.  Confirmed the pharmacy of choice with the pt for protocol for 1 dose metoprolol tartrate 50 mg po take 1 hour prior to coronary ct. Pt verbalized understanding and agrees with this plan.

## 2017-09-23 NOTE — Telephone Encounter (Signed)
Please arrive at the [facility] main entrance of [facility] on Tuesday 10/12/17 at 1:00 pm (30 minutes prior to test start time)  [facility] [place], [place] [address] [phone]  [facility] (First Floor).  Please follow these instructions carefully (unless otherwise directed):   On the Night Before the Test: . Drink plenty of water. . Do not consume any caffeinated/decaffeinated beverages or chocolate 12 hours prior to your test. . Do not take any antihistamines 12 hours prior to your test. DO NOT TAKE YOUR ZYRTEC  . If the patient has contrast allergy: UNKNOWN  about Contrast allergy, but is allergic to IVP Dye (if made with blue shellfish)  ? PATIENT IS ALLERGIC TO PREDNISONE--WILL CALL THE PATIENT BACK IF FURTHER ALLERGY MEDS ARE NEEDED, BESIDES BENADRYL.    1. Take Benadryl 50 mg 1 hour prior to test  TAKE ON 10/12/17 AT 12:30 PM   . Patient will need a ride after test due to Benadryl.  On the Day of the Test: . Drink plenty of water. Do not drink any water within one hour of the test. . Do not eat any food 4 hours prior to the test. . You may take your regular medications prior to the test. . IF NOT ON A BETA BLOCKER - Take 50 mg of lopressor (metoprolol) one hour before the test.   After the Test: . Drink plenty of water. . After receiving IV contrast, you may experience a mild flushed feeling. This is normal. . On occasion, you may experience a mild rash up to 24 hours after the test. This is not dangerous. If this occurs, you can take Benadryl 25 mg and increase your fluid intake. . If you experience trouble breathing, this can be serious. If it is severe call 911 IMMEDIATELY. If it is mild, please call our office.

## 2017-09-24 NOTE — Telephone Encounter (Signed)
Xanax last filled 09/13/17 - can only be filled once a month. Next rx not due until 10/11/17 - can not be filled early.

## 2017-10-07 DIAGNOSIS — H2512 Age-related nuclear cataract, left eye: Secondary | ICD-10-CM | POA: Diagnosis not present

## 2017-10-07 DIAGNOSIS — H25812 Combined forms of age-related cataract, left eye: Secondary | ICD-10-CM | POA: Diagnosis not present

## 2017-10-12 ENCOUNTER — Ambulatory Visit (HOSPITAL_COMMUNITY)
Admission: RE | Admit: 2017-10-12 | Discharge: 2017-10-12 | Disposition: A | Discharge status: Home / Self Care | Payer: Medicare Other | Source: Ambulatory Visit | Attending: Cardiology | Admitting: Cardiology

## 2017-10-12 DIAGNOSIS — R0609 Other forms of dyspnea: Secondary | ICD-10-CM

## 2017-10-12 DIAGNOSIS — R0602 Shortness of breath: Secondary | ICD-10-CM | POA: Insufficient documentation

## 2017-10-12 DIAGNOSIS — I2510 Atherosclerotic heart disease of native coronary artery without angina pectoris: Secondary | ICD-10-CM | POA: Diagnosis not present

## 2017-10-12 DIAGNOSIS — E785 Hyperlipidemia, unspecified: Secondary | ICD-10-CM

## 2017-10-12 DIAGNOSIS — R079 Chest pain, unspecified: Secondary | ICD-10-CM | POA: Diagnosis not present

## 2017-10-12 DIAGNOSIS — R0600 Dyspnea, unspecified: Secondary | ICD-10-CM

## 2017-10-12 DIAGNOSIS — I700 Atherosclerosis of aorta: Secondary | ICD-10-CM | POA: Diagnosis not present

## 2017-10-12 DIAGNOSIS — R600 Localized edema: Secondary | ICD-10-CM

## 2017-10-12 MED ORDER — IOPAMIDOL (ISOVUE-370) INJECTION 76%
INTRAVENOUS | Status: AC
  Administered 2017-10-12: 100 mL
  Filled 2017-10-12: qty 100

## 2017-10-12 MED ORDER — METOPROLOL TARTRATE 5 MG/5ML IV SOLN
5.0000 mg | INTRAVENOUS | Status: DC | PRN
  Administered 2017-10-12 (×4): 5 mg via INTRAVENOUS
  Filled 2017-10-12: qty 5

## 2017-10-12 MED ORDER — METOPROLOL TARTRATE 5 MG/5ML IV SOLN
INTRAVENOUS | Status: AC
  Filled 2017-10-12: qty 20

## 2017-10-12 MED ORDER — NITROGLYCERIN 0.4 MG SL SUBL
SUBLINGUAL_TABLET | SUBLINGUAL | Status: AC
  Filled 2017-10-12: qty 2

## 2017-10-12 MED ORDER — NITROGLYCERIN 0.4 MG SL SUBL
0.8000 mg | SUBLINGUAL_TABLET | Freq: Once | SUBLINGUAL | Status: AC
  Administered 2017-10-12: 0.8 mg via SUBLINGUAL

## 2017-10-12 NOTE — Progress Notes (Signed)
CT completed. Tolerated well. Pt with no signs of reaction to contrast. D/C home walking awake and alert. In no distress.

## 2017-10-28 DIAGNOSIS — H2511 Age-related nuclear cataract, right eye: Secondary | ICD-10-CM | POA: Diagnosis not present

## 2017-10-28 DIAGNOSIS — H25811 Combined forms of age-related cataract, right eye: Secondary | ICD-10-CM | POA: Diagnosis not present

## 2017-10-30 ENCOUNTER — Other Ambulatory Visit: Payer: Self-pay | Admitting: Internal Medicine

## 2017-10-30 DIAGNOSIS — F419 Anxiety disorder, unspecified: Secondary | ICD-10-CM

## 2017-11-01 NOTE — Telephone Encounter (Signed)
Controlled Substance Database checked. Last filled on 09/13/17 

## 2017-11-05 ENCOUNTER — Other Ambulatory Visit: Payer: Self-pay | Admitting: Internal Medicine

## 2017-11-10 ENCOUNTER — Other Ambulatory Visit: Payer: Self-pay | Admitting: Family Medicine

## 2017-11-24 ENCOUNTER — Ambulatory Visit (INDEPENDENT_AMBULATORY_CARE_PROVIDER_SITE_OTHER): Payer: Medicare Other | Admitting: Internal Medicine

## 2017-11-24 ENCOUNTER — Encounter: Payer: Self-pay | Admitting: Internal Medicine

## 2017-11-24 DIAGNOSIS — S30860A Insect bite (nonvenomous) of lower back and pelvis, initial encounter: Secondary | ICD-10-CM | POA: Diagnosis not present

## 2017-11-24 DIAGNOSIS — W57XXXA Bitten or stung by nonvenomous insect and other nonvenomous arthropods, initial encounter: Secondary | ICD-10-CM | POA: Diagnosis not present

## 2017-11-24 DIAGNOSIS — R39198 Other difficulties with micturition: Secondary | ICD-10-CM | POA: Diagnosis not present

## 2017-11-24 NOTE — Assessment & Plan Note (Signed)
Chronic Intermittent difficulty urinating-we will need to sit for a while before she is able to urinate Discussed referral to urology, but she deferred for now She would like to try natural remedies first, but will let me know if she would like to be referred

## 2017-11-24 NOTE — Patient Instructions (Signed)
You can apply steroid cream on the probable insect bite on your upper back.    Monitor, call if it does not resolve.

## 2017-11-24 NOTE — Progress Notes (Signed)
Subjective:    Patient ID: Laura Barnes, female    DOB: 1940/11/28, 77 y.o.   MRN: [id number]  HPI The patient is here for an acute visit.  About 2 months ago she saw something on her back and felt some itching and after itching the area it started to burn.   She looked in a mirror and it was a bright red spot.  She was unsure if she was bit or not.  She put castor oil on it.  It got better but is still there.   She was concerned whether it was something cancerous or insect bite which she should do for it.  Difficulty urinating: In the past we have discussed her difficulty urinating.  She is still having difficulty.  She has to sit for a long time before she is able to urinate.  Eventually it comes out.  She is not having any dysuria or blood in the urine.  I had referred her to urology, but her husband was very sick at that time and he needed that appointment so she gave it to him.  She is unsure if she wants to see a urologist or not.  She would prefer to see a female urologist if possible.   Medications and allergies reviewed with patient and updated if appropriate.  Patient Active Problem List   Diagnosis Date Noted  . Insect bite 11/24/2017  . Dizziness 08/11/2017  . Sacroiliac pain 06/17/2017  . Greater trochanteric bursitis of right hip 05/27/2017  . Vertigo 04/29/2017  . Dysphagia 10/28/2016  . Lump of skin 05/11/2016  . 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 Chronic meniscal tear of knee 03/05/2016  . Nausea 03/04/2016  . Lightheadedness 12/18/2015  . Osteopenia 12/03/2015  . Thyroid nodule 11/16/2015  . Bilateral leg edema 11/05/2015  . Anterior neck pain 11/05/2015  . Hormone replacement therapy (HRT) 11/05/2015  . Subacromial bursitis 09/16/2015  . 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 Trochanteric bursitis of both hips 03/12/2015  . Subacromial impingement of left shoulder 03/12/2015  . Hyperlipidemia 05/01/2014  . Chronic  pain syndrome 05/01/2014  . Abnormal breast finding 05/01/2014  . Lumbar radiculopathy 03/27/2014  . Left shoulder pain 11/07/2013  . Degeneration of intervertebral disc of cervical region 10/10/2013  . Fibromyalgia 09/06/2013  . Bilateral shoulder pain 09/06/2013  . Low back pain 08/12/2013  . Chronic cough 04/12/2013  . Sinusitis, chronic 04/12/2013  . Hypothyroidism 04/07/2012  . Chronic venous insufficiency 04/02/2010  . Enthesopathy of hip region 07/19/2007  . Osteoporosis 07/19/2007  . Anxiety 01/23/2007  . Depression 01/23/2007  . Migraine 01/23/2007  . NINAR (noninfectious nonallergic rhinitis) 01/23/2007  . Gastroesophageal reflux disease 01/23/2007  . IBS 01/23/2007    Current Outpatient Medications on File Prior to Visit  Medication Sig Dispense Refill  . 5-Hydroxytryptophan (5-HTP PO) Take 1 capsule by mouth 2 (two) times daily.    Marland Kitchen acetaminophen (TYLENOL) 500 MG tablet Take 1,000 mg by mouth every 6 (six) hours as needed for mild pain, moderate pain, fever or headache.    . ALPRAZolam (XANAX) 0.5 MG tablet TAKE 1 TABLET(0.5 MG) BY MOUTH THREE TIMES DAILY AS NEEDED FOR ANXIETY 90 tablet 0  . Amino Acids (AMINO ACID PO) Take 1 capsule by mouth 2 (two) times daily.    . Ascorbic Acid (VITAMIN C) 1000 MG tablet Take 1,000 mg by mouth 2 (two) times daily.    . B Complex-Biotin-FA (B COMPLETE) TABS Take 1  tablet by mouth daily. B Complete 100mg     . benzonatate (TESSALON) 200 MG capsule TAKE 1 CAPSULE(200 MG) BY MOUTH THREE TIMES DAILY AS NEEDED FOR COUGH 90 capsule 6  . buPROPion (WELLBUTRIN XL) 150 MG 24 hr tablet TAKE 1 TABLET(150 MG) BY MOUTH EVERY MORNING 90 tablet 1  . cetirizine (ZYRTEC) 10 MG tablet TAKE 1 TABLET (10 MG TOTAL) BY MOUTH DAILY. 30 tablet 11  . Cholecalciferol (VITAMIN D3) 1000 units CAPS Take 1,000 Units by mouth daily.    . Coenzyme Q10 (CO Q-10) 200 MG CAPS Take 1 capsule by mouth 2 (two) times daily.    Marland Kitchen ECHINACEA PO Take 1 tablet by mouth 2 (two)  times daily.    Marland Kitchen estradiol (ESTRACE) 1 MG tablet Take 0.5 tablets (0.5 mg total) by mouth daily. --- Office visit needed for further refills 45 tablet 1  . LECITHIN PO Take 1 tablet by mouth daily.    Marland Kitchen levothyroxine (SYNTHROID, LEVOTHROID) 75 MCG tablet TAKE 1 TABLET BY MOUTH EVERY DAY IN THE MORNING 90 tablet 1  . MAGNESIUM ASPARTATE PO Take 1 tablet by mouth 2 (two) times daily.    . meclizine (ANTIVERT) 25 MG tablet Take 1 tablet (25 mg total) by mouth 3 (three) times daily as needed for dizziness. 30 tablet 5  . Multiple Vitamin (MULTIVITAMIN WITH MINERALS) TABS tablet Take 1 tablet by mouth daily.    . Omega-3 Fatty Acids (SALMON OIL-1000 PO) Take 1,000 mg by mouth daily.    . ondansetron (ZOFRAN) 4 MG tablet Take 1 tablet (4 mg total) by mouth every 8 (eight) hours as needed for nausea or vomiting. 30 tablet 1  . ondansetron (ZOFRAN-ODT) 8 MG disintegrating tablet Take 1 tablet (8 mg total) by mouth every 8 (eight) hours as needed for nausea or vomiting. 30 tablet 2  . POTASSIUM AMINOBENZOATE PO Take 1 tablet by mouth daily.    . ranitidine (ZANTAC) 150 MG tablet Take 150 mg by mouth daily.    Marland Kitchen triamterene-hydrochlorothiazide (MAXZIDE-25) 37.5-25 MG tablet Take 1 tablet by mouth daily. 90 tablet 3  . TURMERIC PO Take 1 tablet by mouth 2 (two) times daily.     . vitamin E 400 UNIT capsule Take 400 Units by mouth daily.     No current facility-administered medications on file prior to visit.     Past Medical History:  Diagnosis Date  . ALLERGIC RHINITIS   . Allergy   . Anemia   . ANXIETY   . Arthritis    hands  . Bronchitis   . Chronic fatigue fibromyalgia syndrome   . Complication of anesthesia    says one time waking up she couldn't breathe, felt like her throat closing up  . DEPRESSION   . Enthesopathy of hip region   . Family history of anesthesia complication    sister with n/v  . FOOT PAIN, BILATERAL   . GERD   . Hiatal hernia   . HYPERLIPIDEMIA   . Hypothyroidism    . IBS   . MIGRAINE HEADACHE   . MIGRAINE, COMMON   . NECK MASS   . OSTEOPENIA   . OSTEOPOROSIS   . PLANTAR FASCIITIS   . RASH-NONVESICULAR   . SINUSITIS- ACUTE-NOS   . SKIN LESION   . VAGINITIS   . VENOUS INSUFFICIENCY, CHRONIC     Past Surgical History:  Procedure Laterality Date  . APPENDECTOMY    . BREAST ENHANCEMENT SURGERY    . BREAST IMPLANT REMOVAL    .  CHOLECYSTECTOMY    . COLONOSCOPY    . FOOT SURGERY  11/06/2016  . MASTECTOMY     bilateral for severe bilateral fibrocystic disease  . OOPHORECTOMY    . s/p neck lump removal    . SHOULDER SURGERY    . SINUS ENDO W/FUSION Right 06/30/2013   Procedure: RIGHT ENDOSCOPIC SPHENOIDECTOMY WITH FUSION SCAN;  Surgeon: [name], MD;  Location: [facility];  Service: ENT;  Laterality: Right;  . TONSILLECTOMY    . VAGINAL HYSTERECTOMY      Social History   Socioeconomic History  . Marital status: Widowed    Spouse name: Not on file  . Number of children: 2  . Years of education: Not on file  . Highest education level: Not on file  Occupational History  . Occupation: retired Water quality scientist  . Financial resource strain: Not hard at all  . Food insecurity:    Worry: Never true    Inability: Never true  . Transportation needs:    Medical: No    Non-medical: No  Tobacco Use  . Smoking status: Never Smoker  . Smokeless tobacco: Never Used  Substance and Sexual Activity  . Alcohol use: No  . Drug use: No  . Sexual activity: Not Currently  Lifestyle  . Physical activity:    Days per week: 0 days    Minutes per session: 0 min  . Stress: Not at all  Relationships  . Social connections:    Talks on phone: More than three times a week    Gets together: More than three times a week    Attends religious service: More than 4 times per year    Active member of club or organization: Not on file    Attends meetings of clubs or organizations: More than 4 times per year    Relationship status: Widowed  Other  Topics Concern  . Not on file  Social History Narrative   Has 2 biological children and 1 step child    Family History  Problem Relation Age of Onset  . Diabetes Mother   . Heart disease Father   . Diabetes Sister   . Breast cancer Maternal Grandmother   . Heart disease Maternal Uncle        x 2  . Diabetes Other        multi relatives on father side  . Heart disease Maternal Aunt   . Kidney disease Paternal Uncle        questionable  . Colon cancer Neg Hx   . Colon polyps Neg Hx   . Rectal cancer Neg Hx   . Stomach cancer Neg Hx     Review of Systems  Constitutional: Positive for fatigue. Negative for chills and fever.  Skin: Positive for color change. Negative for rash and wound.       Objective:   Vitals:   11/24/17 1559  BP: 124/76  Pulse: 81  Resp: 16  Temp: 98.4 F (36.9 C)  SpO2: 97%   BP Readings from Last 3 Encounters:  11/24/17 124/76  10/12/17 129/76  09/21/17 (!) 126/58   Wt Readings from Last 3 Encounters:  11/24/17 140 lb (63.5 kg)  09/21/17 145 lb (65.8 kg)  09/13/17 141 lb (64 kg)   Body mass index is 27.34 kg/m.   Physical Exam  Constitutional: She appears well-developed and well-nourished. No distress.  HENT:  Head: Normocephalic and atraumatic.  Skin: Skin is warm and dry. She is not diaphoretic.  There is erythema (Minimal ring of hyperpigmentation with very mild erythema with slightly irregular borders left upper back with central clearing, border slightly dry.  Overall looks very mild and appears to be resolving, nonraised, nontender, no fluctuance or induration).           Assessment & Plan:    See Problem List for Assessment and Plan of chronic medical problems.

## 2017-11-24 NOTE — Assessment & Plan Note (Signed)
Probable insect bite left upper back Resolving on own Can apply steroid cream Monitor

## 2017-11-30 DIAGNOSIS — J301 Allergic rhinitis due to pollen: Secondary | ICD-10-CM | POA: Diagnosis not present

## 2017-11-30 DIAGNOSIS — J3089 Other allergic rhinitis: Secondary | ICD-10-CM | POA: Diagnosis not present

## 2017-12-06 DIAGNOSIS — M9905 Segmental and somatic dysfunction of pelvic region: Secondary | ICD-10-CM | POA: Diagnosis not present

## 2017-12-06 DIAGNOSIS — M9904 Segmental and somatic dysfunction of sacral region: Secondary | ICD-10-CM | POA: Diagnosis not present

## 2017-12-06 DIAGNOSIS — M9903 Segmental and somatic dysfunction of lumbar region: Secondary | ICD-10-CM | POA: Diagnosis not present

## 2017-12-06 DIAGNOSIS — M5136 Other intervertebral disc degeneration, lumbar region: Secondary | ICD-10-CM | POA: Diagnosis not present

## 2017-12-07 DIAGNOSIS — M9904 Segmental and somatic dysfunction of sacral region: Secondary | ICD-10-CM | POA: Diagnosis not present

## 2017-12-07 DIAGNOSIS — M5136 Other intervertebral disc degeneration, lumbar region: Secondary | ICD-10-CM | POA: Diagnosis not present

## 2017-12-07 DIAGNOSIS — M9903 Segmental and somatic dysfunction of lumbar region: Secondary | ICD-10-CM | POA: Diagnosis not present

## 2017-12-07 DIAGNOSIS — M9905 Segmental and somatic dysfunction of pelvic region: Secondary | ICD-10-CM | POA: Diagnosis not present

## 2017-12-09 ENCOUNTER — Telehealth: Payer: Self-pay

## 2017-12-09 NOTE — Telephone Encounter (Signed)
Patient called in to move her appointment next Thursday up. Patient states that she is having a flare up of her fibromyalgia and that her back is bothering her. Has seen a chiropractor twice this week who says that he cannot do anything more until she sees Dr. Tamala Julian next Thursday. Have an opening on Monday at 1:30 that is offered to patient. Patient states that she needs to be seen sooner. Recommend to patient that if her pain is unmanageable until appointment on Monday that she needs to go into ER. Patient states that she has been using another person's pain medication as well. I tell her that we do not recommend that she use any one else's medications and that if she cannot manage her pain from now until Monday that she should be seen in the emergency room. Patient states that she is not going to the emergency room and will take the appointment on Monday.

## 2017-12-12 NOTE — Progress Notes (Signed)
[name] [address], [place] [address] Phone: [phone] Subjective:     CC: Back pain   ION:[id number]  Laura Barnes is a 77 y.o. female coming in with complaint of back pain.  Has had significant amount of pain.  Spinal stenosis noted from L2-L3 previously.  Has responded to an epidural.  This was greater than 4 months ago.  Patient is having worsening discomfort and pain again.  Patient states it is affecting daily activities.  Patient states that she was doing very well and then 1 day had 10 out of 10 pain.     Past Medical History:  Diagnosis Date  . ALLERGIC RHINITIS   . Allergy   . Anemia   . ANXIETY   . Arthritis    hands  . Bronchitis   . Chronic fatigue fibromyalgia syndrome   . Complication of anesthesia    says one time waking up she couldn't breathe, felt like her throat closing up  . DEPRESSION   . Enthesopathy of hip region   . Family history of anesthesia complication    sister with n/v  . FOOT PAIN, BILATERAL   . GERD   . Hiatal hernia   . HYPERLIPIDEMIA   . Hypothyroidism   . IBS   . MIGRAINE HEADACHE   . MIGRAINE, COMMON   . NECK MASS   . OSTEOPENIA   . OSTEOPOROSIS   . PLANTAR FASCIITIS   . RASH-NONVESICULAR   . SINUSITIS- ACUTE-NOS   . SKIN LESION   . VAGINITIS   . VENOUS INSUFFICIENCY, CHRONIC    Past Surgical History:  Procedure Laterality Date  . APPENDECTOMY    . BREAST ENHANCEMENT SURGERY    . BREAST IMPLANT REMOVAL    . CHOLECYSTECTOMY    . COLONOSCOPY    . FOOT SURGERY  11/06/2016  . MASTECTOMY     bilateral for severe bilateral fibrocystic disease  . OOPHORECTOMY    . s/p neck lump removal    . SHOULDER SURGERY    . SINUS ENDO W/FUSION Right 06/30/2013   Procedure: RIGHT ENDOSCOPIC SPHENOIDECTOMY WITH FUSION SCAN;  Surgeon: [name], MD;  Location: [facility];  Service: ENT;  Laterality: Right;  . TONSILLECTOMY    . VAGINAL HYSTERECTOMY     Social History    Socioeconomic History  . Marital status: Widowed    Spouse name: Not on file  . Number of children: 2  . Years of education: Not on file  . Highest education level: Not on file  Occupational History  . Occupation: retired Water quality scientist  . Financial resource strain: Not hard at all  . Food insecurity:    Worry: Never true    Inability: Never true  . Transportation needs:    Medical: No    Non-medical: No  Tobacco Use  . Smoking status: Never Smoker  . Smokeless tobacco: Never Used  Substance and Sexual Activity  . Alcohol use: No  . Drug use: No  . Sexual activity: Not Currently  Lifestyle  . Physical activity:    Days per week: 0 days    Minutes per session: 0 min  . Stress: Not at all  Relationships  . Social connections:    Talks on phone: More than three times a week    Gets together: More than three times a week    Attends religious service: More than 4 times per year    Active member of  club or organization: Not on file    Attends meetings of clubs or organizations: More than 4 times per year    Relationship status: Widowed  Other Topics Concern  . Not on file  Social History Narrative   Has 2 biological children and 1 step child   Allergies  Allergen Reactions  . Prednisone Anaphylaxis    Patient unable to remember why she needed to steroid shot but just knows that when she got back to work she started having a lot of trouble breathing.  (jkl 05/04/14)  . Amoxicillin-Pot Clavulanate Hives and Diarrhea    Has patient had a PCN reaction causing immediate rash, facial/tongue/throat swelling, SOB or lightheadedness with hypotension: Unknown Has patient had a PCN reaction causing severe rash involving mucus membranes or skin necrosis: Unknown Has patient had a PCN reaction that required hospitalization: Unknown Has patient had a PCN reaction occurring within the last 10 years: Unknown If all of the above answers are "NO", then may proceed with  Cephalosporin use.   . Aspirin Other (See Comments)    Stomach ache and bleed  . Erythromycin Diarrhea  . Latex Other (See Comments)    blisters  . Levofloxacin Other (See Comments)    Headaches, GI upset  . Nsaids Other (See Comments)    stomach bleeding per pt  . Statins Nausea Only and Other (See Comments)    Headache, upset stomach, joint hurt, heartburn  . Sumatriptan Hives and Palpitations  . Adhesive [Tape] Other (See Comments)    blisters  . Ivp Dye [Iodinated Diagnostic Agents]     If made with blue shellfish then CAN NOT have this type of dye.  Pt was given contrast 10/12/17, not premedicated, had NO COMPLICATIONS- BLO, CT  . Methylphenidate Hcl     Unknown reaction  . Mirtazapine     Unknown reaction  . Shellfish Allergy     Blue shellfish  . Soy Allergy     Unknown reaction  . Doxycycline Diarrhea  . Hydrocodone Itching  . Hydrocodone-Acetaminophen Itching  . Rofecoxib Nausea Only  . Sertraline Hcl Nausea Only  . Sulfonamide Derivatives Itching   Family History  Problem Relation Age of Onset  . Diabetes Mother   . Heart disease Father   . Diabetes Sister   . Breast cancer Maternal Grandmother   . Heart disease Maternal Uncle        x 2  . Diabetes Other        multi relatives on father side  . Heart disease Maternal Aunt   . Kidney disease Paternal Uncle        questionable  . Colon cancer Neg Hx   . Colon polyps Neg Hx   . Rectal cancer Neg Hx   . Stomach cancer Neg Hx      Past medical history, social, surgical and family history all reviewed in electronic medical record.  No pertanent information unless stated regarding to the chief complaint.   Review of Systems:Review of systems updated and as accurate as of 12/13/17  No headache, visual changes, nausea, vomiting, diarrhea, constipation, dizziness, skin rash, fevers, chills, night sweats, weight loss, swollen lymph nodes, chest pain, shortness of breath, mood changes.  Positive abdominal pain,  body aches, joint swelling and muscle aches  Objective  Blood pressure 140/80, pulse (!) 108, height 5' (1.524 m), weight 137 lb (62.1 kg), SpO2 96 %. Systems examined below as of 12/13/17   General: No apparent distress alert and oriented x3  mood and affect normal, dressed appropriately.  HEENT: Pupils equal, extraocular movements intact  Respiratory: Patient's speak in full sentences and does not appear short of breath  Cardiovascular: Trace lower extremity edema, non tender, no erythema  Skin: Warm dry intact with no signs of infection or rash on extremities or on axial skeleton.  Abdomen: Soft nontender  Neuro: Cranial nerves II through XII are intact, neurovascularly intact in all extremities with  2+ pulses.  Lymph: No lymphadenopathy of posterior or anterior cervical chain or axillae bilaterally.  Gait antalgic MSK:  tender with mild limited range of motion and good stability and symmetric strength and tone of shoulders, elbows, wrist, hip, knee and ankles bilaterally.  Arthritic changes of multiple joints Back exam shows some degenerative scoliosis with loss of lordosis.  Positive straight leg test actually now bilaterally.  4 out of 5 strength in lower extremities but seems to be symmetric.  Deep tendon reflexes 1+ in the right           Impression and Recommendations:     This case required medical decision making of moderate complexity.      Note: This dictation was prepared with Dragon dictation along with smaller phrase technology. Any transcriptional errors that result from this process are unintentional.

## 2017-12-13 ENCOUNTER — Ambulatory Visit: Payer: Medicare Other | Admitting: Family Medicine

## 2017-12-13 ENCOUNTER — Telehealth: Payer: Self-pay | Admitting: Internal Medicine

## 2017-12-13 ENCOUNTER — Encounter: Payer: Self-pay | Admitting: Family Medicine

## 2017-12-13 DIAGNOSIS — M5416 Radiculopathy, lumbar region: Secondary | ICD-10-CM | POA: Diagnosis not present

## 2017-12-13 DIAGNOSIS — M47816 Spondylosis without myelopathy or radiculopathy, lumbar region: Secondary | ICD-10-CM

## 2017-12-13 DIAGNOSIS — F419 Anxiety disorder, unspecified: Secondary | ICD-10-CM

## 2017-12-13 MED ORDER — KETOROLAC TROMETHAMINE 60 MG/2ML IM SOLN
60.0000 mg | Freq: Once | INTRAMUSCULAR | Status: AC
  Administered 2017-12-13: 60 mg via INTRAMUSCULAR

## 2017-12-13 MED ORDER — ALPRAZOLAM 0.5 MG PO TABS: ORAL_TABLET | ORAL | 2 refills | Status: DC

## 2017-12-13 MED ORDER — AMBULATORY NON FORMULARY MEDICATION: 0 refills | Status: DC

## 2017-12-13 NOTE — Telephone Encounter (Signed)
Patient requesting 3 month supply of alprazolam to be sent to [facility] on [address]

## 2017-12-13 NOTE — Telephone Encounter (Signed)
[place] Controlled Substance Database checked. Last filled on 11/01/17. Please advise if okay to fill as 90 day supply

## 2017-12-13 NOTE — Telephone Encounter (Signed)
I will send in with refills so she can get it when she needs it.

## 2017-12-13 NOTE — Assessment & Plan Note (Signed)
Worsening pain again.  Still seems to be moving on to 3 L4 nerve root on the right side.  And patient will be set up for an epidural that patient respond to previously.  We discussed icing regimen, home exercise, which activities to do which wants to avoid.  Patient is to increase activity slowly over the course the next several days.  Follow-up with me again in 4 to 8 weeks or 3 weeks after the epidural.  Toradol injection given today.

## 2017-12-13 NOTE — Assessment & Plan Note (Signed)
Worsening radicular improving with some mild increase in weakness.  I do think that this is worsening.  Patient has responded to an epidural in her back previously many months ago.  I would like to repeat the same one that she had improvement with.  Discussed icing regimen and home exercises.  Follow-up again in 4 weeks

## 2017-12-13 NOTE — Patient Instructions (Signed)
Good to see you  We gave you a Toradol injection today  We order another epidural and call [phone] to set it up and they will take care of you  See me again 3 weeks after the epidural

## 2017-12-14 NOTE — Telephone Encounter (Signed)
Spoke with pt to inform.  

## 2017-12-16 ENCOUNTER — Ambulatory Visit: Payer: Medicare Other | Admitting: Family Medicine

## 2017-12-16 ENCOUNTER — Telehealth: Payer: Self-pay | Admitting: Family Medicine

## 2017-12-16 DIAGNOSIS — M9905 Segmental and somatic dysfunction of pelvic region: Secondary | ICD-10-CM | POA: Diagnosis not present

## 2017-12-16 DIAGNOSIS — M5136 Other intervertebral disc degeneration, lumbar region: Secondary | ICD-10-CM | POA: Diagnosis not present

## 2017-12-16 DIAGNOSIS — M9903 Segmental and somatic dysfunction of lumbar region: Secondary | ICD-10-CM | POA: Diagnosis not present

## 2017-12-16 DIAGNOSIS — M9904 Segmental and somatic dysfunction of sacral region: Secondary | ICD-10-CM | POA: Diagnosis not present

## 2017-12-16 NOTE — Telephone Encounter (Signed)
Copied from [facility] [id number]. Topic: General - Other >> Dec 16, 2017  4:11 PM Cecelia Byars, NT wrote: Reason for CRM: Patient said she was given a prescription for a walker , she needs for it to rewritten to say  seat Rolator  please  call  her at  [phone] when ready ,

## 2017-12-17 ENCOUNTER — Telehealth: Payer: Self-pay | Admitting: Internal Medicine

## 2017-12-17 NOTE — Telephone Encounter (Signed)
Copied from [place] [id number]. Topic: Inquiry >> Dec 17, 2017  1:39 PM Mylinda Latina, NT wrote: Reason for CRM: patient called and state she would like the nurse to call her. She didn't disclose what she needed or wanted to discuss. CB# [phone]

## 2017-12-17 NOTE — Telephone Encounter (Signed)
OK to rewrite rx for walker to state rolator?

## 2017-12-17 NOTE — Telephone Encounter (Signed)
I tried to call patient, left message for patient to call back

## 2017-12-20 MED ORDER — ROLLATOR MISC: 0 refills | Status: DC

## 2017-12-20 NOTE — Telephone Encounter (Signed)
RX printed and signed. Pt contacted and informed rx was ready for pick up.

## 2017-12-20 NOTE — Telephone Encounter (Signed)
Yes - that would be fine

## 2017-12-22 ENCOUNTER — Ambulatory Visit
Admission: RE | Admit: 2017-12-22 | Discharge: 2017-12-22 | Disposition: A | Discharge status: Home / Self Care | Payer: Medicare Other | Source: Ambulatory Visit | Attending: Family Medicine | Admitting: Family Medicine

## 2017-12-22 DIAGNOSIS — M5126 Other intervertebral disc displacement, lumbar region: Secondary | ICD-10-CM | POA: Diagnosis not present

## 2017-12-22 DIAGNOSIS — M5416 Radiculopathy, lumbar region: Secondary | ICD-10-CM

## 2017-12-22 MED ORDER — IOPAMIDOL (ISOVUE-M 200) INJECTION 41%
1.0000 mL | Freq: Once | INTRAMUSCULAR | Status: AC
  Administered 2017-12-22: 1 mL via EPIDURAL

## 2017-12-22 MED ORDER — METHYLPREDNISOLONE ACETATE 40 MG/ML INJ SUSP (RADIOLOG
120.0000 mg | Freq: Once | INTRAMUSCULAR | Status: AC
  Administered 2017-12-22: 120 mg via EPIDURAL

## 2017-12-22 NOTE — Discharge Instructions (Signed)

## 2017-12-24 ENCOUNTER — Encounter: Payer: Self-pay | Admitting: Family Medicine

## 2018-01-11 NOTE — Progress Notes (Signed)
[name] [address], [place] [address] Phone: [phone] Subjective:    CC: Back pain  [id number]  Laura Barnes is a 77 y.o. female coming in with complaint of back pain. She has pain on the right lumbar spine that started to increase since yesterday. Patient does note cutting down some shrubs on Sunday. Notes that both feet are trying to "go to sleep today." Pain is 6/10 today patient has known arthritic changes spinal stenosis mostly at L2-L3.  Had responded well to the first epidural but not as much as his last one was 1 month ago.  Patient wanted to know what she can do. Patient also states that her fibromyalgia seems to be increasing significantly recently.  .    Past Medical History:  Diagnosis Date  . ALLERGIC RHINITIS   . Allergy   . Anemia   . ANXIETY   . Arthritis    hands  . Bronchitis   . Chronic fatigue fibromyalgia syndrome   . Complication of anesthesia    says one time waking up she couldn't breathe, felt like her throat closing up  . DEPRESSION   . Enthesopathy of hip region   . Family history of anesthesia complication    sister with n/v  . FOOT PAIN, BILATERAL   . GERD   . Hiatal hernia   . HYPERLIPIDEMIA   . Hypothyroidism   . IBS   . MIGRAINE HEADACHE   . MIGRAINE, COMMON   . NECK MASS   . OSTEOPENIA   . OSTEOPOROSIS   . PLANTAR FASCIITIS   . RASH-NONVESICULAR   . SINUSITIS- ACUTE-NOS   . SKIN LESION   . VAGINITIS   . VENOUS INSUFFICIENCY, CHRONIC    Past Surgical History:  Procedure Laterality Date  . APPENDECTOMY    . BREAST ENHANCEMENT SURGERY    . BREAST IMPLANT REMOVAL    . CHOLECYSTECTOMY    . COLONOSCOPY    . FOOT SURGERY  11/06/2016  . MASTECTOMY     bilateral for severe bilateral fibrocystic disease  . OOPHORECTOMY    . s/p neck lump removal    . SHOULDER SURGERY    . SINUS ENDO W/FUSION Right 06/30/2013   Procedure: RIGHT ENDOSCOPIC SPHENOIDECTOMY WITH FUSION SCAN;   Surgeon: [name], MD;  Location: [facility];  Service: ENT;  Laterality: Right;  . TONSILLECTOMY    . VAGINAL HYSTERECTOMY     Social History   Socioeconomic History  . Marital status: Widowed    Spouse name: Not on file  . Number of children: 2  . Years of education: Not on file  . Highest education level: Not on file  Occupational History  . Occupation: retired Water quality scientist  . Financial resource strain: Not hard at all  . Food insecurity:    Worry: Never true    Inability: Never true  . Transportation needs:    Medical: No    Non-medical: No  Tobacco Use  . Smoking status: Never Smoker  . Smokeless tobacco: Never Used  Substance and Sexual Activity  . Alcohol use: No  . Drug use: No  . Sexual activity: Not Currently  Lifestyle  . Physical activity:    Days per week: 0 days    Minutes per session: 0 min  . Stress: Not at all  Relationships  . Social connections:    Talks on phone: More than three times a week    Gets together:  More than three times a week    Attends religious service: More than 4 times per year    Active member of club or organization: Not on file    Attends meetings of clubs or organizations: More than 4 times per year    Relationship status: Widowed  Other Topics Concern  . Not on file  Social History Narrative   Has 2 biological children and 1 step child   Allergies  Allergen Reactions  . Prednisone Anaphylaxis    Patient unable to remember why she needed to steroid shot but just knows that when she got back to work she started having a lot of trouble breathing.  (jkl 05/04/14)  . Amoxicillin-Pot Clavulanate Hives and Diarrhea    Has patient had a PCN reaction causing immediate rash, facial/tongue/throat swelling, SOB or lightheadedness with hypotension: Unknown Has patient had a PCN reaction causing severe rash involving mucus membranes or skin necrosis: Unknown Has patient had a PCN reaction that required hospitalization:  Unknown Has patient had a PCN reaction occurring within the last 10 years: Unknown If all of the above answers are "NO", then may proceed with Cephalosporin use.   . Aspirin Other (See Comments)    Stomach ache and bleed  . Erythromycin Diarrhea  . Levofloxacin Other (See Comments)    Headaches, GI upset  . Nsaids Other (See Comments)    stomach bleeding per pt  . Statins Nausea Only and Other (See Comments)    Headache, upset stomach, joint hurt, heartburn  . Sumatriptan Hives and Palpitations  . Adhesive [Tape] Other (See Comments)    blisters  . Ivp Dye [Iodinated Diagnostic Agents]     If made with blue shellfish then CAN NOT have this type of dye.  Pt was given contrast 10/12/17, not premedicated, had NO COMPLICATIONS- BLO, CT  . Methylphenidate Hcl     Unknown reaction  . Mirtazapine     Unknown reaction  . Shellfish Allergy     Blue shellfish  . Soy Allergy     Unknown reaction  . Doxycycline Diarrhea  . Hydrocodone Itching  . Hydrocodone-Acetaminophen Itching  . Latex Other (See Comments)    blisters  . Rofecoxib Nausea Only  . Sertraline Hcl Nausea Only  . Sulfonamide Derivatives Itching   Family History  Problem Relation Age of Onset  . Diabetes Mother   . Heart disease Father   . Diabetes Sister   . Breast cancer Maternal Grandmother   . Heart disease Maternal Uncle        x 2  . Diabetes Other        multi relatives on father side  . Heart disease Maternal Aunt   . Kidney disease Paternal Uncle        questionable  . Colon cancer Neg Hx   . Colon polyps Neg Hx   . Rectal cancer Neg Hx   . Stomach cancer Neg Hx      Past medical history, social, surgical and family history all reviewed in electronic medical record.  No pertanent information unless stated regarding to the chief complaint.   Review of Systems:Review of systems updated and as accurate as of 01/12/18  No , visual changes, nausea, vomiting, diarrhea, constipation, dizziness, abdominal  pain, skin rash, fevers, chills, night sweats, weight loss, swollen lymph nodes, chest pain, shortness of breath, mood changes.  Positive muscle aches, body aches, joint swelling, headache  Objective  Blood pressure 118/90, pulse 84, height 5' (1.524 m), weight  135 lb (61.2 kg), SpO2 97 %. Systems examined below as of 01/12/18   General: No apparent distress alert and oriented x3 mood and affect normal, dressed appropriately.  HEENT: Pupils equal, extraocular movements intact  Respiratory: Patient's speak in full sentences and does not appear short of breath  Cardiovascular: No lower extremity edema, non tender, no erythema  Skin: Warm dry intact with no signs of infection or rash on extremities or on axial skeleton.  Abdomen: Soft nontender  Neuro: Cranial nerves II through XII are intact, neurovascularly intact in all extremities with 2+ DTRs and 2+ pulses.  Lymph: No lymphadenopathy of posterior or anterior cervical chain or axillae bilaterally.  Gait normal with good balance and coordination.  MSK:  tender in out of proportion to the amount of palpation with mild limited range of motion and good stability and symmetric strength and tone of shoulders, elbows, wrist, hip, knee and ankles bilaterally.   Back exam does show some degenerative scoliosis and loss of lordosis.  Patient does have severe tenderness to palpation even to light palpation that is out of proportion.  Severe pain though over the right sacroiliac joint.  Straight leg test shows positive radicular symptoms per patient.  Seems to be neurovascularly intact distally.  Pain in the legs bilaterally as well.      Impression and Recommendations:     This case required medical decision making of moderate complexity.      Note: This dictation was prepared with Dragon dictation along with smaller phrase technology. Any transcriptional errors that result from this process are unintentional.

## 2018-01-12 ENCOUNTER — Ambulatory Visit: Payer: Medicare Other | Admitting: Family Medicine

## 2018-01-12 ENCOUNTER — Encounter: Payer: Self-pay | Admitting: Family Medicine

## 2018-01-12 VITALS — BP 118/90 | HR 84 | Ht 60.0 in | Wt 135.0 lb

## 2018-01-12 DIAGNOSIS — M797 Fibromyalgia: Secondary | ICD-10-CM

## 2018-01-12 DIAGNOSIS — M5416 Radiculopathy, lumbar region: Secondary | ICD-10-CM | POA: Diagnosis not present

## 2018-01-12 MED ORDER — KETOROLAC TROMETHAMINE 60 MG/2ML IM SOLN
60.0000 mg | Freq: Once | INTRAMUSCULAR | Status: AC
  Administered 2018-01-12: 60 mg via INTRAMUSCULAR

## 2018-01-12 NOTE — Patient Instructions (Addendum)
Good to see you  We will try another injection.  Toradol  Given today as well  We will hold on any other medicine  See me again 3 weeks after the injection again

## 2018-01-12 NOTE — Assessment & Plan Note (Signed)
Patient continues to have some radicular symptoms.  I do believe the patient is also having exacerbation of her fibromyalgia.  Significant number of allergies.  Has responded to injections previously in the back and would like to repeat this as well.  Toradol injection given today.  We discussed once again about different medications.  Patient brought up the idea of going back to pain medications which I declined to fill.  Discussed with patient about trying to be active or formal physical therapy.  Patient will follow-up with me again in 4 to 8 weeks  Spent  25 minutes with patient face-to-face and had greater than 50% of counseling including as described above in assessment and plan.

## 2018-02-01 DIAGNOSIS — H353131 Nonexudative age-related macular degeneration, bilateral, early dry stage: Secondary | ICD-10-CM | POA: Diagnosis not present

## 2018-02-01 DIAGNOSIS — H43813 Vitreous degeneration, bilateral: Secondary | ICD-10-CM | POA: Diagnosis not present

## 2018-02-08 ENCOUNTER — Ambulatory Visit
Admission: RE | Admit: 2018-02-08 | Discharge: 2018-02-08 | Disposition: A | Discharge status: Home / Self Care | Payer: Medicare Other | Source: Ambulatory Visit | Attending: Family Medicine | Admitting: Family Medicine

## 2018-02-08 DIAGNOSIS — M47817 Spondylosis without myelopathy or radiculopathy, lumbosacral region: Secondary | ICD-10-CM | POA: Diagnosis not present

## 2018-02-08 DIAGNOSIS — M5416 Radiculopathy, lumbar region: Secondary | ICD-10-CM

## 2018-02-08 MED ORDER — IOPAMIDOL (ISOVUE-M 200) INJECTION 41%
1.0000 mL | Freq: Once | INTRAMUSCULAR | Status: AC
  Administered 2018-02-08: 1 mL via EPIDURAL

## 2018-02-08 MED ORDER — METHYLPREDNISOLONE ACETATE 40 MG/ML INJ SUSP (RADIOLOG
120.0000 mg | Freq: Once | INTRAMUSCULAR | Status: AC
  Administered 2018-02-08: 120 mg via EPIDURAL

## 2018-02-08 NOTE — Discharge Instructions (Signed)

## 2018-02-18 ENCOUNTER — Other Ambulatory Visit: Payer: Medicare Other

## 2018-02-25 DIAGNOSIS — H35362 Drusen (degenerative) of macula, left eye: Secondary | ICD-10-CM | POA: Diagnosis not present

## 2018-02-25 DIAGNOSIS — H04122 Dry eye syndrome of left lacrimal gland: Secondary | ICD-10-CM | POA: Diagnosis not present

## 2018-03-01 ENCOUNTER — Ambulatory Visit: Payer: Medicare Other | Admitting: Internal Medicine

## 2018-03-02 NOTE — Progress Notes (Signed)
[name] [address], [place] [address] Phone: [phone] Subjective:    I Laura Barnes am serving as a Education administrator for Dr. Hulan Saas.    CC: Back pain  [id number]  Laura Barnes is a 77 y.o. female coming in with complaint of back pain. Wants to talk about fibromyalgia today. Has been in severe pain for weeks. Wakes her up every night. Can't sleep flat in her bed. Right hip pain radiates to the knee.  Last epidural was in February 08, 2018.  MRI of spine shows lumbar spondylosis with degenerative disc disease with disc protrusion at L2-L3 and L3-L4.  Patient feels that the back pain did get a little better with the epidural but her fibromyalgia is significantly worsening.  She feels that her Dilaudid is not seeming to help the pain and is wondering what else she can do.  States is frustrated because she continues to not be able to do daily activities.  Pain though over the lateral hip seems to be the worst at the moment and waking her up at night.      Past Medical History:  Diagnosis Date  . ALLERGIC RHINITIS   . Allergy   . Anemia   . ANXIETY   . Arthritis    hands  . Bronchitis   . Chronic fatigue fibromyalgia syndrome   . Complication of anesthesia    says one time waking up she couldn't breathe, felt like her throat closing up  . DEPRESSION   . Enthesopathy of hip region   . Family history of anesthesia complication    sister with n/v  . FOOT PAIN, BILATERAL   . GERD   . Hiatal hernia   . HYPERLIPIDEMIA   . Hypothyroidism   . IBS   . MIGRAINE HEADACHE   . MIGRAINE, COMMON   . NECK MASS   . OSTEOPENIA   . OSTEOPOROSIS   . PLANTAR FASCIITIS   . RASH-NONVESICULAR   . SINUSITIS- ACUTE-NOS   . SKIN LESION   . VAGINITIS   . VENOUS INSUFFICIENCY, CHRONIC    Past Surgical History:  Procedure Laterality Date  . APPENDECTOMY    . BREAST ENHANCEMENT SURGERY    . BREAST IMPLANT REMOVAL    . CHOLECYSTECTOMY    .  COLONOSCOPY    . FOOT SURGERY  11/06/2016  . MASTECTOMY     bilateral for severe bilateral fibrocystic disease  . OOPHORECTOMY    . s/p neck lump removal    . SHOULDER SURGERY    . SINUS ENDO W/FUSION Right 06/30/2013   Procedure: RIGHT ENDOSCOPIC SPHENOIDECTOMY WITH FUSION SCAN;  Surgeon: [name], MD;  Location: [facility];  Service: ENT;  Laterality: Right;  . TONSILLECTOMY    . VAGINAL HYSTERECTOMY     Social History   Socioeconomic History  . Marital status: Widowed    Spouse name: Not on file  . Number of children: 2  . Years of education: Not on file  . Highest education level: Not on file  Occupational History  . Occupation: retired Water quality scientist  . Financial resource strain: Not hard at all  . Food insecurity:    Worry: Never true    Inability: Never true  . Transportation needs:    Medical: No    Non-medical: No  Tobacco Use  . Smoking status: Never Smoker  . Smokeless tobacco: Never Used  Substance and Sexual Activity  . Alcohol use: No  .  Drug use: No  . Sexual activity: Not Currently  Lifestyle  . Physical activity:    Days per week: 0 days    Minutes per session: 0 min  . Stress: Not at all  Relationships  . Social connections:    Talks on phone: More than three times a week    Gets together: More than three times a week    Attends religious service: More than 4 times per year    Active member of club or organization: Not on file    Attends meetings of clubs or organizations: More than 4 times per year    Relationship status: Widowed  Other Topics Concern  . Not on file  Social History Narrative   Has 2 biological children and 1 step child   Allergies  Allergen Reactions  . Prednisone Anaphylaxis    Patient unable to remember why she needed to steroid shot but just knows that when she got back to work she started having a lot of trouble breathing.  (jkl 05/04/14)  . Amoxicillin-Pot Clavulanate Hives and Diarrhea    Has patient had a  PCN reaction causing immediate rash, facial/tongue/throat swelling, SOB or lightheadedness with hypotension: Unknown Has patient had a PCN reaction causing severe rash involving mucus membranes or skin necrosis: Unknown Has patient had a PCN reaction that required hospitalization: Unknown Has patient had a PCN reaction occurring within the last 10 years: Unknown If all of the above answers are "NO", then may proceed with Cephalosporin use.   . Aspirin Other (See Comments)    Stomach ache and bleed  . Erythromycin Diarrhea  . Levofloxacin Other (See Comments)    Headaches, GI upset  . Nsaids Other (See Comments)    stomach bleeding per pt  . Statins Nausea Only and Other (See Comments)    Headache, upset stomach, joint hurt, heartburn  . Sumatriptan Hives and Palpitations  . Adhesive [Tape] Other (See Comments)    blisters  . Ivp Dye [Iodinated Diagnostic Agents]     If made with blue shellfish then CAN NOT have this type of dye.  Pt was given contrast 10/12/17, not premedicated, had NO COMPLICATIONS- BLO, CT  . Methylphenidate Hcl     Unknown reaction  . Mirtazapine     Unknown reaction  . Shellfish Allergy     Blue shellfish  . Soy Allergy     Unknown reaction  . Doxycycline Diarrhea  . Hydrocodone Itching  . Hydrocodone-Acetaminophen Itching  . Latex Other (See Comments)    blisters  . Rofecoxib Nausea Only  . Sertraline Hcl Nausea Only  . Sulfonamide Derivatives Itching   Family History  Problem Relation Age of Onset  . Diabetes Mother   . Heart disease Father   . Diabetes Sister   . Breast cancer Maternal Grandmother   . Heart disease Maternal Uncle        x 2  . Diabetes Other        multi relatives on father side  . Heart disease Maternal Aunt   . Kidney disease Paternal Uncle        questionable  . Colon cancer Neg Hx   . Colon polyps Neg Hx   . Rectal cancer Neg Hx   . Stomach cancer Neg Hx     Current Outpatient Medications (Endocrine & Metabolic):  .   estradiol (ESTRACE) 1 MG tablet, Take 0.5 tablets (0.5 mg total) by mouth daily. --- Office visit needed for further refills .  levothyroxine (SYNTHROID, LEVOTHROID) 75 MCG tablet, TAKE 1 TABLET BY MOUTH EVERY DAY IN THE MORNING  Current Outpatient Medications (Cardiovascular):  .  triamterene-hydrochlorothiazide (MAXZIDE-25) 37.5-25 MG tablet, Take 1 tablet by mouth daily.  Current Outpatient Medications (Respiratory):  .  benzonatate (TESSALON) 200 MG capsule, TAKE 1 CAPSULE(200 MG) BY MOUTH THREE TIMES DAILY AS NEEDED FOR COUGH .  cetirizine (ZYRTEC) 10 MG tablet, TAKE 1 TABLET (10 MG TOTAL) BY MOUTH DAILY.  Current Outpatient Medications (Analgesics):  .  acetaminophen (TYLENOL) 500 MG tablet, Take 1,000 mg by mouth every 6 (six) hours as needed for mild pain, moderate pain, fever or headache.   Current Outpatient Medications (Other):  Marland Kitchen  5-Hydroxytryptophan (5-HTP PO), Take 1 capsule by mouth 2 (two) times daily. Marland Kitchen  ALPRAZolam (XANAX) 0.5 MG tablet, TAKE 1 TABLET(0.5 MG) BY MOUTH THREE TIMES DAILY AS NEEDED FOR ANXIETY .  AMBULATORY NON FORMULARY MEDICATION, [facility] .  Amino Acids (AMINO ACID PO), Take 1 capsule by mouth 2 (two) times daily. .  Ascorbic Acid (VITAMIN C) 1000 MG tablet, Take 1,000 mg by mouth 2 (two) times daily. .  B Complex-Biotin-FA (B COMPLETE) TABS, Take 1 tablet by mouth daily. B Complete 100mg  .  buPROPion (WELLBUTRIN XL) 150 MG 24 hr tablet, TAKE 1 TABLET(150 MG) BY MOUTH EVERY MORNING .  Cholecalciferol (VITAMIN D3) 1000 units CAPS, Take 1,000 Units by mouth daily. .  Coenzyme Q10 (CO Q-10) 200 MG CAPS, Take 1 capsule by mouth 2 (two) times daily. Marland Kitchen  ECHINACEA PO, Take 1 tablet by mouth 2 (two) times daily. Marland Kitchen  LECITHIN PO, Take 1 tablet by mouth daily. Marland Kitchen  MAGNESIUM ASPARTATE PO, Take 1 tablet by mouth 2 (two) times daily. .  meclizine (ANTIVERT) 25 MG tablet, Take 1 tablet (25 mg total) by mouth 3 (three) times daily as needed for dizziness. .  Misc. Devices  (ROLLATOR) MISC, Use rollator for ambulation .  Multiple Vitamin (MULTIVITAMIN WITH MINERALS) TABS tablet, Take 1 tablet by mouth daily. .  Omega-3 Fatty Acids (SALMON OIL-1000 PO), Take 1,000 mg by mouth daily. .  ondansetron (ZOFRAN) 4 MG tablet, Take 1 tablet (4 mg total) by mouth every 8 (eight) hours as needed for nausea or vomiting. .  ondansetron (ZOFRAN-ODT) 8 MG disintegrating tablet, Take 1 tablet (8 mg total) by mouth every 8 (eight) hours as needed for nausea or vomiting. Marland Kitchen  POTASSIUM AMINOBENZOATE PO, Take 1 tablet by mouth daily. .  ranitidine (ZANTAC) 150 MG tablet, Take 150 mg by mouth daily. .  TURMERIC PO, Take 1 tablet by mouth 2 (two) times daily.  .  vitamin E 400 UNIT capsule, Take 400 Units by mouth daily.    Past medical history, social, surgical and family history all reviewed in electronic medical record.  No pertanent information unless stated regarding to the chief complaint.   Review of Systems:  No  visual changes, nausea, vomiting, diarrhea, constipation, dizziness, abdominal pain, skin rash, fevers, chills, night sweats, weight loss, swollen lymph nodes, chest pain, shortness of breath, mood changes.  Positive muscle aches, body aches, headache  Objective  Blood pressure 114/70, pulse 82, height 5' (1.524 m), weight 134 lb (60.8 kg), SpO2 97 %.    General: No apparent distress alert and oriented x3 mood and affect normal, dressed appropriately.  HEENT: Pupils equal, extraocular movements intact  Respiratory: Patient's speak in full sentences and does not appear short of breath  Cardiovascular: Trace lower extremity edema, non tender, no erythema  Skin: Warm  dry intact with no signs of infection or rash on extremities or on axial skeleton.  Abdomen: Soft nontender  Neuro: Cranial nerves II through XII are intact, neurovascularly intact in all extremities with 2+ DTRs and 2+ pulses.  Lymph: No lymphadenopathy of posterior or anterior cervical chain or axillae  bilaterally.  Gait antalgic MSK:  tender is out of proportion to the amount of palpation with limited range of motion symmetric strength and tone of shoulders, elbows, wrist, \, knee and ankles bilaterally.  Moderate arthritic changes of multiple joints Right hip exam shows severe tenderness over the right greater trochanteric area.  Much worse than previous exam.  Patient fairly noncompliant with the rest of physical exam.  Patient does have strength of getting out of the chair but when testing strength sitting patient seems to be noncompliant.  After verbal consent patient was prepped with alcohol swabs and with a 21-gauge 2 inch needle injected into the right greater trochanteric area with a total of 1 cc of 0.5% Marcaine and 1 cc of Kenalog 40 mg/mL no blood loss.  Postinjection instructions given    Impression and Recommendations:     This case required medical decision making of moderate complexity. The above documentation has been reviewed and is accurate and complete Lyndal Pulley, DO       Note: This dictation was prepared with Dragon dictation along with smaller phrase technology. Any transcriptional errors that result from this process are unintentional.

## 2018-03-03 ENCOUNTER — Encounter: Payer: Self-pay | Admitting: Family Medicine

## 2018-03-03 ENCOUNTER — Ambulatory Visit: Payer: Medicare Other | Admitting: Family Medicine

## 2018-03-03 VITALS — BP 114/70 | HR 82 | Ht 60.0 in | Wt 134.0 lb

## 2018-03-03 DIAGNOSIS — M47816 Spondylosis without myelopathy or radiculopathy, lumbar region: Secondary | ICD-10-CM | POA: Diagnosis not present

## 2018-03-03 DIAGNOSIS — M7061 Trochanteric bursitis, right hip: Secondary | ICD-10-CM

## 2018-03-03 DIAGNOSIS — M7062 Trochanteric bursitis, left hip: Secondary | ICD-10-CM

## 2018-03-03 DIAGNOSIS — M549 Dorsalgia, unspecified: Secondary | ICD-10-CM | POA: Diagnosis not present

## 2018-03-03 DIAGNOSIS — G8929 Other chronic pain: Secondary | ICD-10-CM | POA: Diagnosis not present

## 2018-03-03 DIAGNOSIS — M797 Fibromyalgia: Secondary | ICD-10-CM

## 2018-03-03 NOTE — Patient Instructions (Addendum)
Good to see you  PT will call you  I tried an injection on the side of the hip  Calcium pyruvate 1500 mg daily  I am sorry you are hurting so much  I think pain medicine would be the next stop  Can see me again in 10 weeks

## 2018-03-04 ENCOUNTER — Encounter: Payer: Self-pay | Admitting: Family Medicine

## 2018-03-04 NOTE — Assessment & Plan Note (Signed)
Still believe some of the pain is from her back.  Patient was somewhat noncompliant on the physical exam today.  We discussed the possibility of referral to neurosurgery which patient declined.  Patient feels stronger medications could be beneficial but we did not feel that is appropriate at the moment.  And follow-up with me again in 4 to 6 weeks

## 2018-03-04 NOTE — Assessment & Plan Note (Signed)
Patient is having a flare overall.  Discussed icing regimen and home exercises.  Discussed which activities of doing which wants to avoid.  Discussed other over-the-counter medications that could be beneficial but also stated the patient is taking numerous medications and over-the-counter medications and probably needs to decrease.  Patient will try to make some changes.  Follow-up again in 4 weeks

## 2018-03-04 NOTE — Assessment & Plan Note (Signed)
Right-sided injected today.  Discussed icing regimen.  Discussed topical anti-inflammatories.  Pain does not seem to be overly controlled at the moment.  May need referral to pain management.

## 2018-03-11 DIAGNOSIS — M545 Low back pain: Secondary | ICD-10-CM | POA: Diagnosis not present

## 2018-03-11 DIAGNOSIS — M79604 Pain in right leg: Secondary | ICD-10-CM | POA: Diagnosis not present

## 2018-03-11 DIAGNOSIS — G8929 Other chronic pain: Secondary | ICD-10-CM | POA: Diagnosis not present

## 2018-03-14 NOTE — Patient Instructions (Addendum)
  Tests ordered today. Your results will be released to [facility] (or called to you) after review, usually within 72hours after test completion. If any changes need to be made, you will be notified at that same time.  Flu immunization administered today.    Medications reviewed and updated.  Changes include :   Start taking the estrace every other day.    A referral was ordered for pain management.  Dr Tamala Julian may refer you to another pain clinic.   A bone density scan was ordered.    Please followup in 6 months

## 2018-03-14 NOTE — Progress Notes (Signed)
Subjective:    Patient ID: Laura Barnes, female    DOB: 02/23/1941, 77 y.o.   MRN: [id number]  HPI The patient is here for follow up.  Anxiety: She is taking her medication daily as prescribed. She denies any side effects from the medication. She feels her anxiety is controlled overall, but occasionally has an anxiety attack.  She has had anxiety attacks her whole life.  She is happy with her current dose of medication.   Bilateral leg edema:  She is taking the water pill daily. She has intermittent edema, but overall her swelling is controlled.   Fibromyalgia:  She is taking CBD oil and uses patches.  She is in severe pain.  She has pain in her right lateral leg that is severe.  She does see Dr Tamala Julian and just started PT.  She is taking tylenol throughout the day and sometimes takes it with xanax which helps it work quicker.  She needs something stronger for the pain.  Hypothyroidism:  She is taking her medication daily.  She denies any recent changes in energy or weight that are unexplained.   Chronic nausea:  She taking antinausea medication as needed only.  Her pain often causes nausea  GERD:  She is taking her medication daily as prescribed.  She denies any GERD symptoms and feels her GERD is well controlled.   Depression: She is taking her medication daily as prescribed. She denies any side effects from the medication. She feels her depression is well controlled and she is happy with her current dose of medication.   HRT:  She is taking estrace daily.  We decreased her dose to 0.5 mg six months ago.  She has occasional hot flash.      Medications and allergies reviewed with patient and updated if appropriate.  Patient Active Problem List   Diagnosis Date Noted  . Difficulty urinating 11/24/2017  . Dizziness 08/11/2017  . Sacroiliac pain 06/17/2017  . Greater trochanteric bursitis of right hip 05/27/2017  . Vertigo 04/29/2017  . Dysphagia 10/28/2016  . Lump of skin  05/11/2016  . 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 Chronic meniscal tear of knee 03/05/2016  . Nausea 03/04/2016  . Lightheadedness 12/18/2015  . Osteopenia 12/03/2015  . Thyroid nodule 11/16/2015  . Bilateral leg edema 11/05/2015  . Anterior neck pain 11/05/2015  . Hormone replacement therapy (HRT) 11/05/2015  . Subacromial bursitis 09/16/2015  . 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 Trochanteric bursitis of both hips 03/12/2015  . Subacromial impingement of left shoulder 03/12/2015  . Hyperlipidemia 05/01/2014  . Chronic pain syndrome 05/01/2014  . Abnormal breast finding 05/01/2014  . Lumbar radiculopathy 03/27/2014  . Left shoulder pain 11/07/2013  . Degeneration of intervertebral disc of cervical region 10/10/2013  . Fibromyalgia 09/06/2013  . Bilateral shoulder pain 09/06/2013  . Low back pain 08/12/2013  . Chronic cough 04/12/2013  . Sinusitis, chronic 04/12/2013  . Hypothyroidism 04/07/2012  . Chronic venous insufficiency 04/02/2010  . Enthesopathy of hip region 07/19/2007  . Osteoporosis 07/19/2007  . Anxiety 01/23/2007  . Depression 01/23/2007  . Migraine 01/23/2007  . NINAR (noninfectious nonallergic rhinitis) 01/23/2007  . Gastroesophageal reflux disease 01/23/2007  . IBS 01/23/2007    Current Outpatient Medications on File Prior to Visit  Medication Sig Dispense Refill  . 5-Hydroxytryptophan (5-HTP PO) Take 1 capsule by mouth 2 (two) times daily.    Marland Kitchen acetaminophen (TYLENOL) 500 MG tablet Take 1,000 mg by mouth every  6 (six) hours as needed for mild pain, moderate pain, fever or headache.    . ALPRAZolam (XANAX) 0.5 MG tablet TAKE 1 TABLET(0.5 MG) BY MOUTH THREE TIMES DAILY AS NEEDED FOR ANXIETY 90 tablet 2  . AMBULATORY NON FORMULARY MEDICATION Walker 1 Device 0  . Amino Acids (AMINO ACID PO) Take 1 capsule by mouth 2 (two) times daily.    . Ascorbic Acid (VITAMIN C) 1000 MG tablet Take 1,000 mg by mouth 2 (two) times daily.     . B Complex-Biotin-FA (B COMPLETE) TABS Take 1 tablet by mouth daily. B Complete 100mg     . benzonatate (TESSALON) 200 MG capsule TAKE 1 CAPSULE(200 MG) BY MOUTH THREE TIMES DAILY AS NEEDED FOR COUGH 90 capsule 6  . buPROPion (WELLBUTRIN XL) 150 MG 24 hr tablet TAKE 1 TABLET(150 MG) BY MOUTH EVERY MORNING 90 tablet 1  . cetirizine (ZYRTEC) 10 MG tablet TAKE 1 TABLET (10 MG TOTAL) BY MOUTH DAILY. 30 tablet 11  . Cholecalciferol (VITAMIN D3) 1000 units CAPS Take 1,000 Units by mouth daily.    . Coenzyme Q10 (CO Q-10) 200 MG CAPS Take 1 capsule by mouth 2 (two) times daily.    Marland Kitchen ECHINACEA PO Take 1 tablet by mouth 2 (two) times daily.    Marland Kitchen estradiol (ESTRACE) 1 MG tablet Take 0.5 tablets (0.5 mg total) by mouth daily. --- Office visit needed for further refills 45 tablet 1  . LECITHIN PO Take 1 tablet by mouth daily.    Marland Kitchen levothyroxine (SYNTHROID, LEVOTHROID) 75 MCG tablet TAKE 1 TABLET BY MOUTH EVERY DAY IN THE MORNING 90 tablet 1  . MAGNESIUM ASPARTATE PO Take 1 tablet by mouth 2 (two) times daily.    . meclizine (ANTIVERT) 25 MG tablet Take 1 tablet (25 mg total) by mouth 3 (three) times daily as needed for dizziness. 30 tablet 5  . Misc. Devices (ROLLATOR) MISC Use rollator for ambulation 1 each 0  . Multiple Vitamin (MULTIVITAMIN WITH MINERALS) TABS tablet Take 1 tablet by mouth daily.    . Omega-3 Fatty Acids (SALMON OIL-1000 PO) Take 1,000 mg by mouth daily.    . ondansetron (ZOFRAN) 4 MG tablet Take 1 tablet (4 mg total) by mouth every 8 (eight) hours as needed for nausea or vomiting. 30 tablet 1  . ondansetron (ZOFRAN-ODT) 8 MG disintegrating tablet Take 1 tablet (8 mg total) by mouth every 8 (eight) hours as needed for nausea or vomiting. 30 tablet 2  . POTASSIUM AMINOBENZOATE PO Take 1 tablet by mouth daily.    Marland Kitchen triamterene-hydrochlorothiazide (MAXZIDE-25) 37.5-25 MG tablet Take 1 tablet by mouth daily. 90 tablet 3  . TURMERIC PO Take 1 tablet by mouth 2 (two) times daily.     .  vitamin E 400 UNIT capsule Take 400 Units by mouth daily.     No current facility-administered medications on file prior to visit.     Past Medical History:  Diagnosis Date  . ALLERGIC RHINITIS   . Allergy   . Anemia   . ANXIETY   . Arthritis    hands  . Bronchitis   . Chronic fatigue fibromyalgia syndrome   . Complication of anesthesia    says one time waking up she couldn't breathe, felt like her throat closing up  . DEPRESSION   . Enthesopathy of hip region   . Family history of anesthesia complication    sister with n/v  . FOOT PAIN, BILATERAL   . GERD   . Hiatal hernia   .  HYPERLIPIDEMIA   . Hypothyroidism   . IBS   . MIGRAINE HEADACHE   . MIGRAINE, COMMON   . NECK MASS   . OSTEOPENIA   . OSTEOPOROSIS   . PLANTAR FASCIITIS   . RASH-NONVESICULAR   . SINUSITIS- ACUTE-NOS   . SKIN LESION   . VAGINITIS   . VENOUS INSUFFICIENCY, CHRONIC     Past Surgical History:  Procedure Laterality Date  . APPENDECTOMY    . BREAST ENHANCEMENT SURGERY    . BREAST IMPLANT REMOVAL    . CHOLECYSTECTOMY    . COLONOSCOPY    . FOOT SURGERY  11/06/2016  . MASTECTOMY     bilateral for severe bilateral fibrocystic disease  . OOPHORECTOMY    . s/p neck lump removal    . SHOULDER SURGERY    . SINUS ENDO W/FUSION Right 06/30/2013   Procedure: RIGHT ENDOSCOPIC SPHENOIDECTOMY WITH FUSION SCAN;  Surgeon: [name], MD;  Location: [facility];  Service: ENT;  Laterality: Right;  . TONSILLECTOMY    . VAGINAL HYSTERECTOMY      Social History   Socioeconomic History  . Marital status: Widowed    Spouse name: Not on file  . Number of children: 2  . Years of education: Not on file  . Highest education level: Not on file  Occupational History  . Occupation: retired Water quality scientist  . Financial resource strain: Not hard at all  . Food insecurity:    Worry: Never true    Inability: Never true  . Transportation needs:    Medical: No    Non-medical: No  Tobacco Use  .  Smoking status: Never Smoker  . Smokeless tobacco: Never Used  Substance and Sexual Activity  . Alcohol use: No  . Drug use: No  . Sexual activity: Not Currently  Lifestyle  . Physical activity:    Days per week: 0 days    Minutes per session: 0 min  . Stress: Not at all  Relationships  . Social connections:    Talks on phone: More than three times a week    Gets together: More than three times a week    Attends religious service: More than 4 times per year    Active member of club or organization: Not on file    Attends meetings of clubs or organizations: More than 4 times per year    Relationship status: Widowed  Other Topics Concern  . Not on file  Social History Narrative   Has 2 biological children and 1 step child    Family History  Problem Relation Age of Onset  . Diabetes Mother   . Heart disease Father   . Diabetes Sister   . Breast cancer Maternal Grandmother   . Heart disease Maternal Uncle        x 2  . Diabetes Other        multi relatives on father side  . Heart disease Maternal Aunt   . Kidney disease Paternal Uncle        questionable  . Colon cancer Neg Hx   . Colon polyps Neg Hx   . Rectal cancer Neg Hx   . Stomach cancer Neg Hx     Review of Systems  Constitutional: Negative for chills and fever.  Respiratory: Positive for cough and shortness of breath (occ). Negative for wheezing.   Cardiovascular: Positive for leg swelling (chronic, intermittent). Negative for chest pain and palpitations.  Gastrointestinal: Positive for nausea (occasional). Negative for  abdominal pain.  Neurological: Positive for headaches (mild).       Objective:   Vitals:   03/15/18 1325  BP: (!) 142/94  Pulse: 88  Resp: 16  Temp: 98.3 F (36.8 C)  SpO2: 97%   BP Readings from Last 3 Encounters:  03/15/18 (!) 142/94  03/03/18 114/70  02/08/18 (!) 148/70   Wt Readings from Last 3 Encounters:  03/15/18 133 lb (60.3 kg)  03/03/18 134 lb (60.8 kg)  01/12/18  135 lb (61.2 kg)   Body mass index is 25.97 kg/m.   Physical Exam    Constitutional: Appears well-developed and well-nourished. No distress.  HENT:  Head: Normocephalic and atraumatic.  Neck: Neck supple. No tracheal deviation present. No thyromegaly present.  No cervical lymphadenopathy Cardiovascular: Normal rate, regular rhythm and normal heart sounds.   No murmur heard. No carotid bruit .  No edema Pulmonary/Chest: Effort normal and breath sounds normal. No respiratory distress. No has no wheezes. No rales.  Skin: Skin is warm and dry. Not diaphoretic.  Psychiatric: Normal mood and affect. Behavior is normal.      Assessment & Plan:    See Problem List for Assessment and Plan of chronic medical problems.

## 2018-03-15 ENCOUNTER — Ambulatory Visit (INDEPENDENT_AMBULATORY_CARE_PROVIDER_SITE_OTHER)
Admission: RE | Admit: 2018-03-15 | Discharge: 2018-03-15 | Disposition: A | Discharge status: Home / Self Care | Payer: Medicare Other | Source: Ambulatory Visit | Attending: Internal Medicine | Admitting: Internal Medicine

## 2018-03-15 ENCOUNTER — Encounter: Payer: Self-pay | Admitting: Internal Medicine

## 2018-03-15 ENCOUNTER — Ambulatory Visit (INDEPENDENT_AMBULATORY_CARE_PROVIDER_SITE_OTHER): Payer: Medicare Other | Admitting: Internal Medicine

## 2018-03-15 ENCOUNTER — Other Ambulatory Visit (INDEPENDENT_AMBULATORY_CARE_PROVIDER_SITE_OTHER): Payer: Medicare Other

## 2018-03-15 VITALS — BP 142/94 | HR 88 | Temp 98.3°F | Resp 16 | Ht 60.0 in | Wt 133.0 lb

## 2018-03-15 DIAGNOSIS — R350 Frequency of micturition: Secondary | ICD-10-CM

## 2018-03-15 DIAGNOSIS — M797 Fibromyalgia: Secondary | ICD-10-CM | POA: Diagnosis not present

## 2018-03-15 DIAGNOSIS — R600 Localized edema: Secondary | ICD-10-CM

## 2018-03-15 DIAGNOSIS — E039 Hypothyroidism, unspecified: Secondary | ICD-10-CM

## 2018-03-15 DIAGNOSIS — R110 Nausea: Secondary | ICD-10-CM

## 2018-03-15 DIAGNOSIS — M85852 Other specified disorders of bone density and structure, left thigh: Secondary | ICD-10-CM | POA: Diagnosis not present

## 2018-03-15 DIAGNOSIS — F32A Depression, unspecified: Secondary | ICD-10-CM

## 2018-03-15 DIAGNOSIS — F329 Major depressive disorder, single episode, unspecified: Secondary | ICD-10-CM

## 2018-03-15 DIAGNOSIS — M85851 Other specified disorders of bone density and structure, right thigh: Secondary | ICD-10-CM

## 2018-03-15 DIAGNOSIS — Z79890 Hormone replacement therapy (postmenopausal): Secondary | ICD-10-CM

## 2018-03-15 DIAGNOSIS — F419 Anxiety disorder, unspecified: Secondary | ICD-10-CM

## 2018-03-15 DIAGNOSIS — K219 Gastro-esophageal reflux disease without esophagitis: Secondary | ICD-10-CM

## 2018-03-15 LAB — COMPREHENSIVE METABOLIC PANEL
ALT: 16 U/L (ref 0–35)
AST: 18 U/L (ref 0–37)
Albumin: 4.5 g/dL (ref 3.5–5.2)
Alkaline Phosphatase: 43 U/L (ref 39–117)
BUN: 12 mg/dL (ref 6–23)
CO2: 28 mEq/L (ref 19–32)
Calcium: 10.2 mg/dL (ref 8.4–10.5)
Chloride: 94 mEq/L — ABNORMAL LOW (ref 96–112)
Creatinine, Ser: 0.74 mg/dL (ref 0.40–1.20)
GFR: 80.84 mL/min (ref >60.00)
Glucose, Bld: 106 mg/dL — ABNORMAL HIGH (ref 70–99)
Potassium: 3.6 mEq/L (ref 3.5–5.1)
Sodium: 128 mEq/L — ABNORMAL LOW (ref 135–145)
Total Bilirubin: 0.3 mg/dL (ref 0.2–1.2)
Total Protein: 8 g/dL (ref 6.0–8.3)

## 2018-03-15 LAB — URINALYSIS, ROUTINE W REFLEX MICROSCOPIC
Bilirubin Urine: NEGATIVE
Hgb urine dipstick: NEGATIVE
Ketones, ur: NEGATIVE
Leukocytes, UA: NEGATIVE
Nitrite: NEGATIVE
Specific Gravity, Urine: 1.01 (ref 1.000–1.030)
Total Protein, Urine: NEGATIVE
Urine Glucose: NEGATIVE
Urobilinogen, UA: 0.2 (ref 0.0–1.0)
pH: 7 (ref 5.0–8.0)

## 2018-03-15 LAB — CBC WITH DIFFERENTIAL/PLATELET
Basophils Absolute: 0 10*3/uL (ref 0.0–0.1)
Basophils Relative: 0.4 % (ref 0.0–3.0)
Eosinophils Absolute: 0.1 10*3/uL (ref 0.0–0.7)
Eosinophils Relative: 1.6 % (ref 0.0–5.0)
HCT: 39.3 % (ref 36.0–46.0)
Hemoglobin: 13.6 g/dL (ref 12.0–15.0)
Lymphocytes Relative: 27.7 % (ref 12.0–46.0)
Lymphs Abs: 2.4 10*3/uL (ref 0.7–4.0)
MCHC: 34.5 g/dL (ref 30.0–36.0)
MCV: 96.2 fl (ref 78.0–100.0)
Monocytes Absolute: 1 10*3/uL (ref 0.1–1.0)
Monocytes Relative: 11.8 % (ref 3.0–12.0)
Neutro Abs: 5.1 10*3/uL (ref 1.4–7.7)
Neutrophils Relative %: 58.5 % (ref 43.0–77.0)
Platelets: 303 10*3/uL (ref 150.0–400.0)
RBC: 4.08 Mil/uL (ref 3.87–5.11)
RDW: 14.1 % (ref 11.5–15.5)
WBC: 8.7 10*3/uL (ref 4.0–10.5)

## 2018-03-15 LAB — TSH: TSH: 1.56 u[IU]/mL (ref 0.35–4.50)

## 2018-03-15 MED ORDER — ESTRADIOL 0.5 MG PO TABS: 0.5000 mg | ORAL_TABLET | ORAL | Status: DC

## 2018-03-15 NOTE — Assessment & Plan Note (Signed)
Chronic, intermittent nausea-some of its related to her pain level Continue nausea medication as needed

## 2018-03-15 NOTE — Assessment & Plan Note (Signed)
Stable, controlled with daily diuretic CMP

## 2018-03-15 NOTE — Assessment & Plan Note (Signed)
GERD controlled Continue daily medication  

## 2018-03-15 NOTE — Assessment & Plan Note (Signed)
Controlled, stable.  She does have occasional anxiety attacks, but this has been lifelong Continue current dose of medication-Xanax 3 times daily and Wellbutrin daily

## 2018-03-15 NOTE — Assessment & Plan Note (Signed)
Chronically fatigued, clinically euthyroid Check tsh  Titrate med dose if needed

## 2018-03-15 NOTE — Assessment & Plan Note (Signed)
DEXA due-ordered Not exercising regularly Taking vitamin D daily

## 2018-03-15 NOTE — Assessment & Plan Note (Signed)
Controlled, stable Continue current dose of medication-Wellbutrin

## 2018-03-15 NOTE — Assessment & Plan Note (Signed)
At her last visit we decreased Estrace 1 mg daily to 0.5 mg daily-now decreased to every other day and eventually taper off Discussed risks of medication outweigh benefits and that she needs to come off medication

## 2018-03-15 NOTE — Assessment & Plan Note (Signed)
Recently saw Dr. Tamala Julian and he discussed ice and home exercises Has been referred to physical therapy and she has done one session Requesting Dilaudid or other narcotic pain medication, which I do not want to prescribe-discussed referral to pain management She is interested in seeing pain management-referred Taking several supplements, including CBD oil, patches Has follow-up with Dr. Tamala Julian next month

## 2018-03-17 ENCOUNTER — Encounter: Payer: Self-pay | Admitting: Internal Medicine

## 2018-03-17 LAB — CULTURE, URINE COMPREHENSIVE
MICRO NUMBER:: 91238428
RESULT:: NO GROWTH
SPECIMEN QUALITY:: ADEQUATE

## 2018-03-18 ENCOUNTER — Encounter: Payer: Self-pay | Admitting: Internal Medicine

## 2018-03-19 ENCOUNTER — Other Ambulatory Visit: Payer: Self-pay | Admitting: Internal Medicine

## 2018-03-21 ENCOUNTER — Encounter: Payer: Self-pay | Admitting: Internal Medicine

## 2018-03-22 ENCOUNTER — Encounter: Payer: Self-pay | Admitting: Family Medicine

## 2018-03-22 ENCOUNTER — Other Ambulatory Visit: Payer: Self-pay | Admitting: Internal Medicine

## 2018-03-22 DIAGNOSIS — F419 Anxiety disorder, unspecified: Secondary | ICD-10-CM

## 2018-03-22 DIAGNOSIS — G894 Chronic pain syndrome: Secondary | ICD-10-CM

## 2018-03-22 DIAGNOSIS — M79604 Pain in right leg: Secondary | ICD-10-CM | POA: Diagnosis not present

## 2018-03-22 DIAGNOSIS — M47816 Spondylosis without myelopathy or radiculopathy, lumbar region: Secondary | ICD-10-CM

## 2018-03-22 DIAGNOSIS — M545 Low back pain: Secondary | ICD-10-CM | POA: Diagnosis not present

## 2018-03-22 DIAGNOSIS — G8929 Other chronic pain: Secondary | ICD-10-CM | POA: Diagnosis not present

## 2018-03-23 NOTE — Telephone Encounter (Signed)
[place] Controlled Substance Database checked. Last filled on 02/27/18  Last OV 03/15/18

## 2018-03-28 ENCOUNTER — Encounter: Payer: Self-pay | Admitting: Family Medicine

## 2018-03-28 NOTE — Telephone Encounter (Signed)
Routing to sports med °

## 2018-03-28 NOTE — Telephone Encounter (Signed)
Pt states she would like a call back due to the pain in her legs. She states she has not been able to sleep due to her fibromyalgia pain. Please advise.

## 2018-03-29 DIAGNOSIS — M79604 Pain in right leg: Secondary | ICD-10-CM | POA: Diagnosis not present

## 2018-03-29 DIAGNOSIS — M545 Low back pain: Secondary | ICD-10-CM | POA: Diagnosis not present

## 2018-03-29 DIAGNOSIS — G8929 Other chronic pain: Secondary | ICD-10-CM | POA: Diagnosis not present

## 2018-04-01 ENCOUNTER — Other Ambulatory Visit: Payer: Self-pay | Admitting: Internal Medicine

## 2018-04-04 ENCOUNTER — Other Ambulatory Visit: Payer: Self-pay | Admitting: *Deleted

## 2018-04-04 DIAGNOSIS — M5440 Lumbago with sciatica, unspecified side: Principal | ICD-10-CM

## 2018-04-04 DIAGNOSIS — G8929 Other chronic pain: Secondary | ICD-10-CM

## 2018-04-11 ENCOUNTER — Other Ambulatory Visit: Payer: Self-pay | Admitting: Internal Medicine

## 2018-04-19 ENCOUNTER — Other Ambulatory Visit: Payer: Self-pay | Admitting: Internal Medicine

## 2018-04-19 ENCOUNTER — Ambulatory Visit (INDEPENDENT_AMBULATORY_CARE_PROVIDER_SITE_OTHER): Payer: Medicare Other | Admitting: Orthopaedic Surgery

## 2018-04-19 ENCOUNTER — Encounter (INDEPENDENT_AMBULATORY_CARE_PROVIDER_SITE_OTHER): Payer: Self-pay | Admitting: Orthopaedic Surgery

## 2018-04-19 VITALS — BP 156/91 | HR 95 | Ht 60.0 in | Wt 126.0 lb

## 2018-04-19 DIAGNOSIS — M5126 Other intervertebral disc displacement, lumbar region: Secondary | ICD-10-CM | POA: Diagnosis not present

## 2018-04-19 DIAGNOSIS — G8929 Other chronic pain: Secondary | ICD-10-CM

## 2018-04-19 DIAGNOSIS — M545 Low back pain, unspecified: Secondary | ICD-10-CM

## 2018-04-19 NOTE — Progress Notes (Signed)
Office Visit Note   Patient: Laura Barnes           Date of Birth: September 11, 1940           MRN: [id number] Visit Date: 04/19/2018              Requested by: Binnie Rail, MD [place], [place] [address] PCP: Binnie Rail, MD   Assessment & Plan: Visit Diagnoses:  1. Chronic bilateral low back pain, unspecified whether sciatica present   2. Protrusion of lumbar intervertebral disc             Right L2-3 with radiculopathy.  Plan: Patient requested referral for epidural.  I reviewed the MRI scan with her and discussed with her I do not think she has fibromyalgia pain but has a large disc protrusion causing significant central lateral recess and foraminal compression on the right at L2-3 with a migrated fragment causing her leg weakness pain and numbness.  This is been going on for years.  We will send her for an epidural she requested see her back after she is having persistent problems she needs a new MRI scan and I discussed with her surgery would likely give her significant relief of her pain.  She is hesitant to consider this due to her sister's experience.  Follow-up after epidural.  Gave her a copy of the MRI report from February 2019 reviewed previous MRI cervical spine, plain x-rays, old lumbar MRI.  She has cervical spondylosis but not significant enough to consider surgical intervention at this time.  Her principal problem is large disc extrusion causing the stenosis central lateral recess and foraminal on the right at L2-3.  Follow-Up Instructions: No follow-ups on file.   Orders:  Orders Placed This Encounter  Procedures  . Ambulatory referral to Physical Medicine Rehab   No orders of the defined types were placed in this encounter.     Procedures: No procedures performed   Clinical Data: No additional findings.   Subjective: Chief Complaint  Patient presents with  . Lower Back - Pain    HPI 77 year old female seen with history of chronic back  pain for years.  She has had epidurals in the past and previous epidural was effective.  She is been in pain management in the past has been on narcotics but states she does not want to take narcotics.  She is tried Tylenol, CBD oil, holistic natural supplements multiple types.  She states she has pain in the right buttocks right thigh with numbness and right quad weakness difficulty with stairs and has to avoid them.  No symptoms of the left lower extremity.  She admits she has some shoulder problems she states she was told she has fibromyalgia.  She has some stiffness in her neck.  She has a sister who had back surgery ended up having 5 procedures and patient states her sister is not any better.  Last epidural 02/08/2018 gave her some relief.  Cervical MRI 2015 showed spondylosis multilevel with mild spondylolisthesis without severe compression.  MRI scan 07/25/2017 lumbar spine showed large right lateral recess disc protrusion with significant compression spinal stenosis and lateral recess stenosis at L2-3 with fat foraminal disc protrusion facet arthropathy and degenerative retrolisthesis.  Disc extended 1.3 cm caudad.  This was a new finding compared to previous MRI a few years prior.  No associated bowel or bladder symptoms.  No myelopathic symptoms.  Review of Systems positive for history of shoulder  impingement and bursitis.  Lumbar cervical disc degeneration.  IBS, hypothyroidism hyperlipidemia.  Patient reported history of fibromyalgia.  Positive for depression.   Objective: Vital Signs: BP (!) 156/91   Pulse 95   Ht 5' (1.524 m)   Wt 126 lb (57.2 kg)   BMI 24.61 kg/m   Physical Exam  Constitutional: She is oriented to person, place, and time. She appears well-developed.  HENT:  Head: Normocephalic.  Right Ear: External ear normal.  Left Ear: External ear normal.  Eyes: Pupils are equal, round, and reactive to light.  Neck: No tracheal deviation present. No thyromegaly present.    Cardiovascular: Normal rate.  Pulmonary/Chest: Effort normal.  Abdominal: Soft.  Neurological: She is alert and oriented to person, place, and time.  Skin: Skin is warm and dry.  Psychiatric: She has a normal mood and affect. Her behavior is normal.    Ortho Exam patient has positive reverse straight leg raising on the right quad weakness on the right some slight right abductor weakness.  Trochanteric bursal tenderness mild sciatic notch tenderness.  Anterior tib gastrocsoleus is strong.  Distal pulses are intact.  Mild shoulder impingement right and left.  Some brachial plexus tenderness right and left negative Spurling.  Specialty Comments:  No specialty comments available.  Imaging: CLINICAL DATA:  Low back pain radiating to the right buttock and right leg intermittently for the last 7 years. Fibromyalgia.  EXAM: MRI LUMBAR SPINE WITHOUT CONTRAST  TECHNIQUE: Multiplanar, multisequence MR imaging of the lumbar spine was performed. No intravenous contrast was administered.  COMPARISON:  Multiple exams, including 04/14/2010  FINDINGS: Segmentation: The lowest lumbar type non-rib-bearing vertebra is labeled as L5.  Alignment: Levoconvex lumbar scoliosis centered at the L3 level. The mild rotary component. 5 mm of degenerative retrolisthesis at L2-3 with 4 mm degenerative anterolisthesis at L3-4 and 3 mm degenerative anterolisthesis at L4-5.  Vertebrae: Type 3 degenerative endplate findings at J1-9 and type 2 degenerative endplate findings at J4-7 and L5-S1 with considerable loss of intervertebral disc height at all 3 levels. Facet and perifacet edema on the left at L5-S1.  Conus medullaris and cauda equina: Conus extends to the L1 level. Conus and cauda equina appear normal.  Paraspinal and other soft tissues: A fluid signal intensity lesion of the right kidney is partially included on today's exam and statistically likely to represent a cyst although  technically nonspecific.  Disc levels:  T11-12: No impingement.  Small right perineural cyst.  T12-L1: No impingement.  Bilateral perineural cysts.  L1-2: No impingement.  Mild disc bulge.  Bilateral perineural cysts.  L2-3: Prominent right foraminal stenosis with prominent right subarticular lateral recess stenosis and moderate right eccentric central narrowing of the thecal sac, with moderate right and mild left displacement of the L2 nerves in the lateral extraforaminal space, due to a large right lateral recess and foraminal disc protrusion, facet arthropathy, intervertebral spurring, and the degenerative retrolisthesis at this level. The disc protrusion extends caudad about 1.3 cm in the right lateral recess, and this disc protrusion is shown on image 9/6.  L3-4: Moderate right and mild left foraminal stenosis with borderline right subarticular lateral recess stenosis due to disc uncovering, facet spurring, and intervertebral spurring  L4-5: Mild bilateral subarticular lateral recess stenosis due to disc bulge, facet arthropathy, and ligamentum flavum redundancy. Small bilateral facet joint effusions.  L5-S1: Borderline bilateral subarticular lateral recess stenosis due to disc osteophyte complex. Small left paracentral disc protrusion.  IMPRESSION: 1. Lumbar spondylosis, scoliosis, and degenerative disc  disease with multilevel new subluxations the compared to the prior lumbar MRI. The dominant finding is a large right lateral recess and foraminal disc protrusion at the L2-3 level, extending caudad. These factors result in prominent impingement at L2-3; moderate impingement at L3-4; and mild impingement at L4-5, as detailed above.   Electronically Signed   By: Van Clines M.D.   On: 07/25/2017 17:39   PMFS History: Patient Active Problem List   Diagnosis Date Noted  . Difficulty urinating 11/24/2017  . Dizziness 08/11/2017  . Sacroiliac pain  06/17/2017  . Greater trochanteric bursitis of right hip 05/27/2017  . Vertigo 04/29/2017  . Dysphagia 10/28/2016  . Lump of skin 05/11/2016  . 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 Chronic meniscal tear of knee 03/05/2016  . Nausea 03/04/2016  . Lightheadedness 12/18/2015  . Osteopenia 12/03/2015  . Thyroid nodule 11/16/2015  . Bilateral leg edema 11/05/2015  . Anterior neck pain 11/05/2015  . Hormone replacement therapy (HRT) 11/05/2015  . Subacromial bursitis 09/16/2015  . 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 Trochanteric bursitis of both hips 03/12/2015  . Subacromial impingement of left shoulder 03/12/2015  . Hyperlipidemia 05/01/2014  . Chronic pain syndrome 05/01/2014  . Lumbar radiculopathy 03/27/2014  . Left shoulder pain 11/07/2013  . Degeneration of intervertebral disc of cervical region 10/10/2013  . Fibromyalgia 09/06/2013  . Bilateral shoulder pain 09/06/2013  . Low back pain 08/12/2013  . Chronic cough 04/12/2013  . Sinusitis, chronic 04/12/2013  . Hypothyroidism 04/07/2012  . Chronic venous insufficiency 04/02/2010  . Enthesopathy of hip region 07/19/2007  . Osteoporosis 07/19/2007  . Anxiety 01/23/2007  . Depression 01/23/2007  . Migraine 01/23/2007  . NINAR (noninfectious nonallergic rhinitis) 01/23/2007  . Gastroesophageal reflux disease 01/23/2007  . IBS 01/23/2007   Past Medical History:  Diagnosis Date  . ALLERGIC RHINITIS   . Allergy   . Anemia   . ANXIETY   . Arthritis    hands  . Bronchitis   . Chronic fatigue fibromyalgia syndrome   . Complication of anesthesia    says one time waking up she couldn't breathe, felt like her throat closing up  . DEPRESSION   . Enthesopathy of hip region   . Family history of anesthesia complication    sister with n/v  . FOOT PAIN, BILATERAL   . GERD   . Hiatal hernia   . HYPERLIPIDEMIA   . Hypothyroidism   . IBS   . MIGRAINE HEADACHE   . MIGRAINE, COMMON   . NECK  MASS   . OSTEOPENIA   . OSTEOPOROSIS   . PLANTAR FASCIITIS   . RASH-NONVESICULAR   . SINUSITIS- ACUTE-NOS   . SKIN LESION   . VAGINITIS   . VENOUS INSUFFICIENCY, CHRONIC     Family History  Problem Relation Age of Onset  . Diabetes Mother   . Heart disease Father   . Diabetes Sister   . Breast cancer Maternal Grandmother   . Heart disease Maternal Uncle        x 2  . Diabetes Other        multi relatives on father side  . Heart disease Maternal Aunt   . Kidney disease Paternal Uncle        questionable  . Colon cancer Neg Hx   . Colon polyps Neg Hx   . Rectal cancer Neg Hx   . Stomach cancer Neg Hx     Past Surgical History:  Procedure Laterality Date  .  APPENDECTOMY    . BREAST ENHANCEMENT SURGERY    . BREAST IMPLANT REMOVAL    . CHOLECYSTECTOMY    . COLONOSCOPY    . FOOT SURGERY  11/06/2016  . MASTECTOMY     bilateral for severe bilateral fibrocystic disease  . OOPHORECTOMY    . s/p neck lump removal    . SHOULDER SURGERY    . SINUS ENDO W/FUSION Right 06/30/2013   Procedure: RIGHT ENDOSCOPIC SPHENOIDECTOMY WITH FUSION SCAN;  Surgeon: [name], MD;  Location: [facility];  Service: ENT;  Laterality: Right;  . TONSILLECTOMY    . VAGINAL HYSTERECTOMY     Social History   Occupational History  . Occupation: retired Press photographer  Tobacco Use  . Smoking status: Never Smoker  . Smokeless tobacco: Never Used  Substance and Sexual Activity  . Alcohol use: No  . Drug use: No  . Sexual activity: Not Currently

## 2018-04-20 ENCOUNTER — Encounter (INDEPENDENT_AMBULATORY_CARE_PROVIDER_SITE_OTHER): Payer: Self-pay | Admitting: Orthopaedic Surgery

## 2018-04-20 DIAGNOSIS — M5126 Other intervertebral disc displacement, lumbar region: Secondary | ICD-10-CM | POA: Insufficient documentation

## 2018-04-21 ENCOUNTER — Encounter: Payer: Self-pay | Admitting: Family Medicine

## 2018-04-26 ENCOUNTER — Other Ambulatory Visit: Payer: Self-pay | Admitting: Internal Medicine

## 2018-05-03 ENCOUNTER — Other Ambulatory Visit: Payer: Self-pay | Admitting: Internal Medicine

## 2018-05-03 DIAGNOSIS — F419 Anxiety disorder, unspecified: Secondary | ICD-10-CM

## 2018-05-03 NOTE — Telephone Encounter (Signed)
[place] Controlled Database Checked Last filled: 03/28/18 # 90 LOV w/you: 03/15/18 Next appt w/you: 09/14/18

## 2018-05-04 ENCOUNTER — Encounter (INDEPENDENT_AMBULATORY_CARE_PROVIDER_SITE_OTHER): Payer: Self-pay | Admitting: Physical Medicine and Rehabilitation

## 2018-05-04 ENCOUNTER — Ambulatory Visit (INDEPENDENT_AMBULATORY_CARE_PROVIDER_SITE_OTHER): Payer: Medicare Other | Admitting: Physical Medicine and Rehabilitation

## 2018-05-04 ENCOUNTER — Ambulatory Visit (INDEPENDENT_AMBULATORY_CARE_PROVIDER_SITE_OTHER): Payer: Self-pay

## 2018-05-04 VITALS — BP 132/83 | HR 98 | Temp 97.4°F

## 2018-05-04 DIAGNOSIS — M5416 Radiculopathy, lumbar region: Secondary | ICD-10-CM

## 2018-05-04 MED ORDER — METHYLPREDNISOLONE ACETATE 80 MG/ML IJ SUSP
80.0000 mg | Freq: Once | INTRAMUSCULAR | Status: AC
  Administered 2018-05-04: 80 mg

## 2018-05-04 NOTE — Patient Instructions (Signed)

## 2018-05-04 NOTE — Progress Notes (Signed)
 .  Numeric Pain Rating Scale and Functional Assessment Average Pain 10   In the last MONTH (on 0-10 scale) has pain interfered with the following?  1. General activity like being  able to carry out your everyday physical activities such as walking, climbing stairs, carrying groceries, or moving a chair?  Rating(9)   +Driver, -BT, +Dye Allergies(Blue fish dye).

## 2018-05-05 NOTE — Procedures (Signed)
Lumbar Epidural Steroid Injection - Interlaminar Approach with Fluoroscopic Guidance  Patient: Laura Barnes      Date of Birth: 08-09-1940 MRN: [id number] PCP: Binnie Rail, MD      Visit Date: 05/04/2018   Universal Protocol:     Consent Given By: the patient  Position: PRONE  Additional Comments: Vital signs were monitored before and after the procedure. Patient was prepped and draped in the usual sterile fashion. The correct patient, procedure, and site was verified.   Injection Procedure Details:  Procedure Site One Meds Administered:  Meds ordered this encounter  Medications  . methylPREDNISolone acetate (DEPO-MEDROL) injection 80 mg     Laterality: Left, initial placement on the right actually had flow contrast to the left.  Placement in the left epidural space had flow contrast to the to the right.  She has right-sided complaints.  In the future would consider L3-4 injection to get spread of medication along the lateral recess.  Location/Site:  L2-L3  Needle size: 20 G  Needle type: Tuohy  Needle Placement: Paramedian epidural  Findings:   -Comments: Excellent flow of contrast into the epidural space.  Procedure Details: Using a paramedian approach from the side mentioned above, the region overlying the inferior lamina was localized under fluoroscopic visualization and the soft tissues overlying this structure were infiltrated with 4 ml. of 1% Lidocaine without Epinephrine. The Tuohy needle was inserted into the epidural space using a paramedian approach.   The epidural space was localized using loss of resistance along with lateral and bi-planar fluoroscopic views.  After negative aspirate for air, blood, and CSF, a 2 ml. volume of Isovue-250 was injected into the epidural space and the flow of contrast was observed. Radiographs were obtained for documentation purposes.    The injectate was administered into the level noted above.   Additional  Comments:  The patient tolerated the procedure well Dressing: Band-Aid    Post-procedure details: Patient was observed during the procedure. Post-procedure instructions were reviewed.  Patient left the clinic in stable condition.

## 2018-05-05 NOTE — Progress Notes (Signed)
Laura Barnes - 77 y.o. female MRN [id number]  Date of birth: 07-Mar-1941  Office Visit Note: Visit Date: 05/04/2018 PCP: Binnie Rail, MD Referred by: Binnie Rail, MD  Subjective: Chief Complaint  Patient presents with  . Lower Back - Pain  . Right Leg - Pain  . Right Hip - Pain   HPI:  Laura Barnes is a 77 y.o. female who comes in today For planned right L2-3 interlaminar epidural steroid injection as requested by Dr. Leana Gamer.  Patient has fairly large disc extrusion at L2-3 foraminal and lateral recess with extruded fragment.  She has chronic severe right hip and leg pain and groin pain.  Her case is complicated by significant fibromyalgia.  She has had prior injections this year at [facility] which were right L2-3 interlaminar injections.  She had various levels of success with these.  As noted in the procedure report we did try to complete right sided interlaminar injection and did obtain loss of resistance and good flow of contrast epidurally but it was all on the left side because of her scoliosis.  We switched actually to a left-sided injection and did get flow contrast to the right.  ROS Otherwise per HPI.  Assessment & Plan: Visit Diagnoses:  1. Lumbar radiculopathy     Plan: No additional findings.   Meds & Orders:  Meds ordered this encounter  Medications  . methylPREDNISolone acetate (DEPO-MEDROL) injection 80 mg    Orders Placed This Encounter  Procedures  . XR C-ARM NO REPORT  . Epidural Steroid injection    Follow-up: No follow-ups on file.   Procedures: No procedures performed  Lumbar Epidural Steroid Injection - Interlaminar Approach with Fluoroscopic Guidance  Patient: Laura Barnes      Date of Birth: 1941-04-26 MRN: [id number] PCP: Binnie Rail, MD      Visit Date: 05/04/2018   Universal Protocol:     Consent Given By: the patient  Position: PRONE  Additional Comments: Vital signs were monitored before and  after the procedure. Patient was prepped and draped in the usual sterile fashion. The correct patient, procedure, and site was verified.   Injection Procedure Details:  Procedure Site One Meds Administered:  Meds ordered this encounter  Medications  . methylPREDNISolone acetate (DEPO-MEDROL) injection 80 mg     Laterality: Left, initial placement on the right actually had flow contrast to the left.  Placement in the left epidural space had flow contrast to the to the right.  She has right-sided complaints.  In the future would consider L3-4 injection to get spread of medication along the lateral recess.  Location/Site:  L2-L3  Needle size: 20 G  Needle type: Tuohy  Needle Placement: Paramedian epidural  Findings:   -Comments: Excellent flow of contrast into the epidural space.  Procedure Details: Using a paramedian approach from the side mentioned above, the region overlying the inferior lamina was localized under fluoroscopic visualization and the soft tissues overlying this structure were infiltrated with 4 ml. of 1% Lidocaine without Epinephrine. The Tuohy needle was inserted into the epidural space using a paramedian approach.   The epidural space was localized using loss of resistance along with lateral and bi-planar fluoroscopic views.  After negative aspirate for air, blood, and CSF, a 2 ml. volume of Isovue-250 was injected into the epidural space and the flow of contrast was observed. Radiographs were obtained for documentation purposes.    The injectate was administered into the level noted  above.   Additional Comments:  The patient tolerated the procedure well Dressing: Band-Aid    Post-procedure details: Patient was observed during the procedure. Post-procedure instructions were reviewed.  Patient left the clinic in stable condition.   Clinical History: MRI LUMBAR SPINE WITHOUT CONTRAST  TECHNIQUE: Multiplanar, multisequence MR imaging of the lumbar  spine was performed. No intravenous contrast was administered.  COMPARISON:  Multiple exams, including 04/14/2010  FINDINGS: Segmentation: The lowest lumbar type non-rib-bearing vertebra is labeled as L5.  Alignment: Levoconvex lumbar scoliosis centered at the L3 level. The mild rotary component. 5 mm of degenerative retrolisthesis at L2-3 with 4 mm degenerative anterolisthesis at L3-4 and 3 mm degenerative anterolisthesis at L4-5.  Vertebrae: Type 3 degenerative endplate findings at T0-5 and type 2 degenerative endplate findings at W9-7 and L5-S1 with considerable loss of intervertebral disc height at all 3 levels. Facet and perifacet edema on the left at L5-S1.  Conus medullaris and cauda equina: Conus extends to the L1 level. Conus and cauda equina appear normal.  Paraspinal and other soft tissues: A fluid signal intensity lesion of the right kidney is partially included on today's exam and statistically likely to represent a cyst although technically nonspecific.  Disc levels:  T11-12: No impingement.  Small right perineural cyst.  T12-L1: No impingement.  Bilateral perineural cysts.  L1-2: No impingement.  Mild disc bulge.  Bilateral perineural cysts.  L2-3: Prominent right foraminal stenosis with prominent right subarticular lateral recess stenosis and moderate right eccentric central narrowing of the thecal sac, with moderate right and mild left displacement of the L2 nerves in the lateral extraforaminal space, due to a large right lateral recess and foraminal disc protrusion, facet arthropathy, intervertebral spurring, and the degenerative retrolisthesis at this level. The disc protrusion extends caudad about 1.3 cm in the right lateral recess, and this disc protrusion is shown on image 9/6.  L3-4: Moderate right and mild left foraminal stenosis with borderline right subarticular lateral recess stenosis due to disc uncovering, facet spurring, and  intervertebral spurring  L4-5: Mild bilateral subarticular lateral recess stenosis due to disc bulge, facet arthropathy, and ligamentum flavum redundancy. Small bilateral facet joint effusions.  L5-S1: Borderline bilateral subarticular lateral recess stenosis due to disc osteophyte complex. Small left paracentral disc protrusion.  IMPRESSION: 1. Lumbar spondylosis, scoliosis, and degenerative disc disease with multilevel new subluxations the compared to the prior lumbar MRI. The dominant finding is a large right lateral recess and foraminal disc protrusion at the L2-3 level, extending caudad. These factors result in prominent impingement at L2-3; moderate impingement at L3-4; and mild impingement at L4-5, as detailed above.   Electronically Signed   By: Van Clines M.D.   On: 07/25/2017 17:39     Objective:  VS:  HT:    WT:   BMI:     BP:132/83  HR:98bpm  TEMP:(!) 97.4 F (36.3 C)(Oral)  RESP:  Physical Exam  Ortho Exam Imaging: Xr C-arm No Report  Result Date: 05/04/2018 Please see Notes tab for imaging impression.

## 2018-05-06 ENCOUNTER — Encounter (INDEPENDENT_AMBULATORY_CARE_PROVIDER_SITE_OTHER): Payer: Self-pay | Admitting: Physical Medicine and Rehabilitation

## 2018-05-06 ENCOUNTER — Other Ambulatory Visit: Payer: Self-pay

## 2018-05-06 MED ORDER — ONDANSETRON HCL 4 MG PO TABS: ORAL_TABLET | ORAL | 0 refills | Status: DC

## 2018-06-09 ENCOUNTER — Other Ambulatory Visit: Payer: Self-pay | Admitting: Cardiology

## 2018-06-09 NOTE — Telephone Encounter (Signed)
Pt's pharmacy is requesting a refill on benzonatate 200 mg. Would Dr. Meda Coffee like to refill this medication? Please address

## 2018-06-18 ENCOUNTER — Other Ambulatory Visit: Payer: Self-pay | Admitting: Internal Medicine

## 2018-06-18 DIAGNOSIS — F419 Anxiety disorder, unspecified: Secondary | ICD-10-CM

## 2018-06-20 NOTE — Telephone Encounter (Signed)
Last refill 05/03/18 Last routine OV 03/15/18 Next OV 09/14/18

## 2018-06-21 NOTE — Progress Notes (Deleted)
[name] [place], [place] [address] Phone: [phone] Subjective:    I'm seeing this patient by the request  of:    CC:   [id number]  Laura Barnes is a 78 y.o. female coming in with complaint of ***  Onset-  Location Duration-  Character- Aggravating factors- Reliving factors-  Therapies tried-  Severity-     Past Medical History:  Diagnosis Date  . ALLERGIC RHINITIS   . Allergy   . Anemia   . ANXIETY   . Arthritis    hands  . Bronchitis   . Chronic fatigue fibromyalgia syndrome   . Complication of anesthesia    says one time waking up she couldn't breathe, felt like her throat closing up  . DEPRESSION   . Enthesopathy of hip region   . Family history of anesthesia complication    sister with n/v  . FOOT PAIN, BILATERAL   . GERD   . Hiatal hernia   . HYPERLIPIDEMIA   . Hypothyroidism   . IBS   . MIGRAINE HEADACHE   . MIGRAINE, COMMON   . NECK MASS   . OSTEOPENIA   . OSTEOPOROSIS   . PLANTAR FASCIITIS   . RASH-NONVESICULAR   . SINUSITIS- ACUTE-NOS   . SKIN LESION   . VAGINITIS   . VENOUS INSUFFICIENCY, CHRONIC    Past Surgical History:  Procedure Laterality Date  . APPENDECTOMY    . BREAST ENHANCEMENT SURGERY    . BREAST IMPLANT REMOVAL    . CHOLECYSTECTOMY    . COLONOSCOPY    . FOOT SURGERY  11/06/2016  . MASTECTOMY     bilateral for severe bilateral fibrocystic disease  . OOPHORECTOMY    . s/p neck lump removal    . SHOULDER SURGERY    . SINUS ENDO W/FUSION Right 06/30/2013   Procedure: RIGHT ENDOSCOPIC SPHENOIDECTOMY WITH FUSION SCAN;  Surgeon: [name], MD;  Location: [facility];  Service: ENT;  Laterality: Right;  . TONSILLECTOMY    . VAGINAL HYSTERECTOMY     Social History   Socioeconomic History  . Marital status: Widowed    Spouse name: Not on file  . Number of children: 2  . Years of education: Not on file  . Highest education level: Not on file  Occupational History    . Occupation: retired Water quality scientist  . Financial resource strain: Not hard at all  . Food insecurity:    Worry: Never true    Inability: Never true  . Transportation needs:    Medical: No    Non-medical: No  Tobacco Use  . Smoking status: Never Smoker  . Smokeless tobacco: Never Used  Substance and Sexual Activity  . Alcohol use: No  . Drug use: No  . Sexual activity: Not Currently  Lifestyle  . Physical activity:    Days per week: 0 days    Minutes per session: 0 min  . Stress: Not at all  Relationships  . Social connections:    Talks on phone: More than three times a week    Gets together: More than three times a week    Attends religious service: More than 4 times per year    Active member of club or organization: Not on file    Attends meetings of clubs or organizations: More than 4 times per year    Relationship status: Widowed  Other Topics Concern  . Not on file  Social History  Narrative   Has 2 biological children and 1 step child   Allergies  Allergen Reactions  . Prednisone Anaphylaxis    Patient unable to remember why she needed to steroid shot but just knows that when she got back to work she started having a lot of trouble breathing.  (jkl 05/04/14)  Multiple epidural steroid injections with depomedrol with no issues. (05/04/18)  . Amoxicillin-Pot Clavulanate Hives and Diarrhea    Has patient had a PCN reaction causing immediate rash, facial/tongue/throat swelling, SOB or lightheadedness with hypotension: Unknown Has patient had a PCN reaction causing severe rash involving mucus membranes or skin necrosis: Unknown Has patient had a PCN reaction that required hospitalization: Unknown Has patient had a PCN reaction occurring within the last 10 years: Unknown If all of the above answers are "NO", then may proceed with Cephalosporin use.   . Aspirin Other (See Comments)    Stomach ache and bleed  . Erythromycin Diarrhea  . Levofloxacin Other (See  Comments)    Headaches, GI upset  . Nsaids Other (See Comments)    stomach bleeding per pt  . Statins Nausea Only and Other (See Comments)    Headache, upset stomach, joint hurt, heartburn  . Sumatriptan Hives and Palpitations  . Adhesive [Tape] Other (See Comments)    blisters  . Ivp Dye [Iodinated Diagnostic Agents]     If made with blue shellfish then CAN NOT have this type of dye.  Pt was given contrast 10/12/17, not premedicated, had NO COMPLICATIONS- BLO, CT  . Methylphenidate Hcl     Unknown reaction  . Mirtazapine     Unknown reaction  . Shellfish Allergy     Blue shellfish  . Soy Allergy     Unknown reaction  . Doxycycline Diarrhea  . Hydrocodone Itching  . Hydrocodone-Acetaminophen Itching  . Latex Other (See Comments)    blisters  . Rofecoxib Nausea Only  . Sertraline Hcl Nausea Only  . Sulfonamide Derivatives Itching   Family History  Problem Relation Age of Onset  . Diabetes Mother   . Heart disease Father   . Diabetes Sister   . Breast cancer Maternal Grandmother   . Heart disease Maternal Uncle        x 2  . Diabetes Other        multi relatives on father side  . Heart disease Maternal Aunt   . Kidney disease Paternal Uncle        questionable  . Colon cancer Neg Hx   . Colon polyps Neg Hx   . Rectal cancer Neg Hx   . Stomach cancer Neg Hx     Current Outpatient Medications (Endocrine & Metabolic):  .  estradiol (ESTRACE) 0.5 MG tablet, Take 1 tablet (0.5 mg total) by mouth every other day. .  estradiol (ESTRACE) 1 MG tablet, TAKE 1/2 TABLET BY MOUTH EVERY DAY .  levothyroxine (SYNTHROID, LEVOTHROID) 75 MCG tablet, TAKE 1 TABLET BY MOUTH EVERY DAY IN THE MORNING  Current Outpatient Medications (Cardiovascular):  .  triamterene-hydrochlorothiazide (MAXZIDE-25) 37.5-25 MG tablet, Take 1 tablet by mouth daily.  Current Outpatient Medications (Respiratory):  .  benzonatate (TESSALON) 200 MG capsule, TAKE 1 CAPSULE(200 MG) BY MOUTH THREE TIMES DAILY  AS NEEDED FOR COUGH .  cetirizine (ZYRTEC) 10 MG tablet, TAKE 1 TABLET (10 MG TOTAL) BY MOUTH DAILY.  Current Outpatient Medications (Analgesics):  .  acetaminophen (TYLENOL) 500 MG tablet, Take 1,000 mg by mouth every 6 (six) hours as needed for mild  pain, moderate pain, fever or headache.   Current Outpatient Medications (Other):  Marland Kitchen  5-Hydroxytryptophan (5-HTP PO), Take 1 capsule by mouth 2 (two) times daily. Marland Kitchen  ALPRAZolam (XANAX) 0.5 MG tablet, TAKE 1 TABLET BY MOUTH THREE TIMES DAILY AS NEEDED FOR ANXIETY .  AMBULATORY NON FORMULARY MEDICATION, Walker .  Amino Acids (AMINO ACID PO), Take 1 capsule by mouth 2 (two) times daily. .  Ascorbic Acid (VITAMIN C) 1000 MG tablet, Take 1,000 mg by mouth 2 (two) times daily. .  B Complex-Biotin-FA (B COMPLETE) TABS, Take 1 tablet by mouth daily. B Complete 100mg  .  buPROPion (WELLBUTRIN XL) 150 MG 24 hr tablet, TAKE 1 TABLET(150 MG) BY MOUTH EVERY MORNING .  Cholecalciferol (VITAMIN D3) 1000 units CAPS, Take 1,000 Units by mouth daily. .  Coenzyme Q10 (CO Q-10) 200 MG CAPS, Take 1 capsule by mouth 2 (two) times daily. Marland Kitchen  ECHINACEA PO, Take 1 tablet by mouth 2 (two) times daily. Marland Kitchen  LECITHIN PO, Take 1 tablet by mouth daily. Marland Kitchen  MAGNESIUM ASPARTATE PO, Take 1 tablet by mouth 2 (two) times daily. .  meclizine (ANTIVERT) 25 MG tablet, Take 1 tablet (25 mg total) by mouth 3 (three) times daily as needed for dizziness. .  Misc. Devices (ROLLATOR) MISC, Use rollator for ambulation .  Multiple Vitamin (MULTIVITAMIN WITH MINERALS) TABS tablet, Take 1 tablet by mouth daily. .  Omega-3 Fatty Acids (SALMON OIL-1000 PO), Take 1,000 mg by mouth daily. .  ondansetron (ZOFRAN) 4 MG tablet, TAKE 1 TABLET(4 MG) BY MOUTH EVERY 8 HOURS AS NEEDED FOR NAUSEA OR VOMITING .  ondansetron (ZOFRAN-ODT) 8 MG disintegrating tablet, Take 1 tablet (8 mg total) by mouth every 8 (eight) hours as needed for nausea or vomiting. Marland Kitchen  POTASSIUM AMINOBENZOATE PO, Take 1 tablet by mouth  daily. .  TURMERIC PO, Take 1 tablet by mouth 2 (two) times daily.  .  vitamin E 400 UNIT capsule, Take 400 Units by mouth daily.    Past medical history, social, surgical and family history all reviewed in electronic medical record.  No pertanent information unless stated regarding to the chief complaint.   Review of Systems:  No headache, visual changes, nausea, vomiting, diarrhea, constipation, dizziness, abdominal pain, skin rash, fevers, chills, night sweats, weight loss, swollen lymph nodes, body aches, joint swelling, muscle aches, chest pain, shortness of breath, mood changes.   Objective  There were no vitals taken for this visit. Systems examined below as of    General: No apparent distress alert and oriented x3 mood and affect normal, dressed appropriately.  HEENT: Pupils equal, extraocular movements intact  Respiratory: Patient's speak in full sentences and does not appear short of breath  Cardiovascular: No lower extremity edema, non tender, no erythema  Skin: Warm dry intact with no signs of infection or rash on extremities or on axial skeleton.  Abdomen: Soft nontender  Neuro: Cranial nerves II through XII are intact, neurovascularly intact in all extremities with 2+ DTRs and 2+ pulses.  Lymph: No lymphadenopathy of posterior or anterior cervical chain or axillae bilaterally.  Gait normal with good balance and coordination.  MSK:  Non tender with full range of motion and good stability and symmetric strength and tone of shoulders, elbows, wrist, hip, knee and ankles bilaterally.     Impression and Recommendations:     This case required medical decision making of moderate complexity. The above documentation has been reviewed and is accurate and complete Lyndal Pulley, DO  Note: This dictation was prepared with Dragon dictation along with smaller phrase technology. Any transcriptional errors that result from this process are unintentional.

## 2018-06-22 ENCOUNTER — Ambulatory Visit: Payer: Medicare Other | Admitting: Family Medicine

## 2018-07-04 NOTE — Progress Notes (Signed)
[name] [address] [place], [place] [address] Phone: [phone] Subjective:   Laura Barnes, am serving as a scribe for Dr. Hulan Saas.  CC: Pain all over and knee pain  GGE:[id number]   03/03/2018: Patient is having a flare overall.  Discussed icing regimen and home exercises.  Discussed which activities of doing which wants to avoid.  Discussed other over-the-counter medications that could be beneficial but also stated the patient is taking numerous medications and over-the-counter medications and probably needs to decrease.  Patient will try to make some changes.  Follow-up again in 4 weeks  Update 07/05/2018:  Laura Barnes is a 78 y.o. female coming in with complaint of polyarthralgia. Patient had an epidural in December 2019. She did have relief from the injection. Only has pain when she does a lot in one day. Is requesting a prescription for an adjustable bed as she cannot sleep in her bed because of her back.   Is also having bilateral knee pain, right greater than left. Pain is chronic. She states that she feels the pain now because the back pain has diminished.        Past Medical History:  Diagnosis Date  . ALLERGIC RHINITIS   . Allergy   . Anemia   . ANXIETY   . Arthritis    hands  . Bronchitis   . Chronic fatigue fibromyalgia syndrome   . Complication of anesthesia    says one time waking up she couldn't breathe, felt like her throat closing up  . DEPRESSION   . Enthesopathy of hip region   . Family history of anesthesia complication    sister with n/v  . FOOT PAIN, BILATERAL   . GERD   . Hiatal hernia   . HYPERLIPIDEMIA   . Hypothyroidism   . IBS   . MIGRAINE HEADACHE   . MIGRAINE, COMMON   . NECK MASS   . OSTEOPENIA   . OSTEOPOROSIS   . PLANTAR FASCIITIS   . RASH-NONVESICULAR   . SINUSITIS- ACUTE-NOS   . SKIN LESION   . VAGINITIS   . VENOUS INSUFFICIENCY, CHRONIC    Past Surgical History:  Procedure  Laterality Date  . APPENDECTOMY    . BREAST ENHANCEMENT SURGERY    . BREAST IMPLANT REMOVAL    . CHOLECYSTECTOMY    . COLONOSCOPY    . FOOT SURGERY  11/06/2016  . MASTECTOMY     bilateral for severe bilateral fibrocystic disease  . OOPHORECTOMY    . s/p neck lump removal    . SHOULDER SURGERY    . SINUS ENDO W/FUSION Right 06/30/2013   Procedure: RIGHT ENDOSCOPIC SPHENOIDECTOMY WITH FUSION SCAN;  Surgeon: [name], MD;  Location: [facility];  Service: ENT;  Laterality: Right;  . TONSILLECTOMY    . VAGINAL HYSTERECTOMY     Social History   Socioeconomic History  . Marital status: Widowed    Spouse name: Not on file  . Number of children: 2  . Years of education: Not on file  . Highest education level: Not on file  Occupational History  . Occupation: retired Water quality scientist  . Financial resource strain: Not hard at all  . Food insecurity:    Worry: Never true    Inability: Never true  . Transportation needs:    Medical: Barnes    Non-medical: Barnes  Tobacco Use  . Smoking status: Never Smoker  . Smokeless tobacco: Never Used  Substance  and Sexual Activity  . Alcohol use: Barnes  . Drug use: Barnes  . Sexual activity: Not Currently  Lifestyle  . Physical activity:    Days per week: 0 days    Minutes per session: 0 min  . Stress: Not at all  Relationships  . Social connections:    Talks on phone: More than three times a week    Gets together: More than three times a week    Attends religious service: More than 4 times per year    Active member of club or organization: Not on file    Attends meetings of clubs or organizations: More than 4 times per year    Relationship status: Widowed  Other Topics Concern  . Not on file  Social History Narrative   Has 2 biological children and 1 step child   Allergies  Allergen Reactions  . Prednisone Anaphylaxis    Patient unable to remember why she needed to steroid shot but just knows that when she got back to work she started  having a lot of trouble breathing.  (jkl 05/04/14)  Multiple epidural steroid injections with depomedrol with Barnes issues. (05/04/18)  . Amoxicillin-Pot Clavulanate Hives and Diarrhea    Has patient had a PCN reaction causing immediate rash, facial/tongue/throat swelling, SOB or lightheadedness with hypotension: Unknown Has patient had a PCN reaction causing severe rash involving mucus membranes or skin necrosis: Unknown Has patient had a PCN reaction that required hospitalization: Unknown Has patient had a PCN reaction occurring within the last 10 years: Unknown If all of the above answers are "Barnes", then may proceed with Cephalosporin use.   . Aspirin Other (See Comments)    Stomach ache and bleed  . Erythromycin Diarrhea  . Levofloxacin Other (See Comments)    Headaches, GI upset  . Nsaids Other (See Comments)    stomach bleeding per pt  . Statins Nausea Only and Other (See Comments)    Headache, upset stomach, joint hurt, heartburn  . Sumatriptan Hives and Palpitations  . Adhesive [Tape] Other (See Comments)    blisters  . Ivp Dye [Iodinated Diagnostic Agents]     If made with blue shellfish then CAN NOT have this type of dye.  Pt was given contrast 10/12/17, not premedicated, had Barnes COMPLICATIONS- BLO, CT  . Methylphenidate Hcl     Unknown reaction  . Mirtazapine     Unknown reaction  . Shellfish Allergy     Blue shellfish  . Soy Allergy     Unknown reaction  . Doxycycline Diarrhea  . Hydrocodone Itching  . Hydrocodone-Acetaminophen Itching  . Latex Other (See Comments)    blisters  . Rofecoxib Nausea Only  . Sertraline Hcl Nausea Only  . Sulfonamide Derivatives Itching   Family History  Problem Relation Age of Onset  . Diabetes Mother   . Heart disease Father   . Diabetes Sister   . Breast cancer Maternal Grandmother   . Heart disease Maternal Uncle        x 2  . Diabetes Other        multi relatives on father side  . Heart disease Maternal Aunt   . Kidney disease  Paternal Uncle        questionable  . Colon cancer Neg Hx   . Colon polyps Neg Hx   . Rectal cancer Neg Hx   . Stomach cancer Neg Hx     Current Outpatient Medications (Endocrine & Metabolic):  .  estradiol (ESTRACE)  0.5 MG tablet, Take 1 tablet (0.5 mg total) by mouth every other day. .  estradiol (ESTRACE) 1 MG tablet, TAKE 1/2 TABLET BY MOUTH EVERY DAY .  levothyroxine (SYNTHROID, LEVOTHROID) 75 MCG tablet, TAKE 1 TABLET BY MOUTH EVERY DAY IN THE MORNING  Current Outpatient Medications (Cardiovascular):  .  triamterene-hydrochlorothiazide (MAXZIDE-25) 37.5-25 MG tablet, Take 1 tablet by mouth daily.  Current Outpatient Medications (Respiratory):  .  benzonatate (TESSALON) 200 MG capsule, TAKE 1 CAPSULE(200 MG) BY MOUTH THREE TIMES DAILY AS NEEDED FOR COUGH .  cetirizine (ZYRTEC) 10 MG tablet, TAKE 1 TABLET (10 MG TOTAL) BY MOUTH DAILY.  Current Outpatient Medications (Analgesics):  .  acetaminophen (TYLENOL) 500 MG tablet, Take 1,000 mg by mouth every 6 (six) hours as needed for mild pain, moderate pain, fever or headache.   Current Outpatient Medications (Other):  Marland Kitchen  5-Hydroxytryptophan (5-HTP PO), Take 1 capsule by mouth 2 (two) times daily. Marland Kitchen  ALPRAZolam (XANAX) 0.5 MG tablet, TAKE 1 TABLET BY MOUTH THREE TIMES DAILY AS NEEDED FOR ANXIETY .  AMBULATORY NON FORMULARY MEDICATION, Walker .  Amino Acids (AMINO ACID PO), Take 1 capsule by mouth 2 (two) times daily. .  Ascorbic Acid (VITAMIN C) 1000 MG tablet, Take 1,000 mg by mouth 2 (two) times daily. .  B Complex-Biotin-FA (B COMPLETE) TABS, Take 1 tablet by mouth daily. B Complete 100mg  .  buPROPion (WELLBUTRIN XL) 150 MG 24 hr tablet, TAKE 1 TABLET(150 MG) BY MOUTH EVERY MORNING .  Cholecalciferol (VITAMIN D3) 1000 units CAPS, Take 1,000 Units by mouth daily. .  Coenzyme Q10 (CO Q-10) 200 MG CAPS, Take 1 capsule by mouth 2 (two) times daily. Marland Kitchen  ECHINACEA PO, Take 1 tablet by mouth 2 (two) times daily. Marland Kitchen  LECITHIN PO, Take 1  tablet by mouth daily. Marland Kitchen  MAGNESIUM ASPARTATE PO, Take 1 tablet by mouth 2 (two) times daily. .  meclizine (ANTIVERT) 25 MG tablet, Take 1 tablet (25 mg total) by mouth 3 (three) times daily as needed for dizziness. .  Misc. Devices (ROLLATOR) MISC, Use rollator for ambulation .  Multiple Vitamin (MULTIVITAMIN WITH MINERALS) TABS tablet, Take 1 tablet by mouth daily. .  Omega-3 Fatty Acids (SALMON OIL-1000 PO), Take 1,000 mg by mouth daily. .  ondansetron (ZOFRAN) 4 MG tablet, TAKE 1 TABLET(4 MG) BY MOUTH EVERY 8 HOURS AS NEEDED FOR NAUSEA OR VOMITING .  ondansetron (ZOFRAN-ODT) 8 MG disintegrating tablet, Take 1 tablet (8 mg total) by mouth every 8 (eight) hours as needed for nausea or vomiting. Marland Kitchen  POTASSIUM AMINOBENZOATE PO, Take 1 tablet by mouth daily. .  TURMERIC PO, Take 1 tablet by mouth 2 (two) times daily.  .  vitamin E 400 UNIT capsule, Take 400 Units by mouth daily.    Past medical history, social, surgical and family history all reviewed in electronic medical record.  Barnes pertanent information unless stated regarding to the chief complaint.   Review of Systems:  Barnes headache, visual changes, nausea, vomiting, diarrhea, constipation, dizziness, abdominal pain, skin rash, fevers, chills, night sweats, weight loss, swollen lymph nodes,  chest pain, shortness of breath, mood changes.  Positive muscle aches, body aches, joint swelling  Objective  Blood pressure (!) 128/98, pulse 100, height 5' (1.524 m), weight 126 lb (57.2 kg), SpO2 98 %.    General: Barnes apparent distress alert and oriented x3 mood and affect normal, dressed appropriately.  HEENT: Pupils equal, extraocular movements intact  Respiratory: Patient's speak in full sentences and does not appear  short of breath  Cardiovascular: 1+ lower extremity edema, non tender, Barnes erythema  Skin: Warm dry intact with Barnes signs of infection or rash on extremities or on axial skeleton.  Abdomen: Soft nontender  Neuro: Cranial nerves II  through XII are intact, neurovascularly intact in all extremities with 2+ DTRs and 2+ pulses.  Lymph: Barnes lymphadenopathy of posterior or anterior cervical chain or axillae bilaterally.  Gait antalgic gait MSK:  tender with limited range of motion difficult to assess secondary to patient having pain that is out of proportion to the amount of palpation in numerous different areas.  Knee: bilaterally  valgus deformity noted.  Abnormal thigh to calf ratio.  Tender to palpation over medial and PF joint line.  ROM full in flexion and extension and lower leg rotation. instability with valgus force.  painful patellar compression. Patellar glide with moderate crepitus. Patellar and quadriceps tendons unremarkable. Hamstring and quadriceps strength is normal.  After informed written and verbal consent, patient was seated on exam table. Right knee was prepped with alcohol swab and utilizing anterolateral approach, patient's right knee space was injected with 4:1  marcaine 0.5%: Kenalog 40mg /dL. Patient tolerated the procedure well without immediate complications.  After informed written and verbal consent, patient was seated on exam table. Left knee was prepped with alcohol swab and utilizing anterolateral approach, patient's left knee space was injected with 4:1  marcaine 0.5%: Kenalog 40mg /dL. Patient tolerated the procedure well without immediate complications.    Impression and Recommendations:     This case required medical decision making of moderate complexity. The above documentation has been reviewed and is accurate and complete Lyndal Pulley, DO       Note: This dictation was prepared with Dragon dictation along with smaller phrase technology. Any transcriptional errors that result from this process are unintentional.

## 2018-07-05 ENCOUNTER — Ambulatory Visit: Payer: Medicare Other | Admitting: Family Medicine

## 2018-07-05 ENCOUNTER — Encounter: Payer: Self-pay | Admitting: Family Medicine

## 2018-07-05 ENCOUNTER — Telehealth: Payer: Self-pay | Admitting: Cardiology

## 2018-07-05 VITALS — BP 128/98 | HR 100 | Ht 60.0 in | Wt 126.0 lb

## 2018-07-05 DIAGNOSIS — G8929 Other chronic pain: Secondary | ICD-10-CM

## 2018-07-05 DIAGNOSIS — M25561 Pain in right knee: Secondary | ICD-10-CM | POA: Diagnosis not present

## 2018-07-05 DIAGNOSIS — M170 Bilateral primary osteoarthritis of knee: Secondary | ICD-10-CM | POA: Diagnosis not present

## 2018-07-05 DIAGNOSIS — M25562 Pain in left knee: Secondary | ICD-10-CM | POA: Diagnosis not present

## 2018-07-05 DIAGNOSIS — R600 Localized edema: Secondary | ICD-10-CM

## 2018-07-05 NOTE — Patient Instructions (Signed)
Good to see you  Injected the knees bilaterally today  Xray downstairs  I would see the heart doc with the sleepiness.  Give the swelling a week in the legs and if not better stop the vitamin E See me again in 6-8 weeks

## 2018-07-05 NOTE — Assessment & Plan Note (Signed)
Bilateral injections given today.  X-rays pending.  Given Kenalog and patient does have an allergy to prednisone but has responded well to epidurals in the back.  Patient did respond well and was watched for 15 minutes before patient was able to leave.  Home exercises given.  Does have trace effusion of the bilateral legs and encourage patient to follow-up with primary care provider or cardiologist.  Patient will follow-up with me again in 6 weeks.  Could be a candidate for Visco supplementation.  Patient has had difficulty being compliant with treatment previously.  Unable to afford formal physical therapy

## 2018-07-05 NOTE — Telephone Encounter (Signed)
New Message   PT saw her PC doctor today and he told her she should follow up with her Cardiologist because she has been having headache and is having unusual swelling   I made her an appt that was available for tomorrow 02-05 at 1:20

## 2018-07-05 NOTE — Telephone Encounter (Signed)
Left the patient a voicemail to call back.  

## 2018-07-05 NOTE — Assessment & Plan Note (Signed)
Patient will follow-up with primary care provider or cardiologist no chest pain

## 2018-07-05 NOTE — Telephone Encounter (Signed)
Spoke with the patient, she stated that her swelling was getting worse and she used to be on lasix in the past by PCP, but the medication stopped working well enough. She sleeps in a recliner and wakes up to swelling, even if elevated feet. She states this swelling has been going on for a couple of weeks, but has gotten worse. The patient stated she was fine to wait till tomorrow to see Dr. Meda Coffee.

## 2018-07-05 NOTE — Telephone Encounter (Signed)
I will route to Triage nurse for further evaluation. Pt does have appt tomorrow with Dr. Meda Coffee 07/06/18.

## 2018-07-06 ENCOUNTER — Other Ambulatory Visit: Payer: Self-pay | Admitting: Internal Medicine

## 2018-07-06 ENCOUNTER — Ambulatory Visit: Payer: Medicare Other | Admitting: Cardiology

## 2018-07-06 ENCOUNTER — Encounter: Payer: Self-pay | Admitting: Cardiology

## 2018-07-06 VITALS — BP 146/70 | HR 94 | Ht 60.0 in | Wt 131.2 lb

## 2018-07-06 DIAGNOSIS — I872 Venous insufficiency (chronic) (peripheral): Secondary | ICD-10-CM | POA: Diagnosis not present

## 2018-07-06 DIAGNOSIS — R42 Dizziness and giddiness: Secondary | ICD-10-CM | POA: Diagnosis not present

## 2018-07-06 DIAGNOSIS — R0600 Dyspnea, unspecified: Secondary | ICD-10-CM

## 2018-07-06 DIAGNOSIS — I25119 Atherosclerotic heart disease of native coronary artery with unspecified angina pectoris: Secondary | ICD-10-CM

## 2018-07-06 DIAGNOSIS — E785 Hyperlipidemia, unspecified: Secondary | ICD-10-CM

## 2018-07-06 DIAGNOSIS — M2550 Pain in unspecified joint: Secondary | ICD-10-CM | POA: Diagnosis not present

## 2018-07-06 DIAGNOSIS — R0609 Other forms of dyspnea: Secondary | ICD-10-CM

## 2018-07-06 NOTE — Progress Notes (Signed)
Cardiology Office Note    Date:  2018/07/22   ID:  Laura Barnes, DOB 07/13/1940, MRN [id number]  PCP:  Laura Rail, MD  Cardiologist:  Laura Dawley, MD   Reason for visit: Dizziness  History of Present Illness:  Laura Barnes is a 78 y.o. female was a very sweet patient, wife of my previous patient Laura Barnes who recently passed away secondary to cancer. She was seen for DOE in 07/2017 and underwent coronary CTA that showed coronary calcium score of 18. This was 36 percentile for age and sex matched control. 2. Normal coronary origin with right dominance.  Minimal non-obstructive CAD. She was previously evaluated by pulmonary with no significant finding on pulmonary function test or chest CT. She has previously been tried on different statins including atorvastatin, rosuvastatin and including red yeast rice and they all caused significant muscle pain.she has been under a lot of stress with her husband's terminal disease and death.  22-Jul-2018 - 1 year follow up -the patient states that she has been dealing with polyarthralgias and back pain.  She otherwise denies chest pain or shortness of breath, she has occasional dizziness where she feels like she loses balance and on one occasion he she hit the wall with head injury.  No syncope.  No lower extremity edema palpitations or claudications.  She cannot tolerate any statins but has been super compliant with healthy diet.   Past Medical History:  Diagnosis Date  . ALLERGIC RHINITIS   . Allergy   . Anemia   . ANXIETY   . Arthritis    hands  . Bronchitis   . Chronic fatigue fibromyalgia syndrome   . Complication of anesthesia    says one time waking up she couldn't breathe, felt like her throat closing up  . DEPRESSION   . Enthesopathy of hip region   . Family history of anesthesia complication    sister with n/v  . FOOT PAIN, BILATERAL   . GERD   . Hiatal hernia   . HYPERLIPIDEMIA   . Hypothyroidism   .  IBS   . MIGRAINE HEADACHE   . MIGRAINE, COMMON   . NECK MASS   . OSTEOPENIA   . OSTEOPOROSIS   . PLANTAR FASCIITIS   . RASH-NONVESICULAR   . SINUSITIS- ACUTE-NOS   . SKIN LESION   . VAGINITIS   . VENOUS INSUFFICIENCY, CHRONIC     Past Surgical History:  Procedure Laterality Date  . APPENDECTOMY    . BREAST ENHANCEMENT SURGERY    . BREAST IMPLANT REMOVAL    . CHOLECYSTECTOMY    . COLONOSCOPY    . FOOT SURGERY  11/06/2016  . MASTECTOMY     bilateral for severe bilateral fibrocystic disease  . OOPHORECTOMY    . s/p neck lump removal    . SHOULDER SURGERY    . SINUS ENDO W/FUSION Right 06/30/2013   Procedure: RIGHT ENDOSCOPIC SPHENOIDECTOMY WITH FUSION SCAN;  Surgeon: [name], MD;  Location: [facility];  Service: ENT;  Laterality: Right;  . TONSILLECTOMY    . VAGINAL HYSTERECTOMY      Current Medications: Outpatient Medications Prior to Visit  Medication Sig Dispense Refill  . 5-Hydroxytryptophan (5-HTP PO) Take 1 capsule by mouth 2 (two) times daily.    Marland Kitchen acetaminophen (TYLENOL) 500 MG tablet Take 1,000 mg by mouth every 6 (six) hours as needed for mild pain, moderate pain, fever or headache.    . ALPRAZolam (XANAX) 0.5 MG tablet TAKE  1 TABLET BY MOUTH THREE TIMES DAILY AS NEEDED FOR ANXIETY 90 tablet 0  . AMBULATORY NON FORMULARY MEDICATION Walker 1 Device 0  . Amino Acids (AMINO ACID PO) Take 1 capsule by mouth 2 (two) times daily.    . Ascorbic Acid (VITAMIN C) 1000 MG tablet Take 1,000 mg by mouth 2 (two) times daily.    . B Complex-Biotin-FA (B COMPLETE) TABS Take 1 tablet by mouth daily. B Complete 100mg     . benzonatate (TESSALON) 200 MG capsule TAKE 1 CAPSULE(200 MG) BY MOUTH THREE TIMES DAILY AS NEEDED FOR COUGH 90 capsule 6  . buPROPion (WELLBUTRIN XL) 150 MG 24 hr tablet TAKE 1 TABLET(150 MG) BY MOUTH EVERY MORNING 90 tablet 1  . cetirizine (ZYRTEC) 10 MG tablet TAKE 1 TABLET (10 MG TOTAL) BY MOUTH DAILY. 30 tablet 11  . Cholecalciferol (VITAMIN D3) 1000 units  CAPS Take 1,000 Units by mouth daily.    . Coenzyme Q10 (CO Q-10) 200 MG CAPS Take 1 capsule by mouth 2 (two) times daily.    Marland Kitchen ECHINACEA PO Take 1 tablet by mouth 2 (two) times daily.    Marland Kitchen estradiol (ESTRACE) 0.5 MG tablet Take 1 tablet (0.5 mg total) by mouth every other day.    . estradiol (ESTRACE) 1 MG tablet TAKE 1/2 TABLET BY MOUTH EVERY DAY 45 tablet 0  . LECITHIN PO Take 1 tablet by mouth daily.    Marland Kitchen levothyroxine (SYNTHROID, LEVOTHROID) 75 MCG tablet TAKE 1 TABLET BY MOUTH EVERY DAY IN THE MORNING 90 tablet 0  . MAGNESIUM ASPARTATE PO Take 1 tablet by mouth 2 (two) times daily.    . meclizine (ANTIVERT) 25 MG tablet Take 1 tablet (25 mg total) by mouth 3 (three) times daily as needed for dizziness. 30 tablet 5  . Misc. Devices (ROLLATOR) MISC Use rollator for ambulation 1 each 0  . Multiple Vitamin (MULTIVITAMIN WITH MINERALS) TABS tablet Take 1 tablet by mouth daily.    . Omega-3 Fatty Acids (SALMON OIL-1000 PO) Take 1,000 mg by mouth daily.    . ondansetron (ZOFRAN) 4 MG tablet TAKE 1 TABLET(4 MG) BY MOUTH EVERY 8 HOURS AS NEEDED FOR NAUSEA OR VOMITING 30 tablet 0  . ondansetron (ZOFRAN-ODT) 8 MG disintegrating tablet Take 1 tablet (8 mg total) by mouth every 8 (eight) hours as needed for nausea or vomiting. 30 tablet 2  . POTASSIUM AMINOBENZOATE PO Take 1 tablet by mouth daily.    Marland Kitchen triamterene-hydrochlorothiazide (MAXZIDE-25) 37.5-25 MG tablet Take 1 tablet by mouth daily. 90 tablet 3  . TURMERIC PO Take 1 tablet by mouth 2 (two) times daily.     . vitamin E 400 UNIT capsule Take 400 Units by mouth daily.     No facility-administered medications prior to visit.      Allergies:   Prednisone; Amoxicillin-pot clavulanate; Aspirin; Erythromycin; Levofloxacin; Nsaids; Statins; Sumatriptan; Adhesive [tape]; Ivp dye [iodinated diagnostic agents]; Methylphenidate hcl; Mirtazapine; Shellfish allergy; Soy allergy; Doxycycline; Hydrocodone; Hydrocodone-acetaminophen; Latex; Rofecoxib;  Sertraline hcl; and Sulfonamide derivatives   Social History   Socioeconomic History  . Marital status: Widowed    Spouse name: Not on file  . Number of children: 2  . Years of education: Not on file  . Highest education level: Not on file  Occupational History  . Occupation: retired Water quality scientist  . Financial resource strain: Not hard at all  . Food insecurity:    Worry: Never true    Inability: Never true  . Transportation needs:  Medical: No    Non-medical: No  Tobacco Use  . Smoking status: Never Smoker  . Smokeless tobacco: Never Used  Substance and Sexual Activity  . Alcohol use: No  . Drug use: No  . Sexual activity: Not Currently  Lifestyle  . Physical activity:    Days per week: 0 days    Minutes per session: 0 min  . Stress: Not at all  Relationships  . Social connections:    Talks on phone: More than three times a week    Gets together: More than three times a week    Attends religious service: More than 4 times per year    Active member of club or organization: Not on file    Attends meetings of clubs or organizations: More than 4 times per year    Relationship status: Widowed  Other Topics Concern  . Not on file  Social History Narrative   Has 2 biological children and 1 step child     Family History:  The patient's family history includes Breast cancer in her maternal grandmother; Diabetes in her mother, sister, and another family member; Heart disease in her father, maternal aunt, and maternal uncle; Kidney disease in her paternal uncle.   ROS:   Please see the history of present illness.    ROS All other systems reviewed and are negative.  PHYSICAL EXAM:   VS:  BP (!) 146/70   Pulse 94   Ht 5' (1.524 m)   Wt 131 lb 3.2 oz (59.5 kg)   SpO2 98%   BMI 25.62 kg/m    GEN: Well nourished, well developed, in no acute distress  HEENT: normal  Neck: no JVD, carotid bruits, or masses Cardiac: RRR; no murmurs, rubs, or gallops,mild B/L  edema  Respiratory:  clear to auscultation bilaterally, normal work of breathing GI: soft, nontender, nondistended, + BS MS: no deformity or atrophy  Skin: warm and dry, no rash Neuro:  Alert and Oriented x 3, Strength and sensation are intact Psych: euthymic mood, full affect  Wt Readings from Last 3 Encounters:  07/06/18 131 lb 3.2 oz (59.5 kg)  07/05/18 126 lb (57.2 kg)  04/19/18 126 lb (57.2 kg)    Studies/Labs Reviewed:   EKG:  EKG is ordered today.  The ekg ordered today demonstrates Normal sinus rhythm, normal EKG.  Recent Labs: 03/15/2018: ALT 16; BUN 12; Creatinine, Ser 0.74; Hemoglobin 13.6; Platelets 303.0; Potassium 3.6; Sodium 128; TSH 1.56   Lipid Panel    Component Value Date/Time   CHOL 178 09/13/2017 1515   TRIG 63.0 09/13/2017 1515   HDL 72.00 09/13/2017 1515   CHOLHDL 2 09/13/2017 1515   VLDL 12.6 09/13/2017 1515   LDLCALC 93 09/13/2017 1515   LDLDIRECT 119.6 04/06/2013 1228    Additional studies/ records that were reviewed today include:   Coronary CTA 09/2017 1. Coronary calcium score of 18. This was 7 percentile for age and sex matched control. 2. Normal coronary origin with right dominance. 3.  Minimal non-obstructive CAD.   08/27/2017  Left ventricle: The cavity size was normal. There was severe   focal basal and mild concentric hypertrophy. Systolic function   was normal. The estimated ejection fraction was in the range of   60% to 65%. Wall motion was normal; there were no regional wall   motion abnormalities. There was an increased relative   contribution of atrial contraction to ventricular filling.   Doppler parameters are consistent with abnormal left ventricular  relaxation (grade 1 diastolic dysfunction). - Pulmonary arteries: Systolic pressure could not be accurately   estimated.   ASSESSMENT:    1. Polyarthralgia   2. DOE (dyspnea on exertion)   3. Hyperlipidemia, unspecified hyperlipidemia type   4. Chronic venous  insufficiency   5. Dizziness   6. Coronary artery disease with angina pectoris, unspecified vessel or lesion type, unspecified whether native or transplanted heart ([place])      PLAN:  In order of problems listed above:  1. Minimal non-obstructive coronary artery disease on coronary CTA in 07/2017.  All lipids are at goal, she is advised to continue with healthy diet, anti-inflammatory supplements and fish oil. 2. Hyperlipidemia -as above.   3. Pulmonary arthralgias -we will refer her to Dr. Estanislado Pandy in rheumatology. 4. Dizziness -we will obtain bilateral carotid ultrasound.   Medication Adjustments/Labs and Tests Ordered: Current medicines are reviewed at length with the patient today.  Concerns regarding medicines are outlined above.  Medication changes, Labs and Tests ordered today are listed in the Patient Instructions below. Patient Instructions  Medication Instructions:   Your physician recommends that you continue on your current medications as directed. Please refer to the Current Medication list given to you today.  If you need a refill on your cardiac medications before your next appointment, please call your pharmacy.    Testing/Procedures:  Your physician has requested that you have a carotid duplex. This test is an ultrasound of the carotid arteries in your neck. It looks at blood flow through these arteries that supply the brain with blood. Allow one hour for this exam. There are no restrictions or special instructions.   You have been referred to RHEUMATOLOGY TO SEE DR. Estanislado Pandy    Follow-Up: At [facility], you and your health needs are our priority.  As part of our continuing mission to provide you with exceptional heart care, we have created designated Provider Care Teams.  These Care Teams include your primary Cardiologist (physician) and Advanced Practice Providers (APPs -  Physician Assistants and Nurse Practitioners) who all work together to provide you with  the care you need, when you need it. You will need a follow up appointment in 12 months with Dr. Meda Coffee.  Please call our office 2 months in advance to schedule this appointment.  You may see No primary care provider on file. or one of the following Advanced Practice Providers on your designated Care Team:   Lyda Jester, PA-C Melina Copa, PA-C . Ermalinda Barrios, PA-C        Signed, Laura Dawley, MD  07/06/2018 2:56 PM    [facility] [place], [place], [place]  [address] Phone: [phone]; Fax: [phone]

## 2018-07-06 NOTE — Patient Instructions (Signed)
Medication Instructions:   Your physician recommends that you continue on your current medications as directed. Please refer to the Current Medication list given to you today.  If you need a refill on your cardiac medications before your next appointment, please call your pharmacy.    Testing/Procedures:  Your physician has requested that you have a carotid duplex. This test is an ultrasound of the carotid arteries in your neck. It looks at blood flow through these arteries that supply the brain with blood. Allow one hour for this exam. There are no restrictions or special instructions.   You have been referred to RHEUMATOLOGY TO SEE DR. Estanislado Pandy    Follow-Up: At [facility], you and your health needs are our priority.  As part of our continuing mission to provide you with exceptional heart care, we have created designated Provider Care Teams.  These Care Teams include your primary Cardiologist (physician) and Advanced Practice Providers (APPs -  Physician Assistants and Nurse Practitioners) who all work together to provide you with the care you need, when you need it. You will need a follow up appointment in 12 months with Dr. Meda Coffee.  Please call our office 2 months in advance to schedule this appointment.  You may see No primary care provider on file. or one of the following Advanced Practice Providers on your designated Care Team:   Lyda Jester, PA-C Melina Copa, PA-C . Ermalinda Barrios, PA-C

## 2018-07-08 ENCOUNTER — Ambulatory Visit (INDEPENDENT_AMBULATORY_CARE_PROVIDER_SITE_OTHER)
Admission: RE | Admit: 2018-07-08 | Discharge: 2018-07-08 | Disposition: A | Discharge status: Home / Self Care | Payer: Medicare Other | Source: Ambulatory Visit | Attending: Family Medicine | Admitting: Family Medicine

## 2018-07-08 ENCOUNTER — Telehealth: Payer: Self-pay | Admitting: Family Medicine

## 2018-07-08 DIAGNOSIS — G8929 Other chronic pain: Secondary | ICD-10-CM | POA: Diagnosis not present

## 2018-07-08 DIAGNOSIS — M25561 Pain in right knee: Secondary | ICD-10-CM | POA: Diagnosis not present

## 2018-07-08 DIAGNOSIS — M25562 Pain in left knee: Secondary | ICD-10-CM

## 2018-07-08 DIAGNOSIS — M170 Bilateral primary osteoarthritis of knee: Secondary | ICD-10-CM | POA: Diagnosis not present

## 2018-07-08 NOTE — Telephone Encounter (Signed)
Patient came into the office stating that she was here to see Dr Tamala Julian on 07/05/2018 and spoke to him about getting an order for an adjustable bed due to back issues. She said that she forgot to get the order before she left. If this can be done, she would like it mailed to her home with information on how to go about ordering it or where to get one.

## 2018-07-11 MED ORDER — AMBULATORY NON FORMULARY MEDICATION: 0 refills | Status: DC

## 2018-07-11 NOTE — Telephone Encounter (Signed)
I am fine with that ifwe can write it, usually we make people pick up prescriptioins though

## 2018-07-11 NOTE — Telephone Encounter (Signed)
Prescription printed

## 2018-07-15 ENCOUNTER — Ambulatory Visit (HOSPITAL_COMMUNITY)
Admission: RE | Admit: 2018-07-15 | Discharge: 2018-07-15 | Disposition: A | Discharge status: Home / Self Care | Payer: Medicare Other | Source: Ambulatory Visit | Attending: Cardiology | Admitting: Cardiology

## 2018-07-15 DIAGNOSIS — R42 Dizziness and giddiness: Secondary | ICD-10-CM | POA: Insufficient documentation

## 2018-07-18 DIAGNOSIS — H35432 Paving stone degeneration of retina, left eye: Secondary | ICD-10-CM | POA: Diagnosis not present

## 2018-07-18 DIAGNOSIS — H353122 Nonexudative age-related macular degeneration, left eye, intermediate dry stage: Secondary | ICD-10-CM | POA: Diagnosis not present

## 2018-07-18 DIAGNOSIS — H33322 Round hole, left eye: Secondary | ICD-10-CM | POA: Diagnosis not present

## 2018-07-18 DIAGNOSIS — H35453 Secondary pigmentary degeneration, bilateral: Secondary | ICD-10-CM | POA: Diagnosis not present

## 2018-07-18 DIAGNOSIS — H43393 Other vitreous opacities, bilateral: Secondary | ICD-10-CM | POA: Diagnosis not present

## 2018-07-18 DIAGNOSIS — H353111 Nonexudative age-related macular degeneration, right eye, early dry stage: Secondary | ICD-10-CM | POA: Diagnosis not present

## 2018-07-19 ENCOUNTER — Telehealth: Payer: Self-pay | Admitting: *Deleted

## 2018-07-19 NOTE — Telephone Encounter (Signed)
Pts new pt appt to see Dr Estanislado Pandy Rheumatologist, is scheduled for 08/22/18 at 0945.  Pt made aware of appt date and time by their office.

## 2018-07-20 ENCOUNTER — Telehealth: Payer: Self-pay

## 2018-07-20 DIAGNOSIS — E785 Hyperlipidemia, unspecified: Secondary | ICD-10-CM

## 2018-07-20 NOTE — Telephone Encounter (Signed)
-----   Message from Sueanne Margarita, MD sent at 07/19/2018  9:33 AM EST ----- Dopplers showed 1-39% bilateral carotid stenosis - repeat dopplers in 2 years.  Please have her come in for fasting lipid given her carotid dz

## 2018-07-20 NOTE — Telephone Encounter (Signed)
The patient has been notified of the result and verbalized understanding.  All questions (if any) were answered.She is coming for fasting labs on 07/26/18. Sarina Ill, RN 07/20/2018 4:03 PM

## 2018-07-25 ENCOUNTER — Other Ambulatory Visit: Payer: Medicare Other

## 2018-07-25 DIAGNOSIS — E785 Hyperlipidemia, unspecified: Secondary | ICD-10-CM

## 2018-07-25 LAB — LIPID PANEL
Chol/HDL Ratio: 2.6 ratio (ref 0.0–4.4)
Cholesterol, Total: 202 mg/dL — ABNORMAL HIGH (ref 100–199)
HDL: 78 mg/dL (ref >39)
LDL Calculated: 105 mg/dL — ABNORMAL HIGH (ref 0–99)
Triglycerides: 97 mg/dL (ref 0–149)
VLDL Cholesterol Cal: 19 mg/dL (ref 5–40)

## 2018-07-26 ENCOUNTER — Encounter: Payer: Self-pay | Admitting: *Deleted

## 2018-07-26 ENCOUNTER — Other Ambulatory Visit: Payer: Medicare Other

## 2018-07-27 IMAGING — US US THYROID
1 series · 14 of 25 positions shown · non-contrast
Comparison: None.

CLINICAL DATA: Nodule noted by CT

EXAM:
THYROID ULTRASOUND
TECHNIQUE: Ultrasound examination of the thyroid gland and adjacent soft
tissues was performed.

[Series 1: us thyroid · 0.06mm/px · 14 of 41 slices shown]
[im 1/41]
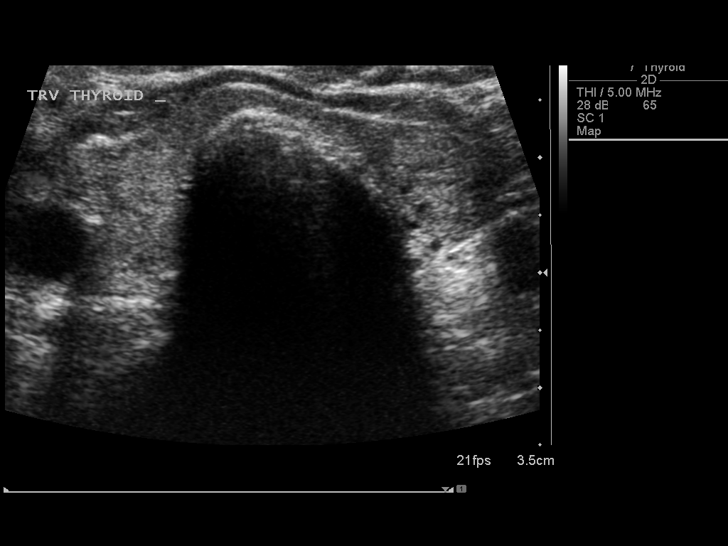
[im 4/41]
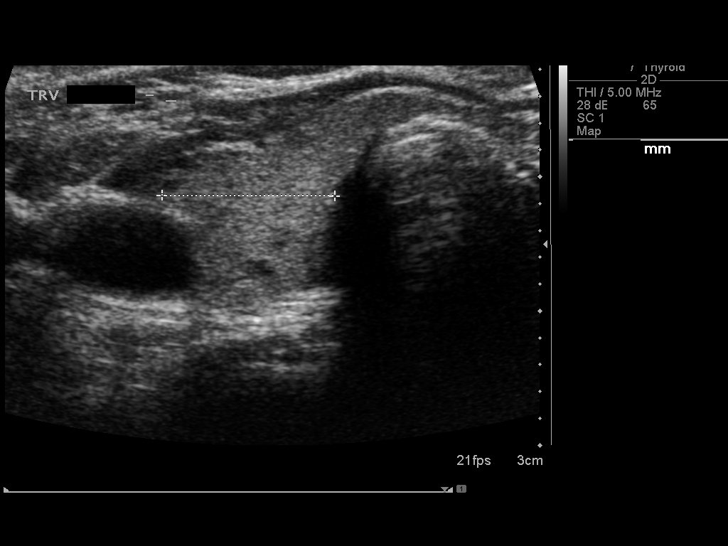
[im 7/41]
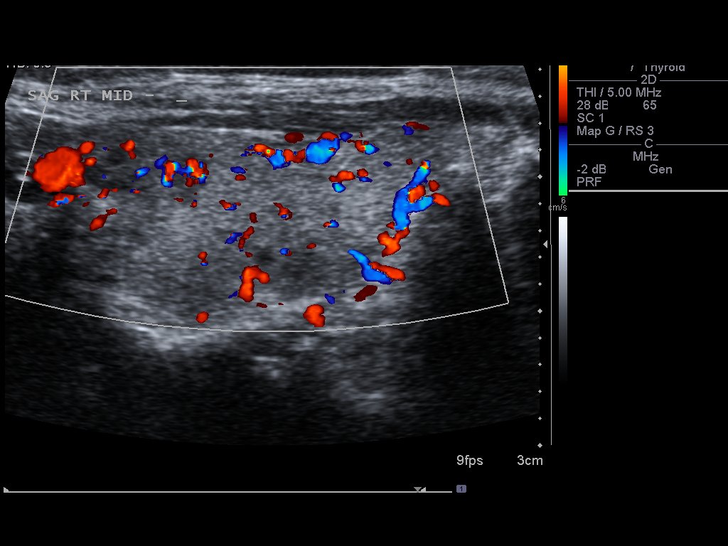
[im 11/41]
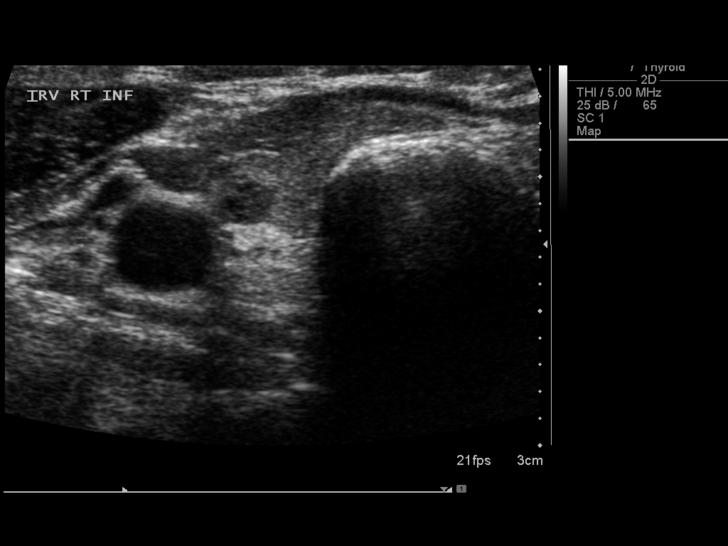
[im 14/41]
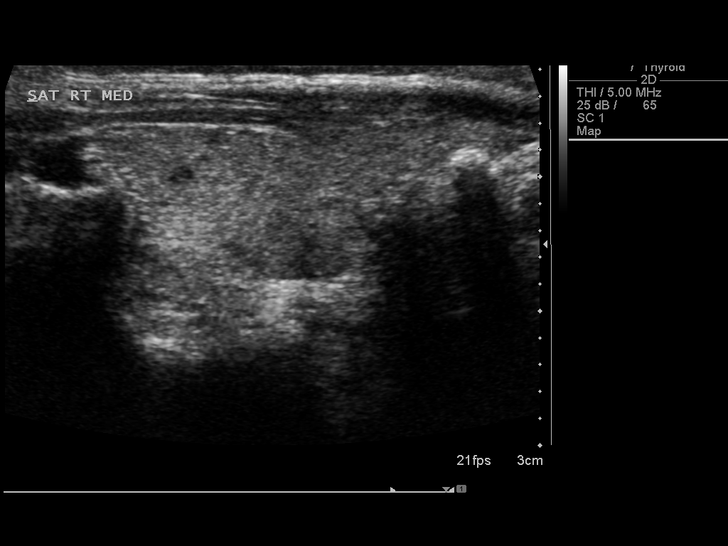
[im 16/41]
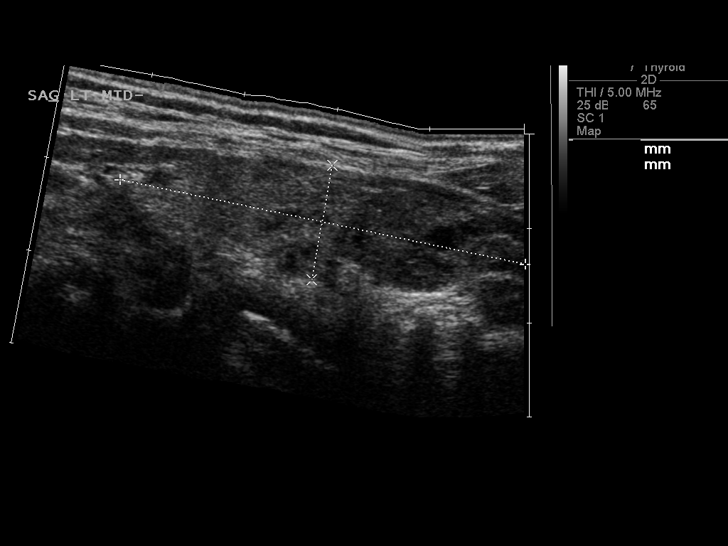
[im 19/41]
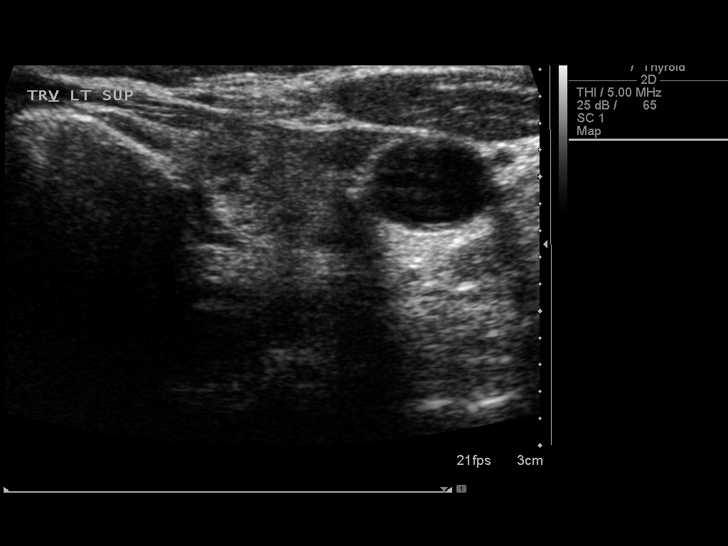
[im 22/41]
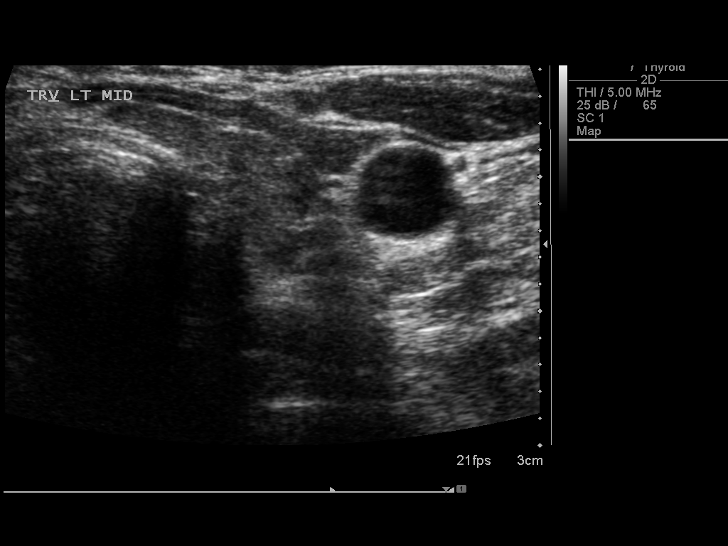
[im 26/41]
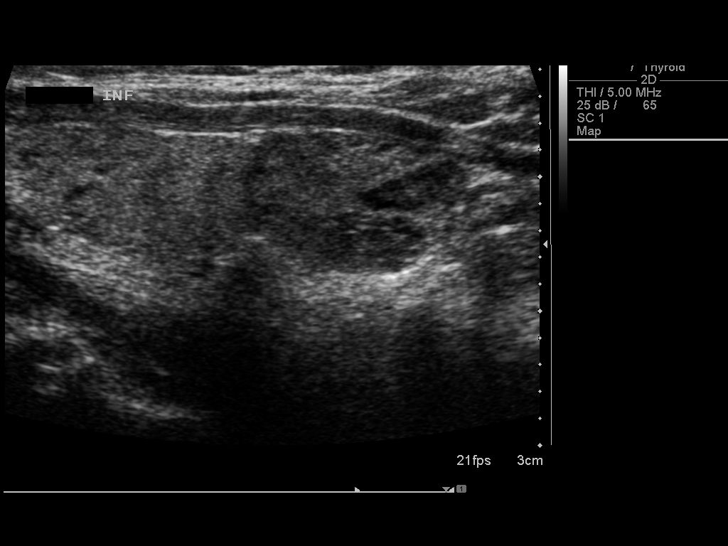
[im 27/41]
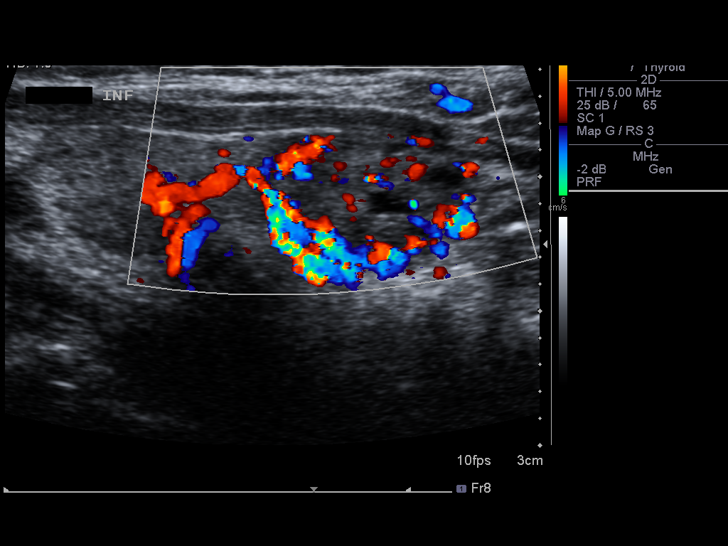
[im 31/41]
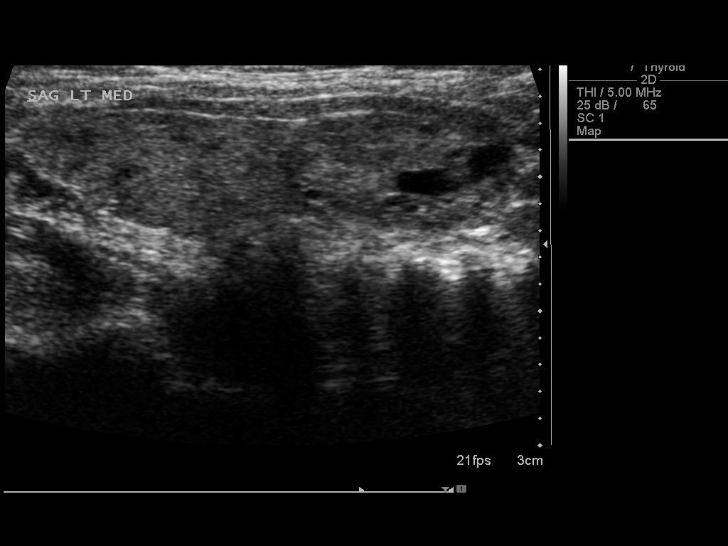
[im 34/41]
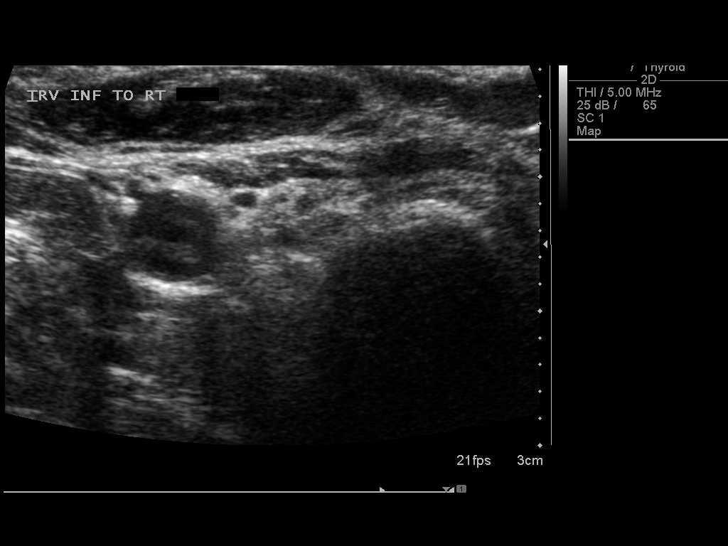
[im 37/41]
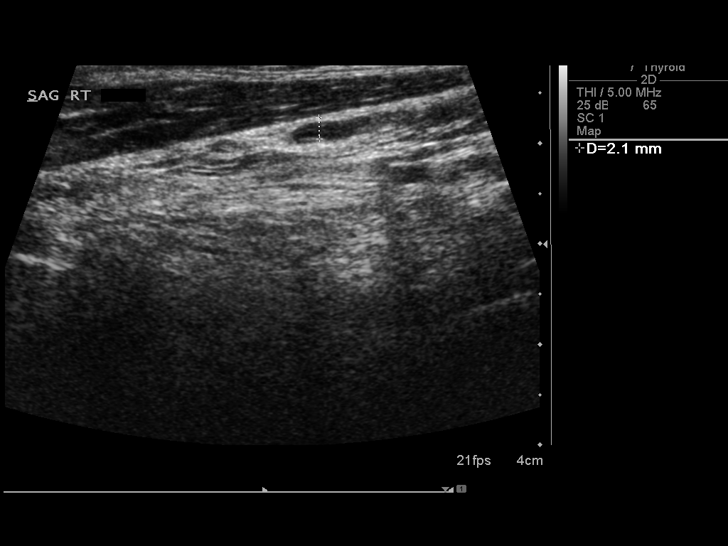
[im 41/41]
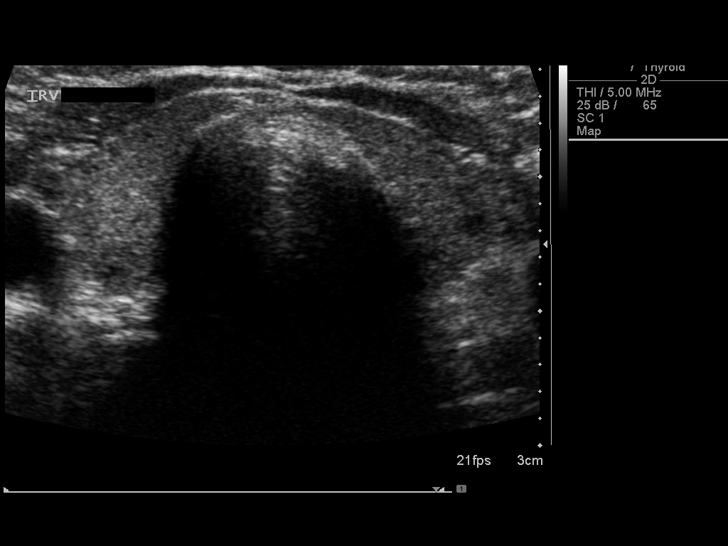

[14 of 25 positions shown; findings below may reference images not displayed]

FINDINGS: Right thyroid lobe

Measurements: 3.6 x 1.4 x 1.3 cm. Hypoechoic lower pole nodule
measures 0.4 cm.

Left thyroid lobe

Measurements: 4.4 x 1.2 x 0.9 cm. Heterogeneous predominately solid
nodule in the lower pole measures 1.7 x 1.1 x 1.4 cm.

Isthmus

Thickness: 0.3 cm.  No nodules visualized.

Lymphadenopathy

None visualized.
IMPRESSION: Bilateral nodules. Left lower pole nodule measured 1.7 cm. Findings
meet consensus criteria for biopsy. Ultrasound-guided fine needle
aspiration should be considered, as per the consensus statement:
Management of Thyroid Nodules Detected at US: Society of
Radiologists in Ultrasound Consensus Conference Statement. Radiology

## 2018-07-29 ENCOUNTER — Other Ambulatory Visit: Payer: Self-pay | Admitting: Internal Medicine

## 2018-08-01 ENCOUNTER — Other Ambulatory Visit: Payer: Self-pay | Admitting: Internal Medicine

## 2018-08-01 DIAGNOSIS — F419 Anxiety disorder, unspecified: Secondary | ICD-10-CM

## 2018-08-01 NOTE — Telephone Encounter (Signed)
Last OV 03/15/18 Next OV 09/13/17  [place] Controlled Substance Database checked. Last filled on 06/20/18

## 2018-08-08 DIAGNOSIS — R040 Epistaxis: Secondary | ICD-10-CM | POA: Insufficient documentation

## 2018-08-08 DIAGNOSIS — J310 Chronic rhinitis: Secondary | ICD-10-CM | POA: Diagnosis not present

## 2018-08-08 NOTE — Progress Notes (Signed)
Office Visit Note  Patient: Laura Barnes             Date of Birth: 24-Jul-1940           MRN: [id number]             PCP: Binnie Rail, MD Referring: Dorothy Spark, MD Visit Date: 08/09/2018 Occupation: Retired  Subjective:  Pain in both hands and wrists.   History of Present Illness: Laura Barnes is a 78 y.o. female with history of osteoarthritis and fibromyalgia.  She states she has been experiencing progressive pain and discomfort in her bilateral hands and wrist for last 1-1/2-year.  She states she has noticed decreased grip strength and also difficulty with dexterity.  She has some discomfort in her knee joints.  She also has discomfort in her ankles and feet but no joint swelling.  She states her neck gets stiff after prolonged sitting.  She also had lower back pain which was quite severe.  She had epidural injection in her lower back in December after that her back pain got better.  She has longstanding history of fibromyalgia.  She states she was diagnosed with fibromyalgia about 8 to 10 years ago and has chronic pain from that.  Activities of Daily Living:  Patient reports morning stiffness for 5 minutes.   Patient Denies nocturnal pain.  Difficulty dressing/grooming: Denies Difficulty climbing stairs: Reports Difficulty getting out of chair: Reports Difficulty using hands for taps, buttons, cutlery, and/or writing: Reports  Review of Systems  Constitutional: Positive for fatigue. Negative for night sweats, weight gain and weight loss.  HENT: Negative for mouth sores, trouble swallowing, trouble swallowing, mouth dryness and nose dryness.   Eyes: Negative for pain, redness, visual disturbance and dryness.  Respiratory: Negative for cough, shortness of breath and difficulty breathing.   Cardiovascular: Negative for chest pain, palpitations, hypertension, irregular heartbeat and swelling in legs/feet.  Gastrointestinal: Positive for constipation and  diarrhea. Negative for blood in stool.  Endocrine: Negative for increased urination.  Genitourinary: Negative for difficulty urinating, painful urination and vaginal dryness.  Musculoskeletal: Positive for arthralgias, joint pain, joint swelling, muscle weakness, morning stiffness and muscle tenderness. Negative for myalgias and myalgias.  Skin: Negative for color change, rash, hair loss, skin tightness, ulcers and sensitivity to sunlight.  Allergic/Immunologic: Negative for susceptible to infections.  Neurological: Negative for dizziness, numbness, headaches, memory loss, night sweats and weakness.  Hematological: Negative for bruising/bleeding tendency and swollen glands.  Psychiatric/Behavioral: Positive for depressed mood. Negative for sleep disturbance. The patient is nervous/anxious.     PMFS History:  Patient Active Problem List   Diagnosis Date Noted  . Degenerative arthritis of knee, bilateral 07/05/2018  . Protrusion of lumbar intervertebral disc 04/20/2018  . Difficulty urinating 11/24/2017  . Dizziness 08/11/2017  . Sacroiliac pain 06/17/2017  . Greater trochanteric bursitis of right hip 05/27/2017  . Vertigo 04/29/2017  . Dysphagia 10/28/2016  . Lump of skin 05/11/2016  . 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 Chronic meniscal tear of knee 03/05/2016  . Nausea 03/04/2016  . Lightheadedness 12/18/2015  . Osteopenia 12/03/2015  . Thyroid nodule 11/16/2015  . Bilateral leg edema 11/05/2015  . Anterior neck pain 11/05/2015  . Hormone replacement therapy (HRT) 11/05/2015  . Subacromial bursitis 09/16/2015  . 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 Trochanteric bursitis of both hips 03/12/2015  . Subacromial impingement of left shoulder 03/12/2015  . Hyperlipidemia 05/01/2014  . Chronic pain syndrome 05/01/2014  .  Lumbar radiculopathy 03/27/2014  . Left shoulder pain 11/07/2013  . Degeneration of intervertebral disc of cervical region  10/10/2013  . Fibromyalgia 09/06/2013  . Bilateral shoulder pain 09/06/2013  . Low back pain 08/12/2013  . Chronic cough 04/12/2013  . Sinusitis, chronic 04/12/2013  . Hypothyroidism 04/07/2012  . Chronic venous insufficiency 04/02/2010  . Enthesopathy of hip region 07/19/2007  . Osteoporosis 07/19/2007  . Anxiety 01/23/2007  . Depression 01/23/2007  . Migraine 01/23/2007  . NINAR (noninfectious nonallergic rhinitis) 01/23/2007  . Gastroesophageal reflux disease 01/23/2007  . IBS 01/23/2007    Past Medical History:  Diagnosis Date  . ALLERGIC RHINITIS   . Allergy   . Anemia   . ANXIETY   . Arthritis    hands  . Bronchitis   . Chronic fatigue fibromyalgia syndrome   . Complication of anesthesia    says one time waking up she couldn't breathe, felt like her throat closing up  . DEPRESSION   . Enthesopathy of hip region   . Family history of anesthesia complication    sister with n/v  . FOOT PAIN, BILATERAL   . GERD   . Hiatal hernia   . HYPERLIPIDEMIA   . Hypothyroidism   . IBS   . MIGRAINE HEADACHE   . MIGRAINE, COMMON   . NECK MASS   . OSTEOPENIA   . OSTEOPOROSIS   . PLANTAR FASCIITIS   . RASH-NONVESICULAR   . SINUSITIS- ACUTE-NOS   . SKIN LESION   . VAGINITIS   . VENOUS INSUFFICIENCY, CHRONIC     Family History  Problem Relation Age of Onset  . Diabetes Mother   . Heart disease Father   . Diabetes Father   . Diabetes Sister   . Breast cancer Maternal Grandmother   . Heart disease Maternal Uncle        x 2  . Heart disease Maternal Aunt   . Kidney disease Paternal Uncle        questionable  . Heart disease Son   . Healthy Son   . Irritable bowel syndrome Son   . Colon cancer Neg Hx   . Colon polyps Neg Hx   . Rectal cancer Neg Hx   . Stomach cancer Neg Hx    Past Surgical History:  Procedure Laterality Date  . APPENDECTOMY    . BREAST ENHANCEMENT SURGERY    . BREAST IMPLANT REMOVAL    . CHOLECYSTECTOMY    . COLONOSCOPY    . FOOT SURGERY   11/06/2016  . MASTECTOMY     bilateral for severe bilateral fibrocystic disease  . OOPHORECTOMY    . s/p neck lump removal    . SHOULDER SURGERY    . SINUS ENDO W/FUSION Right 06/30/2013   Procedure: RIGHT ENDOSCOPIC SPHENOIDECTOMY WITH FUSION SCAN;  Surgeon: [name], MD;  Location: [facility];  Service: ENT;  Laterality: Right;  . TONSILLECTOMY    . VAGINAL HYSTERECTOMY     Social History   Social History Narrative   Has 2 biological children and 1 step child   Immunization History  Administered Date(s) Administered  . Influenza Split 04/07/2011, 03/08/2012  . Influenza Whole 03/20/2008, 04/01/2009, 04/02/2010  . Influenza, High Dose Seasonal PF 04/12/2013, 03/26/2015, 03/04/2016, 04/01/2017  . Influenza,inj,Quad PF,6+ Mos 05/01/2014  . Pneumococcal Conjugate-13 05/02/2013  . Pneumococcal Polysaccharide-23 01/25/2006  . Td 09/17/2008  . Zoster 01/25/2006  . Zoster Recombinat (Shingrix) 08/18/2017, 12/06/2017     Objective: Vital Signs: BP (!) 143/78 (BP Location: Right  Arm, Patient Position: Sitting, Cuff Size: Normal)   Pulse 86   Resp 13   Ht 5' (1.524 m)   Wt 131 lb (59.4 kg)   BMI 25.58 kg/m    Physical Exam Vitals signs and nursing note reviewed.  Constitutional:      Appearance: She is well-developed.  HENT:     Head: Normocephalic and atraumatic.  Eyes:     Conjunctiva/sclera: Conjunctivae normal.  Neck:     Musculoskeletal: Normal range of motion.  Cardiovascular:     Rate and Rhythm: Normal rate and regular rhythm.     Heart sounds: Normal heart sounds.  Pulmonary:     Effort: Pulmonary effort is normal.     Breath sounds: Normal breath sounds.  Abdominal:     General: Bowel sounds are normal.     Palpations: Abdomen is soft.  Lymphadenopathy:     Cervical: No cervical adenopathy.  Skin:    General: Skin is warm and dry.     Capillary Refill: Capillary refill takes less than 2 seconds.  Neurological:     Mental Status: She is alert and  oriented to person, place, and time.  Psychiatric:        Behavior: Behavior normal.      Musculoskeletal Exam: She has limited range of motion of the cervical and lumbar spine with discomfort.  Shoulder joints elbow joints wrist joints were in good range of motion.  She has posttraumatic changes in her left elbow.  PIP DIP and CMC thickening was noted in her hands without any synovitis.  Hip joints and knee joints with good range of motion.  Ankle joints MTPs PIPs been good range of motion with no synovitis.  She has some tenderness over bilateral trochanteric bursa.  CDAI Exam: CDAI Score: Not documented Patient Global Assessment: Not documented; Provider Global Assessment: Not documented Swollen: Not documented; Tender: Not documented Joint Exam   Not documented   There is currently no information documented on the homunculus. Go to the Rheumatology activity and complete the homunculus joint exam.  Investigation: Findings:  04/01/17: ANA negative, RF<14, uric acid 3.1, sed rate 59, TSH 0.55  Component     Latest Ref Rng & Units 04/01/2017  Iron     42 - 145 ug/dL 38 (L)  Transferrin     212.0 - 360.0 mg/dL 216.0  Saturation Ratios     20.0 - 50.0 % 12.6 (L)  PTH, Intact     14 - 64 pg/mL 39  Calcium     8.6 - 10.4 mg/dL 9.0  Sed Rate     0 - 30 mm/hr 59 (H)  TSH     0.35 - 4.50 uIU/mL 0.55  Anti Nuclear Antibody(ANA)     NEGATIVE NEGATIVE  RA Latex Turbid.     <14 IU/mL <14  Calcium Ionized     4.8 - 5.6 mg/dL 5.2  Uric Acid, Serum     2.4 - 7.0 mg/dL 3.1   Imaging: Xr Hand 2 View Left  Result Date: 08/09/2018 Severe CMC PIP and DIP narrowing was noted.  No MCP, intercarpal or radiocarpal narrowing was noted.  Some cystic changes were noted in the carpal bones. Impression: These findings are consistent with osteoarthritis of the hand.  Xr Hand 2 View Right  Result Date: 08/09/2018 CMC PIP and DIP narrowing was noted.  Third MCP joint narrowing was noted.  No  intercarpal or radiocarpal joint space narrowing was noted.  Some cystic changes been noted  in the carpal bones. Impression: These findings are consistent with osteoarthritis of the hand.  Vas US Carotid  Result Date: 07/15/2018 Carotid Arterial Duplex Study Indications:  Patient presents today with complaints of intermittent dizziness.               She states she has a history of migraines and syncope episodes.               She denies any other cerebrovascular symptoms. Risk Factors: Hyperlipidemia, no history of smoking. Performing Technologist: Sharlett Iles RVT  Examination Guidelines: A complete evaluation includes B-mode imaging, spectral Doppler, color Doppler, and power Doppler as needed of all accessible portions of each vessel. Bilateral testing is considered an integral part of a complete examination. Limited examinations for reoccurring indications may be performed as noted.  Right Carotid Findings: +----------+--------+--------+--------+--------------------+-------------------+           PSV cm/sEDV cm/sStenosisDescribe            Comments            +----------+--------+--------+--------+--------------------+-------------------+ CCA Prox  80      10                                  intimal thickening                                                        and tortuous        +----------+--------+--------+--------+--------------------+-------------------+ CCA Distal67      16                                  intimal thickening  +----------+--------+--------+--------+--------------------+-------------------+ ICA Prox  55      17              heterogenous and                                                          irregular                               +----------+--------+--------+--------+--------------------+-------------------+ ICA Mid   81      27      1-39%                       tortuous             +----------+--------+--------+--------+--------------------+-------------------+ ICA Distal88      26                                  tortuous            +----------+--------+--------+--------+--------------------+-------------------+ ECA       77      9  intimal thickening  +----------+--------+--------+--------+--------------------+-------------------+ +----------+--------+-------+----------------+-------------------+           PSV cm/sEDV cmsDescribe        Arm Pressure (mmHG) +----------+--------+-------+----------------+-------------------+ [id number]             Multiphasic, [id number]                 +----------+--------+-------+----------------+-------------------+ +---------+--------+--+--------+--+---------+ VertebralPSV cm/s80EDV cm/s12Antegrade +---------+--------+--+--------+--+---------+  Left Carotid Findings: +----------+-------+-------+--------+------------------------+-----------------+           PSV    EDV    StenosisDescribe                Comments                    cm/s   cm/s                                                     +----------+-------+-------+--------+------------------------+-----------------+ CCA Prox  80     10                                     tortuous          +----------+-------+-------+--------+------------------------+-----------------+ CCA Distal48     11                                     intimal                                                                   thickening        +----------+-------+-------+--------+------------------------+-----------------+ ICA Prox  54     8      1-39%   heterogenous and        tortuous                                          irregular                                 +----------+-------+-------+--------+------------------------+-----------------+ ICA Mid   53     20                                     tortuous           +----------+-------+-------+--------+------------------------+-----------------+ ICA Distal81     27                                     tortuous          +----------+-------+-------+--------+------------------------+-----------------+ ECA       41     10             heterogenous and  irregular                                 +----------+-------+-------+--------+------------------------+-----------------+ +----------+--------+--------+----------------+-------------------+ SubclavianPSV cm/sEDV cm/sDescribe        Arm Pressure (mmHG) +----------+--------+--------+----------------+-------------------+           73              Multiphasic, [id number]                 +----------+--------+--------+----------------+-------------------+ +---------+--------+--+--------+--+---------+ VertebralPSV cm/s59EDV cm/s12Antegrade +---------+--------+--+--------+--+---------+  Summary: Right Carotid: Velocities in the right ICA are consistent with a 1-39% stenosis. Left Carotid: Velocities in the left ICA are consistent with a 1-39% stenosis. Vertebrals:  Bilateral vertebral arteries demonstrate antegrade flow.  Subclavians: Normal flow hemodynamics were seen in bilateral subclavian              arteries. *See table(s) above for measurements and observations.  Electronically signed by Larae Grooms MD on 07/15/2018 at 7:11:48 PM.    Final     Recent Labs: Lab Results  Component Value Date   WBC 8.7 03/15/2018   HGB 13.6 03/15/2018   PLT 303.0 03/15/2018   NA 128 (L) 03/15/2018   K 3.6 03/15/2018   CL 94 (L) 03/15/2018   CO2 28 03/15/2018   GLUCOSE 106 (H) 03/15/2018   BUN 12 03/15/2018   CREATININE 0.74 03/15/2018   BILITOT 0.3 03/15/2018   ALKPHOS 43 03/15/2018   AST 18 03/15/2018   ALT 16 03/15/2018   PROT 8.0 03/15/2018   ALBUMIN 4.5 03/15/2018   CALCIUM 10.2 03/15/2018   GFRAA >60 04/23/2017    Speciality  Comments: No specialty comments available.  Procedures:  No procedures performed Allergies: Prednisone; Amoxicillin-pot clavulanate; Aspirin; Erythromycin; Levofloxacin; Nsaids; Statins; Sumatriptan; Adhesive [tape]; Ivp dye [iodinated diagnostic agents]; Methylphenidate hcl; Mirtazapine; Red yeast rice [cholestin]; Shellfish allergy; Soy allergy; Doxycycline; Hydrocodone; Hydrocodone-acetaminophen; Latex; Rofecoxib; Sertraline hcl; and Sulfonamide derivatives   Assessment / Plan:     Visit Diagnoses: Polyarthralgia -patient complains of pain in multiple joints.  No synovitis was noted on examination.  I reviewed x-ray of her bilateral knee joints from February 2019 which showed mild osteoarthritis.  04/01/17: ANA negative, RF<14, uric acid 3.1, TSH 0.55  Pain in both hands -her main concern is pain in bilateral hands and bilateral wrists.  She has DIP PIP and CMC thickening.  No synovitis was noted.  All autoimmune work-up in the past has been negative.  Plan: XR Hand 2 View Right, XR Hand 2 View Left.  X-ray of bilateral hands were consistent with osteoarthritis and some cystic changes were noted as well.  She has history of intermittent swelling all schedule ultrasound of her bilateral hands.  Fibromyalgia-she continues to have generalized pain and discomfort from fibromyalgia.  She also has hypermobility which could be contributing to arthralgias.  Chronic pain syndrome  Spondylosis 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 myelopathy or radiculopathy-patient states she has severe lower back pain for multiple months.  She had some relief from recent epidural injection.  Degeneration of intervertebral disc of cervical region-she has a stiffness and discomfort in her cervical region.  Trochanteric bursitis of both hips-she has pain in her bilateral trochanteric area.  Subacromial impingement of left shoulder-she had fairly good range of motion without much discomfort.  Old tear of medial meniscus of left  knee, unspecified tear type  Osteopenia of multiple sites-I reviewed her bone density and advised calcium vitamin D and  resistive exercises.  Other medical problems are listed as follows:  Thyroid nodule  Hypothyroidism, unspecified type  Chronic venous insufficiency  History of IBS  History of gastroesophageal reflux (GERD)  Hormone replacement therapy (HRT)  History of hyperlipidemia  Anxiety and depression   Orders: Orders Placed This Encounter  Procedures  . XR Hand 2 View Right  . XR Hand 2 View Left  . Cyclic citrul peptide antibody, IgG  . 14-3-3 eta Protein  . Rheumatoid factor  . Uric acid   No orders of the defined types were placed in this encounter.   Face-to-face time spent with patient was 60 minutes. Greater than 50% of time was spent in counseling and coordination of care.  Follow-Up Instructions: Return for Osteoarthritis.   Bo Merino, MD  Note - This record has been created using Editor, commissioning.  Chart creation errors have been sought, but may not always  have been located. Such creation errors do not reflect on  the standard of medical care.

## 2018-08-09 ENCOUNTER — Ambulatory Visit: Payer: Medicare Other | Admitting: Rheumatology

## 2018-08-09 ENCOUNTER — Ambulatory Visit (INDEPENDENT_AMBULATORY_CARE_PROVIDER_SITE_OTHER): Payer: Medicare Other

## 2018-08-09 ENCOUNTER — Encounter: Payer: Self-pay | Admitting: Rheumatology

## 2018-08-09 ENCOUNTER — Ambulatory Visit (INDEPENDENT_AMBULATORY_CARE_PROVIDER_SITE_OTHER): Payer: Self-pay

## 2018-08-09 VITALS — BP 143/78 | HR 86 | Resp 13 | Ht 60.0 in | Wt 131.0 lb

## 2018-08-09 DIAGNOSIS — Z8719 Personal history of other diseases of the digestive system: Secondary | ICD-10-CM

## 2018-08-09 DIAGNOSIS — F32A Depression, unspecified: Secondary | ICD-10-CM

## 2018-08-09 DIAGNOSIS — M79641 Pain in right hand: Secondary | ICD-10-CM

## 2018-08-09 DIAGNOSIS — Z8639 Personal history of other endocrine, nutritional and metabolic disease: Secondary | ICD-10-CM

## 2018-08-09 DIAGNOSIS — G894 Chronic pain syndrome: Secondary | ICD-10-CM

## 2018-08-09 DIAGNOSIS — I872 Venous insufficiency (chronic) (peripheral): Secondary | ICD-10-CM

## 2018-08-09 DIAGNOSIS — M2550 Pain in unspecified joint: Secondary | ICD-10-CM | POA: Diagnosis not present

## 2018-08-09 DIAGNOSIS — M7061 Trochanteric bursitis, right hip: Secondary | ICD-10-CM

## 2018-08-09 DIAGNOSIS — M8589 Other specified disorders of bone density and structure, multiple sites: Secondary | ICD-10-CM

## 2018-08-09 DIAGNOSIS — F419 Anxiety disorder, unspecified: Secondary | ICD-10-CM

## 2018-08-09 DIAGNOSIS — M7062 Trochanteric bursitis, left hip: Secondary | ICD-10-CM

## 2018-08-09 DIAGNOSIS — M797 Fibromyalgia: Secondary | ICD-10-CM

## 2018-08-09 DIAGNOSIS — M7542 Impingement syndrome of left shoulder: Secondary | ICD-10-CM

## 2018-08-09 DIAGNOSIS — M79642 Pain in left hand: Secondary | ICD-10-CM

## 2018-08-09 DIAGNOSIS — M47816 Spondylosis without myelopathy or radiculopathy, lumbar region: Secondary | ICD-10-CM | POA: Diagnosis not present

## 2018-08-09 DIAGNOSIS — E041 Nontoxic single thyroid nodule: Secondary | ICD-10-CM

## 2018-08-09 DIAGNOSIS — M23204 Derangement of unspecified medial meniscus due to old tear or injury, left knee: Secondary | ICD-10-CM

## 2018-08-09 DIAGNOSIS — Z79890 Hormone replacement therapy (postmenopausal): Secondary | ICD-10-CM

## 2018-08-09 DIAGNOSIS — E039 Hypothyroidism, unspecified: Secondary | ICD-10-CM

## 2018-08-09 DIAGNOSIS — F329 Major depressive disorder, single episode, unspecified: Secondary | ICD-10-CM

## 2018-08-09 DIAGNOSIS — M5030 Other cervical disc degeneration, unspecified cervical region: Secondary | ICD-10-CM

## 2018-08-11 DIAGNOSIS — Z79891 Long term (current) use of opiate analgesic: Secondary | ICD-10-CM | POA: Diagnosis not present

## 2018-08-11 DIAGNOSIS — G894 Chronic pain syndrome: Secondary | ICD-10-CM | POA: Diagnosis not present

## 2018-08-11 DIAGNOSIS — M47816 Spondylosis without myelopathy or radiculopathy, lumbar region: Secondary | ICD-10-CM | POA: Diagnosis not present

## 2018-08-11 DIAGNOSIS — M5416 Radiculopathy, lumbar region: Secondary | ICD-10-CM | POA: Diagnosis not present

## 2018-08-11 DIAGNOSIS — M47812 Spondylosis without myelopathy or radiculopathy, cervical region: Secondary | ICD-10-CM | POA: Diagnosis not present

## 2018-08-13 LAB — CYCLIC CITRUL PEPTIDE ANTIBODY, IGG: Cyclic Citrullin Peptide Ab: <16 UNITS

## 2018-08-13 LAB — SEDIMENTATION RATE: Sed Rate: 36 mm/h — ABNORMAL HIGH (ref 0–30)

## 2018-08-13 LAB — RHEUMATOID FACTOR: Rheumatoid fact SerPl-aCnc: <14 IU/mL (ref <14)

## 2018-08-13 LAB — URIC ACID: Uric Acid, Serum: 2.4 mg/dL — ABNORMAL LOW (ref 2.5–7.0)

## 2018-08-13 LAB — 14-3-3 ETA PROTEIN: 14-3-3 eta Protein: <0.2 ng/mL (ref <0.2)

## 2018-08-15 ENCOUNTER — Encounter: Payer: Self-pay | Admitting: *Deleted

## 2018-08-15 NOTE — Progress Notes (Signed)
Will discuss at the fu visit

## 2018-08-16 ENCOUNTER — Ambulatory Visit: Payer: Medicare Other | Admitting: Family Medicine

## 2018-08-16 ENCOUNTER — Other Ambulatory Visit: Payer: Self-pay

## 2018-08-16 VITALS — BP 108/78 | HR 92 | Ht 60.0 in | Wt 132.0 lb

## 2018-08-16 DIAGNOSIS — M47816 Spondylosis without myelopathy or radiculopathy, lumbar region: Secondary | ICD-10-CM | POA: Diagnosis not present

## 2018-08-16 DIAGNOSIS — I872 Venous insufficiency (chronic) (peripheral): Secondary | ICD-10-CM | POA: Diagnosis not present

## 2018-08-16 DIAGNOSIS — M170 Bilateral primary osteoarthritis of knee: Secondary | ICD-10-CM | POA: Diagnosis not present

## 2018-08-16 NOTE — Patient Instructions (Signed)
Good to see you  Keep indoors or away when you can  Keep doing what you are doing  See me again in 4 months

## 2018-08-16 NOTE — Progress Notes (Signed)
[facility] [address], [place] [address] Phone: [phone] Subjective:     CC: Polyarthralgia follow-up  [id number]  Laura Barnes is a 78 y.o. female coming in with complaint of bilateral knee pain. Injections from last visit improved her pain.   Her back is sore today. Also notes a stiffness. Pain is intermittent and day to day.  Patient feels it is weather related, not as bad as symptoms.  Does not feel like she needs any type of injection at the moment.  Would like to discuss recent labs that were drawn by her rheumatologist.  Patient had a negative anti-CCP as well as uric acid within normal limits.      Past Medical History:  Diagnosis Date  . ALLERGIC RHINITIS   . Allergy   . Anemia   . ANXIETY   . Arthritis    hands  . Bronchitis   . Chronic fatigue fibromyalgia syndrome   . Complication of anesthesia    says one time waking up she couldn't breathe, felt like her throat closing up  . DEPRESSION   . Enthesopathy of hip region   . Family history of anesthesia complication    sister with n/v  . FOOT PAIN, BILATERAL   . GERD   . Hiatal hernia   . HYPERLIPIDEMIA   . Hypothyroidism   . IBS   . MIGRAINE HEADACHE   . MIGRAINE, COMMON   . NECK MASS   . OSTEOPENIA   . OSTEOPOROSIS   . PLANTAR FASCIITIS   . RASH-NONVESICULAR   . SINUSITIS- ACUTE-NOS   . SKIN LESION   . VAGINITIS   . VENOUS INSUFFICIENCY, CHRONIC    Past Surgical History:  Procedure Laterality Date  . APPENDECTOMY    . BREAST ENHANCEMENT SURGERY    . BREAST IMPLANT REMOVAL    . CHOLECYSTECTOMY    . COLONOSCOPY    . FOOT SURGERY  11/06/2016  . MASTECTOMY     bilateral for severe bilateral fibrocystic disease  . OOPHORECTOMY    . s/p neck lump removal    . SHOULDER SURGERY    . SINUS ENDO W/FUSION Right 06/30/2013   Procedure: RIGHT ENDOSCOPIC SPHENOIDECTOMY WITH FUSION SCAN;  Surgeon: [name], MD;  Location: [facility];  Service: ENT;   Laterality: Right;  . TONSILLECTOMY    . VAGINAL HYSTERECTOMY     Social History   Socioeconomic History  . Marital status: Widowed    Spouse name: Not on file  . Number of children: 2  . Years of education: Not on file  . Highest education level: Not on file  Occupational History  . Occupation: retired Water quality scientist  . Financial resource strain: Not hard at all  . Food insecurity:    Worry: Never true    Inability: Never true  . Transportation needs:    Medical: No    Non-medical: No  Tobacco Use  . Smoking status: Never Smoker  . Smokeless tobacco: Never Used  Substance and Sexual Activity  . Alcohol use: No  . Drug use: No  . Sexual activity: Not Currently  Lifestyle  . Physical activity:    Days per week: 0 days    Minutes per session: 0 min  . Stress: Not at all  Relationships  . Social connections:    Talks on phone: More than three times a week    Gets together: More than three times a week  Attends religious service: More than 4 times per year    Active member of club or organization: Not on file    Attends meetings of clubs or organizations: More than 4 times per year    Relationship status: Widowed  Other Topics Concern  . Not on file  Social History Narrative   Has 2 biological children and 1 step child   Allergies  Allergen Reactions  . Prednisone Anaphylaxis    Patient unable to remember why she needed to steroid shot but just knows that when she got back to work she started having a lot of trouble breathing.  (jkl 05/04/14)  Multiple epidural steroid injections with depomedrol with no issues. (05/04/18)  . Amoxicillin-Pot Clavulanate Hives and Diarrhea    Has patient had a PCN reaction causing immediate rash, facial/tongue/throat swelling, SOB or lightheadedness with hypotension: Unknown Has patient had a PCN reaction causing severe rash involving mucus membranes or skin necrosis: Unknown Has patient had a PCN reaction that required  hospitalization: Unknown Has patient had a PCN reaction occurring within the last 10 years: Unknown If all of the above answers are "NO", then may proceed with Cephalosporin use.   . Aspirin Other (See Comments)    Stomach ache and bleed  . Erythromycin Diarrhea  . Levofloxacin Other (See Comments)    Headaches, GI upset  . Nsaids Other (See Comments)    stomach bleeding per pt  . Statins Nausea Only and Other (See Comments)    Headache, upset stomach, joint hurt, heartburn  . Sumatriptan Hives and Palpitations  . Adhesive [Tape] Other (See Comments)    blisters  . Ivp Dye [Iodinated Diagnostic Agents]     If made with blue shellfish then CAN NOT have this type of dye.  Pt was given contrast 10/12/17, not premedicated, had NO COMPLICATIONS- BLO, CT  . Methylphenidate Hcl     Unknown reaction  . Mirtazapine     Unknown reaction  . Red Yeast Rice [Cholestin] Other (See Comments)    Pt reports causes aching pains like statins do  . Shellfish Allergy     Blue shellfish  . Soy Allergy     Unknown reaction  . Doxycycline Diarrhea  . Hydrocodone Itching  . Hydrocodone-Acetaminophen Itching  . Latex Other (See Comments)    blisters  . Rofecoxib Nausea Only  . Sertraline Hcl Nausea Only  . Sulfonamide Derivatives Itching   Family History  Problem Relation Age of Onset  . Diabetes Mother   . Heart disease Father   . Diabetes Father   . Diabetes Sister   . Breast cancer Maternal Grandmother   . Heart disease Maternal Uncle        x 2  . Heart disease Maternal Aunt   . Kidney disease Paternal Uncle        questionable  . Heart disease Son   . Healthy Son   . Irritable bowel syndrome Son   . Colon cancer Neg Hx   . Colon polyps Neg Hx   . Rectal cancer Neg Hx   . Stomach cancer Neg Hx     Current Outpatient Medications (Endocrine & Metabolic):  .  estradiol (ESTRACE) 0.5 MG tablet, Take 1 tablet (0.5 mg total) by mouth every other day. .  estradiol (ESTRACE) 1 MG  tablet, TAKE 1/2 TABLET BY MOUTH EVERY DAY .  levothyroxine (SYNTHROID, LEVOTHROID) 75 MCG tablet, TAKE 1 TABLET BY MOUTH EVERY DAY IN THE MORNING  Current Outpatient Medications (Cardiovascular):  .  triamterene-hydrochlorothiazide (MAXZIDE-25) 37.5-25 MG tablet, Take 1 tablet by mouth daily.  Current Outpatient Medications (Respiratory):  .  benzonatate (TESSALON) 200 MG capsule, TAKE 1 CAPSULE(200 MG) BY MOUTH THREE TIMES DAILY AS NEEDED FOR COUGH .  cetirizine (ZYRTEC) 10 MG tablet, TAKE 1 TABLET (10 MG TOTAL) BY MOUTH DAILY.  Current Outpatient Medications (Analgesics):  .  acetaminophen (TYLENOL) 500 MG tablet, Take 1,000 mg by mouth every 6 (six) hours as needed for mild pain, moderate pain, fever or headache.   Current Outpatient Medications (Other):  Marland Kitchen  5-Hydroxytryptophan (5-HTP PO), Take 1 capsule by mouth 2 (two) times daily. Marland Kitchen  ALPRAZolam (XANAX) 0.5 MG tablet, TAKE 1 TABLET BY MOUTH THREE TIMES DAILY AS NEEDED FOR ANXIETY .  AMBULATORY NON FORMULARY MEDICATION, Walker .  AMBULATORY NON FORMULARY MEDICATION, Adjustable bed .  Amino Acids (AMINO ACID PO), Take 1 capsule by mouth 2 (two) times daily. .  Ascorbic Acid (VITAMIN C) 1000 MG tablet, Take 1,000 mg by mouth 2 (two) times daily. .  B Complex-Biotin-FA (B COMPLETE) TABS, Take 1 tablet by mouth daily. B Complete 100mg  .  buPROPion (WELLBUTRIN XL) 150 MG 24 hr tablet, TAKE 1 TABLET(150 MG) BY MOUTH EVERY MORNING .  Cholecalciferol (VITAMIN D3) 1000 units CAPS, Take 1,000 Units by mouth daily. .  Coenzyme Q10 (CO Q-10) 200 MG CAPS, Take 1 capsule by mouth 2 (two) times daily. Marland Kitchen  ECHINACEA PO, Take 1 tablet by mouth 2 (two) times daily. Marland Kitchen  GOLDEN SEAL PO, Take by mouth daily. Marland Kitchen  LECITHIN PO, Take 1 tablet by mouth daily. Marland Kitchen  MAGNESIUM ASPARTATE PO, Take 1 tablet by mouth 2 (two) times daily. .  meclizine (ANTIVERT) 25 MG tablet, Take 1 tablet (25 mg total) by mouth 3 (three) times daily as needed for dizziness. .  Misc.  Devices (ROLLATOR) MISC, Use rollator for ambulation .  Multiple Vitamin (MULTIVITAMIN WITH MINERALS) TABS tablet, Take 1 tablet by mouth daily. .  Multiple Vitamins-Minerals (ZINC PO), Take by mouth daily. .  Omega-3 Fatty Acids (SALMON OIL-1000 PO), Take 1,000 mg by mouth daily. .  ondansetron (ZOFRAN) 4 MG tablet, TAKE 1 TABLET(4 MG) BY MOUTH EVERY 8 HOURS AS NEEDED FOR NAUSEA OR VOMITING .  ondansetron (ZOFRAN-ODT) 8 MG disintegrating tablet, Take 1 tablet (8 mg total) by mouth every 8 (eight) hours as needed for nausea or vomiting. Marland Kitchen  POTASSIUM AMINOBENZOATE PO, Take 1 tablet by mouth daily. .  TURMERIC PO, Take 1 tablet by mouth 2 (two) times daily.  .  vitamin E 400 UNIT capsule, Take 400 Units by mouth daily.    Past medical history, social, surgical and family history all reviewed in electronic medical record.  No pertanent information unless stated regarding to the chief complaint.   Review of Systems:  No  visual changes, nausea, vomiting, diarrhea, constipation, dizziness, abdominal pain, skin rash, fevers, chills, night sweats, weight loss, swollen lymph nodes,  chest pain, shortness of breath, mood changes.  Other muscle aches, joint swelling, body aches, headaches  Objective  Blood pressure 108/78, pulse 92, height 5' (1.524 m), weight 132 lb (59.9 kg), SpO2 99 %.    General: No apparent distress alert and oriented x3 mood and affect normal, dressed appropriately.  HEENT: Pupils equal, extraocular movements intact  Respiratory: Patient's speak in full sentences and does not appear short of breath  Cardiovascular: Trace lower extremity edema, non tender, no erythema  Skin: Warm dry intact with no signs of infection or rash on extremities  or on axial skeleton.  Abdomen: Soft nontender  Neuro: Cranial nerves II through XII are intact, neurovascularly intact in all extremities with 2+ DTRs and 2+ pulses.  Lymph: No lymphadenopathy of posterior or anterior cervical chain or  axillae bilaterally.  Gait antalgic MSK:  tender with limited range of motion and good stability and symmetric strength and tone of shoulders, elbows, wrist, hip, and ankles bilaterally.  Patient back exam has some loss of lordosis with some mild degenerative scoliosis.  Tightness with straight leg test but no radicular symptoms.  Patient's pain to palpation is out of proportion. Knee: valgus deformity noted. Large thigh to calf ratio.  Tender to palpation over medial and PF joint line.  ROM full in flexion and extension and lower leg rotation. instability with valgus force.  painful patellar compression. Patellar glide with moderate crepitus. Patellar and quadriceps tendons unremarkable. Hamstring and quadriceps strength is normal. Contralateral knee shows    Impression and Recommendations:     This case required medical decision making of moderate complexity. The above documentation has been reviewed and is accurate and complete Lyndal Pulley, DO       Note: This dictation was prepared with Dragon dictation along with smaller phrase technology. Any transcriptional errors that result from this process are unintentional.

## 2018-08-16 NOTE — Assessment & Plan Note (Signed)
I believe some of the swelling is secondary to the chronic venous insufficiency and we discussed compression again.

## 2018-08-16 NOTE — Assessment & Plan Note (Signed)
Known arthritic changes.  Hold on any vascular or viscosupplementation.  Nothing patient will do fairly well with conservative therapy.  Discussed home exercise, icing regimen, which activities doing which wants to avoid.  Patient is to increase activity slowly over the course the next several days.  Follow-up with me again in 8 weeks.  Spent  25 minutes with patient face-to-face and had greater than 50% of counseling including as described above in assessment and plan.

## 2018-08-16 NOTE — Assessment & Plan Note (Signed)
Stable at moment.  No further work-up at this point.  Patient encouraged to stay away at this point as long as feeling well secondary to coronavirus follow-up again in 3 to 4 months

## 2018-08-22 ENCOUNTER — Ambulatory Visit: Payer: Medicare Other | Admitting: Rheumatology

## 2018-08-31 NOTE — Progress Notes (Signed)
August 09, 2018 ESR 36, uric acid 2.4, RF negative, anti-CCP negative, _0 eta negative.  Other medical problems are listed as follows:  Thyroid nodule  Hypothyroidism, unspecified type  Chronic venous insufficiency  History of IBS  History of gastroesophageal reflux (GERD)  Hormone replacement therapy (HRT)  History of hyperlipidemia  Anxiety and depression

## 2018-09-07 ENCOUNTER — Encounter: Payer: Self-pay | Admitting: Rheumatology

## 2018-09-07 ENCOUNTER — Telehealth (INDEPENDENT_AMBULATORY_CARE_PROVIDER_SITE_OTHER): Payer: Medicare Other | Admitting: Rheumatology

## 2018-09-07 DIAGNOSIS — M7062 Trochanteric bursitis, left hip: Secondary | ICD-10-CM

## 2018-09-07 DIAGNOSIS — M19042 Primary osteoarthritis, left hand: Secondary | ICD-10-CM | POA: Diagnosis not present

## 2018-09-07 DIAGNOSIS — G894 Chronic pain syndrome: Secondary | ICD-10-CM

## 2018-09-07 DIAGNOSIS — M7061 Trochanteric bursitis, right hip: Secondary | ICD-10-CM

## 2018-09-07 DIAGNOSIS — M797 Fibromyalgia: Secondary | ICD-10-CM

## 2018-09-07 DIAGNOSIS — M19041 Primary osteoarthritis, right hand: Secondary | ICD-10-CM | POA: Diagnosis not present

## 2018-09-07 DIAGNOSIS — M8589 Other specified disorders of bone density and structure, multiple sites: Secondary | ICD-10-CM

## 2018-09-07 DIAGNOSIS — M5136 Other intervertebral disc degeneration, lumbar region: Secondary | ICD-10-CM

## 2018-09-07 DIAGNOSIS — M5030 Other cervical disc degeneration, unspecified cervical region: Secondary | ICD-10-CM

## 2018-09-07 NOTE — Progress Notes (Signed)
Virtual Visit via Telephone Note  I connected with Laura Barnes on 09/07/18 at  8:45 AM EDT by telephone and verified that I am speaking with the correct person using two identifiers.   I discussed the limitations, risks, security and privacy concerns of performing an evaluation and management service by telephone and the availability of in person appointments. I also discussed with the patient that there may be a patient responsible charge related to this service. The patient expressed understanding and agreed to proceed.  CC: Pain in both hands  History of Present Illness: Patient is a 78 year old female with a past medical history of osteoarthritis, DDD, and fibromyalgia.  She has occasional bilateral knee joint pain.  She follows up with Dr. Tamala Julian for injections when the pain is severe.  She has chronic lower back pain, and she plans on following up with Dr. Ernestina Patches for a repeat epidural injection once he is performing elective procedures again after COVID-19 has died down. She denies any neck pain. She has occasional pain and intermittent joint swelling in both hands.     Review of Systems  Constitutional: Negative for fever and malaise/fatigue.  Eyes: Negative for photophobia, pain, discharge and redness.  Respiratory: Negative for cough, shortness of breath and wheezing.   Cardiovascular: Negative for chest pain and palpitations.  Gastrointestinal: Negative for blood in stool, constipation and diarrhea.  Genitourinary: Negative for dysuria.  Musculoskeletal: Positive for back pain and joint pain. Negative for myalgias and neck pain.  Skin: Negative for rash.  Neurological: Negative for dizziness and headaches.  Psychiatric/Behavioral: Positive for depression (Wellbutrin). The patient has insomnia (Melatonin). The patient is not nervous/anxious.      Observations/Objective: Physical Exam  Constitutional: She is oriented to person, place, and time.  Neurological: She is alert  and oriented to person, place, and time.  Psychiatric: Mood, memory, affect and judgment normal.   Patient reports morning stiffness for 0 minutes.   Patient denies nocturnal pain.  Difficulty dressing/grooming: Denies Difficulty climbing stairs: Reports Difficulty getting out of chair: Reports Difficulty using hands for taps, buttons, cutlery, and/or writing: Denies  Findings:  August 09, 2018 ESR 36, uric acid 2.4, RF negative, anti-CCP negative, 14 3 3  eta negative.  Assessment and Plan: Primary osteoarthritis of both hands: August 09, 2018 ESR 36, uric acid 2.4, RF negative, anti-CCP negative, 14 3 3  eta negative.  Clinical and radiographic findings are consistent with osteoarthritis.  Lab work and X-rays were reviewed with the patient and all questions were addressed. She has intermittent pain and swelling in both hands. She has complete fist formation bilaterally.  We will schedule an ultrasound of both hands to assess for synovitis.   She takes tumeric and omega 3.  She uses ginger oil and arnica cream topically.  She uses heated gloves if her hands are causing increased discomfort.    DDD, cervical: She has no neck pain or stiffness at this time.  She has no symptoms of radiculopathy at this time.  DDD, lumbar: She has chronic lower back pain.  She has had epidural injections in the past performed by Dr. Ernestina Patches.  She is planning on following up with him for a repeat injection once he is scheduling elective procedures again.  Fibromyalgia: She continues to have generalized muscle aches or muscle tenderness.  She takes melatonin at bedtime for insomnia.  Good sleep hygiene was discussed. She experiences days of significant fatigue.  She was encouraged to stay active and exercise on  a regular basis.    Trochanteric bursitis of both hips: She has intermittent symptoms of trochanteric bursitis. She performs stretching exercises.   Chronic pain syndrome: She takes tylenol PRN for pain  relief.  Osteopenia of multiple sites: She is taking a vitamin D supplement.   Follow Up Instructions: She will follow up in 6 months. We will schedule an ultrasound of both hands.     I discussed the assessment and treatment plan with the patient. The patient was provided an opportunity to ask questions and all were answered. The patient agreed with the plan and demonstrated an understanding of the instructions.   The patient was advised to call back or seek an in-person evaluation if the symptoms worsen or if the condition fails to improve as anticipated.  I provided 25 minutes of non-face-to-face time during this encounter. Bo Merino, MD  Scribed by-  Ofilia Neas, PA-C

## 2018-09-07 NOTE — Telephone Encounter (Signed)
error 

## 2018-09-13 NOTE — Progress Notes (Signed)
Virtual Visit via Video Note  I connected with Laura Barnes on 09/13/18 at  1:30 PM EDT by a video enabled telemedicine application and verified that I am speaking with the correct person using two identifiers.   I discussed the limitations of evaluation and management by telemedicine and the availability of in person appointments. The patient expressed understanding and agreed to proceed.  The patient is currently at home and I am in the office.    No referring provider.    History of Present Illness: She is here for follow up of her chronic medical conditions.  She is not exercising regularly.  Hypothyroidism:  She is taking her medication daily.  She denies any recent changes in weight that are unexplained. Her energy level is very low due to fibromyalgia-she is unsure if this her thyroid or not.    Fibromyalgia:  Her fibromyalgia has flared up and she is in a lot of pain.  She has no energy and feels it is likely from the fibromyalgia.  She is still taking and doing all of her natural remedies, which is helped some.  Leg swelling:  She has had increased leg swelling and sometimes her hands.  She is taking maxide daily and feels it is not working for the swelling.  She is compliant with a low sodium diet.  She elevates her legs when sitting.    HRT:  She is taking estrace every other day.  On occasion she will get a mild hot flash.  Anxiety, Depression: She is taking her wellbutrin and xanax daily as prescribed. She denies any side effects from the medication. She feels her anxiety and depression are well controlled and she is happy with her current dose of medication.  The past couple of days the bottom and top of her feet have felt sunburn.  She looked it up and felt this could be a circulatory problem.  Last night her lower back also felt out of whack and she tried to push her lower back up against the corner of the door frame and that seemed to help.  She is thinking about  seeing her chiropractor for both.  Review of Systems  Constitutional: Negative for chills and fever.  Respiratory: Positive for cough (Chronic). Negative for shortness of breath and wheezing.   Cardiovascular: Positive for leg swelling. Negative for chest pain and palpitations.  Neurological: Positive for headaches (Occasional).     Social History   Socioeconomic History  . Marital status: Widowed    Spouse name: Not on file  . Number of children: 2  . Years of education: Not on file  . Highest education level: Not on file  Occupational History  . Occupation: retired Water quality scientist  . Financial resource strain: Not hard at all  . Food insecurity:    Worry: Never true    Inability: Never true  . Transportation needs:    Medical: No    Non-medical: No  Tobacco Use  . Smoking status: Never Smoker  . Smokeless tobacco: Never Used  Substance and Sexual Activity  . Alcohol use: No  . Drug use: No  . Sexual activity: Not Currently  Lifestyle  . Physical activity:    Days per week: 0 days    Minutes per session: 0 min  . Stress: Not at all  Relationships  . Social connections:    Talks on phone: More than three times a week    Gets together: More than three times  a week    Attends religious service: More than 4 times per year    Active member of club or organization: Not on file    Attends meetings of clubs or organizations: More than 4 times per year    Relationship status: Widowed  Other Topics Concern  . Not on file  Social History Narrative   Has 2 biological children and 1 step child     Observations/Objective: Appears well in NAD Mood and affect are normal  Assessment and Plan:  See Problem List for Assessment and Plan of chronic medical problems.   Follow Up Instructions:    I discussed the assessment and treatment plan with the patient. The patient was provided an opportunity to ask questions and all were answered. The patient agreed with  the plan and demonstrated an understanding of the instructions.   The patient was advised to call back or seek an in-person evaluation if the symptoms worsen or if the condition fails to improve as anticipated.    Binnie Rail, MD

## 2018-09-14 ENCOUNTER — Ambulatory Visit (INDEPENDENT_AMBULATORY_CARE_PROVIDER_SITE_OTHER): Payer: Medicare Other | Admitting: Internal Medicine

## 2018-09-14 ENCOUNTER — Encounter: Payer: Self-pay | Admitting: Internal Medicine

## 2018-09-14 DIAGNOSIS — M797 Fibromyalgia: Secondary | ICD-10-CM | POA: Diagnosis not present

## 2018-09-14 DIAGNOSIS — F329 Major depressive disorder, single episode, unspecified: Secondary | ICD-10-CM

## 2018-09-14 DIAGNOSIS — E039 Hypothyroidism, unspecified: Secondary | ICD-10-CM | POA: Diagnosis not present

## 2018-09-14 DIAGNOSIS — F419 Anxiety disorder, unspecified: Secondary | ICD-10-CM

## 2018-09-14 DIAGNOSIS — M7989 Other specified soft tissue disorders: Secondary | ICD-10-CM

## 2018-09-14 DIAGNOSIS — F32A Depression, unspecified: Secondary | ICD-10-CM

## 2018-09-14 DIAGNOSIS — Z79890 Hormone replacement therapy (postmenopausal): Secondary | ICD-10-CM

## 2018-09-14 MED ORDER — ALPRAZOLAM 0.5 MG PO TABS: 0.5000 mg | ORAL_TABLET | Freq: Three times a day (TID) | ORAL | 0 refills | Status: DC | PRN

## 2018-09-14 NOTE — Assessment & Plan Note (Signed)
Taking Estrace 0.5 mg every other day.  Discussed continuing to try to taper off medication completely She may try black cohosh to see if that helps with some of the hot flashes. Reassured hot flashes will go away and to slowly taper Estrace from every other day to every 2 days every 3 days until off

## 2018-09-14 NOTE — Assessment & Plan Note (Signed)
Controlled, stable Continue Wellbutrin daily at current dose

## 2018-09-14 NOTE — Assessment & Plan Note (Signed)
Pain is worse The weather is likely part of the cause She is taking several supplements including CBD oil and patches, which help Encouraged his much activity as tolerated Continue with natural supplements and remedies

## 2018-09-14 NOTE — Assessment & Plan Note (Signed)
Controlled Continue Wellbutrin daily Continue Xanax daily

## 2018-09-14 NOTE — Assessment & Plan Note (Signed)
Recently she has had increased bilateral leg swelling and occasional hand swelling She states she is compliant with a low-sodium diet She does take Dyazide daily, but does not feel it is working quite as well as it used to Discussed that at some point we can consider changing her to Lasix, but she would need to be able to get blood work done after starting this we are not doing routine blood work because of coronavirus situation we will hold off on making this change for now Continue to elevate legs when sitting, which she does religiously Continue low-sodium diet We can reevaluate after the coronavirus situation has resolved

## 2018-09-14 NOTE — Assessment & Plan Note (Signed)
Has chronically low energy level-her thyroid has been fairly stable so this is unlikely related to her thyroid We will continue current dose of levothyroxine Once coronavirus situation has resolved can check blood work

## 2018-09-21 ENCOUNTER — Other Ambulatory Visit: Payer: Self-pay | Admitting: Internal Medicine

## 2018-09-22 ENCOUNTER — Ambulatory Visit (INDEPENDENT_AMBULATORY_CARE_PROVIDER_SITE_OTHER): Payer: Medicare Other | Admitting: *Deleted

## 2018-09-22 DIAGNOSIS — Z0000 Encounter for general adult medical examination without abnormal findings: Secondary | ICD-10-CM

## 2018-09-22 NOTE — Progress Notes (Addendum)
Subjective:   Laura Barnes is a 78 y.o. female who presents for Medicare Annual (Subsequent) preventive examination.  I connected with patient 09/22/18 at  2:00 PM EDT by a video enabled telemedicine application and verified that I am speaking with the correct person using two identifiers. Patient stated full name and DOB. Patient gave permission to continue with virtual visit. Patient's location was at home and Nurse's location was at [place] office.   Review of Systems:  No ROS.  Medicare Wellness Visit. Additional risk factors are reflected in the social history.  Cardiac Risk Factors include: advanced age (>54men, >5 women) Sleep patterns: has interrupted sleep, gets up 0-1 times nightly to void and sleeps 5-6 hours nightly. Patient reports insomnia issues, discussed recommended sleep tips.    Home Safety/Smoke Alarms: Feels safe in home. Smoke alarms in place.  Living environment; residence and Firearm Safety: 2-story house. Lives alone, no needs for DME, good support system Seat Belt Safety/Bike Helmet: Wears seat belt.      Objective:     Vitals: There were no vitals taken for this visit.  There is no height or weight on file to calculate BMI.  Advanced Directives 09/22/2018 09/21/2017 04/23/2017 08/07/2016 09/16/2015 06/18/2015 05/14/2015  Does Patient Have a Medical Advance Directive? Yes Yes Yes No Yes Yes Yes  Type of Paramedic of [place];Living will [place];Living will [place];Living will - [place];Living will - Living will  Does patient want to make changes to medical advance directive? Yes (ED - Information included in AVS) - - - - - -  Copy of [place] in Chart? No - copy requested No - copy requested - - - - -  Would patient like information on creating a medical advance directive? - - - No - Patient declined - - -    Tobacco Social History   Tobacco Use   Smoking Status Never Smoker  Smokeless Tobacco Never Used     Counseling given: Not Answered  Past Medical History:  Diagnosis Date  . ALLERGIC RHINITIS   . Allergy   . Anemia   . ANXIETY   . Arthritis    hands  . Bronchitis   . Chronic fatigue fibromyalgia syndrome   . Complication of anesthesia    says one time waking up she couldn't breathe, felt like her throat closing up  . DEPRESSION   . Enthesopathy of hip region   . Family history of anesthesia complication    sister with n/v  . FOOT PAIN, BILATERAL   . GERD   . Hiatal hernia   . HYPERLIPIDEMIA   . Hypothyroidism   . IBS   . MIGRAINE HEADACHE   . MIGRAINE, COMMON   . NECK MASS   . OSTEOPENIA   . OSTEOPOROSIS   . PLANTAR FASCIITIS   . RASH-NONVESICULAR   . SINUSITIS- ACUTE-NOS   . SKIN LESION   . VAGINITIS   . VENOUS INSUFFICIENCY, CHRONIC    Past Surgical History:  Procedure Laterality Date  . APPENDECTOMY    . BREAST ENHANCEMENT SURGERY    . BREAST IMPLANT REMOVAL    . CHOLECYSTECTOMY    . COLONOSCOPY    . FOOT SURGERY  11/06/2016  . MASTECTOMY     bilateral for severe bilateral fibrocystic disease  . OOPHORECTOMY    . s/p neck lump removal    . SHOULDER SURGERY    . SINUS ENDO W/FUSION  Right 06/30/2013   Procedure: RIGHT ENDOSCOPIC SPHENOIDECTOMY WITH FUSION SCAN;  Surgeon: [name], MD;  Location: [facility];  Service: ENT;  Laterality: Right;  . TONSILLECTOMY    . VAGINAL HYSTERECTOMY     Family History  Problem Relation Age of Onset  . Diabetes Mother   . Heart disease Father   . Diabetes Father   . Diabetes Sister   . Breast cancer Maternal Grandmother   . Heart disease Maternal Uncle        x 2  . Heart disease Maternal Aunt   . Kidney disease Paternal Uncle        questionable  . Heart disease Son   . Healthy Son   . Irritable bowel syndrome Son   . Colon cancer Neg Hx   . Colon polyps Neg Hx   . Rectal cancer Neg Hx   . Stomach cancer Neg Hx    Social History    Socioeconomic History  . Marital status: Widowed    Spouse name: Not on file  . Number of children: 2  . Years of education: Not on file  . Highest education level: Not on file  Occupational History  . Occupation: retired Water quality scientist  . Financial resource strain: Not hard at all  . Food insecurity:    Worry: Never true    Inability: Never true  . Transportation needs:    Medical: No    Non-medical: No  Tobacco Use  . Smoking status: Never Smoker  . Smokeless tobacco: Never Used  Substance and Sexual Activity  . Alcohol use: No  . Drug use: No  . Sexual activity: Not Currently  Lifestyle  . Physical activity:    Days per week: 0 days    Minutes per session: 0 min  . Stress: Not at all  Relationships  . Social connections:    Talks on phone: More than three times a week    Gets together: More than three times a week    Attends religious service: More than 4 times per year    Active member of club or organization: Yes    Attends meetings of clubs or organizations: More than 4 times per year    Relationship status: Widowed  Other Topics Concern  . Not on file  Social History Narrative   Has 2 biological children and 1 step child    Outpatient Encounter Medications as of 09/22/2018  Medication Sig  . 5-Hydroxytryptophan (5-HTP PO) Take 1 capsule by mouth 2 (two) times daily.  Marland Kitchen acetaminophen (TYLENOL) 500 MG tablet Take 1,000 mg by mouth every 6 (six) hours as needed for mild pain, moderate pain, fever or headache.  . ALPRAZolam (XANAX) 0.5 MG tablet Take 1 tablet (0.5 mg total) by mouth 3 (three) times daily as needed. for anxiety  . AMBULATORY NON FORMULARY MEDICATION [place]  . AMBULATORY NON FORMULARY MEDICATION Adjustable bed  . Amino Acids (AMINO ACID PO) Take 1 capsule by mouth 2 (two) times daily.  . Ascorbic Acid (VITAMIN C) 1000 MG tablet Take 1,000 mg by mouth 2 (two) times daily.  . B Complex-Biotin-FA (B COMPLETE) TABS Take 1 tablet by mouth  daily. B Complete 100mg   . benzonatate (TESSALON) 200 MG capsule TAKE 1 CAPSULE(200 MG) BY MOUTH THREE TIMES DAILY AS NEEDED FOR COUGH  . buPROPion (WELLBUTRIN XL) 150 MG 24 hr tablet TAKE 1 TABLET(150 MG) BY MOUTH EVERY MORNING  . cetirizine (ZYRTEC) 10 MG tablet TAKE 1 TABLET (10  MG TOTAL) BY MOUTH DAILY.  Marland Kitchen Cholecalciferol (VITAMIN D3) 1000 units CAPS Take 1,000 Units by mouth daily.  . Coenzyme Q10 (CO Q-10) 200 MG CAPS Take 1 capsule by mouth 2 (two) times daily.  Marland Kitchen ECHINACEA PO Take 1 tablet by mouth 2 (two) times daily.  Marland Kitchen estradiol (ESTRACE) 1 MG tablet TAKE 1/2 TABLET BY MOUTH EVERY DAY  . GOLDEN SEAL PO Take by mouth daily.  Marland Kitchen LECITHIN PO Take 1 tablet by mouth daily.  Marland Kitchen levothyroxine (SYNTHROID, LEVOTHROID) 75 MCG tablet TAKE 1 TABLET BY MOUTH EVERY DAY IN THE MORNING  . MAGNESIUM ASPARTATE PO Take 1 tablet by mouth 2 (two) times daily.  . meclizine (ANTIVERT) 25 MG tablet Take 1 tablet (25 mg total) by mouth 3 (three) times daily as needed for dizziness.  . Misc. Devices (ROLLATOR) MISC Use rollator for ambulation  . Multiple Vitamins-Minerals (ZINC PO) Take by mouth daily.  . Omega-3 Fatty Acids (SALMON OIL-1000 PO) Take 1,000 mg by mouth daily.  . ondansetron (ZOFRAN) 4 MG tablet TAKE 1 TABLET(4 MG) BY MOUTH EVERY 8 HOURS AS NEEDED FOR NAUSEA OR VOMITING  . ondansetron (ZOFRAN-ODT) 8 MG disintegrating tablet Take 1 tablet (8 mg total) by mouth every 8 (eight) hours as needed for nausea or vomiting.  Marland Kitchen POTASSIUM AMINOBENZOATE PO Take 1 tablet by mouth daily.  Marland Kitchen triamterene-hydrochlorothiazide (MAXZIDE-25) 37.5-25 MG tablet Take 1 tablet by mouth daily.  . TURMERIC PO Take 1 tablet by mouth 2 (two) times daily.   . vitamin E 400 UNIT capsule Take 400 Units by mouth daily.   No facility-administered encounter medications on file as of 09/22/2018.     Activities of Daily Living In your present state of health, do you have any difficulty performing the following activities:  09/22/2018  Hearing? N  Vision? N  Difficulty concentrating or making decisions? N  Walking or climbing stairs? N  Dressing or bathing? N  Doing errands, shopping? N  Preparing Food and eating ? N  Using the Toilet? N  In the past six months, have you accidently leaked urine? N  Do you have problems with loss of bowel control? N  Managing your Medications? N  Managing your Finances? N  Housekeeping or managing your Housekeeping? N  Some recent data might be hidden    Patient Care Team: Binnie Rail, MD as PCP - General (Internal Medicine) Bo Merino, MD as Consulting Physician (Rheumatology) Dorothy Spark, MD as Consulting Physician (Cardiology) [name], MD as Consulting Physician (Otolaryngology) Lyndal Pulley, DO as Consulting Physician (Family Medicine) Brand Males, MD as Consulting Physician (Pulmonary Disease)    Assessment:   This is a routine wellness examination for Laura Barnes. Physical assessment deferred to PCP.  Exercise Activities and Dietary recommendations Current Exercise Habits: Home exercise routine, Type of exercise: walking(exercise routine), Time (Minutes): 30, Frequency (Times/Week): 3, Weekly Exercise (Minutes/Week): 90, Intensity: Mild, Exercise limited by: Other - see comments;respiratory conditions(s)(fibromyalgia)  Diet (meal preparation, eat out, water intake, caffeinated beverages, dairy products, fruits and vegetables): in general, a "healthy" diet  , well balanced. eats a variety of fruits and vegetables daily, limits salt, fat/cholesterol, sugar,carbohydrates,caffeine, drinks 6-8 glasses of water daily.   Goals    . Patient Stated     Continue to stay engaged socially, be active within my church. Enjoy working in my flower beds and remodel some rooms in my house. Love family and Honey Bear.       Fall Risk Fall Risk  09/22/2018 09/21/2017 04/30/2017 02/28/2016 09/16/2015  Falls in the past year? 0 No Yes No No  Number  falls in past yr: 0 - - - -  Risk for fall due to : Impaired balance/gait - (No Data) - -  Risk for fall due to: Comment - - VErtigo - -  Follow up Falls prevention discussed - - - -   Depression Screen PHQ 2/9 Scores 09/22/2018 03/15/2018 09/21/2017 04/30/2017  PHQ - 2 Score 0 0 1 0  PHQ- 9 Score 4 0 5 -     Cognitive Function MMSE - Mini Mental State Exam 09/21/2017  Orientation to time 5  Orientation to Place 5  Registration 3  Attention/ Calculation 5  Recall 1  Language- name 2 objects 2  Language- repeat 1  Language- follow 3 step command 3  Language- read & follow direction 1  Write a sentence 1  Copy design 1  Total score 28       Ad8 score reviewed for issues:  Issues making decisions: no  Less interest in hobbies / activities: no  Repeats questions, stories (family complaining): no  Trouble using ordinary gadgets (microwave, computer, phone):no  Forgets the month or year: no  Mismanaging finances: no  Remembering appts: no  Daily problems with thinking and/or memory: no Ad8 score is= 0 Denies any cognitive issues. Immunization History  Administered Date(s) Administered  . Influenza Split 04/07/2011, 03/08/2012  . Influenza Whole 03/20/2008, 04/01/2009, 04/02/2010  . Influenza, High Dose Seasonal PF 04/12/2013, 03/26/2015, 03/04/2016, 04/01/2017  . Influenza,inj,Quad PF,6+ Mos 05/01/2014  . Pneumococcal Conjugate-13 05/02/2013  . Pneumococcal Polysaccharide-23 01/25/2006  . Td 09/17/2008  . Zoster 01/25/2006  . Zoster Recombinat (Shingrix) 08/18/2017, 12/06/2017   Screening Tests Health Maintenance  Topic Date Due  . TETANUS/TDAP  09/18/2018  . INFLUENZA VACCINE  12/31/2018  . DEXA SCAN  03/15/2020  . PNA vac Low Risk Adult  Completed      Plan:    Reviewed health maintenance screenings with patient today and relevant education, vaccines, and/or referrals were provided.   Continue doing brain stimulating activities (puzzles, reading, adult  coloring books, staying active) to keep memory sharp.   Continue to eat heart healthy diet (full of fruits, vegetables, whole grains, lean protein, water--limit salt, fat, and sugar intake) and increase physical activity as tolerated.   I have personally reviewed and noted the following in the patient's chart:   . Medical and social history . Use of alcohol, tobacco or illicit drugs  . Current medications and supplements . Functional ability and status . Nutritional status . Physical activity . Advanced directives . List of other physicians . Screenings to include cognitive, depression, and falls . Referrals and appointments  In addition, I have reviewed and discussed with patient certain preventive protocols, quality metrics, and best practice recommendations. A written personalized care plan for preventive services as well as general preventive health recommendations were provided to patient.     Michiel Cowboy, RN  09/22/2018    Medical screening examination/treatment/procedure(s) were performed by non-physician practitioner and as supervising physician I was immediately available for consultation/collaboration. I agree with above. Cathlean Cower, MD

## 2018-09-22 NOTE — Patient Instructions (Addendum)
Continue doing brain stimulating activities (puzzles, reading, adult coloring books, staying active) to keep memory sharp.   Continue to eat heart healthy diet (full of fruits, vegetables, whole grains, lean protein, water--limit salt, fat, and sugar intake) and increase physical activity as tolerated.   Laura Barnes , Thank you for taking time to come for your Medicare Wellness Visit. I appreciate your ongoing commitment to your health goals. Please review the following plan we discussed and let me know if I can assist you in the future.   These are the goals we discussed: Goals    . Patient Stated     Continue to stay engaged socially, be active within my church. Enjoy working in my flower beds and remodel some rooms in my house. Love family and Honey Bear.    . Patient Stated     Continue to stay socially active in church, with my family, and friends. Eat healthy and be as active as possible.       This is a list of the screening recommended for you and due dates:  Health Maintenance  Topic Date Due  . Tetanus Vaccine  09/18/2018  . Flu Shot  12/31/2018  . DEXA scan (bone density measurement)  03/15/2020  . Pneumonia vaccines  Completed    Preventive Care 49 Years and Older, Female Preventive care refers to lifestyle choices and visits with your health care provider that can promote health and wellness. What does preventive care include?  A yearly physical exam. This is also called an annual well check.  Dental exams once or twice a year.  Routine eye exams. Ask your health care provider how often you should have your eyes checked.  Personal lifestyle choices, including: ? Daily care of your teeth and gums. ? Regular physical activity. ? Eating a healthy diet. ? Avoiding tobacco and drug use. ? Limiting alcohol use. ? Practicing safe sex. ? Taking low-dose aspirin every day. ? Taking vitamin and mineral supplements as recommended by your health care provider. What  happens during an annual well check? The services and screenings done by your health care provider during your annual well check will depend on your age, overall health, lifestyle risk factors, and family history of disease. Counseling Your health care provider may ask you questions about your:  Alcohol use.  Tobacco use.  Drug use.  Emotional well-being.  Home and relationship well-being.  Sexual activity.  Eating habits.  History of falls.  Memory and ability to understand (cognition).  Work and work Statistician.  Reproductive health.  Screening You may have the following tests or measurements:  Height, weight, and BMI.  Blood pressure.  Lipid and cholesterol levels. These may be checked every 5 years, or more frequently if you are over 39 years old.  Skin check.  Lung cancer screening. You may have this screening every year starting at age 61 if you have a 30-pack-year history of smoking and currently smoke or have quit within the past 15 years.  Colorectal cancer screening. All adults should have this screening starting at age 18 and continuing until age 64. You will have tests every 1-10 years, depending on your results and the type of screening test. People at increased risk should start screening at an earlier age. Screening tests may include: ? Guaiac-based fecal occult blood testing. ? Fecal immunochemical test (FIT). ? Stool DNA test. ? Virtual colonoscopy. ? Sigmoidoscopy. During this test, a flexible tube with a tiny camera (sigmoidoscope) is used to examine  your rectum and lower colon. The sigmoidoscope is inserted through your anus into your rectum and lower colon. ? Colonoscopy. During this test, a long, thin, flexible tube with a tiny camera (colonoscope) is used to examine your entire colon and rectum.  Hepatitis C blood test.  Hepatitis B blood test.  Sexually transmitted disease (STD) testing.  Diabetes screening. This is done by checking your  blood sugar (glucose) after you have not eaten for a while (fasting). You may have this done every 1-3 years.  Bone density scan. This is done to screen for osteoporosis. You may have this done starting at age 66.  Mammogram. This may be done every 1-2 years. Talk to your health care provider about how often you should have regular mammograms. Talk with your health care provider about your test results, treatment options, and if necessary, the need for more tests. Vaccines Your health care provider may recommend certain vaccines, such as:  Influenza vaccine. This is recommended every year.  Tetanus, diphtheria, and acellular pertussis (Tdap, Td) vaccine. You may need a Td booster every 10 years.  Varicella vaccine. You may need this if you have not been vaccinated.  Zoster vaccine. You may need this after age 58.  Measles, mumps, and rubella (MMR) vaccine. You may need at least one dose of MMR if you were born in 1957 or later. You may also need a second dose.  Pneumococcal 13-valent conjugate (PCV13) vaccine. One dose is recommended after age 78.  Pneumococcal polysaccharide (PPSV23) vaccine. One dose is recommended after age 39.  Meningococcal vaccine. You may need this if you have certain conditions.  Hepatitis A vaccine. You may need this if you have certain conditions or if you travel or work in places where you may be exposed to hepatitis A.  Hepatitis B vaccine. You may need this if you have certain conditions or if you travel or work in places where you may be exposed to hepatitis B.  Haemophilus influenzae type b (Hib) vaccine. You may need this if you have certain conditions. Talk to your health care provider about which screenings and vaccines you need and how often you need them. This information is not intended to replace advice given to you by your health care provider. Make sure you discuss any questions you have with your health care provider. Document Released:  06/14/2015 Document Revised: 07/08/2017 Document Reviewed: 03/19/2015 Elsevier Interactive Patient Education  2019 [employer].

## 2018-10-17 ENCOUNTER — Encounter: Payer: Self-pay | Admitting: Internal Medicine

## 2018-10-19 ENCOUNTER — Other Ambulatory Visit: Payer: Self-pay | Admitting: Internal Medicine

## 2018-10-24 ENCOUNTER — Other Ambulatory Visit: Payer: Self-pay | Admitting: Internal Medicine

## 2018-10-24 DIAGNOSIS — F419 Anxiety disorder, unspecified: Secondary | ICD-10-CM

## 2018-10-25 NOTE — Telephone Encounter (Signed)
[place] Controlled Database Checked Last filled: 09/14/18 # 90 LOV w/you: 09/14/18 Next appt w/you: 03/24/19

## 2018-10-29 ENCOUNTER — Other Ambulatory Visit: Payer: Self-pay | Admitting: Internal Medicine

## 2018-11-09 ENCOUNTER — Telehealth: Payer: Self-pay | Admitting: Physical Medicine and Rehabilitation

## 2018-11-10 NOTE — Telephone Encounter (Signed)
Is auth needed for [id number]? Scheduled for 6/29.

## 2018-11-10 NOTE — Telephone Encounter (Signed)
Notification or Prior Authorization is not required for the requested services  This [employer] Medicare Advantage members plan does not currently require a prior authorization for [address]   Decision ID #:[id number]

## 2018-11-10 NOTE — Telephone Encounter (Signed)
Ok to repeat, can put both levels so I can remember to think about it that day

## 2018-11-23 ENCOUNTER — Encounter: Payer: Self-pay | Admitting: Internal Medicine

## 2018-11-28 ENCOUNTER — Telehealth: Payer: Self-pay

## 2018-11-28 ENCOUNTER — Encounter: Payer: Medicare Other | Admitting: Physical Medicine and Rehabilitation

## 2018-11-28 NOTE — Telephone Encounter (Signed)
The mandate is not enforceable, but ok to write letter

## 2018-11-28 NOTE — Telephone Encounter (Signed)
Please advise 

## 2018-11-28 NOTE — Telephone Encounter (Signed)
Copied from [place] [phone]. Topic: General - Call Back - No Documentation >> Nov 28, 2018  3:40 PM Erick Blinks wrote: Reason for CRM: From pt  Dr Quay Burow, would it be possible for you to furnish me with a note concerning my panic attacks?  With the mandated wearing if face masks, which I can not wear because they cause me to start smothering, and bring on panic attacks.   Please get back to me. Thanks, Jan Thayer Headings) [place] [email or web address]  [phone]  Please advise; pt is requesting note either be sent via MyChart or left at front desk.

## 2018-11-29 ENCOUNTER — Telehealth: Payer: Self-pay | Admitting: Physical Medicine and Rehabilitation

## 2018-11-29 NOTE — Telephone Encounter (Signed)
Unfortunately, I did speak with the office administrator, Cathren Laine, and he stated that despite having a doctor's note we are unable by [facility] policy t see anyone in the office without a mask or face covering.  He did say a telemedicine visit can be done but obviously that cannot be done in our case because this was an injection.  She is obviously welcome to speak with him if needed and he did say that he would be happy to talk to her if needed.

## 2018-11-29 NOTE — Telephone Encounter (Signed)
Called patient and left a voicemail to advise.

## 2018-11-29 NOTE — Telephone Encounter (Signed)
Note written. Pt will come pick up

## 2018-12-01 NOTE — Telephone Encounter (Signed)
Spoke with pt and cleared up that she will have to wear a mask in office. Spoke with office manager and I was already advised that it is mandatory to wear a mask in office. Pt is aware.

## 2018-12-01 NOTE — Telephone Encounter (Signed)
Spoke with patient regarding required mask and her options. She states that she cannot wear a mask. I advised again that per [facility] policy we cannot see her in the office without a mask at this time.

## 2018-12-01 NOTE — Telephone Encounter (Signed)
Copied from [place] [phone]. Topic: General - Other >> Dec 01, 2018 10:04 AM Yvette Rack wrote: Reason for CRM: Pt stated she has some questions for [facility]. Pt request call back from [place].

## 2018-12-02 ENCOUNTER — Encounter (INDEPENDENT_AMBULATORY_CARE_PROVIDER_SITE_OTHER): Payer: Self-pay | Admitting: Physical Medicine and Rehabilitation

## 2018-12-09 ENCOUNTER — Other Ambulatory Visit: Payer: Self-pay | Admitting: Internal Medicine

## 2018-12-09 DIAGNOSIS — F419 Anxiety disorder, unspecified: Secondary | ICD-10-CM

## 2018-12-09 NOTE — Telephone Encounter (Signed)
Last refill 10/25/18 Last OV 09/14/18 Next OV 03/24/19

## 2018-12-09 NOTE — Telephone Encounter (Signed)
Patient advised rx has been sent to her pharmacy

## 2018-12-16 ENCOUNTER — Ambulatory Visit: Payer: Medicare Other | Admitting: Family Medicine

## 2018-12-21 ENCOUNTER — Encounter: Payer: Self-pay | Admitting: Internal Medicine

## 2018-12-21 DIAGNOSIS — R300 Dysuria: Secondary | ICD-10-CM

## 2018-12-29 ENCOUNTER — Telehealth: Payer: Self-pay

## 2018-12-29 NOTE — Telephone Encounter (Signed)
Pt states the she has been more short tempered and having crying spells. Wants to go up on Wellbutrin. Feels like she is choking at times which she thinks is her nerves. I told her with all the issues she has going on she is going to need to be seen. She said she will wear a mask as long as she is able to uncover her nose to breath when she needs to. Please advise

## 2018-12-29 NOTE — Telephone Encounter (Signed)
That is ok -please schedule

## 2018-12-29 NOTE — Progress Notes (Signed)
Subjective:    Patient ID: Laura Barnes, female    DOB: [birth year], 78 y.o.   MRN: [id number]  HPI The patient is here for an acute visit.  Urinary symptoms:  She has had some difficulty urinating - hard to empty the bladder and discomfort at times in the suprapubic region.  She denies dysuria.  She has history of bladder dropping and had surgery to fix it but this feels like the bladder may have dropped again.    Her breathing is worse:  She has extremely small airways.  She feels more SOB with activities.  At times she will have difficulty breathing at rest.  The other night she woke up gasping for air.  She just gives out.  She is trying to get a follow up appointment with her pulmonologist.    Fibromyalgia:  She has low energy.  She does not have too much pain.   Soreness on right edge of sternum.  It feels like a pulling sensation. It is tender to touch.  She assumes it is the fibromyalgia.    Hot flashes , Red flush in face especially at night, but it occurs throughout the day.  It is uncomfortable and annoying.    Irritability, anxiety, depression:  Three weeks ago things started irritating her and she would get snappy with her dogs.  This is not like her.  She denies anxiety except for on occasion.  She is not actually depressed, but it has some mild depression at times due to lack of energy.  She is taking her wellbutrin and xanax daily.      Medications and allergies reviewed with patient and updated if appropriate.  Patient Active Problem List   Diagnosis Date Noted  . Leg swelling 09/14/2018  . Degenerative arthritis of knee, bilateral 07/05/2018  . Protrusion of lumbar intervertebral disc 04/20/2018  . Difficulty urinating 11/24/2017  . Dizziness 08/11/2017  . Sacroiliac pain 06/17/2017  . Greater trochanteric bursitis of right hip 05/27/2017  . Vertigo 04/29/2017  . Dysphagia 10/28/2016  . Lump of skin 05/11/2016  . 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 Chronic  meniscal tear of knee 03/05/2016  . Nausea 03/04/2016  . Lightheadedness 12/18/2015  . Osteopenia 12/03/2015  . Thyroid nodule 11/16/2015  . Bilateral leg edema 11/05/2015  . Anterior neck pain 11/05/2015  . Hormone replacement therapy (HRT) 11/05/2015  . Subacromial bursitis 09/16/2015  . 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 Trochanteric bursitis of both hips 03/12/2015  . Subacromial impingement of left shoulder 03/12/2015  . Hyperlipidemia 05/01/2014  . Chronic pain syndrome 05/01/2014  . Lumbar radiculopathy 03/27/2014  . Left shoulder pain 11/07/2013  . Degeneration of intervertebral disc of cervical region 10/10/2013  . Fibromyalgia 09/06/2013  . Bilateral shoulder pain 09/06/2013  . Low back pain 08/12/2013  . Chronic cough 04/12/2013  . Sinusitis, chronic 04/12/2013  . Hypothyroidism 04/07/2012  . Chronic venous insufficiency 04/02/2010  . Enthesopathy of hip region 07/19/2007  . Osteoporosis 07/19/2007  . Anxiety 01/23/2007  . Depression 01/23/2007  . Migraine 01/23/2007  . NINAR (noninfectious nonallergic rhinitis) 01/23/2007  . Gastroesophageal reflux disease 01/23/2007  . IBS 01/23/2007    Current Outpatient Medications on File Prior to Visit  Medication Sig Dispense Refill  . 5-Hydroxytryptophan (5-HTP PO) Take 1 capsule by mouth 2 (two) times daily.    Marland Kitchen acetaminophen (TYLENOL) 500 MG tablet Take 1,000 mg by mouth every 6 (six) hours as needed for mild pain,  moderate pain, fever or headache.    . ALPRAZolam (XANAX) 0.5 MG tablet TAKE 1 TABLET(0.5 MG) BY MOUTH THREE TIMES DAILY AS NEEDED FOR ANXIETY 90 tablet 0  . AMBULATORY NON FORMULARY MEDICATION Walker 1 Device 0  . AMBULATORY NON FORMULARY MEDICATION Adjustable bed 1 Device 0  . Amino Acids (AMINO ACID PO) Take 1 capsule by mouth 2 (two) times daily.    . Ascorbic Acid (VITAMIN C) 1000 MG tablet Take 1,000 mg by mouth 2 (two) times daily.    . B Complex-Biotin-FA  (B COMPLETE) TABS Take 1 tablet by mouth daily. B Complete 100mg     . buPROPion (WELLBUTRIN XL) 150 MG 24 hr tablet TAKE 1 TABLET(150 MG) BY MOUTH EVERY MORNING 90 tablet 1  . cetirizine (ZYRTEC) 10 MG tablet TAKE 1 TABLET (10 MG TOTAL) BY MOUTH DAILY. 30 tablet 11  . Cholecalciferol (VITAMIN D3) 1000 units CAPS Take 1,000 Units by mouth daily.    . Coenzyme Q10 (CO Q-10) 200 MG CAPS Take 1 capsule by mouth 2 (two) times daily.    Marland Kitchen ECHINACEA PO Take 1 tablet by mouth 2 (two) times daily.    Marland Kitchen GOLDEN SEAL PO Take by mouth daily.    Marland Kitchen LECITHIN PO Take 1 tablet by mouth daily.    Marland Kitchen levothyroxine (SYNTHROID) 75 MCG tablet TAKE 1 TABLET BY MOUTH EVERY DAY IN THE MORNING 90 tablet 1  . MAGNESIUM ASPARTATE PO Take 1 tablet by mouth 2 (two) times daily.    . meclizine (ANTIVERT) 25 MG tablet Take 1 tablet (25 mg total) by mouth 3 (three) times daily as needed for dizziness. 30 tablet 5  . Misc. Devices (ROLLATOR) MISC Use rollator for ambulation 1 each 0  . Multiple Vitamins-Minerals (ZINC PO) Take by mouth daily.    . Omega-3 Fatty Acids (SALMON OIL-1000 PO) Take 1,000 mg by mouth daily.    . ondansetron (ZOFRAN) 4 MG tablet TAKE 1 TABLET(4 MG) BY MOUTH EVERY 8 HOURS AS NEEDED FOR NAUSEA OR VOMITING 30 tablet 0  . ondansetron (ZOFRAN-ODT) 8 MG disintegrating tablet Take 1 tablet (8 mg total) by mouth every 8 (eight) hours as needed for nausea or vomiting. 30 tablet 2  . POTASSIUM AMINOBENZOATE PO Take 1 tablet by mouth daily.    Marland Kitchen triamterene-hydrochlorothiazide (MAXZIDE-25) 37.5-25 MG tablet TAKE 1 TABLET BY MOUTH DAILY 90 tablet 1  . TURMERIC PO Take 1 tablet by mouth 2 (two) times daily.     . vitamin E 400 UNIT capsule Take 400 Units by mouth daily.     No current facility-administered medications on file prior to visit.     Past Medical History:  Diagnosis Date  . ALLERGIC RHINITIS   . Allergy   . Anemia   . ANXIETY   . Arthritis    hands  . Bronchitis   . Chronic fatigue fibromyalgia  syndrome   . Complication of anesthesia    says one time waking up she couldn't breathe, felt like her throat closing up  . DEPRESSION   . Enthesopathy of hip region   . Family history of anesthesia complication    sister with n/v  . FOOT PAIN, BILATERAL   . GERD   . Hiatal hernia   . HYPERLIPIDEMIA   . Hypothyroidism   . IBS   . MIGRAINE HEADACHE   . MIGRAINE, COMMON   . NECK MASS   . OSTEOPENIA   . OSTEOPOROSIS   . PLANTAR FASCIITIS   . RASH-NONVESICULAR   .  SINUSITIS- ACUTE-NOS   . SKIN LESION   . VAGINITIS   . VENOUS INSUFFICIENCY, CHRONIC     Past Surgical History:  Procedure Laterality Date  . APPENDECTOMY    . BREAST ENHANCEMENT SURGERY    . BREAST IMPLANT REMOVAL    . CHOLECYSTECTOMY    . COLONOSCOPY    . FOOT SURGERY  11/06/2016  . MASTECTOMY     bilateral for severe bilateral fibrocystic disease  . OOPHORECTOMY    . s/p neck lump removal    . SHOULDER SURGERY    . SINUS ENDO W/FUSION Right 06/30/2013   Procedure: RIGHT ENDOSCOPIC SPHENOIDECTOMY WITH FUSION SCAN;  Surgeon: [name], MD;  Location: [facility];  Service: ENT;  Laterality: Right;  . TONSILLECTOMY    . VAGINAL HYSTERECTOMY      Social History   Socioeconomic History  . Marital status: Widowed    Spouse name: Not on file  . Number of children: 2  . Years of education: Not on file  . Highest education level: Not on file  Occupational History  . Occupation: retired Water quality scientist  . Financial resource strain: Not hard at all  . Food insecurity    Worry: Never true    Inability: Never true  . Transportation needs    Medical: No    Non-medical: No  Tobacco Use  . Smoking status: Never Smoker  . Smokeless tobacco: Never Used  Substance and Sexual Activity  . Alcohol use: No  . Drug use: No  . Sexual activity: Not Currently  Lifestyle  . Physical activity    Days per week: 0 days    Minutes per session: 0 min  . Stress: Not at all  Relationships  . Social  connections    Talks on phone: More than three times a week    Gets together: More than three times a week    Attends religious service: More than 4 times per year    Active member of club or organization: Yes    Attends meetings of clubs or organizations: More than 4 times per year    Relationship status: Widowed  Other Topics Concern  . Not on file  Social History Narrative   Has 2 biological children and 1 step child    Family History  Problem Relation Age of Onset  . Diabetes Mother   . Heart disease Father   . Diabetes Father   . Diabetes Sister   . Breast cancer Maternal Grandmother   . Heart disease Maternal Uncle        x 2  . Heart disease Maternal Aunt   . Kidney disease Paternal Uncle        questionable  . Heart disease Son   . Healthy Son   . Irritable bowel syndrome Son   . Colon cancer Neg Hx   . Colon polyps Neg Hx   . Rectal cancer Neg Hx   . Stomach cancer Neg Hx     Review of Systems  Constitutional: Positive for fatigue.  Respiratory: Positive for cough (chronic) and shortness of breath.   Cardiovascular: Positive for leg swelling. Negative for chest pain and palpitations.  Genitourinary: Positive for difficulty urinating (she will start urinating and then it stop and she has to wait for to ba able to go again) and pelvic pain (occ discomfort). Negative for dysuria.  Musculoskeletal: Positive for back pain.  Neurological: Positive for headaches.  Psychiatric/Behavioral: Positive for sleep disturbance (does not sleep  well).       Objective:   Vitals:   12/30/18 1432  BP: 134/76  Pulse: 76  Resp: 18  Temp: 97.6 F (36.4 C)  SpO2: 99%   BP Readings from Last 3 Encounters:  12/30/18 134/76  08/16/18 108/78  08/09/18 (!) 143/78   Wt Readings from Last 3 Encounters:  12/30/18 126 lb (57.2 kg)  08/16/18 132 lb (59.9 kg)  08/09/18 131 lb (59.4 kg)   Body mass index is 24.61 kg/m.   Physical Exam    Constitutional: Appears  well-developed and well-nourished. No distress.  HENT:  Head: Normocephalic and atraumatic.  Neck: Neck supple. No tracheal deviation present. No thyromegaly present.  No cervical lymphadenopathy Cardiovascular: Normal rate, regular rhythm and normal heart sounds.   No murmur heard. No carotid bruit .  No edema Pulmonary/Chest: Effort normal and breath sounds normal. No respiratory distress. No has no wheezes. No rales.  Abdomen; soft, ND, minimal tenderness in suprapubic region w/o rebound or guarding Skin: Skin is warm and dry. Not diaphoretic.  Psychiatric: Normal mood and affect. Behavior is normal.       Assessment & Plan:    See Problem List for Assessment and Plan of chronic medical problems.

## 2018-12-29 NOTE — Telephone Encounter (Signed)
Copied from [place] [phone]. Topic: General - Other >> Dec 28, 2018 10:19 AM Ivar Drape wrote: Reason for CRM:   Patient would like a call back from [facility] ASAP concerning her mood.  [phone]

## 2018-12-29 NOTE — Telephone Encounter (Signed)
Spoke with patient and appointment made for tomorrow with Dr. Quay Burow at 2:30 pm.

## 2018-12-30 ENCOUNTER — Encounter: Payer: Self-pay | Admitting: Internal Medicine

## 2018-12-30 ENCOUNTER — Other Ambulatory Visit (INDEPENDENT_AMBULATORY_CARE_PROVIDER_SITE_OTHER): Payer: Medicare Other

## 2018-12-30 ENCOUNTER — Ambulatory Visit (INDEPENDENT_AMBULATORY_CARE_PROVIDER_SITE_OTHER): Payer: Medicare Other | Admitting: Internal Medicine

## 2018-12-30 ENCOUNTER — Other Ambulatory Visit: Payer: Self-pay

## 2018-12-30 VITALS — BP 134/76 | HR 76 | Temp 97.6°F | Resp 18 | Ht 60.0 in | Wt 126.0 lb

## 2018-12-30 DIAGNOSIS — E039 Hypothyroidism, unspecified: Secondary | ICD-10-CM

## 2018-12-30 DIAGNOSIS — M797 Fibromyalgia: Secondary | ICD-10-CM

## 2018-12-30 DIAGNOSIS — R5383 Other fatigue: Secondary | ICD-10-CM | POA: Diagnosis not present

## 2018-12-30 DIAGNOSIS — R39198 Other difficulties with micturition: Secondary | ICD-10-CM

## 2018-12-30 DIAGNOSIS — F329 Major depressive disorder, single episode, unspecified: Secondary | ICD-10-CM

## 2018-12-30 DIAGNOSIS — F32A Depression, unspecified: Secondary | ICD-10-CM

## 2018-12-30 DIAGNOSIS — F419 Anxiety disorder, unspecified: Secondary | ICD-10-CM | POA: Diagnosis not present

## 2018-12-30 LAB — CBC WITH DIFFERENTIAL/PLATELET
Basophils Absolute: 0 10*3/uL (ref 0.0–0.1)
Basophils Relative: 0.4 % (ref 0.0–3.0)
Eosinophils Absolute: 0.2 10*3/uL (ref 0.0–0.7)
Eosinophils Relative: 1.7 % (ref 0.0–5.0)
HCT: 40.4 % (ref 36.0–46.0)
Hemoglobin: 13.8 g/dL (ref 12.0–15.0)
Lymphocytes Relative: 26.6 % (ref 12.0–46.0)
Lymphs Abs: 2.6 10*3/uL (ref 0.7–4.0)
MCHC: 34.2 g/dL (ref 30.0–36.0)
MCV: 96 fl (ref 78.0–100.0)
Monocytes Absolute: 0.8 10*3/uL (ref 0.1–1.0)
Monocytes Relative: 8.3 % (ref 3.0–12.0)
Neutro Abs: 6.1 10*3/uL (ref 1.4–7.7)
Neutrophils Relative %: 63 % (ref 43.0–77.0)
Platelets: 225 10*3/uL (ref 150.0–400.0)
RBC: 4.21 Mil/uL (ref 3.87–5.11)
RDW: 13.8 % (ref 11.5–15.5)
WBC: 9.8 10*3/uL (ref 4.0–10.5)

## 2018-12-30 LAB — COMPREHENSIVE METABOLIC PANEL
ALT: 18 U/L (ref 0–35)
AST: 23 U/L (ref 0–37)
Albumin: 4.4 g/dL (ref 3.5–5.2)
Alkaline Phosphatase: 44 U/L (ref 39–117)
BUN: 12 mg/dL (ref 6–23)
CO2: 30 mEq/L (ref 19–32)
Calcium: 10.4 mg/dL (ref 8.4–10.5)
Chloride: 95 mEq/L — ABNORMAL LOW (ref 96–112)
Creatinine, Ser: 0.86 mg/dL (ref 0.40–1.20)
GFR: 63.82 mL/min (ref >60.00)
Glucose, Bld: 102 mg/dL — ABNORMAL HIGH (ref 70–99)
Potassium: 3.2 mEq/L — ABNORMAL LOW (ref 3.5–5.1)
Sodium: 134 mEq/L — ABNORMAL LOW (ref 135–145)
Total Bilirubin: 0.4 mg/dL (ref 0.2–1.2)
Total Protein: 7.7 g/dL (ref 6.0–8.3)

## 2018-12-30 LAB — TSH: TSH: 2.82 u[IU]/mL (ref 0.35–4.50)

## 2018-12-30 MED ORDER — BUPROPION HCL ER (XL) 300 MG PO TB24: 300.0000 mg | ORAL_TABLET | Freq: Every day | ORAL | 1 refills | Status: DC

## 2018-12-30 NOTE — Patient Instructions (Addendum)
  Tests ordered today. Your results will be released to [facility] (or called to you) after review.  If any changes need to be made, you will be notified at that same time.   Medications reviewed and updated.  Changes include :   Increase the Wellbutrin to 300 mg daily.   Your prescription(s) have been submitted to your pharmacy. Please take as directed and contact our office if you believe you are having problem(s) with the medication(s).  A referral was ordered for urology

## 2018-12-31 ENCOUNTER — Encounter: Payer: Self-pay | Admitting: Internal Medicine

## 2018-12-31 DIAGNOSIS — R5383 Other fatigue: Secondary | ICD-10-CM | POA: Insufficient documentation

## 2018-12-31 NOTE — Assessment & Plan Note (Signed)
?   Controlled Continue xanax at current dose Will increase wellbutrin to 200 mg daily

## 2018-12-31 NOTE — Assessment & Plan Note (Signed)
Not ideally controlled Increase wellbutrin to 300 mg daily

## 2018-12-31 NOTE — Assessment & Plan Note (Signed)
?   Cause of hot flashes/ flushes and low energy Check tsh  Titrate med dose if needed

## 2018-12-31 NOTE — Assessment & Plan Note (Signed)
Chronic, but worse now ? Thyroid, fibromyalgia, depression Check labs - cbc, tsh, cmp

## 2018-12-31 NOTE — Assessment & Plan Note (Signed)
Pain is fairly controlled, but fibromyalgia is likely contributing to her low energy level

## 2018-12-31 NOTE — Assessment & Plan Note (Signed)
?   UTI or bladder drop - had repair for that years ago UA, Ucx to r/o infection Referral ordered for urology

## 2019-01-02 ENCOUNTER — Other Ambulatory Visit (INDEPENDENT_AMBULATORY_CARE_PROVIDER_SITE_OTHER): Payer: Medicare Other

## 2019-01-02 DIAGNOSIS — R300 Dysuria: Secondary | ICD-10-CM

## 2019-01-02 LAB — URINALYSIS, ROUTINE W REFLEX MICROSCOPIC
Bilirubin Urine: NEGATIVE
Hgb urine dipstick: NEGATIVE
Ketones, ur: NEGATIVE
Leukocytes,Ua: NEGATIVE
Nitrite: NEGATIVE
RBC / HPF: NONE SEEN (ref 0–?)
Specific Gravity, Urine: 1.015 (ref 1.000–1.030)
Total Protein, Urine: NEGATIVE
Urine Glucose: NEGATIVE
Urobilinogen, UA: 0.2 (ref 0.0–1.0)
pH: 6 (ref 5.0–8.0)

## 2019-01-04 ENCOUNTER — Encounter: Payer: Self-pay | Admitting: Internal Medicine

## 2019-01-04 LAB — URINE CULTURE
MICRO NUMBER:: 730310
SPECIMEN QUALITY:: ADEQUATE

## 2019-01-09 ENCOUNTER — Encounter (INDEPENDENT_AMBULATORY_CARE_PROVIDER_SITE_OTHER): Payer: Self-pay | Admitting: Physical Medicine and Rehabilitation

## 2019-01-10 ENCOUNTER — Other Ambulatory Visit: Payer: Self-pay | Admitting: Internal Medicine

## 2019-01-10 DIAGNOSIS — F419 Anxiety disorder, unspecified: Secondary | ICD-10-CM

## 2019-01-10 NOTE — Telephone Encounter (Signed)
Last RF 12/09/18 Next OV 03/24/19 Last OV 09/14/18

## 2019-01-12 DIAGNOSIS — H5310 Unspecified subjective visual disturbances: Secondary | ICD-10-CM | POA: Diagnosis not present

## 2019-01-12 DIAGNOSIS — H43813 Vitreous degeneration, bilateral: Secondary | ICD-10-CM | POA: Diagnosis not present

## 2019-01-16 ENCOUNTER — Encounter: Payer: Self-pay | Admitting: Internal Medicine

## 2019-01-17 ENCOUNTER — Encounter: Payer: Self-pay | Admitting: Internal Medicine

## 2019-01-17 ENCOUNTER — Encounter: Payer: Medicare Other | Admitting: Internal Medicine

## 2019-01-17 NOTE — Progress Notes (Signed)
Subjective:    Patient ID: Laura Barnes, female    DOB: 14-Oct-1940, 78 y.o.   MRN: [id number]  HPI The patient is here for an acute visit.   Her nerves are shot and everybody and everything gets on her nerves.  She has a sensation of choking in her throat and cold chills from nerves.  Her GERD is flared.  She is having hot flashes and is not sleeping.  She has frequent urination at nigh and has bladder pain.  That is also affecting her sleep.  Her IBS is acting up and she is having constipation and abdominal pain.  She wants to go back on the HRT.     Anxiety:  She is taking the xanax three times a day.  Her anxiety is not controlled and she wonders about temporarily increasing the xanax.   Depression:  She is taking the Wellbutrin and we recently increased the dose.  She thinks she must be depressed, but is not sure.     Fibromyalgia:  Her pain is not controlled.    Medications and allergies reviewed with patient and updated if appropriate.  Patient Active Problem List   Diagnosis Date Noted  . Fatigue 12/31/2018  . Leg swelling 09/14/2018  . Degenerative arthritis of knee, bilateral 07/05/2018  . Protrusion of lumbar intervertebral disc 04/20/2018  . Difficulty urinating 11/24/2017  . Dizziness 08/11/2017  . Sacroiliac pain 06/17/2017  . Greater trochanteric bursitis of right hip 05/27/2017  . Vertigo 04/29/2017  . Dysphagia 10/28/2016  . Lump of skin 05/11/2016  . 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 Chronic meniscal tear of knee 03/05/2016  . Nausea 03/04/2016  . Osteopenia 12/03/2015  . Thyroid nodule 11/16/2015  . Bilateral leg edema 11/05/2015  . Anterior neck pain 11/05/2015  . Hormone replacement therapy (HRT) 11/05/2015  . Subacromial bursitis 09/16/2015  . 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 Trochanteric bursitis of both hips 03/12/2015  . Subacromial impingement of left shoulder 03/12/2015  . Hyperlipidemia  05/01/2014  . Chronic pain syndrome 05/01/2014  . Lumbar radiculopathy 03/27/2014  . Left shoulder pain 11/07/2013  . Degeneration of intervertebral disc of cervical region 10/10/2013  . Fibromyalgia 09/06/2013  . Bilateral shoulder pain 09/06/2013  . Low back pain 08/12/2013  . Chronic cough 04/12/2013  . Sinusitis, chronic 04/12/2013  . Hypothyroidism 04/07/2012  . Chronic venous insufficiency 04/02/2010  . Enthesopathy of hip region 07/19/2007  . Osteoporosis 07/19/2007  . Anxiety 01/23/2007  . Depression 01/23/2007  . Migraine 01/23/2007  . NINAR (noninfectious nonallergic rhinitis) 01/23/2007  . Gastroesophageal reflux disease 01/23/2007  . IBS 01/23/2007    Current Outpatient Medications on File Prior to Visit  Medication Sig Dispense Refill  . 5-Hydroxytryptophan (5-HTP PO) Take 1 capsule by mouth 2 (two) times daily.    Marland Kitchen acetaminophen (TYLENOL) 500 MG tablet Take 1,000 mg by mouth every 6 (six) hours as needed for mild pain, moderate pain, fever or headache.    . ALPRAZolam (XANAX) 0.5 MG tablet TAKE 1 TABLET(0.5 MG) BY MOUTH THREE TIMES DAILY AS NEEDED FOR ANXIETY 90 tablet 0  . AMBULATORY NON FORMULARY MEDICATION Walker 1 Device 0  . AMBULATORY NON FORMULARY MEDICATION Adjustable bed 1 Device 0  . Amino Acids (AMINO ACID PO) Take 1 capsule by mouth 2 (two) times daily.    . Ascorbic Acid (VITAMIN C) 1000 MG tablet Take 1,000 mg by mouth 2 (two) times daily.    . B Complex-Biotin-FA (  B COMPLETE) TABS Take 1 tablet by mouth daily. B Complete 100mg     . buPROPion (WELLBUTRIN XL) 300 MG 24 hr tablet Take 1 tablet (300 mg total) by mouth daily. 90 tablet 1  . cetirizine (ZYRTEC) 10 MG tablet TAKE 1 TABLET (10 MG TOTAL) BY MOUTH DAILY. 30 tablet 11  . Cholecalciferol (VITAMIN D3) 1000 units CAPS Take 1,000 Units by mouth daily.    . Coenzyme Q10 (CO Q-10) 200 MG CAPS Take 1 capsule by mouth 2 (two) times daily.    Marland Kitchen ECHINACEA PO Take 1 tablet by mouth 2 (two) times daily.     Marland Kitchen GOLDEN SEAL PO Take by mouth daily.    Marland Kitchen LECITHIN PO Take 1 tablet by mouth daily.    Marland Kitchen levothyroxine (SYNTHROID) 75 MCG tablet TAKE 1 TABLET BY MOUTH EVERY DAY IN THE MORNING 90 tablet 1  . MAGNESIUM ASPARTATE PO Take 1 tablet by mouth 2 (two) times daily.    . meclizine (ANTIVERT) 25 MG tablet Take 1 tablet (25 mg total) by mouth 3 (three) times daily as needed for dizziness. 30 tablet 5  . Misc. Devices (ROLLATOR) MISC Use rollator for ambulation 1 each 0  . Multiple Vitamins-Minerals (ZINC PO) Take by mouth daily.    . Omega-3 Fatty Acids (SALMON OIL-1000 PO) Take 1,000 mg by mouth daily.    . ondansetron (ZOFRAN) 4 MG tablet TAKE 1 TABLET(4 MG) BY MOUTH EVERY 8 HOURS AS NEEDED FOR NAUSEA OR VOMITING 30 tablet 0  . ondansetron (ZOFRAN-ODT) 8 MG disintegrating tablet Take 1 tablet (8 mg total) by mouth every 8 (eight) hours as needed for nausea or vomiting. 30 tablet 2  . POTASSIUM AMINOBENZOATE PO Take 1 tablet by mouth daily.    Marland Kitchen triamterene-hydrochlorothiazide (MAXZIDE-25) 37.5-25 MG tablet TAKE 1 TABLET BY MOUTH DAILY 90 tablet 1  . TURMERIC PO Take 1 tablet by mouth 2 (two) times daily.     . vitamin E 400 UNIT capsule Take 400 Units by mouth daily.     No current facility-administered medications on file prior to visit.     Past Medical History:  Diagnosis Date  . ALLERGIC RHINITIS   . Allergy   . Anemia   . ANXIETY   . Arthritis    hands  . Bronchitis   . Chronic fatigue fibromyalgia syndrome   . Complication of anesthesia    says one time waking up she couldn't breathe, felt like her throat closing up  . DEPRESSION   . Enthesopathy of hip region   . Family history of anesthesia complication    sister with n/v  . FOOT PAIN, BILATERAL   . GERD   . Hiatal hernia   . HYPERLIPIDEMIA   . Hypothyroidism   . IBS   . MIGRAINE HEADACHE   . MIGRAINE, COMMON   . NECK MASS   . OSTEOPENIA   . OSTEOPOROSIS   . PLANTAR FASCIITIS   . RASH-NONVESICULAR   . SINUSITIS-  ACUTE-NOS   . SKIN LESION   . VAGINITIS   . VENOUS INSUFFICIENCY, CHRONIC     Past Surgical History:  Procedure Laterality Date  . APPENDECTOMY    . BREAST ENHANCEMENT SURGERY    . BREAST IMPLANT REMOVAL    . CHOLECYSTECTOMY    . COLONOSCOPY    . FOOT SURGERY  11/06/2016  . MASTECTOMY     bilateral for severe bilateral fibrocystic disease  . OOPHORECTOMY    . s/p neck lump removal    .  SHOULDER SURGERY    . SINUS ENDO W/FUSION Right 06/30/2013   Procedure: RIGHT ENDOSCOPIC SPHENOIDECTOMY WITH FUSION SCAN;  Surgeon: [name], MD;  Location: [facility];  Service: ENT;  Laterality: Right;  . TONSILLECTOMY    . VAGINAL HYSTERECTOMY      Social History   Socioeconomic History  . Marital status: Widowed    Spouse name: Not on file  . Number of children: 2  . Years of education: Not on file  . Highest education level: Not on file  Occupational History  . Occupation: retired Water quality scientist  . Financial resource strain: Not hard at all  . Food insecurity    Worry: Never true    Inability: Never true  . Transportation needs    Medical: No    Non-medical: No  Tobacco Use  . Smoking status: Never Smoker  . Smokeless tobacco: Never Used  Substance and Sexual Activity  . Alcohol use: No  . Drug use: No  . Sexual activity: Not Currently  Lifestyle  . Physical activity    Days per week: 0 days    Minutes per session: 0 min  . Stress: Not at all  Relationships  . Social connections    Talks on phone: More than three times a week    Gets together: More than three times a week    Attends religious service: More than 4 times per year    Active member of club or organization: Yes    Attends meetings of clubs or organizations: More than 4 times per year    Relationship status: Widowed  Other Topics Concern  . Not on file  Social History Narrative   Has 2 biological children and 1 step child    Family History  Problem Relation Age of Onset  . Diabetes Mother    . Heart disease Father   . Diabetes Father   . Diabetes Sister   . Breast cancer Maternal Grandmother   . Heart disease Maternal Uncle        x 2  . Heart disease Maternal Aunt   . Kidney disease Paternal Uncle        questionable  . Heart disease Son   . Healthy Son   . Irritable bowel syndrome Son   . Colon cancer Neg Hx   . Colon polyps Neg Hx   . Rectal cancer Neg Hx   . Stomach cancer Neg Hx     Review of Systems  Constitutional: Positive for chills. Negative for fever.       Hot flashes  Respiratory: Positive for cough (dry cough) and shortness of breath (chronic).   Cardiovascular: Negative for chest pain and palpitations.  Gastrointestinal: Positive for abdominal pain and constipation.       Gerd  Genitourinary: Positive for difficulty urinating (at times - not new), frequency (getting worse) and pelvic pain (bladder discomfort). Negative for dysuria and hematuria.  Musculoskeletal: Positive for back pain and myalgias.  Neurological: Positive for headaches. Negative for light-headedness.  Psychiatric/Behavioral: Positive for dysphoric mood (? does not know, but states she probably is) and sleep disturbance. The patient is nervous/anxious.        Objective:   Vitals:   01/18/19 1250  BP: 138/80  Pulse: 97  Temp: 98.2 F (36.8 C)  SpO2: 99%   BP Readings from Last 3 Encounters:  01/18/19 138/80  12/30/18 134/76  08/16/18 108/78   Wt Readings from Last 3 Encounters:  01/18/19 129 lb (  58.5 kg)  12/30/18 126 lb (57.2 kg)  08/16/18 132 lb (59.9 kg)   Body mass index is 25.19 kg/m.   Physical Exam    Constitutional: Appears well-developed and well-nourished. No distress.  HENT:  Head: Normocephalic and atraumatic.  Neck: Neck supple. No tracheal deviation present. No thyromegaly present.  No cervical lymphadenopathy Cardiovascular: Normal rate, regular rhythm and normal heart sounds.   No murmur heard. No carotid bruit .  No edema Pulmonary/Chest:  Effort normal and breath sounds normal. No respiratory distress. No has no wheezes. No rales.  Skin: Skin is warm and dry. Not diaphoretic.  Psychiatric: Normal mood and affect. Behavior is normal.       Assessment & Plan:    See Problem List for Assessment and Plan of chronic medical problems.

## 2019-01-17 NOTE — Telephone Encounter (Signed)
Appointment made

## 2019-01-17 NOTE — Progress Notes (Signed)
Virtual Visit via Video Note  I connected with Laura Barnes on 01/17/19 at  3:30 PM EDT by a video enabled telemedicine application and verified that I am speaking with the correct person using two identifiers.   I discussed the limitations of evaluation and management by telemedicine and the availability of in person appointments. The patient expressed understanding and agreed to proceed.  The patient is currently at home and I am in the office.    No referring provider.    History of Present Illness: This is an acute visit for anxiety.       Social History   Socioeconomic History  . Marital status: Widowed    Spouse name: Not on file  . Number of children: 2  . Years of education: Not on file  . Highest education level: Not on file  Occupational History  . Occupation: retired Water quality scientist  . Financial resource strain: Not hard at all  . Food insecurity    Worry: Never true    Inability: Never true  . Transportation needs    Medical: No    Non-medical: No  Tobacco Use  . Smoking status: Never Smoker  . Smokeless tobacco: Never Used  Substance and Sexual Activity  . Alcohol use: No  . Drug use: No  . Sexual activity: Not Currently  Lifestyle  . Physical activity    Days per week: 0 days    Minutes per session: 0 min  . Stress: Not at all  Relationships  . Social connections    Talks on phone: More than three times a week    Gets together: More than three times a week    Attends religious service: More than 4 times per year    Active member of club or organization: Yes    Attends meetings of clubs or organizations: More than 4 times per year    Relationship status: Widowed  Other Topics Concern  . Not on file  Social History Narrative   Has 2 biological children and 1 step child     Observations/Objective: Appears well in NAD   Assessment and Plan:  See Problem List for Assessment and Plan of chronic medical problems.   Follow  Up Instructions:    I discussed the assessment and treatment plan with the patient. The patient was provided an opportunity to ask questions and all were answered. The patient agreed with the plan and demonstrated an understanding of the instructions.   The patient was advised to call back or seek an in-person evaluation if the symptoms worsen or if the condition fails to improve as anticipated.    Binnie Rail, MD  This encounter was created in error - please disregard.

## 2019-01-18 ENCOUNTER — Other Ambulatory Visit: Payer: Self-pay

## 2019-01-18 ENCOUNTER — Encounter: Payer: Self-pay | Admitting: Internal Medicine

## 2019-01-18 ENCOUNTER — Ambulatory Visit (INDEPENDENT_AMBULATORY_CARE_PROVIDER_SITE_OTHER): Payer: Medicare Other | Admitting: Internal Medicine

## 2019-01-18 VITALS — BP 138/80 | HR 97 | Temp 98.2°F | Ht 60.0 in | Wt 129.0 lb

## 2019-01-18 DIAGNOSIS — F419 Anxiety disorder, unspecified: Secondary | ICD-10-CM | POA: Diagnosis not present

## 2019-01-18 DIAGNOSIS — Z23 Encounter for immunization: Secondary | ICD-10-CM | POA: Diagnosis not present

## 2019-01-18 DIAGNOSIS — Z79890 Hormone replacement therapy (postmenopausal): Secondary | ICD-10-CM | POA: Diagnosis not present

## 2019-01-18 DIAGNOSIS — K219 Gastro-esophageal reflux disease without esophagitis: Secondary | ICD-10-CM | POA: Diagnosis not present

## 2019-01-18 MED ORDER — ESTRADIOL 0.5 MG PO TABS: 0.5000 mg | ORAL_TABLET | Freq: Every day | ORAL | 1 refills | Status: DC

## 2019-01-18 NOTE — Assessment & Plan Note (Signed)
Not controlled Would like xanax dose increased, but I do not feel that is a good idea - restarting estradiol may help Keep xanax at current dose She will continue to take her supplements and try kava

## 2019-01-18 NOTE — Patient Instructions (Addendum)
Restart the estradiol at 0.5 mg daily.    Continue your other medications.

## 2019-01-18 NOTE — Assessment & Plan Note (Signed)
Flared  Started omeprazole 20 mg BID for now Having nausea which is likely from the GERD zofran prn, but stressed control of GERD

## 2019-01-18 NOTE — Assessment & Plan Note (Signed)
Discussed risk and benefits, including risk of blood clots with increased risk of stroke/MI and cancer Several of her symptoms are possibly related to loss of estrogen and are significantly affecting her quality of life Will restart estradiol at 0.5 mg daily

## 2019-01-23 DIAGNOSIS — H353122 Nonexudative age-related macular degeneration, left eye, intermediate dry stage: Secondary | ICD-10-CM | POA: Diagnosis not present

## 2019-01-23 DIAGNOSIS — H353111 Nonexudative age-related macular degeneration, right eye, early dry stage: Secondary | ICD-10-CM | POA: Diagnosis not present

## 2019-01-23 DIAGNOSIS — H43393 Other vitreous opacities, bilateral: Secondary | ICD-10-CM | POA: Diagnosis not present

## 2019-01-23 DIAGNOSIS — H35432 Paving stone degeneration of retina, left eye: Secondary | ICD-10-CM | POA: Diagnosis not present

## 2019-01-23 DIAGNOSIS — H35363 Drusen (degenerative) of macula, bilateral: Secondary | ICD-10-CM | POA: Diagnosis not present

## 2019-01-24 ENCOUNTER — Ambulatory Visit: Payer: Self-pay

## 2019-01-24 ENCOUNTER — Ambulatory Visit (INDEPENDENT_AMBULATORY_CARE_PROVIDER_SITE_OTHER): Payer: Medicare Other | Admitting: Physical Medicine and Rehabilitation

## 2019-01-24 ENCOUNTER — Encounter: Payer: Self-pay | Admitting: Physical Medicine and Rehabilitation

## 2019-01-24 VITALS — BP 129/77 | HR 95

## 2019-01-24 DIAGNOSIS — M5416 Radiculopathy, lumbar region: Secondary | ICD-10-CM | POA: Diagnosis not present

## 2019-01-24 MED ORDER — METHYLPREDNISOLONE ACETATE 80 MG/ML IJ SUSP
80.0000 mg | Freq: Once | INTRAMUSCULAR | Status: AC
  Administered 2019-01-24: 14:00:00 80 mg

## 2019-01-24 NOTE — Progress Notes (Signed)
.  Numeric Pain Rating Scale and Functional Assessment Average Pain 10   In the last MONTH (on 0-10 scale) has pain interfered with the following?  1. General activity like being  able to carry out your everyday physical activities such as walking, climbing stairs, carrying groceries, or moving a chair?  Rating(9)   +Driver, -BT, -Dye Allergies.  

## 2019-01-25 NOTE — Procedures (Signed)
Lumbar Epidural Steroid Injection - Interlaminar Approach with Fluoroscopic Guidance  Patient: Laura Barnes      Date of Birth: 05-30-1941 MRN: [id number] PCP: Binnie Rail, MD      Visit Date: 01/24/2019   Universal Protocol:     Consent Given By: the patient  Position: PRONE  Additional Comments: Vital signs were monitored before and after the procedure. Patient was prepped and draped in the usual sterile fashion. The correct patient, procedure, and site was verified.   Injection Procedure Details:  Procedure Site One Meds Administered:  Meds ordered this encounter  Medications  . methylPREDNISolone acetate (DEPO-MEDROL) injection 80 mg     Laterality: Right  Location/Site:  L3-L4  Needle size: 20 G  Needle type: Tuohy  Needle Placement: Paramedian epidural  Findings:   -Comments: Excellent flow of contrast into the epidural space.  Procedure Details: Using a paramedian approach from the side mentioned above, the region overlying the inferior lamina was localized under fluoroscopic visualization and the soft tissues overlying this structure were infiltrated with 4 ml. of 1% Lidocaine without Epinephrine. The Tuohy needle was inserted into the epidural space using a paramedian approach.   The epidural space was localized using loss of resistance along with lateral and bi-planar fluoroscopic views.  After negative aspirate for air, blood, and CSF, a 2 ml. volume of Isovue-250 was injected into the epidural space and the flow of contrast was observed. Radiographs were obtained for documentation purposes.    The injectate was administered into the level noted above.   Additional Comments:  The patient tolerated the procedure well Dressing: 2 x 2 sterile gauze and Band-Aid    Post-procedure details: Patient was observed during the procedure. Post-procedure instructions were reviewed.  Patient left the clinic in stable condition.

## 2019-01-25 NOTE — Progress Notes (Signed)
Laura Barnes - 78 y.o. female MRN [id number]  Date of birth: August 07, 1940  Office Visit Note: Visit Date: 01/24/2019 PCP: Binnie Rail, MD Referred by: Binnie Rail, MD  Subjective: Chief Complaint  Patient presents with  . ESI   HPI:  Laura Barnes is a 78 y.o. female who comes in today For planned right L3-4 interlaminar epidural steroid injection.  Patient has a history of lumbar scoliosis and disc herniation particularly at the L2-3 level with right sided lateral recess narrowing and some central canal narrowing.  We have completed injection in the past at L2-3 with pretty good relief.  Her case is complicated by significant fibromyalgia and multiple drug allergies which we have listed at least 22.  Interestingly she has listed prednisone with anaphylaxis but has done well with cortisone injections.  Last injection was performed with Depo-Medrol without incident.  She also list contrast dye allergy but really it is more of a shellfish allergy.  Again contrast dye was utilized last time and very small quantity without any issue.  She is failed conservative care otherwise and really has a lot going on with the fibromyalgia.  She has pain really across the whole back upper and lower but in particular has right hip and leg pain which I do feel like is coming from the spine and she did get relief last time.  She actually has small issue lower down on the back with arthritis.  ROS Otherwise per HPI.  Assessment & Plan: Visit Diagnoses:  1. Lumbar radiculopathy     Plan: No additional findings.   Meds & Orders:  Meds ordered this encounter  Medications  . methylPREDNISolone acetate (DEPO-MEDROL) injection 80 mg    Orders Placed This Encounter  Procedures  . XR C-ARM NO REPORT  . Epidural Steroid injection    Follow-up: No follow-ups on file.   Procedures: No procedures performed  Lumbar Epidural Steroid Injection - Interlaminar Approach with Fluoroscopic Guidance   Patient: Laura Barnes      Date of Birth: June 09, 1940 MRN: [id number] PCP: Binnie Rail, MD      Visit Date: 01/24/2019   Universal Protocol:     Consent Given By: the patient  Position: PRONE  Additional Comments: Vital signs were monitored before and after the procedure. Patient was prepped and draped in the usual sterile fashion. The correct patient, procedure, and site was verified.   Injection Procedure Details:  Procedure Site One Meds Administered:  Meds ordered this encounter  Medications  . methylPREDNISolone acetate (DEPO-MEDROL) injection 80 mg     Laterality: Right  Location/Site:  L3-L4  Needle size: 20 G  Needle type: Tuohy  Needle Placement: Paramedian epidural  Findings:   -Comments: Excellent flow of contrast into the epidural space.  Procedure Details: Using a paramedian approach from the side mentioned above, the region overlying the inferior lamina was localized under fluoroscopic visualization and the soft tissues overlying this structure were infiltrated with 4 ml. of 1% Lidocaine without Epinephrine. The Tuohy needle was inserted into the epidural space using a paramedian approach.   The epidural space was localized using loss of resistance along with lateral and bi-planar fluoroscopic views.  After negative aspirate for air, blood, and CSF, a 2 ml. volume of Isovue-250 was injected into the epidural space and the flow of contrast was observed. Radiographs were obtained for documentation purposes.    The injectate was administered into the level noted above.   Additional Comments:  The patient tolerated the procedure well Dressing: 2 x 2 sterile gauze and Band-Aid    Post-procedure details: Patient was observed during the procedure. Post-procedure instructions were reviewed.  Patient left the clinic in stable condition.   Clinical History: MRI LUMBAR SPINE WITHOUT CONTRAST  TECHNIQUE: Multiplanar, multisequence MR imaging  of the lumbar spine was performed. No intravenous contrast was administered.  COMPARISON:  Multiple exams, including 04/14/2010  FINDINGS: Segmentation: The lowest lumbar type non-rib-bearing vertebra is labeled as L5.  Alignment: Levoconvex lumbar scoliosis centered at the L3 level. The mild rotary component. 5 mm of degenerative retrolisthesis at L2-3 with 4 mm degenerative anterolisthesis at L3-4 and 3 mm degenerative anterolisthesis at L4-5.  Vertebrae: Type 3 degenerative endplate findings at X33443 and type 2 degenerative endplate findings at X33443 and L5-S1 with considerable loss of intervertebral disc height at all 3 levels. Facet and perifacet edema on the left at L5-S1.  Conus medullaris and cauda equina: Conus extends to the L1 level. Conus and cauda equina appear normal.  Paraspinal and other soft tissues: A fluid signal intensity lesion of the right kidney is partially included on today's exam and statistically likely to represent a cyst although technically nonspecific.  Disc levels:  T11-12: No impingement.  Small right perineural cyst.  T12-L1: No impingement.  Bilateral perineural cysts.  L1-2: No impingement.  Mild disc bulge.  Bilateral perineural cysts.  L2-3: Prominent right foraminal stenosis with prominent right subarticular lateral recess stenosis and moderate right eccentric central narrowing of the thecal sac, with moderate right and mild left displacement of the L2 nerves in the lateral extraforaminal space, due to a large right lateral recess and foraminal disc protrusion, facet arthropathy, intervertebral spurring, and the degenerative retrolisthesis at this level. The disc protrusion extends caudad about 1.3 cm in the right lateral recess, and this disc protrusion is shown on image 9/6.  L3-4: Moderate right and mild left foraminal stenosis with borderline right subarticular lateral recess stenosis due to disc uncovering, facet  spurring, and intervertebral spurring  L4-5: Mild bilateral subarticular lateral recess stenosis due to disc bulge, facet arthropathy, and ligamentum flavum redundancy. Small bilateral facet joint effusions.  L5-S1: Borderline bilateral subarticular lateral recess stenosis due to disc osteophyte complex. Small left paracentral disc protrusion.  IMPRESSION: 1. Lumbar spondylosis, scoliosis, and degenerative disc disease with multilevel new subluxations the compared to the prior lumbar MRI. The dominant finding is a large right lateral recess and foraminal disc protrusion at the L2-3 level, extending caudad. These factors result in prominent impingement at L2-3; moderate impingement at L3-4; and mild impingement at L4-5, as detailed above.   Electronically Signed   By: Van Clines M.D.   On: 07/25/2017 17:39     Objective:  VS:  HT:    WT:   BMI:     BP:129/77  HR:95bpm  TEMP: ( )  RESP:98 % Physical Exam  Ortho Exam Imaging: Xr C-arm No Report  Result Date: 01/24/2019 Please see Notes tab for imaging impression.

## 2019-02-09 ENCOUNTER — Telehealth: Payer: Self-pay | Admitting: Internal Medicine

## 2019-02-09 NOTE — Telephone Encounter (Signed)
Okay to schedule for 1015.

## 2019-02-09 NOTE — Progress Notes (Signed)
Subjective:    Patient ID: Laura Barnes, female    DOB: 23-Apr-1941, 78 y.o.   MRN: [id number]  HPI The patient is here for an acute visit.   Migraine:  She woke up three days ago with a slight migraine and got very nauseous.  Her Zofran has not been effective.  Her last migraine was 4-5 years ago.  The pain is not severe, but it is solid, which makes him more severe.  She has a lot nausea.  She has not gotten sick, but the nausea has been bad enough at times she feels like she could.  She denies any new numbness, tingling, weakness, changes in vision.  The neurologist she saw years ago for migraines advised her that they may come back.  She is unsure what the trigger was.   IBS: She has long history of IBS.  More recently her stools have been hard in the form of balls.  She has been taking stool softeners daily and probiotics, but there is been no improvement.  He also takes magnesium daily.  She has tried several other things in the past including MiraLAX and supplemental fibers and they have not helped.  She does have an appointment with her gastroenterologist in the beginning of October.     Medications and allergies reviewed with patient and updated if appropriate.  Patient Active Problem List   Diagnosis Date Noted  . Fatigue 12/31/2018  . Leg swelling 09/14/2018  . Degenerative arthritis of knee, bilateral 07/05/2018  . Protrusion of lumbar intervertebral disc 04/20/2018  . Difficulty urinating 11/24/2017  . Dizziness 08/11/2017  . Sacroiliac pain 06/17/2017  . Greater trochanteric bursitis of right hip 05/27/2017  . Vertigo 04/29/2017  . Dysphagia 10/28/2016  . Lump of skin 05/11/2016  . 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 Chronic meniscal tear of knee 03/05/2016  . Nausea 03/04/2016  . Osteopenia 12/03/2015  . Thyroid nodule 11/16/2015  . Bilateral leg edema 11/05/2015  . Anterior neck pain 11/05/2015  . Hormone replacement therapy (HRT) 11/05/2015  . Subacromial  bursitis 09/16/2015  . 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 Trochanteric bursitis of both hips 03/12/2015  . Subacromial impingement of left shoulder 03/12/2015  . Hyperlipidemia 05/01/2014  . Chronic pain syndrome 05/01/2014  . Lumbar radiculopathy 03/27/2014  . Left shoulder pain 11/07/2013  . Degeneration of intervertebral disc of cervical region 10/10/2013  . Fibromyalgia 09/06/2013  . Bilateral shoulder pain 09/06/2013  . Low back pain 08/12/2013  . Chronic cough 04/12/2013  . Sinusitis, chronic 04/12/2013  . Hypothyroidism 04/07/2012  . Chronic venous insufficiency 04/02/2010  . Enthesopathy of hip region 07/19/2007  . Osteoporosis 07/19/2007  . Anxiety 01/23/2007  . Depression 01/23/2007  . Migraine 01/23/2007  . NINAR (noninfectious nonallergic rhinitis) 01/23/2007  . Gastroesophageal reflux disease 01/23/2007  . IBS 01/23/2007    Current Outpatient Medications on File Prior to Visit  Medication Sig Dispense Refill  . 5-Hydroxytryptophan (5-HTP PO) Take 1 capsule by mouth 2 (two) times daily.    Marland Kitchen acetaminophen (TYLENOL) 500 MG tablet Take 1,000 mg by mouth every 6 (six) hours as needed for mild pain, moderate pain, fever or headache.    . ALPRAZolam (XANAX) 0.5 MG tablet TAKE 1 TABLET(0.5 MG) BY MOUTH THREE TIMES DAILY AS NEEDED FOR ANXIETY 90 tablet 0  . AMBULATORY NON FORMULARY MEDICATION Walker 1 Device 0  . AMBULATORY NON FORMULARY MEDICATION Adjustable bed 1 Device 0  . Amino Acids (AMINO  ACID PO) Take 1 capsule by mouth 2 (two) times daily.    . Ascorbic Acid (VITAMIN C) 1000 MG tablet Take 1,000 mg by mouth 2 (two) times daily.    . B Complex-Biotin-FA (B COMPLETE) TABS Take 1 tablet by mouth daily. B Complete 100mg     . buPROPion (WELLBUTRIN XL) 300 MG 24 hr tablet Take 1 tablet (300 mg total) by mouth daily. 90 tablet 1  . cetirizine (ZYRTEC) 10 MG tablet TAKE 1 TABLET (10 MG TOTAL) BY MOUTH DAILY. 30 tablet 11  .  Cholecalciferol (VITAMIN D3) 1000 units CAPS Take 1,000 Units by mouth daily.    . Coenzyme Q10 (CO Q-10) 200 MG CAPS Take 1 capsule by mouth 2 (two) times daily.    Marland Kitchen ECHINACEA PO Take 1 tablet by mouth 2 (two) times daily.    Marland Kitchen estradiol (ESTRACE) 0.5 MG tablet Take 1 tablet (0.5 mg total) by mouth daily. 90 tablet 1  . GOLDEN SEAL PO Take by mouth daily.    Marland Kitchen LECITHIN PO Take 1 tablet by mouth daily.    Marland Kitchen levothyroxine (SYNTHROID) 75 MCG tablet TAKE 1 TABLET BY MOUTH EVERY DAY IN THE MORNING 90 tablet 1  . MAGNESIUM ASPARTATE PO Take 1 tablet by mouth 2 (two) times daily.    . meclizine (ANTIVERT) 25 MG tablet Take 1 tablet (25 mg total) by mouth 3 (three) times daily as needed for dizziness. 30 tablet 5  . Misc. Devices (ROLLATOR) MISC Use rollator for ambulation 1 each 0  . Multiple Vitamins-Minerals (ZINC PO) Take by mouth daily.    . Omega-3 Fatty Acids (SALMON OIL-1000 PO) Take 1,000 mg by mouth daily.    . ondansetron (ZOFRAN) 4 MG tablet TAKE 1 TABLET(4 MG) BY MOUTH EVERY 8 HOURS AS NEEDED FOR NAUSEA OR VOMITING 30 tablet 0  . ondansetron (ZOFRAN-ODT) 8 MG disintegrating tablet Take 1 tablet (8 mg total) by mouth every 8 (eight) hours as needed for nausea or vomiting. 30 tablet 2  . POTASSIUM AMINOBENZOATE PO Take 1 tablet by mouth daily.    Marland Kitchen triamterene-hydrochlorothiazide (MAXZIDE-25) 37.5-25 MG tablet TAKE 1 TABLET BY MOUTH DAILY 90 tablet 1  . TURMERIC PO Take 1 tablet by mouth 2 (two) times daily.     . vitamin E 400 UNIT capsule Take 400 Units by mouth daily.     No current facility-administered medications on file prior to visit.     Past Medical History:  Diagnosis Date  . ALLERGIC RHINITIS   . Allergy   . Anemia   . ANXIETY   . Arthritis    hands  . Bronchitis   . Chronic fatigue fibromyalgia syndrome   . Complication of anesthesia    says one time waking up she couldn't breathe, felt like her throat closing up  . DEPRESSION   . Enthesopathy of hip region   .  Family history of anesthesia complication    sister with n/v  . FOOT PAIN, BILATERAL   . GERD   . Hiatal hernia   . HYPERLIPIDEMIA   . Hypothyroidism   . IBS   . MIGRAINE HEADACHE   . MIGRAINE, COMMON   . NECK MASS   . OSTEOPENIA   . OSTEOPOROSIS   . PLANTAR FASCIITIS   . RASH-NONVESICULAR   . SINUSITIS- ACUTE-NOS   . SKIN LESION   . VAGINITIS   . VENOUS INSUFFICIENCY, CHRONIC     Past Surgical History:  Procedure Laterality Date  . APPENDECTOMY    .  BREAST ENHANCEMENT SURGERY    . BREAST IMPLANT REMOVAL    . CHOLECYSTECTOMY    . COLONOSCOPY    . FOOT SURGERY  11/06/2016  . MASTECTOMY     bilateral for severe bilateral fibrocystic disease  . OOPHORECTOMY    . s/p neck lump removal    . SHOULDER SURGERY    . SINUS ENDO W/FUSION Right 06/30/2013   Procedure: RIGHT ENDOSCOPIC SPHENOIDECTOMY WITH FUSION SCAN;  Surgeon: [name], MD;  Location: [facility];  Service: ENT;  Laterality: Right;  . TONSILLECTOMY    . VAGINAL HYSTERECTOMY      Social History   Socioeconomic History  . Marital status: Widowed    Spouse name: Not on file  . Number of children: 2  . Years of education: Not on file  . Highest education level: Not on file  Occupational History  . Occupation: retired Water quality scientist  . Financial resource strain: Not hard at all  . Food insecurity    Worry: Never true    Inability: Never true  . Transportation needs    Medical: No    Non-medical: No  Tobacco Use  . Smoking status: Never Smoker  . Smokeless tobacco: Never Used  Substance and Sexual Activity  . Alcohol use: No  . Drug use: No  . Sexual activity: Not Currently  Lifestyle  . Physical activity    Days per week: 0 days    Minutes per session: 0 min  . Stress: Not at all  Relationships  . Social connections    Talks on phone: More than three times a week    Gets together: More than three times a week    Attends religious service: More than 4 times per year    Active member  of club or organization: Yes    Attends meetings of clubs or organizations: More than 4 times per year    Relationship status: Widowed  Other Topics Concern  . Not on file  Social History Narrative   Has 2 biological children and 1 step child    Family History  Problem Relation Age of Onset  . Diabetes Mother   . Heart disease Father   . Diabetes Father   . Diabetes Sister   . Breast cancer Maternal Grandmother   . Heart disease Maternal Uncle        x 2  . Heart disease Maternal Aunt   . Kidney disease Paternal Uncle        questionable  . Heart disease Son   . Healthy Son   . Irritable bowel syndrome Son   . Colon cancer Neg Hx   . Colon polyps Neg Hx   . Rectal cancer Neg Hx   . Stomach cancer Neg Hx     Review of Systems  Constitutional: Negative for chills and fever.  Eyes: Negative for visual disturbance.  Gastrointestinal: Positive for nausea. Negative for vomiting.  Neurological: Positive for headaches. Negative for numbness.       Objective:   Vitals:   02/10/19 1031  BP: (!) 150/80  Pulse: 83  Resp: 16  Temp: 98.4 F (36.9 C)  SpO2: 99%   BP Readings from Last 3 Encounters:  02/10/19 (!) 150/80  01/24/19 129/77  01/18/19 138/80   Wt Readings from Last 3 Encounters:  02/10/19 129 lb (58.5 kg)  01/18/19 129 lb (58.5 kg)  12/30/18 126 lb (57.2 kg)   Body mass index is 25.19 kg/m.   Physical  Exam Constitutional:      General: She is not in acute distress.    Appearance: Normal appearance. She is not ill-appearing.  HENT:     Head: Normocephalic and atraumatic.  Skin:    General: Skin is warm and dry.  Neurological:     General: No focal deficit present.     Mental Status: She is alert.     Cranial Nerves: No cranial nerve deficit.     Sensory: No sensory deficit.     Motor: No weakness.  Psychiatric:        Mood and Affect: Mood normal.            Assessment & Plan:    See Problem List for Assessment and Plan of chronic  medical problems.

## 2019-02-09 NOTE — Telephone Encounter (Signed)
Thank you. Appointment has been scheduled. °

## 2019-02-09 NOTE — Telephone Encounter (Signed)
Patient called stating that she woke up Tuesday morning with a slight migraine and very sick on her stomach. It has continued with no relief. She has tried Zofran but it has not helped. She would like to make an appointment with you for tomorrow but you are currently full. I offered another provider but she refused. Would you be willing to double book and see her tomorrow? - If so, what would be a good time for you?

## 2019-02-10 ENCOUNTER — Encounter: Payer: Self-pay | Admitting: Internal Medicine

## 2019-02-10 ENCOUNTER — Ambulatory Visit (INDEPENDENT_AMBULATORY_CARE_PROVIDER_SITE_OTHER): Payer: Medicare Other | Admitting: Internal Medicine

## 2019-02-10 ENCOUNTER — Other Ambulatory Visit: Payer: Self-pay

## 2019-02-10 VITALS — BP 150/80 | HR 83 | Temp 98.4°F | Resp 16 | Ht 60.0 in | Wt 129.0 lb

## 2019-02-10 DIAGNOSIS — G43809 Other migraine, not intractable, without status migrainosus: Secondary | ICD-10-CM

## 2019-02-10 DIAGNOSIS — K581 Irritable bowel syndrome with constipation: Secondary | ICD-10-CM | POA: Diagnosis not present

## 2019-02-10 MED ORDER — PROCHLORPERAZINE MALEATE 10 MG PO TABS: 10.0000 mg | ORAL_TABLET | Freq: Four times a day (QID) | ORAL | 0 refills | Status: DC | PRN

## 2019-02-10 MED ORDER — HYDROMORPHONE HCL 4 MG PO TABS: 4.0000 mg | ORAL_TABLET | ORAL | 0 refills | Status: DC | PRN

## 2019-02-10 NOTE — Patient Instructions (Addendum)
For your migraine:   Take the compazine as needed for nausea.     Take the dilaudid as needed for the migraine pain.     Please call if there is no improvement in your symptoms.

## 2019-02-10 NOTE — Assessment & Plan Note (Signed)
She is a long history of migraines and started to have 1 3 days ago and is intractable No concerning findings on exam or symptoms Zofran is not helping the severe nausea and she still has had pain She has multiple medication allergies and is not able to take typical medications we will use for migraines She has taken Dilaudid in the past to help break the cycle and I will prescribe that for a few days only We will also prescribe Compazine If she continues to have migraines we will consider neurology referral She will call if there is no improvement

## 2019-02-10 NOTE — Assessment & Plan Note (Signed)
Currently experiencing constipation predominant IBS Discussed several things that she could try-most of which she has already tried in the past and they have not been effective She does have an appointment with GI in a couple weeks and will discuss with them I did advise that the Dilaudid may increase her constipation and she needs to be careful of that

## 2019-02-12 ENCOUNTER — Other Ambulatory Visit: Payer: Self-pay | Admitting: Internal Medicine

## 2019-02-12 DIAGNOSIS — F419 Anxiety disorder, unspecified: Secondary | ICD-10-CM

## 2019-02-13 NOTE — Telephone Encounter (Signed)
Last RF 01/10/19 Last OV 09/14/18 Next OV 03/24/19

## 2019-02-20 ENCOUNTER — Ambulatory Visit (INDEPENDENT_AMBULATORY_CARE_PROVIDER_SITE_OTHER): Payer: Medicare Other | Admitting: Internal Medicine

## 2019-02-20 ENCOUNTER — Encounter: Payer: Self-pay | Admitting: Internal Medicine

## 2019-02-20 ENCOUNTER — Other Ambulatory Visit: Payer: Self-pay

## 2019-02-20 DIAGNOSIS — R42 Dizziness and giddiness: Secondary | ICD-10-CM

## 2019-02-20 MED ORDER — MECLIZINE HCL 25 MG PO TABS: 25.0000 mg | ORAL_TABLET | Freq: Three times a day (TID) | ORAL | 5 refills | Status: DC | PRN

## 2019-02-20 NOTE — Progress Notes (Signed)
Subjective:    Patient ID: Laura Barnes, female    DOB: 07-27-1940, 78 y.o.   MRN: [id number]  HPI The patient is here for an acute visit.   Vertigo since her migraine migraine on 9/11 - the dizziness has been intermittent.  She has been taking benadryl, which helps but not as well as meclizine.  She does not have any meclizine at home.  The intensity can be mild to severe.  Her balance can be very bad.  Sometimes the dizziness is associated with head movements.  She has severe nausea.   She is eating crackers and ginerale, bananas, PB.  She is a long history of vertigo.  This episode seems to be worse than other episodes and she is unsure why.  She denies any other concerning symptoms.  Medications and allergies reviewed with patient and updated if appropriate.  Patient Active Problem List   Diagnosis Date Noted  . Fatigue 12/31/2018  . Leg swelling 09/14/2018  . Degenerative arthritis of knee, bilateral 07/05/2018  . Protrusion of lumbar intervertebral disc 04/20/2018  . Difficulty urinating 11/24/2017  . Dizziness 08/11/2017  . Sacroiliac pain 06/17/2017  . Greater trochanteric bursitis of right hip 05/27/2017  . Vertigo 04/29/2017  . Dysphagia 10/28/2016  . Lump of skin 05/11/2016  . 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 Chronic meniscal tear of knee 03/05/2016  . Nausea 03/04/2016  . Osteopenia 12/03/2015  . Thyroid nodule 11/16/2015  . Bilateral leg edema 11/05/2015  . Anterior neck pain 11/05/2015  . Hormone replacement therapy (HRT) 11/05/2015  . Subacromial bursitis 09/16/2015  . 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 Trochanteric bursitis of both hips 03/12/2015  . Subacromial impingement of left shoulder 03/12/2015  . Hyperlipidemia 05/01/2014  . Chronic pain syndrome 05/01/2014  . Lumbar radiculopathy 03/27/2014  . Left shoulder pain 11/07/2013  . Degeneration of intervertebral disc of cervical region 10/10/2013  .  Fibromyalgia 09/06/2013  . Bilateral shoulder pain 09/06/2013  . Low back pain 08/12/2013  . Chronic cough 04/12/2013  . Sinusitis, chronic 04/12/2013  . Hypothyroidism 04/07/2012  . Chronic venous insufficiency 04/02/2010  . Enthesopathy of hip region 07/19/2007  . Osteoporosis 07/19/2007  . Anxiety 01/23/2007  . Depression 01/23/2007  . Migraine 01/23/2007  . NINAR (noninfectious nonallergic rhinitis) 01/23/2007  . Gastroesophageal reflux disease 01/23/2007  . IBS 01/23/2007    Current Outpatient Medications on File Prior to Visit  Medication Sig Dispense Refill  . 5-Hydroxytryptophan (5-HTP PO) Take 1 capsule by mouth 2 (two) times daily.    Marland Kitchen acetaminophen (TYLENOL) 500 MG tablet Take 1,000 mg by mouth every 6 (six) hours as needed for mild pain, moderate pain, fever or headache.    . ALPRAZolam (XANAX) 0.5 MG tablet TAKE 1 TABLET(0.5 MG) BY MOUTH THREE TIMES DAILY AS NEEDED FOR ANXIETY 90 tablet 0  . AMBULATORY NON FORMULARY MEDICATION Walker 1 Device 0  . AMBULATORY NON FORMULARY MEDICATION Adjustable bed 1 Device 0  . Amino Acids (AMINO ACID PO) Take 1 capsule by mouth 2 (two) times daily.    . Ascorbic Acid (VITAMIN C) 1000 MG tablet Take 1,000 mg by mouth 2 (two) times daily.    . B Complex-Biotin-FA (B COMPLETE) TABS Take 1 tablet by mouth daily. B Complete 100mg     . buPROPion (WELLBUTRIN XL) 300 MG 24 hr tablet Take 1 tablet (300 mg total) by mouth daily. 90 tablet 1  . cetirizine (ZYRTEC) 10 MG tablet TAKE 1 TABLET (  10 MG TOTAL) BY MOUTH DAILY. 30 tablet 11  . Cholecalciferol (VITAMIN D3) 1000 units CAPS Take 1,000 Units by mouth daily.    . Coenzyme Q10 (CO Q-10) 200 MG CAPS Take 1 capsule by mouth 2 (two) times daily.    Marland Kitchen ECHINACEA PO Take 1 tablet by mouth 2 (two) times daily.    Marland Kitchen estradiol (ESTRACE) 0.5 MG tablet Take 1 tablet (0.5 mg total) by mouth daily. 90 tablet 1  . GOLDEN SEAL PO Take by mouth daily.    Marland Kitchen LECITHIN PO Take 1 tablet by mouth daily.    Marland Kitchen  levothyroxine (SYNTHROID) 75 MCG tablet TAKE 1 TABLET BY MOUTH EVERY DAY IN THE MORNING 90 tablet 1  . MAGNESIUM ASPARTATE PO Take 1 tablet by mouth 2 (two) times daily.    . meclizine (ANTIVERT) 25 MG tablet Take 1 tablet (25 mg total) by mouth 3 (three) times daily as needed for dizziness. 30 tablet 5  . Misc. Devices (ROLLATOR) MISC Use rollator for ambulation 1 each 0  . Multiple Vitamins-Minerals (ZINC PO) Take by mouth daily.    . Omega-3 Fatty Acids (SALMON OIL-1000 PO) Take 1,000 mg by mouth daily.    . ondansetron (ZOFRAN) 4 MG tablet TAKE 1 TABLET(4 MG) BY MOUTH EVERY 8 HOURS AS NEEDED FOR NAUSEA OR VOMITING 30 tablet 0  . ondansetron (ZOFRAN-ODT) 8 MG disintegrating tablet Take 1 tablet (8 mg total) by mouth every 8 (eight) hours as needed for nausea or vomiting. 30 tablet 2  . POTASSIUM AMINOBENZOATE PO Take 1 tablet by mouth daily.    . prochlorperazine (COMPAZINE) 10 MG tablet Take 1 tablet (10 mg total) by mouth every 6 (six) hours as needed for nausea or vomiting. 30 tablet 0  . triamterene-hydrochlorothiazide (MAXZIDE-25) 37.5-25 MG tablet TAKE 1 TABLET BY MOUTH DAILY 90 tablet 1  . TURMERIC PO Take 1 tablet by mouth 2 (two) times daily.     . vitamin E 400 UNIT capsule Take 400 Units by mouth daily.     No current facility-administered medications on file prior to visit.     Past Medical History:  Diagnosis Date  . ALLERGIC RHINITIS   . Allergy   . Anemia   . ANXIETY   . Arthritis    hands  . Bronchitis   . Chronic fatigue fibromyalgia syndrome   . Complication of anesthesia    says one time waking up she couldn't breathe, felt like her throat closing up  . DEPRESSION   . Enthesopathy of hip region   . Family history of anesthesia complication    sister with n/v  . FOOT PAIN, BILATERAL   . GERD   . Hiatal hernia   . HYPERLIPIDEMIA   . Hypothyroidism   . IBS   . MIGRAINE HEADACHE   . MIGRAINE, COMMON   . NECK MASS   . OSTEOPENIA   . OSTEOPOROSIS   .  PLANTAR FASCIITIS   . RASH-NONVESICULAR   . SINUSITIS- ACUTE-NOS   . SKIN LESION   . VAGINITIS   . VENOUS INSUFFICIENCY, CHRONIC     Past Surgical History:  Procedure Laterality Date  . APPENDECTOMY    . BREAST ENHANCEMENT SURGERY    . BREAST IMPLANT REMOVAL    . CHOLECYSTECTOMY    . COLONOSCOPY    . FOOT SURGERY  11/06/2016  . MASTECTOMY     bilateral for severe bilateral fibrocystic disease  . OOPHORECTOMY    . s/p neck lump removal    .  SHOULDER SURGERY    . SINUS ENDO W/FUSION Right 06/30/2013   Procedure: RIGHT ENDOSCOPIC SPHENOIDECTOMY WITH FUSION SCAN;  Surgeon: [name], MD;  Location: [facility];  Service: ENT;  Laterality: Right;  . TONSILLECTOMY    . VAGINAL HYSTERECTOMY      Social History   Socioeconomic History  . Marital status: Widowed    Spouse name: Not on file  . Number of children: 2  . Years of education: Not on file  . Highest education level: Not on file  Occupational History  . Occupation: retired Water quality scientist  . Financial resource strain: Not hard at all  . Food insecurity    Worry: Never true    Inability: Never true  . Transportation needs    Medical: No    Non-medical: No  Tobacco Use  . Smoking status: Never Smoker  . Smokeless tobacco: Never Used  Substance and Sexual Activity  . Alcohol use: No  . Drug use: No  . Sexual activity: Not Currently  Lifestyle  . Physical activity    Days per week: 0 days    Minutes per session: 0 min  . Stress: Not at all  Relationships  . Social connections    Talks on phone: More than three times a week    Gets together: More than three times a week    Attends religious service: More than 4 times per year    Active member of club or organization: Yes    Attends meetings of clubs or organizations: More than 4 times per year    Relationship status: Widowed  Other Topics Concern  . Not on file  Social History Narrative   Has 2 biological children and 1 step child    Family  History  Problem Relation Age of Onset  . Diabetes Mother   . Heart disease Father   . Diabetes Father   . Diabetes Sister   . Breast cancer Maternal Grandmother   . Heart disease Maternal Uncle        x 2  . Heart disease Maternal Aunt   . Kidney disease Paternal Uncle        questionable  . Heart disease Son   . Healthy Son   . Irritable bowel syndrome Son   . Colon cancer Neg Hx   . Colon polyps Neg Hx   . Rectal cancer Neg Hx   . Stomach cancer Neg Hx     Review of Systems  Constitutional: Negative for chills and fever.  HENT: Negative for congestion, ear pain, sinus pain and sore throat.   Respiratory: Negative for shortness of breath.   Cardiovascular: Negative for chest pain and palpitations.  Gastrointestinal: Positive for nausea.  Neurological: Positive for dizziness and headaches. Negative for weakness and numbness.       Objective:   Vitals:   02/20/19 1056  BP: 130/80  Pulse: 84  Resp: 16  Temp: 98.2 F (36.8 C)  SpO2: 97%   BP Readings from Last 3 Encounters:  02/20/19 130/80  02/10/19 (!) 150/80  01/24/19 129/77   Wt Readings from Last 3 Encounters:  02/20/19 128 lb 12.8 oz (58.4 kg)  02/10/19 129 lb (58.5 kg)  01/18/19 129 lb (58.5 kg)   Body mass index is 25.15 kg/m.   Physical Exam Constitutional:      Appearance: Normal appearance.  HENT:     Head: Normocephalic and atraumatic.     Right Ear: Tympanic membrane, ear canal and  external ear normal.     Left Ear: Tympanic membrane, ear canal and external ear normal.  Cardiovascular:     Rate and Rhythm: Normal rate and regular rhythm.  Pulmonary:     Effort: Pulmonary effort is normal. No respiratory distress.     Breath sounds: Normal breath sounds. No wheezing.  Skin:    General: Skin is warm and dry.  Neurological:     General: No focal deficit present.     Mental Status: She is alert.  Psychiatric:        Mood and Affect: Mood normal.            Assessment & Plan:     See Problem List for Assessment and Plan of chronic medical problems.

## 2019-02-20 NOTE — Assessment & Plan Note (Signed)
Hx of vertigo - this episode worse than usual, but no concerning symptoms - no signs of infection Meclizine as needed - which has always worked well She will let me know if her symptoms do not improve

## 2019-02-20 NOTE — Patient Instructions (Addendum)
Take the meclizine as needed.   Use your anti-nausea medication as needed.    Please call if there is no improvement in your symptoms.

## 2019-03-03 ENCOUNTER — Telehealth: Payer: Self-pay | Admitting: Internal Medicine

## 2019-03-03 NOTE — Telephone Encounter (Signed)
Pt stated in the last 3-4 weeks she has had rxs go to incorrect pharmacies. She wants to make sure all her rxs go to  [facility] [place] [place] [facility] [place] [phone] (Phone) [phone] (Fax) Pt was very frustrated because she has had to have rxs transferred multiple times between [facility] locations. Please advise.

## 2019-03-03 NOTE — Telephone Encounter (Signed)
This is the only pharmacy listed in pts chart. Will make sure rxs go to the current pharmacy.

## 2019-03-08 ENCOUNTER — Other Ambulatory Visit: Payer: Self-pay | Admitting: Rheumatology

## 2019-03-16 DIAGNOSIS — R3911 Hesitancy of micturition: Secondary | ICD-10-CM | POA: Diagnosis not present

## 2019-03-23 ENCOUNTER — Other Ambulatory Visit: Payer: Self-pay | Admitting: Internal Medicine

## 2019-03-23 DIAGNOSIS — F419 Anxiety disorder, unspecified: Secondary | ICD-10-CM

## 2019-03-23 NOTE — Telephone Encounter (Signed)
Last RF 02/15/19 Last OV 09/14/18 Next OV 03/24/19

## 2019-03-23 NOTE — Progress Notes (Signed)
Subjective:    Patient ID: Laura Barnes, female    DOB: June 16, 1940, 78 y.o.   MRN: [id number]  HPI The patient is here for follow up.    Choking:  All this week she has been choking. She wakes up choking.  She chokes with her medications, water, food.  She is unsure why - nothing has changed.  She feels the choking is nerve related - take the xanax or a herbal supplement helps.   Hypothyroidism:  She is taking her medication daily.  She denies any recent changes in energy or weight that are unexplained.   Fibromyalgia:  Her overall pain is improved.  She is taking the arnica regularly.  We had also increased the wellbutrin and put her back on estradiol.    HRT: she was off the estradiol, but had significant hot flashes and other symptoms that affected her quality of life.  We restarted the estrace last two months ago.    Leg swelling:  She is taking the maxzide daily.  She elevates her legs.  She avoid salt.  Her leg edema is mild.    Anxiety, Depression: We increased her wellbutrin at her last visit.  She is taking her medication daily as prescribed. She denies any side effects from the medication. She feels her depression and anxiety are controlled and she is happy with her current dose of medication.   Cut on left hand:  She fell 4 days ago.  She was unsure if she needed a stitch.  It bleeds if it is hit.  She is applying neosporin.     Medications and allergies reviewed with patient and updated if appropriate.  Patient Active Problem List   Diagnosis Date Noted  . Laceration of left hand without foreign body 03/24/2019  . Fatigue 12/31/2018  . Leg swelling 09/14/2018  . Degenerative arthritis of knee, bilateral 07/05/2018  . Protrusion of lumbar intervertebral disc 04/20/2018  . Difficulty urinating 11/24/2017  . Dizziness 08/11/2017  . Sacroiliac pain 06/17/2017  . Greater trochanteric bursitis of right hip 05/27/2017  . Vertigo 04/29/2017  . Dysphagia  10/28/2016  . Lump of skin 05/11/2016  . 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 Chronic meniscal tear of knee 03/05/2016  . Nausea 03/04/2016  . Osteopenia 12/03/2015  . Thyroid nodule 11/16/2015  . Bilateral leg edema 11/05/2015  . Anterior neck pain 11/05/2015  . Hormone replacement therapy (HRT) 11/05/2015  . Subacromial bursitis 09/16/2015  . 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 Trochanteric bursitis of both hips 03/12/2015  . Subacromial impingement of left shoulder 03/12/2015  . Hyperlipidemia 05/01/2014  . Chronic pain syndrome 05/01/2014  . Lumbar radiculopathy 03/27/2014  . Left shoulder pain 11/07/2013  . Degeneration of intervertebral disc of cervical region 10/10/2013  . Fibromyalgia 09/06/2013  . Bilateral shoulder pain 09/06/2013  . Low back pain 08/12/2013  . Chronic cough 04/12/2013  . Sinusitis, chronic 04/12/2013  . Hypothyroidism 04/07/2012  . Chronic venous insufficiency 04/02/2010  . Enthesopathy of hip region 07/19/2007  . Osteoporosis 07/19/2007  . Anxiety 01/23/2007  . Depression 01/23/2007  . Migraine 01/23/2007  . NINAR (noninfectious nonallergic rhinitis) 01/23/2007  . Gastroesophageal reflux disease 01/23/2007  . IBS 01/23/2007    Current Outpatient Medications on File Prior to Visit  Medication Sig Dispense Refill  . 5-Hydroxytryptophan (5-HTP PO) Take 1 capsule by mouth 2 (two) times daily.    Marland Kitchen acetaminophen (TYLENOL) 500 MG tablet Take 1,000 mg by mouth every  6 (six) hours as needed for mild pain, moderate pain, fever or headache.    . ALPRAZolam (XANAX) 0.5 MG tablet TAKE 1 TABLET(0.5 MG) BY MOUTH THREE TIMES DAILY AS NEEDED FOR ANXIETY 90 tablet 0  . AMBULATORY NON FORMULARY MEDICATION Walker 1 Device 0  . AMBULATORY NON FORMULARY MEDICATION Adjustable bed 1 Device 0  . Amino Acids (AMINO ACID PO) Take 1 capsule by mouth 2 (two) times daily.    . Ascorbic Acid (VITAMIN C) 1000 MG tablet Take 1,000 mg by  mouth 2 (two) times daily.    . B Complex-Biotin-FA (B COMPLETE) TABS Take 1 tablet by mouth daily. B Complete 100mg     . buPROPion (WELLBUTRIN XL) 300 MG 24 hr tablet Take 1 tablet (300 mg total) by mouth daily. 90 tablet 1  . cetirizine (ZYRTEC) 10 MG tablet TAKE 1 TABLET (10 MG TOTAL) BY MOUTH DAILY. 30 tablet 11  . Cholecalciferol (VITAMIN D3) 1000 units CAPS Take 1,000 Units by mouth daily.    . Coenzyme Q10 (CO Q-10) 200 MG CAPS Take 1 capsule by mouth 2 (two) times daily.    Marland Kitchen ECHINACEA PO Take 1 tablet by mouth 2 (two) times daily.    Marland Kitchen estradiol (ESTRACE) 0.5 MG tablet Take 1 tablet (0.5 mg total) by mouth daily. 90 tablet 1  . GOLDEN SEAL PO Take by mouth daily.    Marland Kitchen LECITHIN PO Take 1 tablet by mouth daily.    Marland Kitchen levothyroxine (SYNTHROID) 75 MCG tablet TAKE 1 TABLET BY MOUTH EVERY DAY IN THE MORNING 90 tablet 1  . MAGNESIUM ASPARTATE PO Take 1 tablet by mouth 2 (two) times daily.    . meclizine (ANTIVERT) 25 MG tablet Take 1 tablet (25 mg total) by mouth 3 (three) times daily as needed for dizziness. 60 tablet 5  . Misc. Devices (ROLLATOR) MISC Use rollator for ambulation 1 each 0  . Multiple Vitamins-Minerals (ZINC PO) Take by mouth daily.    . Omega-3 Fatty Acids (SALMON OIL-1000 PO) Take 1,000 mg by mouth daily.    . ondansetron (ZOFRAN) 4 MG tablet TAKE 1 TABLET(4 MG) BY MOUTH EVERY 8 HOURS AS NEEDED FOR NAUSEA OR VOMITING 30 tablet 0  . ondansetron (ZOFRAN-ODT) 8 MG disintegrating tablet Take 1 tablet (8 mg total) by mouth every 8 (eight) hours as needed for nausea or vomiting. 30 tablet 2  . POTASSIUM AMINOBENZOATE PO Take 1 tablet by mouth daily.    . prochlorperazine (COMPAZINE) 10 MG tablet Take 1 tablet (10 mg total) by mouth every 6 (six) hours as needed for nausea or vomiting. 30 tablet 0  . triamterene-hydrochlorothiazide (MAXZIDE-25) 37.5-25 MG tablet TAKE 1 TABLET BY MOUTH DAILY 90 tablet 1  . TURMERIC PO Take 1 tablet by mouth 2 (two) times daily.     . vitamin E 400  UNIT capsule Take 400 Units by mouth daily.     No current facility-administered medications on file prior to visit.     Past Medical History:  Diagnosis Date  . ALLERGIC RHINITIS   . Allergy   . Anemia   . ANXIETY   . Arthritis    hands  . Bronchitis   . Chronic fatigue fibromyalgia syndrome   . Complication of anesthesia    says one time waking up she couldn't breathe, felt like her throat closing up  . DEPRESSION   . Enthesopathy of hip region   . Family history of anesthesia complication    sister with n/v  .  FOOT PAIN, BILATERAL   . GERD   . Hiatal hernia   . HYPERLIPIDEMIA   . Hypothyroidism   . IBS   . MIGRAINE HEADACHE   . MIGRAINE, COMMON   . NECK MASS   . OSTEOPENIA   . OSTEOPOROSIS   . PLANTAR FASCIITIS   . RASH-NONVESICULAR   . SINUSITIS- ACUTE-NOS   . SKIN LESION   . VAGINITIS   . VENOUS INSUFFICIENCY, CHRONIC     Past Surgical History:  Procedure Laterality Date  . APPENDECTOMY    . BREAST ENHANCEMENT SURGERY    . BREAST IMPLANT REMOVAL    . CHOLECYSTECTOMY    . COLONOSCOPY    . FOOT SURGERY  11/06/2016  . MASTECTOMY     bilateral for severe bilateral fibrocystic disease  . OOPHORECTOMY    . s/p neck lump removal    . SHOULDER SURGERY    . SINUS ENDO W/FUSION Right 06/30/2013   Procedure: RIGHT ENDOSCOPIC SPHENOIDECTOMY WITH FUSION SCAN;  Surgeon: [name], MD;  Location: [facility];  Service: ENT;  Laterality: Right;  . TONSILLECTOMY    . VAGINAL HYSTERECTOMY      Social History   Socioeconomic History  . Marital status: Widowed    Spouse name: Not on file  . Number of children: 2  . Years of education: Not on file  . Highest education level: Not on file  Occupational History  . Occupation: retired Water quality scientist  . Financial resource strain: Not hard at all  . Food insecurity    Worry: Never true    Inability: Never true  . Transportation needs    Medical: No    Non-medical: No  Tobacco Use  . Smoking status:  Never Smoker  . Smokeless tobacco: Never Used  Substance and Sexual Activity  . Alcohol use: No  . Drug use: No  . Sexual activity: Not Currently  Lifestyle  . Physical activity    Days per week: 0 days    Minutes per session: 0 min  . Stress: Not at all  Relationships  . Social connections    Talks on phone: More than three times a week    Gets together: More than three times a week    Attends religious service: More than 4 times per year    Active member of club or organization: Yes    Attends meetings of clubs or organizations: More than 4 times per year    Relationship status: Widowed  Other Topics Concern  . Not on file  Social History Narrative   Has 2 biological children and 1 step child    Family History  Problem Relation Age of Onset  . Diabetes Mother   . Heart disease Father   . Diabetes Father   . Diabetes Sister   . Breast cancer Maternal Grandmother   . Heart disease Maternal Uncle        x 2  . Heart disease Maternal Aunt   . Kidney disease Paternal Uncle        questionable  . Heart disease Son   . Healthy Son   . Irritable bowel syndrome Son   . Colon cancer Neg Hx   . Colon polyps Neg Hx   . Rectal cancer Neg Hx   . Stomach cancer Neg Hx     Review of Systems  Constitutional: Negative for chills and fever.  Respiratory: Positive for cough (chronic, no change - daily) and shortness of breath (chronic, intermittent).  Negative for wheezing.   Cardiovascular: Positive for leg swelling (chronic, mild). Negative for chest pain and palpitations.  Musculoskeletal: Positive for arthralgias, back pain and myalgias.  Neurological: Positive for light-headedness (intermittent) and headaches (intermittent).       Objective:   Vitals:   03/24/19 1324  BP: 124/72  Pulse: 80  Resp: 16  Temp: 98.5 F (36.9 C)  SpO2: 98%   BP Readings from Last 3 Encounters:  03/24/19 124/72  02/20/19 130/80  02/10/19 (!) 150/80   Wt Readings from Last 3  Encounters:  03/24/19 125 lb 12.8 oz (57.1 kg)  02/20/19 128 lb 12.8 oz (58.4 kg)  02/10/19 129 lb (58.5 kg)   Body mass index is 24.57 kg/m.   Physical Exam    Constitutional: Appears well-developed and well-nourished. No distress.  HENT:  Head: Normocephalic and atraumatic.  Neck: Neck supple. No tracheal deviation present. No thyromegaly present.  No cervical lymphadenopathy Cardiovascular: Normal rate, regular rhythm and normal heart sounds.  No murmur heard. No carotid bruit .  Trace b/l LE edema Pulmonary/Chest: Effort normal and breath sounds normal. No respiratory distress. No has no wheezes. No rales.  Skin: Skin is warm and dry. Not diaphoretic. 1/2 inch laceration closed, no discharge and bleeding Psychiatric: Normal mood and affect. Behavior is normal.      Assessment & Plan:    See Problem List for Assessment and Plan of chronic medical problems.

## 2019-03-24 ENCOUNTER — Other Ambulatory Visit (INDEPENDENT_AMBULATORY_CARE_PROVIDER_SITE_OTHER): Payer: Medicare Other

## 2019-03-24 ENCOUNTER — Encounter: Payer: Self-pay | Admitting: Internal Medicine

## 2019-03-24 ENCOUNTER — Other Ambulatory Visit: Payer: Self-pay

## 2019-03-24 ENCOUNTER — Ambulatory Visit (INDEPENDENT_AMBULATORY_CARE_PROVIDER_SITE_OTHER): Payer: Medicare Other | Admitting: Internal Medicine

## 2019-03-24 VITALS — BP 124/72 | HR 80 | Temp 98.5°F | Resp 16 | Ht 60.0 in | Wt 125.8 lb

## 2019-03-24 DIAGNOSIS — F32A Depression, unspecified: Secondary | ICD-10-CM

## 2019-03-24 DIAGNOSIS — Z79890 Hormone replacement therapy (postmenopausal): Secondary | ICD-10-CM

## 2019-03-24 DIAGNOSIS — S61412A Laceration without foreign body of left hand, initial encounter: Secondary | ICD-10-CM | POA: Diagnosis not present

## 2019-03-24 DIAGNOSIS — T17308A Unspecified foreign body in larynx causing other injury, initial encounter: Secondary | ICD-10-CM

## 2019-03-24 DIAGNOSIS — F329 Major depressive disorder, single episode, unspecified: Secondary | ICD-10-CM

## 2019-03-24 DIAGNOSIS — F419 Anxiety disorder, unspecified: Secondary | ICD-10-CM | POA: Diagnosis not present

## 2019-03-24 DIAGNOSIS — E039 Hypothyroidism, unspecified: Secondary | ICD-10-CM

## 2019-03-24 DIAGNOSIS — M797 Fibromyalgia: Secondary | ICD-10-CM

## 2019-03-24 DIAGNOSIS — Z23 Encounter for immunization: Secondary | ICD-10-CM | POA: Diagnosis not present

## 2019-03-24 DIAGNOSIS — G43809 Other migraine, not intractable, without status migrainosus: Secondary | ICD-10-CM

## 2019-03-24 DIAGNOSIS — E876 Hypokalemia: Secondary | ICD-10-CM

## 2019-03-24 LAB — BASIC METABOLIC PANEL
BUN: 12 mg/dL (ref 6–23)
CO2: 28 mEq/L (ref 19–32)
Calcium: 10.4 mg/dL (ref 8.4–10.5)
Chloride: 99 mEq/L (ref 96–112)
Creatinine, Ser: 0.9 mg/dL (ref 0.40–1.20)
GFR: 60.52 mL/min (ref >60.00)
Glucose, Bld: 90 mg/dL (ref 70–99)
Potassium: 3.5 mEq/L (ref 3.5–5.1)
Sodium: 137 mEq/L (ref 135–145)

## 2019-03-24 LAB — CBC WITH DIFFERENTIAL/PLATELET
Basophils Absolute: 0 10*3/uL (ref 0.0–0.1)
Basophils Relative: 0.5 % (ref 0.0–3.0)
Eosinophils Absolute: 0.1 10*3/uL (ref 0.0–0.7)
Eosinophils Relative: 1.5 % (ref 0.0–5.0)
HCT: 39.3 % (ref 36.0–46.0)
Hemoglobin: 13.2 g/dL (ref 12.0–15.0)
Lymphocytes Relative: 24.8 % (ref 12.0–46.0)
Lymphs Abs: 2 10*3/uL (ref 0.7–4.0)
MCHC: 33.6 g/dL (ref 30.0–36.0)
MCV: 97.3 fl (ref 78.0–100.0)
Monocytes Absolute: 0.8 10*3/uL (ref 0.1–1.0)
Monocytes Relative: 10.3 % (ref 3.0–12.0)
Neutro Abs: 5.1 10*3/uL (ref 1.4–7.7)
Neutrophils Relative %: 62.9 % (ref 43.0–77.0)
Platelets: 225 10*3/uL (ref 150.0–400.0)
RBC: 4.03 Mil/uL (ref 3.87–5.11)
RDW: 13.5 % (ref 11.5–15.5)
WBC: 8.1 10*3/uL (ref 4.0–10.5)

## 2019-03-24 LAB — TSH: TSH: 1.18 u[IU]/mL (ref 0.35–4.50)

## 2019-03-24 IMAGING — DX DG LUMBAR SPINE COMPLETE 4+V
5 series · 5 of 5 positions shown · non-contrast
Comparison: 03/27/2014.

CLINICAL DATA: Chronic low back pain, worse for 2 weeks.

EXAM:
LUMBAR SPINE - COMPLETE 4+ VIEW

[l-spine ap]
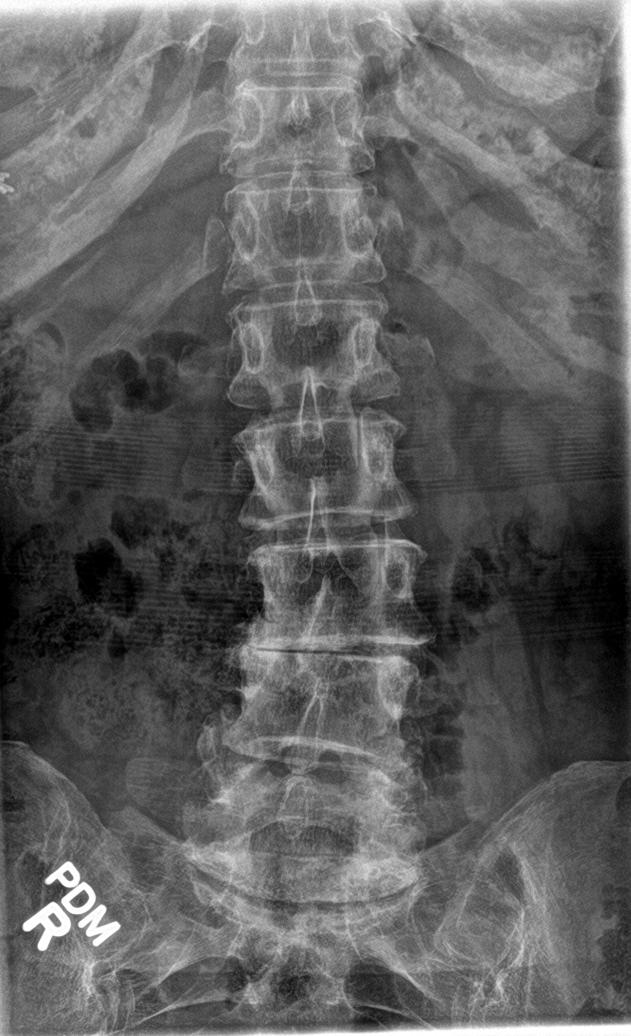

[l-spine obl (1 of 2)]
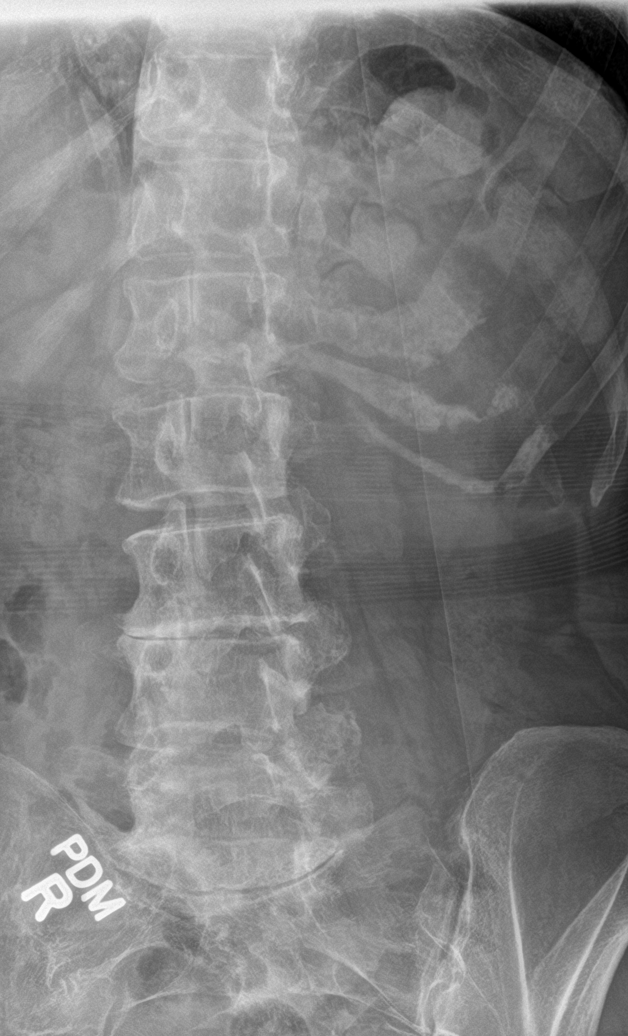

[l-spine obl (2 of 2)]
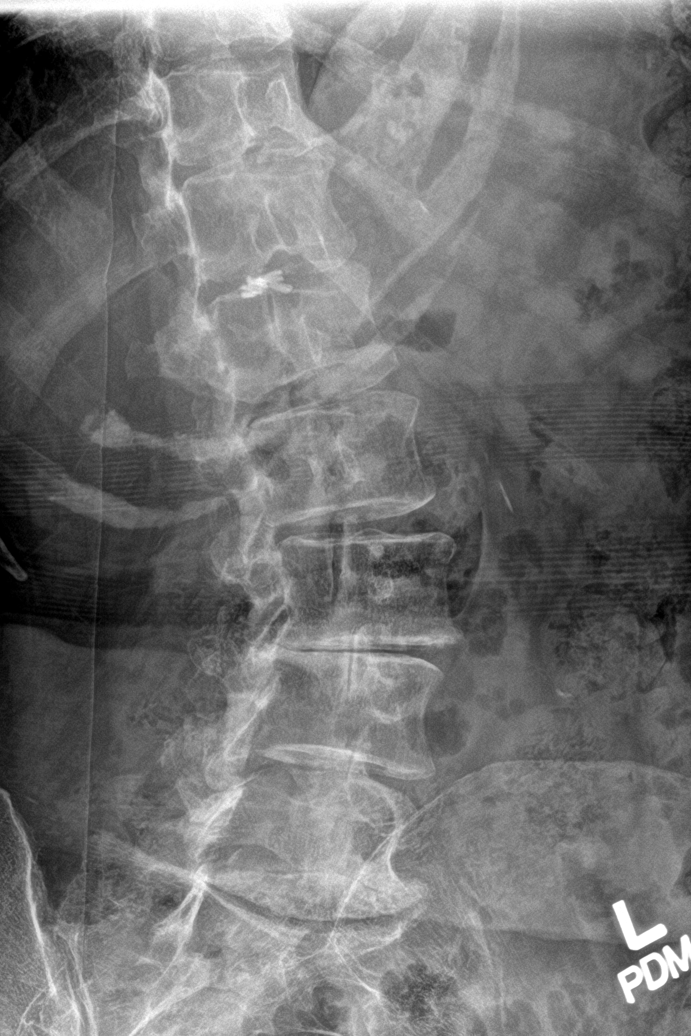

[l-spine lat]
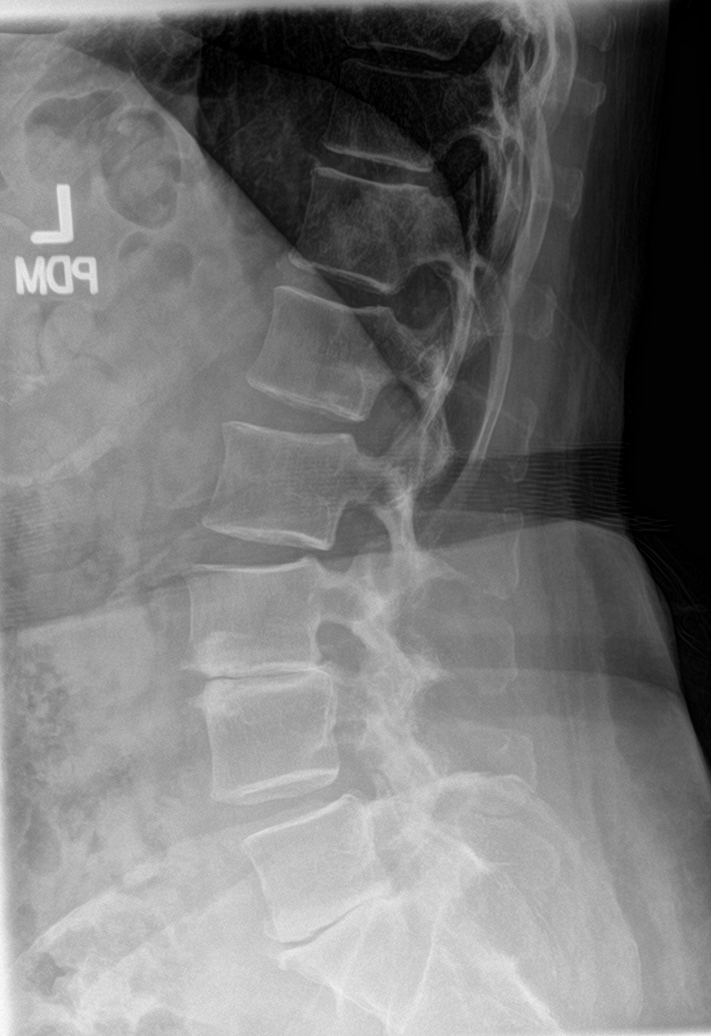

[l-spine spot]
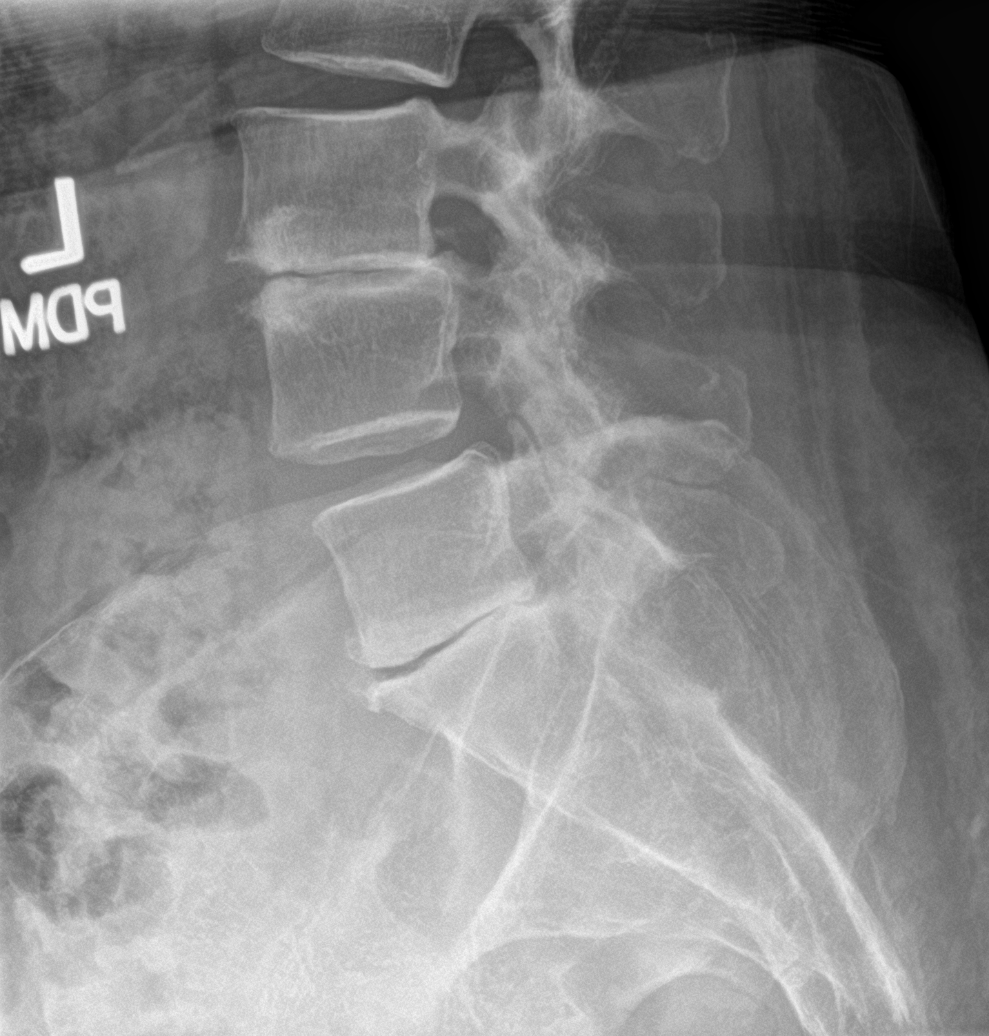

[5 of 5 positions shown; findings below may reference images not displayed]

FINDINGS: Levoconvex scoliosis centered at L3-4, approximately 15 degrees.
Asymmetric loss of interspace height on the RIGHT at L3-4 is
contributory. Severe loss of interspace height at L5-S1 is noted. 4
mm anterolisthesis L4-5 is facet mediated. 2 mm anterolisthesis L3-4
also facet mediated. Trace compensatory retrolisthesis of L3.

No worrisome osseous lesion. Aortic atherosclerosis. Lower lumbar
facet arthropathy. Similar appearance to priors.
IMPRESSION: Lumbar spondylosis as described.  Similar appearance to 4230.

## 2019-03-24 NOTE — Patient Instructions (Addendum)
  Tests ordered today. Your results will be released to [facility] (or called to you) after review.  If any changes need to be made, you will be notified at that same time.  Tetanus vaccine given today.   Medications reviewed and updated.  Changes include :   none  Your prescription(s) have been submitted to your pharmacy. Please take as directed and contact our office if you believe you are having problem(s) with the medication(s).    Please followup in 6 months

## 2019-03-24 NOTE — Assessment & Plan Note (Signed)
bmp 

## 2019-03-24 NOTE — Assessment & Plan Note (Signed)
Controlled, stable Continue current dose of medication  

## 2019-03-24 NOTE — Assessment & Plan Note (Signed)
Improved Taking arnica regularly and multiple supplements Taking wellbutrin, estradiol Anxiety and depression controlled

## 2019-03-24 NOTE — Addendum Note (Signed)
Addended by: Delice Bison E on: 03/24/2019 03:23 PM   Modules accepted: Orders

## 2019-03-24 NOTE — Assessment & Plan Note (Signed)
Symptoms improved with estrace Will continue -- benefits/risks  of the medication have been discussed  - the benefits outweigh the risks for her

## 2019-03-24 NOTE — Assessment & Plan Note (Signed)
Chronic Intermittent Takes otc meds, anti-nausea medications as needed

## 2019-03-24 NOTE — Assessment & Plan Note (Signed)
Small laceration No evidence of infection tdap not up to date - will give that to her today

## 2019-03-24 NOTE — Addendum Note (Signed)
Addended by: Delice Bison E on: 03/24/2019 03:29 PM   Modules accepted: Orders

## 2019-03-24 NOTE — Assessment & Plan Note (Signed)
Clinically euthyroid Check tsh  Titrate med dose if needed  

## 2019-03-24 NOTE — Assessment & Plan Note (Signed)
Chronic, intermittent - worse this past week Occurs with saliva, pills, food and water -- she feels it is nerve related - improve with xanax or a supplement This is not dysphagia Will continue to monitor

## 2019-03-29 ENCOUNTER — Ambulatory Visit: Payer: Self-pay

## 2019-03-29 ENCOUNTER — Other Ambulatory Visit: Payer: Self-pay

## 2019-03-29 ENCOUNTER — Encounter: Payer: Self-pay | Admitting: Rheumatology

## 2019-03-29 ENCOUNTER — Ambulatory Visit (INDEPENDENT_AMBULATORY_CARE_PROVIDER_SITE_OTHER): Payer: Medicare Other | Admitting: Rheumatology

## 2019-03-29 VITALS — BP 128/68 | HR 93 | Resp 16 | Ht 60.0 in | Wt 128.0 lb

## 2019-03-29 DIAGNOSIS — F32A Depression, unspecified: Secondary | ICD-10-CM

## 2019-03-29 DIAGNOSIS — M19042 Primary osteoarthritis, left hand: Secondary | ICD-10-CM

## 2019-03-29 DIAGNOSIS — M7061 Trochanteric bursitis, right hip: Secondary | ICD-10-CM

## 2019-03-29 DIAGNOSIS — M7062 Trochanteric bursitis, left hip: Secondary | ICD-10-CM

## 2019-03-29 DIAGNOSIS — M5030 Other cervical disc degeneration, unspecified cervical region: Secondary | ICD-10-CM

## 2019-03-29 DIAGNOSIS — M79641 Pain in right hand: Secondary | ICD-10-CM

## 2019-03-29 DIAGNOSIS — Z8639 Personal history of other endocrine, nutritional and metabolic disease: Secondary | ICD-10-CM

## 2019-03-29 DIAGNOSIS — M7542 Impingement syndrome of left shoulder: Secondary | ICD-10-CM

## 2019-03-29 DIAGNOSIS — M19041 Primary osteoarthritis, right hand: Secondary | ICD-10-CM | POA: Diagnosis not present

## 2019-03-29 DIAGNOSIS — M5136 Other intervertebral disc degeneration, lumbar region: Secondary | ICD-10-CM | POA: Diagnosis not present

## 2019-03-29 DIAGNOSIS — E041 Nontoxic single thyroid nodule: Secondary | ICD-10-CM

## 2019-03-29 DIAGNOSIS — I872 Venous insufficiency (chronic) (peripheral): Secondary | ICD-10-CM

## 2019-03-29 DIAGNOSIS — M8589 Other specified disorders of bone density and structure, multiple sites: Secondary | ICD-10-CM

## 2019-03-29 DIAGNOSIS — M79642 Pain in left hand: Secondary | ICD-10-CM

## 2019-03-29 DIAGNOSIS — F329 Major depressive disorder, single episode, unspecified: Secondary | ICD-10-CM

## 2019-03-29 DIAGNOSIS — G894 Chronic pain syndrome: Secondary | ICD-10-CM

## 2019-03-29 DIAGNOSIS — Z8719 Personal history of other diseases of the digestive system: Secondary | ICD-10-CM

## 2019-03-29 DIAGNOSIS — F419 Anxiety disorder, unspecified: Secondary | ICD-10-CM

## 2019-03-29 DIAGNOSIS — Z79890 Hormone replacement therapy (postmenopausal): Secondary | ICD-10-CM

## 2019-03-29 DIAGNOSIS — M797 Fibromyalgia: Secondary | ICD-10-CM

## 2019-03-29 DIAGNOSIS — E039 Hypothyroidism, unspecified: Secondary | ICD-10-CM

## 2019-03-29 NOTE — Progress Notes (Signed)
Office Visit Note  Patient: Laura Barnes             Date of Birth: [birth year]           MRN: [id number]             PCP: Binnie Rail, MD Referring: Binnie Rail, MD Visit Date: 03/29/2019 Occupation: _0 @  Subjective:  Pain in both hands.   History of Present Illness: JARELI HIGHLAND is a 78 y.o. female with history of osteoarthritis, fibromyalgia and degenerative disc disease.  She states she continues to have discomfort all over due to fibromyalgia.  She continues to have C-spine and lumbar spine pain.  She is been having pain and stiffness in her bilateral hands.  She also notices intermittent swelling.  Her bursitis is better.  Activities of Daily Living:  Patient reports morning stiffness for 5-10 minutes.   Patient Reports nocturnal pain.  Difficulty dressing/grooming: Denies Difficulty climbing stairs: Reports Difficulty getting out of chair: Reports Difficulty using hands for taps, buttons, cutlery, and/or writing: Reports  Review of Systems  Constitutional: Positive for fatigue. Negative for night sweats, weight gain and weight loss.  HENT: Positive for nose dryness. Negative for mouth sores, trouble swallowing, trouble swallowing and mouth dryness.   Eyes: Negative for pain, redness, itching, visual disturbance and dryness.  Respiratory: Negative for cough, shortness of breath and difficulty breathing.   Cardiovascular: Negative for chest pain, palpitations, hypertension, irregular heartbeat and swelling in legs/feet.  Gastrointestinal: Positive for constipation and diarrhea. Negative for blood in stool.  Endocrine: Negative for increased urination.  Genitourinary: Negative for painful urination and vaginal dryness.  Musculoskeletal: Positive for arthralgias, joint pain, joint swelling, muscle weakness, morning stiffness and muscle tenderness. Negative for myalgias and myalgias.  Skin: Negative for color change, rash, hair loss, skin tightness,  ulcers and sensitivity to sunlight.  Allergic/Immunologic: Negative for susceptible to infections.  Neurological: Negative for dizziness, numbness, headaches, memory loss, night sweats and weakness.  Hematological: Negative for bruising/bleeding tendency and swollen glands.  Psychiatric/Behavioral: Negative for depressed mood, confusion and sleep disturbance. The patient is not nervous/anxious.     PMFS History:  Patient Active Problem List   Diagnosis Date Noted  . Laceration of left hand without foreign body 03/24/2019  . Choking 03/24/2019  . Hypokalemia 03/24/2019  . Fatigue 12/31/2018  . Leg swelling 09/14/2018  . Degenerative arthritis of knee, bilateral 07/05/2018  . Protrusion of lumbar intervertebral disc 04/20/2018  . Difficulty urinating 11/24/2017  . Dizziness 08/11/2017  . Sacroiliac pain 06/17/2017  . Greater trochanteric bursitis of right hip 05/27/2017  . Vertigo 04/29/2017  . Dysphagia 10/28/2016  . Lump of skin 05/11/2016  . 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 Chronic meniscal tear of knee 03/05/2016  . Nausea 03/04/2016  . Osteopenia 12/03/2015  . Thyroid nodule 11/16/2015  . Bilateral leg edema 11/05/2015  . Anterior neck pain 11/05/2015  . Hormone replacement therapy (HRT) 11/05/2015  . Subacromial bursitis 09/16/2015  . 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 Trochanteric bursitis of both hips 03/12/2015  . Subacromial impingement of left shoulder 03/12/2015  . Hyperlipidemia 05/01/2014  . Chronic pain syndrome 05/01/2014  . Lumbar radiculopathy 03/27/2014  . Left shoulder pain 11/07/2013  . Degeneration of intervertebral disc of cervical region 10/10/2013  . Fibromyalgia 09/06/2013  . Bilateral shoulder pain 09/06/2013  . Low back pain 08/12/2013  . Chronic cough 04/12/2013  . Sinusitis, chronic 04/12/2013  . Hypothyroidism 04/07/2012  .  Chronic venous insufficiency 04/02/2010  . Enthesopathy of hip region  07/19/2007  . Osteoporosis 07/19/2007  . Anxiety 01/23/2007  . Depression 01/23/2007  . Migraine 01/23/2007  . NINAR (noninfectious nonallergic rhinitis) 01/23/2007  . Gastroesophageal reflux disease 01/23/2007  . IBS 01/23/2007    Past Medical History:  Diagnosis Date  . ALLERGIC RHINITIS   . Allergy   . Anemia   . ANXIETY   . Arthritis    hands  . Bronchitis   . Chronic fatigue fibromyalgia syndrome   . Complication of anesthesia    says one time waking up she couldn't breathe, felt like her throat closing up  . DEPRESSION   . Enthesopathy of hip region   . Family history of anesthesia complication    sister with n/v  . FOOT PAIN, BILATERAL   . GERD   . Hiatal hernia   . HYPERLIPIDEMIA   . Hypothyroidism   . IBS   . MIGRAINE HEADACHE   . MIGRAINE, COMMON   . NECK MASS   . OSTEOPENIA   . OSTEOPOROSIS   . PLANTAR FASCIITIS   . RASH-NONVESICULAR   . SINUSITIS- ACUTE-NOS   . SKIN LESION   . VAGINITIS   . VENOUS INSUFFICIENCY, CHRONIC     Family History  Problem Relation Age of Onset  . Diabetes Mother   . Heart disease Father   . Diabetes Father   . Diabetes Sister   . Breast cancer Maternal Grandmother   . Heart disease Maternal Uncle        x 2  . Heart disease Maternal Aunt   . Kidney disease Paternal Uncle        questionable  . Heart disease Son   . Healthy Son   . Irritable bowel syndrome Son   . Colon cancer Neg Hx   . Colon polyps Neg Hx   . Rectal cancer Neg Hx   . Stomach cancer Neg Hx    Past Surgical History:  Procedure Laterality Date  . APPENDECTOMY    . BREAST ENHANCEMENT SURGERY    . BREAST IMPLANT REMOVAL    . CHOLECYSTECTOMY    . COLONOSCOPY    . FOOT SURGERY  11/06/2016  . MASTECTOMY     bilateral for severe bilateral fibrocystic disease  . OOPHORECTOMY    . s/p neck lump removal    . SHOULDER SURGERY    . SINUS ENDO W/FUSION Right 06/30/2013   Procedure: RIGHT ENDOSCOPIC SPHENOIDECTOMY WITH FUSION SCAN;  Surgeon: [name], MD;  Location: [facility];  Service: ENT;  Laterality: Right;  . TONSILLECTOMY    . VAGINAL HYSTERECTOMY     Social History   Social History Narrative   Has 2 biological children and 1 step child   Immunization History  Administered Date(s) Administered  . Fluad Quad(high Dose 65+) 01/18/2019  . Influenza Split 04/07/2011, 03/08/2012  . Influenza Whole 03/20/2008, 04/01/2009, 04/02/2010  . Influenza, High Dose Seasonal PF 04/12/2013, 03/26/2015, 03/04/2016, 04/01/2017  . Influenza,inj,Quad PF,6+ Mos 05/01/2014  . Pneumococcal Conjugate-13 05/02/2013  . Pneumococcal Polysaccharide-23 01/25/2006  . Td 09/17/2008  . Tdap 03/24/2019  . Zoster 01/25/2006  . Zoster Recombinat (Shingrix) 08/18/2017, 12/06/2017     Objective: Vital Signs: BP 128/68 (BP Location: Left Arm, Patient Position: Sitting, Cuff Size: Normal)   Pulse 93   Resp 16   Ht 5' (1.524 m)   Wt 128 lb (58.1 kg)   BMI 25.00 kg/m    Physical Exam Vitals signs and  nursing note reviewed.  Constitutional:      Appearance: She is well-developed.  HENT:     Head: Normocephalic and atraumatic.  Eyes:     Conjunctiva/sclera: Conjunctivae normal.  Neck:     Musculoskeletal: Normal range of motion.  Cardiovascular:     Rate and Rhythm: Normal rate and regular rhythm.     Heart sounds: Normal heart sounds.  Pulmonary:     Effort: Pulmonary effort is normal.     Breath sounds: Normal breath sounds.  Abdominal:     General: Bowel sounds are normal.     Palpations: Abdomen is soft.  Lymphadenopathy:     Cervical: No cervical adenopathy.  Skin:    General: Skin is warm and dry.     Capillary Refill: Capillary refill takes less than 2 seconds.  Neurological:     Mental Status: She is alert and oriented to person, place, and time.  Psychiatric:        Behavior: Behavior normal.      Musculoskeletal Exam: Patient has good range of motion of cervical spine.  She has painful range of motion of her lumbar  spine.  She had discomfort range of motion of bilateral shoulder joints.  She has left elbow injury in the past.  Wrist joints MCPs PIPs were in good range of motion with no synovitis.  She has bilateral CMC PIP and DIP thickening consistent with osteoarthritis.  No synovitis was noted.  Hip joints, knee joints were in good range of motion.  She has tenderness over both the trochanteric bursa.  She has generalized hyperalgesia.  CDAI Exam: CDAI Score: - Patient Global: -; Provider Global: - Swollen: -; Tender: - Joint Exam   No joint exam has been documented for this visit   There is currently no information documented on the homunculus. Go to the Rheumatology activity and complete the homunculus joint exam.  Investigation: No additional findings.  Imaging: [place] Extrem Up Bilat Comp  Result Date: 03/29/2019 Ultrasound examination of bilateral hands was performed per EULAR recommendations. Using 12 MHz transducer, grayscale and power Doppler bilateral second, third, and fifth MCP joints and bilateral wrist joints both dorsal and volar aspects were evaluated to look for synovitis or tenosynovitis. The findings were there was NO synovitis or tenosynovitis on ultrasound examination. Right median nerve was 0.6 cm squares which was within normal limit and left median nerve was 0.7 cm squares which was within normal limits. Impression: Ultrasound examination did not show any synovitis.  Bilateral median nerves are within normal limits.   Recent Labs: Lab Results  Component Value Date   WBC 8.1 03/24/2019   HGB 13.2 03/24/2019   PLT 225.0 03/24/2019   NA 137 03/24/2019   K 3.5 03/24/2019   CL 99 03/24/2019   CO2 28 03/24/2019   GLUCOSE 90 03/24/2019   BUN 12 03/24/2019   CREATININE 0.90 03/24/2019   BILITOT 0.4 12/30/2018   ALKPHOS 44 12/30/2018   AST 23 12/30/2018   ALT 18 12/30/2018   PROT 7.7 12/30/2018   ALBUMIN 4.4 12/30/2018   CALCIUM 10.4 03/24/2019   GFRAA >60 04/23/2017     Speciality Comments: No specialty comments available.  Procedures:  No procedures performed Allergies: Prednisone, Amoxicillin-pot clavulanate, Aspirin, Erythromycin, Levofloxacin, Nsaids, Statins, Sumatriptan, Adhesive [tape], Ivp dye [iodinated diagnostic agents], Methylphenidate hcl, Mirtazapine, Red yeast rice [cholestin], Shellfish allergy, Soy allergy, Doxycycline, Hydrocodone, Hydrocodone-acetaminophen, Latex, Rofecoxib, Sertraline hcl, and Sulfonamide derivatives   Assessment / Plan:     Visit  Diagnoses: Primary osteoarthritis of both hands - August 09, 2018 ESR 36, uric acid 2.4, RF negative, anti-CCP negative, _0 eta negative.  She has been experiencing pain in her bilateral hands.  Ultrasound examination done today did not show any synovitis.  Bilateral median nerves were also within normal limits.  I detailed discussion regarding osteoarthritis and joint protection.  Trochanteric bursitis of both hips-doing better.  DDD (degenerative disc disease), cervical-she has some stiffness with range of motion of her cervical spine.  DDD (degenerative disc disease), lumbar - She has had epidural injections in the past performed by Dr. Ernestina Patches.  Subacromial impingement of left shoulder-chronic pain.  Fibromyalgia-she has generalized pain and discomfort and positive tender points.  Chronic pain syndrome  Osteopenia of multiple sites  Anxiety and depression  Chronic venous insufficiency  Thyroid nodule  Hormone replacement therapy (HRT)  History of gastroesophageal reflux (GERD)  History of hyperlipidemia  Hypothyroidism, unspecified type  History of IBS  Orders: Orders Placed This Encounter  Procedures  . [place] Extrem Up Bilat Comp   No orders of the defined types were placed in this encounter.    Follow-Up Instructions: Return in about 6 months (around 09/27/2019) for oa,fms.   Bo Merino, MD  Note - This record has been created using Editor, commissioning.   Chart creation errors have been sought, but may not always  have been located. Such creation errors do not reflect on  the standard of medical care.

## 2019-04-10 ENCOUNTER — Other Ambulatory Visit: Payer: Self-pay | Admitting: Internal Medicine

## 2019-04-10 MED ORDER — ESTRADIOL 0.5 MG PO TABS: 0.5000 mg | ORAL_TABLET | Freq: Every day | ORAL | 1 refills | Status: DC

## 2019-04-10 NOTE — Telephone Encounter (Signed)
Routing to CMA 

## 2019-04-10 NOTE — Telephone Encounter (Signed)
Requested medication (s) are due for refill today: yes  Requested medication (s) are on the active medication list: yes  Last refill:  01/18/2019  Future visit scheduled: yes  Notes to clinic:  Patient states that the dose is suppose to be 1mg  instead of 0.5mg .   Requested Prescriptions  Pending Prescriptions Disp Refills   estradiol (ESTRACE) 0.5 MG tablet 90 tablet 1    Sig: Take 1 tablet (0.5 mg total) by mouth daily.     OB/GYN:  Estrogens Failed - 04/10/2019  1:12 PM      Failed - Mammogram is up-to-date per Health Maintenance      Passed - Last BP in normal range    BP Readings from Last 1 Encounters:  03/29/19 128/68         Passed - Valid encounter within last 12 months    Recent Outpatient Visits          2 weeks ago [place], MD   1 month ago [place], Claudina Lick, MD   1 month ago Other migraine without status migrainosus, not intractable   [place], MD   2 months ago Need for influenza vaccination   [place], MD   2 months ago    [place], MD      Future Appointments            In 5 months Deveshwar, Abel Presto, MD [place]   In 5 months  [place], [place]   In 5 months Burns, Claudina Lick, MD [place], [place]

## 2019-04-10 NOTE — Telephone Encounter (Signed)
Medication Refill - Medication: estradiol (ESTRACE) 0.5 MG tablet  Per patient it is a 1MG  tablet. Not seen in patient's chart.   Has the patient contacted their pharmacy? Yes.   (Agent: If no, request that the patient contact the pharmacy for the refill.) (Agent: If yes, when and what did the pharmacy advise?)  Preferred Pharmacy (with phone number or street name): [facility], [place]: Please be advised that RX refills may take up to 3 business days. We ask that you follow-up with your pharmacy.

## 2019-04-24 DIAGNOSIS — R8279 Other abnormal findings on microbiological examination of urine: Secondary | ICD-10-CM | POA: Diagnosis not present

## 2019-04-24 DIAGNOSIS — R3911 Hesitancy of micturition: Secondary | ICD-10-CM | POA: Diagnosis not present

## 2019-04-26 DIAGNOSIS — Z961 Presence of intraocular lens: Secondary | ICD-10-CM | POA: Diagnosis not present

## 2019-04-26 DIAGNOSIS — H524 Presbyopia: Secondary | ICD-10-CM | POA: Diagnosis not present

## 2019-05-03 ENCOUNTER — Other Ambulatory Visit: Payer: Self-pay | Admitting: Internal Medicine

## 2019-05-03 DIAGNOSIS — F419 Anxiety disorder, unspecified: Secondary | ICD-10-CM

## 2019-05-03 NOTE — Telephone Encounter (Signed)
Last OV 03/24/19 Last RF 03/23/19

## 2019-05-05 ENCOUNTER — Other Ambulatory Visit: Payer: Self-pay | Admitting: Internal Medicine

## 2019-05-29 ENCOUNTER — Ambulatory Visit: Payer: Medicare Other | Admitting: Family Medicine

## 2019-05-29 ENCOUNTER — Ambulatory Visit (INDEPENDENT_AMBULATORY_CARE_PROVIDER_SITE_OTHER): Payer: Medicare Other

## 2019-05-29 ENCOUNTER — Ambulatory Visit: Payer: Self-pay

## 2019-05-29 ENCOUNTER — Other Ambulatory Visit: Payer: Self-pay

## 2019-05-29 ENCOUNTER — Encounter: Payer: Self-pay | Admitting: Family Medicine

## 2019-05-29 VITALS — BP 140/80 | HR 95 | Ht 60.0 in | Wt 124.0 lb

## 2019-05-29 DIAGNOSIS — M25511 Pain in right shoulder: Secondary | ICD-10-CM | POA: Diagnosis not present

## 2019-05-29 NOTE — Patient Instructions (Signed)
Thank you for coming in today. Do the exercises we reviewed.  If not improving we can get you set up with physical therapy. Get xray on your way home.  Return in about 1 month for recheck Call or go to the ER if you develop a large red swollen joint with extreme pain or oozing puss.

## 2019-05-29 NOTE — Progress Notes (Signed)
Rito Ehrlich, am serving as a Education administrator for Dr. Lynne Leader.  Laura Barnes is a 78 y.o. female who presents to [place] at [facility] today for constant sore R shoulder pain X4 weeks. States has bursitis in both shoulder, unable to reach arm behind back or above head.  Pain is located predominantly in the right lateral upper arm.  Pain is worse with overhead motion and reaching back.  Pain does radiate to the upper arm but not beyond the elbow.  She denies significant distal weakness or numbness.  Pain is moderate worse with activity and sometimes interfering with normal activities such as bathing and dressing.  She denies any injury.  She has tried some basic home exercises with little benefit.    ROS:  As above  Exam:  BP 140/80 (BP Location: Left Arm, Patient Position: Sitting, Cuff Size: Normal)   Pulse 95   Ht 5' (1.524 m)   Wt 124 lb (56.2 kg)   SpO2 100%   BMI 24.22 kg/m  Wt Readings from Last 5 Encounters:  05/29/19 124 lb (56.2 kg)  03/29/19 128 lb (58.1 kg)  03/24/19 125 lb 12.8 oz (57.1 kg)  02/20/19 128 lb 12.8 oz (58.4 kg)  02/10/19 129 lb (58.5 kg)   General: Well Developed, well nourished, and in no acute distress.  Neuro/Psych: Alert and oriented x3, extra-ocular muscles intact, able to move all 4 extremities, sensation grossly intact. Skin: Warm and dry, no rashes noted.  Respiratory: Not using accessory muscles, speaking in full sentences, trachea midline.  Cardiovascular: Pulses palpable, no extremity edema. Abdomen: Does not appear distended. MSK:  Right shoulder: Normal-appearing nontender. Range of motion: Passive abduction full.  Active abduction 120 degrees. Internal rotation to lumbar spine. Normal external rotation. Strength: Abduction 4/5.  External rotation 5/5.  Internal rotation 5/5. Positive Hawkins and Neer's test positive empty can test.  Left shoulder: Normal-appearing nontender. Range of motion full.   Internal rotation to thoracic spine. Strength: Abduction 4+/5. External rotation 4/5, internal rotation 5/5. Negative Hawkins and Neer's test.  Negative empty can test.  Pulses cap refill and sensation intact bilateral upper extremities.    Lab and Radiology Results  X-ray images right shoulder obtained today personally and independently reviewed Mild glenohumeral DJD.  Absent distal clavicle likely secondary to surgical excision.  No acute changes. Await formal radiology review  Procedure: Real-time Ultrasound Guided Injection of right subacromial bursa Device: Philips Affiniti 50G Images permanently stored and available for review in the ultrasound unit. Verbal informed consent obtained.  Discussed risks and benefits of procedure. Warned about infection bleeding damage to structures skin hypopigmentation and fat atrophy among others. Patient expresses understanding and agreement Time-out conducted.   Noted no overlying erythema, induration, or other signs of local infection.   Skin prepped in a sterile fashion.   Local anesthesia: Topical Ethyl chloride.   With sterile technique and under real time ultrasound guidance:  40 mg of Kenalog and 2 mL of Marcaine injected easily.   Completed without difficulty   Pain immediately resolved suggesting accurate placement of the medication.   Advised to call if fevers/chills, erythema, induration, drainage, or persistent bleeding.   Images permanently stored and available for review in the ultrasound unit.  Impression: Technically successful ultrasound guided injection.         Assessment and Plan: 78 y.o. female with right shoulder pain due to rotator cuff tendinopathy/subacromial bursitis.  Plan to treat with home exercise program as taught  by ATC and myself.  Additionally will proceed with injection as above.  Check back in about a month.  Return sooner if needed.  Would consider formal physical therapy however cost is an issue.   X-ray over read pending.   PDMP not reviewed this encounter. Orders Placed This Encounter  Procedures  . No Chg [place] upper Rt    Order Specific Question:   Reason for Exam (SYMPTOM  OR DIAGNOSIS REQUIRED)    Answer:   rt shoulder pain    Order Specific Question:   Preferred imaging location?    Answer:   [place]  . DG Shoulder Right    Standing Status:   Future    Standing Expiration Date:   07/29/2020    Order Specific Question:   Reason for Exam (SYMPTOM  OR DIAGNOSIS REQUIRED)    Answer:   right shhoulder pain    Order Specific Question:   Preferred imaging location?    Answer:   [facility]    Order Specific Question:   Radiology Contrast Protocol - do NOT remove file path    Answer:   \\[email or web address].pdf   No orders of the defined types were placed in this encounter.   Historical information moved to improve visibility of documentation.  Past Medical History:  Diagnosis Date  . ALLERGIC RHINITIS   . Allergy   . Anemia   . ANXIETY   . Arthritis    hands  . Bronchitis   . Chronic fatigue fibromyalgia syndrome   . Complication of anesthesia    says one time waking up she couldn't breathe, felt like her throat closing up  . DEPRESSION   . Enthesopathy of hip region   . Family history of anesthesia complication    sister with n/v  . FOOT PAIN, BILATERAL   . GERD   . Hiatal hernia   . HYPERLIPIDEMIA   . Hypothyroidism   . IBS   . MIGRAINE HEADACHE   . MIGRAINE, COMMON   . NECK MASS   . OSTEOPENIA   . OSTEOPOROSIS   . PLANTAR FASCIITIS   . RASH-NONVESICULAR   . SINUSITIS- ACUTE-NOS   . SKIN LESION   . VAGINITIS   . VENOUS INSUFFICIENCY, CHRONIC    Past Surgical History:  Procedure Laterality Date  . APPENDECTOMY    . BREAST ENHANCEMENT SURGERY    . BREAST IMPLANT REMOVAL    . CHOLECYSTECTOMY    . COLONOSCOPY    . FOOT SURGERY  11/06/2016  . MASTECTOMY     bilateral for severe  bilateral fibrocystic disease  . OOPHORECTOMY    . s/p neck lump removal    . SHOULDER SURGERY    . SINUS ENDO W/FUSION Right 06/30/2013   Procedure: RIGHT ENDOSCOPIC SPHENOIDECTOMY WITH FUSION SCAN;  Surgeon: [name], MD;  Location: [facility];  Service: ENT;  Laterality: Right;  . TONSILLECTOMY    . VAGINAL HYSTERECTOMY     Social History   Tobacco Use  . Smoking status: Never Smoker  . Smokeless tobacco: Never Used  Substance Use Topics  . Alcohol use: No   family history includes Breast cancer in her maternal grandmother; Diabetes in her father, mother, and sister; Healthy in her son; Heart disease in her father, maternal aunt, maternal uncle, and son; Irritable bowel syndrome in her son; Kidney disease in her paternal uncle.  Medications: Current Outpatient Medications  Medication Sig Dispense Refill  . 5-Hydroxytryptophan (5-HTP PO) Take 1 capsule by  mouth 2 (two) times daily.    Marland Kitchen acetaminophen (TYLENOL) 500 MG tablet Take 1,000 mg by mouth every 6 (six) hours as needed for mild pain, moderate pain, fever or headache.    . ALPRAZolam (XANAX) 0.5 MG tablet TAKE 1 TABLET(0.5 MG) BY MOUTH THREE TIMES DAILY AS NEEDED FOR ANXIETY 90 tablet 0  . AMBULATORY NON FORMULARY MEDICATION Walker 1 Device 0  . AMBULATORY NON FORMULARY MEDICATION Adjustable bed 1 Device 0  . Amino Acids (AMINO ACID PO) Take 1 capsule by mouth 2 (two) times daily.    . Ascorbic Acid (VITAMIN C) 1000 MG tablet Take 1,000 mg by mouth 2 (two) times daily.    . B Complex-Biotin-FA (B COMPLETE) TABS Take 1 tablet by mouth daily. B Complete 100mg     . buPROPion (WELLBUTRIN XL) 300 MG 24 hr tablet Take 1 tablet (300 mg total) by mouth daily. 90 tablet 1  . Cholecalciferol (VITAMIN D3) 1000 units CAPS Take 1,000 Units by mouth daily.    . Coenzyme Q10 (CO Q-10) 200 MG CAPS Take 1 capsule by mouth 2 (two) times daily.    Marland Kitchen ECHINACEA PO Take 1 tablet by mouth 2 (two) times daily.    Marland Kitchen ELDERBERRY PO Take by mouth  daily.    Marland Kitchen estradiol (ESTRACE) 0.5 MG tablet Take 1 tablet (0.5 mg total) by mouth daily. 90 tablet 1  . GOLDEN SEAL PO Take by mouth daily.    Nyoka Cowden Tea, Camellia sinensis, (EGCG) POWD Take 1 capsule by mouth daily.    . Homeopathic Products (ARNICA [place]) PLLT Take by mouth daily.    Marland Kitchen KRILL OIL PO Take by mouth daily.    Marland Kitchen LECITHIN PO Take 1 tablet by mouth daily.    Marland Kitchen levothyroxine (SYNTHROID) 75 MCG tablet TAKE 1 TABLET BY MOUTH EVERY DAY IN THE MORNING 90 tablet 1  . MAGNESIUM ASPARTATE PO Take 1 tablet by mouth 2 (two) times daily.    . meclizine (ANTIVERT) 25 MG tablet Take 1 tablet (25 mg total) by mouth 3 (three) times daily as needed for dizziness. 60 tablet 5  . Misc. Devices (ROLLATOR) MISC Use rollator for ambulation 1 each 0  . Multiple Vitamins-Minerals (ZINC PO) Take by mouth daily.    . Omega-3 Fatty Acids (SALMON OIL-1000 PO) Take 1,000 mg by mouth daily.    . ondansetron (ZOFRAN) 4 MG tablet TAKE 1 TABLET(4 MG) BY MOUTH EVERY 8 HOURS AS NEEDED FOR NAUSEA OR VOMITING 30 tablet 0  . ondansetron (ZOFRAN-ODT) 8 MG disintegrating tablet Take 1 tablet (8 mg total) by mouth every 8 (eight) hours as needed for nausea or vomiting. 30 tablet 2  . POTASSIUM AMINOBENZOATE PO Take 1 tablet by mouth daily.    . prochlorperazine (COMPAZINE) 10 MG tablet Take 1 tablet (10 mg total) by mouth every 6 (six) hours as needed for nausea or vomiting. 30 tablet 0  . triamterene-hydrochlorothiazide (MAXZIDE-25) 37.5-25 MG tablet TAKE 1 TABLET BY MOUTH DAILY 90 tablet 1  . TURMERIC PO Take 1 tablet by mouth 2 (two) times daily.     . vitamin E 400 UNIT capsule Take 400 Units by mouth daily.     No current facility-administered medications for this visit.   Allergies  Allergen Reactions  . Prednisone Anaphylaxis    Patient unable to remember why she needed to steroid shot but just knows that when she got back to work she started having a lot of trouble breathing.  (jkl 05/04/14)  Multiple  epidural steroid injections with depomedrol with no issues. (05/04/18)  . Amoxicillin-Pot Clavulanate Hives and Diarrhea    Has patient had a PCN reaction causing immediate rash, facial/tongue/throat swelling, SOB or lightheadedness with hypotension: Unknown Has patient had a PCN reaction causing severe rash involving mucus membranes or skin necrosis: Unknown Has patient had a PCN reaction that required hospitalization: Unknown Has patient had a PCN reaction occurring within the last 10 years: Unknown If all of the above answers are "NO", then may proceed with Cephalosporin use.   . Aspirin Other (See Comments)    Stomach ache and bleed  . Erythromycin Diarrhea  . Levofloxacin Other (See Comments)    Headaches, GI upset  . Nsaids Other (See Comments)    stomach bleeding per pt  . Statins Nausea Only and Other (See Comments)    Headache, upset stomach, joint hurt, heartburn  . Sumatriptan Hives and Palpitations  . Adhesive [Tape] Other (See Comments)    blisters  . Ivp Dye [Iodinated Diagnostic Agents]     If made with blue shellfish then CAN NOT have this type of dye.  Pt was given contrast 10/12/17, not premedicated, had NO COMPLICATIONS- BLO, CT  . Methylphenidate Hcl     Unknown reaction  . Mirtazapine     Unknown reaction  . Red Yeast Rice [Cholestin] Other (See Comments)    Pt reports causes aching pains like statins do  . Shellfish Allergy     Blue shellfish  . Soy Allergy     Unknown reaction  . Doxycycline Diarrhea  . Hydrocodone Itching  . Hydrocodone-Acetaminophen Itching  . Latex Other (See Comments)    blisters  . Rofecoxib Nausea Only  . Sertraline Hcl Nausea Only  . Sulfonamide Derivatives Itching      Discussed warning signs or symptoms. Please see discharge instructions. Patient expresses understanding.  The above documentation has been reviewed and is accurate and complete Lynne Leader

## 2019-05-30 NOTE — Progress Notes (Signed)
X-ray shoulder shows no fractures or severe deformity or arthritis.

## 2019-06-04 ENCOUNTER — Other Ambulatory Visit: Payer: Self-pay | Admitting: Internal Medicine

## 2019-06-04 DIAGNOSIS — F419 Anxiety disorder, unspecified: Secondary | ICD-10-CM

## 2019-06-06 NOTE — Telephone Encounter (Signed)
Check [place] registry last filled 05/04/2019.Marland KitchenJohny Chess

## 2019-06-26 ENCOUNTER — Ambulatory Visit: Payer: Medicare Other | Admitting: Family Medicine

## 2019-06-26 ENCOUNTER — Encounter: Payer: Self-pay | Admitting: Family Medicine

## 2019-06-26 ENCOUNTER — Other Ambulatory Visit: Payer: Self-pay

## 2019-06-26 VITALS — BP 130/88 | HR 104 | Ht 60.0 in | Wt 128.0 lb

## 2019-06-26 DIAGNOSIS — M25511 Pain in right shoulder: Secondary | ICD-10-CM

## 2019-06-26 NOTE — Patient Instructions (Addendum)
Thank you for coming in today. Work on the exercise we discussed.  Work with Dr Ernestina Patches to get your back settled down again.  If not improving let me know and I will order a MRI.  Recheck following MRI.  Let me know if you have any questions or issues.   Please perform the exercise program that we have prepared for you and gone over in detail on a daily basis.  In addition to the handout you were provided you can access your program through: [email or web address]   Your unique program code is:  [email or web address]

## 2019-06-26 NOTE — Progress Notes (Signed)
   I, [name], LAT, ATC, am serving as scribe for Dr. Lynne Leader.  Laura Barnes is a 79 y.o. female who presents to [place] at [facility] today for f/u R shoulder pain x 2 months.  Pt was last seen by Dr. Georgina Snell on 05/29/19 and had a R subacromial injection.  At that time she was having pain radiating into her R lateral upper arm that was aggrvated w/ overhead ROM and functional IR.  Since her last visit, pt reports that her R shoulder felt better following the injection until about 4-5 days ago.  She states that she has been doing chores around the house and notes that she is getting some intermittent popping in her R shoulder.  She states that her R shoulder feels bruised.  She notes her back pain has been worsening.  She has had back injections or ablations in the past and thinks that the injections are wearing off.  She is in the process of scheduling back with her chronic pain specialist to proceed with repeat injections if possible.  She does that she is not been able to do the home exercises much because of her worsening back pain.   Pertinent review of systems: Worsening back pain  Relevant historical information: Chronic back pain.   Exam:  BP 130/88 (BP Location: Left Arm, Patient Position: Sitting, Cuff Size: Normal)   Pulse (!) 104   Ht 5' (1.524 m)   Wt 128 lb (58.1 kg)   SpO2 97%   BMI 25.00 kg/m  General: Well Developed, well nourished, and in no acute distress.   MSK: Right shoulder: Normal-appearing. Normal motion popping with abduction. Normal strength.    Lab and Radiology Results  EXAM: RIGHT SHOULDER - 2+ VIEW 05/29/19  COMPARISON:  None.  FINDINGS: No fracture or dislocation is seen.  The joint spaces are preserved.  The visualized soft tissues are unremarkable.  Visualized right lung is clear.  IMPRESSION: Negative.   Electronically Signed   By: Julian Hy M.D.   On: 05/29/2019 16:18  I, Lynne Leader, personally (independently) visualized and performed the interpretation of the images attached in this note.     Assessment and Plan: 79 y.o. female with right shoulder pain.  Improvement following injection however pain is returning a bit.  She has not been able to do the home exercise program due to her back pain worsening. Ideally physical therapy would be very helpful but she cannot afford it at this time. Modified home exercise program today and will proceed with exercise program that is not dependent on back motion is much.  Additionally patient will follow up with her pain management specialist for repeat injections.  I am hopeful this will help settle her back down a bit so should be able to focus more on her shoulder.  If not any better next step is MRI for surgical or further injection planning.  Patient will notify me and I will order MRI if not improved.    Discussed warning signs or symptoms. Please see discharge instructions. Patient expresses understanding.   The above documentation has been reviewed and is accurate and complete Lynne Leader     Total encounter time 20 minutes including charting time date of service.

## 2019-06-27 ENCOUNTER — Telehealth: Payer: Self-pay | Admitting: Physical Medicine and Rehabilitation

## 2019-06-27 ENCOUNTER — Encounter: Payer: Self-pay | Admitting: Physical Medicine and Rehabilitation

## 2019-06-27 NOTE — Telephone Encounter (Signed)
Left message #1

## 2019-06-27 NOTE — Telephone Encounter (Signed)
ok 

## 2019-06-28 NOTE — Telephone Encounter (Signed)
Scheduled for 2/8 with driver and no blood thinners.

## 2019-06-30 ENCOUNTER — Other Ambulatory Visit: Payer: Self-pay | Admitting: Internal Medicine

## 2019-07-05 ENCOUNTER — Other Ambulatory Visit: Payer: Self-pay | Admitting: Cardiology

## 2019-07-07 ENCOUNTER — Encounter: Payer: Self-pay | Admitting: Cardiology

## 2019-07-07 ENCOUNTER — Ambulatory Visit: Payer: Medicare Other | Admitting: Cardiology

## 2019-07-07 ENCOUNTER — Other Ambulatory Visit: Payer: Self-pay

## 2019-07-07 VITALS — BP 122/74 | HR 88 | Ht 60.0 in | Wt 128.4 lb

## 2019-07-07 DIAGNOSIS — R0609 Other forms of dyspnea: Secondary | ICD-10-CM

## 2019-07-07 DIAGNOSIS — I2510 Atherosclerotic heart disease of native coronary artery without angina pectoris: Secondary | ICD-10-CM | POA: Diagnosis not present

## 2019-07-07 DIAGNOSIS — E785 Hyperlipidemia, unspecified: Secondary | ICD-10-CM

## 2019-07-07 DIAGNOSIS — R600 Localized edema: Secondary | ICD-10-CM | POA: Diagnosis not present

## 2019-07-07 DIAGNOSIS — R0600 Dyspnea, unspecified: Secondary | ICD-10-CM

## 2019-07-07 NOTE — Progress Notes (Signed)
Cardiology Office Note    Date:  07-26-2019   ID:  Laura Barnes, DOB [birth year], MRN [id number]  PCP:  Binnie Rail, MD  Cardiologist:  Ena Dawley, MD   Reason for visit: 1 year follow-up  History of Present Illness:  Laura Barnes is a 79 y.o. female was a very sweet patient, wife of my previous patient Mr. Laura Barnes who recently passed away secondary to cancer. She was seen for DOE in 07/2017 and underwent coronary CTA that showed coronary calcium score of 18. This was 82 percentile for age and sex matched control. 2. Normal coronary origin with right dominance.  Minimal non-obstructive CAD. She was previously evaluated by pulmonary with no significant finding on pulmonary function test or chest CT. She has previously been tried on different statins including atorvastatin, rosuvastatin and including red yeast rice and they all caused significant muscle pain.she has been under a lot of stress with her husband's terminal disease and death.  2019-07-26, the patient is coming after 1 year, she has been doing well from cardiac standpoint, she denies any chest pain, no palpitation dizziness or syncope.  She has chronic dyspnea on exertion and is being followed by pulmonary specialist.  She now feels slightly more short of breath as she is also having an episode of acute sinusitis.  She has been compliant with her medications and has no side effects.  Past Medical History:  Diagnosis Date  . ALLERGIC RHINITIS   . Allergy   . Anemia   . ANXIETY   . Arthritis    hands  . Bronchitis   . Chronic fatigue fibromyalgia syndrome   . Complication of anesthesia    says one time waking up she couldn't breathe, felt like her throat closing up  . DEPRESSION   . Enthesopathy of hip region   . Family history of anesthesia complication    sister with n/v  . FOOT PAIN, BILATERAL   . GERD   . Hiatal hernia   . HYPERLIPIDEMIA   . Hypothyroidism   . IBS   . MIGRAINE HEADACHE     . MIGRAINE, COMMON   . NECK MASS   . OSTEOPENIA   . OSTEOPOROSIS   . PLANTAR FASCIITIS   . RASH-NONVESICULAR   . SINUSITIS- ACUTE-NOS   . SKIN LESION   . VAGINITIS   . VENOUS INSUFFICIENCY, CHRONIC     Past Surgical History:  Procedure Laterality Date  . APPENDECTOMY    . BREAST ENHANCEMENT SURGERY    . BREAST IMPLANT REMOVAL    . CHOLECYSTECTOMY    . COLONOSCOPY    . FOOT SURGERY  11/06/2016  . MASTECTOMY     bilateral for severe bilateral fibrocystic disease  . OOPHORECTOMY    . s/p neck lump removal    . SHOULDER SURGERY    . SINUS ENDO W/FUSION Right 06/30/2013   Procedure: RIGHT ENDOSCOPIC SPHENOIDECTOMY WITH FUSION SCAN;  Surgeon: [name], MD;  Location: [facility];  Service: ENT;  Laterality: Right;  . TONSILLECTOMY    . VAGINAL HYSTERECTOMY      Current Medications: Outpatient Medications Prior to Visit  Medication Sig Dispense Refill  . 5-Hydroxytryptophan (5-HTP PO) Take 1 capsule by mouth 2 (two) times daily.    Marland Kitchen acetaminophen (TYLENOL) 500 MG tablet Take 1,000 mg by mouth every 6 (six) hours as needed for mild pain, moderate pain, fever or headache.    . ALPRAZolam (XANAX) 0.5 MG tablet TAKE 1 TABLET(0.5 MG)  BY MOUTH THREE TIMES DAILY AS NEEDED FOR ANXIETY 90 tablet 0  . AMBULATORY NON FORMULARY MEDICATION Walker 1 Device 0  . AMBULATORY NON FORMULARY MEDICATION Adjustable bed 1 Device 0  . Amino Acids (AMINO ACID PO) Take 1 capsule by mouth 2 (two) times daily.    . Ascorbic Acid (VITAMIN C) 1000 MG tablet Take 1,000 mg by mouth 2 (two) times daily.    . B Complex-Biotin-FA (B COMPLETE) TABS Take 1 tablet by mouth daily. B Complete 100mg     . benzonatate (TESSALON) 200 MG capsule Take 200 mg by mouth 3 (three) times daily as needed.    Marland Kitchen buPROPion (WELLBUTRIN XL) 300 MG 24 hr tablet TAKE 1 TABLET(300 MG) BY MOUTH DAILY 90 tablet 1  . Cholecalciferol (VITAMIN D3) 1000 units CAPS Take 1,000 Units by mouth daily.    . Coenzyme Q10 (CO Q-10) 200 MG CAPS Take  1 capsule by mouth 2 (two) times daily.    Marland Kitchen ECHINACEA PO Take 1 tablet by mouth 2 (two) times daily.    Marland Kitchen ELDERBERRY PO Take by mouth daily.    Marland Kitchen estradiol (ESTRACE) 0.5 MG tablet Take 1 tablet (0.5 mg total) by mouth daily. 90 tablet 1  . GOLDEN SEAL PO Take by mouth daily.    Nyoka Cowden Tea, Camellia sinensis, (EGC[employer]) POWD Take 1 capsule by mouth daily.    . Homeopathic Products (ARNICA [place]) PLLT Take by mouth daily.    Marland Kitchen KRILL OIL PO Take by mouth daily.    Marland Kitchen LECITHIN PO Take 1 tablet by mouth daily.    Marland Kitchen levothyroxine (SYNTHROID) 75 MCG tablet TAKE 1 TABLET BY MOUTH EVERY DAY IN THE MORNING 90 tablet 1  . MAGNESIUM ASPARTATE PO Take 1 tablet by mouth 2 (two) times daily.    . meclizine (ANTIVERT) 25 MG tablet Take 1 tablet (25 mg total) by mouth 3 (three) times daily as needed for dizziness. 60 tablet 5  . Misc. Devices (ROLLATOR) MISC Use rollator for ambulation 1 each 0  . Multiple Vitamins-Minerals (ZINC PO) Take by mouth daily.    . Omega-3 Fatty Acids (SALMON OIL-1000 PO) Take 1,000 mg by mouth daily.    . ondansetron (ZOFRAN) 4 MG tablet TAKE 1 TABLET(4 MG) BY MOUTH EVERY 8 HOURS AS NEEDED FOR NAUSEA OR VOMITING 30 tablet 0  . ondansetron (ZOFRAN-ODT) 8 MG disintegrating tablet Take 1 tablet (8 mg total) by mouth every 8 (eight) hours as needed for nausea or vomiting. 30 tablet 2  . POTASSIUM AMINOBENZOATE PO Take 1 tablet by mouth daily.    . prochlorperazine (COMPAZINE) 10 MG tablet Take 1 tablet (10 mg total) by mouth every 6 (six) hours as needed for nausea or vomiting. 30 tablet 0  . triamterene-hydrochlorothiazide (MAXZIDE-25) 37.5-25 MG tablet TAKE 1 TABLET BY MOUTH DAILY 90 tablet 1  . TURMERIC PO Take 1 tablet by mouth 2 (two) times daily.     . vitamin E 400 UNIT capsule Take 400 Units by mouth daily.     No facility-administered medications prior to visit.     Allergies:   Prednisone, Amoxicillin-pot clavulanate, Aspirin, Erythromycin, Levofloxacin, Nsaids, Statins,  Sumatriptan, Adhesive [tape], Ivp dye [iodinated diagnostic agents], Methylphenidate hcl, Mirtazapine, Red yeast rice [cholestin], Shellfish allergy, Soy allergy, Doxycycline, Hydrocodone, Hydrocodone-acetaminophen, Latex, Rofecoxib, Sertraline hcl, and Sulfonamide derivatives   Social History   Socioeconomic History  . Marital status: Widowed    Spouse name: Not on file  . Number of children: 2  .  Years of education: Not on file  . Highest education level: Not on file  Occupational History  . Occupation: retired Press photographer  Tobacco Use  . Smoking status: Never Smoker  . Smokeless tobacco: Never Used  Substance and Sexual Activity  . Alcohol use: No  . Drug use: No  . Sexual activity: Not Currently  Other Topics Concern  . Not on file  Social History Narrative   Has 2 biological children and 1 step child   Social Determinants of Health   Financial Resource Strain:   . Difficulty of Paying Living Expenses: Not on file  Food Insecurity:   . Worried About Charity fundraiser in the Last Year: Not on file  . Ran Out of Food in the Last Year: Not on file  Transportation Needs:   . Lack of Transportation (Medical): Not on file  . Lack of Transportation (Non-Medical): Not on file  Physical Activity:   . Days of Exercise per Week: Not on file  . Minutes of Exercise per Session: Not on file  Stress:   . Feeling of Stress : Not on file  Social Connections: Unknown  . Frequency of Communication with Friends and Family: Not on file  . Frequency of Social Gatherings with Friends and Family: Not on file  . Attends Religious Services: Not on file  . Active Member of Clubs or Organizations: Yes  . Attends Archivist Meetings: Not on file  . Marital Status: Not on file     Family History:  The patient's family history includes Breast cancer in her maternal grandmother; Diabetes in her father, mother, and sister; Healthy in her son; Heart disease in her father, maternal aunt,  maternal uncle, and son; Irritable bowel syndrome in her son; Kidney disease in her paternal uncle.   ROS:   Please see the history of present illness.    ROS All other systems reviewed and are negative.  PHYSICAL EXAM:   VS:  BP 122/74   Pulse 88   Ht 5' (1.524 m)   Wt 128 lb 6.4 oz (58.2 kg)   SpO2 97%   BMI 25.08 kg/m    GEN: Well nourished, well developed, in no acute distress  HEENT: normal  Neck: no JVD, carotid bruits, or masses Cardiac: RRR; no murmurs, rubs, or gallops,mild B/L edema  Respiratory:  clear to auscultation bilaterally, normal work of breathing GI: soft, nontender, nondistended, + BS MS: no deformity or atrophy  Skin: warm and dry, no rash Neuro:  Alert and Oriented x 3, Strength and sensation are intact Psych: euthymic mood, full affect  Wt Readings from Last 3 Encounters:  07/07/19 128 lb 6.4 oz (58.2 kg)  06/26/19 128 lb (58.1 kg)  05/29/19 124 lb (56.2 kg)    Studies/Labs Reviewed:   EKG:  EKG is ordered today.  The ekg ordered today demonstrates Normal sinus rhythm, normal EKG.  Recent Labs: 12/30/2018: ALT 18 03/24/2019: BUN 12; Creatinine, Ser 0.90; Hemoglobin 13.2; Platelets 225.0; Potassium 3.5; Sodium 137; TSH 1.18   Lipid Panel    Component Value Date/Time   CHOL 202 (H) 07/25/2018 1151   TRIG 97 07/25/2018 1151   HDL 78 07/25/2018 1151   CHOLHDL 2.6 07/25/2018 1151   CHOLHDL 2 09/13/2017 1515   VLDL 12.6 09/13/2017 1515   LDLCALC 105 (H) 07/25/2018 1151   LDLDIRECT 119.6 04/06/2013 1228    Additional studies/ records that were reviewed today include:   Coronary CTA 09/2017 1. Coronary calcium score  of 63. This was 69 percentile for age and sex matched control. 2. Normal coronary origin with right dominance. 3.  Minimal non-obstructive CAD.   08/27/2017  Left ventricle: The cavity size was normal. There was severe   focal basal and mild concentric hypertrophy. Systolic function   was normal. The estimated ejection fraction  was in the range of   60% to 65%. Wall motion was normal; there were no regional wall   motion abnormalities. There was an increased relative   contribution of atrial contraction to ventricular filling.   Doppler parameters are consistent with abnormal left ventricular   relaxation (grade 1 diastolic dysfunction). - Pulmonary arteries: Systolic pressure could not be accurately   estimated.   ASSESSMENT:    1. Hyperlipidemia, unspecified hyperlipidemia type   2. Coronary artery disease involving native coronary artery of native heart without angina pectoris   3. DOE (dyspnea on exertion)   4. Lower extremity edema    PLAN:  In order of problems listed above:  1. Minimal non-obstructive coronary artery disease on coronary CTA in 07/2017.  All lipids are at goal, she is advised to continue with healthy diet, anti-inflammatory supplements and fish oil.  EKG today is completely normal and unchanged from prior.  No ischemic work-up is necessary.   2. Hyperlipidemia -as above.   3. Dyspnea on exertion - seen by pulmonary  4. Dizziness -bilateral carotid ultrasound showed minimal disease.   Medication Adjustments/Labs and Tests Ordered: Current medicines are reviewed at length with the patient today.  Concerns regarding medicines are outlined above.  Medication changes, Labs and Tests ordered today are listed in the Patient Instructions below. Patient Instructions  Medication Instructions:   Your physician recommends that you continue on your current medications as directed. Please refer to the Current Medication list given to you today.  *If you need a refill on your cardiac medications before your next appointment, please call your pharmacy*    Follow-Up: At [facility], you and your health needs are our priority.  As part of our continuing mission to provide you with exceptional heart care, we have created designated Provider Care Teams.  These Care Teams include your primary  Cardiologist (physician) and Advanced Practice Providers (APPs -  Physician Assistants and Nurse Practitioners) who all work together to provide you with the care you need, when you need it.  Your next appointment:   12 month(s)  The format for your next appointment:   In Person  Provider:   Ena Dawley, MD       Signed, Ena Dawley, MD  07/07/2019 3:54 PM    [address], [place], [place]  [address] Phone: [phone]; Fax: [phone]

## 2019-07-07 NOTE — Patient Instructions (Signed)
Medication Instructions:   Your physician recommends that you continue on your current medications as directed. Please refer to the Current Medication list given to you today.  *If you need a refill on your cardiac medications before your next appointment, please call your pharmacy*    Follow-Up: At [facility], you and your health needs are our priority.  As part of our continuing mission to provide you with exceptional heart care, we have created designated Provider Care Teams.  These Care Teams include your primary Cardiologist (physician) and Advanced Practice Providers (APPs -  Physician Assistants and Nurse Practitioners) who all work together to provide you with the care you need, when you need it.  Your next appointment:   12 month(s)  The format for your next appointment:   In Person  Provider:   Katarina Nelson, MD   

## 2019-07-10 ENCOUNTER — Ambulatory Visit: Payer: Medicare Other | Admitting: Physical Medicine and Rehabilitation

## 2019-07-10 ENCOUNTER — Encounter: Payer: Self-pay | Admitting: Physical Medicine and Rehabilitation

## 2019-07-10 ENCOUNTER — Ambulatory Visit: Payer: Self-pay

## 2019-07-10 ENCOUNTER — Other Ambulatory Visit: Payer: Self-pay

## 2019-07-10 VITALS — BP 139/81 | HR 110

## 2019-07-10 DIAGNOSIS — M5416 Radiculopathy, lumbar region: Secondary | ICD-10-CM | POA: Diagnosis not present

## 2019-07-10 DIAGNOSIS — M419 Scoliosis, unspecified: Secondary | ICD-10-CM

## 2019-07-10 DIAGNOSIS — M5116 Intervertebral disc disorders with radiculopathy, lumbar region: Secondary | ICD-10-CM

## 2019-07-10 DIAGNOSIS — M48061 Spinal stenosis, lumbar region without neurogenic claudication: Secondary | ICD-10-CM

## 2019-07-10 MED ORDER — METHYLPREDNISOLONE ACETATE 80 MG/ML IJ SUSP
80.0000 mg | Freq: Once | INTRAMUSCULAR | Status: AC
  Administered 2019-07-10: 14:00:00 80 mg

## 2019-07-10 NOTE — Progress Notes (Signed)
   Numeric Pain Rating Scale and Functional Assessment Average Pain (3)   In the last MONTH (on 0-10 scale) has pain interfered with the following?  1. General activity like being  able to carry out your everyday physical activities such as walking, climbing stairs, carrying groceries, or moving a chair?  Rating(12)   +Driver, -BT, -Dye Allergies.

## 2019-07-11 DIAGNOSIS — M48061 Spinal stenosis, lumbar region without neurogenic claudication: Secondary | ICD-10-CM | POA: Insufficient documentation

## 2019-07-11 DIAGNOSIS — J310 Chronic rhinitis: Secondary | ICD-10-CM | POA: Diagnosis not present

## 2019-07-11 DIAGNOSIS — J0141 Acute recurrent pansinusitis: Secondary | ICD-10-CM | POA: Diagnosis not present

## 2019-07-11 DIAGNOSIS — M419 Scoliosis, unspecified: Secondary | ICD-10-CM | POA: Insufficient documentation

## 2019-07-11 NOTE — Procedures (Signed)
Lumbar Epidural Steroid Injection - Interlaminar Approach with Fluoroscopic Guidance  Patient: Laura Barnes      Date of Birth: 1941/03/21 MRN: [id number] PCP: Binnie Rail, MD      Visit Date: 07/10/2019   Universal Protocol:     Consent Given By: the patient  Position: PRONE  Additional Comments: Vital signs were monitored before and after the procedure. Patient was prepped and draped in the usual sterile fashion. The correct patient, procedure, and site was verified.   Injection Procedure Details:  Procedure Site One Meds Administered:  Meds ordered this encounter  Medications  . methylPREDNISolone acetate (DEPO-MEDROL) injection 80 mg     Laterality: Left  Location/Site:  L4-L5  Needle size: 20 G  Needle type: Tuohy  Needle Placement: Paramedian epidural  Findings:   -Comments: Excellent flow of contrast into the epidural space.  Initial placement at L3-4 was very difficult that we kept trying to get access to the epidural space but there was a lot of bony osteophytic changes around that area.  Despite multiple attempts we ended up completing the injection 1 level lower at L4-5.  Would consider transforaminal approach depending on how she does.  Procedure Details: Using a paramedian approach from the side mentioned above, the region overlying the inferior lamina was localized under fluoroscopic visualization and the soft tissues overlying this structure were infiltrated with 4 ml. of 1% Lidocaine without Epinephrine. The Tuohy needle was inserted into the epidural space using a paramedian approach.   The epidural space was localized using loss of resistance along with lateral and bi-planar fluoroscopic views.  After negative aspirate for air, blood, and CSF, a 2 ml. volume of Isovue-250 was injected into the epidural space and the flow of contrast was observed. Radiographs were obtained for documentation purposes.    The injectate was administered into the  level noted above.   Additional Comments:  The patient tolerated the procedure well Dressing: 2 x 2 sterile gauze and Band-Aid    Post-procedure details: Patient was observed during the procedure. Post-procedure instructions were reviewed.  Patient left the clinic in stable condition.

## 2019-07-11 NOTE — Progress Notes (Signed)
Laura Barnes - 79 y.o. female MRN [id number]  Date of birth: 1940/12/21  Office Visit Note: Visit Date: 07/10/2019 PCP: Binnie Rail, MD Referred by: Binnie Rail, MD  Subjective: Chief Complaint  Patient presents with  . Lower Back - Pain   HPI: Laura Barnes is a 79 y.o. female who comes in today For planned repeat L3-4 interlaminar dural steroid injection.  The last time I saw her was in August completed injection at that time that seemed to give her quite a bit of relief up until recently.  She has now had more than 6 weeks of worsening symptoms in the low back a little bit more left than right-sided at this point compared to prior.  No new injuries or trauma.  She has a history of multiple joint arthritis and all over body pain with history of fibromyalgia.  She continues to follow with Dr. Cy Blamer and rheumatology and she has been seeing a sports medicine physician for her shoulder more recently.  She has multiple drug allergies and intolerances.  She does endorse a high pain tolerance.  In terms of her allergies she lists prednisone as anaphylactic reaction but she has had multiple cortisone injections in particular lumbar spine epidural injections with Depo-Medrol with no history of any allergic reaction at all.  She also reports contrast allergy but only comment something about a blue seafood dye.  We have used contrast without any issues as well.  She is having some pain into the buttock area.  Prior MRI reviewed again below from 2019 shows disc herniation at L2-3 with right more than left lateral recess stenosis.  She has multilevel degenerative facet arthropathy and scoliosis.  ROS Otherwise per HPI.  Assessment & Plan: Visit Diagnoses:  1. Lumbar radiculopathy   2. Radiculopathy due to lumbar intervertebral disc disorder   3. Bilateral stenosis of lateral recess of lumbar spine   4. Scoliosis of thoracolumbar spine, unspecified scoliosis type     Plan:  No additional findings.   Meds & Orders:  Meds ordered this encounter  Medications  . methylPREDNISolone acetate (DEPO-MEDROL) injection 80 mg    Orders Placed This Encounter  Procedures  . XR C-ARM NO REPORT  . Epidural Steroid injection    Follow-up: Return if symptoms worsen or fail to improve.   Procedures: No procedures performed  Lumbar Epidural Steroid Injection - Interlaminar Approach with Fluoroscopic Guidance  Patient: Laura Barnes      Date of Birth: 1941/02/20 MRN: [id number] PCP: Binnie Rail, MD      Visit Date: 07/10/2019   Universal Protocol:     Consent Given By: the patient  Position: PRONE  Additional Comments: Vital signs were monitored before and after the procedure. Patient was prepped and draped in the usual sterile fashion. The correct patient, procedure, and site was verified.   Injection Procedure Details:  Procedure Site One Meds Administered:  Meds ordered this encounter  Medications  . methylPREDNISolone acetate (DEPO-MEDROL) injection 80 mg     Laterality: Left  Location/Site:  L4-L5  Needle size: 20 G  Needle type: Tuohy  Needle Placement: Paramedian epidural  Findings:   -Comments: Excellent flow of contrast into the epidural space.  Initial placement at L3-4 was very difficult that we kept trying to get access to the epidural space but there was a lot of bony osteophytic changes around that area.  Despite multiple attempts we ended up completing the injection 1 level lower at  L4-5.  Would consider transforaminal approach depending on how she does.  Procedure Details: Using a paramedian approach from the side mentioned above, the region overlying the inferior lamina was localized under fluoroscopic visualization and the soft tissues overlying this structure were infiltrated with 4 ml. of 1% Lidocaine without Epinephrine. The Tuohy needle was inserted into the epidural space using a paramedian approach.   The epidural  space was localized using loss of resistance along with lateral and bi-planar fluoroscopic views.  After negative aspirate for air, blood, and CSF, a 2 ml. volume of Isovue-250 was injected into the epidural space and the flow of contrast was observed. Radiographs were obtained for documentation purposes.    The injectate was administered into the level noted above.   Additional Comments:  The patient tolerated the procedure well Dressing: 2 x 2 sterile gauze and Band-Aid    Post-procedure details: Patient was observed during the procedure. Post-procedure instructions were reviewed.  Patient left the clinic in stable condition.    Clinical History: MRI LUMBAR SPINE WITHOUT CONTRAST  TECHNIQUE: Multiplanar, multisequence MR imaging of the lumbar spine was performed. No intravenous contrast was administered.  COMPARISON:  Multiple exams, including 04/14/2010  FINDINGS: Segmentation: The lowest lumbar type non-rib-bearing vertebra is labeled as L5.  Alignment: Levoconvex lumbar scoliosis centered at the L3 level. The mild rotary component. 5 mm of degenerative retrolisthesis at L2-3 with 4 mm degenerative anterolisthesis at L3-4 and 3 mm degenerative anterolisthesis at L4-5.  Vertebrae: Type 3 degenerative endplate findings at X33443 and type 2 degenerative endplate findings at X33443 and L5-S1 with considerable loss of intervertebral disc height at all 3 levels. Facet and perifacet edema on the left at L5-S1.  Conus medullaris and cauda equina: Conus extends to the L1 level. Conus and cauda equina appear normal.  Paraspinal and other soft tissues: A fluid signal intensity lesion of the right kidney is partially included on today's exam and statistically likely to represent a cyst although technically nonspecific.  Disc levels:  T11-12: No impingement.  Small right perineural cyst.  T12-L1: No impingement.  Bilateral perineural cysts.  L1-2: No impingement.   Mild disc bulge.  Bilateral perineural cysts.  L2-3: Prominent right foraminal stenosis with prominent right subarticular lateral recess stenosis and moderate right eccentric central narrowing of the thecal sac, with moderate right and mild left displacement of the L2 nerves in the lateral extraforaminal space, due to a large right lateral recess and foraminal disc protrusion, facet arthropathy, intervertebral spurring, and the degenerative retrolisthesis at this level. The disc protrusion extends caudad about 1.3 cm in the right lateral recess, and this disc protrusion is shown on image 9/6.  L3-4: Moderate right and mild left foraminal stenosis with borderline right subarticular lateral recess stenosis due to disc uncovering, facet spurring, and intervertebral spurring  L4-5: Mild bilateral subarticular lateral recess stenosis due to disc bulge, facet arthropathy, and ligamentum flavum redundancy. Small bilateral facet joint effusions.  L5-S1: Borderline bilateral subarticular lateral recess stenosis due to disc osteophyte complex. Small left paracentral disc protrusion.  IMPRESSION: 1. Lumbar spondylosis, scoliosis, and degenerative disc disease with multilevel new subluxations the compared to the prior lumbar MRI. The dominant finding is a large right lateral recess and foraminal disc protrusion at the L2-3 level, extending caudad. These factors result in prominent impingement at L2-3; moderate impingement at L3-4; and mild impingement at L4-5, as detailed above.   Electronically Signed   By: Van Clines M.D.   On: 07/25/2017 17:39  She reports that she has never smoked. She has never used smokeless tobacco.  Recent Labs    08/09/18 1135  LABURIC 2.4*    Objective:  VS:  HT:    WT:   BMI:     BP:139/81  HR:(!) 110bpm  TEMP: ( )  RESP:  Physical Exam  Ortho Exam Imaging: XR C-ARM NO REPORT  Result Date: 07/10/2019 Please see Notes tab for  imaging impression.   Past Medical/Family/Surgical/Social History: Medications & Allergies reviewed per EMR, new medications updated. Patient Active Problem List   Diagnosis Date Noted  . 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 Scoliosis of thoracolumbar spine 07/11/2019  . Laceration of left hand without foreign body 03/24/2019  . Choking 03/24/2019  . Hypokalemia 03/24/2019  . Fatigue 12/31/2018  . Leg swelling 09/14/2018  . Degenerative arthritis of knee, bilateral 07/05/2018  . Protrusion of lumbar intervertebral disc 04/20/2018  . Difficulty urinating 11/24/2017  . Dizziness 08/11/2017  . Sacroiliac pain 06/17/2017  . Greater trochanteric bursitis of right hip 05/27/2017  . Vertigo 04/29/2017  . Dysphagia 10/28/2016  . Lump of skin 05/11/2016  . 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 Chronic meniscal tear of knee 03/05/2016  . Nausea 03/04/2016  . Osteopenia 12/03/2015  . Thyroid nodule 11/16/2015  . Bilateral leg edema 11/05/2015  . Anterior neck pain 11/05/2015  . Hormone replacement therapy (HRT) 11/05/2015  . Subacromial bursitis 09/16/2015  . 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 Trochanteric bursitis of both hips 03/12/2015  . Subacromial impingement of left shoulder 03/12/2015  . Hyperlipidemia 05/01/2014  . Chronic pain syndrome 05/01/2014  . Lumbar radiculopathy 03/27/2014  . Left shoulder pain 11/07/2013  . Degeneration of intervertebral disc of cervical region 10/10/2013  . Fibromyalgia 09/06/2013  . Bilateral shoulder pain 09/06/2013  . Low back pain 08/12/2013  . Chronic cough 04/12/2013  . Sinusitis, chronic 04/12/2013  . Hypothyroidism 04/07/2012  . Chronic venous insufficiency 04/02/2010  . Enthesopathy of hip region 07/19/2007  . Osteoporosis 07/19/2007  . Anxiety 01/23/2007  . Depression 01/23/2007  . Migraine 01/23/2007  . NINAR (noninfectious nonallergic rhinitis) 01/23/2007  .  Gastroesophageal reflux disease 01/23/2007  . IBS 01/23/2007   Past Medical History:  Diagnosis Date  . ALLERGIC RHINITIS   . Allergy   . Anemia   . ANXIETY   . Arthritis    hands  . Bronchitis   . Chronic fatigue fibromyalgia syndrome   . Complication of anesthesia    says one time waking up she couldn't breathe, felt like her throat closing up  . DEPRESSION   . Enthesopathy of hip region   . Family history of anesthesia complication    sister with n/v  . FOOT PAIN, BILATERAL   . GERD   . Hiatal hernia   . HYPERLIPIDEMIA   . Hypothyroidism   . IBS   . MIGRAINE HEADACHE   . MIGRAINE, COMMON   . NECK MASS   . OSTEOPENIA   . OSTEOPOROSIS   . PLANTAR FASCIITIS   . RASH-NONVESICULAR   . SINUSITIS- ACUTE-NOS   . SKIN LESION   . VAGINITIS   . VENOUS INSUFFICIENCY, CHRONIC    Family History  Problem Relation Age of Onset  . Diabetes Mother   . Heart disease Father   . Diabetes Father   . Diabetes Sister   . Breast cancer Maternal Grandmother   . Heart disease Maternal Uncle  x 2  . Heart disease Maternal Aunt   . Kidney disease Paternal Uncle        questionable  . Heart disease Son   . Healthy Son   . Irritable bowel syndrome Son   . Colon cancer Neg Hx   . Colon polyps Neg Hx   . Rectal cancer Neg Hx   . Stomach cancer Neg Hx    Past Surgical History:  Procedure Laterality Date  . APPENDECTOMY    . BREAST ENHANCEMENT SURGERY    . BREAST IMPLANT REMOVAL    . CHOLECYSTECTOMY    . COLONOSCOPY    . FOOT SURGERY  11/06/2016  . MASTECTOMY     bilateral for severe bilateral fibrocystic disease  . OOPHORECTOMY    . s/p neck lump removal    . SHOULDER SURGERY    . SINUS ENDO W/FUSION Right 06/30/2013   Procedure: RIGHT ENDOSCOPIC SPHENOIDECTOMY WITH FUSION SCAN;  Surgeon: [name], MD;  Location: [facility];  Service: ENT;  Laterality: Right;  . TONSILLECTOMY    . VAGINAL HYSTERECTOMY     Social History   Occupational History  . Occupation:  retired Press photographer  Tobacco Use  . Smoking status: Never Smoker  . Smokeless tobacco: Never Used  Substance and Sexual Activity  . Alcohol use: No  . Drug use: No  . Sexual activity: Not Currently

## 2019-07-14 ENCOUNTER — Other Ambulatory Visit: Payer: Self-pay | Admitting: Internal Medicine

## 2019-07-14 DIAGNOSIS — F419 Anxiety disorder, unspecified: Secondary | ICD-10-CM

## 2019-07-14 NOTE — Telephone Encounter (Signed)
Last OV 03/24/19 Next OV 09/27/19 Last RF 06/06/19

## 2019-08-06 ENCOUNTER — Other Ambulatory Visit: Payer: Self-pay | Admitting: Cardiology

## 2019-08-08 ENCOUNTER — Encounter: Payer: Self-pay | Admitting: Internal Medicine

## 2019-08-08 MED ORDER — BENZONATATE 200 MG PO CAPS: 200.0000 mg | ORAL_CAPSULE | Freq: Three times a day (TID) | ORAL | 0 refills | Status: DC | PRN

## 2019-08-08 NOTE — Telephone Encounter (Signed)
Refill was sent to pt cardiologist Dr. Meda Coffee. Pls advise if ok to refill.Marland KitchenJohny Barnes

## 2019-08-16 ENCOUNTER — Encounter: Payer: Self-pay | Admitting: Physical Medicine and Rehabilitation

## 2019-08-17 ENCOUNTER — Telehealth: Payer: Self-pay | Admitting: Physical Medicine and Rehabilitation

## 2019-08-17 NOTE — Telephone Encounter (Signed)
Called patient to schedule ESI (see MyChart message). Patient advised per Dr. Ernestina Patches that we could try a different type of ESI and that if that was not helpful we would need to get a new MRI. Patient ended call.

## 2019-08-21 ENCOUNTER — Encounter: Payer: Self-pay | Admitting: Internal Medicine

## 2019-08-22 MED ORDER — BENZONATATE 200 MG PO CAPS: 200.0000 mg | ORAL_CAPSULE | Freq: Three times a day (TID) | ORAL | 5 refills | Status: DC | PRN

## 2019-08-23 DIAGNOSIS — H43391 Other vitreous opacities, right eye: Secondary | ICD-10-CM | POA: Diagnosis not present

## 2019-08-23 DIAGNOSIS — H353111 Nonexudative age-related macular degeneration, right eye, early dry stage: Secondary | ICD-10-CM | POA: Diagnosis not present

## 2019-08-23 DIAGNOSIS — H31092 Other chorioretinal scars, left eye: Secondary | ICD-10-CM | POA: Diagnosis not present

## 2019-08-23 DIAGNOSIS — H353122 Nonexudative age-related macular degeneration, left eye, intermediate dry stage: Secondary | ICD-10-CM | POA: Diagnosis not present

## 2019-08-23 DIAGNOSIS — H35363 Drusen (degenerative) of macula, bilateral: Secondary | ICD-10-CM | POA: Diagnosis not present

## 2019-08-28 ENCOUNTER — Encounter: Payer: Self-pay | Admitting: Internal Medicine

## 2019-08-28 DIAGNOSIS — F419 Anxiety disorder, unspecified: Secondary | ICD-10-CM

## 2019-08-29 MED ORDER — ALPRAZOLAM 0.5 MG PO TABS: ORAL_TABLET | ORAL | 0 refills | Status: DC

## 2019-09-06 ENCOUNTER — Ambulatory Visit: Payer: Self-pay

## 2019-09-06 ENCOUNTER — Encounter: Payer: Self-pay | Admitting: Physical Medicine and Rehabilitation

## 2019-09-06 ENCOUNTER — Other Ambulatory Visit: Payer: Self-pay

## 2019-09-06 ENCOUNTER — Ambulatory Visit: Payer: Medicare Other | Admitting: Physical Medicine and Rehabilitation

## 2019-09-06 VITALS — BP 137/82 | HR 104

## 2019-09-06 DIAGNOSIS — M5416 Radiculopathy, lumbar region: Secondary | ICD-10-CM | POA: Diagnosis not present

## 2019-09-06 DIAGNOSIS — M419 Scoliosis, unspecified: Secondary | ICD-10-CM

## 2019-09-06 DIAGNOSIS — M25552 Pain in left hip: Secondary | ICD-10-CM

## 2019-09-06 DIAGNOSIS — M5116 Intervertebral disc disorders with radiculopathy, lumbar region: Secondary | ICD-10-CM | POA: Diagnosis not present

## 2019-09-06 MED ORDER — HYDROCODONE-ACETAMINOPHEN 5-325 MG PO TABS: 1.0000 | ORAL_TABLET | Freq: Three times a day (TID) | ORAL | 0 refills | Status: AC | PRN

## 2019-09-06 MED ORDER — METHYLPREDNISOLONE ACETATE 80 MG/ML IJ SUSP
40.0000 mg | Freq: Once | INTRAMUSCULAR | Status: AC
  Administered 2019-09-06: 15:00:00 40 mg

## 2019-09-06 NOTE — Progress Notes (Signed)
Numeric Pain Rating Scale and Functional Assessment Average Pain 10   In the last MONTH (on 0-10 scale) has pain interfered with the following?  1. General activity like being  able to carry out your everyday physical activities such as walking, climbing stairs, carrying groceries, or moving a chair?  Rating(10)   +Driver, -BT, -Dye Allergies. 

## 2019-09-06 NOTE — Progress Notes (Unsigned)
Laura Barnes

## 2019-09-11 ENCOUNTER — Encounter: Payer: Self-pay | Admitting: Physical Medicine and Rehabilitation

## 2019-09-12 ENCOUNTER — Ambulatory Visit: Payer: Self-pay | Admitting: Physician Assistant

## 2019-09-12 ENCOUNTER — Telehealth: Payer: Self-pay | Admitting: Rheumatology

## 2019-09-12 NOTE — Progress Notes (Signed)
Office Visit Note  Patient: Laura Barnes             Date of Birth: 13-Feb-1941           MRN: [id number]             PCP: Binnie Rail, MD Referring: Binnie Rail, MD Visit Date: 09/13/2019 Occupation: @GUAROCC @  Subjective:  Severe lower back pain   History of Present Illness: Laura Barnes is a 79 y.o. female with history of osteoarthritis, fibromyalgia, and DDD.  She presents today with severe lower back pain and left sided radiculopathy.  She states she had an epidural injection 1 week ago performed by Dr. Ernestina Patches.  She states the epidural injection resolved her right sided radiculopathy but now this is experiencing a new pain on the left side.  She states the pain is constant and severe.  She states if she could cut off her left leg she would.  She has tried taking hydrocodone for pain relief but states it has been ineffective..  She has tried arnica gel topically.  She states she cried all night due to the discomfort.    Activities of Daily Living:  Patient reports morning stiffness for 0 none.   Patient Denies nocturnal pain.  Difficulty dressing/grooming: Reports Difficulty climbing stairs: Reports Difficulty getting out of chair: Reports Difficulty using hands for taps, buttons, cutlery, and/or writing: Reports  Review of Systems  Constitutional: Positive for fatigue.  HENT: Negative for mouth sores, mouth dryness and nose dryness.   Eyes: Negative for pain, visual disturbance and dryness.  Respiratory: Positive for shortness of breath. Negative for cough, hemoptysis and difficulty breathing.   Cardiovascular: Positive for swelling in legs/feet. Negative for chest pain, palpitations and hypertension.  Gastrointestinal: Positive for constipation, diarrhea and nausea. Negative for blood in stool.  Endocrine: Negative for increased urination.  Genitourinary: Negative for difficulty urinating and painful urination.  Musculoskeletal: Positive for arthralgias and  joint pain. Negative for gait problem, joint swelling, myalgias, muscle weakness, morning stiffness, muscle tenderness and myalgias.  Skin: Negative for color change, pallor, rash, hair loss, nodules/bumps, skin tightness, ulcers and sensitivity to sunlight.  Allergic/Immunologic: Negative for susceptible to infections.  Neurological: Negative for dizziness and headaches.  Hematological: Negative for swollen glands.  Psychiatric/Behavioral: Positive for sleep disturbance. Negative for depressed mood. The patient is not nervous/anxious.     PMFS History:  Patient Active Problem List   Diagnosis Date Noted  . 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 Scoliosis of thoracolumbar spine 07/11/2019  . Laceration of left hand without foreign body 03/24/2019  . Choking 03/24/2019  . Hypokalemia 03/24/2019  . Fatigue 12/31/2018  . Leg swelling 09/14/2018  . Degenerative arthritis of knee, bilateral 07/05/2018  . Protrusion of lumbar intervertebral disc 04/20/2018  . Difficulty urinating 11/24/2017  . Dizziness 08/11/2017  . Sacroiliac pain 06/17/2017  . Greater trochanteric bursitis of right hip 05/27/2017  . Vertigo 04/29/2017  . Dysphagia 10/28/2016  . Lump of skin 05/11/2016  . 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 Chronic meniscal tear of knee 03/05/2016  . Nausea 03/04/2016  . Osteopenia 12/03/2015  . Thyroid nodule 11/16/2015  . Bilateral leg edema 11/05/2015  . Anterior neck pain 11/05/2015  . Hormone replacement therapy (HRT) 11/05/2015  . Subacromial bursitis 09/16/2015  . 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 Trochanteric bursitis of both hips 03/12/2015  . Subacromial impingement of left shoulder 03/12/2015  .  Hyperlipidemia 05/01/2014  . Chronic pain syndrome 05/01/2014  . Lumbar radiculopathy 03/27/2014  . Left shoulder pain 11/07/2013  . Degeneration of intervertebral disc of cervical region 10/10/2013  .  Fibromyalgia 09/06/2013  . Bilateral shoulder pain 09/06/2013  . Low back pain 08/12/2013  . Chronic cough 04/12/2013  . Sinusitis, chronic 04/12/2013  . Hypothyroidism 04/07/2012  . Chronic venous insufficiency 04/02/2010  . Enthesopathy of hip region 07/19/2007  . Osteoporosis 07/19/2007  . Anxiety 01/23/2007  . Depression 01/23/2007  . Migraine 01/23/2007  . NINAR (noninfectious nonallergic rhinitis) 01/23/2007  . Gastroesophageal reflux disease 01/23/2007  . IBS 01/23/2007    Past Medical History:  Diagnosis Date  . ALLERGIC RHINITIS   . Allergy   . Anemia   . ANXIETY   . Arthritis    hands  . Bronchitis   . Chronic fatigue fibromyalgia syndrome   . Complication of anesthesia    says one time waking up she couldn't breathe, felt like her throat closing up  . DEPRESSION   . Enthesopathy of hip region   . Family history of anesthesia complication    sister with n/v  . FOOT PAIN, BILATERAL   . GERD   . Hiatal hernia   . HYPERLIPIDEMIA   . Hypothyroidism   . IBS   . MIGRAINE HEADACHE   . MIGRAINE, COMMON   . NECK MASS   . OSTEOPENIA   . OSTEOPOROSIS   . PLANTAR FASCIITIS   . RASH-NONVESICULAR   . SINUSITIS- ACUTE-NOS   . SKIN LESION   . VAGINITIS   . VENOUS INSUFFICIENCY, CHRONIC     Family History  Problem Relation Age of Onset  . Diabetes Mother   . Heart disease Father   . Diabetes Father   . Diabetes Sister   . Breast cancer Maternal Grandmother   . Heart disease Maternal Uncle        x 2  . Heart disease Maternal Aunt   . Kidney disease Paternal Uncle        questionable  . Heart disease Son   . Healthy Son   . Irritable bowel syndrome Son   . Colon cancer Neg Hx   . Colon polyps Neg Hx   . Rectal cancer Neg Hx   . Stomach cancer Neg Hx    Past Surgical History:  Procedure Laterality Date  . APPENDECTOMY    . BREAST ENHANCEMENT SURGERY    . BREAST IMPLANT REMOVAL    . CHOLECYSTECTOMY    . COLONOSCOPY    . FOOT SURGERY  11/06/2016  .  MASTECTOMY     bilateral for severe bilateral fibrocystic disease  . OOPHORECTOMY    . s/p neck lump removal    . SHOULDER SURGERY    . SINUS ENDO W/FUSION Right 06/30/2013   Procedure: RIGHT ENDOSCOPIC SPHENOIDECTOMY WITH FUSION SCAN;  Surgeon: [name], MD;  Location: [facility];  Service: ENT;  Laterality: Right;  . TONSILLECTOMY    . VAGINAL HYSTERECTOMY     Social History   Social History Narrative   Has 2 biological children and 1 step child   Immunization History  Administered Date(s) Administered  . Fluad Quad(high Dose 65+) 01/18/2019  . Influenza Split 04/07/2011, 03/08/2012  . Influenza Whole 03/20/2008, 04/01/2009, 04/02/2010  . Influenza, High Dose Seasonal PF 04/12/2013, 03/26/2015, 03/04/2016, 04/01/2017  . Influenza,inj,Quad PF,6+ Mos 05/01/2014  . Influenza-Unspecified 04/01/2018  . Pneumococcal Conjugate-13 05/02/2013  . Pneumococcal Polysaccharide-23 01/25/2006  . Td 09/17/2008  . Tdap  03/24/2019  . Zoster 01/25/2006  . Zoster Recombinat (Shingrix) 08/18/2017, 12/06/2017     Objective: Vital Signs: BP 137/75 (BP Location: Left Arm, Patient Position: Sitting, Cuff Size: Normal)   Pulse (!) 108   Resp 18   Ht 4' 11.75" (1.518 m)   Wt 129 lb 12.8 oz (58.9 kg)   BMI 25.56 kg/m    Physical Exam Vitals and nursing note reviewed.  Constitutional:      Appearance: She is well-developed.  HENT:     Head: Normocephalic and atraumatic.  Eyes:     Conjunctiva/sclera: Conjunctivae normal.  Pulmonary:     Effort: Pulmonary effort is normal.  Abdominal:     General: Bowel sounds are normal.     Palpations: Abdomen is soft.  Musculoskeletal:     Cervical back: Normal range of motion.  Lymphadenopathy:     Cervical: No cervical adenopathy.  Skin:    General: Skin is warm and dry.     Capillary Refill: Capillary refill takes less than 2 seconds.  Neurological:     Mental Status: She is alert and oriented to person, place, and time.  Psychiatric:         Behavior: Behavior normal.      Musculoskeletal Exam: C-spine good ROM.  Painful and limited ROM of lumbar spine.  Shoulder joints, elbow joints, wrist joints, MCPs, PIPs, and DIPs good ROM with no synovitis.  PIP and DIP thickening consistent with osteoarthritis.  Left piriformis muscle tenderness.  Knee joints good ROM with no discomfort.   CDAI Exam: CDAI Score: -- Patient Global: --; Provider Global: -- Swollen: --; Tender: -- Joint Exam 09/13/2019   No joint exam has been documented for this visit   There is currently no information documented on the homunculus. Go to the Rheumatology activity and complete the homunculus joint exam.  Investigation: No additional findings.  Imaging: XR C-ARM NO REPORT  Result Date: 09/06/2019 Please see Notes tab for imaging impression.   Recent Labs: Lab Results  Component Value Date   WBC 8.1 03/24/2019   HGB 13.2 03/24/2019   PLT 225.0 03/24/2019   NA 137 03/24/2019   K 3.5 03/24/2019   CL 99 03/24/2019   CO2 28 03/24/2019   GLUCOSE 90 03/24/2019   BUN 12 03/24/2019   CREATININE 0.90 03/24/2019   BILITOT 0.4 12/30/2018   ALKPHOS 44 12/30/2018   AST 23 12/30/2018   ALT 18 12/30/2018   PROT 7.7 12/30/2018   ALBUMIN 4.4 12/30/2018   CALCIUM 10.4 03/24/2019   GFRAA >60 04/23/2017    Speciality Comments: No specialty comments available.  Procedures:  Trigger Point Inj  Date/Time: 09/13/2019 1:20 PM Performed by: Bo Merino, MD Authorized by: Bo Merino, MD   Consent Given by:  Patient Site marked: the procedure site was marked   Timeout: prior to procedure the correct patient, procedure, and site was verified   Indications:  Pain Total # of Trigger Points:  1 Location: hip   Location comment:  Left piriformis muscle  Needle Size:  27 G Approach:  Dorsal Medications #1:  1 mL lidocaine 1 %; 40 mg triamcinolone acetonide 40 MG/ML Patient tolerance:  Patient tolerated the procedure well with no immediate  complications   Allergies: Prednisone, Amoxicillin-pot clavulanate, Aspirin, Erythromycin, Levofloxacin, Nsaids, Statins, Sumatriptan, Adhesive [tape], Ivp dye [iodinated diagnostic agents], Methylphenidate hcl, Mirtazapine, Red yeast rice [cholestin], Shellfish allergy, Soy allergy, Doxycycline, Hydrocodone, Hydrocodone-acetaminophen, Latex, Rofecoxib, Sertraline hcl, and Sulfonamide derivatives   Assessment / Plan:  Visit Diagnoses: Primary osteoarthritis of both hands: She has PIP and DIP thickening consistent with osteoarthritis of both hands. No synovitis noted.  Joint protection and muscle strengthening were discussed.   Trochanteric bursitis of both hips: She has tenderness over bilateral trochanteric bursa.    DDD (degenerative disc disease), cervical: She has good ROM with no discomfort.   DDD (degenerative disc disease), lumbar: She has chronic and severe lower back pain.  She has been experiencing right sided radiculopathy.  She had an epidural injection on 09/06/19 performed by Dr. Ernestina Patches, which resolved the right sided radiculopathy she was experiencing.  She presents today with severe lower back pain and left sided sciatica.  She has tenderness to palpation over the left piriformis muscle.  A cortisone injection was performed today. She will be starting physical therapy at [facility] as recommended by Dr. Ernestina Patches. She would like to proceed with scheduling a MRI of the lumbar spine.    Subacromial impingement of left shoulder: She is not having any increased discomfort at this time.   Fibromyalgia: She has generalized hyperalgesia and positive tender points on exam. She has been having frequent and severe fibromyalgia flares.  She presents today with left sided piriformis syndrome. A cortisone injection was performed today.  We discussed the importance of regular exercise and good sleep hygiene.    Chronic pain syndrome: She has tried taking hydrocodone as needed for pain relief.   She plans on following up with Dr. Quay Burow to discuss pain management.   Piriformis syndrome of left side: She has tenderness to palpation over the left piriformis muscle. She is experiencing left sided sciatica. She presents today in severe pain.   She is having difficulty walking due to the discomfort.  She has been having severe nocturnal pain and states she cried throughout the entire night last night. Different treatment options were discussed. She requested a cortisone injection today.  She tolerated the procedure well. Procedure note completed above. She has tried taking hydrocodone as needed for pain relief, which has not been effective at managing her pain.  She will be following up with Dr. Quay Burow to discuss pain management.   Osteopenia of multiple sites: DEXA on 03/15/18: RFN -1.2, LFN -1.2, and lumbar spine -1.2.  She is taking a vitamin D supplement.   Other medical conditions are listed as follows:   Anxiety and depression  Chronic venous insufficiency  Thyroid nodule  Hormone replacement therapy (HRT)  History of gastroesophageal reflux (GERD)  History of hyperlipidemia  History of IBS  Hypothyroidism, unspecified type  Orders: Orders Placed This Encounter  Procedures  . Trigger Point Inj   No orders of the defined types were placed in this encounter.   Face-to-face time spent with patient was 30 minutes. Greater than 50% of time was spent in counseling and coordination of care.  Follow-Up Instructions: Return in about 6 months (around 03/14/2020) for Fibromyalgia, Osteoarthritis.   Ofilia Neas, PA-C   I examined and evaluated the patient with Hazel Sams PA.  Patient had tenderness on palpation of her left piriformis area.  She had good response to the injection given by Dr. Ernestina Patches.  Per her request left piriformis area was injected with cortisone.  She is concerned that her back is getting worse with left-sided radiculopathy and she wants to get MRI of her  lumbar spine.  The plan of care was discussed as noted above.  Bo Merino, MD  Note - This record has been created using Dragon  software.  Chart creation errors have been sought, but may not always  have been located. Such creation errors do not reflect on  the standard of medical care.

## 2019-09-12 NOTE — Telephone Encounter (Signed)
Patient called stating she is experiencing extreme body ache and muscle pain affecting her entire body.  Patient states she can barely walk.  Patient is requesting a return call to let her know if she needs to come to the office or if something could be called in for the pain.  Patient states she has been taking Ibuprofen, but it isn't helping to relieve the pain.

## 2019-09-12 NOTE — Telephone Encounter (Signed)
Patient states she had an epidural steroid injection last Wednesday and prior to the injection, she was "shifting her weight due to the pain". Patient states the top of her left leg is numb and she is experiencing muscle aches/pains all over. Patient is now scheduled for an appointment in the office tomorrow at 1pm.

## 2019-09-13 ENCOUNTER — Other Ambulatory Visit: Payer: Self-pay

## 2019-09-13 ENCOUNTER — Encounter: Payer: Self-pay | Admitting: Physician Assistant

## 2019-09-13 ENCOUNTER — Ambulatory Visit: Payer: Medicare Other | Admitting: Physician Assistant

## 2019-09-13 VITALS — BP 137/75 | HR 108 | Resp 18 | Ht 59.75 in | Wt 129.8 lb

## 2019-09-13 DIAGNOSIS — G5702 Lesion of sciatic nerve, left lower limb: Secondary | ICD-10-CM | POA: Diagnosis not present

## 2019-09-13 DIAGNOSIS — M797 Fibromyalgia: Secondary | ICD-10-CM

## 2019-09-13 DIAGNOSIS — M19041 Primary osteoarthritis, right hand: Secondary | ICD-10-CM

## 2019-09-13 DIAGNOSIS — M5030 Other cervical disc degeneration, unspecified cervical region: Secondary | ICD-10-CM | POA: Diagnosis not present

## 2019-09-13 DIAGNOSIS — G894 Chronic pain syndrome: Secondary | ICD-10-CM

## 2019-09-13 DIAGNOSIS — F329 Major depressive disorder, single episode, unspecified: Secondary | ICD-10-CM

## 2019-09-13 DIAGNOSIS — Z8719 Personal history of other diseases of the digestive system: Secondary | ICD-10-CM

## 2019-09-13 DIAGNOSIS — E039 Hypothyroidism, unspecified: Secondary | ICD-10-CM

## 2019-09-13 DIAGNOSIS — M7061 Trochanteric bursitis, right hip: Secondary | ICD-10-CM | POA: Diagnosis not present

## 2019-09-13 DIAGNOSIS — M7062 Trochanteric bursitis, left hip: Secondary | ICD-10-CM

## 2019-09-13 DIAGNOSIS — M19042 Primary osteoarthritis, left hand: Secondary | ICD-10-CM

## 2019-09-13 DIAGNOSIS — Z79890 Hormone replacement therapy (postmenopausal): Secondary | ICD-10-CM

## 2019-09-13 DIAGNOSIS — M7542 Impingement syndrome of left shoulder: Secondary | ICD-10-CM

## 2019-09-13 DIAGNOSIS — M5136 Other intervertebral disc degeneration, lumbar region: Secondary | ICD-10-CM | POA: Diagnosis not present

## 2019-09-13 DIAGNOSIS — F419 Anxiety disorder, unspecified: Secondary | ICD-10-CM

## 2019-09-13 DIAGNOSIS — Z8639 Personal history of other endocrine, nutritional and metabolic disease: Secondary | ICD-10-CM

## 2019-09-13 DIAGNOSIS — M8589 Other specified disorders of bone density and structure, multiple sites: Secondary | ICD-10-CM

## 2019-09-13 DIAGNOSIS — F32A Depression, unspecified: Secondary | ICD-10-CM

## 2019-09-13 DIAGNOSIS — M51369 Other intervertebral disc degeneration, lumbar region without mention of lumbar back pain or lower extremity pain: Secondary | ICD-10-CM

## 2019-09-13 DIAGNOSIS — I872 Venous insufficiency (chronic) (peripheral): Secondary | ICD-10-CM

## 2019-09-13 DIAGNOSIS — E041 Nontoxic single thyroid nodule: Secondary | ICD-10-CM

## 2019-09-13 MED ORDER — LIDOCAINE HCL 1 % IJ SOLN
1.0000 mL | INTRAMUSCULAR | Status: AC | PRN
  Administered 2019-09-13: 1 mL

## 2019-09-13 MED ORDER — TRIAMCINOLONE ACETONIDE 40 MG/ML IJ SUSP
40.0000 mg | INTRAMUSCULAR | Status: AC | PRN
  Administered 2019-09-13: 40 mg via INTRAMUSCULAR

## 2019-09-13 NOTE — Patient Instructions (Signed)
Piriformis Syndrome Rehab Ask your health care provider which exercises are safe for you. Do exercises exactly as told by your health care provider and adjust them as directed. It is normal to feel mild stretching, pulling, tightness, or discomfort as you do these exercises. Stop right away if you feel sudden pain or your pain gets worse. Do not begin these exercises until told by your health care provider. Stretching and range-of-motion exercises These exercises warm up your muscles and joints and improve the movement and flexibility of your hip and pelvis. The exercises also help to relieve pain, numbness, and tingling. Hip rotation This is an exercise in which you lie on your back and stretch the muscles that rotate your hip (hip rotators) to stretch your buttocks. 1. Lie on your back on a firm surface. 2. Pull your left / right knee toward your same shoulder with your left / right hand until your knee is pointing toward the ceiling. Hold your left / right ankle with your other hand. 3. Keeping your knee steady, gently pull your left / right ankle toward your other shoulder until you feel a stretch in your buttocks. 4. Hold this position for __________ seconds. Repeat __________ times. Complete this exercise __________ times a day. Hip extensor This is an exercise in which you lie on your back and pull your knee to your chest. 1. Lie on your back on a firm surface. Both of your legs should be straight. 2. Pull your left / right knee to your chest. Hold your leg in this position by holding onto the back of your thigh or the front of your knee. 3. Hold this position for __________ seconds. 4. Slowly return to the starting position. Repeat __________ times. Complete this exercise __________ times a day. Strengthening exercises These exercises build strength and endurance in your hip and thigh muscles. Endurance is the ability to use your muscles for a long time, even after they get  tired. Straight leg raises, side-lying This exercise strengthens the muscles that rotate the leg at the hip and move it away from your body (hip abductors). 1. Lie on your side with your left / right leg in the top position. Lie so your head, shoulder, knee, and hip line up. Bend your bottom knee to help you balance. 2. Lift your top leg 4-6 inches (10-15 cm) while keeping your toes pointed straight ahead. 3. Hold this position for __________ seconds. 4. Slowly lower your leg to the starting position. 5. Let your muscles relax completely after each repetition. Repeat __________ times. Complete this exercise __________ times a day. Hip abduction and rotation This is sometimes called quadruped (on hands and knees) exercises. 1. Get on your hands and knees on a firm, lightly padded surface. Your hands should be directly below your shoulders, and your knees should be directly below your hips. 2. Lift your left / right knee out to the side. Keep your knee bent. Do not twist your body. 3. Hold this position for __________ seconds. 4. Slowly lower your leg. Repeat __________ times. Complete this exercise __________ times a day. Straight leg raises, face-down This exercise stretches the muscles that move your hips away from the front of the pelvis (hip extensors). 1. Lie on your abdomen on a bed or a firm surface with a pillow under your hips. 2. Squeeze your buttocks muscles and lift your left / right leg about 4-6 inches (10-15 cm) off the bed. Do not let your back arch. 3. Hold  this position for __________ seconds. 4. Slowly lower your leg to the starting position. 5. Let your muscles relax completely after each repetition. Repeat __________ times. Complete this exercise __________ times a day. This information is not intended to replace advice given to you by your health care provider. Make sure you discuss any questions you have with your health care provider. Document Revised: 09/08/2018  Document Reviewed: 03/10/2018 [employer] Patient Education  2020 [employer]  

## 2019-09-14 ENCOUNTER — Encounter: Payer: Self-pay | Admitting: Internal Medicine

## 2019-09-15 ENCOUNTER — Emergency Department (HOSPITAL_COMMUNITY)
Admission: EM | Admit: 2019-09-15 | Discharge: 2019-09-15 | Disposition: A | Discharge status: Home / Self Care | Payer: Medicare Other | Attending: Emergency Medicine | Admitting: Emergency Medicine

## 2019-09-15 ENCOUNTER — Encounter: Payer: Self-pay | Admitting: Internal Medicine

## 2019-09-15 ENCOUNTER — Other Ambulatory Visit: Payer: Self-pay

## 2019-09-15 DIAGNOSIS — M5442 Lumbago with sciatica, left side: Secondary | ICD-10-CM | POA: Diagnosis not present

## 2019-09-15 DIAGNOSIS — R0902 Hypoxemia: Secondary | ICD-10-CM | POA: Diagnosis not present

## 2019-09-15 DIAGNOSIS — Z91040 Latex allergy status: Secondary | ICD-10-CM | POA: Diagnosis not present

## 2019-09-15 DIAGNOSIS — M5432 Sciatica, left side: Secondary | ICD-10-CM

## 2019-09-15 DIAGNOSIS — Z9049 Acquired absence of other specified parts of digestive tract: Secondary | ICD-10-CM | POA: Insufficient documentation

## 2019-09-15 DIAGNOSIS — Z743 Need for continuous supervision: Secondary | ICD-10-CM | POA: Diagnosis not present

## 2019-09-15 DIAGNOSIS — Z79899 Other long term (current) drug therapy: Secondary | ICD-10-CM | POA: Insufficient documentation

## 2019-09-15 DIAGNOSIS — E039 Hypothyroidism, unspecified: Secondary | ICD-10-CM | POA: Diagnosis not present

## 2019-09-15 DIAGNOSIS — I10 Essential (primary) hypertension: Secondary | ICD-10-CM | POA: Diagnosis not present

## 2019-09-15 DIAGNOSIS — M79605 Pain in left leg: Secondary | ICD-10-CM | POA: Diagnosis not present

## 2019-09-15 MED ORDER — LIDOCAINE 5 % EX PTCH
1.0000 | MEDICATED_PATCH | CUTANEOUS | Status: DC
  Administered 2019-09-15: 1 via TRANSDERMAL
  Filled 2019-09-15: qty 1

## 2019-09-15 MED ORDER — DEXAMETHASONE SODIUM PHOSPHATE 10 MG/ML IJ SOLN
10.0000 mg | Freq: Once | INTRAMUSCULAR | Status: AC
  Administered 2019-09-15: 10 mg via INTRAMUSCULAR
  Filled 2019-09-15: qty 1

## 2019-09-15 MED ORDER — GABAPENTIN 300 MG PO CAPS: 300.0000 mg | ORAL_CAPSULE | Freq: Three times a day (TID) | ORAL | 0 refills | Status: DC

## 2019-09-15 MED ORDER — HYDROMORPHONE HCL 1 MG/ML IJ SOLN
1.0000 mg | Freq: Once | INTRAMUSCULAR | Status: AC
  Administered 2019-09-15: 1 mg via INTRAMUSCULAR
  Filled 2019-09-15: qty 1

## 2019-09-15 NOTE — ED Provider Notes (Signed)
[facility] DEPT Provider Note   CSN: [id number] Arrival date & time: 09/15/19  1236     History Chief Complaint  Patient presents with  . Leg Pain    left    Laura Barnes is a 79 y.o. female with history of osteoarthritis, fibromyalgia, and DDD who presents with left leg pain. Symptoms have been going on for three weeks. She has severe, constant, 10/10 pain that goes from the left lower back/left hip to the foot. The pain is circumferential and she states it feels like a severe cramping of all her muscles, ligaments, bones. She had an epidural steroid injection on 4/7 by Dr. Ernestina Patches without relief. She saw Dr. Estanislado Pandy 2 days ago and had an injection for suspected piriformis syndrome without relief. She was told to f/u with Dr. Quay Burow for pain control but she is out of town. Since pain has been excrutiating she came to the ED. She has an MRI of the lumbar spine ordered but not scheduled. No fever, syncope, trauma, unexplained weight loss, hx of cancer, loss of bowel/bladder function, saddle anesthesia, urinary retention, IVDU.  HPI     Past Medical History:  Diagnosis Date  . ALLERGIC RHINITIS   . Allergy   . Anemia   . ANXIETY   . Arthritis    hands  . Bronchitis   . Chronic fatigue fibromyalgia syndrome   . Complication of anesthesia    says one time waking up she couldn't breathe, felt like her throat closing up  . DEPRESSION   . Enthesopathy of hip region   . Family history of anesthesia complication    sister with n/v  . FOOT PAIN, BILATERAL   . GERD   . Hiatal hernia   . HYPERLIPIDEMIA   . Hypothyroidism   . IBS   . MIGRAINE HEADACHE   . MIGRAINE, COMMON   . NECK MASS   . OSTEOPENIA   . OSTEOPOROSIS   . PLANTAR FASCIITIS   . RASH-NONVESICULAR   . SINUSITIS- ACUTE-NOS   . SKIN LESION   . VAGINITIS   . VENOUS INSUFFICIENCY, CHRONIC     Patient Active Problem List   Diagnosis Date Noted  . Bilateral stenosis of lateral  recess of lumbar spine 07/11/2019  . Scoliosis of thoracolumbar spine 07/11/2019  . Laceration of left hand without foreign body 03/24/2019  . Choking 03/24/2019  . Hypokalemia 03/24/2019  . Fatigue 12/31/2018  . Leg swelling 09/14/2018  . Degenerative arthritis of knee, bilateral 07/05/2018  . Protrusion of lumbar intervertebral disc 04/20/2018  . Difficulty urinating 11/24/2017  . Dizziness 08/11/2017  . Sacroiliac pain 06/17/2017  . Greater trochanteric bursitis of right hip 05/27/2017  . Vertigo 04/29/2017  . Dysphagia 10/28/2016  . Lump of skin 05/11/2016  . 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 Chronic meniscal tear of knee 03/05/2016  . Nausea 03/04/2016  . Osteopenia 12/03/2015  . Thyroid nodule 11/16/2015  . Bilateral leg edema 11/05/2015  . Anterior neck pain 11/05/2015  . Hormone replacement therapy (HRT) 11/05/2015  . Subacromial bursitis 09/16/2015  . 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 Trochanteric bursitis of both hips 03/12/2015  . Subacromial impingement of left shoulder 03/12/2015  . Hyperlipidemia 05/01/2014  . Chronic pain syndrome 05/01/2014  . Lumbar radiculopathy 03/27/2014  . Left shoulder pain 11/07/2013  . Degeneration of intervertebral disc of cervical region 10/10/2013  . Fibromyalgia 09/06/2013  . Bilateral shoulder pain 09/06/2013  . Low back pain 08/12/2013  .  Chronic cough 04/12/2013  . Sinusitis, chronic 04/12/2013  . Hypothyroidism 04/07/2012  . Chronic venous insufficiency 04/02/2010  . Enthesopathy of hip region 07/19/2007  . Osteoporosis 07/19/2007  . Anxiety 01/23/2007  . Depression 01/23/2007  . Migraine 01/23/2007  . NINAR (noninfectious nonallergic rhinitis) 01/23/2007  . Gastroesophageal reflux disease 01/23/2007  . IBS 01/23/2007    Past Surgical History:  Procedure Laterality Date  . APPENDECTOMY    . BREAST ENHANCEMENT SURGERY    . BREAST IMPLANT REMOVAL    . CHOLECYSTECTOMY    .  COLONOSCOPY    . FOOT SURGERY  11/06/2016  . MASTECTOMY     bilateral for severe bilateral fibrocystic disease  . OOPHORECTOMY    . s/p neck lump removal    . SHOULDER SURGERY    . SINUS ENDO W/FUSION Right 06/30/2013   Procedure: RIGHT ENDOSCOPIC SPHENOIDECTOMY WITH FUSION SCAN;  Surgeon: [name], MD;  Location: [facility];  Service: ENT;  Laterality: Right;  . TONSILLECTOMY    . VAGINAL HYSTERECTOMY       OB History   No obstetric history on file.     Family History  Problem Relation Age of Onset  . Diabetes Mother   . Heart disease Father   . Diabetes Father   . Diabetes Sister   . Breast cancer Maternal Grandmother   . Heart disease Maternal Uncle        x 2  . Heart disease Maternal Aunt   . Kidney disease Paternal Uncle        questionable  . Heart disease Son   . Healthy Son   . Irritable bowel syndrome Son   . Colon cancer Neg Hx   . Colon polyps Neg Hx   . Rectal cancer Neg Hx   . Stomach cancer Neg Hx     Social History   Tobacco Use  . Smoking status: Never Smoker  . Smokeless tobacco: Never Used  Substance Use Topics  . Alcohol use: No  . Drug use: No    Home Medications Prior to Admission medications   Medication Sig Start Date End Date Taking? Authorizing Provider  acetaminophen (TYLENOL) 500 MG tablet Take 1,000 mg by mouth every 6 (six) hours as needed for mild pain, moderate pain, fever or headache.    [provider]  ALPRAZolam Duanne Moron) 0.5 MG tablet TAKE 1 TABLET(0.5 MG) BY MOUTH THREE TIMES DAILY AS NEEDED FOR ANXIETY 08/29/19   Binnie Rail, MD  AMBULATORY NON [facility] MEDICATION [place] 12/13/17   Lyndal Pulley, DO  AMBULATORY NON FORMULARY MEDICATION Adjustable bed 07/11/18   Lyndal Pulley, DO  Amino Acids (AMINO ACID PO) Take 1 capsule by mouth 2 (two) times daily.    [provider]  Ascorbic Acid (VITAMIN C) 1000 MG tablet Take 1,000 mg by mouth 2 (two) times daily.    [provider]  B  Complex-Biotin-FA (B COMPLETE) TABS Take 1 tablet by mouth daily. B Complete 100mg     [provider]  benzonatate (TESSALON) 200 MG capsule Take 1 capsule (200 mg total) by mouth 3 (three) times daily as needed for cough. 08/22/19   Binnie Rail, MD  buPROPion (WELLBUTRIN XL) 300 MG 24 hr tablet TAKE 1 TABLET(300 MG) BY MOUTH DAILY 06/30/19   Binnie Rail, MD  Cholecalciferol (VITAMIN D3) 1000 units CAPS Take 1,000 Units by mouth daily.    [provider]  Coenzyme Q10 (CO Q-10) 200 MG CAPS Take 1 capsule  by mouth 2 (two) times daily.    [provider]  ECHINACEA PO Take 1 tablet by mouth 2 (two) times daily.    [provider]  ELDERBERRY PO Take by mouth daily.    [provider]  estradiol (ESTRACE) 0.5 MG tablet Take 1 tablet (0.5 mg total) by mouth daily. 04/10/19   Binnie Rail, MD  GOLDEN SEAL PO Take by mouth daily.    [provider]  Nyoka Cowden Tea, Camellia sinensis, (EGCG) POWD Take 1 capsule by mouth daily.    [provider]  Homeopathic Products (ARNICA [place]) PLLT Take by mouth daily.    [provider]  KRILL OIL PO Take by mouth daily.    [provider]  LECITHIN PO Take 1 tablet by mouth daily.    [provider]  levothyroxine (SYNTHROID) 75 MCG tablet TAKE 1 TABLET BY MOUTH EVERY DAY IN THE MORNING 05/05/19   Burns, Claudina Lick, MD  MAGNESIUM ASPARTATE PO Take 1 tablet by mouth 2 (two) times daily.    [provider]  meclizine (ANTIVERT) 25 MG tablet Take 1 tablet (25 mg total) by mouth 3 (three) times daily as needed for dizziness. 02/20/19   Binnie Rail, MD  Misc. Devices (ROLLATOR) MISC Use rollator for ambulation 12/20/17   Lyndal Pulley, DO  Multiple Vitamins-Minerals (ZINC PO) Take by mouth daily.    [provider]  ondansetron (ZOFRAN) 4 MG tablet TAKE 1 TABLET(4 MG) BY MOUTH EVERY 8 HOURS AS NEEDED FOR NAUSEA OR VOMITING 05/06/18   Burns, Claudina Lick, MD    ondansetron (ZOFRAN-ODT) 8 MG disintegrating tablet Take 1 tablet (8 mg total) by mouth every 8 (eight) hours as needed for nausea or vomiting. 08/11/17   Binnie Rail, MD  POTASSIUM AMINOBENZOATE PO Take 1 tablet by mouth daily.    [provider]  prochlorperazine (COMPAZINE) 10 MG tablet Take 1 tablet (10 mg total) by mouth every 6 (six) hours as needed for nausea or vomiting. 02/10/19   Binnie Rail, MD  triamterene-hydrochlorothiazide (MAXZIDE-25) 37.5-25 MG tablet TAKE 1 TABLET BY MOUTH DAILY 05/05/19   Binnie Rail, MD  TURMERIC PO Take 1 tablet by mouth 2 (two) times daily.     [provider]  vitamin E 400 UNIT capsule Take 400 Units by mouth daily.    [provider]    Allergies    Prednisone, Amoxicillin-pot clavulanate, Aspirin, Erythromycin, Levofloxacin, Nsaids, Statins, Sumatriptan, Adhesive [tape], Ivp dye [iodinated diagnostic agents], Methylphenidate hcl, Mirtazapine, Red yeast rice [cholestin], Shellfish allergy, Soy allergy, Doxycycline, Hydrocodone, Hydrocodone-acetaminophen, Latex, Rofecoxib, Sertraline hcl, and Sulfonamide derivatives  Review of Systems   Review of Systems  Musculoskeletal: Positive for arthralgias, back pain, gait problem and myalgias.  Skin: Negative for wound.  Neurological: Negative for weakness and numbness.  All other systems reviewed and are negative.   Physical Exam Updated Vital Signs BP (!) 134/94 (BP Location: Left Arm)   Pulse 87   Temp 98.9 F (37.2 C) (Oral)   Resp 17   SpO2 97%   Physical Exam Vitals and nursing note reviewed.  Constitutional:      General: She is not in acute distress.    Appearance: Normal appearance. She is well-developed. She is not ill-appearing.     Comments: Calm and cooperative  HENT:     Head: Normocephalic and atraumatic.  Eyes:     General: No scleral icterus.       Right eye:  No discharge.        Left eye: No discharge.     Conjunctiva/sclera: Conjunctivae  normal.     Pupils: Pupils are equal, round, and reactive to light.  Cardiovascular:     Rate and Rhythm: Normal rate.  Pulmonary:     Effort: Pulmonary effort is normal. No respiratory distress.  Abdominal:     General: There is no distension.  Musculoskeletal:     Cervical back: Normal range of motion.     Comments: Back: Inspection: No masses, deformity, or rash Palpation: Mild lower lumbar spinal tenderness. No paraspinal muscle tenderness. Strength: 5/5 in right lower extremity. Pt refuses to move her LLE. Normal plantar and dorsiflexion Sensation: Intact sensation with light touch in lower extremities bilaterally Reflexes: Patellar reflex is 2+ on the right. Pt will not relax the left to perform adequate testing SLR: Positive seated straight leg raise on the left Gait: Not tested   Skin:    General: Skin is warm and dry.  Neurological:     Mental Status: She is alert and oriented to person, place, and time.  Psychiatric:        Behavior: Behavior normal.     ED Results / Procedures / Treatments   Labs (all labs ordered are listed, but only abnormal results are displayed) Labs Reviewed - No data to display  EKG None  Radiology No results found.  Procedures Procedures (including critical care time)  Medications Ordered in ED Medications - No data to display  ED Course  I have reviewed the triage vital signs and the nursing notes.  Pertinent labs & imaging results that were available during my care of the patient were reviewed by me and considered in my medical decision making (see chart for details).  79 year old female with severe left leg pain radiating from her back to her foot for the past 3 weeks.  Patient gives poor effort on exam but is noted to be ambulatory.  Vital signs are reassuring.  She is calm and comfortable appearing and playing games on her phone.  Per chart review she has been back and forth between her doctor and pain specialist without any  relief of her symptoms.  Patient has multiple medication allergies limiting pain control options.  We will try IM Dilaudid and IM Decadron.  Patient has MRI scheduled as an outpatient.  She has no signs or symptoms concerning for cauda equina today and I do not feel she needs an emergent one.  On recheck the patient she states that she did not get any relief in pain.  We discussed that pain control options are limited due to her multiple allergies.  We will try gabapentin for neuropathic pain.  She states that she does not think she has an allergy to this and I do not see it in her allergy list.  She has an appointment with her PCP coming up next week.  Advised to follow-up and to schedule her MRI as an outpatient.  MDM Rules/Calculators/A&P                     Final Clinical Impression(s) / ED Diagnoses Final diagnoses:  Sciatica of left side    Rx / DC Orders ED Discharge Orders    None       Recardo Evangelist, PA-C 09/15/19 [place], [place], DO 09/15/19 1636

## 2019-09-15 NOTE — Discharge Instructions (Addendum)
Start Gabapentin - take three times a day for the next 10 days. If this medicine helps you can get refills from your doctor Please schedule the MRI of your back as an outpatient Follow up with Dr. Quay Burow

## 2019-09-15 NOTE — ED Triage Notes (Signed)
Pt BIBA from home-  Per EMS- Pt reports left leg pain x2 days, worsening. Pt reports epidural for pain one week ago, hx of fibromyalgia. Ambulatory on scene. Aox4.

## 2019-09-17 NOTE — Procedures (Signed)
Lumbosacral Transforaminal Epidural Steroid Injection - Sub-Pedicular Approach with Fluoroscopic Guidance  Patient: Laura Barnes      Date of Birth: November 11, 1940 MRN: [id number] PCP: Binnie Rail, MD      Visit Date: 09/06/2019   Universal Protocol:    Date/Time: 09/06/2019  Consent Given By: the patient  Position: PRONE  Additional Comments: Vital signs were monitored before and after the procedure. Patient was prepped and draped in the usual sterile fashion. The correct patient, procedure, and site was verified.   Injection Procedure Details:  Procedure Site One Meds Administered:  Meds ordered this encounter  Medications  . methylPREDNISolone acetate (DEPO-MEDROL) injection 40 mg  . HYDROcodone-acetaminophen (NORCO/VICODIN) 5-325 MG tablet    Sig: Take 1 tablet by mouth every 8 (eight) hours as needed for up to 5 days for moderate pain or severe pain.    Dispense:  15 tablet    Refill:  0    Laterality: Left  Location/Site:  L3-L4  Needle size: 78 G  Needle type: Spinal  Needle Placement: Transforaminal  Findings:    -Comments: Excellent flow of contrast along the nerve and into the epidural space.  Procedure Details: After squaring off the end-plates to get a true AP view, the C-arm was positioned so that an oblique view of the foramen as noted above was visualized. The target area is just inferior to the "nose of the scotty dog" or sub pedicular. The soft tissues overlying this structure were infiltrated with 2-3 ml. of 1% Lidocaine without Epinephrine.  The spinal needle was inserted toward the target using a "trajectory" view along the fluoroscope beam.  Under AP and lateral visualization, the needle was advanced so it did not puncture dura and was located close the 6 O'Clock position of the pedical in AP tracterory. Biplanar projections were used to confirm position. Aspiration was confirmed to be negative for CSF and/or blood. A 1-2 ml. volume of  Isovue-250 was injected and flow of contrast was noted at each level. Radiographs were obtained for documentation purposes.   After attaining the desired flow of contrast documented above, a 0.5 to 1.0 ml test dose of 0.25% Marcaine was injected into each respective transforaminal space.  The patient was observed for 90 seconds post injection.  After no sensory deficits were reported, and normal lower extremity motor function was noted,   the above injectate was administered so that equal amounts of the injectate were placed at each foramen (level) into the transforaminal epidural space.   Additional Comments:  The patient tolerated the procedure well Dressing: 2 x 2 sterile gauze and Band-Aid    Post-procedure details: Patient was observed during the procedure. Post-procedure instructions were reviewed.  Patient left the clinic in stable condition.

## 2019-09-17 NOTE — Progress Notes (Signed)
Laura Barnes - 79 y.o. female MRN [id number]  Date of birth: 1940/09/28  Office Visit Note: Visit Date: 09/06/2019 PCP: Binnie Rail, MD Referred by: Binnie Rail, MD  Subjective: Chief Complaint  Patient presents with  . Lower Back - Pain  . Left Leg - Pain, Numbness  . Left Foot - Pain, Numbness   HPI:  Laura Barnes is a 79 y.o. female who comes in today For planned repeat lumbar transforaminal epidural steroid injection.  Patient is having left radicular pain somewhat into the groin or thigh anteriorly but also back of the leg and really multiple dermatomes.  She does suffer from fibromyalgia and I think the underlying fibromyalgia is significant at this point to modify the pain level that she is having and where she is having it.  She does have a degenerative spine with scoliosis and right-sided foraminal narrowing particular at L2-3.  In the past we had completed right-sided injection a few years ago with really good relief.  I saw her in February and completed L4 epidural injection and she says she got 100% relief but it was short-lived.  Again somewhat mixed dermatomal referral pattern.  I think at this point we will complete left L3 transforaminal injection at that level based on the fact that she is getting some referral to the thigh anteriorly.  Unfortunately she is is really having a lot of pain complaints overall and she feels like this is a new pain and its more severe.  She reports that she feels weak but no focal weakness no bowel or bladder changes.  She is very distraught with the pain at this point.  She reports 10 out of 10 pain and is really limiting what she can do and she states that any movement whatsoever worsens it.  She has multiple drug intolerances and does list a contrast allergy but in point of fact to be documented she does well with contrast for the epidural.  She also has allergies to anti-inflammatories and a lot of pain medications.  She is  followed by Dr. Estanislado Pandy and Hazel Sams, PA-C.  In the past she is actually seeing Dr. Greta Doom from a chronic pain standpoint and that was in March of last year but does not seem to see her now release there is no medications written for her.  She does take Xanax and she has taken some tramadol in the past.  Again no red flag complaints other than excessive amount of pain and the fact that it is really changing the way her life is right now.  Review of Systems  Constitutional: Negative for chills, fever, malaise/fatigue and weight loss.  HENT: Negative for hearing loss and sinus pain.   Eyes: Negative for blurred vision, double vision and photophobia.  Respiratory: Negative for cough and shortness of breath.   Cardiovascular: Negative for chest pain, palpitations and leg swelling.  Gastrointestinal: Negative for abdominal pain, nausea and vomiting.  Genitourinary: Negative for flank pain.  Musculoskeletal: Positive for back pain and joint pain. Negative for myalgias.  Skin: Negative for itching and rash.  Neurological: Negative for tremors, focal weakness and weakness.  Endo/Heme/Allergies: Negative.   Psychiatric/Behavioral: Negative for depression.  All other systems reviewed and are negative.  Otherwise per HPI.  Assessment & Plan: Visit Diagnoses:  1. Lumbar radiculopathy   2. Scoliosis of thoracolumbar spine, unspecified scoliosis type   3. Radiculopathy due to lumbar intervertebral disc disorder   4. Pain in left hip  Plan: Findings:  Left-sided radicular complaints somewhat of an L2 or L3 distribution somewhat more L5 or S1.  She does have degenerative scoliosis with right-sided foraminal narrowing on prior MRI.  Prior right-sided injection a few years ago was very successful.  Last injection at L4 based on the old MRI gave her 100% relief evidently but it was short-lived.  Now her pain is worse than ever and she really seems to be indicating how much suffering she is having.  She  does have a follow-up with Dr. Estanislado Pandy soon.  I wonder how much the fibromyalgia is involved in this.  I did agree to write a small amount of hydrocodone to see if we can help her out with her pain level until the injection starts to work.  We did complete left L3 transforaminal injection today.  Exam was really nonfocal from a nerve standpoint.  She does need updated MRI if this continues to give her a lot of pain.  She may ultimately be someone who is either a candidate for surgical consideration or potentially for tertiary care pain management may be at [facility] through [place].    Meds & Orders:  Meds ordered this encounter  Medications  . methylPREDNISolone acetate (DEPO-MEDROL) injection 40 mg  . HYDROcodone-acetaminophen (NORCO/VICODIN) 5-325 MG tablet    Sig: Take 1 tablet by mouth every 8 (eight) hours as needed for up to 5 days for moderate pain or severe pain.    Dispense:  15 tablet    Refill:  0    Orders Placed This Encounter  Procedures  . XR C-ARM NO REPORT  . Epidural Steroid injection    Follow-up: Return if symptoms worsen or fail to improve, for If no relief needs MRI of the lumbar spine.   Procedures: No procedures performed  Lumbosacral Transforaminal Epidural Steroid Injection - Sub-Pedicular Approach with Fluoroscopic Guidance  Patient: Laura Barnes      Date of Birth: 1941/03/16 MRN: [id number] PCP: Binnie Rail, MD      Visit Date: 09/06/2019   Universal Protocol:    Date/Time: 09/06/2019  Consent Given By: the patient  Position: PRONE  Additional Comments: Vital signs were monitored before and after the procedure. Patient was prepped and draped in the usual sterile fashion. The correct patient, procedure, and site was verified.   Injection Procedure Details:  Procedure Site One Meds Administered:  Meds ordered this encounter  Medications  . methylPREDNISolone acetate (DEPO-MEDROL) injection 40 mg  .  HYDROcodone-acetaminophen (NORCO/VICODIN) 5-325 MG tablet    Sig: Take 1 tablet by mouth every 8 (eight) hours as needed for up to 5 days for moderate pain or severe pain.    Dispense:  15 tablet    Refill:  0    Laterality: Left  Location/Site:  L3-L4  Needle size: 52 G  Needle type: Spinal  Needle Placement: Transforaminal  Findings:    -Comments: Excellent flow of contrast along the nerve and into the epidural space.  Procedure Details: After squaring off the end-plates to get a true AP view, the C-arm was positioned so that an oblique view of the foramen as noted above was visualized. The target area is just inferior to the "nose of the scotty dog" or sub pedicular. The soft tissues overlying this structure were infiltrated with 2-3 ml. of 1% Lidocaine without Epinephrine.  The spinal needle was inserted toward the target using a "trajectory" view along the fluoroscope beam.  Under AP and lateral visualization, the  needle was advanced so it did not puncture dura and was located close the 6 O'Clock position of the pedical in AP tracterory. Biplanar projections were used to confirm position. Aspiration was confirmed to be negative for CSF and/or blood. A 1-2 ml. volume of Isovue-250 was injected and flow of contrast was noted at each level. Radiographs were obtained for documentation purposes.   After attaining the desired flow of contrast documented above, a 0.5 to 1.0 ml test dose of 0.25% Marcaine was injected into each respective transforaminal space.  The patient was observed for 90 seconds post injection.  After no sensory deficits were reported, and normal lower extremity motor function was noted,   the above injectate was administered so that equal amounts of the injectate were placed at each foramen (level) into the transforaminal epidural space.   Additional Comments:  The patient tolerated the procedure well Dressing: 2 x 2 sterile gauze and Band-Aid    Post-procedure  details: Patient was observed during the procedure. Post-procedure instructions were reviewed.  Patient left the clinic in stable condition.     Clinical History: MRI LUMBAR SPINE WITHOUT CONTRAST  TECHNIQUE: Multiplanar, multisequence MR imaging of the lumbar spine was performed. No intravenous contrast was administered.  COMPARISON:  Multiple exams, including 04/14/2010  FINDINGS: Segmentation: The lowest lumbar type non-rib-bearing vertebra is labeled as L5.  Alignment: Levoconvex lumbar scoliosis centered at the L3 level. The mild rotary component. 5 mm of degenerative retrolisthesis at L2-3 with 4 mm degenerative anterolisthesis at L3-4 and 3 mm degenerative anterolisthesis at L4-5.  Vertebrae: Type 3 degenerative endplate findings at X33443 and type 2 degenerative endplate findings at X33443 and L5-S1 with considerable loss of intervertebral disc height at all 3 levels. Facet and perifacet edema on the left at L5-S1.  Conus medullaris and cauda equina: Conus extends to the L1 level. Conus and cauda equina appear normal.  Paraspinal and other soft tissues: A fluid signal intensity lesion of the right kidney is partially included on today's exam and statistically likely to represent a cyst although technically nonspecific.  Disc levels:  T11-12: No impingement.  Small right perineural cyst.  T12-L1: No impingement.  Bilateral perineural cysts.  L1-2: No impingement.  Mild disc bulge.  Bilateral perineural cysts.  L2-3: Prominent right foraminal stenosis with prominent right subarticular lateral recess stenosis and moderate right eccentric central narrowing of the thecal sac, with moderate right and mild left displacement of the L2 nerves in the lateral extraforaminal space, due to a large right lateral recess and foraminal disc protrusion, facet arthropathy, intervertebral spurring, and the degenerative retrolisthesis at this level. The disc  protrusion extends caudad about 1.3 cm in the right lateral recess, and this disc protrusion is shown on image 9/6.  L3-4: Moderate right and mild left foraminal stenosis with borderline right subarticular lateral recess stenosis due to disc uncovering, facet spurring, and intervertebral spurring  L4-5: Mild bilateral subarticular lateral recess stenosis due to disc bulge, facet arthropathy, and ligamentum flavum redundancy. Small bilateral facet joint effusions.  L5-S1: Borderline bilateral subarticular lateral recess stenosis due to disc osteophyte complex. Small left paracentral disc protrusion.  IMPRESSION: 1. Lumbar spondylosis, scoliosis, and degenerative disc disease with multilevel new subluxations the compared to the prior lumbar MRI. The dominant finding is a large right lateral recess and foraminal disc protrusion at the L2-3 level, extending caudad. These factors result in prominent impingement at L2-3; moderate impingement at L3-4; and mild impingement at L4-5, as detailed above.   Electronically  Signed   By: Van Clines M.D.   On: 07/25/2017 17:39     Objective:  VS:  HT:    WT:   BMI:     BP:137/82  HR:(!) 104bpm  TEMP: ( )  RESP:  Physical Exam Vitals and nursing note reviewed.  Constitutional:      General: She is not in acute distress.    Appearance: Normal appearance. She is well-developed. She is not ill-appearing.  HENT:     Head: Normocephalic and atraumatic.  Eyes:     Conjunctiva/sclera: Conjunctivae normal.     Pupils: Pupils are equal, round, and reactive to light.  Cardiovascular:     Rate and Rhythm: Normal rate.     Pulses: Normal pulses.  Pulmonary:     Effort: Pulmonary effort is normal.  Musculoskeletal:        General: Tenderness present. No swelling or deformity.     Right lower leg: No edema.     Left lower leg: No edema.     Comments: Any type of movement seems to elicit a great deal of pain that seems to be  exaggerated for the amount of movement.  I do believe she is in a great deal of pain to a degree but it seems like with any movement she hurts.  She is able to ambulate.  She has no focal weakness.  She has no hip pain with rotation.  Skin:    General: Skin is warm and dry.     Findings: No erythema or rash.  Neurological:     General: No focal deficit present.     Mental Status: She is alert and oriented to person, place, and time.     Sensory: No sensory deficit.     Motor: No weakness or abnormal muscle tone.     Coordination: Coordination normal.     Gait: Gait normal.  Psychiatric:     Comments: Patient very distressed and appearing in great pain.     Ortho Exam Imaging: No results found.

## 2019-09-18 ENCOUNTER — Encounter: Payer: Self-pay | Admitting: Internal Medicine

## 2019-09-18 ENCOUNTER — Ambulatory Visit (INDEPENDENT_AMBULATORY_CARE_PROVIDER_SITE_OTHER): Payer: Medicare Other | Admitting: Internal Medicine

## 2019-09-18 ENCOUNTER — Other Ambulatory Visit: Payer: Self-pay

## 2019-09-18 VITALS — BP 164/86 | HR 81 | Temp 98.3°F | Resp 16 | Ht 59.75 in

## 2019-09-18 DIAGNOSIS — M5416 Radiculopathy, lumbar region: Secondary | ICD-10-CM | POA: Diagnosis not present

## 2019-09-18 MED ORDER — HYDROMORPHONE HCL 4 MG PO TABS: 4.0000 mg | ORAL_TABLET | ORAL | 0 refills | Status: DC | PRN

## 2019-09-18 NOTE — Assessment & Plan Note (Signed)
Acute Lumbar radiculopathy on left Has seen Dr Ernestina Patches, Rheumatology and been to the ED Pain is severe MRI ordered by rheum - she will call to schedule Vicodin, gabapentin, steroid injections have not helped pain Will prescribe dilaudid for 5 days only - discussed with her I will not prescribe this more than short term [place] controlled substance database checked.

## 2019-09-18 NOTE — Patient Instructions (Signed)
Take the hydromorphone as needed for severe pain.  If you need additional pain medication you will need to establish with pain management.     Get the MRI scheduled

## 2019-09-18 NOTE — Progress Notes (Signed)
Subjective:    Patient ID: Laura Barnes, female    DOB: 03/16/1941, 79 y.o.   MRN: [id number]  HPI The patient is here for an acute visit for leg pain.  Started about 3 weeks ago.  She was hurting bad.  Was in pain when she saw dr Ernestina Patches.  Had an epidural.  Helped with fibro. Did not help with left leg pain. Did get vicodin, but this does not help.   Saw rheum 4/14 and had a shot for pyriformis.  She can not prescribe pain medication.  She ended up going to the ED and received dexamethasone injection, hydromorphone, lidocaine patch.  She was discharged with gabapentin, which does nothing.     Pain is in whole left leg.  Starts in left lower back and goes to bottom of foot - covers entire leg - front, back, sides.  Area of left lower anterior leg is numb.  Pain is severe.  She has pain in every position.  She has fallen 6-7 times.  She is not sure if her leg is weak or why she fell.  She needs some relief from the pain.  Rheumatology ordered a MRI and she will call to get that scheduled.  She is not sure if she can lay flat to get the MRI.    Medications and allergies reviewed with patient and updated if appropriate.  Patient Active Problem List   Diagnosis Date Noted  . 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 Scoliosis of thoracolumbar spine 07/11/2019  . Laceration of left hand without foreign body 03/24/2019  . Choking 03/24/2019  . Hypokalemia 03/24/2019  . Fatigue 12/31/2018  . Leg swelling 09/14/2018  . Degenerative arthritis of knee, bilateral 07/05/2018  . Protrusion of lumbar intervertebral disc 04/20/2018  . Difficulty urinating 11/24/2017  . Dizziness 08/11/2017  . Sacroiliac pain 06/17/2017  . Greater trochanteric bursitis of right hip 05/27/2017  . Vertigo 04/29/2017  . Dysphagia 10/28/2016  . Lump of skin 05/11/2016  . 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 Chronic meniscal tear of knee 03/05/2016  . Nausea 03/04/2016  . Osteopenia  12/03/2015  . Thyroid nodule 11/16/2015  . Bilateral leg edema 11/05/2015  . Anterior neck pain 11/05/2015  . Hormone replacement therapy (HRT) 11/05/2015  . Subacromial bursitis 09/16/2015  . 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 Trochanteric bursitis of both hips 03/12/2015  . Subacromial impingement of left shoulder 03/12/2015  . Hyperlipidemia 05/01/2014  . Chronic pain syndrome 05/01/2014  . Lumbar radiculopathy 03/27/2014  . Left shoulder pain 11/07/2013  . Degeneration of intervertebral disc of cervical region 10/10/2013  . Fibromyalgia 09/06/2013  . Bilateral shoulder pain 09/06/2013  . Low back pain 08/12/2013  . Chronic cough 04/12/2013  . Sinusitis, chronic 04/12/2013  . Hypothyroidism 04/07/2012  . Chronic venous insufficiency 04/02/2010  . Enthesopathy of hip region 07/19/2007  . Osteoporosis 07/19/2007  . Anxiety 01/23/2007  . Depression 01/23/2007  . Migraine 01/23/2007  . NINAR (noninfectious nonallergic rhinitis) 01/23/2007  . Gastroesophageal reflux disease 01/23/2007  . IBS 01/23/2007    Current Outpatient Medications on File Prior to Visit  Medication Sig Dispense Refill  . acetaminophen (TYLENOL) 500 MG tablet Take 1,000 mg by mouth every 6 (six) hours as needed for mild pain, moderate pain, fever or headache.    . ALPRAZolam (XANAX) 0.5 MG tablet TAKE 1 TABLET(0.5 MG) BY MOUTH THREE TIMES DAILY AS NEEDED FOR ANXIETY 90 tablet 0  .  AMBULATORY NON FORMULARY MEDICATION Walker 1 Device 0  . AMBULATORY NON FORMULARY MEDICATION Adjustable bed 1 Device 0  . Amino Acids (AMINO ACID PO) Take 1 capsule by mouth 2 (two) times daily.    . Ascorbic Acid (VITAMIN C) 1000 MG tablet Take 1,000 mg by mouth 2 (two) times daily.    . B Complex-Biotin-FA (B COMPLETE) TABS Take 1 tablet by mouth daily. B Complete 100mg     . benzonatate (TESSALON) 200 MG capsule Take 1 capsule (200 mg total) by mouth 3 (three) times daily as needed for  cough. 90 capsule 5  . buPROPion (WELLBUTRIN XL) 300 MG 24 hr tablet TAKE 1 TABLET(300 MG) BY MOUTH DAILY 90 tablet 1  . Cholecalciferol (VITAMIN D3) 1000 units CAPS Take 1,000 Units by mouth daily.    . Coenzyme Q10 (CO Q-10) 200 MG CAPS Take 1 capsule by mouth 2 (two) times daily.    Marland Kitchen ECHINACEA PO Take 1 tablet by mouth 2 (two) times daily.    Marland Kitchen ELDERBERRY PO Take by mouth daily.    Marland Kitchen estradiol (ESTRACE) 0.5 MG tablet Take 1 tablet (0.5 mg total) by mouth daily. 90 tablet 1  . gabapentin (NEURONTIN) 300 MG capsule Take 1 capsule (300 mg total) by mouth 3 (three) times daily. 30 capsule 0  . GOLDEN SEAL PO Take by mouth daily.    Nyoka Cowden Tea, Camellia sinensis, (EGCG) POWD Take 1 capsule by mouth daily.    . Homeopathic Products (ARNICA [place]) PLLT Take by mouth daily.    Marland Kitchen KRILL OIL PO Take by mouth daily.    Marland Kitchen LECITHIN PO Take 1 tablet by mouth daily.    Marland Kitchen levothyroxine (SYNTHROID) 75 MCG tablet TAKE 1 TABLET BY MOUTH EVERY DAY IN THE MORNING 90 tablet 1  . MAGNESIUM ASPARTATE PO Take 1 tablet by mouth 2 (two) times daily.    . meclizine (ANTIVERT) 25 MG tablet Take 1 tablet (25 mg total) by mouth 3 (three) times daily as needed for dizziness. 60 tablet 5  . Misc. Devices (ROLLATOR) MISC Use rollator for ambulation 1 each 0  . Multiple Vitamins-Minerals (ZINC PO) Take by mouth daily.    . ondansetron (ZOFRAN) 4 MG tablet TAKE 1 TABLET(4 MG) BY MOUTH EVERY 8 HOURS AS NEEDED FOR NAUSEA OR VOMITING 30 tablet 0  . ondansetron (ZOFRAN-ODT) 8 MG disintegrating tablet Take 1 tablet (8 mg total) by mouth every 8 (eight) hours as needed for nausea or vomiting. 30 tablet 2  . POTASSIUM AMINOBENZOATE PO Take 1 tablet by mouth daily.    . prochlorperazine (COMPAZINE) 10 MG tablet Take 1 tablet (10 mg total) by mouth every 6 (six) hours as needed for nausea or vomiting. 30 tablet 0  . triamterene-hydrochlorothiazide (MAXZIDE-25) 37.5-25 MG tablet TAKE 1 TABLET BY MOUTH DAILY 90 tablet 1  . TURMERIC  PO Take 1 tablet by mouth 2 (two) times daily.     . vitamin E 400 UNIT capsule Take 400 Units by mouth daily.     No current facility-administered medications on file prior to visit.    Past Medical History:  Diagnosis Date  . ALLERGIC RHINITIS   . Allergy   . Anemia   . ANXIETY   . Arthritis    hands  . Bronchitis   . Chronic fatigue fibromyalgia syndrome   . Complication of anesthesia    says one time waking up she couldn't breathe, felt like her throat closing up  . DEPRESSION   .  Enthesopathy of hip region   . Family history of anesthesia complication    sister with n/v  . FOOT PAIN, BILATERAL   . GERD   . Hiatal hernia   . HYPERLIPIDEMIA   . Hypothyroidism   . IBS   . MIGRAINE HEADACHE   . MIGRAINE, COMMON   . NECK MASS   . OSTEOPENIA   . OSTEOPOROSIS   . PLANTAR FASCIITIS   . RASH-NONVESICULAR   . SINUSITIS- ACUTE-NOS   . SKIN LESION   . VAGINITIS   . VENOUS INSUFFICIENCY, CHRONIC     Past Surgical History:  Procedure Laterality Date  . APPENDECTOMY    . BREAST ENHANCEMENT SURGERY    . BREAST IMPLANT REMOVAL    . CHOLECYSTECTOMY    . COLONOSCOPY    . FOOT SURGERY  11/06/2016  . MASTECTOMY     bilateral for severe bilateral fibrocystic disease  . OOPHORECTOMY    . s/p neck lump removal    . SHOULDER SURGERY    . SINUS ENDO W/FUSION Right 06/30/2013   Procedure: RIGHT ENDOSCOPIC SPHENOIDECTOMY WITH FUSION SCAN;  Surgeon: [name], MD;  Location: [facility];  Service: ENT;  Laterality: Right;  . TONSILLECTOMY    . VAGINAL HYSTERECTOMY      Social History   Socioeconomic History  . Marital status: Widowed    Spouse name: Not on file  . Number of children: 2  . Years of education: Not on file  . Highest education level: Not on file  Occupational History  . Occupation: retired Press photographer  Tobacco Use  . Smoking status: Never Smoker  . Smokeless tobacco: Never Used  Substance and Sexual Activity  . Alcohol use: No  . Drug use: No  . Sexual  activity: Not Currently  Other Topics Concern  . Not on file  Social History Narrative   Has 2 biological children and 1 step child   Social Determinants of Health   Financial Resource Strain:   . Difficulty of Paying Living Expenses:   Food Insecurity:   . Worried About Charity fundraiser in the Last Year:   . Arboriculturist in the Last Year:   Transportation Needs:   . Film/video editor (Medical):   Marland Kitchen Lack of Transportation (Non-Medical):   Physical Activity:   . Days of Exercise per Week:   . Minutes of Exercise per Session:   Stress:   . Feeling of Stress :   Social Connections: Unknown  . Frequency of Communication with Friends and Family: Not on file  . Frequency of Social Gatherings with Friends and Family: Not on file  . Attends Religious Services: Not on file  . Active Member of Clubs or Organizations: Yes  . Attends Archivist Meetings: Not on file  . Marital Status: Not on file    Family History  Problem Relation Age of Onset  . Diabetes Mother   . Heart disease Father   . Diabetes Father   . Diabetes Sister   . Breast cancer Maternal Grandmother   . Heart disease Maternal Uncle        x 2  . Heart disease Maternal Aunt   . Kidney disease Paternal Uncle        questionable  . Heart disease Son   . Healthy Son   . Irritable bowel syndrome Son   . Colon cancer Neg Hx   . Colon polyps Neg Hx   . Rectal cancer Neg Hx   .  Stomach cancer Neg Hx     Review of Systems Per HPI    Objective:   Vitals:   09/18/19 1418  BP: (!) 164/86  Pulse: 81  Resp: 16  Temp: 98.3 F (36.8 C)  SpO2: 99%   BP Readings from Last 3 Encounters:  09/18/19 (!) 164/86  09/15/19 (!) 143/78  09/13/19 137/75   Wt Readings from Last 3 Encounters:  09/13/19 129 lb 12.8 oz (58.9 kg)  07/07/19 128 lb 6.4 oz (58.2 kg)  06/26/19 128 lb (58.1 kg)   Body mass index is 25.56 kg/m.   Physical Exam Constitutional:      General: She is not in acute  distress.    Appearance: Normal appearance. She is not ill-appearing.  HENT:     Head: Normocephalic and atraumatic.  Musculoskeletal:        General: Tenderness (tenderness just left of lower lumbar spine and left SI region) present. No deformity.     Right lower leg: No edema.     Left lower leg: No edema.  Skin:    General: Skin is warm and dry.  Neurological:     Mental Status: She is alert.     Sensory: Sensory deficit (left anterior lower leg) present.     Motor: No weakness.            Assessment & Plan:    See Problem List for Assessment and Plan of chronic medical problems.    This visit occurred during the SARS-CoV-2 public health emergency.  Safety protocols were in place, including screening questions prior to the visit, additional usage of staff PPE, and extensive cleaning of exam room while observing appropriate contact time as indicated for disinfecting solutions.

## 2019-09-25 ENCOUNTER — Encounter: Payer: Self-pay | Admitting: Internal Medicine

## 2019-09-25 ENCOUNTER — Telehealth: Payer: Self-pay | Admitting: Internal Medicine

## 2019-09-25 MED ORDER — HYDROMORPHONE HCL 4 MG PO TABS: 4.0000 mg | ORAL_TABLET | ORAL | 0 refills | Status: DC | PRN

## 2019-09-25 NOTE — Telephone Encounter (Signed)
    Patient calling to report extreme back/hip pain. Requesting pain medication

## 2019-09-25 NOTE — Telephone Encounter (Signed)
Medication sent in - will send message via mychart

## 2019-09-26 ENCOUNTER — Ambulatory Visit: Payer: Medicare Other | Admitting: Rheumatology

## 2019-09-26 NOTE — Progress Notes (Signed)
Subjective:    Patient ID: Laura Barnes, female    DOB: 1940/10/06, 79 y.o.   MRN: [id number]  HPI She is here for a physical exam.   She continues to have severe pain in her left leg and lower back.  There has been no change.  The dilaudid eases off the pain just a little.  It is worse with any movement.  She has an MRI scheduled for Monday.  The pain is severe and sometimes she just cries.   Her anxiety is through the roof due to severe pain.    Medications and allergies reviewed with patient and updated if appropriate.  Patient Active Problem List   Diagnosis Date Noted  . 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 Scoliosis of thoracolumbar spine 07/11/2019  . Choking 03/24/2019  . Hypokalemia 03/24/2019  . Fatigue 12/31/2018  . Leg swelling 09/14/2018  . Degenerative arthritis of knee, bilateral 07/05/2018  . Protrusion of lumbar intervertebral disc 04/20/2018  . Difficulty urinating 11/24/2017  . Dizziness 08/11/2017  . Sacroiliac pain 06/17/2017  . Greater trochanteric bursitis of right hip 05/27/2017  . Vertigo 04/29/2017  . Dysphagia 10/28/2016  . Lump of skin 05/11/2016  . 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 Chronic meniscal tear of knee 03/05/2016  . Nausea 03/04/2016  . Osteopenia 12/03/2015  . Thyroid nodule 11/16/2015  . Bilateral leg edema 11/05/2015  . Anterior neck pain 11/05/2015  . Hormone replacement therapy (HRT) 11/05/2015  . Subacromial bursitis 09/16/2015  . 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 Trochanteric bursitis of both hips 03/12/2015  . Subacromial impingement of left shoulder 03/12/2015  . Hyperlipidemia 05/01/2014  . Chronic pain syndrome 05/01/2014  . Lumbar radiculopathy 03/27/2014  . Left shoulder pain 11/07/2013  . Degeneration of intervertebral disc of cervical region 10/10/2013  . Fibromyalgia 09/06/2013  . Bilateral shoulder pain 09/06/2013  . Chronic cough  04/12/2013  . Sinusitis, chronic 04/12/2013  . Hypothyroidism 04/07/2012  . Chronic venous insufficiency 04/02/2010  . Enthesopathy of hip region 07/19/2007  . Osteoporosis 07/19/2007  . Anxiety 01/23/2007  . Depression 01/23/2007  . Migraine 01/23/2007  . NINAR (noninfectious nonallergic rhinitis) 01/23/2007  . Gastroesophageal reflux disease 01/23/2007  . IBS 01/23/2007    Current Outpatient Medications on File Prior to Visit  Medication Sig Dispense Refill  . acetaminophen (TYLENOL) 500 MG tablet Take 1,000 mg by mouth every 6 (six) hours as needed for mild pain, moderate pain, fever or headache.    . ALPRAZolam (XANAX) 0.5 MG tablet TAKE 1 TABLET(0.5 MG) BY MOUTH THREE TIMES DAILY AS NEEDED FOR ANXIETY 90 tablet 0  . Amino Acids (AMINO ACID PO) Take 1 capsule by mouth 2 (two) times daily.    . Ascorbic Acid (VITAMIN C) 1000 MG tablet Take 1,000 mg by mouth 2 (two) times daily.    . B Complex-Biotin-FA (B COMPLETE) TABS Take 1 tablet by mouth daily. B Complete 100mg     . benzonatate (TESSALON) 200 MG capsule Take 1 capsule (200 mg total) by mouth 3 (three) times daily as needed for cough. 90 capsule 5  . buPROPion (WELLBUTRIN XL) 300 MG 24 hr tablet TAKE 1 TABLET(300 MG) BY MOUTH DAILY 90 tablet 1  . Cholecalciferol (VITAMIN D3) 1000 units CAPS Take 1,000 Units by mouth daily.    . Coenzyme Q10 (CO Q-10) 200 MG CAPS Take 1 capsule by mouth 2 (two) times daily.    Marland Kitchen ECHINACEA PO Take  1 tablet by mouth 2 (two) times daily.    Marland Kitchen ELDERBERRY PO Take by mouth daily.    Marland Kitchen estradiol (ESTRACE) 0.5 MG tablet Take 1 tablet (0.5 mg total) by mouth daily. 90 tablet 1  . gabapentin (NEURONTIN) 300 MG capsule Take 1 capsule (300 mg total) by mouth 3 (three) times daily. (Patient not taking: Reported on 09/27/2019) 30 capsule 0  . GOLDEN SEAL PO Take by mouth daily.    Nyoka Cowden Tea, Camellia sinensis, (EGCG) POWD Take 1 capsule by mouth daily.    . Homeopathic Products (ARNICA [place]) PLLT Take by  mouth daily.    Marland Kitchen HYDROmorphone (DILAUDID) 4 MG tablet Take 1 tablet (4 mg total) by mouth every 4 (four) hours as needed for severe pain (chronic back pain). 30 tablet 0  . KRILL OIL PO Take by mouth daily.    Marland Kitchen LECITHIN PO Take 1 tablet by mouth daily.    Marland Kitchen levothyroxine (SYNTHROID) 75 MCG tablet TAKE 1 TABLET BY MOUTH EVERY DAY IN THE MORNING 90 tablet 1  . MAGNESIUM ASPARTATE PO Take 1 tablet by mouth 2 (two) times daily.    . meclizine (ANTIVERT) 25 MG tablet Take 1 tablet (25 mg total) by mouth 3 (three) times daily as needed for dizziness. 60 tablet 5  . Misc. Devices (ROLLATOR) MISC Use rollator for ambulation 1 each 0  . Multiple Vitamins-Minerals (ZINC PO) Take by mouth daily.    . ondansetron (ZOFRAN) 4 MG tablet TAKE 1 TABLET(4 MG) BY MOUTH EVERY 8 HOURS AS NEEDED FOR NAUSEA OR VOMITING 30 tablet 0  . ondansetron (ZOFRAN-ODT) 8 MG disintegrating tablet Take 1 tablet (8 mg total) by mouth every 8 (eight) hours as needed for nausea or vomiting. 30 tablet 2  . POTASSIUM AMINOBENZOATE PO Take 1 tablet by mouth daily.    . prochlorperazine (COMPAZINE) 10 MG tablet Take 1 tablet (10 mg total) by mouth every 6 (six) hours as needed for nausea or vomiting. 30 tablet 0  . triamterene-hydrochlorothiazide (MAXZIDE-25) 37.5-25 MG tablet TAKE 1 TABLET BY MOUTH DAILY 90 tablet 1  . TURMERIC PO Take 1 tablet by mouth 2 (two) times daily.     . vitamin E 400 UNIT capsule Take 400 Units by mouth daily.     No current facility-administered medications on file prior to visit.    Past Medical History:  Diagnosis Date  . ALLERGIC RHINITIS   . Allergy   . Anemia   . ANXIETY   . Arthritis    hands  . Bronchitis   . Chronic fatigue fibromyalgia syndrome   . Complication of anesthesia    says one time waking up she couldn't breathe, felt like her throat closing up  . DEPRESSION   . Enthesopathy of hip region   . Family history of anesthesia complication    sister with n/v  . FOOT PAIN,  BILATERAL   . GERD   . Hiatal hernia   . HYPERLIPIDEMIA   . Hypothyroidism   . IBS   . MIGRAINE HEADACHE   . MIGRAINE, COMMON   . NECK MASS   . OSTEOPENIA   . OSTEOPOROSIS   . PLANTAR FASCIITIS   . RASH-NONVESICULAR   . SINUSITIS- ACUTE-NOS   . SKIN LESION   . VAGINITIS   . VENOUS INSUFFICIENCY, CHRONIC     Past Surgical History:  Procedure Laterality Date  . APPENDECTOMY    . BREAST ENHANCEMENT SURGERY    . BREAST IMPLANT REMOVAL    .  CHOLECYSTECTOMY    . COLONOSCOPY    . FOOT SURGERY  11/06/2016  . MASTECTOMY     bilateral for severe bilateral fibrocystic disease  . OOPHORECTOMY    . s/p neck lump removal    . SHOULDER SURGERY    . SINUS ENDO W/FUSION Right 06/30/2013   Procedure: RIGHT ENDOSCOPIC SPHENOIDECTOMY WITH FUSION SCAN;  Surgeon: [name], MD;  Location: [facility];  Service: ENT;  Laterality: Right;  . TONSILLECTOMY    . VAGINAL HYSTERECTOMY      Social History   Socioeconomic History  . Marital status: Widowed    Spouse name: Not on file  . Number of children: 2  . Years of education: Not on file  . Highest education level: Not on file  Occupational History  . Occupation: retired Press photographer  Tobacco Use  . Smoking status: Never Smoker  . Smokeless tobacco: Never Used  Substance and Sexual Activity  . Alcohol use: No  . Drug use: No  . Sexual activity: Not Currently  Other Topics Concern  . Not on file  Social History Narrative   Has 2 biological children and 1 step child   Social Determinants of Health   Financial Resource Strain:   . Difficulty of Paying Living Expenses:   Food Insecurity:   . Worried About Charity fundraiser in the Last Year:   . Arboriculturist in the Last Year:   Transportation Needs:   . Film/video editor (Medical):   Marland Kitchen Lack of Transportation (Non-Medical):   Physical Activity:   . Days of Exercise per Week:   . Minutes of Exercise per Session:   Stress:   . Feeling of Stress :   Social Connections:    . Frequency of Communication with Friends and Family:   . Frequency of Social Gatherings with Friends and Family:   . Attends Religious Services:   . Active Member of Clubs or Organizations:   . Attends Archivist Meetings:   Marland Kitchen Marital Status:     Family History  Problem Relation Age of Onset  . Diabetes Mother   . Heart disease Father   . Diabetes Father   . Diabetes Sister   . Breast cancer Maternal Grandmother   . Heart disease Maternal Uncle        x 2  . Heart disease Maternal Aunt   . Kidney disease Paternal Uncle        questionable  . Heart disease Son   . Healthy Son   . Irritable bowel syndrome Son   . Colon cancer Neg Hx   . Colon polyps Neg Hx   . Rectal cancer Neg Hx   . Stomach cancer Neg Hx     Review of Systems  Constitutional: Negative for fever.  HENT: Positive for trouble swallowing (pills, and more recently water - only once in a while).   Eyes: Positive for visual disturbance.  Respiratory: Positive for cough (chronic) and shortness of breath (slightly). Negative for wheezing.   Cardiovascular: Positive for leg swelling. Negative for chest pain and palpitations.  Gastrointestinal: Positive for constipation. Negative for abdominal pain, blood in stool and diarrhea.  Genitourinary: Negative for dysuria.  Musculoskeletal: Positive for back pain and myalgias.  Neurological: Positive for dizziness (intermittent). Negative for headaches.  Psychiatric/Behavioral: The patient is nervous/anxious.        Objective:   Vitals:   09/27/19 1503  BP: 140/80  Pulse: (!) 113  Resp: 16  Temp: (!) 97.2 F (36.2 C)  SpO2: 97%   Filed Weights   09/27/19 1503  Weight: 122 lb (55.3 kg)   Body mass index is 24.23 kg/m.  BP Readings from Last 3 Encounters:  09/27/19 140/80  09/27/19 140/80  09/18/19 (!) 164/86    Wt Readings from Last 3 Encounters:  09/27/19 122 lb (55.3 kg)  09/27/19 122 lb (55.3 kg)  09/13/19 129 lb 12.8 oz (58.9 kg)      Physical Exam Constitutional: She appears well-developed and well-nourished. No distress, but in obvious pain.  HENT:  Head: Normocephalic and atraumatic.  Right Ear: External ear normal. Normal ear canal and TM Left Ear: External ear normal.  Normal ear canal and TM Mouth/Throat: Oropharynx is clear and moist.  Eyes: Conjunctivae and EOM are normal.  Neck: Neck supple. No tracheal deviation present. No thyromegaly present.  No carotid bruit  Cardiovascular: Normal rate, regular rhythm and normal heart sounds.   No murmur heard.  Minimal b/l ankleedema. Pulmonary/Chest: Effort normal and breath sounds normal. No respiratory distress. She has no wheezes. She has no rales.  Breast: deferred   Abdominal: Soft. She exhibits no distension. There is no tenderness.  Lymphadenopathy: She has no cervical adenopathy.  Skin: Skin is warm and dry. She is not diaphoretic.  Psychiatric: She has an anxious mood and affect. Her behavior is normal.        Assessment & Plan:   Physical exam: Screening blood work    ordered Immunizations  Up to date Colonoscopy  No longer needed due to age 24  No longer needed due to age 82  n/a Dexa  Up to date  Eye exams  Up to date - surgery for floaters scheduled Exercise  none Weight  BMI Nomral Substance abuse  none  See Problem List for Assessment and Plan of chronic medical problems.    This visit occurred during the SARS-CoV-2 public health emergency.  Safety protocols were in place, including screening questions prior to the visit, additional usage of staff PPE, and extensive cleaning of exam room while observing appropriate contact time as indicated for disinfecting solutions.

## 2019-09-26 NOTE — Patient Instructions (Addendum)
Blood work was ordered.    All other Health Maintenance issues reviewed.   All recommended immunizations and age-appropriate screenings are up-to-date or discussed.  No immunization administered today.   Medications reviewed and updated.  Changes include :  none      Please followup in 6 months, sooner depending on medication    Health Maintenance, Female Adopting a healthy lifestyle and getting preventive care are important in promoting health and wellness. Ask your health care provider about:  The right schedule for you to have regular tests and exams.  Things you can do on your own to prevent diseases and keep yourself healthy. What should I know about diet, weight, and exercise? Eat a healthy diet   Eat a diet that includes plenty of vegetables, fruits, low-fat dairy products, and lean protein.  Do not eat a lot of foods that are high in solid fats, added sugars, or sodium. Maintain a healthy weight Body mass index (BMI) is used to identify weight problems. It estimates body fat based on height and weight. Your health care provider can help determine your BMI and help you achieve or maintain a healthy weight. Get regular exercise Get regular exercise. This is one of the most important things you can do for your health. Most adults should:  Exercise for at least 150 minutes each week. The exercise should increase your heart rate and make you sweat (moderate-intensity exercise).  Do strengthening exercises at least twice a week. This is in addition to the moderate-intensity exercise.  Spend less time sitting. Even light physical activity can be beneficial. Watch cholesterol and blood lipids Have your blood tested for lipids and cholesterol at 79 years of age, then have this test every 5 years. Have your cholesterol levels checked more often if:  Your lipid or cholesterol levels are high.  You are older than 79 years of age.  You are at high risk for heart  disease. What should I know about cancer screening? Depending on your health history and family history, you may need to have cancer screening at various ages. This may include screening for:  Breast cancer.  Cervical cancer.  Colorectal cancer.  Skin cancer.  Lung cancer. What should I know about heart disease, diabetes, and high blood pressure? Blood pressure and heart disease  High blood pressure causes heart disease and increases the risk of stroke. This is more likely to develop in people who have high blood pressure readings, are of African descent, or are overweight.  Have your blood pressure checked: ? Every 3-5 years if you are 65-54 years of age. ? Every year if you are 46 years old or older. Diabetes Have regular diabetes screenings. This checks your fasting blood sugar level. Have the screening done:  Once every three years after age 77 if you are at a normal weight and have a low risk for diabetes.  More often and at a younger age if you are overweight or have a high risk for diabetes. What should I know about preventing infection? Hepatitis B If you have a higher risk for hepatitis B, you should be screened for this virus. Talk with your health care provider to find out if you are at risk for hepatitis B infection. Hepatitis C Testing is recommended for:  Everyone born from [birth year] through 1965.  Anyone with known risk factors for hepatitis C. Sexually transmitted infections (STIs)  Get screened for STIs, including gonorrhea and chlamydia, if: ? You are sexually active and are  younger than 79 years of age. ? You are older than 79 years of age and your health care provider tells you that you are at risk for this type of infection. ? Your sexual activity has changed since you were last screened, and you are at increased risk for chlamydia or gonorrhea. Ask your health care provider if you are at risk.  Ask your health care provider about whether you are at high  risk for HIV. Your health care provider may recommend a prescription medicine to help prevent HIV infection. If you choose to take medicine to prevent HIV, you should first get tested for HIV. You should then be tested every 3 months for as long as you are taking the medicine. Pregnancy  If you are about to stop having your period (premenopausal) and you may become pregnant, seek counseling before you get pregnant.  Take 400 to 800 micrograms (mcg) of folic acid every day if you become pregnant.  Ask for birth control (contraception) if you want to prevent pregnancy. Osteoporosis and menopause Osteoporosis is a disease in which the bones lose minerals and strength with aging. This can result in bone fractures. If you are 44 years old or older, or if you are at risk for osteoporosis and fractures, ask your health care provider if you should:  Be screened for bone loss.  Take a calcium or vitamin D supplement to lower your risk of fractures.  Be given hormone replacement therapy (HRT) to treat symptoms of menopause. Follow these instructions at home: Lifestyle  Do not use any products that contain nicotine or tobacco, such as cigarettes, e-cigarettes, and chewing tobacco. If you need help quitting, ask your health care provider.  Do not use street drugs.  Do not share needles.  Ask your health care provider for help if you need support or information about quitting drugs. Alcohol use  Do not drink alcohol if: ? Your health care provider tells you not to drink. ? You are pregnant, may be pregnant, or are planning to become pregnant.  If you drink alcohol: ? Limit how much you use to 0-1 drink a day. ? Limit intake if you are breastfeeding.  Be aware of how much alcohol is in your drink. In the [place] one drink equals one 12 oz bottle of beer (355 mL), one 5 oz glass of wine (148 mL), or one 1 oz glass of hard liquor (44 mL). General instructions  Schedule regular health, dental,  and eye exams.  Stay current with your vaccines.  Tell your health care provider if: ? You often feel depressed. ? You have ever been abused or do not feel safe at home. Summary  Adopting a healthy lifestyle and getting preventive care are important in promoting health and wellness.  Follow your health care provider's instructions about healthy diet, exercising, and getting tested or screened for diseases.  Follow your health care provider's instructions on monitoring your cholesterol and blood pressure. This information is not intended to replace advice given to you by your health care provider. Make sure you discuss any questions you have with your health care provider. Document Revised: 05/11/2018 Document Reviewed: 05/11/2018 Elsevier Patient Education  2020 [employer].

## 2019-09-27 ENCOUNTER — Ambulatory Visit (INDEPENDENT_AMBULATORY_CARE_PROVIDER_SITE_OTHER): Payer: Medicare Other | Admitting: Internal Medicine

## 2019-09-27 ENCOUNTER — Encounter: Payer: Self-pay | Admitting: Internal Medicine

## 2019-09-27 ENCOUNTER — Ambulatory Visit (INDEPENDENT_AMBULATORY_CARE_PROVIDER_SITE_OTHER): Payer: Medicare Other

## 2019-09-27 ENCOUNTER — Other Ambulatory Visit: Payer: Self-pay

## 2019-09-27 VITALS — BP 140/80 | HR 111 | Temp 97.2°F | Resp 16 | Ht 59.75 in | Wt 122.0 lb

## 2019-09-27 VITALS — BP 140/80 | HR 113 | Temp 97.2°F | Resp 16 | Ht 59.5 in | Wt 122.0 lb

## 2019-09-27 DIAGNOSIS — E039 Hypothyroidism, unspecified: Secondary | ICD-10-CM | POA: Diagnosis not present

## 2019-09-27 DIAGNOSIS — Z79890 Hormone replacement therapy (postmenopausal): Secondary | ICD-10-CM

## 2019-09-27 DIAGNOSIS — M5416 Radiculopathy, lumbar region: Secondary | ICD-10-CM | POA: Diagnosis not present

## 2019-09-27 DIAGNOSIS — Z0000 Encounter for general adult medical examination without abnormal findings: Secondary | ICD-10-CM

## 2019-09-27 DIAGNOSIS — F419 Anxiety disorder, unspecified: Secondary | ICD-10-CM | POA: Diagnosis not present

## 2019-09-27 DIAGNOSIS — F3289 Other specified depressive episodes: Secondary | ICD-10-CM

## 2019-09-27 DIAGNOSIS — R1310 Dysphagia, unspecified: Secondary | ICD-10-CM

## 2019-09-27 DIAGNOSIS — M797 Fibromyalgia: Secondary | ICD-10-CM

## 2019-09-27 DIAGNOSIS — M7989 Other specified soft tissue disorders: Secondary | ICD-10-CM

## 2019-09-27 NOTE — Assessment & Plan Note (Signed)
Chronic Fairly controlled - current pain is causing some increased depression and anxiety Continue current dose of medication

## 2019-09-27 NOTE — Assessment & Plan Note (Signed)
Chronic  Clinically euthyroid Check tsh  Titrate med dose if needed  

## 2019-09-27 NOTE — Assessment & Plan Note (Signed)
Acute on chronic Having occ dysphagia with pills, recently with water - likely related to position in bed when she tried drinking water She will monitor  She will see GI is persists, worsens

## 2019-09-27 NOTE — Assessment & Plan Note (Signed)
Chronic Not controlled Taking xanax which is effective continue

## 2019-09-27 NOTE — Assessment & Plan Note (Signed)
Chronic Minimal on exam On dyazide

## 2019-09-27 NOTE — Assessment & Plan Note (Signed)
Chronic Estrace benefits outweigh risks Will continue

## 2019-09-27 NOTE — Patient Instructions (Signed)
Laura Barnes , Thank you for taking time to come for your Medicare Wellness Visit. I appreciate your ongoing commitment to your health goals. Please review the following plan we discussed and let me know if I can assist you in the future.   Screening recommendations/referrals: Colorectal Screening: 11/16/2011 Mammogram: will schedule Bone Density: 03/15/2018  Vision and Dental Exams: Recommended annual ophthalmology exams for early detection of glaucoma and other disorders of the eye Recommended annual dental exams for proper oral hygiene  Vaccinations: Influenza vaccine: 01/18/2019 Pneumococcal vaccine: completed; Prevnar 05/02/2013, Pneumovax 01/25/2006 Tdap vaccine: 03/24/2019 Shingles vaccine: Please call your insurance company to determine your out of pocket expense for the Shingrix vaccine. You may receive this vaccine at your local pharmacy. Zoster vaccine: 08/18/2017, 12/06/2017  Advanced directives: Advance directives discussed with you today. Please bring a copy of your POA (Power of [place]) and/or Living Will to your next appointment.  Goals:  Recommend to drink at least 6-8 8oz glasses of water per day.  Recommend to exercise for at least 150 minutes per week.  Recommend to remove any items from the home that may cause slips or trips.  Recommend to decrease portion sizes by eating 3 small healthy meals and at least 2 healthy snacks per day.  Recommend to begin DASH diet as directed below  Recommend to continue efforts to reduce smoking habits until no longer smoking. Smoking Cessation literature is attached below.  Next appointment: Please schedule your Annual Wellness Visit with your Nurse Health Advisor in one year.  Preventive Care 35 Years and Older, Female Preventive care refers to lifestyle choices and visits with your health care provider that can promote health and wellness. What does preventive care include?  A yearly physical exam. This is also called an  annual well check.  Dental exams once or twice a year.  Routine eye exams. Ask your health care provider how often you should have your eyes checked.  Personal lifestyle choices, including:  Daily care of your teeth and gums.  Regular physical activity.  Eating a healthy diet.  Avoiding tobacco and drug use.  Limiting alcohol use.  Practicing safe sex.  Taking low-dose aspirin every day if recommended by your health care provider.  Taking vitamin and mineral supplements as recommended by your health care provider. What happens during an annual well check? The services and screenings done by your health care provider during your annual well check will depend on your age, overall health, lifestyle risk factors, and family history of disease. Counseling  Your health care provider may ask you questions about your:  Alcohol use.  Tobacco use.  Drug use.  Emotional well-being.  Home and relationship well-being.  Sexual activity.  Eating habits.  History of falls.  Memory and ability to understand (cognition).  Work and work Statistician.  Reproductive health. Screening  You may have the following tests or measurements:  Height, weight, and BMI.  Blood pressure.  Lipid and cholesterol levels. These may be checked every 5 years, or more frequently if you are over 18 years old.  Skin check.  Lung cancer screening. You may have this screening every year starting at age 34 if you have a 30-pack-year history of smoking and currently smoke or have quit within the past 15 years.  Fecal occult blood test (FOBT) of the stool. You may have this test every year starting at age 3.  Flexible sigmoidoscopy or colonoscopy. You may have a sigmoidoscopy every 5 years or a colonoscopy every  10 years starting at age 63.  Hepatitis C blood test.  Hepatitis B blood test.  Sexually transmitted disease (STD) testing.  Diabetes screening. This is done by checking your blood  sugar (glucose) after you have not eaten for a while (fasting). You may have this done every 1-3 years.  Bone density scan. This is done to screen for osteoporosis. You may have this done starting at age 41.  Mammogram. This may be done every 1-2 years. Talk to your health care provider about how often you should have regular mammograms. Talk with your health care provider about your test results, treatment options, and if necessary, the need for more tests. Vaccines  Your health care provider may recommend certain vaccines, such as:  Influenza vaccine. This is recommended every year.  Tetanus, diphtheria, and acellular pertussis (Tdap, Td) vaccine. You may need a Td booster every 10 years.  Zoster vaccine. You may need this after age 40.  Pneumococcal 13-valent conjugate (PCV13) vaccine. One dose is recommended after age 46.  Pneumococcal polysaccharide (PPSV23) vaccine. One dose is recommended after age 81. Talk to your health care provider about which screenings and vaccines you need and how often you need them. This information is not intended to replace advice given to you by your health care provider. Make sure you discuss any questions you have with your health care provider. Document Released: 06/14/2015 Document Revised: 02/05/2016 Document Reviewed: 03/19/2015 Elsevier Interactive Patient Education  2017 [place] Prevention in the Home Falls can cause injuries. They can happen to people of all ages. There are many things you can do to make your home safe and to help prevent falls. What can I do on the outside of my home?  Regularly fix the edges of walkways and driveways and fix any cracks.  Remove anything that might make you trip as you walk through a door, such as a raised step or threshold.  Trim any bushes or trees on the path to your home.  Use bright outdoor lighting.  Clear any walking paths of anything that might make someone trip, such as rocks or  tools.  Regularly check to see if handrails are loose or broken. Make sure that both sides of any steps have handrails.  Any raised decks and porches should have guardrails on the edges.  Have any leaves, snow, or ice cleared regularly.  Use sand or salt on walking paths during winter.  Clean up any spills in your garage right away. This includes oil or grease spills. What can I do in the bathroom?  Use night lights.  Install grab bars by the toilet and in the tub and shower. Do not use towel bars as grab bars.  Use non-skid mats or decals in the tub or shower.  If you need to sit down in the shower, use a plastic, non-slip stool.  Keep the floor dry. Clean up any water that spills on the floor as soon as it happens.  Remove soap buildup in the tub or shower regularly.  Attach bath mats securely with double-sided non-slip rug tape.  Do not have throw rugs and other things on the floor that can make you trip. What can I do in the bedroom?  Use night lights.  Make sure that you have a light by your bed that is easy to reach.  Do not use any sheets or blankets that are too big for your bed. They should not hang down onto the floor.  Have a firm chair that has side arms. You can use this for support while you get dressed.  Do not have throw rugs and other things on the floor that can make you trip. What can I do in the kitchen?  Clean up any spills right away.  Avoid walking on wet floors.  Keep items that you use a lot in easy-to-reach places.  If you need to reach something above you, use a strong step stool that has a grab bar.  Keep electrical cords out of the way.  Do not use floor polish or wax that makes floors slippery. If you must use wax, use non-skid floor wax.  Do not have throw rugs and other things on the floor that can make you trip. What can I do with my stairs?  Do not leave any items on the stairs.  Make sure that there are handrails on both  sides of the stairs and use them. Fix handrails that are broken or loose. Make sure that handrails are as long as the stairways.  Check any carpeting to make sure that it is firmly attached to the stairs. Fix any carpet that is loose or worn.  Avoid having throw rugs at the top or bottom of the stairs. If you do have throw rugs, attach them to the floor with carpet tape.  Make sure that you have a light switch at the top of the stairs and the bottom of the stairs. If you do not have them, ask someone to add them for you. What else can I do to help prevent falls?  Wear shoes that:  Do not have high heels.  Have rubber bottoms.  Are comfortable and fit you well.  Are closed at the toe. Do not wear sandals.  If you use a stepladder:  Make sure that it is fully opened. Do not climb a closed stepladder.  Make sure that both sides of the stepladder are locked into place.  Ask someone to hold it for you, if possible.  Clearly mark and make sure that you can see:  Any grab bars or handrails.  First and last steps.  Where the edge of each step is.  Use tools that help you move around (mobility aids) if they are needed. These include:  Canes.  Walkers.  Scooters.  Crutches.  Turn on the lights when you go into a dark area. Replace any light bulbs as soon as they burn out.  Set up your furniture so you have a clear path. Avoid moving your furniture around.  If any of your floors are uneven, fix them.  If there are any pets around you, be aware of where they are.  Review your medicines with your doctor. Some medicines can make you feel dizzy. This can increase your chance of falling. Ask your doctor what other things that you can do to help prevent falls. This information is not intended to replace advice given to you by your health care provider. Make sure you discuss any questions you have with your health care provider. Document Released: 03/14/2009 Document Revised:  10/24/2015 Document Reviewed: 06/22/2014 Elsevier Interactive Patient Education  2017 [employer].

## 2019-09-27 NOTE — Assessment & Plan Note (Signed)
Chronic Continue supplements Continue wellbutrin, estradiol

## 2019-09-27 NOTE — Progress Notes (Signed)
Subjective:   Laura Barnes is a 79 y.o. female who presents for Medicare Annual (Subsequent) preventive examination.  Review of Systems:  No ROS Medicare Wellness Visit Cardiac Risk Factors include: advanced age (>44men, >60 women);family history of premature cardiovascular disease Sleep Patterns: No issues with falling sleep; uses a humidifier at night. Home Safety/Smoke Alarms: Feels safe in home; Smoke alarms in place. Living environment: 1-story home; lives alone with two dogs. Seat Belt Safety/Bike Helmet: Wears seat belt.    Objective:     Vitals: BP 140/80 (BP Location: Left Arm, Patient Position: Sitting, Cuff Size: Normal)   Pulse (!) 111   Temp (!) 97.2 F (36.2 C)   Resp 16   Ht 4' 11.75" (1.518 m)   Wt 122 lb (55.3 kg)   SpO2 96%   BMI 24.03 kg/m   Body mass index is 24.03 kg/m.  Advanced Directives 09/27/2019 09/15/2019 09/22/2018 09/21/2017 04/23/2017 08/07/2016 09/16/2015  Does Patient Have a Medical Advance Directive? Yes No Yes Yes Yes No Yes  Type of Paramedic of [place];Living will - [place];Living will [place];Living will [place];Living will - [place];Living will  Does patient want to make changes to medical advance directive? No - Patient declined - Yes (ED - Information included in AVS) - - - -  Copy of [place] in Chart? No - copy requested - No - copy requested No - copy requested - - -  Would patient like information on creating a medical advance directive? - - - - - No - Patient declined -    Tobacco Social History   Tobacco Use  Smoking Status Never Smoker  Smokeless Tobacco Never Used     Counseling given: No   Clinical Intake:  Pre-visit preparation completed: Yes  Pain : No/denies pain Pain Score: 0-No pain Faces Pain Scale: Hurts worst Pain Type: Chronic pain Pain Location: Leg Pain Orientation:  Left  Faces Pain Scale: Hurts worst  Diabetes: No  How often do you need to have someone help you when you read instructions, pamphlets, or other written materials from your doctor or pharmacy?: 1 - Never What is the last grade level you completed in school?: [employer]ge  Interpreter Needed?: No  Information entered by :: Theresa Wedel N. Lowell Guitar, LPN  Past Medical History:  Diagnosis Date  . ALLERGIC RHINITIS   . Allergy   . Anemia   . ANXIETY   . Arthritis    hands  . Bronchitis   . Chronic fatigue fibromyalgia syndrome   . Complication of anesthesia    says one time waking up she couldn't breathe, felt like her throat closing up  . DEPRESSION   . Enthesopathy of hip region   . Family history of anesthesia complication    sister with n/v  . FOOT PAIN, BILATERAL   . GERD   . Hiatal hernia   . HYPERLIPIDEMIA   . Hypothyroidism   . IBS   . MIGRAINE HEADACHE   . MIGRAINE, COMMON   . NECK MASS   . OSTEOPENIA   . OSTEOPOROSIS   . PLANTAR FASCIITIS   . RASH-NONVESICULAR   . SINUSITIS- ACUTE-NOS   . SKIN LESION   . VAGINITIS   . VENOUS INSUFFICIENCY, CHRONIC    Past Surgical History:  Procedure Laterality Date  . APPENDECTOMY    . BREAST ENHANCEMENT SURGERY    . BREAST IMPLANT REMOVAL    .  CHOLECYSTECTOMY    . COLONOSCOPY    . FOOT SURGERY  11/06/2016  . MASTECTOMY     bilateral for severe bilateral fibrocystic disease  . OOPHORECTOMY    . s/p neck lump removal    . SHOULDER SURGERY    . SINUS ENDO W/FUSION Right 06/30/2013   Procedure: RIGHT ENDOSCOPIC SPHENOIDECTOMY WITH FUSION SCAN;  Surgeon: [name], MD;  Location: [facility];  Service: ENT;  Laterality: Right;  . TONSILLECTOMY    . VAGINAL HYSTERECTOMY     Family History  Problem Relation Age of Onset  . Diabetes Mother   . Heart disease Father   . Diabetes Father   . Diabetes Sister   . Breast cancer Maternal Grandmother   . Heart disease Maternal Uncle        x 2  . Heart disease Maternal Aunt   .  Kidney disease Paternal Uncle        questionable  . Heart disease Son   . Healthy Son   . Irritable bowel syndrome Son   . Colon cancer Neg Hx   . Colon polyps Neg Hx   . Rectal cancer Neg Hx   . Stomach cancer Neg Hx    Social History   Socioeconomic History  . Marital status: Widowed    Spouse name: Not on file  . Number of children: 2  . Years of education: Not on file  . Highest education level: Not on file  Occupational History  . Occupation: retired Press photographer  Tobacco Use  . Smoking status: Never Smoker  . Smokeless tobacco: Never Used  Substance and Sexual Activity  . Alcohol use: No  . Drug use: No  . Sexual activity: Not Currently  Other Topics Concern  . Not on file  Social History Narrative   Has 2 biological children and 1 step child   Social Determinants of Health   Financial Resource Strain:   . Difficulty of Paying Living Expenses:   Food Insecurity:   . Worried About Charity fundraiser in the Last Year:   . Arboriculturist in the Last Year:   Transportation Needs:   . Film/video editor (Medical):   Marland Kitchen Lack of Transportation (Non-Medical):   Physical Activity:   . Days of Exercise per Week:   . Minutes of Exercise per Session:   Stress:   . Feeling of Stress :   Social Connections:   . Frequency of Communication with Friends and Family:   . Frequency of Social Gatherings with Friends and Family:   . Attends Religious Services:   . Active Member of Clubs or Organizations:   . Attends Archivist Meetings:   Marland Kitchen Marital Status:     Outpatient Encounter Medications as of 09/27/2019  Medication Sig  . acetaminophen (TYLENOL) 500 MG tablet Take 1,000 mg by mouth every 6 (six) hours as needed for mild pain, moderate pain, fever or headache.  . ALPRAZolam (XANAX) 0.5 MG tablet TAKE 1 TABLET(0.5 MG) BY MOUTH THREE TIMES DAILY AS NEEDED FOR ANXIETY  . Amino Acids (AMINO ACID PO) Take 1 capsule by mouth 2 (two) times daily.  . Ascorbic  Acid (VITAMIN C) 1000 MG tablet Take 1,000 mg by mouth 2 (two) times daily.  . B Complex-Biotin-FA (B COMPLETE) TABS Take 1 tablet by mouth daily. B Complete 100mg   . benzonatate (TESSALON) 200 MG capsule Take 1 capsule (200 mg total) by mouth 3 (three) times daily as needed for cough.  Marland Kitchen  buPROPion (WELLBUTRIN XL) 300 MG 24 hr tablet TAKE 1 TABLET(300 MG) BY MOUTH DAILY  . Cholecalciferol (VITAMIN D3) 1000 units CAPS Take 1,000 Units by mouth daily.  . Coenzyme Q10 (CO Q-10) 200 MG CAPS Take 1 capsule by mouth 2 (two) times daily.  Marland Kitchen ECHINACEA PO Take 1 tablet by mouth 2 (two) times daily.  Marland Kitchen ELDERBERRY PO Take by mouth daily.  Marland Kitchen estradiol (ESTRACE) 0.5 MG tablet Take 1 tablet (0.5 mg total) by mouth daily.  Marland Kitchen gabapentin (NEURONTIN) 300 MG capsule Take 1 capsule (300 mg total) by mouth 3 (three) times daily. (Patient not taking: Reported on 09/27/2019)  . GOLDEN SEAL PO Take by mouth daily.  Nyoka Cowden Tea, Camellia sinensis, (EGCG) POWD Take 1 capsule by mouth daily.  . Homeopathic Products (ARNICA [place]) PLLT Take by mouth daily.  Marland Kitchen HYDROmorphone (DILAUDID) 4 MG tablet Take 1 tablet (4 mg total) by mouth every 4 (four) hours as needed for severe pain (chronic back pain).  Marland Kitchen KRILL OIL PO Take by mouth daily.  Marland Kitchen LECITHIN PO Take 1 tablet by mouth daily.  Marland Kitchen levothyroxine (SYNTHROID) 75 MCG tablet TAKE 1 TABLET BY MOUTH EVERY DAY IN THE MORNING  . MAGNESIUM ASPARTATE PO Take 1 tablet by mouth 2 (two) times daily.  . meclizine (ANTIVERT) 25 MG tablet Take 1 tablet (25 mg total) by mouth 3 (three) times daily as needed for dizziness.  . Misc. Devices (ROLLATOR) MISC Use rollator for ambulation  . Multiple Vitamins-Minerals (ZINC PO) Take by mouth daily.  . ondansetron (ZOFRAN) 4 MG tablet TAKE 1 TABLET(4 MG) BY MOUTH EVERY 8 HOURS AS NEEDED FOR NAUSEA OR VOMITING  . ondansetron (ZOFRAN-ODT) 8 MG disintegrating tablet Take 1 tablet (8 mg total) by mouth every 8 (eight) hours as needed for nausea or  vomiting.  Marland Kitchen POTASSIUM AMINOBENZOATE PO Take 1 tablet by mouth daily.  . prochlorperazine (COMPAZINE) 10 MG tablet Take 1 tablet (10 mg total) by mouth every 6 (six) hours as needed for nausea or vomiting.  . triamterene-hydrochlorothiazide (MAXZIDE-25) 37.5-25 MG tablet TAKE 1 TABLET BY MOUTH DAILY  . TURMERIC PO Take 1 tablet by mouth 2 (two) times daily.   . vitamin E 400 UNIT capsule Take 400 Units by mouth daily.   No facility-administered encounter medications on file as of 09/27/2019.    Activities of Daily Living In your present state of health, do you have any difficulty performing the following activities: 09/27/2019  Hearing? N  Vision? Y  Comment will be having floater surgery  Difficulty concentrating or making decisions? N  Walking or climbing stairs? Y  Dressing or bathing? N  Doing errands, shopping? N  Preparing Food and eating ? N  Using the Toilet? N  In the past six months, have you accidently leaked urine? N  Do you have problems with loss of bowel control? N  Managing your Medications? N  Managing your Finances? N  Housekeeping or managing your Housekeeping? N  Some recent data might be hidden    Patient Care Team: Binnie Rail, MD as PCP - General (Internal Medicine) Bo Merino, MD as Consulting Physician (Rheumatology) Dorothy Spark, MD as Consulting Physician (Cardiology) [name], MD as Consulting Physician (Otolaryngology) Lyndal Pulley, DO as Consulting Physician (Family Medicine) Brand Males, MD as Consulting Physician (Pulmonary Disease)    Assessment:   This is a routine wellness examination for Kimori.  Exercise Activities and Dietary recommendations Current Exercise Habits: The patient does not participate  in regular exercise at present, Exercise limited by: neurologic condition(s);orthopedic condition(s)  Goals    . Client understands the importance of follow-up with providers by attending scheduled visits     . Patient Stated     Continue to stay engaged socially, be active within my church. Enjoy working in my flower beds and remodel some rooms in my house. Love family and [place].    . Patient Stated     Continue to stay socially active in church, with my family, and friends. Eat healthy and be as active as possible.       Fall Risk Fall Risk  09/27/2019 09/18/2019 09/22/2018 09/21/2017 04/30/2017  Falls in the past year? 1 1 0 No Yes  Number falls in past yr: 1 1 0 - -  Injury with Fall? 1 1 - - -  Risk for fall due to : Impaired balance/gait;Impaired mobility;History of fall(s) - Impaired balance/gait - (No Data)  Risk for fall due to: Comment - - - - VErtigo  Follow up Falls evaluation completed;Education provided;Falls prevention discussed Falls evaluation completed Falls prevention discussed - -   Is the patient's home free of loose throw rugs in walkways, pet beds, electrical cords, etc?   yes      Grab bars in the bathroom? yes      Handrails on the stairs?   yes      Adequate lighting?   yes  Depression Screen PHQ 2/9 Scores 09/27/2019 01/03/2019 09/22/2018 03/15/2018  PHQ - 2 Score 0 3 0 0  PHQ- 9 Score - 9 4 0     Cognitive Function MMSE - Mini Mental State Exam 09/21/2017  Orientation to time 5  Orientation to Place 5  Registration 3  Attention/ Calculation 5  Recall 1  Language- name 2 objects 2  Language- repeat 1  Language- follow 3 step command 3  Language- read & follow direction 1  Write a sentence 1  Copy design 1  Total score 28        Immunization History  Administered Date(s) Administered  . Fluad Quad(high Dose 65+) 01/18/2019  . Influenza Split 04/07/2011, 03/08/2012  . Influenza Whole 03/20/2008, 04/01/2009, 04/02/2010  . Influenza, High Dose Seasonal PF 04/12/2013, 03/26/2015, 03/04/2016, 04/01/2017  . Influenza,inj,Quad PF,6+ Mos 05/01/2014  . Influenza-Unspecified 04/01/2018  . Pneumococcal Conjugate-13 05/02/2013  . Pneumococcal  Polysaccharide-23 01/25/2006  . Td 09/17/2008  . Tdap 03/24/2019  . Zoster 01/25/2006  . Zoster Recombinat (Shingrix) 08/18/2017, 12/06/2017    Qualifies for Shingles Vaccine? declined  Screening Tests Health Maintenance  Topic Date Due  . COVID-19 Vaccine (1) 10/13/2019 (Originally 12/29/1956)  . INFLUENZA VACCINE  12/31/2019  . DEXA SCAN  03/15/2020  . TETANUS/TDAP  03/23/2029  . PNA vac Low Risk Adult  Completed    Cancer Screenings: Lung: Low Dose CT Chest recommended if Age 34-80 years, 30 pack-year currently smoking OR have quit w/in 15years. Patient does not qualify. Breast:  Up to date on Mammogram? No   Up to date of Bone Density/Dexa? No Colorectal: Yes     Plan:     Reviewed health maintenance screenings with patient today and relevant education, vaccines, and/or referrals were provided.    Continue doing brain stimulating activities (puzzles, reading, adult coloring books, staying active) to keep memory sharp.    Continue to eat heart healthy diet (full of fruits, vegetables, whole grains, lean protein, water--limit salt, fat, and sugar intake) and increase physical activity as tolerated.  I have  personally reviewed and noted the following in the patient's chart:   . Medical and social history . Use of alcohol, tobacco or illicit drugs  . Current medications and supplements . Functional ability and status . Nutritional status . Physical activity . Advanced directives . List of other physicians . Hospitalizations, surgeries, and ER visits in previous 12 months . Vitals . Screenings to include cognitive, depression, and falls . Referrals and appointments  In addition, I have reviewed and discussed with patient certain preventive protocols, quality metrics, and best practice recommendations. A written personalized care plan for preventive services as well as general preventive health recommendations were provided to patient.     Sheral Flow,  LPN  [phone]  Nurse Health Advisor  Nurse Notes:  Referral to [facility] Team

## 2019-09-27 NOTE — Assessment & Plan Note (Signed)
Severe, acute On dilaudid -  Helps a little Has MRI on Monday Advised f/u with ortho

## 2019-09-28 ENCOUNTER — Other Ambulatory Visit: Payer: Self-pay | Admitting: Internal Medicine

## 2019-09-28 DIAGNOSIS — F419 Anxiety disorder, unspecified: Secondary | ICD-10-CM

## 2019-09-28 NOTE — Telephone Encounter (Signed)
Last RF 08/29/19

## 2019-10-02 ENCOUNTER — Ambulatory Visit (HOSPITAL_COMMUNITY)
Admission: RE | Admit: 2019-10-02 | Discharge: 2019-10-02 | Disposition: A | Discharge status: Home / Self Care | Payer: Medicare Other | Source: Ambulatory Visit | Attending: Rheumatology | Admitting: Rheumatology

## 2019-10-02 ENCOUNTER — Other Ambulatory Visit: Payer: Self-pay

## 2019-10-02 ENCOUNTER — Other Ambulatory Visit: Payer: Self-pay | Admitting: Internal Medicine

## 2019-10-02 DIAGNOSIS — M5136 Other intervertebral disc degeneration, lumbar region: Secondary | ICD-10-CM | POA: Diagnosis not present

## 2019-10-02 DIAGNOSIS — M5116 Intervertebral disc disorders with radiculopathy, lumbar region: Secondary | ICD-10-CM | POA: Diagnosis not present

## 2019-10-02 DIAGNOSIS — M48061 Spinal stenosis, lumbar region without neurogenic claudication: Secondary | ICD-10-CM | POA: Diagnosis not present

## 2019-10-02 MED ORDER — HYDROMORPHONE HCL 4 MG PO TABS: 4.0000 mg | ORAL_TABLET | ORAL | 0 refills | Status: DC | PRN

## 2019-10-02 NOTE — Telephone Encounter (Signed)
New message:    1.Medication Requested: HYDROmorphone (DILAUDID) 4 MG tablet 2. Pharmacy (Name, Street, [place]): [facility] 3. On Med List: Yes  4. Last Visit with PCP: 09/27/19  5. Next visit date with PCP: 03/29/20   Agent: Please be advised that RX refills may take up to 3 business days. We ask that you follow-up with your pharmacy.

## 2019-10-02 NOTE — Telephone Encounter (Signed)
Last RF 09/25/19

## 2019-10-03 ENCOUNTER — Encounter: Payer: Self-pay | Admitting: Internal Medicine

## 2019-10-03 NOTE — Progress Notes (Signed)
MRI findings are consistent with severe disc disease, spinal stenosis and disc extrusion.  She will need evaluation by back specialist.  Please discussed with patient.

## 2019-10-04 ENCOUNTER — Telehealth: Payer: Self-pay | Admitting: *Deleted

## 2019-10-04 ENCOUNTER — Encounter: Payer: Self-pay | Admitting: Internal Medicine

## 2019-10-04 ENCOUNTER — Telehealth: Payer: Self-pay | Admitting: Rheumatology

## 2019-10-04 DIAGNOSIS — M5136 Other intervertebral disc degeneration, lumbar region: Secondary | ICD-10-CM

## 2019-10-04 NOTE — Telephone Encounter (Signed)
Cassandra from [facility] calling to let you know Laura Barnes seemed confused as to why [name] was calling to schedule her an appointment. Cassandra asked if possible, please call to advise patient, and call Cassandra when she can call patient back to schedule an appointment. Per Cassandra, patient has seen Dr. Lorin Mercy for her back in 2019.

## 2019-10-04 NOTE — Telephone Encounter (Signed)
-----   Message from Bo Merino, MD sent at 10/03/2019 12:40 PM EDT ----- MRI findings are consistent with severe disc disease, spinal stenosis and disc extrusion.  She will need evaluation by back specialist.  Please discussed with patient.

## 2019-10-04 NOTE — Telephone Encounter (Signed)
I called patient, referral sent to Dr. Donnella Bi office for earlier appt. Patient scheduled with Dr. Louanne Skye 11/22/2019. Patient does not want to see Dr. Lorin Mercy.

## 2019-10-06 ENCOUNTER — Telehealth: Payer: Self-pay | Admitting: Rheumatology

## 2019-10-06 NOTE — Telephone Encounter (Signed)
Patient called back to find out what you found out in reference to your discussion earlier.

## 2019-10-06 NOTE — Telephone Encounter (Signed)
Patient left a message on the answering machine 1:15pm today, stating she was trying to get in touch with [name].

## 2019-10-06 NOTE — Telephone Encounter (Signed)
Referral received, referral will be scheduled 1st available, LMOM, MRI results were discussed with patient x 2.

## 2019-10-09 ENCOUNTER — Telehealth: Payer: Self-pay | Admitting: Rheumatology

## 2019-10-09 NOTE — Telephone Encounter (Signed)
Attempted to contact patient and left message on machine to advise patient to call the office.  

## 2019-10-09 NOTE — Telephone Encounter (Signed)
Patient requesting a call to go over her MRI results again. She has a different pain, and wants to discuss results along with new pain. Please call to advise.

## 2019-10-10 ENCOUNTER — Encounter: Payer: Self-pay | Admitting: Internal Medicine

## 2019-10-10 NOTE — Telephone Encounter (Signed)
Patient states she is still is quite a bit of pain. Patient asked if her about her referral to the back specialist. Patient has an appointment with Dr. Louanne Skye on November 22, 2019. Referral was also sent to Dr. Lollie Sails office and is under review. Patient advised we are trying to get her a sooner appointment. Patient states for the last week and a half she has been experiencing left groin pain and wanted to know if this may be due to her issues she is having with her back. Please advise.

## 2019-10-10 NOTE — Telephone Encounter (Signed)
I returned patient's call.  She states she is able to move her hip in and out without any problems.  I explained to the patient that the pain in her groin could be coming from her back.  She is requesting an earlier appointment with Dr. Louanne Skye.  Please call Dr. Otho Ket office to see if there are any cancellations and they can fit her in sooner.

## 2019-10-10 NOTE — Telephone Encounter (Signed)
Patient calling back in regards to message left yesterday. Patient request a call today.

## 2019-10-11 ENCOUNTER — Encounter: Payer: Self-pay | Admitting: Internal Medicine

## 2019-10-11 ENCOUNTER — Telehealth: Payer: Self-pay | Admitting: *Deleted

## 2019-10-11 MED ORDER — HYDROMORPHONE HCL 4 MG PO TABS: 4.0000 mg | ORAL_TABLET | ORAL | 0 refills | Status: DC | PRN

## 2019-10-11 NOTE — Telephone Encounter (Signed)
I called patient, patient has appt with Dr. Larose Hires on 10/16/2019. Patient was offered appt on 10/12/2019; however, she has not transportation. Patient wanted appt with Dr. Louanne Skye cancelled.

## 2019-10-11 NOTE — Telephone Encounter (Signed)
error 

## 2019-10-12 DIAGNOSIS — M5126 Other intervertebral disc displacement, lumbar region: Secondary | ICD-10-CM | POA: Diagnosis not present

## 2019-10-12 DIAGNOSIS — M4316 Spondylolisthesis, lumbar region: Secondary | ICD-10-CM | POA: Diagnosis not present

## 2019-10-13 ENCOUNTER — Telehealth: Payer: Self-pay | Admitting: Internal Medicine

## 2019-10-13 NOTE — Telephone Encounter (Signed)
  Community Resource Referral   [place] 10/13/2019  [facility]  Name: Laura Barnes   MRN: [id number]   DOB: [birth year]   AGE: 79 y.o.   GENDER: female   PCP Binnie Rail, MD.   Called pt regarding Community Resource Referral for DME and In home care needs She stated that she may be having back surgery at some point for her herniated disc. Her family all works and is not able to help assist her. She suffers from Fibromyalgia as well.    Discussed Home Health coverage through [facility] Medicare and she also asked me to send her a list of in-home care (cleaning, bathing, help around house) agencies as she will be self-pay. She stated that she does have a housekeeper that comes to clean her house and we discussed the Mom's meals benefit through [facility] that she is eligible for as well.  Her mother lived with her previously so she has elevated toilet seat, grab bars, shower chair and feels safe in her home. She stated that her church had agreed to install a wheelchair ramp for her entry into her home but was not sure when they would be available to install it. Provided number to [facility] so that she could start that intake process should she need their help she can be on the waiting list in the meantime. Will email pt with follow up items and my contact information Closing referral pending any other needs of patient. [place] . [email or web address]  [phone]

## 2019-10-13 NOTE — Telephone Encounter (Signed)
Email to pt From: Jill Alexanders [facility])  Sent: Friday, Oct 13, 2019 2:14 PM To: [email or web address] Subject: Secure: [facility]  Good Afternoon Ms. Guagliardo, Thank you for speaking with me today regarding community resources for [facility].  I have attached the information regarding in home assistance agencies.  Below is the contact information for the [employer] if you'd like to go ahead and get on their waiting list for a wheelchair ramp [email or web address] [phone]   Please let me know if you need anything further,   [place] . [email or web address]  [phone]

## 2019-10-16 ENCOUNTER — Other Ambulatory Visit: Payer: Self-pay | Admitting: Neurosurgery

## 2019-10-16 DIAGNOSIS — M5126 Other intervertebral disc displacement, lumbar region: Secondary | ICD-10-CM

## 2019-10-17 ENCOUNTER — Encounter: Payer: Self-pay | Admitting: Internal Medicine

## 2019-10-18 ENCOUNTER — Encounter: Payer: Self-pay | Admitting: Family Medicine

## 2019-10-18 ENCOUNTER — Ambulatory Visit (INDEPENDENT_AMBULATORY_CARE_PROVIDER_SITE_OTHER): Payer: Medicare Other | Admitting: Family Medicine

## 2019-10-18 ENCOUNTER — Other Ambulatory Visit: Payer: Self-pay

## 2019-10-18 VITALS — BP 122/86 | HR 111 | Ht 59.0 in | Wt 113.0 lb

## 2019-10-18 DIAGNOSIS — M5416 Radiculopathy, lumbar region: Secondary | ICD-10-CM

## 2019-10-18 DIAGNOSIS — M2550 Pain in unspecified joint: Secondary | ICD-10-CM | POA: Diagnosis not present

## 2019-10-18 DIAGNOSIS — M47816 Spondylosis without myelopathy or radiculopathy, lumbar region: Secondary | ICD-10-CM

## 2019-10-18 MED ORDER — AMBULATORY NON FORMULARY MEDICATION: 1.0000 [IU] | Freq: Once | 0 refills | Status: AC

## 2019-10-18 MED ORDER — KETOROLAC TROMETHAMINE 60 MG/2ML IM SOLN
60.0000 mg | Freq: Once | INTRAMUSCULAR | Status: AC
  Administered 2019-10-18: 60 mg via INTRAMUSCULAR

## 2019-10-18 NOTE — Patient Instructions (Signed)
You are in good hands with [facility] Continue with pain mgmnt referral I'm here if you have questions, follow up with [facility]

## 2019-10-18 NOTE — Assessment & Plan Note (Signed)
Patient is having lumbar radiculopathy.  Consistent with her L4 nerve roots.  Patient's MRI is very consistent with this that she recently got.  It appears the patient is scheduled for another 1 secondary to some mild movement artifact.  I do believe that secondary to the weakness, difficulty with ambulation that I do think she needs to follow-up with her neurosurgeons.  Toradol given today because patient does feel like it does help initially.  Discussed with her though that I do not feel with patient's allergies and chronic narcotics that there is much else we can do for her.  Patient has been referred to pain management by another family physician as well.  Encouraged her to continue to try to be active when possible.  Declined physical therapy.  I do feel that once again in follow-up with her neurosurgeon is more important.

## 2019-10-18 NOTE — Progress Notes (Signed)
[place] Phone: [phone] Subjective:   Laura Barnes, am serving as a scribe for Dr. Hulan Saas. This visit occurred during the SARS-CoV-2 public health emergency.  Safety protocols were in place, including screening questions prior to the visit, additional usage of staff PPE, and extensive cleaning of exam room while observing appropriate contact time as indicated for disinfecting solutions.   I'm seeing this patient by the request  of:  Binnie Rail, MD  CC: Low back pain with radiation  [id number]   08/16/2018 Stable at moment.  Barnes further work-up at this point.  Patient encouraged to stay away at this point as long as feeling well secondary to coronavirus follow-up again in 3 to 4 months  I believe some of the swelling is secondary to the chronic venous insufficiency and we discussed compression again.  Known arthritic changes.  Hold on any vascular or viscosupplementation.  Nothing patient will do fairly well with conservative therapy.  Discussed home exercise, icing regimen, which activities doing which wants to avoid.  Patient is to increase activity slowly over the course the next several days.  Follow-up with me again in 8 weeks.  Spent  25 minutes with patient face-to-face and had greater than 50% of counseling including as described above in assessment and plan.  Update 10/18/2019 Laura Barnes is a 79 y.o. female coming in with complaint of left knee pain. Patient states that she has been having increase in left hip all the way to ankle. Skin numb over anterior tibia. Going to have another MRI June 17th. Using hydrocodone for pain relief.  Patient states that it is the only thing that seems to help with her pain is the narcotics.   MRI 10/02/2019 . Incomplete study due to patient pain, only sagittal T1 and T2 weighted imaging was acquired. 2. Symptomatic finding presumably at L4-5 where  there is a large superiorly migrating disc extrusion severely compressing the left L4 nerve root. Anterolisthesis at this level has progressed and there is likely high-grade spinal stenosis even before the herniation. 3. Generalized degenerative disease with scoliosis and L4-5 anterolisthesis, as above.  Past Medical History:  Diagnosis Date  . ALLERGIC RHINITIS   . Allergy   . Anemia   . ANXIETY   . Arthritis    hands  . Bronchitis   . Chronic fatigue fibromyalgia syndrome   . Complication of anesthesia    says one time waking up she couldn't breathe, felt like her throat closing up  . DEPRESSION   . Enthesopathy of hip region   . Family history of anesthesia complication    sister with n/v  . FOOT PAIN, BILATERAL   . GERD   . Hiatal hernia   . HYPERLIPIDEMIA   . Hypothyroidism   . IBS   . MIGRAINE HEADACHE   . MIGRAINE, COMMON   . NECK MASS   . OSTEOPENIA   . OSTEOPOROSIS   . PLANTAR FASCIITIS   . RASH-NONVESICULAR   . SINUSITIS- ACUTE-NOS   . SKIN LESION   . VAGINITIS   . VENOUS INSUFFICIENCY, CHRONIC    Past Surgical History:  Procedure Laterality Date  . APPENDECTOMY    . BREAST ENHANCEMENT SURGERY    . BREAST IMPLANT REMOVAL    . CHOLECYSTECTOMY    . COLONOSCOPY    . FOOT SURGERY  11/06/2016  . MASTECTOMY     bilateral for severe bilateral fibrocystic disease  .  OOPHORECTOMY    . s/p neck lump removal    . SHOULDER SURGERY    . SINUS ENDO W/FUSION Right 06/30/2013   Procedure: RIGHT ENDOSCOPIC SPHENOIDECTOMY WITH FUSION SCAN;  Surgeon: [name], MD;  Location: [facility];  Service: ENT;  Laterality: Right;  . TONSILLECTOMY    . VAGINAL HYSTERECTOMY     Social History   Socioeconomic History  . Marital status: Widowed    Spouse name: Not on file  . Number of children: 2  . Years of education: Not on file  . Highest education level: Not on file  Occupational History  . Occupation: retired Press photographer  Tobacco Use  . Smoking status: Never  Smoker  . Smokeless tobacco: Never Used  Substance and Sexual Activity  . Alcohol use: Barnes  . Drug use: Barnes  . Sexual activity: Not Currently  Other Topics Concern  . Not on file  Social History Narrative   Has 2 biological children and 1 step child   Social Determinants of Health   Financial Resource Strain:   . Difficulty of Paying Living Expenses:   Food Insecurity:   . Worried About Charity fundraiser in the Last Year:   . Arboriculturist in the Last Year:   Transportation Needs:   . Film/video editor (Medical):   Marland Kitchen Lack of Transportation (Non-Medical):   Physical Activity:   . Days of Exercise per Week:   . Minutes of Exercise per Session:   Stress:   . Feeling of Stress :   Social Connections:   . Frequency of Communication with Friends and Family:   . Frequency of Social Gatherings with Friends and Family:   . Attends Religious Services:   . Active Member of Clubs or Organizations:   . Attends Archivist Meetings:   Marland Kitchen Marital Status:    Allergies  Allergen Reactions  . Prednisone Anaphylaxis    Patient unable to remember why she needed to steroid shot but just knows that when she got back to work she started having a lot of trouble breathing.  (jkl 05/04/14)  Multiple epidural steroid injections with depomedrol with Barnes issues. (05/04/18)  . Amoxicillin-Pot Clavulanate Hives and Diarrhea    Has patient had a PCN reaction causing immediate rash, facial/tongue/throat swelling, SOB or lightheadedness with hypotension: Unknown Has patient had a PCN reaction causing severe rash involving mucus membranes or skin necrosis: Unknown Has patient had a PCN reaction that required hospitalization: Unknown Has patient had a PCN reaction occurring within the last 10 years: Unknown If all of the above answers are "Barnes", then may proceed with Cephalosporin use.   . Aspirin Other (See Comments)    Stomach ache and bleed  . Erythromycin Diarrhea  . Levofloxacin Other  (See Comments)    Headaches, GI upset  . Nsaids Other (See Comments)    stomach bleeding per pt  . Statins Nausea Only and Other (See Comments)    Headache, upset stomach, joint hurt, heartburn  . Sumatriptan Hives and Palpitations  . Adhesive [Tape] Other (See Comments)    blisters  . Ivp Dye [Iodinated Diagnostic Agents]     If made with blue shellfish then CAN NOT have this type of dye.  Pt was given contrast 10/12/17, not premedicated, had Barnes COMPLICATIONS- BLO, CT  . Methylphenidate Hcl     Unknown reaction  . Mirtazapine     Unknown reaction  . Red Yeast Rice [Cholestin] Other (See Comments)  Pt reports causes aching pains like statins do  . Shellfish Allergy     Blue shellfish  . Soy Allergy     Unknown reaction  . Doxycycline Diarrhea  . Hydrocodone Itching  . Hydrocodone-Acetaminophen Itching  . Latex Other (See Comments)    blisters  . Rofecoxib Nausea Only  . Sertraline Hcl Nausea Only  . Sulfonamide Derivatives Itching   Family History  Problem Relation Age of Onset  . Diabetes Mother   . Heart disease Father   . Diabetes Father   . Diabetes Sister   . Breast cancer Maternal Grandmother   . Heart disease Maternal Uncle        x 2  . Heart disease Maternal Aunt   . Kidney disease Paternal Uncle        questionable  . Heart disease Son   . Healthy Son   . Irritable bowel syndrome Son   . Colon cancer Neg Hx   . Colon polyps Neg Hx   . Rectal cancer Neg Hx   . Stomach cancer Neg Hx     Current Outpatient Medications (Endocrine & Metabolic):  .  estradiol (ESTRACE) 0.5 MG tablet, Take 1 tablet (0.5 mg total) by mouth daily. Marland Kitchen  levothyroxine (SYNTHROID) 75 MCG tablet, TAKE 1 TABLET BY MOUTH EVERY DAY IN THE MORNING  Current Outpatient Medications (Cardiovascular):  .  triamterene-hydrochlorothiazide (MAXZIDE-25) 37.5-25 MG tablet, TAKE 1 TABLET BY MOUTH DAILY  Current Outpatient Medications (Respiratory):  .  benzonatate (TESSALON) 200 MG capsule,  Take 1 capsule (200 mg total) by mouth 3 (three) times daily as needed for cough.  Current Outpatient Medications (Analgesics):  .  acetaminophen (TYLENOL) 500 MG tablet, Take 1,000 mg by mouth every 6 (six) hours as needed for mild pain, moderate pain, fever or headache. Marland Kitchen  HYDROmorphone (DILAUDID) 4 MG tablet, Take 1 tablet (4 mg total) by mouth every 4 (four) hours as needed for severe pain (chronic back pain).   Current Outpatient Medications (Other):  Marland Kitchen  ALPRAZolam (XANAX) 0.5 MG tablet, TAKE 1 TABLET(0.5 MG) BY MOUTH THREE TIMES DAILY AS NEEDED FOR ANXIETY .  Amino Acids (AMINO ACID PO), Take 1 capsule by mouth 2 (two) times daily. .  Ascorbic Acid (VITAMIN C) 1000 MG tablet, Take 1,000 mg by mouth 2 (two) times daily. .  B Complex-Biotin-FA (B COMPLETE) TABS, Take 1 tablet by mouth daily. B Complete 100mg  .  buPROPion (WELLBUTRIN XL) 300 MG 24 hr tablet, TAKE 1 TABLET(300 MG) BY MOUTH DAILY .  Cholecalciferol (VITAMIN D3) 1000 units CAPS, Take 1,000 Units by mouth daily. .  Coenzyme Q10 (CO Q-10) 200 MG CAPS, Take 1 capsule by mouth 2 (two) times daily. Marland Kitchen  ECHINACEA PO, Take 1 tablet by mouth 2 (two) times daily. Marland Kitchen  ELDERBERRY PO, Take by mouth daily. Marland Kitchen  gabapentin (NEURONTIN) 300 MG capsule, Take 1 capsule (300 mg total) by mouth 3 (three) times daily. Marland Kitchen  GOLDEN SEAL PO, Take by mouth daily. Nyoka Cowden Tea, Camellia sinensis, (EGCG) POWD, Take 1 capsule by mouth daily. .  Homeopathic Products (ARNICA [place]) PLLT, Take by mouth daily. Marland Kitchen  KRILL OIL PO, Take by mouth daily. Marland Kitchen  LECITHIN PO, Take 1 tablet by mouth daily. Marland Kitchen  MAGNESIUM ASPARTATE PO, Take 1 tablet by mouth 2 (two) times daily. .  meclizine (ANTIVERT) 25 MG tablet, Take 1 tablet (25 mg total) by mouth 3 (three) times daily as needed for dizziness. .  Misc. Devices (ROLLATOR) MISC, Use  rollator for ambulation .  Multiple Vitamins-Minerals (ZINC PO), Take by mouth daily. .  ondansetron (ZOFRAN) 4 MG tablet, TAKE 1 TABLET(4  MG) BY MOUTH EVERY 8 HOURS AS NEEDED FOR NAUSEA OR VOMITING .  ondansetron (ZOFRAN-ODT) 8 MG disintegrating tablet, Take 1 tablet (8 mg total) by mouth every 8 (eight) hours as needed for nausea or vomiting. Marland Kitchen  POTASSIUM AMINOBENZOATE PO, Take 1 tablet by mouth daily. .  prochlorperazine (COMPAZINE) 10 MG tablet, Take 1 tablet (10 mg total) by mouth every 6 (six) hours as needed for nausea or vomiting. .  TURMERIC PO, Take 1 tablet by mouth 2 (two) times daily.  .  vitamin E 400 UNIT capsule, Take 400 Units by mouth daily. .  AMBULATORY NON FORMULARY MEDICATION, 1 Units by Other route once for 1 dose. 4 prong cane   Reviewed prior external information including notes and imaging from  primary care provider As well as notes that were available from care everywhere and other healthcare systems.  Past medical history, social, surgical and family history all reviewed in electronic medical record.  Barnes pertanent information unless stated regarding to the chief complaint.   Review of Systems:  Barnes headache, visual changes, nausea, vomiting, diarrhea, constipation, dizziness, abdominal pain, skin rash, fevers, chills, night sweats, weight loss, swollen lymph nodes, , chest pain, shortness of breath, mood changes. POSITIVE muscle aches, body aches, joint swelling  Objective  Blood pressure 122/86, pulse (!) 111, height 4\' 11"  (1.499 m), weight 113 lb (51.3 kg), SpO2 97 %.   General: Barnes apparent distress alert a oriented x2. HEENT: Pupils equal, extraocular movements intact  Respiratory: Patient's speak in full sentences and does not appear short of breath  Cardiovascular: Trace lower extremity edema, non tender, Barnes erythema  Neuro: Cranial nerves II through XII are intact, s.  Gait significant difficulty with ambulation patient was in the wheelchair for most of the exam. MSK: Significant arthritic changes of multiple joints. Patient back exam does have pain out of proportion to the amount of  palpation.  Patient has a positive straight leg test though that is severe at 20 degrees of forward flexion.  Possible mild numbness noted.  Patient has difficulty with hip abduction as well as hip flexion. Did not do more range of motion testing of the back secondary to discomfort involuntary guarding.   Impression and Recommendations:     This case required medical decision making of moderate complexity. The above documentation has been reviewed and is accurate and complete Lyndal Pulley, DO       Note: This dictation was prepared with Dragon dictation along with smaller phrase technology. Any transcriptional errors that result from this process are unintentional.

## 2019-10-20 ENCOUNTER — Encounter: Payer: Self-pay | Admitting: Internal Medicine

## 2019-10-20 MED ORDER — HYDROMORPHONE HCL 4 MG PO TABS: 4.0000 mg | ORAL_TABLET | ORAL | 0 refills | Status: DC | PRN

## 2019-10-28 ENCOUNTER — Other Ambulatory Visit: Payer: Self-pay | Admitting: Internal Medicine

## 2019-10-28 DIAGNOSIS — F419 Anxiety disorder, unspecified: Secondary | ICD-10-CM

## 2019-11-06 ENCOUNTER — Other Ambulatory Visit: Payer: Self-pay | Admitting: Internal Medicine

## 2019-11-06 ENCOUNTER — Encounter: Payer: Self-pay | Admitting: Physical Medicine and Rehabilitation

## 2019-11-06 ENCOUNTER — Encounter: Payer: Self-pay | Admitting: Internal Medicine

## 2019-11-06 DIAGNOSIS — G8929 Other chronic pain: Secondary | ICD-10-CM

## 2019-11-06 DIAGNOSIS — M5440 Lumbago with sciatica, unspecified side: Secondary | ICD-10-CM

## 2019-11-06 MED ORDER — HYDROMORPHONE HCL 4 MG PO TABS: 4.0000 mg | ORAL_TABLET | ORAL | 0 refills | Status: DC | PRN

## 2019-11-06 NOTE — Addendum Note (Signed)
Addended by: Binnie Rail on: 11/06/2019 12:19 PM   Modules accepted: Orders

## 2019-11-08 ENCOUNTER — Encounter: Payer: Self-pay | Admitting: Internal Medicine

## 2019-11-09 ENCOUNTER — Encounter: Payer: Self-pay | Admitting: Family Medicine

## 2019-11-09 ENCOUNTER — Other Ambulatory Visit: Payer: Self-pay

## 2019-11-09 ENCOUNTER — Ambulatory Visit: Payer: Medicare Other | Admitting: Family Medicine

## 2019-11-09 DIAGNOSIS — M170 Bilateral primary osteoarthritis of knee: Secondary | ICD-10-CM

## 2019-11-09 NOTE — Progress Notes (Signed)
[place] Phone: [phone] Subjective:   Laura Barnes, am serving as a scribe for Dr. Hulan Saas. This visit occurred during the SARS-CoV-2 public health emergency.  Safety protocols were in place, including screening questions prior to the visit, additional usage of staff PPE, and extensive cleaning of exam room while observing appropriate contact time as indicated for disinfecting solutions.   I'm seeing this patient by the request  of:  Binnie Rail, MD  CC: Left knee pain  FBP:[id number]   10/18/2019 Patient is having lumbar radiculopathy.  Consistent with her L4 nerve roots.  Patient's MRI is very consistent with this that she recently got.  It appears the patient is scheduled for another 1 secondary to some mild movement artifact.  I do believe that secondary to the weakness, difficulty with ambulation that I do think she needs to follow-up with her neurosurgeons.  Toradol given today because patient does feel like it does help initially.  Discussed with her though that I do not feel with patient's allergies and chronic narcotics that there is much else we can do for her.  Patient has been referred to pain management by another family physician as well.  Encouraged her to continue to try to be active when possible.  Declined physical therapy.  I do feel that once again in follow-up with her neurosurgeon is more important  Update 11/09/2019 Laura Barnes is a 79 y.o. female coming in with complaint of left knee pain. Pain began after last visit. Feels pain throughout joint. Using Tylenol and hydromorphone for pain. Pain in both weight and non weightbearing positions.  Patient states that he is having increasing instability.  Last injections was February 2020. Known arthritic changes of the knee.  Patient has some increasing instability.  Patient does have chronic pain to say the least.    Past Medical  History:  Diagnosis Date  . ALLERGIC RHINITIS   . Allergy   . Anemia   . ANXIETY   . Arthritis    hands  . Bronchitis   . Chronic fatigue fibromyalgia syndrome   . Complication of anesthesia    says one time waking up she couldn't breathe, felt like her throat closing up  . DEPRESSION   . Enthesopathy of hip region   . Family history of anesthesia complication    sister with n/v  . FOOT PAIN, BILATERAL   . GERD   . Hiatal hernia   . HYPERLIPIDEMIA   . Hypothyroidism   . IBS   . MIGRAINE HEADACHE   . MIGRAINE, COMMON   . NECK MASS   . OSTEOPENIA   . OSTEOPOROSIS   . PLANTAR FASCIITIS   . RASH-NONVESICULAR   . SINUSITIS- ACUTE-NOS   . SKIN LESION   . VAGINITIS   . VENOUS INSUFFICIENCY, CHRONIC    Past Surgical History:  Procedure Laterality Date  . APPENDECTOMY    . BREAST ENHANCEMENT SURGERY    . BREAST IMPLANT REMOVAL    . CHOLECYSTECTOMY    . COLONOSCOPY    . FOOT SURGERY  11/06/2016  . MASTECTOMY     bilateral for severe bilateral fibrocystic disease  . OOPHORECTOMY    . s/p neck lump removal    . SHOULDER SURGERY    . SINUS ENDO W/FUSION Right 06/30/2013   Procedure: RIGHT ENDOSCOPIC SPHENOIDECTOMY WITH FUSION SCAN;  Surgeon: [name], MD;  Location: [facility];  Service: ENT;  Laterality: Right;  . TONSILLECTOMY    . VAGINAL HYSTERECTOMY     Social History   Socioeconomic History  . Marital status: Widowed    Spouse name: Not on file  . Number of children: 2  . Years of education: Not on file  . Highest education level: Not on file  Occupational History  . Occupation: retired Press photographer  Tobacco Use  . Smoking status: Never Smoker  . Smokeless tobacco: Never Used  Vaping Use  . Vaping Use: Never used  Substance and Sexual Activity  . Alcohol use: Barnes  . Drug use: Barnes  . Sexual activity: Not Currently  Other Topics Concern  . Not on file  Social History Narrative   Has 2 biological children and 1 step child   Social Determinants of Health     Financial Resource Strain:   . Difficulty of Paying Living Expenses:   Food Insecurity:   . Worried About Charity fundraiser in the Last Year:   . Arboriculturist in the Last Year:   Transportation Needs:   . Film/video editor (Medical):   Marland Kitchen Lack of Transportation (Non-Medical):   Physical Activity:   . Days of Exercise per Week:   . Minutes of Exercise per Session:   Stress:   . Feeling of Stress :   Social Connections:   . Frequency of Communication with Friends and Family:   . Frequency of Social Gatherings with Friends and Family:   . Attends Religious Services:   . Active Member of Clubs or Organizations:   . Attends Archivist Meetings:   Marland Kitchen Marital Status:    Allergies  Allergen Reactions  . Prednisone Anaphylaxis    Patient unable to remember why she needed to steroid shot but just knows that when she got back to work she started having a lot of trouble breathing.  (jkl 05/04/14)  Multiple epidural steroid injections with depomedrol with Barnes issues. (05/04/18)  . Amoxicillin-Pot Clavulanate Hives and Diarrhea    Has patient had a PCN reaction causing immediate rash, facial/tongue/throat swelling, SOB or lightheadedness with hypotension: Unknown Has patient had a PCN reaction causing severe rash involving mucus membranes or skin necrosis: Unknown Has patient had a PCN reaction that required hospitalization: Unknown Has patient had a PCN reaction occurring within the last 10 years: Unknown If all of the above answers are "Barnes", then may proceed with Cephalosporin use.   . Aspirin Other (See Comments)    Stomach ache and bleed  . Erythromycin Diarrhea  . Levofloxacin Other (See Comments)    Headaches, GI upset  . Nsaids Other (See Comments)    stomach bleeding per pt  . Statins Nausea Only and Other (See Comments)    Headache, upset stomach, joint hurt, heartburn  . Sumatriptan Hives and Palpitations  . Adhesive [Tape] Other (See Comments)    blisters   . Ivp Dye [Iodinated Diagnostic Agents]     If made with blue shellfish then CAN NOT have this type of dye.  Pt was given contrast 10/12/17, not premedicated, had Barnes COMPLICATIONS- BLO, CT  . Methylphenidate Hcl     Unknown reaction  . Mirtazapine     Unknown reaction  . Red Yeast Rice [Cholestin] Other (See Comments)    Pt reports causes aching pains like statins do  . Shellfish Allergy     Blue shellfish  . Soy Allergy     Unknown reaction  . Doxycycline Diarrhea  . Hydrocodone  Itching  . Hydrocodone-Acetaminophen Itching  . Latex Other (See Comments)    blisters  . Rofecoxib Nausea Only  . Sertraline Hcl Nausea Only  . Sulfonamide Derivatives Itching   Family History  Problem Relation Age of Onset  . Diabetes Mother   . Heart disease Father   . Diabetes Father   . Diabetes Sister   . Breast cancer Maternal Grandmother   . Heart disease Maternal Uncle        x 2  . Heart disease Maternal Aunt   . Kidney disease Paternal Uncle        questionable  . Heart disease Son   . Healthy Son   . Irritable bowel syndrome Son   . Colon cancer Neg Hx   . Colon polyps Neg Hx   . Rectal cancer Neg Hx   . Stomach cancer Neg Hx     Current Outpatient Medications (Endocrine & Metabolic):  .  estradiol (ESTRACE) 0.5 MG tablet, Take 1 tablet (0.5 mg total) by mouth daily. Marland Kitchen  levothyroxine (SYNTHROID) 75 MCG tablet, TAKE 1 TABLET BY MOUTH EVERY DAY IN THE MORNING  Current Outpatient Medications (Cardiovascular):  .  triamterene-hydrochlorothiazide (MAXZIDE-25) 37.5-25 MG tablet, TAKE 1 TABLET BY MOUTH DAILY  Current Outpatient Medications (Respiratory):  .  benzonatate (TESSALON) 200 MG capsule, Take 1 capsule (200 mg total) by mouth 3 (three) times daily as needed for cough.  Current Outpatient Medications (Analgesics):  .  acetaminophen (TYLENOL) 500 MG tablet, Take 1,000 mg by mouth every 6 (six) hours as needed for mild pain, moderate pain, fever or headache. Marland Kitchen  HYDROmorphone  (DILAUDID) 4 MG tablet, Take 1 tablet (4 mg total) by mouth every 4 (four) hours as needed for severe pain (chronic back pain).   Current Outpatient Medications (Other):  Marland Kitchen  ALPRAZolam (XANAX) 0.5 MG tablet, TAKE 1 TABLET(0.5 MG) BY MOUTH THREE TIMES DAILY AS NEEDED FOR ANXIETY .  Amino Acids (AMINO ACID PO), Take 1 capsule by mouth 2 (two) times daily. .  Ascorbic Acid (VITAMIN C) 1000 MG tablet, Take 1,000 mg by mouth 2 (two) times daily. .  B Complex-Biotin-FA (B COMPLETE) TABS, Take 1 tablet by mouth daily. B Complete 100mg  .  buPROPion (WELLBUTRIN XL) 300 MG 24 hr tablet, TAKE 1 TABLET(300 MG) BY MOUTH DAILY .  Cholecalciferol (VITAMIN D3) 1000 units CAPS, Take 1,000 Units by mouth daily. .  Coenzyme Q10 (CO Q-10) 200 MG CAPS, Take 1 capsule by mouth 2 (two) times daily. Marland Kitchen  ECHINACEA PO, Take 1 tablet by mouth 2 (two) times daily. Marland Kitchen  ELDERBERRY PO, Take by mouth daily. Marland Kitchen  gabapentin (NEURONTIN) 300 MG capsule, Take 1 capsule (300 mg total) by mouth 3 (three) times daily. Marland Kitchen  GOLDEN SEAL PO, Take by mouth daily. Nyoka Cowden Tea, Camellia sinensis, (EGCG) POWD, Take 1 capsule by mouth daily. .  Homeopathic Products (ARNICA [place]) PLLT, Take by mouth daily. Marland Kitchen  KRILL OIL PO, Take by mouth daily. Marland Kitchen  LECITHIN PO, Take 1 tablet by mouth daily. Marland Kitchen  MAGNESIUM ASPARTATE PO, Take 1 tablet by mouth 2 (two) times daily. .  meclizine (ANTIVERT) 25 MG tablet, Take 1 tablet (25 mg total) by mouth 3 (three) times daily as needed for dizziness. .  Misc. Devices (ROLLATOR) MISC, Use rollator for ambulation .  Multiple Vitamins-Minerals (ZINC PO), Take by mouth daily. .  ondansetron (ZOFRAN) 4 MG tablet, TAKE 1 TABLET(4 MG) BY MOUTH EVERY 8 HOURS AS NEEDED FOR NAUSEA OR VOMITING .  ondansetron (ZOFRAN-ODT) 8 MG disintegrating tablet, DISSOLVE 1 TABLET(8 MG) ON THE TONGUE EVERY 8 HOURS AS NEEDED FOR NAUSEA OR VOMITING .  POTASSIUM AMINOBENZOATE PO, Take 1 tablet by mouth daily. .  prochlorperazine (COMPAZINE)  10 MG tablet, Take 1 tablet (10 mg total) by mouth every 6 (six) hours as needed for nausea or vomiting. .  TURMERIC PO, Take 1 tablet by mouth 2 (two) times daily.  .  vitamin E 400 UNIT capsule, Take 400 Units by mouth daily.   Reviewed prior external information including notes and imaging from  primary care provider As well as notes that were available from care everywhere and other healthcare systems.  Past medical history, social, surgical and family history all reviewed in electronic medical record.  Barnes pertanent information unless stated regarding to the chief complaint.   Review of Systems:  Barnes headache, visual changes, nausea, vomiting, diarrhea, constipation, dizziness, abdominal pain, skin rash, fevers, chills, night sweats, weight loss, swollen lymph nodes,  chest pain, shortness of breath, mood changes. POSITIVE muscle aches, body aches, joint swelling  Objective  Blood pressure 110/82, pulse (!) 108, height 4\' 11"  (1.499 m), weight 110 lb (49.9 kg), SpO2 98 %.   General: Barnes apparent distress alert and oriented x3 mood and affect normal, dressed appropriately.  Patient did talk a significant amount with tangential thought process  Knee: Left valgus deformity noted. Large thigh to calf ratio.  Tender to palpation over medial and PF joint line.  ROM full in flexion and extension and lower leg rotation. instability with valgus force.  painful patellar compression. Patellar glide with moderate crepitus. Patellar and quadriceps tendons unremarkable. Hamstring and quadriceps strength is normal. Contralateral knee shows tender to palpation out of the amount of palpation but not as severe as the left side  After informed written and verbal consent, patient was seated on exam table. Left knee was prepped with alcohol swab and utilizing anterolateral approach, patient's left knee space was injected with 4:1  marcaine 0.5%: Kenalog 40mg /dL. Patient tolerated the procedure well  without immediate complications.     Impression and Recommendations:     The above documentation has been reviewed and is accurate and complete Lyndal Pulley, DO       Note: This dictation was prepared with Dragon dictation along with smaller phrase technology. Any transcriptional errors that result from this process are unintentional.

## 2019-11-09 NOTE — Assessment & Plan Note (Signed)
Left knee injected today.  Tolerated the procedure well.  Discussed icing regimen and home exercise, increase activity slowly over the course the next several weeks.  Patient has many different comorbidities and will avoid doing other things such as topical anti-inflammatories.  Likely not a surgical candidate secondary to her other comorbidities at this time.  Chronic problem with exacerbation.  Discussed multiple different pain management clinics with patient.  Patient will talk to her primary care provider.  Continue the hydromorphone.

## 2019-11-09 NOTE — Patient Instructions (Signed)
Can repeat injection every 3 months Get the MRI and then we can do epidural See me in 3 months

## 2019-11-13 ENCOUNTER — Encounter: Payer: Self-pay | Admitting: Family Medicine

## 2019-11-16 ENCOUNTER — Ambulatory Visit
Admission: RE | Admit: 2019-11-16 | Discharge: 2019-11-16 | Disposition: A | Discharge status: Home / Self Care | Payer: Medicare Other | Source: Ambulatory Visit | Attending: Neurosurgery | Admitting: Neurosurgery

## 2019-11-16 ENCOUNTER — Other Ambulatory Visit: Payer: Self-pay

## 2019-11-16 DIAGNOSIS — M48061 Spinal stenosis, lumbar region without neurogenic claudication: Secondary | ICD-10-CM | POA: Diagnosis not present

## 2019-11-16 DIAGNOSIS — M5126 Other intervertebral disc displacement, lumbar region: Secondary | ICD-10-CM

## 2019-11-17 ENCOUNTER — Other Ambulatory Visit: Payer: Self-pay | Admitting: Internal Medicine

## 2019-11-20 ENCOUNTER — Ambulatory Visit (INDEPENDENT_AMBULATORY_CARE_PROVIDER_SITE_OTHER): Payer: Medicare Other | Admitting: Family Medicine

## 2019-11-20 ENCOUNTER — Other Ambulatory Visit: Payer: Self-pay

## 2019-11-20 ENCOUNTER — Telehealth: Payer: Self-pay | Admitting: Physical Medicine and Rehabilitation

## 2019-11-20 ENCOUNTER — Encounter: Payer: Self-pay | Admitting: Family Medicine

## 2019-11-20 VITALS — BP 120/70 | HR 112 | Ht 59.0 in | Wt 112.0 lb

## 2019-11-20 DIAGNOSIS — M5416 Radiculopathy, lumbar region: Secondary | ICD-10-CM

## 2019-11-20 DIAGNOSIS — M170 Bilateral primary osteoarthritis of knee: Secondary | ICD-10-CM

## 2019-11-20 DIAGNOSIS — M2550 Pain in unspecified joint: Secondary | ICD-10-CM

## 2019-11-20 DIAGNOSIS — M5418 Radiculopathy, sacral and sacrococcygeal region: Secondary | ICD-10-CM

## 2019-11-20 DIAGNOSIS — G894 Chronic pain syndrome: Secondary | ICD-10-CM | POA: Diagnosis not present

## 2019-11-20 NOTE — Telephone Encounter (Signed)
Scheduled for 7/27 at 1415 with driver.

## 2019-11-20 NOTE — Telephone Encounter (Signed)
Left message #1

## 2019-11-20 NOTE — Telephone Encounter (Signed)
See previous message

## 2019-11-20 NOTE — Telephone Encounter (Signed)
Patient call back to set an appt for a back injection. Patient stated Loma Sousa called and she is returning her call. Patient phone number is [phone].

## 2019-11-20 NOTE — Telephone Encounter (Signed)
Please advise. Referrals placed today and 6/7 for pain management by different providers.

## 2019-11-20 NOTE — Progress Notes (Signed)
I, Laura Barnes, LAT, ATC, am serving as scribe for Dr. Lynne Leader.  Laura Barnes is a 79 y.o. female who presents to [place] [facility] today for L lower leg pain.  She was last seen by Dr. Tamala Julian on 11/09/19 for her L knee and had a L knee injection.  She was also diagnosed w/ lumbar radiculopathy consistent w/ L4 level.  Since her last visit w/ Dr. Tamala Julian, pt reports that pain around medial, lateral, and superior portion of knee has decreased since injection. Pain is still present over patellar tendon.   Patient's main pain complaint portion of her shin.  She has had epidural steroid injections previously.  She sees Dr. Cyndy Freeze and Dr. Ernestina Patches for this.   Also complaining of sacral back pain when lying in bed.  She sleeps partially upright in a hospital bed.  Is taking Arnica lotion, lavender oil, lidocaine and other topical analgesics.   Additionally her primary care provider referred to to preferred pain management.  She like to go to a different location.  She takes Dilaudid daily for pain.   Pertinent review of systems: No fevers or chills  Relevant historical information: Migraine disorder, chronic pain, GERD   Exam:  BP 120/70   Pulse (!) 112   Ht 4\' 11"  (1.499 m)   Wt 112 lb (50.8 kg)   SpO2 97%   BMI 22.62 kg/m  General: Well Developed, well nourished, and in no acute distress.   MSK:  L-spine nontender midline. Positive left-sided slump test. Intact strength to left foot and great toe dorsiflexion  Sacrum: Nontender midline.  Tender palpation left SI joint region.    Lab and Radiology Results No results found for this or any previous visit (from the past 72 hour(s)). MR LUMBAR SPINE WO CONTRAST  Result Date: 11/17/2019 CLINICAL DATA:  Severe low back pain with left-sided radiculopathy since 07/31/2019. EXAM: MRI LUMBAR SPINE WITHOUT CONTRAST TECHNIQUE: Multiplanar, multisequence MR imaging of the lumbar spine was performed. No  intravenous contrast was administered. COMPARISON:  10/02/2019 MRI FINDINGS: Segmentation:  Standard. Alignment: 6 mm grade 1 anterolisthesis L4 on L5, unchanged. 4 mm grade 1 anterolisthesis L3 on L4, unchanged. Slight retrolisthesis of L2 on L3 and L5 on S1 is unchanged. Lumbar levocurvature. Vertebrae: No fracture, evidence of discitis, or bone lesion. Multilevel discogenic endplate marrow changes, most pronounced at L4-5. Conus medullaris and cauda equina: Conus extends to the L1 level. Conus and cauda equina appear normal. Paraspinal and other soft tissues: Negative. Disc levels: T12-L1: Bilateral perineural cysts.  No foraminal or canal stenosis. L1-L2: Mild disc bulge. Bilateral perineural cysts. No foraminal or canal stenosis. L2-L3: Posterior disc osteophyte complex with mild right greater than left facet arthropathy results in mild canal stenosis with severe right and mild left foraminal stenosis. No interval progression from prior. L3-L4: Disc uncovering with endplate spurring and bilateral facet arthropathy resulting in mild-to-moderate right and mild left foraminal stenosis with mild canal stenosis. No interval progression from prior. L4-L5: Very large left subarticular disc extrusion (series 11, images 24-27; series 5, images 10-12) extending cranially 1.7 cm to the level of the L4 superior endplate. This results in mass effect on the thecal sac with mild canal stenosis, severe left subarticular recess stenosis and severe left foraminal stenosis. There is also moderate bilateral facet arthropathy at this level. No interval progression from 10/02/2019. L5-S1: Posterior disc osteophyte complex with moderate bilateral facet arthropathy resulting in mild left foraminal stenosis without canal stenosis.  Unchanged. IMPRESSION: 1. No significant interval progression of multilevel degenerative changes of the lumbar spine, as described above. 2. 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 This results in mild canal stenosis, severe left subarticular recess stenosis, and severe left foraminal stenosis. 3. Mild canal stenosis at L2-3 and L3-4. 4. Severe right foraminal stenosis at L2-3. Electronically Signed   By: Davina Poke D.O.   On: 11/17/2019 09:50    I, Lynne Leader, personally (independently) visualized and performed the interpretation of the images attached in this note.    Assessment and Plan: 79 y.o. female with   Left lumbar radiculopathy.  Reviewed recent MRI findings L-spine ordered by Dr. Christella Noa with patient today.  Expressed that it is reasonable to proceed with trial of epidural steroid injection.  She already has a physician who has been providing the services.  Recommend contacting Dr. Ernestina Patches to schedule the epidural steroid injections.  However based on the appearance of large disc fragment impinging on her lumbar nerve root I am not optimistic about epidural steroid injections and expressed that she probably is going to require surgery.  She is very much not looking forward to surgery.   Patient has a follow-up appointment scheduled with Dr. Christella Noa tomorrow.  Low back or sacrum pain.  Certainly could be arising from SI joint or simply the pressure of her low back from a partially upright position while sleeping.  Advised patient how to make an offloading foam cushion to avoid pressure on this region.  If not improved would recommend trial of SI joint injection by myself or Dr. Tamala Julian in the future.  Chronic pain: This issue should ideally be managed by her primary care provider but I am happy to provide some recommendations in a different referral to a different pain management provider.  Expressed that all pain clinics are likely to have bad reviews on [employer] due to the nature of chronic pain management.  Refer to [facility].  CC: Dr Ernestina Patches and Christella Noa  Orders Placed This Encounter  Procedures  .  Ambulatory referral to Pain Clinic    Referral Priority:   Routine    Referral Type:   Consultation    Referral Reason:   Specialty Services Required    Requested Specialty:   Pain Medicine    Number of Visits Requested:   1   No orders of the defined types were placed in this encounter.    Discussed warning signs or symptoms. Please see discharge instructions. Patient expresses understanding.   The above documentation has been reviewed and is accurate and complete Lynne Leader, M.D.  Total encounter time 40 minutes including charting time date of service. MRI review plan and treatment plan

## 2019-11-20 NOTE — Patient Instructions (Addendum)
Thank you for coming in today.  Please contact Dr Ernestina Patches and get a back injection.  Lets see what Dr Christella Noa says  I referred you to [facility].  Do some research on your own as well.

## 2019-11-20 NOTE — Telephone Encounter (Signed)
Patient called requesting an appointment with Dr. Ernestina Patches.

## 2019-11-20 NOTE — Telephone Encounter (Signed)
She needs comprehensive pain mgt, I can do L4 and L5 tf esi and she has follow up with Dr. Arneta Cliche based on large disc herniation.

## 2019-11-22 ENCOUNTER — Ambulatory Visit: Payer: Medicare Other | Admitting: Specialist

## 2019-11-23 ENCOUNTER — Encounter: Payer: Self-pay | Admitting: Family Medicine

## 2019-11-23 DIAGNOSIS — M5416 Radiculopathy, lumbar region: Secondary | ICD-10-CM

## 2019-11-27 DIAGNOSIS — M4316 Spondylolisthesis, lumbar region: Secondary | ICD-10-CM | POA: Diagnosis not present

## 2019-11-27 DIAGNOSIS — M5126 Other intervertebral disc displacement, lumbar region: Secondary | ICD-10-CM | POA: Diagnosis not present

## 2019-11-28 ENCOUNTER — Other Ambulatory Visit: Payer: Self-pay | Admitting: Internal Medicine

## 2019-11-28 ENCOUNTER — Telehealth: Payer: Self-pay | Admitting: Physical Medicine and Rehabilitation

## 2019-11-28 DIAGNOSIS — F419 Anxiety disorder, unspecified: Secondary | ICD-10-CM

## 2019-11-28 NOTE — Telephone Encounter (Signed)
Patient called to cancel appt. Please call patient to cancel. Patient phone number is [phone]

## 2019-11-28 NOTE — Telephone Encounter (Signed)
Called pt and she has an appt with [facility], appt is cancelled.

## 2019-12-01 ENCOUNTER — Ambulatory Visit
Admission: RE | Admit: 2019-12-01 | Discharge: 2019-12-01 | Disposition: A | Discharge status: Home / Self Care | Payer: Medicare Other | Source: Ambulatory Visit | Attending: Family Medicine | Admitting: Family Medicine

## 2019-12-01 ENCOUNTER — Other Ambulatory Visit: Payer: Self-pay

## 2019-12-01 DIAGNOSIS — M5126 Other intervertebral disc displacement, lumbar region: Secondary | ICD-10-CM | POA: Diagnosis not present

## 2019-12-01 DIAGNOSIS — M5416 Radiculopathy, lumbar region: Secondary | ICD-10-CM

## 2019-12-01 MED ORDER — IOPAMIDOL (ISOVUE-M 200) INJECTION 41%
1.0000 mL | Freq: Once | INTRAMUSCULAR | Status: AC
  Administered 2019-12-01: 1 mL via EPIDURAL

## 2019-12-01 MED ORDER — METHYLPREDNISOLONE ACETATE 40 MG/ML INJ SUSP (RADIOLOG
120.0000 mg | Freq: Once | INTRAMUSCULAR | Status: AC
  Administered 2019-12-01: 120 mg via EPIDURAL

## 2019-12-01 NOTE — Discharge Instructions (Signed)

## 2019-12-05 ENCOUNTER — Encounter: Payer: Self-pay | Admitting: Internal Medicine

## 2019-12-05 ENCOUNTER — Encounter: Payer: Self-pay | Admitting: Family Medicine

## 2019-12-06 ENCOUNTER — Other Ambulatory Visit: Payer: Self-pay

## 2019-12-06 ENCOUNTER — Ambulatory Visit: Payer: Medicare Other | Admitting: Family Medicine

## 2019-12-06 ENCOUNTER — Encounter: Payer: Self-pay | Admitting: Family Medicine

## 2019-12-06 VITALS — BP 142/70 | HR 92 | Ht 59.0 in | Wt 112.0 lb

## 2019-12-06 DIAGNOSIS — M170 Bilateral primary osteoarthritis of knee: Secondary | ICD-10-CM

## 2019-12-06 DIAGNOSIS — M5416 Radiculopathy, lumbar region: Secondary | ICD-10-CM

## 2019-12-06 DIAGNOSIS — G8929 Other chronic pain: Secondary | ICD-10-CM | POA: Diagnosis not present

## 2019-12-06 DIAGNOSIS — G894 Chronic pain syndrome: Secondary | ICD-10-CM

## 2019-12-06 DIAGNOSIS — M25562 Pain in left knee: Secondary | ICD-10-CM | POA: Diagnosis not present

## 2019-12-06 NOTE — Progress Notes (Signed)
   I, Kandace Blitz, LAT, ATC, am serving as scribe for Dr. Lynne Leader.  Chloe Bluett Kollmann is a 79 y.o. female who presents to [place] at [facility] today for f/u of L knee pain.  She was last seen by Dr. Georgina Snell on 11/20/19 for her L lower leg pain and lumbar radiculopathy at the L4 level.  She was advised to get another epidural and was referred to a pain clinic.  Since her last visit, pt reports that the back injections seem to be helping but notes con't L knee pain. Patient states that her back is doing better and her pain is less. The left knee is painful. States the "bones feel broken". The patient is feeling pain in the lateral left knee.  She had left knee steroid injection about a month ago with Dr. Tamala Julian.  This did not provide lasting benefit.  She is intolerant of NSAIDs and cannot use topical Voltaren gel.  She has not had trial of hyaluronic acid injections yet.  She is in the process of being referred to pain management she formally was taking Dilaudid 4 mg pills.  Diagnostic imaging: L-spine MRI- 11/16/19, 10/02/19; B standing knee XR- 07/08/18  Pertinent review of systems: No fevers or chills  Relevant historical information: Migraine, chronic pain   Exam:  BP (!) 142/70 (BP Location: Left Arm, Patient Position: Sitting)   Pulse 92   Ht 4\' 11"  (1.499 m)   Wt 112 lb (50.8 kg)   SpO2 97%   BMI 22.62 kg/m  General: Well Developed, well nourished, and in no acute distress.   MSK: Left knee normal-appearing.  Tender palpation lateral joint line.  Normal motion with crepitation.    Lab and Radiology Results EXAM: BILATERAL KNEES STANDING - 1 VIEW  COMPARISON:  Left knee 03/05/2016  FINDINGS: Mild joint space narrowing in the medial and patellofemoral compartments. Early spurring in the lateral compartment and along the tibial spines on the right. Cannot assessed patellofemoral compartments on this single AP view. No acute bony  abnormality. Specifically, no fracture, subluxation, or dislocation.  IMPRESSION: Mild joint space narrowing bilaterally. Early spurring on the right.   Electronically Signed   By: Rolm Baptise M.D.   On: 07/08/2018 20:59 I, Lynne Leader, personally (independently) visualized and performed the interpretation of the images attached in this note.     Assessment and Plan: 79 y.o. female with persistent left knee pain.  Failing conservative management.  Last steroid injection did not provide lasting relief and was performed less than a month ago.  Work on authorization for hyaluronic acid injections now and have patient return once approved.   Patient had significant benefit from epidural steroid injection however can repeat that as needed in the future.   Discussed warning signs or symptoms. Please see discharge instructions. Patient expresses understanding.   The above documentation has been reviewed and is accurate and complete Lynne Leader, M.D.  Total encounter time 20 minutes including charting time date of service. Discussed options and treatment plan

## 2019-12-06 NOTE — Patient Instructions (Addendum)
Thank you for coming in today.  Plan for continued tylenol for knee pain.  We will authorize the gel shots.   We should hear soon about the gel shots and get you scheduled soon.   Follow up with Dr Quay Burow soon about your anxiety.

## 2019-12-10 NOTE — Progress Notes (Signed)
Subjective:    Patient ID: Laura Barnes, female    DOB: 09/19/1940, 79 y.o.   MRN: [id number]  HPI The patient is here for an acute visit.  Panic attacks:  Her anxiety and depression are worse. She is talking Wellbutrin daily.  She wonders about increasing it. She has been waking up with panic attacks recently.     She is in a lot of pain - her left knee and back.  She is taking tylenol.  She sees pain management on Friday.     Medications and allergies reviewed with patient and updated if appropriate.  Patient Active Problem List   Diagnosis Date Noted  . 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 Scoliosis of thoracolumbar spine 07/11/2019  . Choking 03/24/2019  . Hypokalemia 03/24/2019  . Fatigue 12/31/2018  . Leg swelling 09/14/2018  . Degenerative arthritis of knee, bilateral 07/05/2018  . Protrusion of lumbar intervertebral disc 04/20/2018  . Difficulty urinating 11/24/2017  . Dizziness 08/11/2017  . Sacroiliac pain 06/17/2017  . Greater trochanteric bursitis of right hip 05/27/2017  . Vertigo 04/29/2017  . Dysphagia 10/28/2016  . Lump of skin 05/11/2016  . 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 Chronic meniscal tear of knee 03/05/2016  . Nausea 03/04/2016  . Osteopenia 12/03/2015  . Thyroid nodule 11/16/2015  . Bilateral leg edema 11/05/2015  . Anterior neck pain 11/05/2015  . Hormone replacement therapy (HRT) 11/05/2015  . Subacromial bursitis 09/16/2015  . 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 Trochanteric bursitis of both hips 03/12/2015  . Subacromial impingement of left shoulder 03/12/2015  . Hyperlipidemia 05/01/2014  . Chronic pain syndrome 05/01/2014  . Lumbar radiculopathy 03/27/2014  . Left shoulder pain 11/07/2013  . Degeneration of intervertebral disc of cervical region 10/10/2013  . Fibromyalgia 09/06/2013  . Bilateral shoulder pain 09/06/2013  . Chronic cough 04/12/2013  .  Sinusitis, chronic 04/12/2013  . Hypothyroidism 04/07/2012  . Chronic venous insufficiency 04/02/2010  . Enthesopathy of hip region 07/19/2007  . Osteoporosis 07/19/2007  . Anxiety 01/23/2007  . Depression 01/23/2007  . Migraine 01/23/2007  . NINAR (noninfectious nonallergic rhinitis) 01/23/2007  . Gastroesophageal reflux disease 01/23/2007  . IBS 01/23/2007    Current Outpatient Medications on File Prior to Visit  Medication Sig Dispense Refill  . acetaminophen (TYLENOL) 500 MG tablet Take 1,000 mg by mouth every 6 (six) hours as needed for mild pain, moderate pain, fever or headache.    . ALPRAZolam (XANAX) 0.5 MG tablet TAKE 1 TABLET(0.5 MG) BY MOUTH THREE TIMES DAILY AS NEEDED FOR ANXIETY 90 tablet 0  . Amino Acids (AMINO ACID PO) Take 1 capsule by mouth 2 (two) times daily.    . Ascorbic Acid (VITAMIN C) 1000 MG tablet Take 1,000 mg by mouth 2 (two) times daily.    . B Complex-Biotin-FA (B COMPLETE) TABS Take 1 tablet by mouth daily. B Complete 100mg     . benzonatate (TESSALON) 200 MG capsule Take 1 capsule (200 mg total) by mouth 3 (three) times daily as needed for cough. 90 capsule 5  . Cholecalciferol (VITAMIN D3) 1000 units CAPS Take 1,000 Units by mouth daily.    . Coenzyme Q10 (CO Q-10) 200 MG CAPS Take 1 capsule by mouth 2 (two) times daily.    Marland Kitchen ECHINACEA PO Take 1 tablet by mouth 2 (two) times daily.    Marland Kitchen ELDERBERRY PO Take by mouth daily.    Marland Kitchen estradiol (ESTRACE) 0.5 MG tablet  Take 1 tablet (0.5 mg total) by mouth daily. 90 tablet 1  . fluticasone (FLONASE) 50 MCG/ACT nasal spray Place 2 sprays into both nostrils daily.    Marland Kitchen GOLDEN SEAL PO Take by mouth daily.    [place]oka Cowden Tea, Camellia sinensis, (EGCG) POWD Take 1 capsule by mouth daily.    . Homeopathic Products (ARNICA [place]) PLLT Take by mouth daily.    Marland Kitchen KRILL OIL PO Take by mouth daily.    Marland Kitchen LECITHIN PO Take 1 tablet by mouth daily.    Marland Kitchen levothyroxine (SYNTHROID) 75 MCG tablet TAKE 1 TABLET BY MOUTH EVERY DAY IN  THE MORNING 90 tablet 1  . MAGNESIUM ASPARTATE PO Take 1 tablet by mouth 2 (two) times daily.    . meclizine (ANTIVERT) 25 MG tablet Take 1 tablet (25 mg total) by mouth 3 (three) times daily as needed for dizziness. 60 tablet 5  . Misc. Devices (ROLLATOR) MISC Use rollator for ambulation 1 each 0  . Multiple Vitamins-Minerals (ZINC PO) Take by mouth daily.    . ondansetron (ZOFRAN) 4 MG tablet TAKE 1 TABLET(4 MG) BY MOUTH EVERY 8 HOURS AS NEEDED FOR NAUSEA OR VOMITING 30 tablet 0  . ondansetron (ZOFRAN-ODT) 8 MG disintegrating tablet DISSOLVE 1 TABLET(8 MG) ON THE TONGUE EVERY 8 HOURS AS NEEDED FOR NAUSEA OR VOMITING 30 tablet 2  . POTASSIUM AMINOBENZOATE PO Take 1 tablet by mouth daily.    . prochlorperazine (COMPAZINE) 10 MG tablet Take 1 tablet (10 mg total) by mouth every 6 (six) hours as needed for nausea or vomiting. 30 tablet 0  . triamterene-hydrochlorothiazide (MAXZIDE-25) 37.5-25 MG tablet TAKE 1 TABLET BY MOUTH DAILY 90 tablet 1  . TURMERIC PO Take 1 tablet by mouth 2 (two) times daily.     . vitamin E 400 UNIT capsule Take 400 Units by mouth daily.     No current facility-administered medications on file prior to visit.    Past Medical History:  Diagnosis Date  . ALLERGIC RHINITIS   . Allergy   . Anemia   . ANXIETY   . Arthritis    hands  . Bronchitis   . Chronic fatigue fibromyalgia syndrome   . Complication of anesthesia    says one time waking up she couldn't breathe, felt like her throat closing up  . DEPRESSION   . Enthesopathy of hip region   . Family history of anesthesia complication    sister with n/v  . FOOT PAIN, BILATERAL   . GERD   . Hiatal hernia   . HYPERLIPIDEMIA   . Hypothyroidism   . IBS   . MIGRAINE HEADACHE   . MIGRAINE, COMMON   . NECK MASS   . OSTEOPENIA   . OSTEOPOROSIS   . PLANTAR FASCIITIS   . RASH-NONVESICULAR   . SINUSITIS- ACUTE-NOS   . SKIN LESION   . VAGINITIS   . VENOUS INSUFFICIENCY, CHRONIC     Past Surgical History:    Procedure Laterality Date  . APPENDECTOMY    . BREAST ENHANCEMENT SURGERY    . BREAST IMPLANT REMOVAL    . CHOLECYSTECTOMY    . COLONOSCOPY    . FOOT SURGERY  11/06/2016  . MASTECTOMY     bilateral for severe bilateral fibrocystic disease  . OOPHORECTOMY    . s/p neck lump removal    . SHOULDER SURGERY    . SINUS ENDO W/FUSION Right 06/30/2013   Procedure: RIGHT ENDOSCOPIC SPHENOIDECTOMY WITH FUSION SCAN;  Surgeon: [name], MD;  Location: [facility] OR;  Service: ENT;  Laterality: Right;  . TONSILLECTOMY    . VAGINAL HYSTERECTOMY      Social History   Socioeconomic History  . Marital status: Widowed    Spouse name: Not on file  . Number of children: 2  . Years of education: Not on file  . Highest education level: Not on file  Occupational History  . Occupation: retired Press photographer  Tobacco Use  . Smoking status: Never Smoker  . Smokeless tobacco: Never Used  Vaping Use  . Vaping Use: Never used  Substance and Sexual Activity  . Alcohol use: No  . Drug use: No  . Sexual activity: Not Currently  Other Topics Concern  . Not on file  Social History Narrative   Has 2 biological children and 1 step child   Social Determinants of Health   Financial Resource Strain:   . Difficulty of Paying Living Expenses:   Food Insecurity:   . Worried About Charity fundraiser in the Last Year:   . Arboriculturist in the Last Year:   Transportation Needs:   . Film/video editor (Medical):   Marland Kitchen Lack of Transportation (Non-Medical):   Physical Activity:   . Days of Exercise per Week:   . Minutes of Exercise per Session:   Stress:   . Feeling of Stress :   Social Connections:   . Frequency of Communication with Friends and Family:   . Frequency of Social Gatherings with Friends and Family:   . Attends Religious Services:   . Active Member of Clubs or Organizations:   . Attends Archivist Meetings:   Marland Kitchen Marital Status:     Family History  Problem Relation Age of  Onset  . Diabetes Mother   . Heart disease Father   . Diabetes Father   . Diabetes Sister   . Breast cancer Maternal Grandmother   . Heart disease Maternal Uncle        x 2  . Heart disease Maternal Aunt   . Kidney disease Paternal Uncle        questionable  . Heart disease Son   . Healthy Son   . Irritable bowel syndrome Son   . Colon cancer Neg Hx   . Colon polyps Neg Hx   . Rectal cancer Neg Hx   . Stomach cancer Neg Hx     Review of Systems  Constitutional: Positive for appetite change (decreased) and fatigue.  Musculoskeletal: Positive for arthralgias and back pain.  Psychiatric/Behavioral: Positive for decreased concentration, dysphoric mood and sleep disturbance. Negative for suicidal ideas. The patient is nervous/anxious.        Objective:   Vitals:   12/11/19 1332  BP: 128/88  Pulse: (!) 102  Temp: 98 F (36.7 C)  SpO2: 98%   BP Readings from Last 3 Encounters:  12/11/19 128/88  12/06/19 (!) 142/70  12/01/19 (!) 153/86   Wt Readings from Last 3 Encounters:  12/11/19 112 lb (50.8 kg)  12/06/19 112 lb (50.8 kg)  11/20/19 112 lb (50.8 kg)   Body mass index is 22.62 kg/m.   Physical Exam Constitutional:      General: She is not in acute distress.    Appearance: Normal appearance. She is not ill-appearing.  HENT:     Head: Normocephalic and atraumatic.  Skin:    General: Skin is warm and dry.  Neurological:     Mental Status: She is alert.  Psychiatric:  Mood and Affect: Mood normal.        Behavior: Behavior normal.        Thought Content: Thought content normal.        Judgment: Judgment normal.            Assessment & Plan:    See Problem List for Assessment and Plan of chronic medical problems.    This visit occurred during the SARS-CoV-2 public health emergency.  Safety protocols were in place, including screening questions prior to the visit, additional usage of staff PPE, and extensive cleaning of exam room while  observing appropriate contact time as indicated for disinfecting solutions.

## 2019-12-11 ENCOUNTER — Other Ambulatory Visit: Payer: Self-pay

## 2019-12-11 ENCOUNTER — Encounter: Payer: Self-pay | Admitting: Internal Medicine

## 2019-12-11 ENCOUNTER — Ambulatory Visit (INDEPENDENT_AMBULATORY_CARE_PROVIDER_SITE_OTHER): Payer: Medicare Other | Admitting: Internal Medicine

## 2019-12-11 VITALS — BP 128/88 | HR 102 | Temp 98.0°F | Ht 59.0 in | Wt 112.0 lb

## 2019-12-11 DIAGNOSIS — F3289 Other specified depressive episodes: Secondary | ICD-10-CM | POA: Diagnosis not present

## 2019-12-11 DIAGNOSIS — F419 Anxiety disorder, unspecified: Secondary | ICD-10-CM

## 2019-12-11 MED ORDER — BUPROPION HCL ER (XL) 150 MG PO TB24: 150.0000 mg | ORAL_TABLET | Freq: Every day | ORAL | 1 refills | Status: DC

## 2019-12-11 MED ORDER — BUPROPION HCL ER (XL) 300 MG PO TB24: 300.0000 mg | ORAL_TABLET | Freq: Every day | ORAL | 1 refills | Status: DC

## 2019-12-11 NOTE — Assessment & Plan Note (Signed)
Chronic Not controlled  Increase Wellbutrin to 450 mg daily.  She does not want to add a different SSRI to the Wellbutrin due to side effects from them in the past Continue xanax at current dose

## 2019-12-11 NOTE — Assessment & Plan Note (Signed)
Chronic Not controlled  Increase Wellbutrin to 450 mg daily.  She does not want to add a different SSRI to the Wellbutrin due to side effects from them in the past

## 2019-12-11 NOTE — Patient Instructions (Signed)
Increase the Wellbutrin to 450 mg daily.

## 2019-12-15 ENCOUNTER — Ambulatory Visit: Payer: Self-pay

## 2019-12-15 ENCOUNTER — Other Ambulatory Visit: Payer: Self-pay

## 2019-12-15 ENCOUNTER — Ambulatory Visit: Payer: Medicare Other | Admitting: Family Medicine

## 2019-12-15 DIAGNOSIS — G8929 Other chronic pain: Secondary | ICD-10-CM

## 2019-12-15 DIAGNOSIS — M25562 Pain in left knee: Secondary | ICD-10-CM | POA: Diagnosis not present

## 2019-12-15 DIAGNOSIS — Z79891 Long term (current) use of opiate analgesic: Secondary | ICD-10-CM | POA: Diagnosis not present

## 2019-12-15 DIAGNOSIS — G894 Chronic pain syndrome: Secondary | ICD-10-CM | POA: Diagnosis not present

## 2019-12-15 DIAGNOSIS — M25512 Pain in left shoulder: Secondary | ICD-10-CM | POA: Diagnosis not present

## 2019-12-15 DIAGNOSIS — M797 Fibromyalgia: Secondary | ICD-10-CM | POA: Diagnosis not present

## 2019-12-15 DIAGNOSIS — M545 Low back pain: Secondary | ICD-10-CM | POA: Diagnosis not present

## 2019-12-15 DIAGNOSIS — M25511 Pain in right shoulder: Secondary | ICD-10-CM | POA: Diagnosis not present

## 2019-12-15 DIAGNOSIS — M170 Bilateral primary osteoarthritis of knee: Secondary | ICD-10-CM

## 2019-12-15 DIAGNOSIS — M79652 Pain in left thigh: Secondary | ICD-10-CM | POA: Diagnosis not present

## 2019-12-15 NOTE — Progress Notes (Signed)
Laura Barnes presents to clinic today for Gelsyn injection left knee 1/3  Procedure: Real-time Ultrasound Guided Injection of left knee  Device: Philips Affiniti 50G Images permanently stored and available for review in the ultrasound unit. Verbal informed consent obtained.  Discussed risks and benefits of procedure. Warned about infection bleeding damage to structures skin hypopigmentation and fat atrophy among others. Patient expresses understanding and agreement Time-out conducted.   Noted no overlying erythema, induration, or other signs of local infection.   Skin prepped in a sterile fashion.   Local anesthesia: Topical Ethyl chloride.   With sterile technique and under real time ultrasound guidance:  Gelsyn injected easily.   Completed without difficulty    Advised to call if fevers/chills, erythema, induration, drainage, or persistent bleeding.   Images permanently stored and available for review in the ultrasound unit.  Impression: Technically successful ultrasound guided injection.    Lot number: [id number]  Return in 1 week for Gelsyn injection left knee 2/3

## 2019-12-15 NOTE — Patient Instructions (Addendum)
You had a L knee injection today. Call or go to the ER if you develop a large red swollen joint with extreme pain or oozing puss.  Return in 1 week for 2nd injection

## 2019-12-17 ENCOUNTER — Encounter: Payer: Self-pay | Admitting: Internal Medicine

## 2019-12-20 ENCOUNTER — Other Ambulatory Visit: Payer: Self-pay | Admitting: Internal Medicine

## 2019-12-22 ENCOUNTER — Ambulatory Visit: Payer: Self-pay

## 2019-12-22 ENCOUNTER — Encounter: Payer: Self-pay | Admitting: Family Medicine

## 2019-12-22 ENCOUNTER — Ambulatory Visit: Payer: Medicare Other | Admitting: Family Medicine

## 2019-12-22 ENCOUNTER — Other Ambulatory Visit: Payer: Self-pay

## 2019-12-22 DIAGNOSIS — M170 Bilateral primary osteoarthritis of knee: Secondary | ICD-10-CM

## 2019-12-22 DIAGNOSIS — M5416 Radiculopathy, lumbar region: Secondary | ICD-10-CM

## 2019-12-22 DIAGNOSIS — G894 Chronic pain syndrome: Secondary | ICD-10-CM

## 2019-12-22 DIAGNOSIS — M2550 Pain in unspecified joint: Secondary | ICD-10-CM

## 2019-12-22 DIAGNOSIS — M25562 Pain in left knee: Secondary | ICD-10-CM

## 2019-12-22 DIAGNOSIS — G8929 Other chronic pain: Secondary | ICD-10-CM | POA: Diagnosis not present

## 2019-12-22 NOTE — Telephone Encounter (Signed)
New referral placed.  It will be probably a week or 2 before we get some detailed information.

## 2019-12-22 NOTE — Patient Instructions (Signed)
Thank you for coming in today. Recheck in 2 weeks for 3rd injection.

## 2019-12-22 NOTE — Progress Notes (Signed)
Patient presents to clinic today for Gelsyn injection left knee 2/3  Additionally she notes her PCP has referred her to pain management clinic for her chronic pain.  She had her first visit with [facility] and was not happy at all.    Procedure: Real-time Ultrasound Guided Injection of left knee Device: Philips Affiniti 50G Images permanently stored and available for review in the ultrasound unit. Verbal informed consent obtained.  Discussed risks and benefits of procedure. Warned about infection bleeding damage to structures skin hypopigmentation and fat atrophy among others. Patient expresses understanding and agreement Time-out conducted.   Noted no overlying erythema, induration, or other signs of local infection.   Skin prepped in a sterile fashion.   Local anesthesia: Topical Ethyl chloride.   With sterile technique and under real time ultrasound guidance:  Gelsyn injected easily.   Completed without difficulty   Advised to call if fevers/chills, erythema, induration, drainage, or persistent bleeding.   Images permanently stored and available for review in the ultrasound unit.  Impression: Technically successful ultrasound guided injection.   Lot #[id number]

## 2019-12-26 ENCOUNTER — Encounter: Payer: Medicare Other | Admitting: Physical Medicine and Rehabilitation

## 2019-12-27 ENCOUNTER — Other Ambulatory Visit: Payer: Self-pay | Admitting: Internal Medicine

## 2019-12-27 DIAGNOSIS — F419 Anxiety disorder, unspecified: Secondary | ICD-10-CM

## 2020-01-01 ENCOUNTER — Ambulatory Visit: Payer: Self-pay

## 2020-01-01 ENCOUNTER — Ambulatory Visit: Payer: Medicare Other | Admitting: Family Medicine

## 2020-01-01 ENCOUNTER — Other Ambulatory Visit: Payer: Self-pay

## 2020-01-01 DIAGNOSIS — M25562 Pain in left knee: Secondary | ICD-10-CM

## 2020-01-01 DIAGNOSIS — G8929 Other chronic pain: Secondary | ICD-10-CM | POA: Diagnosis not present

## 2020-01-01 DIAGNOSIS — M170 Bilateral primary osteoarthritis of knee: Secondary | ICD-10-CM

## 2020-01-01 NOTE — Progress Notes (Signed)
Jan presents to clinic today for Gelsyn injection left knee 3/3  Procedure: Real-time Ultrasound Guided Injection of left knee Device: Philips Affiniti 50G Images permanently stored and available for review in the ultrasound unit. Verbal informed consent obtained.  Discussed risks and benefits of procedure. Warned about infection bleeding damage to structures skin hypopigmentation and fat atrophy among others. Patient expresses understanding and agreement Time-out conducted.   Noted no overlying erythema, induration, or other signs of local infection.   Skin prepped in a sterile fashion.   Local anesthesia: Topical Ethyl chloride.   With sterile technique and under real time ultrasound guidance:  Gelsyn injected easily.   Completed without difficulty      Advised to call if fevers/chills, erythema, induration, drainage, or persistent bleeding.   Images permanently stored and available for review in the ultrasound unit.  Impression: Technically successful ultrasound guided injection.   Lot #[id number]

## 2020-01-01 NOTE — Patient Instructions (Addendum)
You had a L knee injection today.  Call or go to the ER if you develop a large red swollen joint with extreme pain or oozing puss.   Recheck with me as needed

## 2020-01-08 DIAGNOSIS — M9904 Segmental and somatic dysfunction of sacral region: Secondary | ICD-10-CM | POA: Diagnosis not present

## 2020-01-08 DIAGNOSIS — M9903 Segmental and somatic dysfunction of lumbar region: Secondary | ICD-10-CM | POA: Diagnosis not present

## 2020-01-08 DIAGNOSIS — M5136 Other intervertebral disc degeneration, lumbar region: Secondary | ICD-10-CM | POA: Diagnosis not present

## 2020-01-08 DIAGNOSIS — M9905 Segmental and somatic dysfunction of pelvic region: Secondary | ICD-10-CM | POA: Diagnosis not present

## 2020-01-09 DIAGNOSIS — M9904 Segmental and somatic dysfunction of sacral region: Secondary | ICD-10-CM | POA: Diagnosis not present

## 2020-01-09 DIAGNOSIS — M9905 Segmental and somatic dysfunction of pelvic region: Secondary | ICD-10-CM | POA: Diagnosis not present

## 2020-01-09 DIAGNOSIS — M5136 Other intervertebral disc degeneration, lumbar region: Secondary | ICD-10-CM | POA: Diagnosis not present

## 2020-01-09 DIAGNOSIS — M9903 Segmental and somatic dysfunction of lumbar region: Secondary | ICD-10-CM | POA: Diagnosis not present

## 2020-01-10 DIAGNOSIS — M9903 Segmental and somatic dysfunction of lumbar region: Secondary | ICD-10-CM | POA: Diagnosis not present

## 2020-01-10 DIAGNOSIS — M9904 Segmental and somatic dysfunction of sacral region: Secondary | ICD-10-CM | POA: Diagnosis not present

## 2020-01-10 DIAGNOSIS — M9905 Segmental and somatic dysfunction of pelvic region: Secondary | ICD-10-CM | POA: Diagnosis not present

## 2020-01-10 DIAGNOSIS — M5136 Other intervertebral disc degeneration, lumbar region: Secondary | ICD-10-CM | POA: Diagnosis not present

## 2020-01-15 DIAGNOSIS — M9904 Segmental and somatic dysfunction of sacral region: Secondary | ICD-10-CM | POA: Diagnosis not present

## 2020-01-15 DIAGNOSIS — M5136 Other intervertebral disc degeneration, lumbar region: Secondary | ICD-10-CM | POA: Diagnosis not present

## 2020-01-15 DIAGNOSIS — M9903 Segmental and somatic dysfunction of lumbar region: Secondary | ICD-10-CM | POA: Diagnosis not present

## 2020-01-15 DIAGNOSIS — M9905 Segmental and somatic dysfunction of pelvic region: Secondary | ICD-10-CM | POA: Diagnosis not present

## 2020-01-17 DIAGNOSIS — M5136 Other intervertebral disc degeneration, lumbar region: Secondary | ICD-10-CM | POA: Diagnosis not present

## 2020-01-17 DIAGNOSIS — M5126 Other intervertebral disc displacement, lumbar region: Secondary | ICD-10-CM | POA: Diagnosis not present

## 2020-01-17 DIAGNOSIS — G894 Chronic pain syndrome: Secondary | ICD-10-CM | POA: Diagnosis not present

## 2020-01-17 DIAGNOSIS — Z79891 Long term (current) use of opiate analgesic: Secondary | ICD-10-CM | POA: Diagnosis not present

## 2020-01-17 DIAGNOSIS — Z79899 Other long term (current) drug therapy: Secondary | ICD-10-CM | POA: Diagnosis not present

## 2020-01-17 DIAGNOSIS — M79605 Pain in left leg: Secondary | ICD-10-CM | POA: Diagnosis not present

## 2020-01-24 DIAGNOSIS — M5126 Other intervertebral disc displacement, lumbar region: Secondary | ICD-10-CM | POA: Diagnosis not present

## 2020-01-24 DIAGNOSIS — M5136 Other intervertebral disc degeneration, lumbar region: Secondary | ICD-10-CM | POA: Diagnosis not present

## 2020-01-24 DIAGNOSIS — M5416 Radiculopathy, lumbar region: Secondary | ICD-10-CM | POA: Diagnosis not present

## 2020-02-02 ENCOUNTER — Other Ambulatory Visit: Payer: Self-pay | Admitting: Internal Medicine

## 2020-02-08 ENCOUNTER — Other Ambulatory Visit: Payer: Self-pay

## 2020-02-08 ENCOUNTER — Encounter: Payer: Self-pay | Admitting: Family Medicine

## 2020-02-08 ENCOUNTER — Ambulatory Visit: Payer: Medicare Other | Admitting: Family Medicine

## 2020-02-08 VITALS — BP 110/76 | HR 97 | Ht 59.0 in | Wt 116.0 lb

## 2020-02-08 DIAGNOSIS — M7542 Impingement syndrome of left shoulder: Secondary | ICD-10-CM

## 2020-02-08 DIAGNOSIS — M25512 Pain in left shoulder: Secondary | ICD-10-CM | POA: Diagnosis not present

## 2020-02-08 NOTE — Progress Notes (Signed)
[place] Phone: [phone] Subjective:   Laura Barnes, am serving as a scribe for Dr. Hulan Saas. This visit occurred during the SARS-CoV-2 public health emergency.  Safety protocols were in place, including screening questions prior to the visit, additional usage of staff PPE, and extensive cleaning of exam room while observing appropriate contact time as indicated for disinfecting solutions.   I'm seeing this patient by the request  of:  Binnie Rail, MD  CC: Left shoulder pain  [id number]   03/06/2020 Left knee injected today.  Tolerated the procedure well.  Discussed icing regimen and home exercise, increase activity slowly over the course the next several weeks.  Patient has many different comorbidities and will avoid doing other things such as topical anti-inflammatories.  Likely not a surgical candidate secondary to her other comorbidities at this time.  Chronic problem with exacerbation.  Discussed multiple different pain management clinics with patient.  Patient will talk to her primary care provider.  Continue the hydromorphone.  Update 02/08/2020 Laura Barnes is a 79 y.o. female coming in with complaint of left knee pain. Finished Gelsyn visco series one month ago. Patient states that she is unsure if knee pain is any better as her back pain is causing radiating symptoms down the left leg.  Patient is seeing another provider out of pain medicine clinic getting multiple injections in her back.  Needs left shoulder injection today. Pan in posterior aspect when she lies on that shoulder.  Patient states that it needs to has started to affect daily activities such as dressing.  Does not know if it is more the neck or the shoulder itself.     Past Medical History:  Diagnosis Date  . ALLERGIC RHINITIS   . Allergy   . Anemia   . ANXIETY   . Arthritis    hands  . Bronchitis   . Chronic  fatigue fibromyalgia syndrome   . Complication of anesthesia    says one time waking up she couldn't breathe, felt like her throat closing up  . DEPRESSION   . Enthesopathy of hip region   . Family history of anesthesia complication    sister with n/v  . FOOT PAIN, BILATERAL   . GERD   . Hiatal hernia   . HYPERLIPIDEMIA   . Hypothyroidism   . IBS   . MIGRAINE HEADACHE   . MIGRAINE, COMMON   . NECK MASS   . OSTEOPENIA   . OSTEOPOROSIS   . PLANTAR FASCIITIS   . RASH-NONVESICULAR   . SINUSITIS- ACUTE-NOS   . SKIN LESION   . VAGINITIS   . VENOUS INSUFFICIENCY, CHRONIC    Past Surgical History:  Procedure Laterality Date  . APPENDECTOMY    . BREAST ENHANCEMENT SURGERY    . BREAST IMPLANT REMOVAL    . CHOLECYSTECTOMY    . COLONOSCOPY    . FOOT SURGERY  11/06/2016  . MASTECTOMY     bilateral for severe bilateral fibrocystic disease  . OOPHORECTOMY    . s/p neck lump removal    . SHOULDER SURGERY    . SINUS ENDO W/FUSION Right 06/30/2013   Procedure: RIGHT ENDOSCOPIC SPHENOIDECTOMY WITH FUSION SCAN;  Surgeon: [name], MD;  Location: [facility];  Service: ENT;  Laterality: Right;  . TONSILLECTOMY    . VAGINAL HYSTERECTOMY     Social History   Socioeconomic History  . Marital status: Widowed  Spouse name: Not on file  . Number of children: 2  . Years of education: Not on file  . Highest education level: Not on file  Occupational History  . Occupation: retired Press photographer  Tobacco Use  . Smoking status: Never Smoker  . Smokeless tobacco: Never Used  Vaping Use  . Vaping Use: Never used  Substance and Sexual Activity  . Alcohol use: Barnes  . Drug use: Barnes  . Sexual activity: Not Currently  Other Topics Concern  . Not on file  Social History Narrative   Has 2 biological children and 1 step child   Social Determinants of Health   Financial Resource Strain:   . Difficulty of Paying Living Expenses: Not on file  Food Insecurity:   . Worried About Sales executive in the Last Year: Not on file  . Ran Out of Food in the Last Year: Not on file  Transportation Needs:   . Lack of Transportation (Medical): Not on file  . Lack of Transportation (Non-Medical): Not on file  Physical Activity:   . Days of Exercise per Week: Not on file  . Minutes of Exercise per Session: Not on file  Stress:   . Feeling of Stress : Not on file  Social Connections:   . Frequency of Communication with Friends and Family: Not on file  . Frequency of Social Gatherings with Friends and Family: Not on file  . Attends Religious Services: Not on file  . Active Member of Clubs or Organizations: Not on file  . Attends Archivist Meetings: Not on file  . Marital Status: Not on file   Allergies  Allergen Reactions  . Prednisone Anaphylaxis    Patient unable to remember why she needed to steroid shot but just knows that when she got back to work she started having a lot of trouble breathing.  (jkl 05/04/14)  Multiple epidural steroid injections with depomedrol with Barnes issues. (05/04/18)  . Amoxicillin-Pot Clavulanate Hives and Diarrhea    Has patient had a PCN reaction causing immediate rash, facial/tongue/throat swelling, SOB or lightheadedness with hypotension: Unknown Has patient had a PCN reaction causing severe rash involving mucus membranes or skin necrosis: Unknown Has patient had a PCN reaction that required hospitalization: Unknown Has patient had a PCN reaction occurring within the last 10 years: Unknown If all of the above answers are "Barnes", then may proceed with Cephalosporin use.   . Aspirin Other (See Comments)    Stomach ache and bleed  . Erythromycin Diarrhea  . Levofloxacin Other (See Comments)    Headaches, GI upset  . Nsaids Other (See Comments)    stomach bleeding per pt  . Statins Nausea Only and Other (See Comments)    Headache, upset stomach, joint hurt, heartburn  . Sumatriptan Hives and Palpitations  . Adhesive [Tape] Other (See  Comments)    blisters  . Ivp Dye [Iodinated Diagnostic Agents]     If made with blue shellfish then CAN NOT have this type of dye.  Pt was given contrast 10/12/17, not premedicated, had Barnes COMPLICATIONS- BLO, CT  . Methylphenidate Hcl     Unknown reaction  . Mirtazapine     Unknown reaction  . Red Yeast Rice [Cholestin] Other (See Comments)    Pt reports causes aching pains like statins do  . Shellfish Allergy     Blue shellfish  . Soy Allergy     Unknown reaction  . Doxycycline Diarrhea  . Hydrocodone Itching  .  Hydrocodone-Acetaminophen Itching  . Latex Other (See Comments)    blisters  . Rofecoxib Nausea Only  . Sertraline Hcl Nausea Only  . Sulfonamide Derivatives Itching   Family History  Problem Relation Age of Onset  . Diabetes Mother   . Heart disease Father   . Diabetes Father   . Diabetes Sister   . Breast cancer Maternal Grandmother   . Heart disease Maternal Uncle        x 2  . Heart disease Maternal Aunt   . Kidney disease Paternal Uncle        questionable  . Heart disease Son   . Healthy Son   . Irritable bowel syndrome Son   . Colon cancer Neg Hx   . Colon polyps Neg Hx   . Rectal cancer Neg Hx   . Stomach cancer Neg Hx     Current Outpatient Medications (Endocrine & Metabolic):  .  estradiol (ESTRACE) 0.5 MG tablet, Take 1 tablet (0.5 mg total) by mouth daily. Marland Kitchen  levothyroxine (SYNTHROID) 75 MCG tablet, TAKE 1 TABLET BY MOUTH EVERY DAY IN THE MORNING  Current Outpatient Medications (Cardiovascular):  .  triamterene-hydrochlorothiazide (MAXZIDE-25) 37.5-25 MG tablet, TAKE 1 TABLET BY MOUTH DAILY  Current Outpatient Medications (Respiratory):  .  benzonatate (TESSALON) 200 MG capsule, Take 1 capsule (200 mg total) by mouth 3 (three) times daily as needed for cough. .  fluticasone (FLONASE) 50 MCG/ACT nasal spray, Place 2 sprays into both nostrils daily.  Current Outpatient Medications (Analgesics):  .  acetaminophen (TYLENOL) 500 MG tablet, Take  1,000 mg by mouth every 6 (six) hours as needed for mild pain, moderate pain, fever or headache.   Current Outpatient Medications (Other):  Marland Kitchen  ALPRAZolam (XANAX) 0.5 MG tablet, TAKE 1 TABLET(0.5 MG) BY MOUTH THREE TIMES DAILY AS NEEDED FOR ANXIETY .  Amino Acids (AMINO ACID PO), Take 1 capsule by mouth 2 (two) times daily. .  Ascorbic Acid (VITAMIN C) 1000 MG tablet, Take 1,000 mg by mouth 2 (two) times daily. .  B Complex-Biotin-FA (B COMPLETE) TABS, Take 1 tablet by mouth daily. B Complete 100mg  .  buPROPion (WELLBUTRIN XL) 150 MG 24 hr tablet, TAKE 1 TABLET(150 MG) BY MOUTH EVERY MORNING .  buPROPion (WELLBUTRIN XL) 300 MG 24 hr tablet, Take 1 tablet (300 mg total) by mouth daily. For total of 450 mg daily .  Cholecalciferol (VITAMIN D3) 1000 units CAPS, Take 1,000 Units by mouth daily. .  Coenzyme Q10 (CO Q-10) 200 MG CAPS, Take 1 capsule by mouth 2 (two) times daily. Marland Kitchen  ECHINACEA PO, Take 1 tablet by mouth 2 (two) times daily. Marland Kitchen  ELDERBERRY PO, Take by mouth daily. Marland Kitchen  GOLDEN SEAL PO, Take by mouth daily. Nyoka Cowden Tea, Camellia sinensis, (EGCG) POWD, Take 1 capsule by mouth daily. .  Homeopathic Products (ARNICA [place]) PLLT, Take by mouth daily. Marland Kitchen  KRILL OIL PO, Take by mouth daily. Marland Kitchen  LECITHIN PO, Take 1 tablet by mouth daily. Marland Kitchen  MAGNESIUM ASPARTATE PO, Take 1 tablet by mouth 2 (two) times daily. .  meclizine (ANTIVERT) 25 MG tablet, Take 1 tablet (25 mg total) by mouth 3 (three) times daily as needed for dizziness. .  Misc. Devices (ROLLATOR) MISC, Use rollator for ambulation .  Multiple Vitamins-Minerals (ZINC PO), Take by mouth daily. .  ondansetron (ZOFRAN) 4 MG tablet, TAKE 1 TABLET(4 MG) BY MOUTH EVERY 8 HOURS AS NEEDED FOR NAUSEA OR VOMITING .  ondansetron (ZOFRAN-ODT) 8 MG disintegrating tablet,  DISSOLVE 1 TABLET(8 MG) ON THE TONGUE EVERY 8 HOURS AS NEEDED FOR NAUSEA OR VOMITING .  POTASSIUM AMINOBENZOATE PO, Take 1 tablet by mouth daily. .  prochlorperazine (COMPAZINE) 10 MG  tablet, Take 1 tablet (10 mg total) by mouth every 6 (six) hours as needed for nausea or vomiting. .  TURMERIC PO, Take 1 tablet by mouth 2 (two) times daily.  .  vitamin E 400 UNIT capsule, Take 400 Units by mouth daily.   Reviewed prior external information including notes and imaging from  primary care provider As well as notes that were available from care everywhere and other healthcare systems.  Past medical history, social, surgical and family history all reviewed in electronic medical record.  Barnes pertanent information unless stated regarding to the chief complaint.   Review of Systems:  Barnes headache, visual changes, nausea, vomiting, diarrhea, constipation, dizziness, abdominal pain, skin rash, fevers, chills, night sweats, weight loss, swollen lymph nodes, body aches, joint swelling, chest pain, shortness of breath, mood changes. POSITIVE muscle aches  Objective  Blood pressure 110/76, pulse 97, height 4\' 11"  (1.499 m), weight 116 lb (52.6 kg), SpO2 98 %.   General: Barnes apparent distress alert and oriented x3 mood and affect normal, dressed appropriately.  HEENT: Pupils equal, extraocular movements intact  Respiratory: Patient's speak in full sentences and does not appear short of breath  Cardiovascular: Barnes lower extremity edema, non tender, Barnes erythema  MSK: Patient's left shoulder does have some positive impingement with Hawkins and Neer's.  Pain is out of proportion to the amount of palpation.  Patient is now 20 to get out of the chair at this moment to check her back significantly.  After informed written and verbal consent, patient was seated on exam table. Left shoulder was prepped with alcohol swab and utilizing posterior approach, patient's right glenohumeral space was injected with 4:1  marcaine 0.5%: Kenalog 40mg /dL. Patient tolerated the procedure well without immediate complications.    Impression and Recommendations:     The above documentation has been reviewed and  is accurate and complete Laura Pulley, DO       Note: This dictation was prepared with Dragon dictation along with smaller phrase technology. Any transcriptional errors that result from this process are unintentional.

## 2020-02-08 NOTE — Assessment & Plan Note (Signed)
Patient has left shoulder pain.  Describes it as a dull, throbbing aching sensation.  Patient given injection today and hoping that this will be beneficial with patient having bursitis previously greater than 5 years ago.  Encouraged x-rays, No significant improvement can consider possibly treating of the neck but with patient with multiple different allergies and multiple different providers I be scared to make significant changes in medications at this time.  Differential includes patient fibromyalgia and chronic pain syndrome as well.  Discussed following up with her primary care, or one of the other provider she is seen for pain in the near future.

## 2020-02-08 NOTE — Assessment & Plan Note (Signed)
Injection given, has had this pain intermittently.  Encouraged the new x-rays.  Patient has had some noncompliance with this over the course of time and has difficulty with treatment secondary to patient's allergies and other comorbidities over the injection was safer.  Warned potential side effects and patient understands has had many different type of injections in multiple different joints over the course of years.  Follow-up again 2 to 3 months

## 2020-02-08 NOTE — Patient Instructions (Signed)
Left shoulder xray today See me or Dr. Georgina Snell in 2 months

## 2020-02-10 IMAGING — XA Imaging study
2 series · 2 of 2 positions shown · non-contrast
Comparison: none

CLINICAL DATA: Spondylosis without myelopathy. Right posterolateral
disc herniation at L2-2 3 with caudal migration. Back pain. Right
hip and leg pain.

[Series 1: ortho standard · 1 of 1 slices shown (1 of 2)]
[im 1/1]
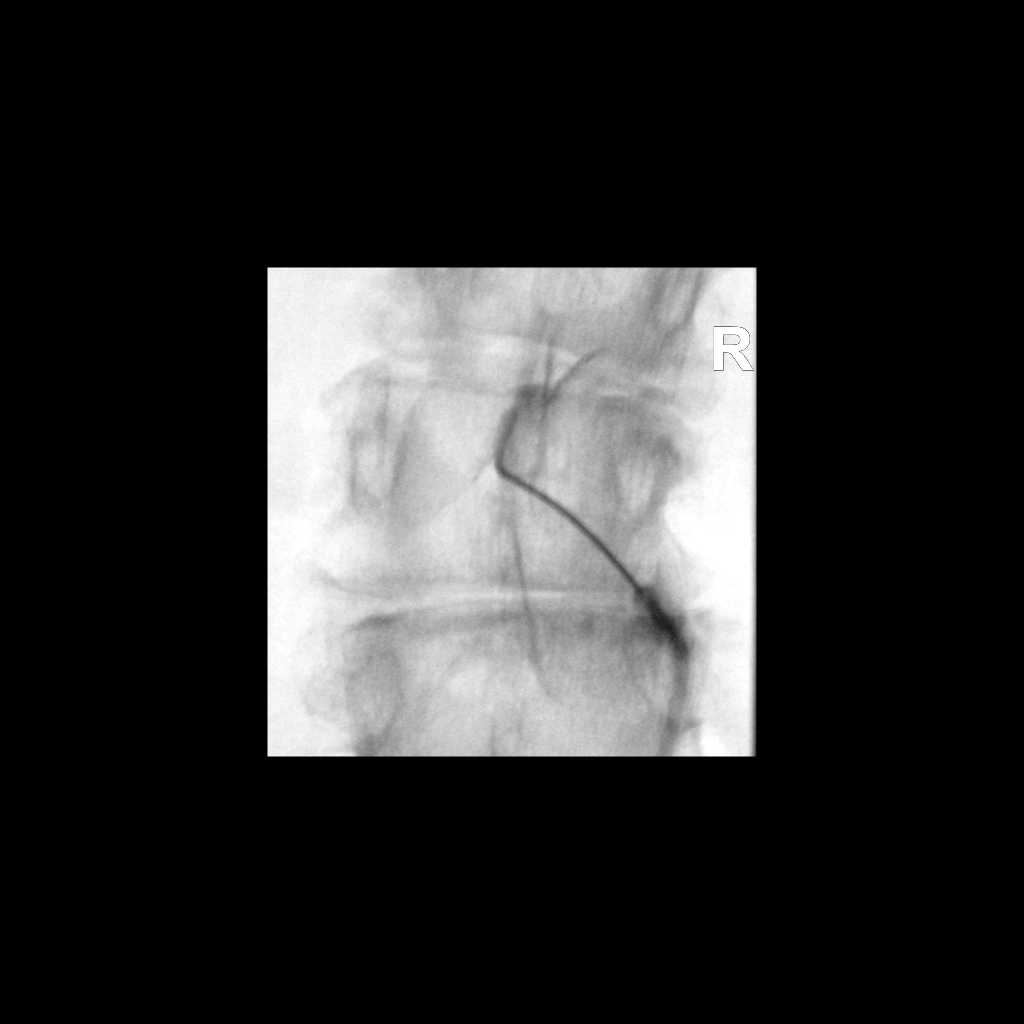

[Series 2: ortho standard · 1 of 1 slices shown (2 of 2)]
[im 1/1]
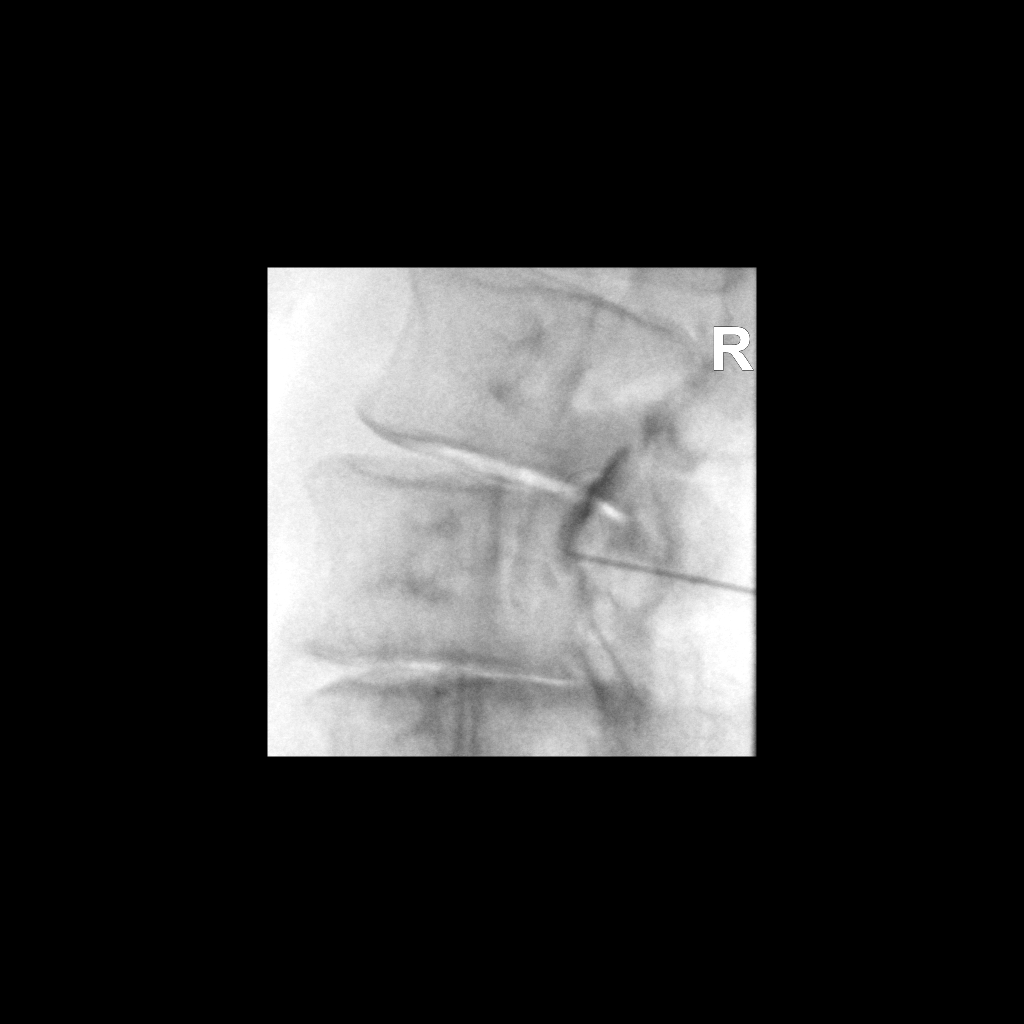

[2 of 2 positions shown; findings below may reference images not displayed]

FLUOROSCOPY TIME:  0 minutes 55 seconds. 69.79 micro gray meter
squared

PROCEDURE:
The procedure, risks, benefits, and alternatives were explained to
the patient. Questions regarding the procedure were encouraged and
answered. The patient understands and consents to the procedure.

LUMBAR EPIDURAL INJECTION:

An interlaminar approach was performed on the right at L2-3. The
overlying skin was cleansed and anesthetized. A 20 gauge epidural
needle was advanced using loss-of-resistance technique.

DIAGNOSTIC EPIDURAL INJECTION:

Injection of Isovue-M 200 shows a good epidural pattern with spread
above and below the level of needle placement, primarily on the
right, but to both sides. No vascular opacification is seen.

THERAPEUTIC EPIDURAL INJECTION:

One hundred twenty mg of Depo-Medrol mixed with 2 cc 1% lidocaine
were instilled. The procedure was well-tolerated, and the patient
was discharged thirty minutes following the injection in good
condition.

COMPLICATIONS:
None
IMPRESSION: Technically successful epidural injection on the right at L2-3 # 1

## 2020-02-14 DIAGNOSIS — M5416 Radiculopathy, lumbar region: Secondary | ICD-10-CM | POA: Diagnosis not present

## 2020-02-14 DIAGNOSIS — G894 Chronic pain syndrome: Secondary | ICD-10-CM | POA: Diagnosis not present

## 2020-02-14 DIAGNOSIS — M5126 Other intervertebral disc displacement, lumbar region: Secondary | ICD-10-CM | POA: Diagnosis not present

## 2020-02-14 DIAGNOSIS — Z79899 Other long term (current) drug therapy: Secondary | ICD-10-CM | POA: Diagnosis not present

## 2020-02-14 DIAGNOSIS — M5136 Other intervertebral disc degeneration, lumbar region: Secondary | ICD-10-CM | POA: Diagnosis not present

## 2020-02-14 DIAGNOSIS — Z79891 Long term (current) use of opiate analgesic: Secondary | ICD-10-CM | POA: Diagnosis not present

## 2020-02-20 ENCOUNTER — Encounter: Payer: Self-pay | Admitting: Internal Medicine

## 2020-02-20 MED ORDER — BUPROPION HCL ER (XL) 150 MG PO TB24
ORAL_TABLET | ORAL | 1 refills | Status: DC
Start: 2020-02-20 — End: 2020-12-24

## 2020-02-26 ENCOUNTER — Other Ambulatory Visit: Payer: Self-pay | Admitting: Internal Medicine

## 2020-02-26 DIAGNOSIS — M5416 Radiculopathy, lumbar region: Secondary | ICD-10-CM | POA: Diagnosis not present

## 2020-02-26 DIAGNOSIS — M5126 Other intervertebral disc displacement, lumbar region: Secondary | ICD-10-CM | POA: Diagnosis not present

## 2020-02-26 DIAGNOSIS — M5136 Other intervertebral disc degeneration, lumbar region: Secondary | ICD-10-CM | POA: Diagnosis not present

## 2020-02-26 DIAGNOSIS — F419 Anxiety disorder, unspecified: Secondary | ICD-10-CM

## 2020-03-01 NOTE — Progress Notes (Signed)
Office Visit Note  Patient: Laura Barnes             Date of Birth: 1940-11-26           MRN: [id number]             PCP: Binnie Rail, MD Referring: Binnie Rail, MD Visit Date: 03/14/2020 Occupation: @GUAROCC @  Subjective:  Pain in multiple joints.   History of Present Illness: Laura Barnes is a 79 y.o. female history of osteoarthritis, degenerative disc disease and fibromyalgia.  She continues to have pain and discomfort in her hands.  She states she has noticed the grip strength is not as good.  She also has lot of discomfort in her neck and lower back for which she goes to pain management.  She states the back pain radiates into her left knee which causes soreness as well.  Fibromyalgia symptoms are fairly well controlled.  She states she gets frequent cortisone injections for her neck and back to her pain management doctor.  She has been getting DEXA scan through her PCP.  Activities of Daily Living:  Patient reports morning stiffness for 0 minutes.   Patient Denies nocturnal pain.  Difficulty dressing/grooming: Denies Difficulty climbing stairs: Denies Difficulty getting out of chair: Denies Difficulty using hands for taps, buttons, cutlery, and/or writing: Denies  Review of Systems  Constitutional: Positive for fatigue.  HENT: Negative for mouth sores, mouth dryness and nose dryness.   Eyes: Negative for pain, itching and dryness.  Respiratory: Positive for difficulty breathing. Negative for shortness of breath.   Cardiovascular: Positive for swelling in legs/feet. Negative for chest pain and palpitations.  Gastrointestinal: Positive for constipation and diarrhea. Negative for blood in stool.  Endocrine: Negative for increased urination.  Genitourinary: Negative for difficulty urinating and painful urination.  Musculoskeletal: Positive for arthralgias, joint pain, myalgias, muscle tenderness and myalgias. Negative for joint swelling and morning stiffness.    Skin: Negative for color change, rash and hair loss.  Allergic/Immunologic: Negative for susceptible to infections.  Neurological: Positive for dizziness and headaches. Negative for numbness, memory loss and weakness.  Hematological: Positive for bruising/bleeding tendency.  Psychiatric/Behavioral: Negative for confusion.    PMFS History:  Patient Active Problem List   Diagnosis Date Noted  . 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 Scoliosis of thoracolumbar spine 07/11/2019  . Choking 03/24/2019  . Hypokalemia 03/24/2019  . Fatigue 12/31/2018  . Leg swelling 09/14/2018  . Degenerative arthritis of knee, bilateral 07/05/2018  . Protrusion of lumbar intervertebral disc 04/20/2018  . Difficulty urinating 11/24/2017  . Dizziness 08/11/2017  . Sacroiliac pain 06/17/2017  . Greater trochanteric bursitis of right hip 05/27/2017  . Vertigo 04/29/2017  . Dysphagia 10/28/2016  . Lump of skin 05/11/2016  . 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 Chronic meniscal tear of knee 03/05/2016  . Nausea 03/04/2016  . Osteopenia 12/03/2015  . Thyroid nodule 11/16/2015  . Bilateral leg edema 11/05/2015  . Anterior neck pain 11/05/2015  . Hormone replacement therapy (HRT) 11/05/2015  . Subacromial bursitis 09/16/2015  . 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 Trochanteric bursitis of both hips 03/12/2015  . Subacromial impingement of left shoulder 03/12/2015  . Hyperlipidemia 05/01/2014  . Chronic pain syndrome 05/01/2014  . Lumbar radiculopathy 03/27/2014  . Left shoulder pain 11/07/2013  . Degeneration of intervertebral disc of cervical region 10/10/2013  . Fibromyalgia 09/06/2013  . Bilateral shoulder pain 09/06/2013  . Chronic cough  04/12/2013  . Sinusitis, chronic 04/12/2013  . Hypothyroidism 04/07/2012  . Chronic venous insufficiency 04/02/2010  . Enthesopathy of hip region 07/19/2007  . Osteoporosis 07/19/2007  . Anxiety  01/23/2007  . Depression 01/23/2007  . Migraine 01/23/2007  . NINAR (noninfectious nonallergic rhinitis) 01/23/2007  . Gastroesophageal reflux disease 01/23/2007  . IBS 01/23/2007    Past Medical History:  Diagnosis Date  . ALLERGIC RHINITIS   . Allergy   . Anemia   . ANXIETY   . Arthritis    hands  . Bronchitis   . Chronic fatigue fibromyalgia syndrome   . Complication of anesthesia    says one time waking up she couldn't breathe, felt like her throat closing up  . DEPRESSION   . Enthesopathy of hip region   . Family history of anesthesia complication    sister with n/v  . FOOT PAIN, BILATERAL   . GERD   . Hiatal hernia   . HYPERLIPIDEMIA   . Hypothyroidism   . IBS   . MIGRAINE HEADACHE   . MIGRAINE, COMMON   . NECK MASS   . OSTEOPENIA   . OSTEOPOROSIS   . PLANTAR FASCIITIS   . RASH-NONVESICULAR   . SINUSITIS- ACUTE-NOS   . SKIN LESION   . VAGINITIS   . VENOUS INSUFFICIENCY, CHRONIC     Family History  Problem Relation Age of Onset  . Diabetes Mother   . Heart disease Father   . Diabetes Father   . Diabetes Sister   . Breast cancer Maternal Grandmother   . Heart disease Maternal Uncle        x 2  . Heart disease Maternal Aunt   . Kidney disease Paternal Uncle        questionable  . Heart disease Son   . Healthy Son   . Irritable bowel syndrome Son   . Colon cancer Neg Hx   . Colon polyps Neg Hx   . Rectal cancer Neg Hx   . Stomach cancer Neg Hx    Past Surgical History:  Procedure Laterality Date  . APPENDECTOMY    . BREAST ENHANCEMENT SURGERY    . BREAST IMPLANT REMOVAL    . CHOLECYSTECTOMY    . COLONOSCOPY    . FOOT SURGERY  11/06/2016  . MASTECTOMY     bilateral for severe bilateral fibrocystic disease  . OOPHORECTOMY    . s/p neck lump removal    . SHOULDER SURGERY    . SINUS ENDO W/FUSION Right 06/30/2013   Procedure: RIGHT ENDOSCOPIC SPHENOIDECTOMY WITH FUSION SCAN;  Surgeon: [name], MD;  Location: [facility];  Service: ENT;   Laterality: Right;  . TONSILLECTOMY    . VAGINAL HYSTERECTOMY     Social History   Social History Narrative   Has 2 biological children and 1 step child   Immunization History  Administered Date(s) Administered  . Fluad Quad(high Dose 65+) 01/18/2019  . Influenza Split 04/07/2011, 03/08/2012  . Influenza Whole 03/20/2008, 04/01/2009, 04/02/2010  . Influenza, High Dose Seasonal PF 04/12/2013, 03/26/2015, 03/04/2016, 04/01/2017  . Influenza,inj,Quad PF,6+ Mos 05/01/2014  . Influenza-Unspecified 04/01/2018  . Pneumococcal Conjugate-13 05/02/2013  . Pneumococcal Polysaccharide-23 01/25/2006  . Td 09/17/2008  . Tdap 03/24/2019  . Zoster 01/25/2006  . Zoster Recombinat (Shingrix) 08/18/2017, 12/06/2017     Objective: Vital Signs: BP 123/73 (BP Location: Left Arm, Patient Position: Sitting, Cuff Size: Normal)   Pulse 92   Resp 15   Ht 4' 11.75" (1.518 m)   Wt  114 lb 9.6 oz (52 kg)   BMI 22.57 kg/m    Physical Exam Vitals and nursing note reviewed.  Constitutional:      Appearance: She is well-developed.  HENT:     Head: Normocephalic and atraumatic.  Eyes:     Conjunctiva/sclera: Conjunctivae normal.  Cardiovascular:     Rate and Rhythm: Normal rate and regular rhythm.     Heart sounds: Normal heart sounds.  Pulmonary:     Effort: Pulmonary effort is normal.     Breath sounds: Normal breath sounds.  Abdominal:     General: Bowel sounds are normal.     Palpations: Abdomen is soft.  Musculoskeletal:     Cervical back: Normal range of motion.  Lymphadenopathy:     Cervical: No cervical adenopathy.  Skin:    General: Skin is warm and dry.     Capillary Refill: Capillary refill takes less than 2 seconds.  Neurological:     Mental Status: She is alert and oriented to person, place, and time.  Psychiatric:        Behavior: Behavior normal.      Musculoskeletal Exam: She has limited range of motion of the cervical spine.  She had no tenderness over thoracic or lumbar  spine.  Shoulder joints, elbow joints, wrist joints with good range of motion.  She has bilateral PIP and DIP thickening.  Hip joints, knee joints, ankles, MTPs and PIPs with good range of motion with no synovitis.  She has DIP and PIP thickening in her feet consistent with osteoarthritis.  CDAI Exam: CDAI Score: -- Patient Global: --; Provider Global: -- Swollen: --; Tender: -- Joint Exam 03/14/2020   No joint exam has been documented for this visit   There is currently no information documented on the homunculus. Go to the Rheumatology activity and complete the homunculus joint exam.  Investigation: No additional findings.  Imaging: No results found.  Recent Labs: Lab Results  Component Value Date   WBC 8.1 03/24/2019   HGB 13.2 03/24/2019   PLT 225.0 03/24/2019   NA 137 03/24/2019   K 3.5 03/24/2019   CL 99 03/24/2019   CO2 28 03/24/2019   GLUCOSE 90 03/24/2019   BUN 12 03/24/2019   CREATININE 0.90 03/24/2019   BILITOT 0.4 12/30/2018   ALKPHOS 44 12/30/2018   AST 23 12/30/2018   ALT 18 12/30/2018   PROT 7.7 12/30/2018   ALBUMIN 4.4 12/30/2018   CALCIUM 10.4 03/24/2019   GFRAA >60 04/23/2017    Speciality Comments: No specialty comments available.  Procedures:  No procedures performed Allergies: Prednisone, Amoxicillin-pot clavulanate, Aspirin, Erythromycin, Levofloxacin, Nsaids, Statins, Sumatriptan, Adhesive [tape], Ivp dye [iodinated diagnostic agents], Methylphenidate hcl, Mirtazapine, Oxycodone, Red yeast rice [cholestin], Shellfish allergy, Soy allergy, Doxycycline, Hydrocodone, Hydrocodone-acetaminophen, Latex, Rofecoxib, Sertraline hcl, and Sulfonamide derivatives   Assessment / Plan:     Visit Diagnoses: Primary osteoarthritis of both hands-she complains of pain and discomfort in her bilateral hands.  She also complains of decreased grip strength.  No synovitis was noted.  Have given her a handout on hand exercises.  Subacromial impingement of left  shoulder-she had good range of motion today without discomfort.  Trochanteric bursitis of both hips-IT band exercises were emphasized.  DDD (degenerative disc disease), cervical-she has been followed at pain clinic.  DDD (degenerative disc disease), lumbar - She had an epidural injection on 09/06/19 performed by Dr. Ernestina Patches, which resolved the right sided radiculopathy she was experiencing.  Fibromyalgia-she continues to have some generalized  pain and discomfort.  She states her fibromyalgia symptoms are controlled.  Chronic pain syndrome-followed by pain management.  Piriformis syndrome of left side-improved.  Osteopenia of multiple sites - DEXA on 03/15/18: RFN -1.2, LFN -1.2, and lumbar spine -1.2.  Patient will get repeat DEXA by Dr. Quay Burow.  We will discuss results at the follow-up visit.  Chronic venous insufficiency  Anxiety and depression  History of gastroesophageal reflux (GERD)  Thyroid nodule  Hormone replacement therapy (HRT)  History of IBS  Hypothyroidism, unspecified type  History of hyperlipidemia  Educated about COVID-19 virus infection-she does not want to get COVID-19 vaccine.  She is concerned that she has multiple allergies.  Risk of not getting vaccination was discussed.  Use of mask, social distancing and hand hygiene was discussed.  Use of monoclonal antibodies were discussed in case she develops COVID-19 infection.  Orders: No orders of the defined types were placed in this encounter.  No orders of the defined types were placed in this encounter.     Follow-Up Instructions: Return in about 1 year (around 03/14/2021) for Osteoarthritis.   Bo Merino, MD  Note - This record has been created using Editor, commissioning.  Chart creation errors have been sought, but may not always  have been located. Such creation errors do not reflect on  the standard of medical care.

## 2020-03-14 ENCOUNTER — Encounter: Payer: Self-pay | Admitting: Rheumatology

## 2020-03-14 ENCOUNTER — Other Ambulatory Visit: Payer: Self-pay

## 2020-03-14 ENCOUNTER — Ambulatory Visit: Payer: Medicare Other | Admitting: Rheumatology

## 2020-03-14 VITALS — BP 123/73 | HR 92 | Resp 15 | Ht 59.75 in | Wt 114.6 lb

## 2020-03-14 DIAGNOSIS — E039 Hypothyroidism, unspecified: Secondary | ICD-10-CM

## 2020-03-14 DIAGNOSIS — M7542 Impingement syndrome of left shoulder: Secondary | ICD-10-CM

## 2020-03-14 DIAGNOSIS — M5136 Other intervertebral disc degeneration, lumbar region: Secondary | ICD-10-CM

## 2020-03-14 DIAGNOSIS — F32A Depression, unspecified: Secondary | ICD-10-CM

## 2020-03-14 DIAGNOSIS — G5702 Lesion of sciatic nerve, left lower limb: Secondary | ICD-10-CM

## 2020-03-14 DIAGNOSIS — G894 Chronic pain syndrome: Secondary | ICD-10-CM

## 2020-03-14 DIAGNOSIS — F419 Anxiety disorder, unspecified: Secondary | ICD-10-CM

## 2020-03-14 DIAGNOSIS — Z7189 Other specified counseling: Secondary | ICD-10-CM

## 2020-03-14 DIAGNOSIS — M19041 Primary osteoarthritis, right hand: Secondary | ICD-10-CM

## 2020-03-14 DIAGNOSIS — M5030 Other cervical disc degeneration, unspecified cervical region: Secondary | ICD-10-CM | POA: Diagnosis not present

## 2020-03-14 DIAGNOSIS — M7062 Trochanteric bursitis, left hip: Secondary | ICD-10-CM

## 2020-03-14 DIAGNOSIS — M8589 Other specified disorders of bone density and structure, multiple sites: Secondary | ICD-10-CM

## 2020-03-14 DIAGNOSIS — M797 Fibromyalgia: Secondary | ICD-10-CM

## 2020-03-14 DIAGNOSIS — Z8639 Personal history of other endocrine, nutritional and metabolic disease: Secondary | ICD-10-CM

## 2020-03-14 DIAGNOSIS — M7061 Trochanteric bursitis, right hip: Secondary | ICD-10-CM

## 2020-03-14 DIAGNOSIS — Z79890 Hormone replacement therapy (postmenopausal): Secondary | ICD-10-CM

## 2020-03-14 DIAGNOSIS — M19042 Primary osteoarthritis, left hand: Secondary | ICD-10-CM

## 2020-03-14 DIAGNOSIS — E041 Nontoxic single thyroid nodule: Secondary | ICD-10-CM

## 2020-03-14 DIAGNOSIS — I872 Venous insufficiency (chronic) (peripheral): Secondary | ICD-10-CM

## 2020-03-14 DIAGNOSIS — Z8719 Personal history of other diseases of the digestive system: Secondary | ICD-10-CM

## 2020-03-14 NOTE — Patient Instructions (Signed)
Hand Exercises Hand exercises can be helpful for almost anyone. These exercises can strengthen the hands, improve flexibility and movement, and increase blood flow to the hands. These results can make work and daily tasks easier. Hand exercises can be especially helpful for people who have joint pain from arthritis or have nerve damage from overuse (carpal tunnel syndrome). These exercises can also help people who have injured a hand. Exercises Most of these hand exercises are gentle stretching and motion exercises. It is usually safe to do them often throughout the day. Warming up your hands before exercise may help to reduce stiffness. You can do this with gentle massage or by placing your hands in warm water for 10-15 minutes. It is normal to feel some stretching, pulling, tightness, or mild discomfort as you begin new exercises. This will gradually improve. Stop an exercise right away if you feel sudden, severe pain or your pain gets worse. Ask your health care provider which exercises are best for you. Knuckle bend or "claw" fist 1. Stand or sit with your arm, hand, and all five fingers pointed straight up. Make sure to keep your wrist straight during the exercise. 2. Gently bend your fingers down toward your palm until the tips of your fingers are touching the top of your palm. Keep your big knuckle straight and just bend the small knuckles in your fingers. 3. Hold this position for __________ seconds. 4. Straighten (extend) your fingers back to the starting position. Repeat this exercise 5-10 times with each hand. Full finger fist 1. Stand or sit with your arm, hand, and all five fingers pointed straight up. Make sure to keep your wrist straight during the exercise. 2. Gently bend your fingers into your palm until the tips of your fingers are touching the middle of your palm. 3. Hold this position for __________ seconds. 4. Extend your fingers back to the starting position, stretching every  joint fully. Repeat this exercise 5-10 times with each hand. Straight fist 1. Stand or sit with your arm, hand, and all five fingers pointed straight up. Make sure to keep your wrist straight during the exercise. 2. Gently bend your fingers at the big knuckle, where your fingers meet your hand, and the middle knuckle. Keep the knuckle at the tips of your fingers straight and try to touch the bottom of your palm. 3. Hold this position for __________ seconds. 4. Extend your fingers back to the starting position, stretching every joint fully. Repeat this exercise 5-10 times with each hand. Tabletop 1. Stand or sit with your arm, hand, and all five fingers pointed straight up. Make sure to keep your wrist straight during the exercise. 2. Gently bend your fingers at the big knuckle, where your fingers meet your hand, as far down as you can while keeping the small knuckles in your fingers straight. Think of forming a tabletop with your fingers. 3. Hold this position for __________ seconds. 4. Extend your fingers back to the starting position, stretching every joint fully. Repeat this exercise 5-10 times with each hand. Finger spread 1. Place your hand flat on a table with your palm facing down. Make sure your wrist stays straight as you do this exercise. 2. Spread your fingers and thumb apart from each other as far as you can until you feel a gentle stretch. Hold this position for __________ seconds. 3. Bring your fingers and thumb tight together again. Hold this position for __________ seconds. Repeat this exercise 5-10 times with each hand.   Making circles 1. Stand or sit with your arm, hand, and all five fingers pointed straight up. Make sure to keep your wrist straight during the exercise. 2. Make a circle by touching the tip of your thumb to the tip of your index finger. 3. Hold for __________ seconds. Then open your hand wide. 4. Repeat this motion with your thumb and each finger on your  hand. Repeat this exercise 5-10 times with each hand. Thumb motion 1. Sit with your forearm resting on a table and your wrist straight. Your thumb should be facing up toward the ceiling. Keep your fingers relaxed as you move your thumb. 2. Lift your thumb up as high as you can toward the ceiling. Hold for __________ seconds. 3. Bend your thumb across your palm as far as you can, reaching the tip of your thumb for the small finger (pinkie) side of your palm. Hold for __________ seconds. Repeat this exercise 5-10 times with each hand. Grip strengthening  1. Hold a stress ball or other soft ball in the middle of your hand. 2. Slowly increase the pressure, squeezing the ball as much as you can without causing pain. Think of bringing the tips of your fingers into the middle of your palm. All of your finger joints should bend when doing this exercise. 3. Hold your squeeze for __________ seconds, then relax. Repeat this exercise 5-10 times with each hand. Contact a health care provider if:  Your hand pain or discomfort gets much worse when you do an exercise.  Your hand pain or discomfort does not improve within 2 hours after you exercise. If you have any of these problems, stop doing these exercises right away. Do not do them again unless your health care provider says that you can. Get help right away if:  You develop sudden, severe hand pain or swelling. If this happens, stop doing these exercises right away. Do not do them again unless your health care provider says that you can. This information is not intended to replace advice given to you by your health care provider. Make sure you discuss any questions you have with your health care provider. Document Revised: 09/08/2018 Document Reviewed: 05/19/2018 [employer] Patient Education  2020 [employer]  

## 2020-03-28 NOTE — Progress Notes (Signed)
Subjective:    Patient ID: Laura Barnes, female    DOB: 06-Jul-1940, 79 y.o.   MRN: [id number]  HPI The patient is here for follow up of their chronic medical problems, including hypothyroidism, fibromyalgia, leg swelling, HRT, anxiety, depression  She is taking all of her medications as prescribed.    She still has significant pain in her back and leg.  She gets injections every 31 days.   Medications and allergies reviewed with patient and updated if appropriate.  Patient Active Problem List   Diagnosis Date Noted  . Aortic atherosclerosis ([place]) 03/29/2020  . 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 Scoliosis of thoracolumbar spine 07/11/2019  . Choking 03/24/2019  . Hypokalemia 03/24/2019  . Fatigue 12/31/2018  . Leg swelling 09/14/2018  . Degenerative arthritis of knee, bilateral 07/05/2018  . Protrusion of lumbar intervertebral disc 04/20/2018  . Difficulty urinating 11/24/2017  . Dizziness 08/11/2017  . Sacroiliac pain 06/17/2017  . Greater trochanteric bursitis of right hip 05/27/2017  . Vertigo 04/29/2017  . Dysphagia 10/28/2016  . Lump of skin 05/11/2016  . 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 Chronic meniscal tear of knee 03/05/2016  . Nausea 03/04/2016  . Osteopenia 12/03/2015  . Thyroid nodule 11/16/2015  . Bilateral leg edema 11/05/2015  . Anterior neck pain 11/05/2015  . Hormone replacement therapy (HRT) 11/05/2015  . Subacromial bursitis 09/16/2015  . 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 Trochanteric bursitis of both hips 03/12/2015  . Subacromial impingement of left shoulder 03/12/2015  . Hyperlipidemia 05/01/2014  . Chronic pain syndrome 05/01/2014  . Lumbar radiculopathy 03/27/2014  . Left shoulder pain 11/07/2013  . Degeneration of intervertebral disc of cervical region 10/10/2013  . Fibromyalgia 09/06/2013  . Bilateral shoulder pain 09/06/2013  . Chronic cough 04/12/2013  .  Sinusitis, chronic 04/12/2013  . Hypothyroidism 04/07/2012  . Chronic venous insufficiency 04/02/2010  . Enthesopathy of hip region 07/19/2007  . Osteoporosis 07/19/2007  . Anxiety 01/23/2007  . Depression 01/23/2007  . Migraine 01/23/2007  . NINAR (noninfectious nonallergic rhinitis) 01/23/2007  . Gastroesophageal reflux disease 01/23/2007  . IBS 01/23/2007    Current Outpatient Medications on File Prior to Visit  Medication Sig Dispense Refill  . acetaminophen (TYLENOL) 500 MG tablet Take 1,000 mg by mouth every 6 (six) hours as needed for mild pain, moderate pain, fever or headache.    . ALPRAZolam (XANAX) 0.5 MG tablet TAKE 1 TABLET(0.5 MG) BY MOUTH THREE TIMES DAILY AS NEEDED FOR ANXIETY 90 tablet 0  . Amino Acids (AMINO ACID PO) Take 1 capsule by mouth 2 (two) times daily.    . Ascorbic Acid (VITAMIN C) 1000 MG tablet Take 1,000 mg by mouth 2 (two) times daily.    . B Complex-Biotin-FA (B COMPLETE) TABS Take 1 tablet by mouth daily. B Complete 100mg     . benzonatate (TESSALON) 200 MG capsule Take 1 capsule (200 mg total) by mouth 3 (three) times daily as needed for cough. 90 capsule 5  . buPROPion (WELLBUTRIN XL) 150 MG 24 hr tablet TAKE 1 TABLET(150 MG) BY MOUTH EVERY MORNING.  FOR TOTAL OF 450 MG (Patient taking differently: TAKE 1 TABLET(150 MG) BY MOUTH EVERY MORNING AS NEEDED  FOR TOTAL OF 450 MG) 90 tablet 1  . buPROPion (WELLBUTRIN XL) 300 MG 24 hr tablet Take 1 tablet (300 mg total) by mouth daily. For total of 450 mg daily 90 tablet 1  . Cholecalciferol (VITAMIN D3) 1000 units  CAPS Take 1,000 Units by mouth daily.    . Coenzyme Q10 (CO Q-10) 200 MG CAPS Take 1 capsule by mouth 2 (two) times daily.    Marland Kitchen ECHINACEA PO Take 1 tablet by mouth 2 (two) times daily.    Marland Kitchen ELDERBERRY PO Take by mouth daily.    Marland Kitchen estradiol (ESTRACE) 0.5 MG tablet Take 1 tablet (0.5 mg total) by mouth daily. 90 tablet 1  . fluticasone (FLONASE) 50 MCG/ACT nasal spray Place 2 sprays into both nostrils  as needed.     Nyoka Cowden Tea, Camellia sinensis, (EGCG) POWD Take 1 capsule by mouth daily.    . Homeopathic Products (ARNICA [place]) PLLT Take by mouth daily.    Marland Kitchen KRILL OIL PO Take by mouth daily.    Marland Kitchen LECITHIN PO Take 1 tablet by mouth daily.    Marland Kitchen levothyroxine (SYNTHROID) 75 MCG tablet TAKE 1 TABLET BY MOUTH EVERY DAY IN THE MORNING 90 tablet 1  . MAGNESIUM ASPARTATE PO Take 1 tablet by mouth 2 (two) times daily.    . meclizine (ANTIVERT) 25 MG tablet Take 1 tablet (25 mg total) by mouth 3 (three) times daily as needed for dizziness. 60 tablet 5  . Misc. Devices (ROLLATOR) MISC Use rollator for ambulation 1 each 0  . Multiple Vitamins-Minerals (ZINC PO) Take by mouth daily.    . ondansetron (ZOFRAN) 4 MG tablet TAKE 1 TABLET(4 MG) BY MOUTH EVERY 8 HOURS AS NEEDED FOR NAUSEA OR VOMITING 30 tablet 0  . ondansetron (ZOFRAN-ODT) 8 MG disintegrating tablet DISSOLVE 1 TABLET(8 MG) ON THE TONGUE EVERY 8 HOURS AS NEEDED FOR NAUSEA OR VOMITING 30 tablet 2  . oxyCODONE-acetaminophen (PERCOCET) 7.5-325 MG tablet Take 1 tablet by mouth 4 (four) times daily as needed.    Marland Kitchen POTASSIUM AMINOBENZOATE PO Take 1 tablet by mouth daily.    . pregabalin (LYRICA) 50 MG capsule Take 50 mg by mouth at bedtime.    . prochlorperazine (COMPAZINE) 10 MG tablet Take 1 tablet (10 mg total) by mouth every 6 (six) hours as needed for nausea or vomiting. 30 tablet 0  . triamterene-hydrochlorothiazide (MAXZIDE-25) 37.5-25 MG tablet TAKE 1 TABLET BY MOUTH DAILY 90 tablet 1  . TURMERIC PO Take 1 tablet by mouth 2 (two) times daily.     . vitamin E 400 UNIT capsule Take 400 Units by mouth daily.     No current facility-administered medications on file prior to visit.    Past Medical History:  Diagnosis Date  . ALLERGIC RHINITIS   . Allergy   . Anemia   . ANXIETY   . Arthritis    hands  . Bronchitis   . Chronic fatigue fibromyalgia syndrome   . Complication of anesthesia    says one time waking up she couldn't  breathe, felt like her throat closing up  . DEPRESSION   . Enthesopathy of hip region   . Family history of anesthesia complication    sister with n/v  . FOOT PAIN, BILATERAL   . GERD   . Hiatal hernia   . HYPERLIPIDEMIA   . Hypothyroidism   . IBS   . MIGRAINE HEADACHE   . MIGRAINE, COMMON   . NECK MASS   . OSTEOPENIA   . OSTEOPOROSIS   . PLANTAR FASCIITIS   . RASH-NONVESICULAR   . SINUSITIS- ACUTE-NOS   . SKIN LESION   . VAGINITIS   . VENOUS INSUFFICIENCY, CHRONIC     Past Surgical History:  Procedure Laterality Date  .  APPENDECTOMY    . BREAST ENHANCEMENT SURGERY    . BREAST IMPLANT REMOVAL    . CHOLECYSTECTOMY    . COLONOSCOPY    . FOOT SURGERY  11/06/2016  . MASTECTOMY     bilateral for severe bilateral fibrocystic disease  . OOPHORECTOMY    . s/p neck lump removal    . SHOULDER SURGERY    . SINUS ENDO W/FUSION Right 06/30/2013   Procedure: RIGHT ENDOSCOPIC SPHENOIDECTOMY WITH FUSION SCAN;  Surgeon: [name], MD;  Location: [place];  Service: ENT;  Laterality: Right;  . TONSILLECTOMY    . VAGINAL HYSTERECTOMY      Social History   Socioeconomic History  . Marital status: Widowed    Spouse name: Not on file  . Number of children: 2  . Years of education: Not on file  . Highest education level: Not on file  Occupational History  . Occupation: retired Press photographer  Tobacco Use  . Smoking status: Never Smoker  . Smokeless tobacco: Never Used  Vaping Use  . Vaping Use: Never used  Substance and Sexual Activity  . Alcohol use: No  . Drug use: No  . Sexual activity: Not Currently  Other Topics Concern  . Not on file  Social History Narrative   Has 2 biological children and 1 step child   Social Determinants of Health   Financial Resource Strain:   . Difficulty of Paying Living Expenses: Not on file  Food Insecurity:   . Worried About Charity fundraiser in the Last Year: Not on file  . Ran Out of Food in the Last Year: Not on file    Transportation Needs:   . Lack of Transportation (Medical): Not on file  . Lack of Transportation (Non-Medical): Not on file  Physical Activity:   . Days of Exercise per Week: Not on file  . Minutes of Exercise per Session: Not on file  Stress:   . Feeling of Stress : Not on file  Social Connections:   . Frequency of Communication with Friends and Family: Not on file  . Frequency of Social Gatherings with Friends and Family: Not on file  . Attends Religious Services: Not on file  . Active Member of Clubs or Organizations: Not on file  . Attends Archivist Meetings: Not on file  . Marital Status: Not on file    Family History  Problem Relation Age of Onset  . Diabetes Mother   . Heart disease Father   . Diabetes Father   . Diabetes Sister   . Breast cancer Maternal Grandmother   . Heart disease Maternal Uncle        x 2  . Heart disease Maternal Aunt   . Kidney disease Paternal Uncle        questionable  . Heart disease Son   . Healthy Son   . Irritable bowel syndrome Son   . Colon cancer Neg Hx   . Colon polyps Neg Hx   . Rectal cancer Neg Hx   . Stomach cancer Neg Hx     Review of Systems  Constitutional: Negative for chills and fever.  Cardiovascular: Positive for leg swelling. Negative for chest pain and palpitations.  Musculoskeletal: Positive for back pain (back and leg).  Skin: Positive for color change.       Objective:   Vitals:   03/29/20 1417  BP: 124/86  Pulse: 95  Temp: 98.1 F (36.7 C)  SpO2: 96%   BP Readings  from Last 3 Encounters:  03/29/20 124/86  03/14/20 123/73  02/08/20 110/76   Wt Readings from Last 3 Encounters:  03/29/20 110 lb (49.9 kg)  03/14/20 114 lb 9.6 oz (52 kg)  02/08/20 116 lb (52.6 kg)   Body mass index is 21.66 kg/m.   Physical Exam    Constitutional: Appears well-developed and well-nourished. No distress.  HENT:  Head: Normocephalic and atraumatic.  Neck: Neck supple. No tracheal deviation  present. No thyromegaly present.  No cervical lymphadenopathy Cardiovascular: Normal rate, regular rhythm and normal heart sounds.   No murmur heard. No carotid bruit .  No edema Pulmonary/Chest: Effort normal and breath sounds normal. No respiratory distress. No has no wheezes. No rales.  Skin: Skin is warm and dry. Not diaphoretic. Possible precancerous lesions on face Psychiatric: Normal mood and affect. Behavior is normal.      Assessment & Plan:    See Problem List for Assessment and Plan of chronic medical problems.    This visit occurred during the SARS-CoV-2 public health emergency.  Safety protocols were in place, including screening questions prior to the visit, additional usage of staff PPE, and extensive cleaning of exam room while observing appropriate contact time as indicated for disinfecting solutions.

## 2020-03-28 NOTE — Patient Instructions (Addendum)
  Blood work was ordered.     Flu immunization administered today.     Medications reviewed and updated.  Changes include :   none    A referral was ordered for [facility].     Someone from their office will call you to schedule an appointment.    Please followup in 6 months

## 2020-03-29 ENCOUNTER — Other Ambulatory Visit: Payer: Self-pay

## 2020-03-29 ENCOUNTER — Ambulatory Visit (INDEPENDENT_AMBULATORY_CARE_PROVIDER_SITE_OTHER): Payer: Medicare Other | Admitting: Internal Medicine

## 2020-03-29 ENCOUNTER — Encounter: Payer: Self-pay | Admitting: Internal Medicine

## 2020-03-29 VITALS — BP 124/86 | HR 95 | Temp 98.1°F | Ht 59.75 in | Wt 110.0 lb

## 2020-03-29 DIAGNOSIS — R600 Localized edema: Secondary | ICD-10-CM

## 2020-03-29 DIAGNOSIS — R300 Dysuria: Secondary | ICD-10-CM

## 2020-03-29 DIAGNOSIS — F3289 Other specified depressive episodes: Secondary | ICD-10-CM | POA: Diagnosis not present

## 2020-03-29 DIAGNOSIS — I700 Atherosclerosis of aorta: Secondary | ICD-10-CM | POA: Insufficient documentation

## 2020-03-29 DIAGNOSIS — M8589 Other specified disorders of bone density and structure, multiple sites: Secondary | ICD-10-CM

## 2020-03-29 DIAGNOSIS — Z1283 Encounter for screening for malignant neoplasm of skin: Secondary | ICD-10-CM

## 2020-03-29 DIAGNOSIS — E039 Hypothyroidism, unspecified: Secondary | ICD-10-CM

## 2020-03-29 DIAGNOSIS — Z1159 Encounter for screening for other viral diseases: Secondary | ICD-10-CM

## 2020-03-29 DIAGNOSIS — L989 Disorder of the skin and subcutaneous tissue, unspecified: Secondary | ICD-10-CM

## 2020-03-29 DIAGNOSIS — Z23 Encounter for immunization: Secondary | ICD-10-CM | POA: Diagnosis not present

## 2020-03-29 DIAGNOSIS — F419 Anxiety disorder, unspecified: Secondary | ICD-10-CM | POA: Diagnosis not present

## 2020-03-29 DIAGNOSIS — Z79890 Hormone replacement therapy (postmenopausal): Secondary | ICD-10-CM | POA: Diagnosis not present

## 2020-03-29 DIAGNOSIS — M797 Fibromyalgia: Secondary | ICD-10-CM

## 2020-03-29 LAB — COMPREHENSIVE METABOLIC PANEL
ALT: 22 U/L (ref 0–35)
AST: 27 U/L (ref 0–37)
Albumin: 4.5 g/dL (ref 3.5–5.2)
Alkaline Phosphatase: 34 U/L — ABNORMAL LOW (ref 39–117)
BUN: 9 mg/dL (ref 6–23)
CO2: 28 mEq/L (ref 19–32)
Calcium: 10.4 mg/dL (ref 8.4–10.5)
Chloride: 95 mEq/L — ABNORMAL LOW (ref 96–112)
Creatinine, Ser: 0.66 mg/dL (ref 0.40–1.20)
GFR: 83.53 mL/min (ref >60.00)
Glucose, Bld: 93 mg/dL (ref 70–99)
Potassium: 3.7 mEq/L (ref 3.5–5.1)
Sodium: 132 mEq/L — ABNORMAL LOW (ref 135–145)
Total Bilirubin: 0.6 mg/dL (ref 0.2–1.2)
Total Protein: 7.9 g/dL (ref 6.0–8.3)

## 2020-03-29 LAB — TSH: TSH: 0.54 u[IU]/mL (ref 0.35–4.50)

## 2020-03-29 LAB — LIPID PANEL
Cholesterol: 271 mg/dL — ABNORMAL HIGH (ref 0–200)
HDL: 103.8 mg/dL (ref >39.00)
LDL Cholesterol: 149 mg/dL — ABNORMAL HIGH (ref 0–99)
NonHDL: 166.86
Total CHOL/HDL Ratio: 3
Triglycerides: 87 mg/dL (ref 0.0–149.0)
VLDL: 17.4 mg/dL (ref 0.0–40.0)

## 2020-03-29 LAB — CBC WITH DIFFERENTIAL/PLATELET
Basophils Absolute: 0 10*3/uL (ref 0.0–0.1)
Basophils Relative: 0.4 % (ref 0.0–3.0)
Eosinophils Absolute: 0 10*3/uL (ref 0.0–0.7)
Eosinophils Relative: 0.4 % (ref 0.0–5.0)
HCT: 43 % (ref 36.0–46.0)
Hemoglobin: 14.6 g/dL (ref 12.0–15.0)
Lymphocytes Relative: 23.4 % (ref 12.0–46.0)
Lymphs Abs: 2 10*3/uL (ref 0.7–4.0)
MCHC: 34 g/dL (ref 30.0–36.0)
MCV: 100.2 fl — ABNORMAL HIGH (ref 78.0–100.0)
Monocytes Absolute: 0.9 10*3/uL (ref 0.1–1.0)
Monocytes Relative: 10.9 % (ref 3.0–12.0)
Neutro Abs: 5.5 10*3/uL (ref 1.4–7.7)
Neutrophils Relative %: 64.9 % (ref 43.0–77.0)
Platelets: 298 10*3/uL (ref 150.0–400.0)
RBC: 4.29 Mil/uL (ref 3.87–5.11)
RDW: 13.5 % (ref 11.5–15.5)
WBC: 8.5 10*3/uL (ref 4.0–10.5)

## 2020-03-29 NOTE — Assessment & Plan Note (Signed)
Chronic Estrace 0.5 mg  - benfits outweighs risks -- we did try to take her off int he past and her anxiety and pain was much much worse continue estrace 0.5 mg

## 2020-03-29 NOTE — Assessment & Plan Note (Signed)
Chronic Controlled, stable Continue wellbutrin XL 450 mg daily  Takes natural supplements

## 2020-03-29 NOTE — Assessment & Plan Note (Signed)
Chronic Controlled, stable Continue triamterene-hctz 37.5-25 mg daily

## 2020-03-29 NOTE — Assessment & Plan Note (Signed)
Chronic Slightly better with steroid injection for back Continue wellbutrin XL 450 mg daily, estradiol 0.5mg  daily

## 2020-03-29 NOTE — Addendum Note (Signed)
Addended by: Jacob Moores on: 03/29/2020 03:14 PM   Modules accepted: Orders

## 2020-03-29 NOTE — Addendum Note (Signed)
Addended by: Jacob Moores on: 03/29/2020 03:39 PM   Modules accepted: Orders

## 2020-03-29 NOTE — Assessment & Plan Note (Signed)
Chronic dexa due - ordered Not able to exercise Taking calcium, vitamin d

## 2020-03-29 NOTE — Assessment & Plan Note (Addendum)
Chronic  Clinically euthyroid Currently taking levothyroxine 75 mcg Check tsh  Titrate med dose if needed

## 2020-03-29 NOTE — Assessment & Plan Note (Signed)
Chronic Controlled, stable Continue wellbutrin XL 450 mg daily and Xanax 0.5 mg TID prn Takes natural supplements

## 2020-03-29 NOTE — Addendum Note (Signed)
Addended by: Marcina Millard on: 03/29/2020 03:33 PM   Modules accepted: Orders

## 2020-03-30 ENCOUNTER — Other Ambulatory Visit: Payer: Self-pay | Admitting: Internal Medicine

## 2020-03-30 DIAGNOSIS — F419 Anxiety disorder, unspecified: Secondary | ICD-10-CM

## 2020-04-01 DIAGNOSIS — G894 Chronic pain syndrome: Secondary | ICD-10-CM | POA: Diagnosis not present

## 2020-04-01 DIAGNOSIS — M5136 Other intervertebral disc degeneration, lumbar region: Secondary | ICD-10-CM | POA: Diagnosis not present

## 2020-04-01 DIAGNOSIS — Z79891 Long term (current) use of opiate analgesic: Secondary | ICD-10-CM | POA: Diagnosis not present

## 2020-04-01 DIAGNOSIS — Z79899 Other long term (current) drug therapy: Secondary | ICD-10-CM | POA: Diagnosis not present

## 2020-04-01 LAB — URINE CULTURE

## 2020-04-01 LAB — HEPATITIS C ANTIBODY
Hepatitis C Ab: NONREACTIVE
SIGNAL TO CUT-OFF: 0.01 (ref <1.00)

## 2020-04-02 NOTE — Addendum Note (Signed)
Addended by: Marcina Millard on: 04/02/2020 09:16 AM   Modules accepted: Orders

## 2020-04-03 ENCOUNTER — Encounter: Payer: Self-pay | Admitting: Internal Medicine

## 2020-04-04 ENCOUNTER — Encounter: Payer: Self-pay | Admitting: Internal Medicine

## 2020-04-07 ENCOUNTER — Other Ambulatory Visit: Payer: Self-pay | Admitting: Internal Medicine

## 2020-04-08 DIAGNOSIS — M5417 Radiculopathy, lumbosacral region: Secondary | ICD-10-CM | POA: Diagnosis not present

## 2020-04-08 DIAGNOSIS — M79605 Pain in left leg: Secondary | ICD-10-CM | POA: Diagnosis not present

## 2020-04-08 DIAGNOSIS — M545 Low back pain, unspecified: Secondary | ICD-10-CM | POA: Diagnosis not present

## 2020-04-08 DIAGNOSIS — M6281 Muscle weakness (generalized): Secondary | ICD-10-CM | POA: Diagnosis not present

## 2020-04-10 ENCOUNTER — Other Ambulatory Visit: Payer: Self-pay

## 2020-04-10 ENCOUNTER — Ambulatory Visit (INDEPENDENT_AMBULATORY_CARE_PROVIDER_SITE_OTHER)
Admission: RE | Admit: 2020-04-10 | Discharge: 2020-04-10 | Disposition: A | Discharge status: Home / Self Care | Payer: Medicare Other | Source: Ambulatory Visit | Attending: Internal Medicine | Admitting: Internal Medicine

## 2020-04-10 DIAGNOSIS — M8589 Other specified disorders of bone density and structure, multiple sites: Secondary | ICD-10-CM | POA: Diagnosis not present

## 2020-04-12 ENCOUNTER — Encounter: Payer: Self-pay | Admitting: Internal Medicine

## 2020-04-18 DIAGNOSIS — H353122 Nonexudative age-related macular degeneration, left eye, intermediate dry stage: Secondary | ICD-10-CM | POA: Diagnosis not present

## 2020-04-18 DIAGNOSIS — H353111 Nonexudative age-related macular degeneration, right eye, early dry stage: Secondary | ICD-10-CM | POA: Diagnosis not present

## 2020-04-18 DIAGNOSIS — H43392 Other vitreous opacities, left eye: Secondary | ICD-10-CM | POA: Diagnosis not present

## 2020-04-18 DIAGNOSIS — H35432 Paving stone degeneration of retina, left eye: Secondary | ICD-10-CM | POA: Diagnosis not present

## 2020-04-18 DIAGNOSIS — H35363 Drusen (degenerative) of macula, bilateral: Secondary | ICD-10-CM | POA: Diagnosis not present

## 2020-04-29 DIAGNOSIS — M5416 Radiculopathy, lumbar region: Secondary | ICD-10-CM | POA: Diagnosis not present

## 2020-04-29 DIAGNOSIS — M5136 Other intervertebral disc degeneration, lumbar region: Secondary | ICD-10-CM | POA: Diagnosis not present

## 2020-04-29 DIAGNOSIS — M5126 Other intervertebral disc displacement, lumbar region: Secondary | ICD-10-CM | POA: Diagnosis not present

## 2020-04-29 DIAGNOSIS — M79605 Pain in left leg: Secondary | ICD-10-CM | POA: Diagnosis not present

## 2020-04-30 ENCOUNTER — Other Ambulatory Visit: Payer: Self-pay

## 2020-04-30 ENCOUNTER — Ambulatory Visit: Payer: Medicare Other | Admitting: Podiatry

## 2020-04-30 ENCOUNTER — Encounter: Payer: Self-pay | Admitting: Podiatry

## 2020-04-30 ENCOUNTER — Ambulatory Visit (INDEPENDENT_AMBULATORY_CARE_PROVIDER_SITE_OTHER): Payer: Medicare Other

## 2020-04-30 DIAGNOSIS — M778 Other enthesopathies, not elsewhere classified: Secondary | ICD-10-CM

## 2020-04-30 DIAGNOSIS — M2011 Hallux valgus (acquired), right foot: Secondary | ICD-10-CM

## 2020-04-30 NOTE — Progress Notes (Signed)
Subjective:  Patient ID: Laura Barnes, female    DOB: 06-07-1940,  MRN: [id number] HPI Chief Complaint  Patient presents with  . Foot Pain    1st MPJ left - previous bunion surgery, aching x few months, does have a back issue-pinched nerve  . Foot Pain    1st MPJ right - bunion deformity, wanted checked, sometimes achy  . New Patient (Initial Visit)    Est pt 01/2017    79 y.o. female presents with the above complaint.   ROS: Denies fever chills nausea vomiting muscle aches pains calf pain back pain chest pain shortness of breath.  Past Medical History:  Diagnosis Date  . ALLERGIC RHINITIS   . Allergy   . Anemia   . ANXIETY   . Arthritis    hands  . Bronchitis   . Chronic fatigue fibromyalgia syndrome   . Complication of anesthesia    says one time waking up she couldn't breathe, felt like her throat closing up  . DEPRESSION   . Enthesopathy of hip region   . Family history of anesthesia complication    sister with n/v  . FOOT PAIN, BILATERAL   . GERD   . Hiatal hernia   . HYPERLIPIDEMIA   . Hypothyroidism   . IBS   . MIGRAINE HEADACHE   . MIGRAINE, COMMON   . NECK MASS   . OSTEOPENIA   . OSTEOPOROSIS   . PLANTAR FASCIITIS   . RASH-NONVESICULAR   . SINUSITIS- ACUTE-NOS   . SKIN LESION   . VAGINITIS   . VENOUS INSUFFICIENCY, CHRONIC    Past Surgical History:  Procedure Laterality Date  . APPENDECTOMY    . BREAST ENHANCEMENT SURGERY    . BREAST IMPLANT REMOVAL    . CHOLECYSTECTOMY    . COLONOSCOPY    . FOOT SURGERY  11/06/2016  . MASTECTOMY     bilateral for severe bilateral fibrocystic disease  . OOPHORECTOMY    . s/p neck lump removal    . SHOULDER SURGERY    . SINUS ENDO W/FUSION Right 06/30/2013   Procedure: RIGHT ENDOSCOPIC SPHENOIDECTOMY WITH FUSION SCAN;  Surgeon: [name], MD;  Location: [place];  Service: ENT;  Laterality: Right;  . TONSILLECTOMY    . VAGINAL HYSTERECTOMY      Current Outpatient Medications:  .  buPROPion  (WELLBUTRIN XL) 300 MG 24 hr tablet, TAKE 1 TABLET(300 MG) BY MOUTH DAILY, Disp: 90 tablet, Rfl: 1 .  estradiol (ESTRACE) 0.5 MG tablet, TAKE 1 TABLET(0.5 MG) BY MOUTH DAILY, Disp: 90 tablet, Rfl: 1 .  acetaminophen (TYLENOL) 500 MG tablet, Take 1,000 mg by mouth every 6 (six) hours as needed for mild pain, moderate pain, fever or headache., Disp: , Rfl:  .  ALPRAZolam (XANAX) 0.5 MG tablet, TAKE 1 TABLET(0.5 MG) BY MOUTH THREE TIMES DAILY AS NEEDED FOR ANXIETY, Disp: 90 tablet, Rfl: 0 .  Amino Acids (AMINO ACID PO), Take 1 capsule by mouth 2 (two) times daily., Disp: , Rfl:  .  Ascorbic Acid (VITAMIN C) 1000 MG tablet, Take 1,000 mg by mouth 2 (two) times daily., Disp: , Rfl:  .  B Complex-Biotin-FA (B COMPLETE) TABS, Take 1 tablet by mouth daily. B Complete 100mg , Disp: , Rfl:  .  benzonatate (TESSALON) 200 MG capsule, Take 1 capsule (200 mg total) by mouth 3 (three) times daily as needed for cough., Disp: 90 capsule, Rfl: 5 .  buPROPion (WELLBUTRIN XL) 150 MG 24 hr tablet, TAKE 1 TABLET(150 MG)  BY MOUTH EVERY MORNING.  FOR TOTAL OF 450 MG (Patient taking differently: TAKE 1 TABLET(150 MG) BY MOUTH EVERY MORNING AS NEEDED  FOR TOTAL OF 450 MG), Disp: 90 tablet, Rfl: 1 .  Cholecalciferol (VITAMIN D3) 1000 units CAPS, Take 1,000 Units by mouth daily., Disp: , Rfl:  .  Coenzyme Q10 (CO Q-10) 200 MG CAPS, Take 1 capsule by mouth 2 (two) times daily., Disp: , Rfl:  .  ECHINACEA PO, Take 1 tablet by mouth 2 (two) times daily., Disp: , Rfl:  .  ELDERBERRY PO, Take by mouth daily., Disp: , Rfl:  .  fluticasone (FLONASE) 50 MCG/ACT nasal spray, Place 2 sprays into both nostrils as needed. , Disp: , Rfl:  .  Green Tea, Camellia sinensis, (EGCG) POWD, Take 1 capsule by mouth daily., Disp: , Rfl:  .  Homeopathic Products (ARNICA [place]) PLLT, Take by mouth daily., Disp: , Rfl:  .  KRILL OIL PO, Take by mouth daily., Disp: , Rfl:  .  LECITHIN PO, Take 1 tablet by mouth daily., Disp: , Rfl:  .  levothyroxine  (SYNTHROID) 75 MCG tablet, TAKE 1 TABLET BY MOUTH EVERY DAY IN THE MORNING, Disp: 90 tablet, Rfl: 1 .  MAGNESIUM ASPARTATE PO, Take 1 tablet by mouth 2 (two) times daily., Disp: , Rfl:  .  meclizine (ANTIVERT) 25 MG tablet, Take 1 tablet (25 mg total) by mouth 3 (three) times daily as needed for dizziness., Disp: 60 tablet, Rfl: 5 .  Misc. Devices (ROLLATOR) MISC, Use rollator for ambulation, Disp: 1 each, Rfl: 0 .  Multiple Vitamins-Minerals (ZINC PO), Take by mouth daily., Disp: , Rfl:  .  ondansetron (ZOFRAN) 4 MG tablet, TAKE 1 TABLET(4 MG) BY MOUTH EVERY 8 HOURS AS NEEDED FOR NAUSEA OR VOMITING, Disp: 30 tablet, Rfl: 0 .  ondansetron (ZOFRAN-ODT) 8 MG disintegrating tablet, DISSOLVE 1 TABLET(8 MG) ON THE TONGUE EVERY 8 HOURS AS NEEDED FOR NAUSEA OR VOMITING, Disp: 30 tablet, Rfl: 2 .  oxyCODONE-acetaminophen (PERCOCET) 7.5-325 MG tablet, Take 1 tablet by mouth 4 (four) times daily as needed., Disp: , Rfl:  .  POTASSIUM AMINOBENZOATE PO, Take 1 tablet by mouth daily., Disp: , Rfl:  .  pregabalin (LYRICA) 50 MG capsule, Take 50 mg by mouth at bedtime., Disp: , Rfl:  .  prochlorperazine (COMPAZINE) 10 MG tablet, Take 1 tablet (10 mg total) by mouth every 6 (six) hours as needed for nausea or vomiting., Disp: 30 tablet, Rfl: 0 .  triamterene-hydrochlorothiazide (MAXZIDE-25) 37.5-25 MG tablet, TAKE 1 TABLET BY MOUTH DAILY, Disp: 90 tablet, Rfl: 1 .  TURMERIC PO, Take 1 tablet by mouth 2 (two) times daily. , Disp: , Rfl:  .  vitamin E 400 UNIT capsule, Take 400 Units by mouth daily., Disp: , Rfl:   Allergies  Allergen Reactions  . Prednisone Anaphylaxis    Patient unable to remember why she needed to steroid shot but just knows that when she got back to work she started having a lot of trouble breathing.  (jkl 05/04/14)  Multiple epidural steroid injections with depomedrol with no issues. (05/04/18)  . Amoxicillin-Pot Clavulanate Hives and Diarrhea    Has patient had a PCN reaction causing  immediate rash, facial/tongue/throat swelling, SOB or lightheadedness with hypotension: Unknown Has patient had a PCN reaction causing severe rash involving mucus membranes or skin necrosis: Unknown Has patient had a PCN reaction that required hospitalization: Unknown Has patient had a PCN reaction occurring within the last 10 years: Unknown If all of  the above answers are "NO", then may proceed with Cephalosporin use.   . Aspirin Other (See Comments)    Stomach ache and bleed  . Erythromycin Diarrhea  . Levofloxacin Other (See Comments)    Headaches, GI upset  . Nsaids Other (See Comments)    stomach bleeding per pt  . Statins Nausea Only and Other (See Comments)    Headache, upset stomach, joint hurt, heartburn  . Sumatriptan Hives and Palpitations  . Adhesive [Tape] Other (See Comments)    blisters  . Ivp Dye [Iodinated Diagnostic Agents]     If made with blue shellfish then CAN NOT have this type of dye.  Pt was given contrast 10/12/17, not premedicated, had NO COMPLICATIONS- BLO, CT  . Methylphenidate Hcl     Unknown reaction  . Mirtazapine     Unknown reaction  . Oxycodone     Itching   . Red Yeast Rice [Cholestin] Other (See Comments)    Pt reports causes aching pains like statins do  . Shellfish Allergy     Blue shellfish  . Soy Allergy     Unknown reaction  . Doxycycline Diarrhea  . Hydrocodone Itching  . Hydrocodone-Acetaminophen Itching  . Latex Other (See Comments)    blisters  . Rofecoxib Nausea Only  . Sertraline Hcl Nausea Only  . Sulfonamide Derivatives Itching   Review of Systems Objective:  There were no vitals filed for this visit.  General: Well developed, nourished, in no acute distress, alert and oriented x3   Dermatological: Skin is warm, dry and supple bilateral. Nails x 10 are well maintained; remaining integument appears unremarkable at this time. There are no open sores, no preulcerative lesions, no rash or signs of infection  present.  Vascular: Dorsalis Pedis artery and Posterior Tibial artery pedal pulses are 2/4 bilateral with immedate capillary fill time. Pedal hair growth present. No varicosities and no lower extremity edema present bilateral.   Neruologic: Grossly intact via light touch bilateral. Vibratory intact via tuning fork bilateral. Protective threshold with Semmes Wienstein monofilament intact to all pedal sites bilateral. Patellar and Achilles deep tendon reflexes 2+ bilateral. No Babinski or clonus noted bilateral.   Musculoskeletal: No gross boney pedal deformities bilateral. No pain, crepitus, or limitation noted with foot and ankle range of motion bilateral. Muscular strength 5/5 in all groups tested bilateral.  Hallux abductovalgus deformity of the right foot with flexible hammertoe deformities nontender on painful range of motion.  This is reducible does not appear to be track bound.  Left foot does not demonstrate any reproducible pain.  Gait: Unassisted, Nonantalgic.    Radiographs:  Radiographs taken today bilateral foot demonstrate a bunion correction left foot fifth metatarsal osteotomy with retention of 2 screws retention of screws to toes #2 #3 #4 of the left foot.  It appears that there is been a capital osteotomy to the first metatarsal but no internal fixation.  Right foot demonstrates hallux abductovalgus deformity with hammertoe deformities across the board.  No acute findings.  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  Flexible hammertoes 2 through 5.  Sciatica with disc compression nerve impingement.  Plan: Discussed etiology pathology conservative surgical therapies at this point I expressed to her that I would be happy to do the surgery however I would like for her to get her back pain under control primarily because I do you think that being nonmobile after foot surgery would  interfere with her back.  Also carrying the extra weight of the boot would probably do the same.     Karysa Heft T. [place], [place]

## 2020-05-01 ENCOUNTER — Other Ambulatory Visit: Payer: Self-pay | Admitting: Internal Medicine

## 2020-05-01 DIAGNOSIS — F419 Anxiety disorder, unspecified: Secondary | ICD-10-CM

## 2020-05-01 DIAGNOSIS — H353131 Nonexudative age-related macular degeneration, bilateral, early dry stage: Secondary | ICD-10-CM | POA: Diagnosis not present

## 2020-05-01 DIAGNOSIS — Z961 Presence of intraocular lens: Secondary | ICD-10-CM | POA: Diagnosis not present

## 2020-05-01 DIAGNOSIS — H5213 Myopia, bilateral: Secondary | ICD-10-CM | POA: Diagnosis not present

## 2020-05-09 DIAGNOSIS — M545 Low back pain, unspecified: Secondary | ICD-10-CM | POA: Diagnosis not present

## 2020-05-20 ENCOUNTER — Telehealth: Payer: Self-pay | Admitting: Family Medicine

## 2020-05-20 DIAGNOSIS — M5136 Other intervertebral disc degeneration, lumbar region: Secondary | ICD-10-CM | POA: Diagnosis not present

## 2020-05-20 DIAGNOSIS — M9905 Segmental and somatic dysfunction of pelvic region: Secondary | ICD-10-CM | POA: Diagnosis not present

## 2020-05-20 DIAGNOSIS — M9904 Segmental and somatic dysfunction of sacral region: Secondary | ICD-10-CM | POA: Diagnosis not present

## 2020-05-20 DIAGNOSIS — M9903 Segmental and somatic dysfunction of lumbar region: Secondary | ICD-10-CM | POA: Diagnosis not present

## 2020-05-20 NOTE — Telephone Encounter (Signed)
Patient called asking if someone could call her back to discuss her current treatment and other issues going on. She asked to schedule an appointment with Dr Georgina Snell (scheduled for 05/27/2020 - his first available) but wanted to speak to someone prior.  Please advise.

## 2020-05-21 NOTE — Telephone Encounter (Signed)
Called pt and she does not need to speak to anyone prior to her appt w/ Dr. Georgina Snell on 05/27/20.  She states that she would like to discuss her situation w/ pain management and discuss her overall treatment w/ Dr. Georgina Snell.

## 2020-05-22 DIAGNOSIS — M5417 Radiculopathy, lumbosacral region: Secondary | ICD-10-CM | POA: Diagnosis not present

## 2020-05-22 DIAGNOSIS — M79605 Pain in left leg: Secondary | ICD-10-CM | POA: Diagnosis not present

## 2020-05-22 DIAGNOSIS — M9905 Segmental and somatic dysfunction of pelvic region: Secondary | ICD-10-CM | POA: Diagnosis not present

## 2020-05-22 DIAGNOSIS — L570 Actinic keratosis: Secondary | ICD-10-CM | POA: Diagnosis not present

## 2020-05-22 DIAGNOSIS — M9904 Segmental and somatic dysfunction of sacral region: Secondary | ICD-10-CM | POA: Diagnosis not present

## 2020-05-22 DIAGNOSIS — M6281 Muscle weakness (generalized): Secondary | ICD-10-CM | POA: Diagnosis not present

## 2020-05-22 DIAGNOSIS — M9903 Segmental and somatic dysfunction of lumbar region: Secondary | ICD-10-CM | POA: Diagnosis not present

## 2020-05-22 DIAGNOSIS — M545 Low back pain, unspecified: Secondary | ICD-10-CM | POA: Diagnosis not present

## 2020-05-22 DIAGNOSIS — M5136 Other intervertebral disc degeneration, lumbar region: Secondary | ICD-10-CM | POA: Diagnosis not present

## 2020-05-22 DIAGNOSIS — M25562 Pain in left knee: Secondary | ICD-10-CM | POA: Diagnosis not present

## 2020-05-27 ENCOUNTER — Ambulatory Visit: Payer: Medicare Other | Admitting: Family Medicine

## 2020-05-27 NOTE — Progress Notes (Deleted)
   I, Philbert Riser, LAT, ATC acting as a scribe for Clementeen Graham, MD.  Laura Barnes is a 79 y.o. female who presents to [facility] at [facility] today for left knee pain and to discuss her overall treatment plan. Patient received Gelsyn injection left knee 3/3 on 01/01/2020.    Pertinent review of systems: ***  Relevant historical information: ***   Exam:  There were no vitals taken for this visit. General: Well Developed, well nourished, and in no acute distress.   MSK: ***    Lab and Radiology Results No results found for this or any previous visit (from the past 72 hour(s)). No results found.     Assessment and Plan: 79 y.o. female with ***   PDMP not reviewed this encounter. No orders of the defined types were placed in this encounter.  No orders of the defined types were placed in this encounter.    Discussed warning signs or symptoms. Please see discharge instructions. Patient expresses understanding.   ***

## 2020-05-28 DIAGNOSIS — M9905 Segmental and somatic dysfunction of pelvic region: Secondary | ICD-10-CM | POA: Diagnosis not present

## 2020-05-28 DIAGNOSIS — M9903 Segmental and somatic dysfunction of lumbar region: Secondary | ICD-10-CM | POA: Diagnosis not present

## 2020-05-28 DIAGNOSIS — M9904 Segmental and somatic dysfunction of sacral region: Secondary | ICD-10-CM | POA: Diagnosis not present

## 2020-05-28 DIAGNOSIS — M5136 Other intervertebral disc degeneration, lumbar region: Secondary | ICD-10-CM | POA: Diagnosis not present

## 2020-05-29 DIAGNOSIS — M25562 Pain in left knee: Secondary | ICD-10-CM | POA: Diagnosis not present

## 2020-05-29 DIAGNOSIS — M79605 Pain in left leg: Secondary | ICD-10-CM | POA: Diagnosis not present

## 2020-05-29 DIAGNOSIS — M545 Low back pain, unspecified: Secondary | ICD-10-CM | POA: Diagnosis not present

## 2020-05-29 DIAGNOSIS — M6281 Muscle weakness (generalized): Secondary | ICD-10-CM | POA: Diagnosis not present

## 2020-05-29 DIAGNOSIS — M5417 Radiculopathy, lumbosacral region: Secondary | ICD-10-CM | POA: Diagnosis not present

## 2020-06-03 ENCOUNTER — Other Ambulatory Visit: Payer: Self-pay | Admitting: Internal Medicine

## 2020-06-03 DIAGNOSIS — F419 Anxiety disorder, unspecified: Secondary | ICD-10-CM

## 2020-06-06 DIAGNOSIS — M25562 Pain in left knee: Secondary | ICD-10-CM | POA: Diagnosis not present

## 2020-06-06 DIAGNOSIS — M5417 Radiculopathy, lumbosacral region: Secondary | ICD-10-CM | POA: Diagnosis not present

## 2020-06-06 DIAGNOSIS — R2689 Other abnormalities of gait and mobility: Secondary | ICD-10-CM | POA: Diagnosis not present

## 2020-06-06 DIAGNOSIS — M79605 Pain in left leg: Secondary | ICD-10-CM | POA: Diagnosis not present

## 2020-06-06 DIAGNOSIS — M545 Low back pain, unspecified: Secondary | ICD-10-CM | POA: Diagnosis not present

## 2020-06-06 DIAGNOSIS — M797 Fibromyalgia: Secondary | ICD-10-CM | POA: Diagnosis not present

## 2020-06-06 DIAGNOSIS — M6281 Muscle weakness (generalized): Secondary | ICD-10-CM | POA: Diagnosis not present

## 2020-06-10 ENCOUNTER — Telehealth: Payer: Self-pay | Admitting: Family Medicine

## 2020-06-10 DIAGNOSIS — M4317 Spondylolisthesis, lumbosacral region: Secondary | ICD-10-CM | POA: Diagnosis not present

## 2020-06-10 NOTE — Telephone Encounter (Signed)
Pt saw Dr. Lynann Bologna today. She is interested in continuing the "back injections" she has been getting but does not want to see Dr. Vira Blanco anymore. Dr. Lynann Bologna recommended she ask [place] if Dr. Georgina Snell would be willing to continue these injections. I asked, but pt did not have any more description/information to provide and stated that Dr. Georgina Snell would know what she was referring to.

## 2020-06-11 DIAGNOSIS — H43392 Other vitreous opacities, left eye: Secondary | ICD-10-CM | POA: Diagnosis not present

## 2020-06-11 DIAGNOSIS — H43391 Other vitreous opacities, right eye: Secondary | ICD-10-CM | POA: Diagnosis not present

## 2020-06-11 NOTE — Telephone Encounter (Signed)
I am happy to talk to you about it.  The last time I looked at your back was June.  Back then it looks like you probably needed back surgery.  Happy to talk to you about the back in further detail if needed.  Recommend that you schedule follow-up appointment.

## 2020-06-11 NOTE — Telephone Encounter (Signed)
Called pt and scheduled her for f/u visit w/ Dr. Georgina Snell on 06/18/20.

## 2020-06-18 ENCOUNTER — Ambulatory Visit: Payer: Medicare Other | Admitting: Family Medicine

## 2020-06-21 NOTE — Progress Notes (Signed)
I, [name], LAT, ATC, am serving as scribe for Dr. Lynne Leader.  Laura Barnes is a 80 y.o. female who presents to [place] at [facility] today for f/u of back pain and likely L4 lumbar radiculopathy. Pt was last seen by Dr. Georgina Snell on 01/01/20 for chronic L knee pain and more recently by Dr. Tamala Julian on 02/08/20 for L shoulder pain. She was seen on 11/20/19 for LBP/sacral pain and L lower leg pain and was referred for a lumbar ESI that she had on 12/01/19.  She is being followed by pain management.  Today, pt reports L SIJ joint pain w radiating pain into her L LE to her ankle.  She denies any numbness/tingling into her L LE.  She typically takes oxycodone-acetaminophen but she has run out of this.  She has been seeing Dr. Vira Blanco for pain management and this is who prescribes her oxycodone but she no longer wants to con't w/ him.  She has several causes of chronic pain including left radicular pain and pain in the left SI joint.  She notes that she is done well with left SI joint injections and would like a repeat SI joint injection if possible.   Dx imaging: L-spine MRI - 11/16/19, 10/02/19 and 07/25/17  Pertinent review of systems: No fevers or chills  Relevant historical information: Scoliosis, depression   Exam:  BP (!) 160/92 (BP Location: Right Arm, Patient Position: Sitting, Cuff Size: Normal)   Pulse 94   Ht 4' 11.75" (1.518 m)   Wt 120 lb (54.4 kg)   SpO2 99%   BMI 23.63 kg/m  General: Well Developed, well nourished, and in no acute distress.   MSK: L-spine decreased motion especially limited with extension. Tender palpation left SI joint. Lower extremity strength is intact.    Lab and Radiology Results  Procedure: Real-time Ultrasound Guided Injection of left SI joint Device: Philips Affiniti 50G Images permanently stored and available for review in PACS Verbal informed consent obtained.  Discussed risks and benefits of procedure. Warned about  infection bleeding damage to structures skin hypopigmentation and fat atrophy among others. Patient expresses understanding and agreement Time-out conducted.   Noted no overlying erythema, induration, or other signs of local infection.   Skin prepped in a sterile fashion.   Local anesthesia: Topical Ethyl chloride.   With sterile technique and under real time ultrasound guidance:  40 mg of Kenalog and 2 mL of Marcaine injected into left SI joint. Fluid seen entering the joint.   Completed without difficulty   Pain immediately resolved suggesting accurate placement of the medication.   Advised to call if fevers/chills, erythema, induration, drainage, or persistent bleeding.   Images permanently stored and available for review in the ultrasound unit.  Impression: Technically successful ultrasound guided injection.    EXAM: MRI LUMBAR SPINE WITHOUT CONTRAST  TECHNIQUE: Multiplanar, multisequence MR imaging of the lumbar spine was performed. No intravenous contrast was administered.  COMPARISON:  10/02/2019 MRI  FINDINGS: Segmentation:  Standard.  Alignment: 6 mm grade 1 anterolisthesis L4 on L5, unchanged. 4 mm grade 1 anterolisthesis L3 on L4, unchanged. Slight retrolisthesis of L2 on L3 and L5 on S1 is unchanged. Lumbar levocurvature.  Vertebrae: No fracture, evidence of discitis, or bone lesion. Multilevel discogenic endplate marrow changes, most pronounced at L4-5.  Conus medullaris and cauda equina: Conus extends to the L1 level. Conus and cauda equina appear normal.  Paraspinal and other soft tissues: Negative.  Disc levels:  T12-L1:  Bilateral perineural cysts.  No foraminal or canal stenosis.  L1-L2: Mild disc bulge. Bilateral perineural cysts. No foraminal or canal stenosis.  L2-L3: Posterior disc osteophyte complex with mild right greater than left facet arthropathy results in mild canal stenosis with severe right and mild left foraminal stenosis. No  interval progression from prior.  L3-L4: Disc uncovering with endplate spurring and bilateral facet arthropathy resulting in mild-to-moderate right and mild left foraminal stenosis with mild canal stenosis. No interval progression from prior.  L4-L5: Very large left subarticular disc extrusion (series 11, images 24-27; series 5, images 10-12) extending cranially 1.7 cm to the level of the L4 superior endplate. This results in mass effect on the thecal sac with mild canal stenosis, severe left subarticular recess stenosis and severe left foraminal stenosis. There is also moderate bilateral facet arthropathy at this level. No interval progression from 10/02/2019.  I, Lynne Leader, personally (independently) visualized and performed the interpretation of the images attached in this note.      Assessment and Plan: 79 y.o. female with chronic low back pain with lumbar radiculopathy.  Multifactorial cause of pain.  The main pain being addressed today is the left SI joint pain.  Patient had great response to injection in clinic today indicating that this is a major pain generator.  If she does not have good long-lasting benefit from today's injection she is a good candidate for SI joint ablation and I will be happy to place referral to a different PMNR pain management specialist to proceed with this procedure.  She continues to have bothersome lumbar radiculopathy.  I will request medical records from Dr. Jodene Nam office to see what has been done about this.  My understanding after talking with her is that she has had several epidural steroid injections and that she has had a consultation with Dr. Lynann Bologna and been offered surgery.  She is very reluctant to consider surgery but it seems as though that may be her best option.  Medical management for chronic pain: At this point she has discontinued her medical care with Dr. Vira Blanco for her medical management with opiates for chronic pain.  I  explained that most primary care doctors and most doctors that do not specialize in pain management are not going to be able to prescribe chronic opiates myself included.  Explained that Dr. Quay Burow is probably also not going to be prescribing chronic opiates.  She requested a referral to [place] pain management which I think is reasonable.  Referral ordered today.   PDMP not reviewed this encounter. Orders Placed This Encounter  Procedures  . [place] LIMITED JOINT SPACE STRUCTURES LOW LEFT(NO LINKED CHARGES)    Order Specific Question:   Reason for Exam (SYMPTOM  OR DIAGNOSIS REQUIRED)    Answer:   L SI joint pain    Order Specific Question:   Preferred imaging location?    Answer:   [facility]  . Ambulatory referral to Pain Clinic    Referral Priority:   Routine    Referral Type:   Consultation    Referral Reason:   Specialty Services Required    Requested Specialty:   Pain Medicine    Number of Visits Requested:   1   No orders of the defined types were placed in this encounter.    Discussed warning signs or symptoms. Please see discharge instructions. Patient expresses understanding.   The above documentation has been reviewed and is accurate and complete Lynne Leader, M.D.

## 2020-06-24 ENCOUNTER — Ambulatory Visit: Payer: Self-pay

## 2020-06-24 ENCOUNTER — Other Ambulatory Visit: Payer: Self-pay

## 2020-06-24 ENCOUNTER — Encounter: Payer: Self-pay | Admitting: Family Medicine

## 2020-06-24 ENCOUNTER — Ambulatory Visit: Payer: Medicare Other | Admitting: Family Medicine

## 2020-06-24 VITALS — BP 160/92 | HR 94 | Ht 59.75 in | Wt 120.0 lb

## 2020-06-24 DIAGNOSIS — M533 Sacrococcygeal disorders, not elsewhere classified: Secondary | ICD-10-CM

## 2020-06-24 DIAGNOSIS — M5416 Radiculopathy, lumbar region: Secondary | ICD-10-CM | POA: Diagnosis not present

## 2020-06-24 DIAGNOSIS — G8929 Other chronic pain: Secondary | ICD-10-CM

## 2020-06-24 DIAGNOSIS — G894 Chronic pain syndrome: Secondary | ICD-10-CM | POA: Diagnosis not present

## 2020-06-24 NOTE — Patient Instructions (Addendum)
You had a L SI joint injection today.  Call or go to the ER if you develop a large red swollen joint with extreme pain or oozing puss.   Call or go to the ER if you develop a large red swollen joint with extreme pain or oozing puss.

## 2020-06-26 DIAGNOSIS — M797 Fibromyalgia: Secondary | ICD-10-CM | POA: Diagnosis not present

## 2020-06-26 DIAGNOSIS — M5417 Radiculopathy, lumbosacral region: Secondary | ICD-10-CM | POA: Diagnosis not present

## 2020-06-26 DIAGNOSIS — M6281 Muscle weakness (generalized): Secondary | ICD-10-CM | POA: Diagnosis not present

## 2020-06-26 DIAGNOSIS — M545 Low back pain, unspecified: Secondary | ICD-10-CM | POA: Diagnosis not present

## 2020-06-26 DIAGNOSIS — M25562 Pain in left knee: Secondary | ICD-10-CM | POA: Diagnosis not present

## 2020-06-26 DIAGNOSIS — R2689 Other abnormalities of gait and mobility: Secondary | ICD-10-CM | POA: Diagnosis not present

## 2020-06-26 DIAGNOSIS — M79605 Pain in left leg: Secondary | ICD-10-CM | POA: Diagnosis not present

## 2020-07-01 DIAGNOSIS — G8929 Other chronic pain: Secondary | ICD-10-CM | POA: Diagnosis not present

## 2020-07-01 DIAGNOSIS — Z79899 Other long term (current) drug therapy: Secondary | ICD-10-CM | POA: Diagnosis not present

## 2020-07-01 DIAGNOSIS — M129 Arthropathy, unspecified: Secondary | ICD-10-CM | POA: Diagnosis not present

## 2020-07-01 DIAGNOSIS — M533 Sacrococcygeal disorders, not elsewhere classified: Secondary | ICD-10-CM | POA: Diagnosis not present

## 2020-07-01 DIAGNOSIS — M5416 Radiculopathy, lumbar region: Secondary | ICD-10-CM | POA: Diagnosis not present

## 2020-07-02 ENCOUNTER — Telehealth: Payer: Self-pay | Admitting: Family Medicine

## 2020-07-02 DIAGNOSIS — M533 Sacrococcygeal disorders, not elsewhere classified: Secondary | ICD-10-CM

## 2020-07-02 DIAGNOSIS — G8929 Other chronic pain: Secondary | ICD-10-CM

## 2020-07-02 NOTE — Telephone Encounter (Signed)
I called and spoke to [facility].They do not do SI joint ablations there.  They are offered at [facility] or [facility] (Dr Mina Marble).

## 2020-07-02 NOTE — Telephone Encounter (Signed)
I have already sent a referral for chronic pain management to [facility].  I do not believe they need a second referral.  Do they need a specific order to proceed with SI joint ablation?

## 2020-07-02 NOTE — Telephone Encounter (Signed)
Patient called stating that she would like to continue with the SI joint ablation. She also discussed this with the pain management doctor at [facility] who think this would be a good idea as well.  Can a referral be sent over for her?

## 2020-07-03 NOTE — Addendum Note (Signed)
Addended by: Judy Pimple R on: 07/03/2020 11:06 AM   Modules accepted: Orders

## 2020-07-03 NOTE — Telephone Encounter (Signed)
Called [facility] and informed them of the order and they will call and schedule patient

## 2020-07-03 NOTE — Telephone Encounter (Signed)
[facility] called back, the lady thought they did do that procedure but after checking they do not.I have called [facility] and left a voicemail to see if they do this. Called [facility] and they have one doctor that does this procedure and his name is Dr. Mina Marble. Let me know what you would like me to do. Thank you

## 2020-07-03 NOTE — Telephone Encounter (Signed)
I have received two calls stating Dr. Mina Marble at [facility] does the nerve ablations.changing referral and sending to [facility].

## 2020-07-03 NOTE — Telephone Encounter (Signed)
Referred to interventional radiology. I think this is the correct location within [facility].  Our office should call [phone] (IR clinic) to confirm this is the correct location for this referral. If not please let me know.

## 2020-07-05 ENCOUNTER — Encounter: Payer: Self-pay | Admitting: Internal Medicine

## 2020-07-05 DIAGNOSIS — M5416 Radiculopathy, lumbar region: Secondary | ICD-10-CM | POA: Diagnosis not present

## 2020-07-05 DIAGNOSIS — F419 Anxiety disorder, unspecified: Secondary | ICD-10-CM

## 2020-07-05 DIAGNOSIS — M797 Fibromyalgia: Secondary | ICD-10-CM | POA: Diagnosis not present

## 2020-07-05 DIAGNOSIS — G589 Mononeuropathy, unspecified: Secondary | ICD-10-CM | POA: Diagnosis not present

## 2020-07-05 DIAGNOSIS — G894 Chronic pain syndrome: Secondary | ICD-10-CM | POA: Diagnosis not present

## 2020-07-08 MED ORDER — ALPRAZOLAM 0.5 MG PO TABS: ORAL_TABLET | ORAL | 0 refills | Status: DC

## 2020-07-08 NOTE — Telephone Encounter (Signed)
ALPRAZolam (XANAX) 0.5 MG tablet  Patient calling regarding new prescription.Marland KitchenMarland Kitchen

## 2020-07-12 DIAGNOSIS — M533 Sacrococcygeal disorders, not elsewhere classified: Secondary | ICD-10-CM | POA: Diagnosis not present

## 2020-07-15 DIAGNOSIS — M79605 Pain in left leg: Secondary | ICD-10-CM | POA: Diagnosis not present

## 2020-07-15 DIAGNOSIS — R2689 Other abnormalities of gait and mobility: Secondary | ICD-10-CM | POA: Diagnosis not present

## 2020-07-15 DIAGNOSIS — M6281 Muscle weakness (generalized): Secondary | ICD-10-CM | POA: Diagnosis not present

## 2020-07-15 DIAGNOSIS — M25562 Pain in left knee: Secondary | ICD-10-CM | POA: Diagnosis not present

## 2020-07-15 DIAGNOSIS — M797 Fibromyalgia: Secondary | ICD-10-CM | POA: Diagnosis not present

## 2020-07-15 DIAGNOSIS — M545 Low back pain, unspecified: Secondary | ICD-10-CM | POA: Diagnosis not present

## 2020-07-15 DIAGNOSIS — M5417 Radiculopathy, lumbosacral region: Secondary | ICD-10-CM | POA: Diagnosis not present

## 2020-07-17 DIAGNOSIS — H353231 Exudative age-related macular degeneration, bilateral, with active choroidal neovascularization: Secondary | ICD-10-CM | POA: Diagnosis not present

## 2020-07-17 DIAGNOSIS — H35363 Drusen (degenerative) of macula, bilateral: Secondary | ICD-10-CM | POA: Diagnosis not present

## 2020-07-17 DIAGNOSIS — H5315 Visual distortions of shape and size: Secondary | ICD-10-CM | POA: Diagnosis not present

## 2020-07-17 DIAGNOSIS — H43391 Other vitreous opacities, right eye: Secondary | ICD-10-CM | POA: Diagnosis not present

## 2020-07-22 DIAGNOSIS — R2689 Other abnormalities of gait and mobility: Secondary | ICD-10-CM | POA: Diagnosis not present

## 2020-07-22 DIAGNOSIS — M6281 Muscle weakness (generalized): Secondary | ICD-10-CM | POA: Diagnosis not present

## 2020-07-22 DIAGNOSIS — M9903 Segmental and somatic dysfunction of lumbar region: Secondary | ICD-10-CM | POA: Diagnosis not present

## 2020-07-22 DIAGNOSIS — M545 Low back pain, unspecified: Secondary | ICD-10-CM | POA: Diagnosis not present

## 2020-07-22 DIAGNOSIS — M9905 Segmental and somatic dysfunction of pelvic region: Secondary | ICD-10-CM | POA: Diagnosis not present

## 2020-07-22 DIAGNOSIS — M9904 Segmental and somatic dysfunction of sacral region: Secondary | ICD-10-CM | POA: Diagnosis not present

## 2020-07-22 DIAGNOSIS — M25562 Pain in left knee: Secondary | ICD-10-CM | POA: Diagnosis not present

## 2020-07-22 DIAGNOSIS — M5417 Radiculopathy, lumbosacral region: Secondary | ICD-10-CM | POA: Diagnosis not present

## 2020-07-22 DIAGNOSIS — M797 Fibromyalgia: Secondary | ICD-10-CM | POA: Diagnosis not present

## 2020-07-22 DIAGNOSIS — M79605 Pain in left leg: Secondary | ICD-10-CM | POA: Diagnosis not present

## 2020-07-22 DIAGNOSIS — M5136 Other intervertebral disc degeneration, lumbar region: Secondary | ICD-10-CM | POA: Diagnosis not present

## 2020-07-25 DIAGNOSIS — M533 Sacrococcygeal disorders, not elsewhere classified: Secondary | ICD-10-CM | POA: Diagnosis not present

## 2020-07-28 ENCOUNTER — Other Ambulatory Visit: Payer: Self-pay | Admitting: Internal Medicine

## 2020-07-30 DIAGNOSIS — M5416 Radiculopathy, lumbar region: Secondary | ICD-10-CM | POA: Diagnosis not present

## 2020-08-06 ENCOUNTER — Other Ambulatory Visit: Payer: Self-pay | Admitting: Internal Medicine

## 2020-08-06 ENCOUNTER — Encounter: Payer: Self-pay | Admitting: Internal Medicine

## 2020-08-06 DIAGNOSIS — Z79899 Other long term (current) drug therapy: Secondary | ICD-10-CM | POA: Diagnosis not present

## 2020-08-06 DIAGNOSIS — G589 Mononeuropathy, unspecified: Secondary | ICD-10-CM | POA: Diagnosis not present

## 2020-08-06 DIAGNOSIS — F419 Anxiety disorder, unspecified: Secondary | ICD-10-CM

## 2020-08-06 DIAGNOSIS — M5416 Radiculopathy, lumbar region: Secondary | ICD-10-CM | POA: Diagnosis not present

## 2020-08-07 ENCOUNTER — Other Ambulatory Visit: Payer: Self-pay | Admitting: Internal Medicine

## 2020-08-07 ENCOUNTER — Encounter: Payer: Self-pay | Admitting: Internal Medicine

## 2020-08-08 DIAGNOSIS — G894 Chronic pain syndrome: Secondary | ICD-10-CM | POA: Diagnosis not present

## 2020-08-08 DIAGNOSIS — M5416 Radiculopathy, lumbar region: Secondary | ICD-10-CM | POA: Diagnosis not present

## 2020-08-08 DIAGNOSIS — M533 Sacrococcygeal disorders, not elsewhere classified: Secondary | ICD-10-CM | POA: Diagnosis not present

## 2020-08-08 DIAGNOSIS — G8929 Other chronic pain: Secondary | ICD-10-CM | POA: Diagnosis not present

## 2020-08-15 DIAGNOSIS — M5416 Radiculopathy, lumbar region: Secondary | ICD-10-CM | POA: Diagnosis not present

## 2020-08-16 ENCOUNTER — Encounter: Payer: Self-pay | Admitting: Internal Medicine

## 2020-08-26 ENCOUNTER — Encounter: Payer: Self-pay | Admitting: Internal Medicine

## 2020-08-27 MED ORDER — BENZONATATE 200 MG PO CAPS
ORAL_CAPSULE | ORAL | 1 refills | Status: DC
Start: 2020-08-27 — End: 2020-10-17

## 2020-08-27 NOTE — Progress Notes (Addendum)
Office Visit Note  Patient: Laura Barnes             Date of Birth: [birth year]           MRN: [id number]             PCP: Binnie Rail, MD Referring: Binnie Rail, MD Visit Date: 08/28/2020 Occupation: @GUAROCC @  Subjective:  Other (Left hip pain )   History of Present Illness: Laura Barnes is a 80 y.o. female with a history of degenerative disc disease and osteoarthritis.  She states she continues to have some discomfort in her hands and also her left shoulder.  She states she was having a lot of lower back pain radiating to her left lower extremity.  She had nerve block 17 March which helped the radiculopathy.  She had been going to physical therapy.  She states during her last visit at physical therapy she started having severe pain after the physical therapy in her left piriformis region.  She was advised from physical therapy to get piriformis injection.  She continues to have discomfort in that region and has difficulty sitting on the chair.  She continues to have some generalized pain and discomfort from fibromyalgia.  She has been taking calcium and vitamin D for osteopenia.  Activities of Daily Living:  Patient reports morning stiffness for 30-60  minutes.   Patient Reports nocturnal pain.  Difficulty dressing/grooming: Denies Difficulty climbing stairs: Reports Difficulty getting out of chair: Denies Difficulty using hands for taps, buttons, cutlery, and/or writing: Reports  Review of Systems  Constitutional: Positive for fatigue. Negative for night sweats, weight gain and weight loss.  HENT: Positive for mouth dryness. Negative for mouth sores, trouble swallowing, trouble swallowing and nose dryness.   Eyes: Positive for dryness. Negative for pain, redness, itching and visual disturbance.  Respiratory: Negative for cough, shortness of breath and difficulty breathing.   Cardiovascular: Negative for chest pain, palpitations, hypertension, irregular heartbeat  and swelling in legs/feet.  Gastrointestinal: Negative for blood in stool, constipation and diarrhea.  Endocrine: Negative for increased urination.  Genitourinary: Negative for difficulty urinating and vaginal dryness.  Musculoskeletal: Positive for arthralgias, joint pain, myalgias, morning stiffness, muscle tenderness and myalgias. Negative for joint swelling and muscle weakness.  Skin: Negative for color change, rash, hair loss, redness, skin tightness, ulcers and sensitivity to sunlight.  Allergic/Immunologic: Positive for susceptible to infections.  Neurological: Positive for headaches. Negative for dizziness, numbness, memory loss, night sweats and weakness.  Hematological: Positive for bruising/bleeding tendency. Negative for swollen glands.  Psychiatric/Behavioral: Negative for depressed mood, confusion and sleep disturbance. The patient is nervous/anxious.     PMFS History:  Patient Active Problem List   Diagnosis Date Noted  . Aortic atherosclerosis ([place]) 03/29/2020  . 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 Scoliosis of thoracolumbar spine 07/11/2019  . Choking 03/24/2019  . Hypokalemia 03/24/2019  . Fatigue 12/31/2018  . Epistaxis, recurrent 08/08/2018  . Degenerative arthritis of knee, bilateral 07/05/2018  . Protrusion of lumbar intervertebral disc 04/20/2018  . Difficulty urinating 11/24/2017  . Dizziness 08/11/2017  . Sacroiliac pain 06/17/2017  . Greater trochanteric bursitis of right hip 05/27/2017  . Vertigo 04/29/2017  . Dysphagia 10/28/2016  . Lump of skin 05/11/2016  . 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 Chronic meniscal tear of knee 03/05/2016  . Nausea 03/04/2016  . Osteopenia 12/03/2015  . Thyroid nodule 11/16/2015  . Bilateral leg edema 11/05/2015  . Anterior neck pain 11/05/2015  .  Hormone replacement therapy (HRT) 11/05/2015  . Subacromial bursitis 09/16/2015  . Spondylosis 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 myelopathy or radiculopathy  03/12/2015  . Trochanteric bursitis of both hips 03/12/2015  . Subacromial impingement of left shoulder 03/12/2015  . Hyperlipidemia 05/01/2014  . Chronic pain syndrome 05/01/2014  . Lumbar radiculopathy 03/27/2014  . Left shoulder pain 11/07/2013  . Degeneration of intervertebral disc of cervical region 10/10/2013  . Fibromyalgia 09/06/2013  . Bilateral shoulder pain 09/06/2013  . Chronic cough 04/12/2013  . Sinusitis, chronic 04/12/2013  . Hypothyroidism 04/07/2012  . Chronic venous insufficiency 04/02/2010  . Enthesopathy of hip region 07/19/2007  . Osteoporosis 07/19/2007  . Anxiety 01/23/2007  . Depression 01/23/2007  . Migraine 01/23/2007  . NINAR (noninfectious nonallergic rhinitis) 01/23/2007  . Gastroesophageal reflux disease 01/23/2007  . IBS 01/23/2007    Past Medical History:  Diagnosis Date  . ALLERGIC RHINITIS   . Allergy   . Anemia   . ANXIETY   . Arthritis    hands  . Bronchitis   . Chronic fatigue fibromyalgia syndrome   . Complication of anesthesia    says one time waking up she couldn't breathe, felt like her throat closing up  . DEPRESSION   . Enthesopathy of hip region   . Family history of anesthesia complication    sister with n/v  . FOOT PAIN, BILATERAL   . GERD   . Hiatal hernia   . HYPERLIPIDEMIA   . Hypothyroidism   . IBS   . MIGRAINE HEADACHE   . MIGRAINE, COMMON   . NECK MASS   . OSTEOPENIA   . OSTEOPOROSIS   . PLANTAR FASCIITIS   . RASH-NONVESICULAR   . SINUSITIS- ACUTE-NOS   . SKIN LESION   . VAGINITIS   . VENOUS INSUFFICIENCY, CHRONIC     Family History  Problem Relation Age of Onset  . Diabetes Mother   . Heart disease Father   . Diabetes Father   . Diabetes Sister   . Breast cancer Maternal Grandmother   . Heart disease Maternal Uncle        x 2  . Heart disease Maternal Aunt   . Kidney disease Paternal Uncle        questionable  . Heart disease Son   . Healthy Son   . Irritable bowel syndrome Son   . Colon  cancer Neg Hx   . Colon polyps Neg Hx   . Rectal cancer Neg Hx   . Stomach cancer Neg Hx    Past Surgical History:  Procedure Laterality Date  . APPENDECTOMY    . BREAST ENHANCEMENT SURGERY    . BREAST IMPLANT REMOVAL    . CHOLECYSTECTOMY    . COLONOSCOPY    . FOOT SURGERY  11/06/2016  . MASTECTOMY     bilateral for severe bilateral fibrocystic disease  . OOPHORECTOMY    . s/p neck lump removal    . SHOULDER SURGERY    . SINUS ENDO W/FUSION Right 06/30/2013   Procedure: RIGHT ENDOSCOPIC SPHENOIDECTOMY WITH FUSION SCAN;  Surgeon: [name], MD;  Location: [facility];  Service: ENT;  Laterality: Right;  . TONSILLECTOMY    . VAGINAL HYSTERECTOMY     Social History   Social History Narrative   Has 2 biological children and 1 step child   Immunization History  Administered Date(s) Administered  . Fluad Quad(high Dose 65+) 01/18/2019, 03/29/2020  . Influenza Split 04/07/2011, 03/08/2012  . Influenza Whole 03/20/2008, 04/01/2009, 04/02/2010  . Influenza, High  Dose Seasonal PF 04/12/2013, 03/26/2015, 03/04/2016, 04/01/2017  . Influenza,inj,Quad PF,6+ Mos 05/01/2014  . Influenza-Unspecified 04/01/2018  . Pneumococcal Conjugate-13 05/02/2013  . Pneumococcal Polysaccharide-23 01/25/2006  . Td 09/17/2008  . Tdap 03/24/2019  . Zoster 01/25/2006  . Zoster Recombinat (Shingrix) 08/18/2017, 12/06/2017     Objective: Vital Signs: BP (!) 151/94 (BP Location: Left Arm, Patient Position: Sitting, Cuff Size: Normal)   Pulse (!) 112   Resp 16   Ht 4' 11.75" (1.518 m)   Wt 116 lb 6.4 oz (52.8 kg)   BMI 22.92 kg/m    Physical Exam Vitals and nursing note reviewed.  Constitutional:      Appearance: She is well-developed.  HENT:     Head: Normocephalic and atraumatic.  Eyes:     Conjunctiva/sclera: Conjunctivae normal.  Cardiovascular:     Rate and Rhythm: Normal rate and regular rhythm.     Heart sounds: Normal heart sounds.  Pulmonary:     Effort: Pulmonary effort is normal.      Breath sounds: Normal breath sounds.  Abdominal:     General: Bowel sounds are normal.     Palpations: Abdomen is soft.  Musculoskeletal:     Cervical back: Normal range of motion.  Lymphadenopathy:     Cervical: No cervical adenopathy.  Skin:    General: Skin is warm and dry.     Capillary Refill: Capillary refill takes less than 2 seconds.  Neurological:     Mental Status: She is alert and oriented to person, place, and time.  Psychiatric:        Behavior: Behavior normal.      Musculoskeletal Exam: She has limited range of motion of her cervical thoracic and lumbar spine.  Shoulder joints, elbow joints, wrist joints with good range of motion.  She has bilateral PIP and DIP thickening with no synovitis.  Right hip is higher than her left hip.  She had tenderness over left piriformis region.  She had tenderness over bilateral trochanteric bursa.  She had good range of motion of her knee joints with some crepitus without any warmth swelling or effusion.  CDAI Exam: CDAI Score: -- Patient Global: --; Provider Global: -- Swollen: --; Tender: -- Joint Exam 08/28/2020   No joint exam has been documented for this visit   There is currently no information documented on the homunculus. Go to the Rheumatology activity and complete the homunculus joint exam.  Investigation: No additional findings.  Imaging: No results found.  Recent Labs: Lab Results  Component Value Date   WBC 8.5 03/29/2020   HGB 14.6 03/29/2020   PLT 298.0 03/29/2020   NA 132 (L) 03/29/2020   K 3.7 03/29/2020   CL 95 (L) 03/29/2020   CO2 28 03/29/2020   GLUCOSE 93 03/29/2020   BUN 9 03/29/2020   CREATININE 0.66 03/29/2020   BILITOT 0.6 03/29/2020   ALKPHOS 34 (L) 03/29/2020   AST 27 03/29/2020   ALT 22 03/29/2020   PROT 7.9 03/29/2020   ALBUMIN 4.5 03/29/2020   CALCIUM 10.4 03/29/2020   GFRAA >60 04/23/2017    Speciality Comments: No specialty comments available.  Procedures:  Trigger  Point Inj  Date/Time: 08/28/2020 1:50 PM Performed by: Bo Merino, MD Authorized by: Bo Merino, MD   Consent Given by:  Patient Site marked: the procedure site was marked   Timeout: prior to procedure the correct patient, procedure, and site was verified   Indications:  Muscle spasm and pain Total # of Trigger Points:  1 Location: neck and lower extremity   Needle Size:  27 G Approach:  Dorsal Medications #1:  1 mL lidocaine 1 %; 40 mg triamcinolone acetonide 40 MG/ML Patient tolerance:  Patient tolerated the procedure well with no immediate complications   Allergies: Prednisone, Amoxicillin-pot clavulanate, Aspirin, Erythromycin, Levofloxacin, Nsaids, Statins, Sumatriptan, Adhesive [tape], Ivp dye [iodinated diagnostic agents], Lyrica [pregabalin], Methylphenidate hcl, Mirtazapine, Oxycodone, Red yeast rice [cholestin], Shellfish allergy, Soy allergy, Doxycycline, Hydrocodone, Hydrocodone-acetaminophen, Latex, Rofecoxib, Sertraline hcl, and Sulfonamide derivatives   Assessment / Plan:     Visit Diagnoses: Primary osteoarthritis of both hands-she has DIP and PIP thickening with no synovitis.  She continues to have discomfort in her hands.  Joint protection muscle strengthening was discussed.  Subacromial impingement of left shoulder-she has off-and-on discomfort.  She also had left trapezius spasm.  Trochanteric bursitis of both hips-IT band stretches were discussed.  It is difficult for her to perform exercises due to lower back pain currently.  DDD (degenerative disc disease), cervical-she had limited range of motion.  DDD (degenerative disc disease), lumbar -she recently had nerve block which helped with her left-sided radiculopathy.  Fibromyalgia-she continues to have generalized pain and discomfort.  Need for regular's exercise and stretching was discussed.  Chronic pain syndrome - followed by pain management.  Piriformis syndrome of left side-she had tenderness  over the left piriformis region which started recently after the physical therapy.  She has tried physical therapy without much response.  She requested a cortisone injection.  She stated that she had good response with the cortisone injection in the past.  Per patient's request piriformis region was injected as described above.  She tolerated the procedure well.  I offered x-rays but she stated that she will get x-rays at Dr. Jodi Mourning office.  Osteopenia of multiple sites - DEXA on 03/15/18: RFN -1.2, LFN -1.2, and lumbar spine -1.2.  Patient will get repeat DEXA by Dr. Quay Burow.  She is on calcium and vitamin D.  I advised resistive exercises.  Other medical problems are listed as follows:  Chronic venous insufficiency  Anxiety and depression  Hormone replacement therapy (HRT)  History of IBS  Thyroid nodule  History of gastroesophageal reflux (GERD)  History of hyperlipidemia  Hypothyroidism, unspecified type  Orders: Orders Placed This Encounter  Procedures  . Trigger Point Inj   No orders of the defined types were placed in this encounter.    Follow-Up Instructions: Return in about 6 months (around 02/28/2021) for Osteoarthritis.   Bo Merino, MD  Note - This record has been created using Editor, commissioning.  Chart creation errors have been sought, but may not always  have been located. Such creation errors do not reflect on  the standard of medical care.

## 2020-08-28 ENCOUNTER — Ambulatory Visit: Payer: Medicare Other | Admitting: Rheumatology

## 2020-08-28 ENCOUNTER — Encounter: Payer: Self-pay | Admitting: Rheumatology

## 2020-08-28 ENCOUNTER — Other Ambulatory Visit: Payer: Self-pay

## 2020-08-28 VITALS — BP 151/94 | HR 112 | Resp 16 | Ht 59.75 in | Wt 116.4 lb

## 2020-08-28 DIAGNOSIS — Z8719 Personal history of other diseases of the digestive system: Secondary | ICD-10-CM

## 2020-08-28 DIAGNOSIS — Z8639 Personal history of other endocrine, nutritional and metabolic disease: Secondary | ICD-10-CM

## 2020-08-28 DIAGNOSIS — M7542 Impingement syndrome of left shoulder: Secondary | ICD-10-CM

## 2020-08-28 DIAGNOSIS — F32A Depression, unspecified: Secondary | ICD-10-CM

## 2020-08-28 DIAGNOSIS — M7061 Trochanteric bursitis, right hip: Secondary | ICD-10-CM | POA: Diagnosis not present

## 2020-08-28 DIAGNOSIS — M19042 Primary osteoarthritis, left hand: Secondary | ICD-10-CM

## 2020-08-28 DIAGNOSIS — M5030 Other cervical disc degeneration, unspecified cervical region: Secondary | ICD-10-CM

## 2020-08-28 DIAGNOSIS — M5136 Other intervertebral disc degeneration, lumbar region: Secondary | ICD-10-CM | POA: Diagnosis not present

## 2020-08-28 DIAGNOSIS — Z79890 Hormone replacement therapy (postmenopausal): Secondary | ICD-10-CM

## 2020-08-28 DIAGNOSIS — F419 Anxiety disorder, unspecified: Secondary | ICD-10-CM

## 2020-08-28 DIAGNOSIS — E041 Nontoxic single thyroid nodule: Secondary | ICD-10-CM

## 2020-08-28 DIAGNOSIS — M797 Fibromyalgia: Secondary | ICD-10-CM | POA: Diagnosis not present

## 2020-08-28 DIAGNOSIS — G894 Chronic pain syndrome: Secondary | ICD-10-CM | POA: Diagnosis not present

## 2020-08-28 DIAGNOSIS — G5702 Lesion of sciatic nerve, left lower limb: Secondary | ICD-10-CM | POA: Diagnosis not present

## 2020-08-28 DIAGNOSIS — M19041 Primary osteoarthritis, right hand: Secondary | ICD-10-CM | POA: Diagnosis not present

## 2020-08-28 DIAGNOSIS — M8589 Other specified disorders of bone density and structure, multiple sites: Secondary | ICD-10-CM | POA: Diagnosis not present

## 2020-08-28 DIAGNOSIS — E039 Hypothyroidism, unspecified: Secondary | ICD-10-CM

## 2020-08-28 DIAGNOSIS — I872 Venous insufficiency (chronic) (peripheral): Secondary | ICD-10-CM

## 2020-08-28 DIAGNOSIS — M7062 Trochanteric bursitis, left hip: Secondary | ICD-10-CM

## 2020-08-28 MED ORDER — LIDOCAINE HCL 1 % IJ SOLN
1.0000 mL | INTRAMUSCULAR | Status: AC | PRN
  Administered 2020-08-28: 1 mL

## 2020-08-28 MED ORDER — TRIAMCINOLONE ACETONIDE 40 MG/ML IJ SUSP
40.0000 mg | INTRAMUSCULAR | Status: AC | PRN
  Administered 2020-08-28: 40 mg via INTRAMUSCULAR

## 2020-08-29 ENCOUNTER — Telehealth: Payer: Self-pay

## 2020-08-29 NOTE — Telephone Encounter (Signed)
Patient called stating she had an appointment with Dr. Estanislado Pandy yesterday and when she went on her MyChart she noticed under Test Results it states patient was offered x-rays which she refused.  Patient states "she never refused x-rays" Dr. Estanislado Pandy never offered them.  She states Dr. Estanislado Pandy told her she could have Dr. Jacelyn Grip do the x-rays.  Patient states "I want Dr. Estanislado Pandy to remove that I refused x-rays from the office visit note."  Patient states she spoke with Dr. Jodi Mourning assistant [name] who stated they need a request from Dr. Estanislado Pandy to do the x-rays.  Patient states the piriformis injection has helped.

## 2020-08-29 NOTE — Telephone Encounter (Signed)
Please advise 

## 2020-08-29 NOTE — Telephone Encounter (Signed)
I addend the office note. Dr. Jacelyn Grip can order the x-rays or she can get at the [facility]. We may send the order to either location of patient's choice.

## 2020-08-30 NOTE — Telephone Encounter (Signed)
I called patient, patient has appt coming up with Dr. Jacelyn Grip, patient will have Dr. Jacelyn Grip order her x-rays.

## 2020-09-03 ENCOUNTER — Telehealth: Payer: Self-pay | Admitting: Internal Medicine

## 2020-09-03 NOTE — Progress Notes (Signed)
  Chronic Care Management   Note  09/03/2020 Name: Laura Barnes MRN: [id number] DOB: April 18, 1941  Laura Barnes is a 80 y.o. year old female who is a primary care patient of Burns, Claudina Lick, MD. I reached out to [name] by phone today in response to a referral sent by Laura Barnes PCP, Binnie Rail, MD.   Laura Barnes was given information about Chronic Care Management services today including:  1. CCM service includes personalized support from designated clinical staff supervised by her physician, including individualized plan of care and coordination with other care providers 2. 24/7 contact phone numbers for assistance for urgent and routine care needs. 3. Service will only be billed when office clinical staff spend 20 minutes or more in a month to coordinate care. 4. Only one practitioner may furnish and bill the service in a calendar month. 5. The patient may stop CCM services at any time (effective at the end of the month) by phone call to the office staff.   Patient agreed to services and verbal consent obtained.   Follow up plan:   Laura Barnes [employer] Scheduler

## 2020-09-03 NOTE — Progress Notes (Signed)
  Chronic Care Management   Note  09/03/2020 Name: Laura Barnes MRN: [id number] DOB: 1940-11-18  Laura Barnes is a 80 y.o. year old female who is a primary care patient of Burns, Claudina Lick, MD. I reached out to [name] by phone today in response to a referral sent by Laura Barnes's PCP, Binnie Rail, MD.   [name] was given information about Chronic Care Management services today including:  1. CCM service includes personalized support from designated clinical staff supervised by her physician, including individualized plan of care and coordination with other care providers 2. 24/7 contact phone numbers for assistance for urgent and routine care needs. 3. Service will only be billed when office clinical staff spend 20 minutes or more in a month to coordinate care. 4. Only one practitioner may furnish and bill the service in a calendar month. 5. The patient may stop CCM services at any time (effective at the end of the month) by phone call to the office staff.   Patient agreed to services and verbal consent obtained.   Follow up plan:   Carley Perdue [employer] Scheduler

## 2020-09-06 ENCOUNTER — Encounter: Payer: Self-pay | Admitting: Internal Medicine

## 2020-09-06 DIAGNOSIS — F419 Anxiety disorder, unspecified: Secondary | ICD-10-CM

## 2020-09-09 MED ORDER — ALPRAZOLAM 0.5 MG PO TABS: ORAL_TABLET | ORAL | 0 refills | Status: DC

## 2020-09-14 ENCOUNTER — Other Ambulatory Visit: Payer: Self-pay | Admitting: Internal Medicine

## 2020-09-14 DIAGNOSIS — F419 Anxiety disorder, unspecified: Secondary | ICD-10-CM

## 2020-09-24 DIAGNOSIS — M5416 Radiculopathy, lumbar region: Secondary | ICD-10-CM | POA: Diagnosis not present

## 2020-09-24 DIAGNOSIS — Z9181 History of falling: Secondary | ICD-10-CM | POA: Diagnosis not present

## 2020-09-24 DIAGNOSIS — Z20822 Contact with and (suspected) exposure to covid-19: Secondary | ICD-10-CM | POA: Diagnosis not present

## 2020-09-24 DIAGNOSIS — Z6823 Body mass index (BMI) 23.0-23.9, adult: Secondary | ICD-10-CM | POA: Diagnosis not present

## 2020-09-24 DIAGNOSIS — M797 Fibromyalgia: Secondary | ICD-10-CM | POA: Diagnosis not present

## 2020-09-24 DIAGNOSIS — Z79899 Other long term (current) drug therapy: Secondary | ICD-10-CM | POA: Diagnosis not present

## 2020-09-24 DIAGNOSIS — Z131 Encounter for screening for diabetes mellitus: Secondary | ICD-10-CM | POA: Diagnosis not present

## 2020-09-24 DIAGNOSIS — E559 Vitamin D deficiency, unspecified: Secondary | ICD-10-CM | POA: Diagnosis not present

## 2020-09-24 DIAGNOSIS — G589 Mononeuropathy, unspecified: Secondary | ICD-10-CM | POA: Diagnosis not present

## 2020-09-24 DIAGNOSIS — G894 Chronic pain syndrome: Secondary | ICD-10-CM | POA: Diagnosis not present

## 2020-09-26 NOTE — Progress Notes (Signed)
Subjective:    Patient ID: Laura Barnes, female    DOB: 15-Jan-1941, 80 y.o.   MRN: [id number]  HPI The patient is here for follow up of their chronic medical problems, including aortic atherosclerosis, , anxiety, depression, fibromyalgia, HRT, leg swelling  Her insurance will cover a HHA for her.      Medications and allergies reviewed with patient and updated if appropriate.  Patient Active Problem List   Diagnosis Date Noted  . Aortic atherosclerosis ([place]) 03/29/2020  . 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 Scoliosis of thoracolumbar spine 07/11/2019  . Choking 03/24/2019  . Hypokalemia 03/24/2019  . Fatigue 12/31/2018  . Epistaxis, recurrent 08/08/2018  . Degenerative arthritis of knee, bilateral 07/05/2018  . Protrusion of lumbar intervertebral disc 04/20/2018  . Difficulty urinating 11/24/2017  . Dizziness 08/11/2017  . Sacroiliac pain 06/17/2017  . Greater trochanteric bursitis of right hip 05/27/2017  . Vertigo 04/29/2017  . Dysphagia 10/28/2016  . Lump of skin 05/11/2016  . 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 Chronic meniscal tear of knee 03/05/2016  . Nausea 03/04/2016  . Osteopenia 12/03/2015  . Thyroid nodule 11/16/2015  . Bilateral leg edema 11/05/2015  . Anterior neck pain 11/05/2015  . Hormone replacement therapy (HRT) 11/05/2015  . Subacromial bursitis 09/16/2015  . 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 Trochanteric bursitis of both hips 03/12/2015  . Subacromial impingement of left shoulder 03/12/2015  . Hyperlipidemia 05/01/2014  . Chronic pain syndrome 05/01/2014  . Lumbar radiculopathy 03/27/2014  . Left shoulder pain 11/07/2013  . Degeneration of intervertebral disc of cervical region 10/10/2013  . Fibromyalgia 09/06/2013  . Bilateral shoulder pain 09/06/2013  . Chronic cough 04/12/2013  . Sinusitis, chronic 04/12/2013  . Hypothyroidism 04/07/2012  . Chronic venous  insufficiency 04/02/2010  . Enthesopathy of hip region 07/19/2007  . Osteoporosis 07/19/2007  . Anxiety 01/23/2007  . Depression 01/23/2007  . Migraine 01/23/2007  . NINAR (noninfectious nonallergic rhinitis) 01/23/2007  . Gastroesophageal reflux disease 01/23/2007  . IBS 01/23/2007    Current Outpatient Medications on File Prior to Visit  Medication Sig Dispense Refill  . acetaminophen (TYLENOL) 500 MG tablet Take 1,000 mg by mouth every 6 (six) hours as needed for mild pain, moderate pain, fever or headache.    . ALPRAZolam (XANAX) 0.5 MG tablet TAKE 1 TABLET(0.5 MG) BY MOUTH THREE TIMES DAILY AS NEEDED FOR ANXIETY 90 tablet 0  . Amino Acids (AMINO ACID PO) Take 1 capsule by mouth 2 (two) times daily.    . Ascorbic Acid (VITAMIN C) 1000 MG tablet Take 1,000 mg by mouth 2 (two) times daily.    . B Complex-Biotin-FA (B COMPLETE) TABS Take 1 tablet by mouth daily. B Complete 100mg     . benzonatate (TESSALON) 100 MG capsule Take by mouth.    . benzonatate (TESSALON) 200 MG capsule TAKE 1 CAPSULE(200 MG) BY MOUTH THREE TIMES DAILY AS NEEDED 90 capsule 1  . buPROPion (WELLBUTRIN XL) 150 MG 24 hr tablet TAKE 1 TABLET(150 MG) BY MOUTH EVERY MORNING.  FOR TOTAL OF 450 MG (Patient taking differently: TAKE 1 TABLET(150 MG) BY MOUTH EVERY MORNING AS NEEDED  FOR TOTAL OF 450 MG) 90 tablet 1  . buPROPion (WELLBUTRIN XL) 300 MG 24 hr tablet TAKE 1 TABLET(300 MG) BY MOUTH DAILY 90 tablet 1  . Cholecalciferol (VITAMIN D3) 1000 units CAPS Take 1,000 Units by mouth daily.    . Coenzyme Q10 (CO Q-10) 200 MG  CAPS Take 1 capsule by mouth 2 (two) times daily.    Marland Kitchen ECHINACEA PO Take 1 tablet by mouth 2 (two) times daily.    Marland Kitchen ELDERBERRY PO Take by mouth daily.    Marland Kitchen estradiol (ESTRACE) 0.5 MG tablet TAKE 1 TABLET(0.5 MG) BY MOUTH DAILY 90 tablet 1  . fluticasone (FLONASE) 50 MCG/ACT nasal spray Place 2 sprays into both nostrils as needed.     Nyoka Cowden Tea, Camellia sinensis, POWD Take 1 capsule by mouth daily.     . Homeopathic Products (ARNICA [place]) PLLT Take by mouth daily.    Marland Kitchen KRILL OIL PO Take by mouth daily.    Marland Kitchen LECITHIN PO Take 1 tablet by mouth daily.    Marland Kitchen levothyroxine (SYNTHROID) 75 MCG tablet TAKE 1 TABLET BY MOUTH EVERY DAY IN THE MORNING 90 tablet 1  . MAGNESIUM ASPARTATE PO Take 1 tablet by mouth 2 (two) times daily.    . meclizine (ANTIVERT) 25 MG tablet Take 1 tablet (25 mg total) by mouth 3 (three) times daily as needed for dizziness. 60 tablet 5  . Misc. Devices (ROLLATOR) MISC Use rollator for ambulation 1 each 0  . Multiple Vitamins-Minerals (ZINC PO) Take by mouth daily.    . naloxone (NARCAN) nasal spray 4 mg/0.1 mL SMARTSIG:Both Nares    . ondansetron (ZOFRAN) 4 MG tablet TAKE 1 TABLET(4 MG) BY MOUTH EVERY 8 HOURS AS NEEDED FOR NAUSEA OR VOMITING 30 tablet 0  . ondansetron (ZOFRAN-ODT) 8 MG disintegrating tablet DISSOLVE 1 TABLET(8 MG) ON THE TONGUE EVERY 8 HOURS AS NEEDED FOR NAUSEA OR VOMITING 30 tablet 2  . oxyCODONE-acetaminophen (PERCOCET) 10-325 MG tablet Take 1 tablet by mouth 4 (four) times daily as needed.    Marland Kitchen POTASSIUM AMINOBENZOATE PO Take 1 tablet by mouth daily.    Marland Kitchen triamterene-hydrochlorothiazide (MAXZIDE-25) 37.5-25 MG tablet TAKE 1 TABLET BY MOUTH DAILY 90 tablet 1  . TURMERIC PO Take 1 tablet by mouth 2 (two) times daily.     . vitamin E 400 UNIT capsule Take 400 Units by mouth daily.     No current facility-administered medications on file prior to visit.    Past Medical History:  Diagnosis Date  . ALLERGIC RHINITIS   . Allergy   . Anemia   . ANXIETY   . Arthritis    hands  . Bronchitis   . Chronic fatigue fibromyalgia syndrome   . Complication of anesthesia    says one time waking up she couldn't breathe, felt like her throat closing up  . DEPRESSION   . Enthesopathy of hip region   . Family history of anesthesia complication    sister with n/v  . FOOT PAIN, BILATERAL   . GERD   . Hiatal hernia   . HYPERLIPIDEMIA   . Hypothyroidism   .  IBS   . MIGRAINE HEADACHE   . MIGRAINE, COMMON   . NECK MASS   . OSTEOPENIA   . OSTEOPOROSIS   . PLANTAR FASCIITIS   . RASH-NONVESICULAR   . SINUSITIS- ACUTE-NOS   . SKIN LESION   . VAGINITIS   . VENOUS INSUFFICIENCY, CHRONIC     Past Surgical History:  Procedure Laterality Date  . APPENDECTOMY    . BREAST ENHANCEMENT SURGERY    . BREAST IMPLANT REMOVAL    . CHOLECYSTECTOMY    . COLONOSCOPY    . FOOT SURGERY  11/06/2016  . MASTECTOMY     bilateral for severe bilateral fibrocystic disease  . OOPHORECTOMY    .  s/p neck lump removal    . SHOULDER SURGERY    . SINUS ENDO W/FUSION Right 06/30/2013   Procedure: RIGHT ENDOSCOPIC SPHENOIDECTOMY WITH FUSION SCAN;  Surgeon: [name], MD;  Location: [facility];  Service: ENT;  Laterality: Right;  . TONSILLECTOMY    . VAGINAL HYSTERECTOMY      Social History   Socioeconomic History  . Marital status: Widowed    Spouse name: Not on file  . Number of children: 2  . Years of education: Not on file  . Highest education level: Not on file  Occupational History  . Occupation: retired Press photographer  Tobacco Use  . Smoking status: Never Smoker  . Smokeless tobacco: Never Used  Vaping Use  . Vaping Use: Never used  Substance and Sexual Activity  . Alcohol use: No  . Drug use: No  . Sexual activity: Not Currently  Other Topics Concern  . Not on file  Social History Narrative   Has 2 biological children and 1 step child   Social Determinants of Health   Financial Resource Strain: Not on file  Food Insecurity: Not on file  Transportation Needs: Not on file  Physical Activity: Not on file  Stress: Not on file  Social Connections: Not on file    Family History  Problem Relation Age of Onset  . Diabetes Mother   . Heart disease Father   . Diabetes Father   . Diabetes Sister   . Breast cancer Maternal Grandmother   . Heart disease Maternal Uncle        x 2  . Heart disease Maternal Aunt   . Kidney disease Paternal Uncle         questionable  . Heart disease Son   . Healthy Son   . Irritable bowel syndrome Son   . Colon cancer Neg Hx   . Colon polyps Neg Hx   . Rectal cancer Neg Hx   . Stomach cancer Neg Hx     Review of Systems  Constitutional: Negative for chills and fever.  Respiratory: Positive for cough (chronic ). Negative for shortness of breath and wheezing.   Cardiovascular: Positive for leg swelling (mild). Negative for chest pain and palpitations.  Musculoskeletal: Positive for arthralgias and back pain.  Neurological: Positive for headaches. Negative for light-headedness.  Psychiatric/Behavioral: Positive for dysphoric mood. The patient is nervous/anxious.        Objective:   Vitals:   09/27/20 1333  BP: 138/82  Pulse: 97  Temp: 98.2 F (36.8 C)  SpO2: 98%   BP Readings from Last 3 Encounters:  09/27/20 138/82  08/28/20 (!) 151/94  06/24/20 (!) 160/92   Wt Readings from Last 3 Encounters:  09/27/20 117 lb 12.8 oz (53.4 kg)  08/28/20 116 lb 6.4 oz (52.8 kg)  06/24/20 120 lb (54.4 kg)   Body mass index is 23.2 kg/m.   Physical Exam    Constitutional: Appears well-developed and well-nourished. No distress.  HENT:  Head: Normocephalic and atraumatic.  Neck: Neck supple. No tracheal deviation present. No thyromegaly present.  No cervical lymphadenopathy Cardiovascular: Normal rate, regular rhythm and normal heart sounds.   No murmur heard. No carotid bruit .  No edema Pulmonary/Chest: Effort normal and breath sounds normal. No respiratory distress. No has no wheezes. No rales.  Skin: Skin is warm and dry. Not diaphoretic.  Psychiatric: Normal mood and affect. Behavior is normal.      Assessment & Plan:    See Problem List for Assessment  and Plan of chronic medical problems.    This visit occurred during the SARS-CoV-2 public health emergency.  Safety protocols were in place, including screening questions prior to the visit, additional usage of staff PPE, and  extensive cleaning of exam room while observing appropriate contact time as indicated for disinfecting solutions.

## 2020-09-26 NOTE — Patient Instructions (Addendum)
   Medications changes include :   none    Please followup in 6 months   

## 2020-09-27 ENCOUNTER — Ambulatory Visit (INDEPENDENT_AMBULATORY_CARE_PROVIDER_SITE_OTHER): Payer: Medicare Other | Admitting: Internal Medicine

## 2020-09-27 ENCOUNTER — Encounter: Payer: Self-pay | Admitting: Internal Medicine

## 2020-09-27 ENCOUNTER — Other Ambulatory Visit: Payer: Self-pay

## 2020-09-27 VITALS — BP 138/82 | HR 97 | Temp 98.2°F | Ht 59.75 in | Wt 117.8 lb

## 2020-09-27 DIAGNOSIS — F3289 Other specified depressive episodes: Secondary | ICD-10-CM

## 2020-09-27 DIAGNOSIS — M797 Fibromyalgia: Secondary | ICD-10-CM | POA: Diagnosis not present

## 2020-09-27 DIAGNOSIS — F419 Anxiety disorder, unspecified: Secondary | ICD-10-CM | POA: Diagnosis not present

## 2020-09-27 DIAGNOSIS — E039 Hypothyroidism, unspecified: Secondary | ICD-10-CM

## 2020-09-27 DIAGNOSIS — E782 Mixed hyperlipidemia: Secondary | ICD-10-CM

## 2020-09-27 DIAGNOSIS — R053 Chronic cough: Secondary | ICD-10-CM | POA: Diagnosis not present

## 2020-09-27 DIAGNOSIS — I700 Atherosclerosis of aorta: Secondary | ICD-10-CM

## 2020-09-27 DIAGNOSIS — Z79890 Hormone replacement therapy (postmenopausal): Secondary | ICD-10-CM

## 2020-09-27 NOTE — Assessment & Plan Note (Signed)
Chronic Has been controlled Continue wellbutrin xl 450 mg daily and estradiol 0.5 mg daily

## 2020-09-27 NOTE — Assessment & Plan Note (Signed)
Chronic Continue estrace 0.5 mg daily - we agree benefits outweigh risks

## 2020-09-27 NOTE — Assessment & Plan Note (Signed)
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

## 2020-09-27 NOTE — Assessment & Plan Note (Signed)
Chronic Controlled, stable Continue wellbutrin xl 450 mg qd, xanax 0.5 mg TID prn

## 2020-09-27 NOTE — Assessment & Plan Note (Addendum)
Chronic Will check lipids at next visit Lifestyle controlled Regular exercise and healthy diet encouraged

## 2020-09-27 NOTE — Assessment & Plan Note (Signed)
Chronic  Clinically euthyroid Currently taking levothyroxine 75 mcg daily Check tsh  Titrate med dose if needed  

## 2020-09-27 NOTE — Assessment & Plan Note (Addendum)
Chronic Mild  Does not want to take medication Has improved diet Advised to keep active

## 2020-09-27 NOTE — Assessment & Plan Note (Signed)
Chronic Controlled, stable Continue Wellbutrin xl 450 mg daily Also taking natural supplements

## 2020-10-03 DIAGNOSIS — G894 Chronic pain syndrome: Secondary | ICD-10-CM | POA: Diagnosis not present

## 2020-10-03 DIAGNOSIS — M5416 Radiculopathy, lumbar region: Secondary | ICD-10-CM | POA: Diagnosis not present

## 2020-10-03 DIAGNOSIS — G589 Mononeuropathy, unspecified: Secondary | ICD-10-CM | POA: Diagnosis not present

## 2020-10-03 DIAGNOSIS — Z6824 Body mass index (BMI) 24.0-24.9, adult: Secondary | ICD-10-CM | POA: Diagnosis not present

## 2020-10-03 DIAGNOSIS — Z9181 History of falling: Secondary | ICD-10-CM | POA: Diagnosis not present

## 2020-10-03 DIAGNOSIS — M797 Fibromyalgia: Secondary | ICD-10-CM | POA: Diagnosis not present

## 2020-10-04 DIAGNOSIS — M5416 Radiculopathy, lumbar region: Secondary | ICD-10-CM | POA: Diagnosis not present

## 2020-10-09 ENCOUNTER — Other Ambulatory Visit: Payer: Self-pay | Admitting: Internal Medicine

## 2020-10-11 ENCOUNTER — Encounter: Payer: Self-pay | Admitting: Internal Medicine

## 2020-10-11 DIAGNOSIS — F419 Anxiety disorder, unspecified: Secondary | ICD-10-CM

## 2020-10-11 MED ORDER — ALPRAZOLAM 0.5 MG PO TABS: ORAL_TABLET | ORAL | 0 refills | Status: DC

## 2020-10-15 ENCOUNTER — Telehealth: Payer: Self-pay | Admitting: Internal Medicine

## 2020-10-15 NOTE — Telephone Encounter (Signed)
LVM for pt to rtn my call to schedule AWV with [facility]. Please schedule AWV with [facility] if pt calls the office.

## 2020-10-17 ENCOUNTER — Encounter: Payer: Self-pay | Admitting: Internal Medicine

## 2020-10-17 ENCOUNTER — Telehealth: Payer: Medicare Other

## 2020-10-17 MED ORDER — BENZONATATE 200 MG PO CAPS: ORAL_CAPSULE | ORAL | 0 refills | Status: DC

## 2020-10-18 ENCOUNTER — Encounter: Payer: Self-pay | Admitting: Internal Medicine

## 2020-10-18 ENCOUNTER — Ambulatory Visit (INDEPENDENT_AMBULATORY_CARE_PROVIDER_SITE_OTHER): Payer: Medicare Other | Admitting: Internal Medicine

## 2020-10-18 ENCOUNTER — Other Ambulatory Visit: Payer: Self-pay

## 2020-10-18 VITALS — BP 146/68 | HR 106 | Temp 98.6°F | Ht 59.75 in | Wt 119.0 lb

## 2020-10-18 DIAGNOSIS — T7840XA Allergy, unspecified, initial encounter: Secondary | ICD-10-CM | POA: Insufficient documentation

## 2020-10-18 MED ORDER — HYDROXYZINE PAMOATE 25 MG PO CAPS: 25.0000 mg | ORAL_CAPSULE | Freq: Three times a day (TID) | ORAL | 0 refills | Status: DC | PRN

## 2020-10-18 MED ORDER — METHYLPREDNISOLONE ACETATE 80 MG/ML IJ SUSP
80.0000 mg | Freq: Once | INTRAMUSCULAR | Status: AC
  Administered 2020-10-18: 80 mg via INTRAMUSCULAR

## 2020-10-18 NOTE — Patient Instructions (Addendum)
You received a steroid injection today    Try hydroxyzine 25 mg three times a day as needed for itching.     Have the allergist send me a note.

## 2020-10-18 NOTE — Assessment & Plan Note (Signed)
Acute Allergic reaction to oxycodone Persistent symptoms since then-she feels this has flared up all of her allergies Something like this has occurred in the past Depo-Medrol 80 mg IM x1 Trial of hydroxyzine 25 mg every 8 hours as needed for itching Stop Benadryl Has an appointment with her allergist next week

## 2020-10-18 NOTE — Progress Notes (Signed)
Subjective:    Patient ID: Laura Barnes, female    DOB: 23-Jun-1940, 80 y.o.   MRN: [id number]  HPI The patient is here for an acute visit.   Took oxycodone 5/1 and later it caused itching.  She has a lot of allergies and she thinks her reacting to this causing all her allergies to flare.  This is happened in the past and she has scheduled an appointment with an allergist for next week.  Since that time she has had itching all over.  Her face, neck, chest and back feel dry and have a burning sensation.  She feels like this is spreading.  She has been taking Benadryl and that is no longer helping.  She just wants to get some relief.  She does have some pink blotchiness in different areas of her body, but denies hives.  She has not had any difficulty swallowing, coughing, wheezing, chest tightness or shortness of breath that is new.  Medications and allergies reviewed with patient and updated if appropriate.  Patient Active Problem List   Diagnosis Date Noted  . Aortic atherosclerosis ([place]) 03/29/2020  . 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 Scoliosis of thoracolumbar spine 07/11/2019  . Choking 03/24/2019  . Hypokalemia 03/24/2019  . Fatigue 12/31/2018  . Epistaxis, recurrent 08/08/2018  . Degenerative arthritis of knee, bilateral 07/05/2018  . Protrusion of lumbar intervertebral disc 04/20/2018  . Difficulty urinating 11/24/2017  . Dizziness 08/11/2017  . Sacroiliac pain 06/17/2017  . Greater trochanteric bursitis of right hip 05/27/2017  . Vertigo 04/29/2017  . Dysphagia 10/28/2016  . Lump of skin 05/11/2016  . 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 Chronic meniscal tear of knee 03/05/2016  . Nausea 03/04/2016  . Osteopenia 12/03/2015  . Thyroid nodule 11/16/2015  . Bilateral leg edema 11/05/2015  . Anterior neck pain 11/05/2015  . Hormone replacement therapy (HRT) 11/05/2015  . Subacromial bursitis 09/16/2015  . Spondylosis of lumbar region  without myelopathy or radiculopathy 03/12/2015  . Trochanteric bursitis of both hips 03/12/2015  . Subacromial impingement of left shoulder 03/12/2015  . Hyperlipidemia 05/01/2014  . Chronic pain syndrome 05/01/2014  . Lumbar radiculopathy 03/27/2014  . Left shoulder pain 11/07/2013  . Degeneration of intervertebral disc of cervical region 10/10/2013  . Fibromyalgia 09/06/2013  . Bilateral shoulder pain 09/06/2013  . Chronic cough 04/12/2013  . Sinusitis, chronic 04/12/2013  . Hypothyroidism 04/07/2012  . Chronic venous insufficiency 04/02/2010  . Enthesopathy of hip region 07/19/2007  . Osteoporosis 07/19/2007  . Anxiety 01/23/2007  . Depression 01/23/2007  . Migraine 01/23/2007  . NINAR (noninfectious nonallergic rhinitis) 01/23/2007  . Gastroesophageal reflux disease 01/23/2007  . IBS 01/23/2007    Current Outpatient Medications on File Prior to Visit  Medication Sig Dispense Refill  . acetaminophen (TYLENOL) 500 MG tablet Take 1,000 mg by mouth every 6 (six) hours as needed for mild pain, moderate pain, fever or headache.    . ALPRAZolam (XANAX) 0.5 MG tablet TAKE 1 TABLET(0.5 MG) BY MOUTH THREE TIMES DAILY AS NEEDED FOR ANXIETY 90 tablet 0  . Amino Acids (AMINO ACID PO) Take 1 capsule by mouth 2 (two) times daily.    . Ascorbic Acid (VITAMIN C) 1000 MG tablet Take 1,000 mg by mouth 2 (two) times daily.    . B Complex-Biotin-FA (B COMPLETE) TABS Take 1 tablet by mouth daily. B Complete 100mg     . buPROPion (WELLBUTRIN XL) 150 MG 24 hr tablet TAKE 1 TABLET(150 MG) BY  MOUTH EVERY MORNING.  FOR TOTAL OF 450 MG (Patient taking differently: TAKE 1 TABLET(150 MG) BY MOUTH EVERY MORNING AS NEEDED  FOR TOTAL OF 450 MG) 90 tablet 1  . buPROPion (WELLBUTRIN XL) 300 MG 24 hr tablet TAKE 1 TABLET(300 MG) BY MOUTH DAILY 90 tablet 1  . Cholecalciferol (VITAMIN D3) 1000 units CAPS Take 1,000 Units by mouth daily.    . Coenzyme Q10 (CO Q-10) 200 MG CAPS Take 1 capsule by mouth 2 (two) times  daily.    Marland Kitchen ECHINACEA PO Take 1 tablet by mouth 2 (two) times daily.    Marland Kitchen ELDERBERRY PO Take by mouth daily.    Marland Kitchen estradiol (ESTRACE) 0.5 MG tablet TAKE 1 TABLET(0.5 MG) BY MOUTH DAILY 90 tablet 1  . fluticasone (FLONASE) 50 MCG/ACT nasal spray Place 2 sprays into both nostrils as needed.     Nyoka Cowden Tea, Camellia sinensis, POWD Take 1 capsule by mouth daily.    . Homeopathic Products (ARNICA [place]) PLLT Take by mouth daily.    Marland Kitchen KRILL OIL PO Take by mouth daily.    Marland Kitchen LECITHIN PO Take 1 tablet by mouth daily.    Marland Kitchen levothyroxine (SYNTHROID) 75 MCG tablet TAKE 1 TABLET BY MOUTH EVERY DAY IN THE MORNING 90 tablet 1  . MAGNESIUM ASPARTATE PO Take 1 tablet by mouth 2 (two) times daily.    . meclizine (ANTIVERT) 25 MG tablet Take 1 tablet (25 mg total) by mouth 3 (three) times daily as needed for dizziness. 60 tablet 5  . Misc. Devices (ROLLATOR) MISC Use rollator for ambulation 1 each 0  . Multiple Vitamins-Minerals (ZINC PO) Take by mouth daily.    . naloxone (NARCAN) nasal spray 4 mg/0.1 mL SMARTSIG:Both Nares    . ondansetron (ZOFRAN) 4 MG tablet TAKE 1 TABLET(4 MG) BY MOUTH EVERY 8 HOURS AS NEEDED FOR NAUSEA OR VOMITING 30 tablet 0  . ondansetron (ZOFRAN-ODT) 8 MG disintegrating tablet DISSOLVE 1 TABLET(8 MG) ON THE TONGUE EVERY 8 HOURS AS NEEDED FOR NAUSEA OR VOMITING 30 tablet 2  . Oxycodone HCl 10 MG TABS Take 10 mg by mouth 4 (four) times daily as needed.    Marland Kitchen POTASSIUM AMINOBENZOATE PO Take 1 tablet by mouth daily.    Marland Kitchen triamterene-hydrochlorothiazide (MAXZIDE-25) 37.5-25 MG tablet TAKE 1 TABLET BY MOUTH DAILY 90 tablet 1  . TURMERIC PO Take 1 tablet by mouth 2 (two) times daily.     . vitamin E 400 UNIT capsule Take 400 Units by mouth daily.    . benzonatate (TESSALON) 100 MG capsule Take by mouth. (Patient not taking: Reported on 10/18/2020)    . benzonatate (TESSALON) 200 MG capsule TAKE 1 CAPSULE(200 MG) BY MOUTH THREE TIMES DAILY AS NEEDED (Patient not taking: Reported on 10/18/2020)  90 capsule 0   No current facility-administered medications on file prior to visit.    Past Medical History:  Diagnosis Date  . ALLERGIC RHINITIS   . Allergy   . Anemia   . ANXIETY   . Arthritis    hands  . Bronchitis   . Chronic fatigue fibromyalgia syndrome   . Complication of anesthesia    says one time waking up she couldn't breathe, felt like her throat closing up  . DEPRESSION   . Enthesopathy of hip region   . Family history of anesthesia complication    sister with n/v  . FOOT PAIN, BILATERAL   . GERD   . Hiatal hernia   . HYPERLIPIDEMIA   .  Hypothyroidism   . IBS   . MIGRAINE HEADACHE   . MIGRAINE, COMMON   . NECK MASS   . OSTEOPENIA   . OSTEOPOROSIS   . PLANTAR FASCIITIS   . RASH-NONVESICULAR   . SINUSITIS- ACUTE-NOS   . SKIN LESION   . VAGINITIS   . VENOUS INSUFFICIENCY, CHRONIC     Past Surgical History:  Procedure Laterality Date  . APPENDECTOMY    . BREAST ENHANCEMENT SURGERY    . BREAST IMPLANT REMOVAL    . CHOLECYSTECTOMY    . COLONOSCOPY    . FOOT SURGERY  11/06/2016  . MASTECTOMY     bilateral for severe bilateral fibrocystic disease  . OOPHORECTOMY    . s/p neck lump removal    . SHOULDER SURGERY    . SINUS ENDO W/FUSION Right 06/30/2013   Procedure: RIGHT ENDOSCOPIC SPHENOIDECTOMY WITH FUSION SCAN;  Surgeon: [name], MD;  Location: [facility];  Service: ENT;  Laterality: Right;  . TONSILLECTOMY    . VAGINAL HYSTERECTOMY      Social History   Socioeconomic History  . Marital status: Widowed    Spouse name: Not on file  . Number of children: 2  . Years of education: Not on file  . Highest education level: Not on file  Occupational History  . Occupation: retired Press photographer  Tobacco Use  . Smoking status: Never Smoker  . Smokeless tobacco: Never Used  Vaping Use  . Vaping Use: Never used  Substance and Sexual Activity  . Alcohol use: No  . Drug use: No  . Sexual activity: Not Currently  Other Topics Concern  . Not on  file  Social History Narrative   Has 2 biological children and 1 step child   Social Determinants of Health   Financial Resource Strain: Not on file  Food Insecurity: Not on file  Transportation Needs: Not on file  Physical Activity: Not on file  Stress: Not on file  Social Connections: Not on file    Family History  Problem Relation Age of Onset  . Diabetes Mother   . Heart disease Father   . Diabetes Father   . Diabetes Sister   . Breast cancer Maternal Grandmother   . Heart disease Maternal Uncle        x 2  . Heart disease Maternal Aunt   . Kidney disease Paternal Uncle        questionable  . Heart disease Son   . Healthy Son   . Irritable bowel syndrome Son   . Colon cancer Neg Hx   . Colon polyps Neg Hx   . Rectal cancer Neg Hx   . Stomach cancer Neg Hx     Review of Systems  Constitutional: Negative for fever.  HENT:       No throat or mouth / lip swelling  Respiratory: Positive for shortness of breath (chronic - no change). Negative for cough, choking and wheezing.   Skin: Positive for color change (areas of pink blotchiness). Negative for rash.  Neurological: Positive for headaches.       Objective:   Vitals:   10/18/20 1542  BP: (!) 146/68  Pulse: (!) 106  Temp: 98.6 F (37 C)  SpO2: 95%   BP Readings from Last 3 Encounters:  10/18/20 (!) 146/68  09/27/20 138/82  08/28/20 (!) 151/94   Wt Readings from Last 3 Encounters:  10/18/20 119 lb (54 kg)  09/27/20 117 lb 12.8 oz (53.4 kg)  08/28/20 116 lb  6.4 oz (52.8 kg)   Body mass index is 23.44 kg/m.   Physical Exam Constitutional:      General: She is not in acute distress.    Appearance: Normal appearance. She is not ill-appearing, toxic-appearing or diaphoretic.  HENT:     Head: Normocephalic and atraumatic.  Cardiovascular:     Rate and Rhythm: Normal rate and regular rhythm.  Pulmonary:     Effort: Pulmonary effort is normal. No respiratory distress.     Breath sounds: No wheezing  or rales.  Skin:    General: Skin is warm and dry.     Findings: Bruising (Few areas of bruising-she  denies trauma ) present. No rash (Recent slight pink discoloration, but no rash or hives).  Neurological:     Mental Status: She is alert.            Assessment & Plan:    See Problem List for Assessment and Plan of chronic medical problems.    This visit occurred during the SARS-CoV-2 public health emergency.  Safety protocols were in place, including screening questions prior to the visit, additional usage of staff PPE, and extensive cleaning of exam room while observing appropriate contact time as indicated for disinfecting solutions.

## 2020-10-20 ENCOUNTER — Encounter: Payer: Self-pay | Admitting: Internal Medicine

## 2020-10-22 ENCOUNTER — Ambulatory Visit (INDEPENDENT_AMBULATORY_CARE_PROVIDER_SITE_OTHER): Payer: Medicare Other | Admitting: Internal Medicine

## 2020-10-22 ENCOUNTER — Other Ambulatory Visit: Payer: Self-pay

## 2020-10-22 ENCOUNTER — Encounter: Payer: Self-pay | Admitting: Internal Medicine

## 2020-10-22 DIAGNOSIS — L0390 Cellulitis, unspecified: Secondary | ICD-10-CM | POA: Insufficient documentation

## 2020-10-22 DIAGNOSIS — S41111A Laceration without foreign body of right upper arm, initial encounter: Secondary | ICD-10-CM | POA: Diagnosis not present

## 2020-10-22 DIAGNOSIS — L03113 Cellulitis of right upper limb: Secondary | ICD-10-CM | POA: Diagnosis not present

## 2020-10-22 MED ORDER — CEPHALEXIN 500 MG PO CAPS: 500.0000 mg | ORAL_CAPSULE | Freq: Three times a day (TID) | ORAL | 0 refills | Status: AC

## 2020-10-22 NOTE — Patient Instructions (Addendum)
    Your skin tear on the shoulder looks infected.  Start the antibiotic and monitor all your wounds.     Call with any concerns.

## 2020-10-22 NOTE — Progress Notes (Signed)
Subjective:    Patient ID: Laura Barnes, female    DOB: 12/30/1940, 80 y.o.   MRN: [id number]  HPI The patient is here for an acute visit.   Left wrist pain, multiple small scrapes and bruises-she fell 3 days ago.  6:00 in the morning and she was letting her dog out and was holding open the door and the dog saw something and jerked her.  She hit her right forehead and has significant bruising around her right orbit.  She denies any headaches, lightheadedness, dizziness, changes in vision.  She did not have any loss of consciousness.  She has several scrapes, bruises and skin tears, most of which are minor.  Some of them do hurt, but she denies any concerns about broken bones.  She has a small laceration on her posterior left wrist-she states it looks like it wants to lose a little bit.  Part of it scabbed over.  There is some slight redness and tenderness around it.  She has a skin tear on her right upper/posterior shoulder and she has been putting a Band-Aid on it so we will get on her close because it has been oozing some pus.  She has a couple of other minor scrapes and bruises.     Medications and allergies reviewed with patient and updated if appropriate.  Patient Active Problem List   Diagnosis Date Noted  . Allergic reaction 10/18/2020  . Aortic atherosclerosis ([place]) 03/29/2020  . 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 Scoliosis of thoracolumbar spine 07/11/2019  . Choking 03/24/2019  . Hypokalemia 03/24/2019  . Fatigue 12/31/2018  . Epistaxis, recurrent 08/08/2018  . Degenerative arthritis of knee, bilateral 07/05/2018  . Protrusion of lumbar intervertebral disc 04/20/2018  . Difficulty urinating 11/24/2017  . Dizziness 08/11/2017  . Sacroiliac pain 06/17/2017  . Greater trochanteric bursitis of right hip 05/27/2017  . Vertigo 04/29/2017  . Dysphagia 10/28/2016  . Lump of skin 05/11/2016  . 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 Chronic  meniscal tear of knee 03/05/2016  . Nausea 03/04/2016  . Osteopenia 12/03/2015  . Thyroid nodule 11/16/2015  . Bilateral leg edema 11/05/2015  . Anterior neck pain 11/05/2015  . Hormone replacement therapy (HRT) 11/05/2015  . Subacromial bursitis 09/16/2015  . 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 Trochanteric bursitis of both hips 03/12/2015  . Subacromial impingement of left shoulder 03/12/2015  . Hyperlipidemia 05/01/2014  . Chronic pain syndrome 05/01/2014  . Lumbar radiculopathy 03/27/2014  . Left shoulder pain 11/07/2013  . Degeneration of intervertebral disc of cervical region 10/10/2013  . Fibromyalgia 09/06/2013  . Bilateral shoulder pain 09/06/2013  . Chronic cough 04/12/2013  . Sinusitis, chronic 04/12/2013  . Hypothyroidism 04/07/2012  . Chronic venous insufficiency 04/02/2010  . Enthesopathy of hip region 07/19/2007  . Osteoporosis 07/19/2007  . Anxiety 01/23/2007  . Depression 01/23/2007  . Migraine 01/23/2007  . NINAR (noninfectious nonallergic rhinitis) 01/23/2007  . Gastroesophageal reflux disease 01/23/2007  . IBS 01/23/2007    Current Outpatient Medications on File Prior to Visit  Medication Sig Dispense Refill  . acetaminophen (TYLENOL) 500 MG tablet Take 1,000 mg by mouth every 6 (six) hours as needed for mild pain, moderate pain, fever or headache.    . ALPRAZolam (XANAX) 0.5 MG tablet TAKE 1 TABLET(0.5 MG) BY MOUTH THREE TIMES DAILY AS NEEDED FOR ANXIETY 90 tablet 0  . Amino Acids (AMINO ACID PO) Take 1 capsule by mouth 2 (two)  times daily.    . Ascorbic Acid (VITAMIN C) 1000 MG tablet Take 1,000 mg by mouth 2 (two) times daily.    . B Complex-Biotin-FA (B COMPLETE) TABS Take 1 tablet by mouth daily. B Complete 100mg     . benzonatate (TESSALON) 100 MG capsule Take by mouth.    . benzonatate (TESSALON) 200 MG capsule TAKE 1 CAPSULE(200 MG) BY MOUTH THREE TIMES DAILY AS NEEDED 90 capsule 0  . buPROPion (WELLBUTRIN  XL) 150 MG 24 hr tablet TAKE 1 TABLET(150 MG) BY MOUTH EVERY MORNING.  FOR TOTAL OF 450 MG (Patient taking differently: TAKE 1 TABLET(150 MG) BY MOUTH EVERY MORNING AS NEEDED  FOR TOTAL OF 450 MG) 90 tablet 1  . buPROPion (WELLBUTRIN XL) 300 MG 24 hr tablet TAKE 1 TABLET(300 MG) BY MOUTH DAILY 90 tablet 1  . Cholecalciferol (VITAMIN D3) 1000 units CAPS Take 1,000 Units by mouth daily.    . Coenzyme Q10 (CO Q-10) 200 MG CAPS Take 1 capsule by mouth 2 (two) times daily.    Marland Kitchen ECHINACEA PO Take 1 tablet by mouth 2 (two) times daily.    Marland Kitchen ELDERBERRY PO Take by mouth daily.    Marland Kitchen estradiol (ESTRACE) 0.5 MG tablet TAKE 1 TABLET(0.5 MG) BY MOUTH DAILY 90 tablet 1  . fluticasone (FLONASE) 50 MCG/ACT nasal spray Place 2 sprays into both nostrils as needed.     Nyoka Cowden Tea, Camellia sinensis, POWD Take 1 capsule by mouth daily.    . Homeopathic Products (ARNICA [place]) PLLT Take by mouth daily.    . hydrOXYzine (VISTARIL) 25 MG capsule Take 1 capsule (25 mg total) by mouth every 8 (eight) hours as needed for itching. 30 capsule 0  . KRILL OIL PO Take by mouth daily.    Marland Kitchen LECITHIN PO Take 1 tablet by mouth daily.    Marland Kitchen levothyroxine (SYNTHROID) 75 MCG tablet TAKE 1 TABLET BY MOUTH EVERY DAY IN THE MORNING 90 tablet 1  . MAGNESIUM ASPARTATE PO Take 1 tablet by mouth 2 (two) times daily.    . meclizine (ANTIVERT) 25 MG tablet Take 1 tablet (25 mg total) by mouth 3 (three) times daily as needed for dizziness. 60 tablet 5  . Misc. Devices (ROLLATOR) MISC Use rollator for ambulation 1 each 0  . Multiple Vitamins-Minerals (ZINC PO) Take by mouth daily.    . naloxone (NARCAN) nasal spray 4 mg/0.1 mL SMARTSIG:Both Nares    . ondansetron (ZOFRAN) 4 MG tablet TAKE 1 TABLET(4 MG) BY MOUTH EVERY 8 HOURS AS NEEDED FOR NAUSEA OR VOMITING 30 tablet 0  . ondansetron (ZOFRAN-ODT) 8 MG disintegrating tablet DISSOLVE 1 TABLET(8 MG) ON THE TONGUE EVERY 8 HOURS AS NEEDED FOR NAUSEA OR VOMITING 30 tablet 2  . POTASSIUM  AMINOBENZOATE PO Take 1 tablet by mouth daily.    Marland Kitchen triamterene-hydrochlorothiazide (MAXZIDE-25) 37.5-25 MG tablet TAKE 1 TABLET BY MOUTH DAILY 90 tablet 1  . TURMERIC PO Take 1 tablet by mouth 2 (two) times daily.     . vitamin E 400 UNIT capsule Take 400 Units by mouth daily.    . Oxycodone HCl 10 MG TABS Take 10 mg by mouth 4 (four) times daily as needed.     No current facility-administered medications on file prior to visit.    Past Medical History:  Diagnosis Date  . ALLERGIC RHINITIS   . Allergy   . Anemia   . ANXIETY   . Arthritis    hands  . Bronchitis   . Chronic  fatigue fibromyalgia syndrome   . Complication of anesthesia    says one time waking up she couldn't breathe, felt like her throat closing up  . DEPRESSION   . Enthesopathy of hip region   . Family history of anesthesia complication    sister with n/v  . FOOT PAIN, BILATERAL   . GERD   . Hiatal hernia   . HYPERLIPIDEMIA   . Hypothyroidism   . IBS   . MIGRAINE HEADACHE   . MIGRAINE, COMMON   . NECK MASS   . OSTEOPENIA   . OSTEOPOROSIS   . PLANTAR FASCIITIS   . RASH-NONVESICULAR   . SINUSITIS- ACUTE-NOS   . SKIN LESION   . VAGINITIS   . VENOUS INSUFFICIENCY, CHRONIC     Past Surgical History:  Procedure Laterality Date  . APPENDECTOMY    . BREAST ENHANCEMENT SURGERY    . BREAST IMPLANT REMOVAL    . CHOLECYSTECTOMY    . COLONOSCOPY    . FOOT SURGERY  11/06/2016  . MASTECTOMY     bilateral for severe bilateral fibrocystic disease  . OOPHORECTOMY    . s/p neck lump removal    . SHOULDER SURGERY    . SINUS ENDO W/FUSION Right 06/30/2013   Procedure: RIGHT ENDOSCOPIC SPHENOIDECTOMY WITH FUSION SCAN;  Surgeon: [name], MD;  Location: [facility];  Service: ENT;  Laterality: Right;  . TONSILLECTOMY    . VAGINAL HYSTERECTOMY      Social History   Socioeconomic History  . Marital status: Widowed    Spouse name: Not on file  . Number of children: 2  . Years of education: Not on file  .  Highest education level: Not on file  Occupational History  . Occupation: retired Press photographer  Tobacco Use  . Smoking status: Never Smoker  . Smokeless tobacco: Never Used  Vaping Use  . Vaping Use: Never used  Substance and Sexual Activity  . Alcohol use: No  . Drug use: No  . Sexual activity: Not Currently  Other Topics Concern  . Not on file  Social History Narrative   Has 2 biological children and 1 step child   Social Determinants of Health   Financial Resource Strain: Not on file  Food Insecurity: Not on file  Transportation Needs: Not on file  Physical Activity: Not on file  Stress: Not on file  Social Connections: Not on file    Family History  Problem Relation Age of Onset  . Diabetes Mother   . Heart disease Father   . Diabetes Father   . Diabetes Sister   . Breast cancer Maternal Grandmother   . Heart disease Maternal Uncle        x 2  . Heart disease Maternal Aunt   . Kidney disease Paternal Uncle        questionable  . Heart disease Son   . Healthy Son   . Irritable bowel syndrome Son   . Colon cancer Neg Hx   . Colon polyps Neg Hx   . Rectal cancer Neg Hx   . Stomach cancer Neg Hx     Review of Systems  Constitutional: Negative for fever.  Eyes: Negative for visual disturbance.  Neurological: Negative for dizziness, light-headedness and headaches.  Hematological: Bruises/bleeds easily.       Objective:   Vitals:   10/22/20 1549  BP: (!) 146/82  Pulse: (!) 101  Temp: 98.5 F (36.9 C)  SpO2: 98%   BP Readings from Last 3  Encounters:  10/22/20 (!) 146/82  10/18/20 (!) 146/68  09/27/20 138/82   Wt Readings from Last 3 Encounters:  10/22/20 119 lb (54 kg)  10/18/20 119 lb (54 kg)  09/27/20 117 lb 12.8 oz (53.4 kg)   Body mass index is 23.44 kg/m.   Physical Exam Constitutional:      General: She is not in acute distress.    Appearance: Normal appearance. She is not ill-appearing.  HENT:     Head: Normocephalic.     Comments:  Significant ecchymosis around right orbit, small hematoma just above lateral right eyebrow that is slightly tender-size of a pea.  No eyelid swelling.  No conjunctival bleeding Musculoskeletal:     Right lower leg: No edema.     Left lower leg: No edema.  Skin:    General: Skin is warm and dry.     Comments: Skin tear right posterior shoulder-there is some pus present, minimal surrounding erythema.  Small cut on left posterior wrist-partially scabbed, slight clear oozing fluid, mild surrounding erythema.  Several minor scrapes and bruises bilateral arms without any concern for infection  Neurological:     Mental Status: She is alert.            Assessment & Plan:    Tetanus vaccine up-to-date   See Problem List for Assessment and Plan of chronic medical problems.    This visit occurred during the SARS-CoV-2 public health emergency.  Safety protocols were in place, including screening questions prior to the visit, additional usage of staff PPE, and extensive cleaning of exam room while observing appropriate contact time as indicated for disinfecting solutions.

## 2020-10-22 NOTE — Assessment & Plan Note (Signed)
Acute Related to fall 3 days ago There does appear to be some pus and concern for infection Will start antibiotic-Keflex 500 mg 3 times daily x7 days She will keep the area covered and monitor it closely Other skin tears and small cuts are healing appropriately

## 2020-10-22 NOTE — Assessment & Plan Note (Signed)
Acute Related to fall 3 days ago Skin tear of right posterior shoulder that does have pus and mild erythema surrounding it-concern for infection/cellulitis Start Keflex 500 mg 3 times daily x7 days She will monitor the area closely

## 2020-10-24 ENCOUNTER — Encounter: Payer: Self-pay | Admitting: Internal Medicine

## 2020-10-24 ENCOUNTER — Ambulatory Visit: Payer: Medicare Other

## 2020-10-24 MED ORDER — FLUCONAZOLE 150 MG PO TABS: 150.0000 mg | ORAL_TABLET | Freq: Once | ORAL | 0 refills | Status: AC

## 2020-10-25 ENCOUNTER — Ambulatory Visit: Payer: Medicare Other

## 2020-10-28 ENCOUNTER — Encounter: Payer: Self-pay | Admitting: Internal Medicine

## 2020-10-28 DIAGNOSIS — M797 Fibromyalgia: Secondary | ICD-10-CM | POA: Diagnosis not present

## 2020-10-28 DIAGNOSIS — T7840XA Allergy, unspecified, initial encounter: Secondary | ICD-10-CM

## 2020-10-28 DIAGNOSIS — J309 Allergic rhinitis, unspecified: Secondary | ICD-10-CM

## 2020-10-28 DIAGNOSIS — Z79899 Other long term (current) drug therapy: Secondary | ICD-10-CM | POA: Diagnosis not present

## 2020-10-28 DIAGNOSIS — M5416 Radiculopathy, lumbar region: Secondary | ICD-10-CM | POA: Diagnosis not present

## 2020-11-20 DIAGNOSIS — M5416 Radiculopathy, lumbar region: Secondary | ICD-10-CM | POA: Diagnosis not present

## 2020-11-21 ENCOUNTER — Other Ambulatory Visit: Payer: Self-pay | Admitting: Internal Medicine

## 2020-11-21 DIAGNOSIS — F419 Anxiety disorder, unspecified: Secondary | ICD-10-CM

## 2020-11-22 ENCOUNTER — Other Ambulatory Visit: Payer: Self-pay | Admitting: Internal Medicine

## 2020-11-22 DIAGNOSIS — F419 Anxiety disorder, unspecified: Secondary | ICD-10-CM

## 2020-11-22 MED ORDER — ALPRAZOLAM 0.5 MG PO TABS: ORAL_TABLET | ORAL | 0 refills | Status: DC

## 2020-11-26 DIAGNOSIS — M9903 Segmental and somatic dysfunction of lumbar region: Secondary | ICD-10-CM | POA: Diagnosis not present

## 2020-11-26 DIAGNOSIS — M9905 Segmental and somatic dysfunction of pelvic region: Secondary | ICD-10-CM | POA: Diagnosis not present

## 2020-11-26 DIAGNOSIS — M5416 Radiculopathy, lumbar region: Secondary | ICD-10-CM | POA: Diagnosis not present

## 2020-11-26 DIAGNOSIS — M9904 Segmental and somatic dysfunction of sacral region: Secondary | ICD-10-CM | POA: Diagnosis not present

## 2020-11-26 DIAGNOSIS — M5136 Other intervertebral disc degeneration, lumbar region: Secondary | ICD-10-CM | POA: Diagnosis not present

## 2020-11-27 DIAGNOSIS — G894 Chronic pain syndrome: Secondary | ICD-10-CM | POA: Diagnosis not present

## 2020-11-27 DIAGNOSIS — Z1159 Encounter for screening for other viral diseases: Secondary | ICD-10-CM | POA: Diagnosis not present

## 2020-11-27 DIAGNOSIS — M5416 Radiculopathy, lumbar region: Secondary | ICD-10-CM | POA: Diagnosis not present

## 2020-11-27 DIAGNOSIS — Z9181 History of falling: Secondary | ICD-10-CM | POA: Diagnosis not present

## 2020-11-27 DIAGNOSIS — Z79899 Other long term (current) drug therapy: Secondary | ICD-10-CM | POA: Diagnosis not present

## 2020-11-27 DIAGNOSIS — M797 Fibromyalgia: Secondary | ICD-10-CM | POA: Diagnosis not present

## 2020-11-27 DIAGNOSIS — G589 Mononeuropathy, unspecified: Secondary | ICD-10-CM | POA: Diagnosis not present

## 2020-12-04 ENCOUNTER — Other Ambulatory Visit: Payer: Self-pay | Admitting: Internal Medicine

## 2020-12-17 DIAGNOSIS — H31092 Other chorioretinal scars, left eye: Secondary | ICD-10-CM | POA: Diagnosis not present

## 2020-12-17 DIAGNOSIS — H353122 Nonexudative age-related macular degeneration, left eye, intermediate dry stage: Secondary | ICD-10-CM | POA: Diagnosis not present

## 2020-12-17 DIAGNOSIS — H43391 Other vitreous opacities, right eye: Secondary | ICD-10-CM | POA: Diagnosis not present

## 2020-12-17 DIAGNOSIS — H353111 Nonexudative age-related macular degeneration, right eye, early dry stage: Secondary | ICD-10-CM | POA: Diagnosis not present

## 2020-12-17 DIAGNOSIS — H35363 Drusen (degenerative) of macula, bilateral: Secondary | ICD-10-CM | POA: Diagnosis not present

## 2020-12-19 DIAGNOSIS — M5416 Radiculopathy, lumbar region: Secondary | ICD-10-CM | POA: Diagnosis not present

## 2020-12-24 ENCOUNTER — Other Ambulatory Visit: Payer: Self-pay

## 2020-12-24 ENCOUNTER — Encounter: Payer: Self-pay | Admitting: Allergy

## 2020-12-24 ENCOUNTER — Ambulatory Visit: Payer: Medicare Other | Admitting: Allergy

## 2020-12-24 VITALS — BP 140/74 | HR 99 | Temp 98.1°F | Resp 16 | Ht 59.0 in | Wt 128.8 lb

## 2020-12-24 DIAGNOSIS — R21 Rash and other nonspecific skin eruption: Secondary | ICD-10-CM | POA: Insufficient documentation

## 2020-12-24 DIAGNOSIS — J3089 Other allergic rhinitis: Secondary | ICD-10-CM | POA: Diagnosis not present

## 2020-12-24 DIAGNOSIS — T7840XD Allergy, unspecified, subsequent encounter: Secondary | ICD-10-CM

## 2020-12-24 DIAGNOSIS — T50995D Adverse effect of other drugs, medicaments and biological substances, subsequent encounter: Secondary | ICD-10-CM | POA: Insufficient documentation

## 2020-12-24 NOTE — Patient Instructions (Addendum)
Drug allergies Continue to avoid drugs that are bothersome to you.  Rhinitis: Use over the counter antihistamines such as Zyrtec (cetirizine), Claritin (loratadine), Allegra (fexofenadine), or Xyzal (levocetirizine) daily as needed.  Nasal saline spray (i.e., Simply Saline) is recommended as needed. Get bloodwork:  We are ordering labs, so please allow 1-2 weeks for the results to come back. With the newly implemented Cures Act, the labs might be visible to you at the same time that they become visible to me. However, I will not address the results until all of the results are back, so please be patient.   Rash: If you start breaking out in rashes again let us know. Take pictures and write down what you had come across/eaten that day.  Follow up as needed.

## 2020-12-24 NOTE — Progress Notes (Signed)
New Patient Note  RE: Laura Barnes MRN: [id number] DOB: 07/17/1940 Date of Office Visit: 12/24/2020  Consult requested by: Binnie Rail, MD Primary care provider: Binnie Rail, MD  Chief Complaint: Allergic Reaction and Allergic Rhinitis  (Sneezing, runny nose, itchy/watery eyes, chronic coughing.)  History of Present Illness: I had the pleasure of seeing Laura Barnes for initial evaluation at the [facility] on 12/24/2020. She is a 80 y.o. female, who is referred here by Binnie Rail, MD for the evaluation of drug allergies.  Drugs:  Patient has chronic pain issues since March 2021 and has been trying to take various pain medications by pain specialist but having reactions to them.  NSAIDs in the past caused GI bleeds requiring hospitalizations.  Voltaren gel caused nausea and vomiting in the past. Steroid injections caused trouble breathing 10 years ago but epidural with steroids caused no issues.  Lyrica - not sure what type of reaction she had to this.  Oxycodone, hydrocodone - caused nausea, vomiting, itching.  Patient only able to tolerate dilaudid now with no side effects.   Rash: Rash resolved and has no more rashes. She did take benadryl for this with no benefit.   Rhinitis: She reports symptoms of sneezing, rhinorrhea, itchy/watery eyes, chronic coughing. Symptoms have been going on for many years. The symptoms are present all year around. Other triggers include exposure to unknown. Anosmia: no. Headache: yes. She has used tessalon perles, benadryl with some improvement in symptoms. Sinus infections: no. Previous work up includes: 30+ years and used to give herself injections. Previous ENT evaluation: yes and had sinus surgery in the past.  History of nasal polyps: no. Last eye exam: this year. History of reflux: takes omeprazole as needed.  Patient saw cardiology in the past as well.  10/18/2020 PCP visit: "Took oxycodone 5/1 and  later it caused itching.  She has a lot of allergies and she thinks her reacting to this causing all her allergies to flare.  This is happened in the past and she has scheduled an appointment with an allergist for next week.   Since that time she has had itching all over.  Her face, neck, chest and back feel dry and have a burning sensation.  She feels like this is spreading.  She has been taking Benadryl and that is no longer helping.  She just wants to get some relief.   She does have some pink blotchiness in different areas of her body, but denies hives.  She has not had any difficulty swallowing, coughing, wheezing, chest tightness or shortness of breath that is new."  Assessment and Plan: Laura Barnes is a 80 y.o. female with: Rash and other nonspecific skin eruption Initially patient had a pruritis with erythema after started to take oxycodone. These symptoms resolved since she stopped taking oxycodone. History of itching with blue crabs but had no blue crabs with the above episode.  Continue to avoid drugs on the allergy list including oxycodone. If you start breaking out in rashes again let us know. Take pictures and write down what you had come across/eaten that day.  Adverse effect of other drugs, medicaments and biological substances, subsequent encounter Patient has a long list of adverse drug reactions and some are non-IgE mediated. She has been having issues with chronic pain and difficulty finding a treatment that does not cause side effects. Currently on dilaudid and follows with pain specialist.  Discussed with patient there is no bloodwork  or skin testing for most of these medications and given that she had significant side effects I recommend to continue to avoid drugs that gave her an issue.  There is no test that will predict any future drug allergies or side effects that she may develop which she wanted to know.  I will check a baseline tryptase level to rule out any mast cell  issues.   Other allergic rhinitis Perennial rhino conjunctivitis symptoms. Used to be on AIT 30+ years ago.  Will check environmental allergy panel via bloodwork as I don't think she can tolerate the skin prick testing on her back due to her chronic pain issues.  Get bloodwork and will make additional recommendations based on results. Use over the counter antihistamines such as Zyrtec (cetirizine), Claritin (loratadine), Allegra (fexofenadine), or Xyzal (levocetirizine) daily as needed.  Nasal saline spray (i.e., Simply Saline) is recommended as needed.  Return if symptoms worsen or fail to improve.  No orders of the defined types were placed in this encounter.  Lab Orders  Tryptase  Allergens w/Total IgE Area 2    Other allergy screening: Asthma: no Food allergy:  Blue crab causes itching Soy - unsure but testing was positive in the past.   Hymenoptera allergy: no History of recurrent infections suggestive of immunodeficency: no  Diagnostics: None.  Past Medical History: Patient Active Problem List   Diagnosis Date Noted   Rash and other nonspecific skin eruption 12/24/2020   Adverse effect of other drugs, medicaments and biological substances, subsequent encounter 12/24/2020   Other allergic rhinitis 12/24/2020   Skin tear of right upper extremity 10/22/2020   Cellulitis 10/22/2020   Allergic reaction 10/18/2020   Aortic atherosclerosis ([place]) 03/29/2020   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/11/2019   Scoliosis of thoracolumbar spine 07/11/2019   Choking 03/24/2019   Hypokalemia 03/24/2019   Fatigue 12/31/2018   Epistaxis, recurrent 08/08/2018   Degenerative arthritis of knee, bilateral 07/05/2018   Protrusion of lumbar intervertebral disc 04/20/2018   Difficulty urinating 11/24/2017   Dizziness 08/11/2017   Sacroiliac pain 06/17/2017   Greater trochanteric bursitis of right hip 05/27/2017   Vertigo 04/29/2017   Dysphagia 10/28/2016   Lump of  skin 05/11/2016   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   Chronic meniscal tear of knee 03/05/2016   Nausea 03/04/2016   Osteopenia 12/03/2015   Thyroid nodule 11/16/2015   Bilateral leg edema 11/05/2015   Anterior neck pain 11/05/2015   Hormone replacement therapy (HRT) 11/05/2015   Subacromial bursitis 09/16/2015   Spondylosis 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 myelopathy or radiculopathy 03/12/2015   Trochanteric bursitis of both hips 03/12/2015   Subacromial impingement of left shoulder 03/12/2015   Hyperlipidemia 05/01/2014   Chronic pain syndrome 05/01/2014   Lumbar radiculopathy 03/27/2014   Left shoulder pain 11/07/2013   Degeneration of intervertebral disc of cervical region 10/10/2013   Fibromyalgia 09/06/2013   Bilateral shoulder pain 09/06/2013   Chronic cough 04/12/2013   Sinusitis, chronic 04/12/2013   Hypothyroidism 04/07/2012   Chronic venous insufficiency 04/02/2010   Enthesopathy of hip region 07/19/2007   Osteoporosis 07/19/2007   Anxiety 01/23/2007   Depression 01/23/2007   Migraine 01/23/2007   NINAR (noninfectious nonallergic rhinitis) 01/23/2007   Gastroesophageal reflux disease 01/23/2007   IBS 01/23/2007   Past Medical History:  Diagnosis Date   ALLERGIC RHINITIS    Allergy    Anemia    ANXIETY    Arthritis    hands   Bronchitis    Chronic fatigue  fibromyalgia syndrome    Complication of anesthesia    says one time waking up she couldn't breathe, felt like her throat closing up   DEPRESSION    Enthesopathy of hip region    Family history of anesthesia complication    sister with n/v   FOOT PAIN, BILATERAL    GERD    Hiatal hernia    HYPERLIPIDEMIA    Hypothyroidism    IBS    MIGRAINE HEADACHE    MIGRAINE, COMMON    NECK MASS    OSTEOPENIA    OSTEOPOROSIS    PLANTAR FASCIITIS    RASH-NONVESICULAR    SINUSITIS- ACUTE-NOS    SKIN LESION    Urticaria    VAGINITIS    VENOUS INSUFFICIENCY, CHRONIC    Past Surgical History: Past Surgical History:   Procedure Laterality Date   APPENDECTOMY     BREAST ENHANCEMENT SURGERY     BREAST IMPLANT REMOVAL     CHOLECYSTECTOMY     COLONOSCOPY     FOOT SURGERY  11/06/2016   MASTECTOMY     bilateral for severe bilateral fibrocystic disease   OOPHORECTOMY     s/p neck lump removal     SHOULDER SURGERY     SINUS ENDO W/FUSION Right 06/30/2013   Procedure: RIGHT ENDOSCOPIC SPHENOIDECTOMY WITH FUSION SCAN;  Surgeon: [name], MD;  Location: [facility];  Service: ENT;  Laterality: Right;   TONSILLECTOMY     VAGINAL HYSTERECTOMY     Medication List:  Current Outpatient Medications  Medication Sig Dispense Refill   acetaminophen (TYLENOL) 500 MG tablet Take 1,000 mg by mouth every 6 (six) hours as needed for mild pain, moderate pain, fever or headache.     ALPRAZolam (XANAX) 0.5 MG tablet TAKE 1 TABLET(0.5 MG) BY MOUTH THREE TIMES DAILY AS NEEDED FOR ANXIETY 90 tablet 0   Amino Acids (AMINO ACID PO) Take 1 capsule by mouth 2 (two) times daily.     Ascorbic Acid (VITAMIN C) 1000 MG tablet Take 1,000 mg by mouth 2 (two) times daily.     B Complex-Biotin-FA (B COMPLETE) TABS Take 1 tablet by mouth daily. B Complete '100mg'$      benzonatate (TESSALON) 200 MG capsule TAKE 1 CAPSULE(200 MG) BY MOUTH THREE TIMES DAILY AS NEEDED 90 capsule 0   buPROPion (WELLBUTRIN XL) 300 MG 24 hr tablet TAKE 1 TABLET(300 MG) BY MOUTH DAILY 90 tablet 1   Cholecalciferol (VITAMIN D3) 1000 units CAPS Take 1,000 Units by mouth daily.     Coenzyme Q10 (CO Q-10) 200 MG CAPS Take 1 capsule by mouth 2 (two) times daily.     ECHINACEA PO Take 1 tablet by mouth 2 (two) times daily.     ELDERBERRY PO Take by mouth daily.     estradiol (ESTRACE) 0.5 MG tablet TAKE 1 TABLET(0.5 MG) BY MOUTH DAILY 90 tablet 1   fluticasone (FLONASE) 50 MCG/ACT nasal spray Place 2 sprays into both nostrils as needed.      Green Tea, Camellia sinensis, POWD Take 1 capsule by mouth daily.     Homeopathic Products (ARNICA [place]) PLLT Take by mouth  daily.     HYDROmorphone (DILAUDID) 2 MG tablet Take 2 mg by mouth 5 (five) times daily as needed.     hydrOXYzine (VISTARIL) 25 MG capsule Take 1 capsule (25 mg total) by mouth every 8 (eight) hours as needed for itching. 30 capsule 0   KRILL OIL PO Take by mouth daily.  LECITHIN PO Take 1 tablet by mouth daily.     levothyroxine (SYNTHROID) 75 MCG tablet TAKE 1 TABLET BY MOUTH EVERY MORNING 90 tablet 1   MAGNESIUM ASPARTATE PO Take 1 tablet by mouth 2 (two) times daily.     meclizine (ANTIVERT) 25 MG tablet Take 1 tablet (25 mg total) by mouth 3 (three) times daily as needed for dizziness. 60 tablet 5   Misc. Devices (ROLLATOR) MISC Use rollator for ambulation 1 each 0   Multiple Vitamins-Minerals (ZINC PO) Take by mouth daily.     naloxone (NARCAN) nasal spray 4 mg/0.1 mL SMARTSIG:Both Nares     ondansetron (ZOFRAN) 4 MG tablet TAKE 1 TABLET(4 MG) BY MOUTH EVERY 8 HOURS AS NEEDED FOR NAUSEA OR VOMITING 30 tablet 0   ondansetron (ZOFRAN-ODT) 8 MG disintegrating tablet DISSOLVE 1 TABLET(8 MG) ON THE TONGUE EVERY 8 HOURS AS NEEDED FOR NAUSEA OR VOMITING 30 tablet 2   POTASSIUM AMINOBENZOATE PO Take 1 tablet by mouth daily.     triamterene-hydrochlorothiazide (MAXZIDE-25) 37.5-25 MG tablet TAKE 1 TABLET BY MOUTH EVERY DAY 90 tablet 1   TURMERIC PO Take 1 tablet by mouth 2 (two) times daily.      vitamin E 400 UNIT capsule Take 400 Units by mouth daily.     No current facility-administered medications for this visit.   Allergies: Allergies  Allergen Reactions   Prednisone Anaphylaxis    Patient unable to remember why she needed to steroid shot but just knows that when she got back to work she started having a lot of trouble breathing.  (jkl 05/04/14)  Multiple epidural steroid injections with depomedrol with no issues. (05/04/18)   Amoxicillin-Pot Clavulanate Hives and Diarrhea    Has patient had a PCN reaction causing immediate rash, facial/tongue/throat swelling, SOB or lightheadedness  with hypotension: Unknown Has patient had a PCN reaction causing severe rash involving mucus membranes or skin necrosis: Unknown Has patient had a PCN reaction that required hospitalization: Unknown Has patient had a PCN reaction occurring within the last 10 years: Unknown If all of the above answers are "NO", then may proceed with Cephalosporin use.    Aspirin Other (See Comments)    Stomach ache and bleed   Erythromycin Diarrhea   Levofloxacin Other (See Comments)    Headaches, GI upset   Nsaids Other (See Comments)    stomach bleeding per pt   Statins Nausea Only and Other (See Comments)    Headache, upset stomach, joint hurt, heartburn   Sumatriptan Hives and Palpitations   Adhesive [Tape] Other (See Comments)    blisters   Ivp Dye [Iodinated Diagnostic Agents]     If made with blue shellfish then CAN NOT have this type of dye.  Pt was given contrast 10/12/17, not premedicated, had NO COMPLICATIONS- BLO, CT   Lyrica [Pregabalin]    Methylphenidate Hcl     Unknown reaction   Mirtazapine     Unknown reaction   Oxycodone     Itching    Red Yeast Rice [Cholestin] Other (See Comments)    Pt reports causes aching pains like statins do   Shellfish Allergy     Blue shellfish   Soy Allergy     Unknown reaction   Doxycycline Diarrhea   Hydrocodone Itching   Hydrocodone-Acetaminophen Itching   Latex Other (See Comments)    blisters   Rofecoxib Nausea Only   Sertraline Hcl Nausea Only   Sulfonamide Derivatives Itching   Social History: Social History  Socioeconomic History   Marital status: Widowed    Spouse name: Not on file   Number of children: 2   Years of education: Not on file   Highest education level: Not on file  Occupational History   Occupation: retired Press photographer  Tobacco Use   Smoking status: Never   Smokeless tobacco: Never  Vaping Use   Vaping Use: Never used  Substance and Sexual Activity   Alcohol use: No   Drug use: No   Sexual activity: Not  Currently  Other Topics Concern   Not on file  Social History Narrative   Has 2 biological children and 1 step child   Social Determinants of Health   Financial Resource Strain: Not on file  Food Insecurity: Not on file  Transportation Needs: Not on file  Physical Activity: Not on file  Stress: Not on file  Social Connections: Not on file   Lives in a 80 year old house. Smoking: denies Occupation: retired  Programme researcher, broadcasting/film/video HistoryFreight forwarder in the house: no Charity fundraiser in the family room: yes Carpet in the bedroom: no Heating: gas Cooling: central Pet: yes 2 dogs  x 10 yrs, 3 yrs  Family History: Family History  Problem Relation Age of Onset   Diabetes Mother    Heart disease Father    Diabetes Father    Diabetes Sister    Heart disease Maternal Aunt    Heart disease Maternal Uncle        x 2   Kidney disease Paternal Uncle        questionable   Asthma Maternal Grandmother    Breast cancer Maternal Grandmother    Heart disease Son    Healthy Son    Irritable bowel syndrome Son    Colon cancer Neg Hx    Colon polyps Neg Hx    Rectal cancer Neg Hx    Stomach cancer Neg Hx    Review of Systems  Constitutional:  Negative for appetite change, chills, fever and unexpected weight change.  HENT:  Negative for congestion and rhinorrhea.   Eyes:  Negative for itching.  Respiratory:  Negative for cough, chest tightness, shortness of breath and wheezing.   Cardiovascular:  Negative for chest pain.  Gastrointestinal:  Negative for abdominal pain.  Genitourinary:  Negative for difficulty urinating.  Musculoskeletal:  Positive for back pain.  Skin:  Negative for rash.  Neurological:  Negative for headaches.   Objective: BP 140/74 (BP Location: Right Arm, Patient Position: Sitting, Cuff Size: Normal)   Pulse 99   Temp 98.1 F (36.7 C) (Temporal)   Resp 16   Ht '4\' 11"'$  (1.499 m)   Wt 128 lb 12.8 oz (58.4 kg)   SpO2 97%   BMI 26.01 kg/m  Body mass index is  26.01 kg/m. Physical Exam Vitals and nursing note reviewed.  Constitutional:      Appearance: Normal appearance. She is well-developed.  HENT:     Head: Normocephalic and atraumatic.     Right Ear: External ear normal.     Left Ear: External ear normal.     Nose: Nose normal.     Mouth/Throat:     Mouth: Mucous membranes are moist.     Pharynx: Oropharynx is clear.  Eyes:     Conjunctiva/sclera: Conjunctivae normal.  Cardiovascular:     Rate and Rhythm: Normal rate and regular rhythm.     Heart sounds: Normal heart sounds. No murmur heard.   No friction rub. No gallop.  Pulmonary:     Effort: Pulmonary effort is normal.     Breath sounds: Normal breath sounds. No wheezing, rhonchi or rales.  Abdominal:     Palpations: Abdomen is soft.  Musculoskeletal:     Cervical back: Neck supple.  Skin:    General: Skin is warm.     Findings: No rash.  Neurological:     Mental Status: She is alert and oriented to person, place, and time.  Psychiatric:        Behavior: Behavior normal.  The plan was reviewed with the patient/family, and all questions/concerned were addressed.  It was my pleasure to see Laura Barnes today and participate in her care. Please feel free to contact me with any questions or concerns.  Sincerely,  Rexene Alberts, DO Allergy & Immunology  [facility] office: [place] office: [phone]

## 2020-12-24 NOTE — Assessment & Plan Note (Addendum)
Patient has a long list of adverse drug reactions and some are non-IgE mediated. She has been having issues with chronic pain and difficulty finding a treatment that does not cause side effects. Currently on dilaudid and follows with pain specialist.  . Discussed with patient there is no bloodwork or skin testing for most of these medications and given that she had significant side effects I recommend to continue to avoid drugs that gave her an issue.  . There is no test that will predict any future drug allergies or side effects that she may develop which she wanted to know.  . I will check a baseline tryptase level to rule out any mast cell issues.

## 2020-12-24 NOTE — Assessment & Plan Note (Addendum)
Perennial rhino conjunctivitis symptoms. Used to be on AIT 30+ years ago.   Will check environmental allergy panel via bloodwork as I don't think she can tolerate the skin prick testing on her back due to her chronic pain issues.   Get bloodwork and will make additional recommendations based on results. . Use over the counter antihistamines such as Zyrtec (cetirizine), Claritin (loratadine), Allegra (fexofenadine), or Xyzal (levocetirizine) daily as needed.  . Nasal saline spray (i.e., Simply Saline) is recommended as needed.

## 2020-12-24 NOTE — Assessment & Plan Note (Addendum)
Initially patient had a pruritis with erythema after started to take oxycodone. These symptoms resolved since she stopped taking oxycodone. History of itching with blue crabs but had no blue crabs with the above episode.  . Continue to avoid drugs on the allergy list including oxycodone. . If you start breaking out in rashes again let us know. . Take pictures and write down what you had come across/eaten that day.

## 2020-12-25 ENCOUNTER — Encounter: Payer: Self-pay | Admitting: Internal Medicine

## 2021-01-01 ENCOUNTER — Other Ambulatory Visit: Payer: Self-pay | Admitting: Internal Medicine

## 2021-01-01 DIAGNOSIS — F419 Anxiety disorder, unspecified: Secondary | ICD-10-CM

## 2021-01-02 MED ORDER — ALPRAZOLAM 0.5 MG PO TABS: ORAL_TABLET | ORAL | 0 refills | Status: DC

## 2021-01-11 ENCOUNTER — Encounter (HOSPITAL_COMMUNITY): Payer: Self-pay

## 2021-01-11 ENCOUNTER — Inpatient Hospital Stay (HOSPITAL_COMMUNITY): Payer: Medicare Other

## 2021-01-11 ENCOUNTER — Inpatient Hospital Stay (HOSPITAL_COMMUNITY)
Admission: EM | Admit: 2021-01-11 | Discharge: 2021-01-22 | DRG: 329 | Disposition: A | Discharge status: Home / Self Care | Payer: Medicare Other | Attending: Internal Medicine | Admitting: Internal Medicine

## 2021-01-11 ENCOUNTER — Other Ambulatory Visit: Payer: Self-pay

## 2021-01-11 ENCOUNTER — Emergency Department (HOSPITAL_COMMUNITY): Payer: Medicare Other

## 2021-01-11 DIAGNOSIS — I5033 Acute on chronic diastolic (congestive) heart failure: Secondary | ICD-10-CM | POA: Diagnosis not present

## 2021-01-11 DIAGNOSIS — E785 Hyperlipidemia, unspecified: Secondary | ICD-10-CM | POA: Diagnosis not present

## 2021-01-11 DIAGNOSIS — I872 Venous insufficiency (chronic) (peripheral): Secondary | ICD-10-CM | POA: Diagnosis not present

## 2021-01-11 DIAGNOSIS — Z0189 Encounter for other specified special examinations: Secondary | ICD-10-CM | POA: Diagnosis not present

## 2021-01-11 DIAGNOSIS — R1032 Left lower quadrant pain: Secondary | ICD-10-CM | POA: Diagnosis not present

## 2021-01-11 DIAGNOSIS — K219 Gastro-esophageal reflux disease without esophagitis: Secondary | ICD-10-CM | POA: Diagnosis present

## 2021-01-11 DIAGNOSIS — R9431 Abnormal electrocardiogram [ECG] [EKG]: Secondary | ICD-10-CM | POA: Diagnosis not present

## 2021-01-11 DIAGNOSIS — F32A Depression, unspecified: Secondary | ICD-10-CM | POA: Diagnosis present

## 2021-01-11 DIAGNOSIS — D649 Anemia, unspecified: Secondary | ICD-10-CM | POA: Diagnosis not present

## 2021-01-11 DIAGNOSIS — E871 Hypo-osmolality and hyponatremia: Secondary | ICD-10-CM | POA: Diagnosis not present

## 2021-01-11 DIAGNOSIS — K450 Other specified abdominal hernia with obstruction, without gangrene: Principal | ICD-10-CM | POA: Diagnosis present

## 2021-01-11 DIAGNOSIS — E876 Hypokalemia: Secondary | ICD-10-CM | POA: Diagnosis not present

## 2021-01-11 DIAGNOSIS — R1900 Intra-abdominal and pelvic swelling, mass and lump, unspecified site: Secondary | ICD-10-CM | POA: Diagnosis not present

## 2021-01-11 DIAGNOSIS — J90 Pleural effusion, not elsewhere classified: Secondary | ICD-10-CM | POA: Diagnosis not present

## 2021-01-11 DIAGNOSIS — E869 Volume depletion, unspecified: Secondary | ICD-10-CM | POA: Diagnosis not present

## 2021-01-11 DIAGNOSIS — K562 Volvulus: Secondary | ICD-10-CM

## 2021-01-11 DIAGNOSIS — K565 Intestinal adhesions [bands], unspecified as to partial versus complete obstruction: Secondary | ICD-10-CM | POA: Diagnosis not present

## 2021-01-11 DIAGNOSIS — K56609 Unspecified intestinal obstruction, unspecified as to partial versus complete obstruction: Secondary | ICD-10-CM

## 2021-01-11 DIAGNOSIS — R1012 Left upper quadrant pain: Secondary | ICD-10-CM | POA: Diagnosis not present

## 2021-01-11 DIAGNOSIS — M419 Scoliosis, unspecified: Secondary | ICD-10-CM | POA: Diagnosis not present

## 2021-01-11 DIAGNOSIS — G8929 Other chronic pain: Secondary | ICD-10-CM

## 2021-01-11 DIAGNOSIS — Z90710 Acquired absence of both cervix and uterus: Secondary | ICD-10-CM

## 2021-01-11 DIAGNOSIS — E039 Hypothyroidism, unspecified: Secondary | ICD-10-CM | POA: Diagnosis present

## 2021-01-11 DIAGNOSIS — K56699 Other intestinal obstruction unspecified as to partial versus complete obstruction: Secondary | ICD-10-CM | POA: Diagnosis not present

## 2021-01-11 DIAGNOSIS — J9811 Atelectasis: Secondary | ICD-10-CM | POA: Diagnosis present

## 2021-01-11 DIAGNOSIS — R1111 Vomiting without nausea: Secondary | ICD-10-CM | POA: Diagnosis not present

## 2021-01-11 DIAGNOSIS — I110 Hypertensive heart disease with heart failure: Secondary | ICD-10-CM | POA: Diagnosis present

## 2021-01-11 DIAGNOSIS — K55029 Acute infarction of small intestine, extent unspecified: Secondary | ICD-10-CM | POA: Diagnosis not present

## 2021-01-11 DIAGNOSIS — Z20822 Contact with and (suspected) exposure to covid-19: Secondary | ICD-10-CM | POA: Diagnosis not present

## 2021-01-11 DIAGNOSIS — K55019 Acute (reversible) ischemia of small intestine, extent unspecified: Secondary | ICD-10-CM | POA: Diagnosis not present

## 2021-01-11 DIAGNOSIS — R140 Abdominal distension (gaseous): Secondary | ICD-10-CM | POA: Diagnosis not present

## 2021-01-11 DIAGNOSIS — Z4682 Encounter for fitting and adjustment of non-vascular catheter: Secondary | ICD-10-CM | POA: Diagnosis not present

## 2021-01-11 DIAGNOSIS — Z9049 Acquired absence of other specified parts of digestive tract: Secondary | ICD-10-CM

## 2021-01-11 DIAGNOSIS — K6389 Other specified diseases of intestine: Secondary | ICD-10-CM | POA: Diagnosis present

## 2021-01-11 DIAGNOSIS — K5939 Other megacolon: Secondary | ICD-10-CM | POA: Diagnosis not present

## 2021-01-11 DIAGNOSIS — K567 Ileus, unspecified: Secondary | ICD-10-CM | POA: Diagnosis not present

## 2021-01-11 DIAGNOSIS — Z803 Family history of malignant neoplasm of breast: Secondary | ICD-10-CM

## 2021-01-11 DIAGNOSIS — R6889 Other general symptoms and signs: Secondary | ICD-10-CM | POA: Diagnosis not present

## 2021-01-11 DIAGNOSIS — K449 Diaphragmatic hernia without obstruction or gangrene: Secondary | ICD-10-CM | POA: Diagnosis present

## 2021-01-11 DIAGNOSIS — R066 Hiccough: Secondary | ICD-10-CM | POA: Diagnosis not present

## 2021-01-11 DIAGNOSIS — Z743 Need for continuous supervision: Secondary | ICD-10-CM | POA: Diagnosis not present

## 2021-01-11 DIAGNOSIS — F419 Anxiety disorder, unspecified: Secondary | ICD-10-CM | POA: Diagnosis present

## 2021-01-11 DIAGNOSIS — M1990 Unspecified osteoarthritis, unspecified site: Secondary | ICD-10-CM | POA: Diagnosis present

## 2021-01-11 DIAGNOSIS — J189 Pneumonia, unspecified organism: Secondary | ICD-10-CM

## 2021-01-11 DIAGNOSIS — M797 Fibromyalgia: Secondary | ICD-10-CM | POA: Diagnosis present

## 2021-01-11 DIAGNOSIS — Z825 Family history of asthma and other chronic lower respiratory diseases: Secondary | ICD-10-CM

## 2021-01-11 DIAGNOSIS — R519 Headache, unspecified: Secondary | ICD-10-CM

## 2021-01-11 DIAGNOSIS — M47816 Spondylosis without myelopathy or radiculopathy, lumbar region: Secondary | ICD-10-CM | POA: Diagnosis not present

## 2021-01-11 DIAGNOSIS — R110 Nausea: Secondary | ICD-10-CM | POA: Diagnosis not present

## 2021-01-11 DIAGNOSIS — M4186 Other forms of scoliosis, lumbar region: Secondary | ICD-10-CM | POA: Diagnosis not present

## 2021-01-11 DIAGNOSIS — K589 Irritable bowel syndrome without diarrhea: Secondary | ICD-10-CM | POA: Diagnosis not present

## 2021-01-11 DIAGNOSIS — Z8249 Family history of ischemic heart disease and other diseases of the circulatory system: Secondary | ICD-10-CM

## 2021-01-11 DIAGNOSIS — R39198 Other difficulties with micturition: Secondary | ICD-10-CM | POA: Diagnosis present

## 2021-01-11 DIAGNOSIS — Z833 Family history of diabetes mellitus: Secondary | ICD-10-CM

## 2021-01-11 DIAGNOSIS — K559 Vascular disorder of intestine, unspecified: Secondary | ICD-10-CM | POA: Diagnosis not present

## 2021-01-11 DIAGNOSIS — Z8719 Personal history of other diseases of the digestive system: Secondary | ICD-10-CM | POA: Diagnosis not present

## 2021-01-11 DIAGNOSIS — K658 Other peritonitis: Secondary | ICD-10-CM | POA: Diagnosis not present

## 2021-01-11 DIAGNOSIS — G894 Chronic pain syndrome: Secondary | ICD-10-CM | POA: Diagnosis present

## 2021-01-11 DIAGNOSIS — K5669 Other partial intestinal obstruction: Secondary | ICD-10-CM | POA: Diagnosis not present

## 2021-01-11 DIAGNOSIS — R109 Unspecified abdominal pain: Secondary | ICD-10-CM | POA: Diagnosis not present

## 2021-01-11 DIAGNOSIS — R1084 Generalized abdominal pain: Secondary | ICD-10-CM | POA: Diagnosis not present

## 2021-01-11 LAB — COMPREHENSIVE METABOLIC PANEL
ALT: 22 U/L (ref 0–44)
AST: 29 U/L (ref 15–41)
Albumin: 4.8 g/dL (ref 3.5–5.0)
Alkaline Phosphatase: 40 U/L (ref 38–126)
Anion gap: 16 — ABNORMAL HIGH (ref 5–15)
BUN: 8 mg/dL (ref 8–23)
CO2: 23 mmol/L (ref 22–32)
Calcium: 10 mg/dL (ref 8.9–10.3)
Chloride: 89 mmol/L — ABNORMAL LOW (ref 98–111)
Creatinine, Ser: 0.47 mg/dL (ref 0.44–1.00)
GFR, Estimated: >60 mL/min (ref >60)
Glucose, Bld: 182 mg/dL — ABNORMAL HIGH (ref 70–99)
Potassium: 3.1 mmol/L — ABNORMAL LOW (ref 3.5–5.1)
Sodium: 128 mmol/L — ABNORMAL LOW (ref 135–145)
Total Bilirubin: 0.8 mg/dL (ref 0.3–1.2)
Total Protein: 8 g/dL (ref 6.5–8.1)

## 2021-01-11 LAB — URINALYSIS, COMPLETE (UACMP) WITH MICROSCOPIC
Bacteria, UA: NONE SEEN
Bilirubin Urine: NEGATIVE
Glucose, UA: NEGATIVE mg/dL
Ketones, ur: 20 mg/dL — AB
Nitrite: NEGATIVE
Protein, ur: 100 mg/dL — AB
Specific Gravity, Urine: 1.015 (ref 1.005–1.030)
pH: 6 (ref 5.0–8.0)

## 2021-01-11 LAB — TSH: TSH: 0.996 u[IU]/mL (ref 0.350–4.500)

## 2021-01-11 LAB — GLUCOSE, CAPILLARY
Glucose-Capillary: 131 mg/dL — ABNORMAL HIGH (ref 70–99)
Glucose-Capillary: 139 mg/dL — ABNORMAL HIGH (ref 70–99)
Glucose-Capillary: 157 mg/dL — ABNORMAL HIGH (ref 70–99)

## 2021-01-11 LAB — CBC
HCT: 40.6 % (ref 36.0–46.0)
Hemoglobin: 14.5 g/dL (ref 12.0–15.0)
MCH: 34.3 pg — ABNORMAL HIGH (ref 26.0–34.0)
MCHC: 35.7 g/dL (ref 30.0–36.0)
MCV: 96 fL (ref 80.0–100.0)
Platelets: 289 10*3/uL (ref 150–400)
RBC: 4.23 MIL/uL (ref 3.87–5.11)
RDW: 12.4 % (ref 11.5–15.5)
WBC: 16.7 10*3/uL — ABNORMAL HIGH (ref 4.0–10.5)
nRBC: 0 % (ref 0.0–0.2)

## 2021-01-11 LAB — OSMOLALITY, URINE: Osmolality, Ur: 406 mOsm/kg (ref 300–900)

## 2021-01-11 LAB — BASIC METABOLIC PANEL
Anion gap: 10 (ref 5–15)
BUN: 7 mg/dL — ABNORMAL LOW (ref 8–23)
CO2: 25 mmol/L (ref 22–32)
Calcium: 8.4 mg/dL — ABNORMAL LOW (ref 8.9–10.3)
Chloride: 90 mmol/L — ABNORMAL LOW (ref 98–111)
Creatinine, Ser: 0.44 mg/dL (ref 0.44–1.00)
GFR, Estimated: >60 mL/min (ref >60)
Glucose, Bld: 138 mg/dL — ABNORMAL HIGH (ref 70–99)
Potassium: 4.2 mmol/L (ref 3.5–5.1)
Sodium: 125 mmol/L — ABNORMAL LOW (ref 135–145)

## 2021-01-11 LAB — PHOSPHORUS: Phosphorus: 2.6 mg/dL (ref 2.5–4.6)

## 2021-01-11 LAB — MAGNESIUM: Magnesium: 1.6 mg/dL — ABNORMAL LOW (ref 1.7–2.4)

## 2021-01-11 LAB — HEMOGLOBIN A1C
Hgb A1c MFr Bld: 5.3 % (ref 4.8–5.6)
Mean Plasma Glucose: 105.41 mg/dL

## 2021-01-11 LAB — LACTIC ACID, PLASMA: Lactic Acid, Venous: 1.3 mmol/L (ref 0.5–1.9)

## 2021-01-11 LAB — RESP PANEL BY RT-PCR (FLU A&B, COVID) ARPGX2
Influenza A by PCR: NEGATIVE
Influenza B by PCR: NEGATIVE
SARS Coronavirus 2 by RT PCR: NEGATIVE

## 2021-01-11 LAB — LIPASE, BLOOD: Lipase: 21 U/L (ref 11–51)

## 2021-01-11 LAB — SODIUM, URINE, RANDOM: Sodium, Ur: <10 mmol/L

## 2021-01-11 MED ORDER — MAGNESIUM SULFATE 2 GM/50ML IV SOLN
2.0000 g | Freq: Once | INTRAVENOUS | Status: AC
  Administered 2021-01-11: 2 g via INTRAVENOUS
  Filled 2021-01-11: qty 50

## 2021-01-11 MED ORDER — ENOXAPARIN SODIUM 40 MG/0.4ML IJ SOSY
40.0000 mg | PREFILLED_SYRINGE | INTRAMUSCULAR | Status: DC
  Administered 2021-01-12 – 2021-01-21 (×10): 40 mg via SUBCUTANEOUS
  Filled 2021-01-11 (×11): qty 0.4

## 2021-01-11 MED ORDER — MENTHOL 3 MG MT LOZG
1.0000 | LOZENGE | OROMUCOSAL | Status: DC | PRN
  Administered 2021-01-11: 3 mg via ORAL
  Filled 2021-01-11: qty 9

## 2021-01-11 MED ORDER — SODIUM CHLORIDE 0.9 % IV SOLN
12.5000 mg | Freq: Three times a day (TID) | INTRAVENOUS | Status: DC | PRN
  Administered 2021-01-11 – 2021-01-14 (×2): 12.5 mg via INTRAVENOUS
  Filled 2021-01-11: qty 0.5
  Filled 2021-01-11 (×2): qty 12.5

## 2021-01-11 MED ORDER — HYDROMORPHONE HCL 1 MG/ML IJ SOLN
1.0000 mg | Freq: Once | INTRAMUSCULAR | Status: AC
  Administered 2021-01-11: 1 mg via INTRAVENOUS
  Filled 2021-01-11: qty 1

## 2021-01-11 MED ORDER — FENTANYL CITRATE (PF) 100 MCG/2ML IJ SOLN
100.0000 ug | Freq: Once | INTRAMUSCULAR | Status: AC
  Administered 2021-01-11: 100 ug via INTRAVENOUS
  Filled 2021-01-11: qty 2

## 2021-01-11 MED ORDER — POTASSIUM CHLORIDE IN NACL 20-0.9 MEQ/L-% IV SOLN
INTRAVENOUS | Status: DC
  Filled 2021-01-11: qty 1000

## 2021-01-11 MED ORDER — LACTATED RINGERS IV BOLUS
1000.0000 mL | Freq: Once | INTRAVENOUS | Status: AC
  Administered 2021-01-11: 1000 mL via INTRAVENOUS

## 2021-01-11 MED ORDER — HYDROMORPHONE HCL 1 MG/ML IJ SOLN
1.0000 mg | INTRAMUSCULAR | Status: DC | PRN
  Administered 2021-01-11: 1 mg via INTRAVENOUS
  Filled 2021-01-11: qty 1

## 2021-01-11 MED ORDER — ONDANSETRON HCL 4 MG/2ML IJ SOLN
4.0000 mg | Freq: Four times a day (QID) | INTRAMUSCULAR | Status: DC | PRN
  Administered 2021-01-11 – 2021-01-18 (×12): 4 mg via INTRAVENOUS
  Filled 2021-01-11 (×12): qty 2

## 2021-01-11 MED ORDER — SODIUM CHLORIDE 0.9 % IV BOLUS (SEPSIS)
1000.0000 mL | Freq: Once | INTRAVENOUS | Status: AC
  Administered 2021-01-11: 1000 mL via INTRAVENOUS

## 2021-01-11 MED ORDER — PANTOPRAZOLE SODIUM 40 MG IV SOLR
40.0000 mg | Freq: Once | INTRAVENOUS | Status: AC
  Administered 2021-01-11: 40 mg via INTRAVENOUS
  Filled 2021-01-11: qty 40

## 2021-01-11 MED ORDER — HYDRALAZINE HCL 20 MG/ML IJ SOLN: 10.0000 mg | Freq: Four times a day (QID) | INTRAMUSCULAR | Status: DC | PRN

## 2021-01-11 MED ORDER — ONDANSETRON HCL 4 MG PO TABS: 4.0000 mg | ORAL_TABLET | Freq: Four times a day (QID) | ORAL | Status: DC | PRN

## 2021-01-11 MED ORDER — ONDANSETRON HCL 4 MG/2ML IJ SOLN
4.0000 mg | Freq: Once | INTRAMUSCULAR | Status: AC
  Administered 2021-01-11: 4 mg via INTRAVENOUS
  Filled 2021-01-11: qty 2

## 2021-01-11 MED ORDER — HYDROMORPHONE HCL 1 MG/ML IJ SOLN
0.5000 mg | INTRAMUSCULAR | Status: DC | PRN
  Administered 2021-01-11: 0.5 mg via INTRAVENOUS
  Filled 2021-01-11: qty 0.5

## 2021-01-11 MED ORDER — ONDANSETRON HCL 4 MG/2ML IJ SOLN: 4.0000 mg | Freq: Four times a day (QID) | INTRAMUSCULAR | Status: DC | PRN

## 2021-01-11 MED ORDER — LORAZEPAM 2 MG/ML IJ SOLN
0.5000 mg | Freq: Three times a day (TID) | INTRAMUSCULAR | Status: DC | PRN
  Administered 2021-01-12 – 2021-01-19 (×6): 0.5 mg via INTRAVENOUS
  Filled 2021-01-11 (×6): qty 1

## 2021-01-11 MED ORDER — POTASSIUM CHLORIDE 10 MEQ/100ML IV SOLN
10.0000 meq | INTRAVENOUS | Status: AC
  Administered 2021-01-11 (×3): 10 meq via INTRAVENOUS
  Filled 2021-01-11 (×3): qty 100

## 2021-01-11 MED ORDER — HYDROMORPHONE HCL 1 MG/ML IJ SOLN
1.0000 mg | INTRAMUSCULAR | Status: DC | PRN
Start: 2021-01-11 — End: 2021-01-16
  Administered 2021-01-11 – 2021-01-16 (×16): 1 mg via INTRAVENOUS
  Filled 2021-01-11 (×16): qty 1

## 2021-01-11 MED ORDER — PANTOPRAZOLE SODIUM 40 MG IV SOLR
40.0000 mg | INTRAVENOUS | Status: DC
  Administered 2021-01-11 – 2021-01-22 (×12): 40 mg via INTRAVENOUS
  Filled 2021-01-11 (×11): qty 40

## 2021-01-11 MED ORDER — PANTOPRAZOLE SODIUM 40 MG IV SOLR
40.0000 mg | Freq: Once | INTRAVENOUS | Status: DC
  Filled 2021-01-11: qty 40

## 2021-01-11 MED ORDER — KCL IN DEXTROSE-NACL 40-5-0.9 MEQ/L-%-% IV SOLN
INTRAVENOUS | Status: DC
Start: 2021-01-11 — End: 2021-01-13
  Filled 2021-01-11 (×6): qty 1000

## 2021-01-11 MED ORDER — DIATRIZOATE MEGLUMINE & SODIUM 66-10 % PO SOLN
90.0000 mL | Freq: Once | ORAL | Status: AC
  Administered 2021-01-11: 90 mL via NASOGASTRIC
  Filled 2021-01-11 (×2): qty 90

## 2021-01-11 NOTE — ED Triage Notes (Signed)
Pt to ED by EMS from home with c/o generalized abdominal pain, NV denies diarrhea. Symptoms began yesterday at approx dinner time.

## 2021-01-11 NOTE — Progress Notes (Signed)
Dr. Myles Rosenthal aware pt c/o sore throat w/tube. MD placed order for Lozenges.

## 2021-01-11 NOTE — Consult Note (Signed)
Reason for Consult:Small bowel obstruction Referring Physician: Dr. Dessa Phi  Laura Barnes is an 80 y.o. female.  HPI: This is an 80 year old female with multiple chronic medial issues who presents with acute onset of left-sided abdominal pain over the last day.  Some nausea/ vomiting.  No chest pain or shortness of breath.  She presented to the ED for evaluation.  Labs showed some volume depletion with hyponatremia, hypokalemia, and leukocytosis.  COVID negative.  Previous abdominal surgery - hysterectomy and cholecystectomy  CT showed a large amount of stool throughout the colon, multiple dilated SB loops with a transition point in the left lower abdomen.   Past Medical History:  Diagnosis Date   ALLERGIC RHINITIS    Allergy    Anemia    ANXIETY    Arthritis    hands   Bronchitis    Chronic fatigue fibromyalgia syndrome    Complication of anesthesia    says one time waking up she couldn't breathe, felt like her throat closing up   DEPRESSION    Enthesopathy of hip region    Family history of anesthesia complication    sister with n/v   FOOT PAIN, BILATERAL    GERD    Hiatal hernia    HYPERLIPIDEMIA    Hypothyroidism    IBS    MIGRAINE HEADACHE    MIGRAINE, COMMON    NECK MASS    OSTEOPENIA    OSTEOPOROSIS    PLANTAR FASCIITIS    RASH-NONVESICULAR    SINUSITIS- ACUTE-NOS    SKIN LESION    Urticaria    VAGINITIS    VENOUS INSUFFICIENCY, CHRONIC     Past Surgical History:  Procedure Laterality Date   APPENDECTOMY     BREAST ENHANCEMENT SURGERY     BREAST IMPLANT REMOVAL     CHOLECYSTECTOMY     COLONOSCOPY     FOOT SURGERY  11/06/2016   MASTECTOMY     bilateral for severe bilateral fibrocystic disease   OOPHORECTOMY     s/p neck lump removal     SHOULDER SURGERY     SINUS ENDO W/FUSION Right 06/30/2013   Procedure: RIGHT ENDOSCOPIC SPHENOIDECTOMY WITH FUSION SCAN;  Surgeon: [name], MD;  Location: [facility] OR;  Service: ENT;  Laterality: Right;    TONSILLECTOMY     VAGINAL HYSTERECTOMY      Family History  Problem Relation Age of Onset   Diabetes Mother    Heart disease Father    Diabetes Father    Diabetes Sister    Heart disease Maternal Aunt    Heart disease Maternal Uncle        x 2   Kidney disease Paternal Uncle        questionable   Asthma Maternal Grandmother    Breast cancer Maternal Grandmother    Heart disease Son    Healthy Son    Irritable bowel syndrome Son    Colon cancer Neg Hx    Colon polyps Neg Hx    Rectal cancer Neg Hx    Stomach cancer Neg Hx     Social History:  reports that she has never smoked. She has never used smokeless tobacco. She reports that she does not drink alcohol and does not use drugs.  Allergies:  Allergies  Allergen Reactions   Prednisone Anaphylaxis    Patient unable to remember why she needed to steroid shot but just knows that when she got back to work she started having a  lot of trouble breathing.  (jkl 05/04/14)  Multiple epidural steroid injections with depomedrol with no issues. (05/04/18)   Amoxicillin-Pot Clavulanate Hives and Diarrhea    Has patient had a PCN reaction causing immediate rash, facial/tongue/throat swelling, SOB or lightheadedness with hypotension: Unknown Has patient had a PCN reaction causing severe rash involving mucus membranes or skin necrosis: Unknown Has patient had a PCN reaction that required hospitalization: Unknown Has patient had a PCN reaction occurring within the last 10 years: Unknown If all of the above answers are "NO", then may proceed with Cephalosporin use.    Aspirin Other (See Comments)    Stomach ache and bleed   Erythromycin Diarrhea   Levofloxacin Other (See Comments)    Headaches, GI upset   Nsaids Other (See Comments)    stomach bleeding per pt   Statins Nausea Only and Other (See Comments)    Headache, upset stomach, joint hurt, heartburn   Sumatriptan Hives and Palpitations   Adhesive [Tape] Other (See Comments)     blisters   Ivp Dye [Iodinated Diagnostic Agents]     If made with blue shellfish then CAN NOT have this type of dye.  Pt was given contrast 10/12/17, not premedicated, had NO COMPLICATIONS- BLO, CT   Lyrica [Pregabalin]    Methylphenidate Hcl     Unknown reaction   Mirtazapine     Unknown reaction   Oxycodone     Itching    Red Yeast Rice [Cholestin] Other (See Comments)    Pt reports causes aching pains like statins do   Shellfish Allergy     Blue shellfish   Soy Allergy     Unknown reaction   Doxycycline Diarrhea   Hydrocodone Itching   Hydrocodone-Acetaminophen Itching   Latex Other (See Comments)    blisters   Rofecoxib Nausea Only   Sertraline Hcl Nausea Only   Sulfonamide Derivatives Itching    Medications:  Prior to Admission medications   Medication Sig Start Date End Date Taking? Authorizing Provider  acetaminophen (TYLENOL) 500 MG tablet Take 1,000 mg by mouth every 6 (six) hours as needed for mild pain, moderate pain, fever or headache.    [provider]  ALPRAZolam (XANAX) 0.5 MG tablet TAKE 1 TABLET(0.5 MG) BY MOUTH THREE TIMES DAILY AS NEEDED FOR ANXIETY 01/02/21   Burns, Claudina Lick, MD  Amino Acids (AMINO ACID PO) Take 1 capsule by mouth 2 (two) times daily.    [provider]  Ascorbic Acid (VITAMIN C) 1000 MG tablet Take 1,000 mg by mouth 2 (two) times daily.    [provider]  B Complex-Biotin-FA (B COMPLETE) TABS Take 1 tablet by mouth daily. B Complete '100mg'$     [provider]  benzonatate (TESSALON) 200 MG capsule TAKE 1 CAPSULE(200 MG) BY MOUTH THREE TIMES DAILY AS NEEDED 12/04/20   Binnie Rail, MD  buPROPion (WELLBUTRIN XL) 300 MG 24 hr tablet TAKE 1 TABLET(300 MG) BY MOUTH DAILY 10/09/20   Binnie Rail, MD  Cholecalciferol (VITAMIN D3) 1000 units CAPS Take 1,000 Units by mouth daily.    [provider]  Coenzyme Q10 (CO Q-10) 200 MG CAPS Take 1 capsule by mouth 2 (two) times daily.    [provider]   ECHINACEA PO Take 1 tablet by mouth 2 (two) times daily.    [provider]  ELDERBERRY PO Take by mouth daily.    [provider]  estradiol (ESTRACE) 0.5 MG tablet TAKE 1 TABLET(0.5 MG) BY MOUTH  DAILY 04/08/20   Binnie Rail, MD  fluticasone (FLONASE) 50 MCG/ACT nasal spray Place 2 sprays into both nostrils as needed.  07/11/19   [provider]  Eloise Levels, Camellia sinensis, POWD Take 1 capsule by mouth daily.    [provider]  Homeopathic Products (ARNICA [place]) PLLT Take by mouth daily.    [provider]  HYDROmorphone (DILAUDID) 2 MG tablet Take 2 mg by mouth 5 (five) times daily as needed. 11/27/20   [provider]  hydrOXYzine (VISTARIL) 25 MG capsule Take 1 capsule (25 mg total) by mouth every 8 (eight) hours as needed for itching. 10/18/20   Burns, Claudina Lick, MD  KRILL OIL PO Take by mouth daily.    [provider]  LECITHIN PO Take 1 tablet by mouth daily.    [provider]  levothyroxine (SYNTHROID) 75 MCG tablet TAKE 1 TABLET BY MOUTH EVERY MORNING 12/04/20   Burns, Claudina Lick, MD  MAGNESIUM ASPARTATE PO Take 1 tablet by mouth 2 (two) times daily.    [provider]  meclizine (ANTIVERT) 25 MG tablet Take 1 tablet (25 mg total) by mouth 3 (three) times daily as needed for dizziness. 02/20/19   Binnie Rail, MD  Misc. Devices (ROLLATOR) MISC Use rollator for ambulation 12/20/17   Lyndal Pulley, DO  Multiple Vitamins-Minerals (ZINC PO) Take by mouth daily.    [provider]  naloxone [place]) nasal spray 4 mg/0.1 mL SMARTSIG:Both Nares 09/24/20   [provider]  ondansetron (ZOFRAN) 4 MG tablet TAKE 1 TABLET(4 MG) BY MOUTH EVERY 8 HOURS AS NEEDED FOR NAUSEA OR VOMITING 11/17/19   Burns, Claudina Lick, MD  ondansetron (ZOFRAN-ODT) 8 MG disintegrating tablet DISSOLVE 1 TABLET(8 MG) ON THE TONGUE EVERY 8 HOURS AS NEEDED FOR NAUSEA OR VOMITING 12/20/19   Binnie Rail, MD  POTASSIUM AMINOBENZOATE  PO Take 1 tablet by mouth daily.    [provider]  triamterene-hydrochlorothiazide (MAXZIDE-25) 37.5-25 MG tablet TAKE 1 TABLET BY MOUTH EVERY DAY 12/04/20   Binnie Rail, MD  TURMERIC PO Take 1 tablet by mouth 2 (two) times daily.     [provider]  vitamin E 400 UNIT capsule Take 400 Units by mouth daily.    [provider]     Results for orders placed or performed during the hospital encounter of 01/11/21 (from the past 48 hour(s))  Lipase, blood     Status: None   Collection Time: 01/11/21  3:21 AM  Result Value Ref Range   Lipase 21 11 - 51 U/L    Comment: Performed at [facility], [place] [address], [place], [place] [address]  Comprehensive metabolic panel     Status: Abnormal   Collection Time: 01/11/21  3:21 AM  Result Value Ref Range   Sodium 128 (L) 135 - 145 mmol/L   Potassium 3.1 (L) 3.5 - 5.1 mmol/L   Chloride 89 (L) 98 - 111 mmol/L   CO2 23 22 - 32 mmol/L   Glucose, Bld 182 (H) 70 - 99 mg/dL    Comment: Glucose reference range applies only to samples taken after fasting for at least 8 hours.   BUN 8 8 - 23 mg/dL   Creatinine, Ser 0.47 0.44 - 1.00 mg/dL   Calcium 10.0 8.9 - 10.3 mg/dL   Total Protein 8.0 6.5 - 8.1 g/dL   Albumin 4.8 3.5 - 5.0 g/dL   AST 29 15 - 41 U/L   ALT  22 0 - 44 U/L   Alkaline Phosphatase 40 38 - 126 U/L   Total Bilirubin 0.8 0.3 - 1.2 mg/dL   GFR, Estimated >60 >60 mL/min    Comment: (NOTE) Calculated using the CKD-EPI Creatinine Equation (2021)    Anion gap 16 (H) 5 - 15    Comment: Performed at [facility], [place] [address] [place], [place] [address]  CBC     Status: Abnormal   Collection Time: 01/11/21  3:21 AM  Result Value Ref Range   WBC 16.7 (H) 4.0 - 10.5 K/uL   RBC 4.23 3.87 - 5.11 MIL/uL   Hemoglobin 14.5 12.0 - 15.0 g/dL   HCT 40.6 36.0 - 46.0 %   MCV 96.0 80.0 - 100.0 fL   MCH 34.3 (H) 26.0 - 34.0 pg   MCHC 35.7 30.0 - 36.0 g/dL   RDW 12.4 11.5 - 15.5 %    Platelets 289 150 - 400 K/uL   nRBC 0.0 0.0 - 0.2 %    Comment: Performed at [facility], [place] [address], [place], [place] [address]  Resp Panel by RT-PCR (Flu A&B, Covid) Nasopharyngeal Swab     Status: None   Collection Time: 01/11/21  5:47 AM   Specimen: Nasopharyngeal Swab; Nasopharyngeal(NP) swabs in vial transport medium  Result Value Ref Range   SARS Coronavirus 2 by RT PCR NEGATIVE NEGATIVE    Comment: (NOTE) SARS-CoV-2 target nucleic acids are NOT DETECTED.  The SARS-CoV-2 RNA is generally detectable in upper respiratory specimens during the acute phase of infection. The lowest concentration of SARS-CoV-2 viral copies this assay can detect is 138 copies/mL. A negative result does not preclude SARS-Cov-2 infection and should not be used as the sole basis for treatment or other patient management decisions. A negative result may occur with  improper specimen collection/handling, submission of specimen other than nasopharyngeal swab, presence of viral mutation(s) within the areas targeted by this assay, and inadequate number of viral copies(<138 copies/mL). A negative result must be combined with clinical observations, patient history, and epidemiological information. The expected result is Negative.  Fact Sheet for Patients:  EntrepreneurPulse.com.au  Fact Sheet for Healthcare Providers:  IncredibleEmployment.be  This test is no t yet approved or cleared by the [place] FDA and  has been authorized for detection and/or diagnosis of SARS-CoV-2 by FDA under an Emergency Use Authorization (EUA). This EUA will remain  in effect (meaning this test can be used) for the duration of the COVID-19 declaration under Section 564(b)(1) of the Act, 21 U.S.C.section 360bbb-3(b)(1), unless the authorization is terminated  or revoked sooner.       Influenza A by PCR NEGATIVE NEGATIVE   Influenza B by PCR NEGATIVE NEGATIVE     Comment: (NOTE) The Xpert Xpress SARS-CoV-2/FLU/RSV plus assay is intended as an aid in the diagnosis of influenza from Nasopharyngeal swab specimens and should not be used as a sole basis for treatment. Nasal washings and aspirates are unacceptable for Xpert Xpress SARS-CoV-2/FLU/RSV testing.  Fact Sheet for Patients: EntrepreneurPulse.com.au  Fact Sheet for Healthcare Providers: IncredibleEmployment.be  This test is not yet approved or cleared by the [place] FDA and has been authorized for detection and/or diagnosis of SARS-CoV-2 by FDA under an Emergency Use Authorization (EUA). This EUA will remain in effect (meaning this test can be used) for the duration of the COVID-19 declaration under Section 564(b)(1) of the Act, 21 U.S.C. section 360bbb-3(b)(1), unless the authorization is terminated or revoked.  Performed at  [facility], [place] [address] [place], [place] [address]     CT ABDOMEN PELVIS WO CONTRAST  Result Date: 01/11/2021 CLINICAL DATA:  Generalized abdominal pain with nausea and vomiting. EXAM: CT ABDOMEN AND PELVIS WITHOUT CONTRAST TECHNIQUE: Multidetector CT imaging of the abdomen and pelvis was performed following the standard protocol without IV contrast. COMPARISON:  November 15, 2015 FINDINGS: Lower chest: Breast implants are noted. Hepatobiliary: No focal liver abnormality is seen. Status post cholecystectomy. Mild central intrahepatic biliary dilatation is seen. Pancreas: Unremarkable. No pancreatic ductal dilatation or surrounding inflammatory changes. Spleen: Normal in size without focal abnormality. Adrenals/Urinary Tract: Adrenal glands are unremarkable. Kidneys are normal, without renal calculi, focal lesion, or hydronephrosis. The urinary bladder is distended and otherwise unremarkable. Stomach/Bowel: There is a small hiatal hernia. The appendix is surgically absent. A large amount of stool is seen  throughout the colon. Multiple small bowel loops within the mid and upper abdomen are predominant fluid-filled and post the upper limit of normal caliber. Dilated small bowel loops are seen within the posterior aspect of the pelvis on the left (maximum small bowel diameter of approximately 3.1 cm). These involve loops of distal jejunum that extend along the region posterior to the urinary bladder on the left. A transition zone is poorly visualized but is located within the medial aspect of the left lower quadrant (axial CT images 48 through 52, CT series 2/sagittal reformatted images 103 through 114, CT series 6). Vascular/Lymphatic: Aortic atherosclerosis. No enlarged abdominal or pelvic lymph nodes. Reproductive: Status post hysterectomy. No adnexal masses. Other: No abdominal wall hernia or abnormality. No abdominopelvic ascites or free air. Musculoskeletal: Marked severity multilevel degenerative changes are seen throughout the lumbar spine. IMPRESSION: 1. Small bowel obstruction originating within the distal jejunum, with a transition zone noted within the left lower quadrant. 2. Small hiatal hernia. 3. Evidence of prior cholecystectomy and prior hysterectomy. Electronically Signed   By: Virgina Norfolk M.D.   On: 01/11/2021 05:32    Review of Systems Review of Systems  Constitutional:  Negative for fever.  Respiratory:  Negative for shortness of breath.   Cardiovascular:  Negative for chest pain.  Gastrointestinal:  Positive for abdominal pain, constipation, nausea and vomiting. Negative for diarrhea.  Genitourinary:  Negative for dysuria.  All other systems reviewed and are negative.  Blood pressure (!) 190/93, pulse 86, temperature (!) 97.5 F (36.4 C), temperature source Oral, resp. rate (!) 24, SpO2 95 %. Physical Exam Constitutional:  Elderly female in NAD, conversant, lying in bed comfortably Eyes:  Pupils equal, round; sclera anicteric; moist conjunctiva; no lid lag HENT:  Oral mucosa  moist; good dentition  Neck:  No masses palpated, trachea midline; no thyromegaly Lungs:  CTA bilaterally; normal respiratory effort CV:  Regular rate and rhythm; no murmurs; extremities well-perfused with no edema Abd:  distended; no peritonitis; palpable stool in cecum; healed incisions with no hernias Musc:  Unable to assess gait; no apparent clubbing or cyanosis in extremities Lymphatic:  No palpable cervical or axillary lymphadenopathy Skin:  Warm, dry; no sign of jaundice Psychiatric - alert and oriented x 4; calm mood and affect  Assessment/Plan: Small bowel obstruction - likely secondary to adhesions from previous hysterectomy  IV hydration - replete electrolytes NG decompression SBO protocol with gastrografin.  I have placed the orders for this.  If patient is unable to void, would place Foley catheter to decompress bladder, which seems distended on CT scan.  We will follow during hospitalization  Laura Barnes  Laura Barnes 01/11/2021, 7:13 AM

## 2021-01-11 NOTE — Progress Notes (Signed)
Md aware pain unrelieved. I asked if we could go up on the dilaudid. MD placed order.

## 2021-01-11 NOTE — H&P (Addendum)
History and Physical    Laura Barnes [id number] DOB: January 08, 1941 DOA: 01/11/2021  PCP: Laura Rail, MD  Patient coming from:home  I have personally briefly reviewed patient's old medical records in [place]  Chief Complaint: abdominal pain n/v  HPI: Laura Barnes is a 80 y.o. female with medical history significant of multiple drug intolerances/allergies followed by allergy,NSAId induced GI bleeding, Anemia, Anxiety ,fibromyalgia ,depression,gerd, hypothyrodism, HLD, Migraines, djd of the spine , Grade 1 diastolic dysfunction, IBS,chronic pain syndrome who presents to ed with generalized abdominal pain and n/v hours PTA. Patient notes pain started after dinner and is located on the left side of her abdomen. She describes pain as aching and associated with n/v/ and constipation. She notes no fever/chills/chest pain / sob / cough or diarrhea/dark stools blood in stools, dysuria or difficulty with her urine.  She note not similar episodes in the past. On evaluation in ed patient was found to have small bowel obstruction.  Surgery Dr Georgette Dover was consulted who recommended admit to hospitalist service with surgery consult.  Patient currently s/p NG tube and pain medications notes pain is tolerable. She does some difficulty with urine but states she has had these issue in the past.   ED Course:  Afeb,  bp 190/86, hr 89, rr 20  Labs: Wbc:16, hgb 14.5, plt289 Lipase 21 CMP : 128, (132) K 3.1 , CL 89, glu 182 , cr 0.47  CT abd/pelvis IMPRESSION: 1. Small bowel obstruction originating within the distal jejunum, with a transition zone noted within the left lower quadrant. 2. Small hiatal hernia. 3. Evidence of prior cholecystectomy and prior hysterectomy.  Tx fentanyl 148mg , zofran, diluadid  ECN:[id number], rae , nonspecific t wave ( poor tracing) Review of Systems: As per HPI otherwise 10 point review of systems negative.   Past Medical History:  Diagnosis Date    ALLERGIC RHINITIS    Allergy    Anemia    ANXIETY    Arthritis    hands   Bronchitis    Chronic fatigue fibromyalgia syndrome    Complication of anesthesia    says one time waking up she couldn't breathe, felt like her throat closing up   DEPRESSION    Enthesopathy of hip region    Family history of anesthesia complication    sister with n/v   FOOT PAIN, BILATERAL    GERD    Hiatal hernia    HYPERLIPIDEMIA    Hypothyroidism    IBS    MIGRAINE HEADACHE    MIGRAINE, COMMON    NECK MASS    OSTEOPENIA    OSTEOPOROSIS    PLANTAR FASCIITIS    RASH-NONVESICULAR    SINUSITIS- ACUTE-NOS    SKIN LESION    Urticaria    VAGINITIS    VENOUS INSUFFICIENCY, CHRONIC     Past Surgical History:  Procedure Laterality Date   APPENDECTOMY     BREAST ENHANCEMENT SURGERY     BREAST IMPLANT REMOVAL     CHOLECYSTECTOMY     COLONOSCOPY     FOOT SURGERY  11/06/2016   MASTECTOMY     bilateral for severe bilateral fibrocystic disease   OOPHORECTOMY     s/p neck lump removal     SHOULDER SURGERY     SINUS ENDO W/FUSION Right 06/30/2013   Procedure: RIGHT ENDOSCOPIC SPHENOIDECTOMY WITH FUSION SCAN;  Surgeon: DJerrell Belfast MD;  Location: [facility]  Service: ENT;  Laterality: Right;   TONSILLECTOMY  VAGINAL HYSTERECTOMY       reports that she has never smoked. She has never used smokeless tobacco. She reports that she does not drink alcohol and does not use drugs.  Allergies  Allergen Reactions   Prednisone Anaphylaxis    Patient unable to remember why she needed to steroid shot but just knows that when she got back to work she started having a lot of trouble breathing.  (jkl 05/04/14)  Multiple epidural steroid injections with depomedrol with no issues. (05/04/18)   Amoxicillin-Pot Clavulanate Hives and Diarrhea    Has patient had a PCN reaction causing immediate rash, facial/tongue/throat swelling, SOB or lightheadedness with hypotension: Unknown Has patient had a PCN reaction causing  severe rash involving mucus membranes or skin necrosis: Unknown Has patient had a PCN reaction that required hospitalization: Unknown Has patient had a PCN reaction occurring within the last 10 years: Unknown If all of the above answers are "NO", then may proceed with Cephalosporin use.    Aspirin Other (See Comments)    Stomach ache and bleed   Erythromycin Diarrhea   Levofloxacin Other (See Comments)    Headaches, GI upset   Nsaids Other (See Comments)    stomach bleeding per pt   Statins Nausea Only and Other (See Comments)    Headache, upset stomach, joint hurt, heartburn   Sumatriptan Hives and Palpitations   Adhesive [Tape] Other (See Comments)    blisters   Ivp Dye [Iodinated Diagnostic Agents]     If made with blue shellfish then CAN NOT have this type of dye.  Pt was given contrast 10/12/17, not premedicated, had NO COMPLICATIONS- BLO, CT   Lyrica [Pregabalin]    Methylphenidate Hcl     Unknown reaction   Mirtazapine     Unknown reaction   Oxycodone     Itching    Red Yeast Rice [Cholestin] Other (See Comments)    Pt reports causes aching pains like statins do   Shellfish Allergy     Blue shellfish   Soy Allergy     Unknown reaction   Doxycycline Diarrhea   Hydrocodone Itching   Hydrocodone-Acetaminophen Itching   Latex Other (See Comments)    blisters   Rofecoxib Nausea Only   Sertraline Hcl Nausea Only   Sulfonamide Derivatives Itching    Family History  Problem Relation Age of Onset   Diabetes Mother    Heart disease Father    Diabetes Father    Diabetes Sister    Heart disease Maternal Aunt    Heart disease Maternal Uncle        x 2   Kidney disease Paternal Uncle        questionable   Asthma Maternal Grandmother    Breast cancer Maternal Grandmother    Heart disease Son    Healthy Son    Irritable bowel syndrome Son    Colon cancer Neg Hx    Colon polyps Neg Hx    Rectal cancer Neg Hx    Stomach cancer Neg Hx     Prior to Admission  medications   Medication Sig Start Date End Date Taking? Authorizing Provider  acetaminophen (TYLENOL) 500 MG tablet Take 1,000 mg by mouth every 6 (six) hours as needed for mild pain, moderate pain, fever or headache.    [provider]  ALPRAZolam (XANAX) 0.5 MG tablet TAKE 1 TABLET(0.5 MG) BY MOUTH THREE TIMES DAILY AS NEEDED FOR ANXIETY 01/02/21   Burns, Claudina Lick, MD  Amino Acids (  AMINO ACID PO) Take 1 capsule by mouth 2 (two) times daily.    [provider]  Ascorbic Acid (VITAMIN C) 1000 MG tablet Take 1,000 mg by mouth 2 (two) times daily.    [provider]  B Complex-Biotin-FA (B COMPLETE) TABS Take 1 tablet by mouth daily. B Complete '100mg'$     [provider]  benzonatate (TESSALON) 200 MG capsule TAKE 1 CAPSULE(200 MG) BY MOUTH THREE TIMES DAILY AS NEEDED 12/04/20   Laura Rail, MD  buPROPion (WELLBUTRIN XL) 300 MG 24 hr tablet TAKE 1 TABLET(300 MG) BY MOUTH DAILY 10/09/20   Laura Rail, MD  Cholecalciferol (VITAMIN D3) 1000 units CAPS Take 1,000 Units by mouth daily.    [provider]  Coenzyme Q10 (CO Q-10) 200 MG CAPS Take 1 capsule by mouth 2 (two) times daily.    [provider]  ECHINACEA PO Take 1 tablet by mouth 2 (two) times daily.    [provider]  ELDERBERRY PO Take by mouth daily.    [provider]  estradiol (ESTRACE) 0.5 MG tablet TAKE 1 TABLET(0.5 MG) BY MOUTH DAILY 04/08/20   Burns, Claudina Lick, MD  fluticasone (FLONASE) 50 MCG/ACT nasal spray Place 2 sprays into both nostrils as needed.  07/11/19   [provider]  Eloise Levels, Camellia sinensis, POWD Take 1 capsule by mouth daily.    [provider]  Homeopathic Products (ARNICA [place]) PLLT Take by mouth daily.    [provider]  HYDROmorphone (DILAUDID) 2 MG tablet Take 2 mg by mouth 5 (five) times daily as needed. 11/27/20   [provider]  hydrOXYzine (VISTARIL) 25 MG capsule Take 1 capsule (25 mg total) by mouth  every 8 (eight) hours as needed for itching. 10/18/20   Burns, Claudina Lick, MD  KRILL OIL PO Take by mouth daily.    [provider]  LECITHIN PO Take 1 tablet by mouth daily.    [provider]  levothyroxine (SYNTHROID) 75 MCG tablet TAKE 1 TABLET BY MOUTH EVERY MORNING 12/04/20   Burns, Claudina Lick, MD  MAGNESIUM ASPARTATE PO Take 1 tablet by mouth 2 (two) times daily.    [provider]  meclizine (ANTIVERT) 25 MG tablet Take 1 tablet (25 mg total) by mouth 3 (three) times daily as needed for dizziness. 02/20/19   Laura Rail, MD  Misc. Devices (ROLLATOR) MISC Use rollator for ambulation 12/20/17   Lyndal Pulley, DO  Multiple Vitamins-Minerals (ZINC PO) Take by mouth daily.    [provider]  naloxone [facility]) nasal spray 4 mg/0.1 mL SMARTSIG:Both Nares 09/24/20   [provider]  ondansetron (ZOFRAN) 4 MG tablet TAKE 1 TABLET(4 MG) BY MOUTH EVERY 8 HOURS AS NEEDED FOR NAUSEA OR VOMITING 11/17/19   Burns, Claudina Lick, MD  ondansetron (ZOFRAN-ODT) 8 MG disintegrating tablet DISSOLVE 1 TABLET(8 MG) ON THE TONGUE EVERY 8 HOURS AS NEEDED FOR NAUSEA OR VOMITING 12/20/19   Laura Rail, MD  POTASSIUM AMINOBENZOATE PO Take 1 tablet by mouth daily.    [provider]  triamterene-hydrochlorothiazide (MAXZIDE-25) 37.5-25 MG tablet TAKE 1 TABLET BY MOUTH EVERY DAY 12/04/20   Laura Rail, MD  TURMERIC PO Take 1 tablet by mouth 2 (two) times daily.     [provider]  vitamin E 400 UNIT capsule Take 400 Units by mouth daily.    [provider]    Physical Exam: Vitals:   01/11/21 0330 01/11/21 0500  BP: Marland Kitchen)  190/86 (!) 190/93  Pulse: 89 86  Resp: 20 (!) 24  Temp: (!) 97.5 F (36.4 C)   TempSrc: Oral   SpO2: 100% 95%     Vitals:   01/11/21 0330 01/11/21 0500  BP: (!) 190/86 (!) 190/93  Pulse: 89 86  Resp: 20 (!) 24  Temp: (!) 97.5 F (36.4 C)   TempSrc: Oral   SpO2: 100% 95%  Constitutional: NAD, calm, comfortable Eyes:  PERRL, lids and conjunctivae normal ENMT: Mucous membranes are dry, Posterior pharynx clear of any exudate or lesions.Normal dentition.  Neck: normal, supple, no masses, no thyromegaly Respiratory: clear to auscultation bilaterally, no wheezing, no crackles. Normal respiratory effort. No accessory muscle use.  Cardiovascular: Regular rate and rhythm, no murmurs / rubs / gallops. No extremity edema. 2+ pedal pulses. Abdomen: +tenderness, ,distended bladder, no masses palpated. No hepatosplenomegaly. +Bowel sounds positive.  Musculoskeletal: no clubbing / cyanosis. No joint deformity upper and lower extremities. Good ROM, no contractures. Normal muscle tone.  Skin: no rashes, lesions, ulcers. No induration Neurologic: CN 2-12 grossly intact. Sensation intact, . Strength 5/5 in all 4.  Psychiatric: Normal judgment and insight. Alert and oriented x 3. Normal mood.    Labs on Admission: I have personally reviewed following labs and imaging studies  CBC: Recent Labs  Lab 01/11/21 0321  WBC 16.7*  HGB 14.5  HCT 40.6  MCV 96.0  PLT [id number]   Basic Metabolic Panel: Recent Labs  Lab 01/11/21 0321  NA 128*  K 3.1*  CL 89*  CO2 23  GLUCOSE 182*  BUN 8  CREATININE 0.47  CALCIUM 10.0   GFR: CrCl cannot be calculated (Unknown ideal weight.). Liver Function Tests: Recent Labs  Lab 01/11/21 0321  AST 29  ALT 22  ALKPHOS 40  BILITOT 0.8  PROT 8.0  ALBUMIN 4.8   Recent Labs  Lab 01/11/21 0321  LIPASE 21   No results for input(s): AMMONIA in the last 168 hours. Coagulation Profile: No results for input(s): INR, PROTIME in the last 168 hours. Cardiac Enzymes: No results for input(s): CKTOTAL, CKMB, CKMBINDEX, TROPONINI in the last 168 hours. BNP (last 3 results) No results for input(s): PROBNP in the last 8760 hours. HbA1C: No results for input(s): HGBA1C in the last 72 hours. CBG: No results for input(s): GLUCAP in the last 168 hours. Lipid Profile: No results for input(s):  CHOL, HDL, LDLCALC, TRIG, CHOLHDL, LDLDIRECT in the last 72 hours. Thyroid Function Tests: No results for input(s): TSH, T4TOTAL, FREET4, T3FREE, THYROIDAB in the last 72 hours. Anemia Panel: No results for input(s): VITAMINB12, FOLATE, FERRITIN, TIBC, IRON, RETICCTPCT in the last 72 hours. Urine analysis:    Component Value Date/Time   COLORURINE YELLOW 01/02/2019 1211   APPEARANCEUR CLEAR 01/02/2019 1211   LABSPEC 1.015 01/02/2019 1211   PHURINE 6.0 01/02/2019 1211   [place] 01/02/2019 1211   HGBUR NEGATIVE 01/02/2019 1211   BILIRUBINUR NEGATIVE 01/02/2019 1211   BILIRUBINUR neg 02/28/2016 1350   KETONESUR NEGATIVE 01/02/2019 1211   PROTEINUR neg 02/28/2016 1350   UROBILINOGEN 0.2 01/02/2019 1211   NITRITE NEGATIVE 01/02/2019 1211   LEUKOCYTESUR NEGATIVE 01/02/2019 1211    Radiological Exams on Admission: CT ABDOMEN PELVIS WO CONTRAST  Result Date: 01/11/2021 CLINICAL DATA:  Generalized abdominal pain with nausea and vomiting. EXAM: CT ABDOMEN AND PELVIS WITHOUT CONTRAST TECHNIQUE: Multidetector CT imaging of the abdomen and pelvis was performed following the standard protocol without IV contrast. COMPARISON:  November 15, 2015 FINDINGS: Lower chest: Breast implants  are noted. Hepatobiliary: No focal liver abnormality is seen. Status post cholecystectomy. Mild central intrahepatic biliary dilatation is seen. Pancreas: Unremarkable. No pancreatic ductal dilatation or surrounding inflammatory changes. Spleen: Normal in size without focal abnormality. Adrenals/Urinary Tract: Adrenal glands are unremarkable. Kidneys are normal, without renal calculi, focal lesion, or hydronephrosis. The urinary bladder is distended and otherwise unremarkable. Stomach/Bowel: There is a small hiatal hernia. The appendix is surgically absent. A large amount of stool is seen throughout the colon. Multiple small bowel loops within the mid and upper abdomen are predominant fluid-filled and post the upper  limit of normal caliber. Dilated small bowel loops are seen within the posterior aspect of the pelvis on the left (maximum small bowel diameter of approximately 3.1 cm). These involve loops of distal jejunum that extend along the region posterior to the urinary bladder on the left. A transition zone is poorly visualized but is located within the medial aspect of the left lower quadrant (axial CT images 48 through 52, CT series 2/sagittal reformatted images 103 through 114, CT series 6). Vascular/Lymphatic: Aortic atherosclerosis. No enlarged abdominal or pelvic lymph nodes. Reproductive: Status post hysterectomy. No adnexal masses. Other: No abdominal wall hernia or abnormality. No abdominopelvic ascites or free air. Musculoskeletal: Marked severity multilevel degenerative changes are seen throughout the lumbar spine. IMPRESSION: 1. Small bowel obstruction originating within the distal jejunum, with a transition zone noted within the left lower quadrant. 2. Small hiatal hernia. 3. Evidence of prior cholecystectomy and prior hysterectomy. Electronically Signed   By: Virgina Norfolk M.D.   On: 01/11/2021 05:32    EKG: Independently reviewed.   Assessment/Plan SBO  -Small bowel obstruction originating within the distal jejunum, with a transition zone noted within the left lower quadrant. -admit tele -ivfs -ensure electrolytes remain stable  -ng tube placed  -bid lytes, check lactic acid  -KUB in am  -supportive care with antiemetic and iv pain medication -f/u surgery recs  Leukocytosis -monitor , due to stress response  -cxr nad, ua pending  Hypokalemia -replete prn   Mild hyponatremia -due to low volume  -monitor labs , further work depending on trend.  -should stabilize with ivfs   HTN -uncontrolled,off medication/ pain  -treat pain  -prn hydralazine  -on maxzide as out patient  Mild elevation in glucose -check a1c  -place on glucose surveillance  Hypothroidism -check tsh   -if  low out put by am can resume oral synthroid   HFPef -diastolic grade 1 -noted on last echo  -no difficulty with fluid overload in the past   Anxiety/Depression -dailiy use of xanax  -place on iv ativan prn   Fibromylagia chronic pain  -supportive care with pain iv pain medications -resume home regimen as able   GERD -ppi   Anemia -currently stable    DVT prophylaxis: lovenox Code Status: FULL Family Communication: none at bedside Disposition Plan:patient  expected to be admitted greater than 2 midnights  Consults called: Dr Georgette Dover , surgery  Admission status: inpatent Karen Chafe   Clance Boll MD [facility]   If 7PM-7AM, please contact night-coverage [email or web address] Password [facility]  01/11/2021, 7:27 AM

## 2021-01-11 NOTE — Progress Notes (Signed)
Spoke w/MD about something else for nausea. Dr Myles Rosenthal enter order for Phenergan.

## 2021-01-11 NOTE — ED Provider Notes (Signed)
[place] DEPT Provider Note   CSN: [id number] Arrival date & time: 01/11/21  [id number]     History Chief Complaint  Patient presents with   Abdominal Pain    Laura Barnes is a 80 y.o. female.  The history is provided by the patient.  Abdominal Pain Pain location:  LUQ and LLQ Pain quality: aching   Pain radiates to:  Does not radiate Pain severity:  Severe Onset quality:  Sudden Duration:  12 hours Timing:  Constant Progression:  Worsening Chronicity:  New Relieved by:  Nothing Worsened by:  Movement and palpation Associated symptoms: constipation, nausea and vomiting   Associated symptoms: no chest pain, no diarrhea, no dysuria, no fever and no shortness of breath   Patient presents with abdominal pain.  She reports it is in her left upper and lower quadrants.  She has never experienced this pain before.  It has  been ongoing for up to 12 hours.   No fevers/chest pain/shortness of breath She does report vomiting Past Medical History:  Diagnosis Date   ALLERGIC RHINITIS    Allergy    Anemia    ANXIETY    Arthritis    hands   Bronchitis    Chronic fatigue fibromyalgia syndrome    Complication of anesthesia    says one time waking up she couldn't breathe, felt like her throat closing up   DEPRESSION    Enthesopathy of hip region    Family history of anesthesia complication    sister with n/v   FOOT PAIN, BILATERAL    GERD    Hiatal hernia    HYPERLIPIDEMIA    Hypothyroidism    IBS    MIGRAINE HEADACHE    MIGRAINE, COMMON    NECK MASS    OSTEOPENIA    OSTEOPOROSIS    PLANTAR FASCIITIS    RASH-NONVESICULAR    SINUSITIS- ACUTE-NOS    SKIN LESION    Urticaria    VAGINITIS    VENOUS INSUFFICIENCY, CHRONIC     Patient Active Problem List   Diagnosis Date Noted   SBO (small bowel obstruction) ([place]) 01/11/2021   Rash and other nonspecific skin eruption 12/24/2020   Adverse effect of other drugs, medicaments and  biological substances, subsequent encounter 12/24/2020   Other allergic rhinitis 12/24/2020   Skin tear of right upper extremity 10/22/2020   Cellulitis 10/22/2020   Allergic reaction 10/18/2020   Aortic atherosclerosis ([place]) 03/29/2020   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/11/2019   Scoliosis of thoracolumbar spine 07/11/2019   Choking 03/24/2019   Hypokalemia 03/24/2019   Fatigue 12/31/2018   Epistaxis, recurrent 08/08/2018   Degenerative arthritis of knee, bilateral 07/05/2018   Protrusion of lumbar intervertebral disc 04/20/2018   Difficulty urinating 11/24/2017   Dizziness 08/11/2017   Sacroiliac pain 06/17/2017   Greater trochanteric bursitis of right hip 05/27/2017   Vertigo 04/29/2017   Dysphagia 10/28/2016   Lump of skin 05/11/2016   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   Chronic meniscal tear of knee 03/05/2016   Nausea 03/04/2016   Osteopenia 12/03/2015   Thyroid nodule 11/16/2015   Bilateral leg edema 11/05/2015   Anterior neck pain 11/05/2015   Hormone replacement therapy (HRT) 11/05/2015   Subacromial bursitis 09/16/2015   Spondylosis 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 myelopathy or radiculopathy 03/12/2015   Trochanteric bursitis of both hips 03/12/2015   Subacromial impingement of left shoulder 03/12/2015   Hyperlipidemia 05/01/2014   Chronic pain syndrome 05/01/2014   Lumbar radiculopathy  03/27/2014   Left shoulder pain 11/07/2013   Degeneration of intervertebral disc of cervical region 10/10/2013   Fibromyalgia 09/06/2013   Bilateral shoulder pain 09/06/2013   Chronic cough 04/12/2013   Sinusitis, chronic 04/12/2013   Hypothyroidism 04/07/2012   Chronic venous insufficiency 04/02/2010   Enthesopathy of hip region 07/19/2007   Osteoporosis 07/19/2007   Anxiety 01/23/2007   Depression 01/23/2007   Migraine 01/23/2007   NINAR (noninfectious nonallergic rhinitis) 01/23/2007   Gastroesophageal reflux disease 01/23/2007   IBS 01/23/2007    Past  Surgical History:  Procedure Laterality Date   APPENDECTOMY     BREAST ENHANCEMENT SURGERY     BREAST IMPLANT REMOVAL     CHOLECYSTECTOMY     COLONOSCOPY     FOOT SURGERY  11/06/2016   MASTECTOMY     bilateral for severe bilateral fibrocystic disease   OOPHORECTOMY     s/p neck lump removal     SHOULDER SURGERY     SINUS ENDO W/FUSION Right 06/30/2013   Procedure: RIGHT ENDOSCOPIC SPHENOIDECTOMY WITH FUSION SCAN;  Surgeon: [name], MD;  Location: [place];  Service: ENT;  Laterality: Right;   TONSILLECTOMY     VAGINAL HYSTERECTOMY       OB History   No obstetric history on file.     Family History  Problem Relation Age of Onset   Diabetes Mother    Heart disease Father    Diabetes Father    Diabetes Sister    Heart disease Maternal Aunt    Heart disease Maternal Uncle        x 2   Kidney disease Paternal Uncle        questionable   Asthma Maternal Grandmother    Breast cancer Maternal Grandmother    Heart disease Son    Healthy Son    Irritable bowel syndrome Son    Colon cancer Neg Hx    Colon polyps Neg Hx    Rectal cancer Neg Hx    Stomach cancer Neg Hx     Social History   Tobacco Use   Smoking status: Never   Smokeless tobacco: Never  Vaping Use   Vaping Use: Never used  Substance Use Topics   Alcohol use: No   Drug use: No    Home Medications Prior to Admission medications   Medication Sig Start Date End Date Taking? Authorizing Provider  acetaminophen (TYLENOL) 500 MG tablet Take 1,000 mg by mouth every 6 (six) hours as needed for mild pain, moderate pain, fever or headache.    [provider]  ALPRAZolam (XANAX) 0.5 MG tablet TAKE 1 TABLET(0.5 MG) BY MOUTH THREE TIMES DAILY AS NEEDED FOR ANXIETY 01/02/21   Burns, Claudina Lick, MD  Amino Acids (AMINO ACID PO) Take 1 capsule by mouth 2 (two) times daily.    [provider]  Ascorbic Acid (VITAMIN C) 1000 MG tablet Take 1,000 mg by mouth 2 (two) times daily.    [provider]  B Complex-Biotin-FA (B COMPLETE) TABS Take 1 tablet by mouth daily. B Complete '100mg'$     [provider]  benzonatate (TESSALON) 200 MG capsule TAKE 1 CAPSULE(200 MG) BY MOUTH THREE TIMES DAILY AS NEEDED 12/04/20   Binnie Rail, MD  buPROPion (WELLBUTRIN XL) 300 MG 24 hr tablet TAKE 1 TABLET(300 MG) BY MOUTH DAILY 10/09/20   Binnie Rail, MD  Cholecalciferol (VITAMIN D3) 1000 units CAPS Take 1,000 Units by mouth daily.    [provider]  Coenzyme Q10 (CO Q-10) 200 MG CAPS Take 1 capsule by mouth 2 (two) times daily.    [provider]  ECHINACEA PO Take 1 tablet by mouth 2 (two) times daily.    [provider]  ELDERBERRY PO Take by mouth daily.    [provider]  estradiol (ESTRACE) 0.5 MG tablet TAKE 1 TABLET(0.5 MG) BY MOUTH DAILY 04/08/20   Burns, Claudina Lick, MD  fluticasone (FLONASE) 50 MCG/ACT nasal spray Place 2 sprays into both nostrils as needed.  07/11/19   [provider]  Eloise Levels, Camellia sinensis, POWD Take 1 capsule by mouth daily.    [provider]  Homeopathic Products (ARNICA [place]) PLLT Take by mouth daily.    [provider]  HYDROmorphone (DILAUDID) 2 MG tablet Take 2 mg by mouth 5 (five) times daily as needed. 11/27/20   [provider]  hydrOXYzine (VISTARIL) 25 MG capsule Take 1 capsule (25 mg total) by mouth every 8 (eight) hours as needed for itching. 10/18/20   Burns, Claudina Lick, MD  KRILL OIL PO Take by mouth daily.    [provider]  LECITHIN PO Take 1 tablet by mouth daily.    [provider]  levothyroxine (SYNTHROID) 75 MCG tablet TAKE 1 TABLET BY MOUTH EVERY MORNING 12/04/20   Burns, Claudina Lick, MD  MAGNESIUM ASPARTATE PO Take 1 tablet by mouth 2 (two) times daily.    [provider]  meclizine (ANTIVERT) 25 MG tablet Take 1 tablet (25 mg total) by mouth 3 (three) times daily as needed for dizziness. 02/20/19   Binnie Rail, MD  Misc. Devices (ROLLATOR) MISC Use  rollator for ambulation 12/20/17   Lyndal Pulley, DO  Multiple Vitamins-Minerals (ZINC PO) Take by mouth daily.    [provider]  naloxone [facility]) nasal spray 4 mg/0.1 mL SMARTSIG:Both Nares 09/24/20   [provider]  ondansetron (ZOFRAN) 4 MG tablet TAKE 1 TABLET(4 MG) BY MOUTH EVERY 8 HOURS AS NEEDED FOR NAUSEA OR VOMITING 11/17/19   Burns, Claudina Lick, MD  ondansetron (ZOFRAN-ODT) 8 MG disintegrating tablet DISSOLVE 1 TABLET(8 MG) ON THE TONGUE EVERY 8 HOURS AS NEEDED FOR NAUSEA OR VOMITING 12/20/19   Binnie Rail, MD  POTASSIUM AMINOBENZOATE PO Take 1 tablet by mouth daily.    [provider]  triamterene-hydrochlorothiazide (MAXZIDE-25) 37.5-25 MG tablet TAKE 1 TABLET BY MOUTH EVERY DAY 12/04/20   Binnie Rail, MD  TURMERIC PO Take 1 tablet by mouth 2 (two) times daily.     [provider]  vitamin E 400 UNIT capsule Take 400 Units by mouth daily.    [provider]    Allergies    Prednisone, Amoxicillin-pot clavulanate, Aspirin, Erythromycin, Levofloxacin, Nsaids, Statins, Sumatriptan, Adhesive [tape], Ivp dye [iodinated diagnostic agents], Lyrica [pregabalin], Methylphenidate hcl, Mirtazapine, Oxycodone, Red yeast rice [cholestin], Shellfish allergy, Soy allergy, Doxycycline, Hydrocodone, Hydrocodone-acetaminophen, Latex, Rofecoxib, Sertraline hcl, and Sulfonamide derivatives  Review of Systems   Review of Systems  Constitutional:  Negative for fever.  Respiratory:  Negative for shortness of breath.   Cardiovascular:  Negative for chest pain.  Gastrointestinal:  Positive for abdominal pain, constipation, nausea and vomiting. Negative for diarrhea.  Genitourinary:  Negative for dysuria.  All other systems reviewed and are negative.  Physical Exam Updated Vital Signs BP (!) 190/93   Pulse 86   Temp (!) 97.5 F (36.4 C) (Oral)   Resp (!) 24   SpO2 95%   Physical Exam CONSTITUTIONAL: Elderly,  uncomfortable appearing HEAD:  Normocephalic/atraumatic EYES: EOMI/PERRL ENMT: Mucous membranes moist NECK: supple no meningeal signs SPINE/BACK:entire spine nontender CV: S1/S2 noted, no murmurs/rubs/gallops noted LUNGS: Lungs are clear to auscultation bilaterally, no apparent distress ABDOMEN: soft, moderate tenderness to left upper and left lower quadrant, nondistended, no rebound or guarding, bowel sounds noted throughout abdomen GU:no cva tenderness NEURO: Pt is awake/alert/appropriate, moves all extremitiesx4.  No facial droop.   EXTREMITIES: pulses normal/equal, full ROM SKIN: warm, color normal PSYCH: Anxious  ED Results / Procedures / Treatments   Labs (all labs ordered are listed, but only abnormal results are displayed) Labs Reviewed  COMPREHENSIVE METABOLIC PANEL - Abnormal; Notable for the following components:      Result Value   Sodium 128 (*)    Potassium 3.1 (*)    Chloride 89 (*)    Glucose, Bld 182 (*)    Anion gap 16 (*)    All other components within normal limits  CBC - Abnormal; Notable for the following components:   WBC 16.7 (*)    MCH 34.3 (*)    All other components within normal limits  RESP PANEL BY RT-PCR (FLU A&B, COVID) ARPGX2  LIPASE, BLOOD    EKG EKG Interpretation  Date/Time:  Saturday January 11 2021 04:24:31 EDT Ventricular Rate:  87 PR Interval:  180 QRS Duration: 76 QT Interval:  398 QTC Calculation: 479 R Axis:   80 Text Interpretation: Sinus rhythm Consider right atrial enlargement Nonspecific T abnrm, anterolateral leads Interpretation limited secondary to artifact Confirmed by Ripley Fraise [phone]) on 01/11/2021 4:30:39 AM  Radiology CT ABDOMEN PELVIS WO CONTRAST  Result Date: 01/11/2021 CLINICAL DATA:  Generalized abdominal pain with nausea and vomiting. EXAM: CT ABDOMEN AND PELVIS WITHOUT CONTRAST TECHNIQUE: Multidetector CT imaging of the abdomen and pelvis was performed following the standard protocol without IV contrast. COMPARISON:  November 15, 2015  FINDINGS: Lower chest: Breast implants are noted. Hepatobiliary: No focal liver abnormality is seen. Status post cholecystectomy. Mild central intrahepatic biliary dilatation is seen. Pancreas: Unremarkable. No pancreatic ductal dilatation or surrounding inflammatory changes. Spleen: Normal in size without focal abnormality. Adrenals/Urinary Tract: Adrenal glands are unremarkable. Kidneys are normal, without renal calculi, focal lesion, or hydronephrosis. The urinary bladder is distended and otherwise unremarkable. Stomach/Bowel: There is a small hiatal hernia. The appendix is surgically absent. A large amount of stool is seen throughout the colon. Multiple small bowel loops within the mid and upper abdomen are predominant fluid-filled and post the upper limit of normal caliber. Dilated small bowel loops are seen within the posterior aspect of the pelvis on the left (maximum small bowel diameter of approximately 3.1 cm). These involve loops of distal jejunum that extend along the region posterior to the urinary bladder on the left. A transition zone is poorly visualized but is located within the medial aspect of the left lower quadrant (axial CT images 48 through 52, CT series 2/sagittal reformatted images 103 through 114, CT series 6). Vascular/Lymphatic: Aortic atherosclerosis. No enlarged abdominal or pelvic lymph nodes. Reproductive: Status post hysterectomy. No adnexal masses. Other: No abdominal wall hernia or abnormality. No abdominopelvic ascites or free air. Musculoskeletal: Marked severity multilevel degenerative changes are seen throughout the lumbar spine. IMPRESSION: 1. Small bowel obstruction originating within the distal jejunum, with a transition zone noted within the left lower quadrant. 2. Small hiatal hernia. 3. Evidence of prior cholecystectomy and prior hysterectomy. Electronically Signed   By: Virgina Norfolk M.D.   On: 01/11/2021 05:32    Procedures  Procedures   Medications Ordered in  ED Medications  HYDROmorphone (DILAUDID) injection 1 mg (1 mg Intravenous Given 01/11/21 0603)  ondansetron (ZOFRAN) injection 4 mg (has no administration in time range)  fentaNYL (SUBLIMAZE) injection 100 mcg (100 mcg Intravenous Given 01/11/21 0403)  ondansetron (ZOFRAN) injection 4 mg (4 mg Intravenous Given 01/11/21 0404)  HYDROmorphone (DILAUDID) injection 1 mg (1 mg Intravenous Given 01/11/21 0419)  sodium chloride 0.9 % bolus 1,000 mL (0 mLs Intravenous Stopped 01/11/21 0515)  HYDROmorphone (DILAUDID) injection 1 mg (1 mg Intravenous Given 01/11/21 0444)  HYDROmorphone (DILAUDID) injection 1 mg (1 mg Intravenous Given 01/11/21 0531)  lactated ringers bolus 1,000 mL (1,000 mLs Intravenous New Bag/Given 01/11/21 0604)    ED Course  I have reviewed the triage vital signs and the nursing notes.  Pertinent labs & imaging results that were available during my care of the patient were reviewed by me and considered in my medical decision making (see chart for details).    MDM Rules/Calculators/A&P                           Patient presented with rapidly worsening abdominal pain & vomiting and reports recent constipation.  Patient appears very uncomfortable and it is very difficult to control her pain.  CT imaging was personally reviewed and it confirmed a small bowel obstruction without perforation.  I discussed radiology findings with the radiology specialist.  Discussed with Dr. Georgette Dover with general surgery, who will see patient in consultation.  Patient be admitted to the hospital service discussed with Dr. Myna Hidalgo.  We will attempt NG tube placement.  Final Clinical Impression(s) / ED Diagnoses Final diagnoses:  Small bowel obstruction [facility])    Rx / DC Orders ED Discharge Orders     None        Ripley Fraise, MD 01/11/21 0630

## 2021-01-11 NOTE — Progress Notes (Signed)
[facility] rm [id number],Laura Barnes 80yoF,adm SBO. has NGT in.,NPO. Lots of Allergies. Wants med indigestion. Had protonix today already.

## 2021-01-12 ENCOUNTER — Inpatient Hospital Stay (HOSPITAL_COMMUNITY): Payer: Medicare Other

## 2021-01-12 DIAGNOSIS — Z0189 Encounter for other specified special examinations: Secondary | ICD-10-CM | POA: Diagnosis not present

## 2021-01-12 DIAGNOSIS — R110 Nausea: Secondary | ICD-10-CM

## 2021-01-12 DIAGNOSIS — K56609 Unspecified intestinal obstruction, unspecified as to partial versus complete obstruction: Secondary | ICD-10-CM | POA: Diagnosis not present

## 2021-01-12 LAB — COMPREHENSIVE METABOLIC PANEL
ALT: 17 U/L (ref 0–44)
AST: 22 U/L (ref 15–41)
Albumin: 3.2 g/dL — ABNORMAL LOW (ref 3.5–5.0)
Alkaline Phosphatase: 33 U/L — ABNORMAL LOW (ref 38–126)
Anion gap: 6 (ref 5–15)
BUN: 8 mg/dL (ref 8–23)
CO2: 26 mmol/L (ref 22–32)
Calcium: 8.3 mg/dL — ABNORMAL LOW (ref 8.9–10.3)
Chloride: 95 mmol/L — ABNORMAL LOW (ref 98–111)
Creatinine, Ser: 0.46 mg/dL (ref 0.44–1.00)
GFR, Estimated: >60 mL/min (ref >60)
Glucose, Bld: 178 mg/dL — ABNORMAL HIGH (ref 70–99)
Potassium: 4.8 mmol/L (ref 3.5–5.1)
Sodium: 127 mmol/L — ABNORMAL LOW (ref 135–145)
Total Bilirubin: 0.9 mg/dL (ref 0.3–1.2)
Total Protein: 6.3 g/dL — ABNORMAL LOW (ref 6.5–8.1)

## 2021-01-12 LAB — CBC
HCT: 42.2 % (ref 36.0–46.0)
Hemoglobin: 14.9 g/dL (ref 12.0–15.0)
MCH: 34.4 pg — ABNORMAL HIGH (ref 26.0–34.0)
MCHC: 35.3 g/dL (ref 30.0–36.0)
MCV: 97.5 fL (ref 80.0–100.0)
Platelets: 296 10*3/uL (ref 150–400)
RBC: 4.33 MIL/uL (ref 3.87–5.11)
RDW: 12.8 % (ref 11.5–15.5)
WBC: 21 10*3/uL — ABNORMAL HIGH (ref 4.0–10.5)
nRBC: 0 % (ref 0.0–0.2)

## 2021-01-12 LAB — GLUCOSE, CAPILLARY
Glucose-Capillary: 128 mg/dL — ABNORMAL HIGH (ref 70–99)
Glucose-Capillary: 136 mg/dL — ABNORMAL HIGH (ref 70–99)
Glucose-Capillary: 154 mg/dL — ABNORMAL HIGH (ref 70–99)
Glucose-Capillary: 170 mg/dL — ABNORMAL HIGH (ref 70–99)

## 2021-01-12 MED ORDER — MAGNESIUM SULFATE 2 GM/50ML IV SOLN
2.0000 g | Freq: Once | INTRAVENOUS | Status: AC
  Administered 2021-01-12: 2 g via INTRAVENOUS
  Filled 2021-01-12: qty 50

## 2021-01-12 MED ORDER — LEVOTHYROXINE SODIUM 100 MCG/5ML IV SOLN
25.0000 ug | Freq: Every day | INTRAVENOUS | Status: DC
Start: 2021-01-15 — End: 2021-01-19
  Administered 2021-01-15 – 2021-01-19 (×5): 25 ug via INTRAVENOUS
  Filled 2021-01-12 (×5): qty 5

## 2021-01-12 MED ORDER — PHENOL 1.4 % MT LIQD
1.0000 | OROMUCOSAL | Status: DC | PRN
  Filled 2021-01-12: qty 177

## 2021-01-12 MED ORDER — CHLORHEXIDINE GLUCONATE CLOTH 2 % EX PADS
6.0000 | MEDICATED_PAD | Freq: Every day | CUTANEOUS | Status: DC
  Administered 2021-01-12 – 2021-01-21 (×8): 6 via TOPICAL

## 2021-01-12 NOTE — Progress Notes (Addendum)
PROGRESS NOTE    Laura Barnes  [id number] DOB: 1940-12-23 DOA: 01/11/2021 PCP: Binnie Rail, MD  Brief Narrative: HPI per Dr. Marcello Moores on 01/11/2021 Laura Barnes is a 80 y.o. female with medical history significant of multiple drug intolerances/allergies followed by allergy,NSAId induced GI bleeding, Anemia, Anxiety ,fibromyalgia ,depression,gerd, hypothyrodism, HLD, Migraines, djd of the spine , Grade 1 diastolic dysfunction, IBS,chronic pain syndrome who presents to ed with generalized abdominal pain and n/v hours PTA. Patient notes pain started after dinner and is located on the left side of her abdomen. She describes pain as aching and associated with n/v/ and constipation. She notes no fever/chills/chest pain / sob / cough or diarrhea/dark stools blood in stools, dysuria or difficulty with her urine.  She note not similar episodes in the past. On evaluation in ed patient was found to have small bowel obstruction.  Surgery Dr Georgette Dover was consulted who recommended admit to hospitalist service with surgery consult.  Patient currently s/p NG tube and pain medications notes pain is tolerable. She does some difficulty with urine but states she has had these issue in the past  Assessment & Plan:   Active Problems:   SBO (small bowel obstruction) (HCC)   Small bowel obstruction (HCC)    #1 small bowel obstruction patient continues with NG tube in place with significant drainage.  CT of the abdomen shows small bowel obstruction originating within the distal jejunum with transition zone noted within the left lower quadrant prior cholecystectomy hysterectomy and small hiatal hernia. KUB 01/12/2021 pending  Encourage patient to ambulate out of bed as much as possible. Appreciate surgery input. Leukocytosis has worsened some likely reactive no signs of active infection noted so far.  UA does not appear to have UTI.  #2 hypokalemia and hyponatremia-replete potassium and continue normal  saline recheck labs in AM.  Mag was 1.6 yesterday will replete and recheck labs.  #3 history of hypertension on as needed hydralazine takes Maxide at home.  Blood pressure 176/86  #4 hypothyroidism on Synthroid TSH 0.996.  #5 diastolic CHF stable  #6 anxiety and depression Home meds on hold as she has an NG tube  #7 GERD continue PPI  #8 fibromyalgia continue supportive treatment  #9 anemia chronic follow closely  Estimated body mass index is 25.23 kg/m as calculated from the following:   Height as of this encounter: 4' 11.25" (1.505 m).   Weight as of this encounter: 57.2 kg.  DVT prophylaxis: lovenox Code Status: full Family Communication: none at bed side  Disposition Plan:  Status is: Inpatient  Remains inpatient appropriate because:Persistent severe electrolyte disturbances  Dispo: The patient is from: Home              Anticipated d/c is to: Home              Patient currently is not medically stable to d/c.   Difficult to place patient No       Consultants: surgery  Procedures: ngt Antimicrobials none   Subjective: Patient is resting in bed she is very uncomfortable with NG tube in place reports her throat is hurting however she is aware that she needs to have the tube in place now.  Encouraged her to walk which she wants to do today she is very motivated to get better and go home she has not had a flatus or a bowel movement Review of the chart with chest x-ray reading NG tube close to the mainstem bronchus however her NG  tube has been functioning well overnight  Objective: Vitals:   01/11/21 1359 01/11/21 1748 01/11/21 2028 01/12/21 0631  BP: (!) 155/84 (!) 154/72 (!) 152/86 (!) 176/86  Pulse: 96 (!) 101 98 (!) 106  Resp: '18 17 16 18  '$ Temp: 97.9 F (36.6 C) 98.2 F (36.8 C) 97.8 F (36.6 C) 98.7 F (37.1 C)  TempSrc: Oral  Oral Oral  SpO2: 99% 98% 100% (!) 83%  Weight:      Height:        Intake/Output Summary (Last 24 hours) at 01/12/2021  0934 Last data filed at 01/12/2021 0600 Gross per 24 hour  Intake 1692.43 ml  Output 3600 ml  Net -1907.57 ml   Filed Weights   01/11/21 1132  Weight: 57.2 kg    Examination:  General exam: Appears calm and comfortable  Respiratory system: Clear to auscultation. Respiratory effort normal. Cardiovascular system: S1 & S2 heard, RRR. No JVD, murmurs, rubs, gallops or clicks. No pedal edema. Gastrointestinal system: Abdomen is nondistended, soft and tender. No organomegaly or masses felt. no bowel sounds heard. Central nervous system: Alert and oriented. No focal neurological deficits. Extremities: Symmetric 5 x 5 power. Skin: No rashes, lesions or ulcers Psychiatry: Judgement and insight appear normal. Mood & affect appropriate.     Data Reviewed: I have personally reviewed following labs and imaging studies  CBC: Recent Labs  Lab 01/11/21 0321 01/12/21 0438  WBC 16.7* 21.0*  HGB 14.5 14.9  HCT 40.6 42.2  MCV 96.0 97.5  PLT 289 0000000   Basic Metabolic Panel: Recent Labs  Lab 01/11/21 0321 01/11/21 1340 01/12/21 0438  NA 128* 125* 127*  K 3.1* 4.2 4.8  CL 89* 90* 95*  CO2 '23 25 26  '$ GLUCOSE 182* 138* 178*  BUN 8 7* 8  CREATININE 0.47 0.44 0.46  CALCIUM 10.0 8.4* 8.3*  MG  --  1.6*  --   PHOS  --  2.6  --    GFR: Estimated Creatinine Clearance: 43.6 mL/min (by C-G formula based on SCr of 0.46 mg/dL). Liver Function Tests: Recent Labs  Lab 01/11/21 0321 01/12/21 0438  AST 29 22  ALT 22 17  ALKPHOS 40 33*  BILITOT 0.8 0.9  PROT 8.0 6.3*  ALBUMIN 4.8 3.2*   Recent Labs  Lab 01/11/21 0321  LIPASE 21   No results for input(s): AMMONIA in the last 168 hours. Coagulation Profile: No results for input(s): INR, PROTIME in the last 168 hours. Cardiac Enzymes: No results for input(s): CKTOTAL, CKMB, CKMBINDEX, TROPONINI in the last 168 hours. BNP (last 3 results) No results for input(s): PROBNP in the last 8760 hours. HbA1C: Recent Labs    01/11/21 0321   HGBA1C 5.3   CBG: Recent Labs  Lab 01/11/21 1241 01/11/21 1757 01/11/21 2354 01/12/21 0627  GLUCAP 157* 131* 139* 170*   Lipid Profile: No results for input(s): CHOL, HDL, LDLCALC, TRIG, CHOLHDL, LDLDIRECT in the last 72 hours. Thyroid Function Tests: Recent Labs    01/11/21 1340  TSH 0.996   Anemia Panel: No results for input(s): VITAMINB12, FOLATE, FERRITIN, TIBC, IRON, RETICCTPCT in the last 72 hours. Sepsis Labs: Recent Labs  Lab 01/11/21 1340  LATICACIDVEN 1.3    Recent Results (from the past 240 hour(s))  Resp Panel by RT-PCR (Flu A&B, Covid) Nasopharyngeal Swab     Status: None   Collection Time: 01/11/21  5:47 AM   Specimen: Nasopharyngeal Swab; Nasopharyngeal(NP) swabs in vial transport medium  Result Value Ref Range Status  SARS Coronavirus 2 by RT PCR NEGATIVE NEGATIVE Final    Comment: (NOTE) SARS-CoV-2 target nucleic acids are NOT DETECTED.  The SARS-CoV-2 RNA is generally detectable in upper respiratory specimens during the acute phase of infection. The lowest concentration of SARS-CoV-2 viral copies this assay can detect is 138 copies/mL. A negative result does not preclude SARS-Cov-2 infection and should not be used as the sole basis for treatment or other patient management decisions. A negative result may occur with  improper specimen collection/handling, submission of specimen other than nasopharyngeal swab, presence of viral mutation(s) within the areas targeted by this assay, and inadequate number of viral copies(<138 copies/mL). A negative result must be combined with clinical observations, patient history, and epidemiological information. The expected result is Negative.  Fact Sheet for Patients:  EntrepreneurPulse.com.au  Fact Sheet for Healthcare Providers:  IncredibleEmployment.be  This test is no t yet approved or cleared by the [place] FDA and  has been authorized for detection and/or  diagnosis of SARS-CoV-2 by FDA under an Emergency Use Authorization (EUA). This EUA will remain  in effect (meaning this test can be used) for the duration of the COVID-19 declaration under Section 564(b)(1) of the Act, 21 U.S.C.section 360bbb-3(b)(1), unless the authorization is terminated  or revoked sooner.       Influenza A by PCR NEGATIVE NEGATIVE Final   Influenza B by PCR NEGATIVE NEGATIVE Final    Comment: (NOTE) The Xpert Xpress SARS-CoV-2/FLU/RSV plus assay is intended as an aid in the diagnosis of influenza from Nasopharyngeal swab specimens and should not be used as a sole basis for treatment. Nasal washings and aspirates are unacceptable for Xpert Xpress SARS-CoV-2/FLU/RSV testing.  Fact Sheet for Patients: EntrepreneurPulse.com.au  Fact Sheet for Healthcare Providers: IncredibleEmployment.be  This test is not yet approved or cleared by the [place] FDA and has been authorized for detection and/or diagnosis of SARS-CoV-2 by FDA under an Emergency Use Authorization (EUA). This EUA will remain in effect (meaning this test can be used) for the duration of the COVID-19 declaration under Section 564(b)(1) of the Act, 21 U.S.C. section 360bbb-3(b)(1), unless the authorization is terminated or revoked.  Performed at [facility], [place] [address] [place], [place] [address]          Radiology Studies: CT ABDOMEN PELVIS WO CONTRAST  Result Date: 01/11/2021 CLINICAL DATA:  Generalized abdominal pain with nausea and vomiting. EXAM: CT ABDOMEN AND PELVIS WITHOUT CONTRAST TECHNIQUE: Multidetector CT imaging of the abdomen and pelvis was performed following the standard protocol without IV contrast. COMPARISON:  November 15, 2015 FINDINGS: Lower chest: Breast implants are noted. Hepatobiliary: No focal liver abnormality is seen. Status post cholecystectomy. Mild central intrahepatic biliary dilatation is seen. Pancreas:  Unremarkable. No pancreatic ductal dilatation or surrounding inflammatory changes. Spleen: Normal in size without focal abnormality. Adrenals/Urinary Tract: Adrenal glands are unremarkable. Kidneys are normal, without renal calculi, focal lesion, or hydronephrosis. The urinary bladder is distended and otherwise unremarkable. Stomach/Bowel: There is a small hiatal hernia. The appendix is surgically absent. A large amount of stool is seen throughout the colon. Multiple small bowel loops within the mid and upper abdomen are predominant fluid-filled and post the upper limit of normal caliber. Dilated small bowel loops are seen within the posterior aspect of the pelvis on the left (maximum small bowel diameter of approximately 3.1 cm). These involve loops of distal jejunum that extend along the region posterior to the urinary bladder on the left. A transition zone is poorly visualized  but is located within the medial aspect of the left lower quadrant (axial CT images 48 through 52, CT series 2/sagittal reformatted images 103 through 114, CT series 6). Vascular/Lymphatic: Aortic atherosclerosis. No enlarged abdominal or pelvic lymph nodes. Reproductive: Status post hysterectomy. No adnexal masses. Other: No abdominal wall hernia or abnormality. No abdominopelvic ascites or free air. Musculoskeletal: Marked severity multilevel degenerative changes are seen throughout the lumbar spine. IMPRESSION: 1. Small bowel obstruction originating within the distal jejunum, with a transition zone noted within the left lower quadrant. 2. Small hiatal hernia. 3. Evidence of prior cholecystectomy and prior hysterectomy. Electronically Signed   By: Virgina Norfolk M.D.   On: 01/11/2021 05:32   Portable chest 1 View  Result Date: 01/11/2021 CLINICAL DATA:  Per orders: nausea and NG tube placement. EXAM: PORTABLE CHEST - 1 VIEW COMPARISON:  09/19/2014 FINDINGS: Lungs are clear. Nasogastric tube overlies the proximal esophagus with the  tip overlying the proximal left mainstem bronchus. Heart size and mediastinal contours are within normal limits. No effusion.  No pneumothorax Visualized bones unremarkable.  Surgical clips right upper abdomen. IMPRESSION: 1. Nasogastric tube tip overlies proximal left mainstem bronchus. Electronically Signed   By: Lucrezia Europe M.D.   On: 01/11/2021 09:04   DG Abd Portable 1V-Small Bowel Obstruction Protocol-initial, 8 hr delay  Result Date: 01/12/2021 CLINICAL DATA:  Small bowel follow at 8 hours, initial encounter EXAM: PORTABLE ABDOMEN - 1 VIEW COMPARISON:  Film from earlier in the same day. FINDINGS: Gastric catheter is noted barely within the stomach. Proximal side port lies within the distal esophagus. A small amount of contrast is noted within the stomach. No significant contrast is noted within the large or small bowel. No obstructive changes are seen however. Degenerative changes of lumbar spine are noted. IMPRESSION: No significant contrast material is noted within the large or small bowel. Electronically Signed   By: Inez Catalina M.D.   On: 01/12/2021 00:31   DG Abd Portable 1V  Result Date: 01/11/2021 CLINICAL DATA:  79 year old female status post advanced NG tube. EXAM: PORTABLE ABDOMEN - 1 VIEW COMPARISON:  0818 hours today. FINDINGS: Portable AP supine view at 0952 hours. Enteric tube has been advanced. Tip is now at the level of the gastroesophageal junction. Side hole is at the distal thoracic esophagus. Negative visible lung bases. Stable visible abdomen. Lumbar levoconvex scoliosis and degeneration. IMPRESSION: Enteric tube advanced with tip now at the level of the GEJ. Advanced an additional 12-14 cm to ensure side hole placement within the stomach. Electronically Signed   By: Genevie Ann M.D.   On: 01/11/2021 10:32   DG Abd Portable 1V-Small Bowel Protocol-Position Verification  Result Date: 01/11/2021 CLINICAL DATA:  Per orders: nausea and NG tube placement. EXAM: PORTABLE ABDOMEN - 1  VIEW COMPARISON:  CT from earlier the same day FINDINGS: Nasogastric tube tip overlies the mid esophagus just inferior to the left mainstem bronchus. Bowel gas pattern normal. Lumbar levoscoliosis with multilevel spondylitic change. IMPRESSION: Nasogastric tube tip in the midesophagus. Electronically Signed   By: Lucrezia Europe M.D.   On: 01/11/2021 09:05        Scheduled Meds:  Chlorhexidine Gluconate Cloth  6 each Topical Daily   enoxaparin (LOVENOX) injection  40 mg Subcutaneous Q24H   pantoprazole (PROTONIX) IV  40 mg Intravenous Q24H   Continuous Infusions:  dextrose 5 % and 0.9 % NaCl with KCl 40 mEq/L 150 mL/hr at 01/12/21 0630   promethazine (PHENERGAN) injection (IM or IVPB) 12.5 mg (  01/11/21 1400)     LOS: 1 day    Time spent: 40 min  Georgette Shell, MD 01/12/2021, 9:34 AM

## 2021-01-12 NOTE — Progress Notes (Signed)
Subjective/Chief Complaint: Complaining of NG tube causing a feeling of nausea due to throat irritation No BM or flatus Plain films at 8 hours - unable to visualize much contrast in SB or colon; gas pattern does not look obstructive   Objective: Vital signs in last 24 hours: Temp:  [97.7 F (36.5 C)-98.7 F (37.1 C)] 98.7 F (37.1 C) (08/14 0631) Pulse Rate:  [96-106] 106 (08/14 0631) Resp:  [16-18] 18 (08/14 0631) BP: (152-176)/(72-86) 176/86 (08/14 0631) SpO2:  [83 %-100 %] 83 % (08/14 0631) Weight:  [57.2 kg] 57.2 kg (08/13 1132) Last BM Date: 01/10/21  Intake/Output from previous day: 08/13 0701 - 08/14 0700 In: 1692.4 [I.V.:537; NG/GT:800; IV Piggyback:355.4] Out: 3600 [Urine:2700; Emesis/NG output:900] Intake/Output this shift: No intake/output data recorded.  General appearance: alert, cooperative, and no distress GI: soft, much less distended; minimal tenderness to palpation NG functioning - bilious output in cannister  Lab Results:  Recent Labs    01/11/21 0321 01/12/21 0438  WBC 16.7* 21.0*  HGB 14.5 14.9  HCT 40.6 42.2  PLT 289 296   BMET Recent Labs    01/11/21 1340 01/12/21 0438  NA 125* 127*  K 4.2 4.8  CL 90* 95*  CO2 25 26  GLUCOSE 138* 178*  BUN 7* 8  CREATININE 0.44 0.46  CALCIUM 8.4* 8.3*   PT/INR No results for input(s): LABPROT, INR in the last 72 hours. ABG No results for input(s): PHART, HCO3 in the last 72 hours.  Invalid input(s): PCO2, PO2  Studies/Results: CT ABDOMEN PELVIS WO CONTRAST  Result Date: 01/11/2021 CLINICAL DATA:  Generalized abdominal pain with nausea and vomiting. EXAM: CT ABDOMEN AND PELVIS WITHOUT CONTRAST TECHNIQUE: Multidetector CT imaging of the abdomen and pelvis was performed following the standard protocol without IV contrast. COMPARISON:  November 15, 2015 FINDINGS: Lower chest: Breast implants are noted. Hepatobiliary: No focal liver abnormality is seen. Status post cholecystectomy. Mild central  intrahepatic biliary dilatation is seen. Pancreas: Unremarkable. No pancreatic ductal dilatation or surrounding inflammatory changes. Spleen: Normal in size without focal abnormality. Adrenals/Urinary Tract: Adrenal glands are unremarkable. Kidneys are normal, without renal calculi, focal lesion, or hydronephrosis. The urinary bladder is distended and otherwise unremarkable. Stomach/Bowel: There is a small hiatal hernia. The appendix is surgically absent. A large amount of stool is seen throughout the colon. Multiple small bowel loops within the mid and upper abdomen are predominant fluid-filled and post the upper limit of normal caliber. Dilated small bowel loops are seen within the posterior aspect of the pelvis on the left (maximum small bowel diameter of approximately 3.1 cm). These involve loops of distal jejunum that extend along the region posterior to the urinary bladder on the left. A transition zone is poorly visualized but is located within the medial aspect of the left lower quadrant (axial CT images 48 through 52, CT series 2/sagittal reformatted images 103 through 114, CT series 6). Vascular/Lymphatic: Aortic atherosclerosis. No enlarged abdominal or pelvic lymph nodes. Reproductive: Status post hysterectomy. No adnexal masses. Other: No abdominal wall hernia or abnormality. No abdominopelvic ascites or free air. Musculoskeletal: Marked severity multilevel degenerative changes are seen throughout the lumbar spine. IMPRESSION: 1. Small bowel obstruction originating within the distal jejunum, with a transition zone noted within the left lower quadrant. 2. Small hiatal hernia. 3. Evidence of prior cholecystectomy and prior hysterectomy. Electronically Signed   By: Virgina Norfolk M.D.   On: 01/11/2021 05:32   Portable chest 1 View  Result Date: 01/11/2021 CLINICAL DATA:  Per  orders: nausea and NG tube placement. EXAM: PORTABLE CHEST - 1 VIEW COMPARISON:  09/19/2014 FINDINGS: Lungs are clear.  Nasogastric tube overlies the proximal esophagus with the tip overlying the proximal left mainstem bronchus. Heart size and mediastinal contours are within normal limits. No effusion.  No pneumothorax Visualized bones unremarkable.  Surgical clips right upper abdomen. IMPRESSION: 1. Nasogastric tube tip overlies proximal left mainstem bronchus. Electronically Signed   By: Lucrezia Europe M.D.   On: 01/11/2021 09:04   DG Abd Portable 1V-Small Bowel Obstruction Protocol-initial, 8 hr delay  Result Date: 01/12/2021 CLINICAL DATA:  Small bowel follow at 8 hours, initial encounter EXAM: PORTABLE ABDOMEN - 1 VIEW COMPARISON:  Film from earlier in the same day. FINDINGS: Gastric catheter is noted barely within the stomach. Proximal side port lies within the distal esophagus. A small amount of contrast is noted within the stomach. No significant contrast is noted within the large or small bowel. No obstructive changes are seen however. Degenerative changes of lumbar spine are noted. IMPRESSION: No significant contrast material is noted within the large or small bowel. Electronically Signed   By: Inez Catalina M.D.   On: 01/12/2021 00:31   DG Abd Portable 1V  Result Date: 01/11/2021 CLINICAL DATA:  80 year old female status post advanced NG tube. EXAM: PORTABLE ABDOMEN - 1 VIEW COMPARISON:  0818 hours today. FINDINGS: Portable AP supine view at 0952 hours. Enteric tube has been advanced. Tip is now at the level of the gastroesophageal junction. Side hole is at the distal thoracic esophagus. Negative visible lung bases. Stable visible abdomen. Lumbar levoconvex scoliosis and degeneration. IMPRESSION: Enteric tube advanced with tip now at the level of the GEJ. Advanced an additional 12-14 cm to ensure side hole placement within the stomach. Electronically Signed   By: Genevie Ann M.D.   On: 01/11/2021 10:32   DG Abd Portable 1V-Small Bowel Protocol-Position Verification  Result Date: 01/11/2021 CLINICAL DATA:  Per orders:  nausea and NG tube placement. EXAM: PORTABLE ABDOMEN - 1 VIEW COMPARISON:  CT from earlier the same day FINDINGS: Nasogastric tube tip overlies the mid esophagus just inferior to the left mainstem bronchus. Bowel gas pattern normal. Lumbar levoscoliosis with multilevel spondylitic change. IMPRESSION: Nasogastric tube tip in the midesophagus. Electronically Signed   By: Lucrezia Europe M.D.   On: 01/11/2021 09:05    Anti-infectives: Anti-infectives (From admission, onward)    None       Assessment/Plan: Small bowel obstruction - gas pattern on plain films improved; will repeat x-rays today Continue NG suction Encourage mobilization Continue rehydration - Na up to 127  If no improvement over the next day or two, may need to discuss surgery.  LOS: 1 day    Maia Petties 01/12/2021

## 2021-01-13 ENCOUNTER — Inpatient Hospital Stay (HOSPITAL_COMMUNITY): Payer: Medicare Other

## 2021-01-13 DIAGNOSIS — K56609 Unspecified intestinal obstruction, unspecified as to partial versus complete obstruction: Secondary | ICD-10-CM | POA: Diagnosis not present

## 2021-01-13 LAB — CBC
HCT: 38 % (ref 36.0–46.0)
Hemoglobin: 12.6 g/dL (ref 12.0–15.0)
MCH: 34.2 pg — ABNORMAL HIGH (ref 26.0–34.0)
MCHC: 33.2 g/dL (ref 30.0–36.0)
MCV: 103.3 fL — ABNORMAL HIGH (ref 80.0–100.0)
Platelets: 257 10*3/uL (ref 150–400)
RBC: 3.68 MIL/uL — ABNORMAL LOW (ref 3.87–5.11)
RDW: 13.7 % (ref 11.5–15.5)
WBC: 21 10*3/uL — ABNORMAL HIGH (ref 4.0–10.5)
nRBC: 0 % (ref 0.0–0.2)

## 2021-01-13 LAB — COMPREHENSIVE METABOLIC PANEL
ALT: 14 U/L (ref 0–44)
AST: 19 U/L (ref 15–41)
Albumin: 2.8 g/dL — ABNORMAL LOW (ref 3.5–5.0)
Alkaline Phosphatase: 37 U/L — ABNORMAL LOW (ref 38–126)
Anion gap: 3 — ABNORMAL LOW (ref 5–15)
BUN: 7 mg/dL — ABNORMAL LOW (ref 8–23)
CO2: 27 mmol/L (ref 22–32)
Calcium: 8.2 mg/dL — ABNORMAL LOW (ref 8.9–10.3)
Chloride: 108 mmol/L (ref 98–111)
Creatinine, Ser: 0.54 mg/dL (ref 0.44–1.00)
GFR, Estimated: >60 mL/min (ref >60)
Glucose, Bld: 155 mg/dL — ABNORMAL HIGH (ref 70–99)
Potassium: 5.4 mmol/L — ABNORMAL HIGH (ref 3.5–5.1)
Sodium: 138 mmol/L (ref 135–145)
Total Bilirubin: 0.7 mg/dL (ref 0.3–1.2)
Total Protein: 5.6 g/dL — ABNORMAL LOW (ref 6.5–8.1)

## 2021-01-13 LAB — GLUCOSE, CAPILLARY
Glucose-Capillary: 144 mg/dL — ABNORMAL HIGH (ref 70–99)
Glucose-Capillary: 140 mg/dL — ABNORMAL HIGH (ref 70–99)
Glucose-Capillary: 146 mg/dL — ABNORMAL HIGH (ref 70–99)

## 2021-01-13 LAB — MAGNESIUM: Magnesium: 2.1 mg/dL (ref 1.7–2.4)

## 2021-01-13 MED ORDER — DEXTROSE-NACL 5-0.9 % IV SOLN: INTRAVENOUS | Status: DC

## 2021-01-13 MED ORDER — SODIUM CHLORIDE 0.9 % IV SOLN: INTRAVENOUS | Status: DC

## 2021-01-13 NOTE — Progress Notes (Signed)
PROGRESS NOTE    Laura Barnes  [id number] DOB: 1940/11/22 DOA: 01/11/2021 PCP: Binnie Rail, MD  Brief Narrative: 80 year old female admitted with nausea vomiting constipation abdominal distention found to have small bowel obstruction.  Surgery has been consulted patient had NG tube placed, NG tube was taken out 01/13/2021.  She has a past medical history significant for NSAID induced GI bleeding and anemia anxiety fibromyalgia depression hypothyroidism migraine hyperlipidemia grade 1 diastolic dysfunction IBS chronic pain syndrome and multiple drug allergies and intolerances.   Assessment & Plan:   Active Problems:   SBO (small bowel obstruction) (HCC)   Small bowel obstruction (HCC)   Encounter for imaging study to confirm nasogastric (NG) tube placement   Encounter for imaging study to confirm nasogastric tube placement   #1 small bowel obstruction -NG tube DC'd 01/13/2021 by general surgery.     CT of the abdomen shows small bowel obstruction originating within the distal jejunum with transition zone noted within the left lower quadrant prior cholecystectomy hysterectomy and small hiatal hernia. KUB 01/13/2021 interval advancement of enteric tube with the tip overlying the gastric body. Encourage patient to ambulate out of bed as much as possible. Appreciate surgery input. Leukocytosis -likely reactive no signs of active infection noted so far.  UA does not appear to have UTI. Change IV fluids to NS at 75 cc an hour and DC D5.  #2 hypokalemia and hyponatremia-resolved.  Potassium 5.4.  DC potassium replacement.  Mag 2.1.  Sodium 138.  Change fluids to NS at 75 cc an hour.  #3 history of hypertension on as needed hydralazine takes Maxide at home.  Blood pressure 176/86.  #4 hypothyroidism on Synthroid TSH 0.996.  #5 diastolic CHF stable monitor closely with IV fluids.  #6 anxiety and depression restart home meds when able to take p.o.  #7 GERD continue PPI  #8  fibromyalgia continue supportive treatment  #9 anemia chronic follow closely  Estimated body mass index is 25.23 kg/m as calculated from the following:   Height as of this encounter: 4' 11.25" (1.505 m).   Weight as of this encounter: 57.2 kg.  DVT prophylaxis: lovenox Code Status: full Family Communication: none at bed side  Disposition Plan:  Status is: Inpatient  Remains inpatient appropriate because:Persistent severe electrolyte disturbances  Dispo: The patient is from: Home              Anticipated d/c is to: Home              Patient currently is not medically stable to d/c.   Difficult to place patient No    Consultants: surgery  Procedures: ngt Antimicrobials none   Subjective: Earlier when I saw her she had the NG tube in place KUB from 01/12/2021 NG tube was in the GE junction Repeat KUB at 8/15 with gastric body Later when I saw her NG tube was taken out patient was complaining of nausea and wanting the Foley catheter taken out as she felt pressure in the lower abdomen Foley is draining well. Objective: Vitals:   01/12/21 2117 01/13/21 0108 01/13/21 0520 01/13/21 0944  BP: (!) 159/80 (!) 157/75 (!) 159/73 (!) 161/77  Pulse: (!) 119 (!) 117 (!) 118 (!) 111  Resp: '18 18 18 12  '$ Temp:  98 F (36.7 C) 98.8 F (37.1 C) 98 F (36.7 C)  TempSrc:  Oral Oral Oral  SpO2: 100% 96% 98% 96%  Weight:      Height:  Intake/Output Summary (Last 24 hours) at 01/13/2021 1004 Last data filed at 01/13/2021 0600 Gross per 24 hour  Intake 2290.18 ml  Output 1300 ml  Net 990.18 ml    Filed Weights   01/11/21 1132  Weight: 57.2 kg    Examination:  General exam: Appears calm and comfortable  Respiratory system: Clear to auscultation. Respiratory effort normal. Cardiovascular system: S1 & S2 heard, RRR. No JVD, murmurs, rubs, gallops or clicks. No pedal edema. Gastrointestinal system: Abdomen is nondistended, soft and mild tender. No organomegaly or masses felt. no  bowel sounds heard. Central nervous system: Alert and oriented. No focal neurological deficits. Extremities: Symmetric 5 x 5 power. Skin: No rashes, lesions or ulcers Psychiatry: Judgement and insight appear normal. Mood & affect appropriate.     Data Reviewed: I have personally reviewed following labs and imaging studies  CBC: Recent Labs  Lab 01/11/21 0321 01/12/21 0438 01/13/21 0358  WBC 16.7* 21.0* 21.0*  HGB 14.5 14.9 12.6  HCT 40.6 42.2 38.0  MCV 96.0 97.5 103.3*  PLT 289 296 [id number]    Basic Metabolic Panel: Recent Labs  Lab 01/11/21 0321 01/11/21 1340 01/12/21 0438  NA 128* 125* 127*  K 3.1* 4.2 4.8  CL 89* 90* 95*  CO2 '23 25 26  '$ GLUCOSE 182* 138* 178*  BUN 8 7* 8  CREATININE 0.47 0.44 0.46  CALCIUM 10.0 8.4* 8.3*  MG  --  1.6*  --   PHOS  --  2.6  --     GFR: Estimated Creatinine Clearance: 43.6 mL/min (by C-G formula based on SCr of 0.46 mg/dL). Liver Function Tests: Recent Labs  Lab 01/11/21 0321 01/12/21 0438  AST 29 22  ALT 22 17  ALKPHOS 40 33*  BILITOT 0.8 0.9  PROT 8.0 6.3*  ALBUMIN 4.8 3.2*    Recent Labs  Lab 01/11/21 0321  LIPASE 21    No results for input(s): AMMONIA in the last 168 hours. Coagulation Profile: No results for input(s): INR, PROTIME in the last 168 hours. Cardiac Enzymes: No results for input(s): CKTOTAL, CKMB, CKMBINDEX, TROPONINI in the last 168 hours. BNP (last 3 results) No results for input(s): PROBNP in the last 8760 hours. HbA1C: Recent Labs    01/11/21 0321  HGBA1C 5.3    CBG: Recent Labs  Lab 01/12/21 0627 01/12/21 1111 01/12/21 1749 01/12/21 2347 01/13/21 0613  GLUCAP 170* 128* 154* 136* 144*    Lipid Profile: No results for input(s): CHOL, HDL, LDLCALC, TRIG, CHOLHDL, LDLDIRECT in the last 72 hours. Thyroid Function Tests: Recent Labs    01/11/21 1340  TSH 0.996    Anemia Panel: No results for input(s): VITAMINB12, FOLATE, FERRITIN, TIBC, IRON, RETICCTPCT in the last 72  hours. Sepsis Labs: Recent Labs  Lab 01/11/21 1340  LATICACIDVEN 1.3     Recent Results (from the past 240 hour(s))  Resp Panel by RT-PCR (Flu A&B, Covid) Nasopharyngeal Swab     Status: None   Collection Time: 01/11/21  5:47 AM   Specimen: Nasopharyngeal Swab; Nasopharyngeal(NP) swabs in vial transport medium  Result Value Ref Range Status   SARS Coronavirus 2 by RT PCR NEGATIVE NEGATIVE Final    Comment: (NOTE) SARS-CoV-2 target nucleic acids are NOT DETECTED.  The SARS-CoV-2 RNA is generally detectable in upper respiratory specimens during the acute phase of infection. The lowest concentration of SARS-CoV-2 viral copies this assay can detect is 138 copies/mL. A negative result does not preclude SARS-Cov-2 infection and should not be used as the sole  basis for treatment or other patient management decisions. A negative result may occur with  improper specimen collection/handling, submission of specimen other than nasopharyngeal swab, presence of viral mutation(s) within the areas targeted by this assay, and inadequate number of viral copies(<138 copies/mL). A negative result must be combined with clinical observations, patient history, and epidemiological information. The expected result is Negative.  Fact Sheet for Patients:  EntrepreneurPulse.com.au  Fact Sheet for Healthcare Providers:  IncredibleEmployment.be  This test is no t yet approved or cleared by the [place] FDA and  has been authorized for detection and/or diagnosis of SARS-CoV-2 by FDA under an Emergency Use Authorization (EUA). This EUA will remain  in effect (meaning this test can be used) for the duration of the COVID-19 declaration under Section 564(b)(1) of the Act, 21 U.S.C.section 360bbb-3(b)(1), unless the authorization is terminated  or revoked sooner.       Influenza A by PCR NEGATIVE NEGATIVE Final   Influenza B by PCR NEGATIVE NEGATIVE Final     Comment: (NOTE) The Xpert Xpress SARS-CoV-2/FLU/RSV plus assay is intended as an aid in the diagnosis of influenza from Nasopharyngeal swab specimens and should not be used as a sole basis for treatment. Nasal washings and aspirates are unacceptable for Xpert Xpress SARS-CoV-2/FLU/RSV testing.  Fact Sheet for Patients: EntrepreneurPulse.com.au  Fact Sheet for Healthcare Providers: IncredibleEmployment.be  This test is not yet approved or cleared by the [place] FDA and has been authorized for detection and/or diagnosis of SARS-CoV-2 by FDA under an Emergency Use Authorization (EUA). This EUA will remain in effect (meaning this test can be used) for the duration of the COVID-19 declaration under Section 564(b)(1) of the Act, 21 U.S.C. section 360bbb-3(b)(1), unless the authorization is terminated or revoked.  Performed at [facility], [place] [address], [place], [place] [address]           Radiology Studies: DG Abd Portable 1V  Result Date: 01/13/2021 CLINICAL DATA:  Nasogastric tube placement, history of small-bowel obstruction EXAM: PORTABLE ABDOMEN - 1 VIEW COMPARISON:  Previous day FINDINGS: The upper abdomen is included in the field of view. The visualized lung fields are poorly evaluated due to technique. Interval advancement of the enteric tube such that the tip overlies the gastric body. Normal bowel gas pattern. Surgical clips in the right upper quadrant. Small residual contrast material within a loop of bowel in the left upper quadrant. The osseous structures are unremarkable. IMPRESSION: Interval advancement of enteric tube with the tip overlying the gastric body. Electronically Signed   By: Albin Felling M.D.   On: 01/13/2021 08:19   DG Abd Portable 1V  Result Date: 01/12/2021 CLINICAL DATA:  Small bowel obstruction EXAM: PORTABLE ABDOMEN - 1 VIEW COMPARISON:  January 11, 2021 x-ray.  January 11, 2021 CT scan.  FINDINGS: The side port of the NG tube is above the GE junction with the distal tip at the GE junction. Lung bases are normal. There is a paucity of bowel gas but no evidence of obstruction. Mild fecal loading in the ascending colon. No other acute abnormalities identified. IMPRESSION: 1. The side port of the NG tube is above the GE junction with the distal tip at the GE junction. Recommend advancing 8 or 10 cm. 2. No evidence of bowel obstruction. 3. Mild fecal loading in the right colon. Electronically Signed   By: Dorise Bullion III M.D.   On: 01/12/2021 10:38   DG Abd Portable 1V-Small Bowel Obstruction Protocol-initial, 8 hr delay  Result Date: 01/12/2021 CLINICAL DATA:  Small bowel follow at 8 hours, initial encounter EXAM: PORTABLE ABDOMEN - 1 VIEW COMPARISON:  Film from earlier in the same day. FINDINGS: Gastric catheter is noted barely within the stomach. Proximal side port lies within the distal esophagus. A small amount of contrast is noted within the stomach. No significant contrast is noted within the large or small bowel. No obstructive changes are seen however. Degenerative changes of lumbar spine are noted. IMPRESSION: No significant contrast material is noted within the large or small bowel. Electronically Signed   By: Inez Catalina M.D.   On: 01/12/2021 00:31        Scheduled Meds:  Chlorhexidine Gluconate Cloth  6 each Topical Daily   enoxaparin (LOVENOX) injection  40 mg Subcutaneous Q24H   [START ON 01/15/2021] levothyroxine  25 mcg Intravenous Daily   pantoprazole (PROTONIX) IV  40 mg Intravenous Q24H   Continuous Infusions:  dextrose 5 % and 0.9 % NaCl with KCl 40 mEq/L 150 mL/hr at 01/13/21 0449   promethazine (PHENERGAN) injection (IM or IVPB) 12.5 mg (01/11/21 1400)     LOS: 2 days    Time spent: 40 min  Georgette Shell, MD 01/13/2021, 10:04 AM

## 2021-01-13 NOTE — Progress Notes (Signed)
Mobility Specialist - Progress Note    01/13/21 1441  Mobility  Activity Refused mobility     Cancellation/Refusal: Pt c/o of extreme pain levels, lack of sleep, and refused mobility at this time. Will check back as schedule permits.

## 2021-01-14 ENCOUNTER — Inpatient Hospital Stay (HOSPITAL_COMMUNITY): Payer: Medicare Other | Admitting: Anesthesiology

## 2021-01-14 ENCOUNTER — Encounter (HOSPITAL_COMMUNITY)
Admission: EM | Disposition: A | Discharge status: Home / Self Care | Payer: Self-pay | Source: Home / Self Care | Attending: Family Medicine

## 2021-01-14 ENCOUNTER — Inpatient Hospital Stay (HOSPITAL_COMMUNITY): Payer: Medicare Other

## 2021-01-14 DIAGNOSIS — Z0189 Encounter for other specified special examinations: Secondary | ICD-10-CM | POA: Diagnosis not present

## 2021-01-14 DIAGNOSIS — R110 Nausea: Secondary | ICD-10-CM | POA: Diagnosis not present

## 2021-01-14 DIAGNOSIS — K56609 Unspecified intestinal obstruction, unspecified as to partial versus complete obstruction: Secondary | ICD-10-CM | POA: Diagnosis not present

## 2021-01-14 HISTORY — PX: LAPAROSCOPY: SHX197

## 2021-01-14 LAB — CBC
HCT: 35.7 % — ABNORMAL LOW (ref 36.0–46.0)
Hemoglobin: 12.2 g/dL (ref 12.0–15.0)
MCH: 34.6 pg — ABNORMAL HIGH (ref 26.0–34.0)
MCHC: 34.2 g/dL (ref 30.0–36.0)
MCV: 101.1 fL — ABNORMAL HIGH (ref 80.0–100.0)
Platelets: 263 10*3/uL (ref 150–400)
RBC: 3.53 MIL/uL — ABNORMAL LOW (ref 3.87–5.11)
RDW: 13.2 % (ref 11.5–15.5)
WBC: 17.4 10*3/uL — ABNORMAL HIGH (ref 4.0–10.5)
nRBC: 0 % (ref 0.0–0.2)

## 2021-01-14 LAB — COMPREHENSIVE METABOLIC PANEL
ALT: 15 U/L (ref 0–44)
AST: 20 U/L (ref 15–41)
Albumin: 2.7 g/dL — ABNORMAL LOW (ref 3.5–5.0)
Alkaline Phosphatase: 47 U/L (ref 38–126)
Anion gap: 6 (ref 5–15)
BUN: 7 mg/dL — ABNORMAL LOW (ref 8–23)
CO2: 28 mmol/L (ref 22–32)
Calcium: 8.2 mg/dL — ABNORMAL LOW (ref 8.9–10.3)
Chloride: 103 mmol/L (ref 98–111)
Creatinine, Ser: 0.37 mg/dL — ABNORMAL LOW (ref 0.44–1.00)
GFR, Estimated: >60 mL/min (ref >60)
Glucose, Bld: 126 mg/dL — ABNORMAL HIGH (ref 70–99)
Potassium: 3.3 mmol/L — ABNORMAL LOW (ref 3.5–5.1)
Sodium: 137 mmol/L (ref 135–145)
Total Bilirubin: 0.9 mg/dL (ref 0.3–1.2)
Total Protein: 5.7 g/dL — ABNORMAL LOW (ref 6.5–8.1)

## 2021-01-14 LAB — GLUCOSE, CAPILLARY
Glucose-Capillary: 116 mg/dL — ABNORMAL HIGH (ref 70–99)
Glucose-Capillary: 120 mg/dL — ABNORMAL HIGH (ref 70–99)
Glucose-Capillary: 128 mg/dL — ABNORMAL HIGH (ref 70–99)
Glucose-Capillary: 142 mg/dL — ABNORMAL HIGH (ref 70–99)

## 2021-01-14 SURGERY — LAPAROSCOPY, DIAGNOSTIC
Anesthesia: General | Site: Abdomen

## 2021-01-14 MED ORDER — FENTANYL CITRATE (PF) 250 MCG/5ML IJ SOLN
INTRAMUSCULAR | Status: AC
  Filled 2021-01-14: qty 5

## 2021-01-14 MED ORDER — DEXAMETHASONE SODIUM PHOSPHATE 10 MG/ML IJ SOLN
INTRAMUSCULAR | Status: AC
  Filled 2021-01-14: qty 1

## 2021-01-14 MED ORDER — HYDROMORPHONE HCL 1 MG/ML IJ SOLN: 0.2500 mg | INTRAMUSCULAR | Status: DC | PRN

## 2021-01-14 MED ORDER — MIDAZOLAM HCL 5 MG/5ML IJ SOLN
INTRAMUSCULAR | Status: DC | PRN
  Administered 2021-01-14: 1 mg via INTRAVENOUS

## 2021-01-14 MED ORDER — ACETAMINOPHEN 650 MG RE SUPP
650.0000 mg | Freq: Four times a day (QID) | RECTAL | Status: DC | PRN
  Administered 2021-01-17: 650 mg via RECTAL
  Filled 2021-01-14 (×2): qty 1

## 2021-01-14 MED ORDER — SODIUM CHLORIDE 0.9 % IV SOLN
2.0000 g | INTRAVENOUS | Status: AC
  Administered 2021-01-14: 2 g via INTRAVENOUS
  Filled 2021-01-14: qty 2

## 2021-01-14 MED ORDER — BUPIVACAINE-EPINEPHRINE (PF) 0.25% -1:200000 IJ SOLN
INTRAMUSCULAR | Status: DC | PRN
  Administered 2021-01-14: 30 mL

## 2021-01-14 MED ORDER — PROPOFOL 10 MG/ML IV BOLUS
INTRAVENOUS | Status: AC
  Filled 2021-01-14: qty 20

## 2021-01-14 MED ORDER — LABETALOL HCL 5 MG/ML IV SOLN
INTRAVENOUS | Status: DC | PRN
  Administered 2021-01-14 (×3): 2.5 mg via INTRAVENOUS

## 2021-01-14 MED ORDER — ROCURONIUM BROMIDE 10 MG/ML (PF) SYRINGE
PREFILLED_SYRINGE | INTRAVENOUS | Status: DC | PRN
  Administered 2021-01-14: 40 mg via INTRAVENOUS

## 2021-01-14 MED ORDER — FENTANYL CITRATE (PF) 100 MCG/2ML IJ SOLN
INTRAMUSCULAR | Status: AC
  Administered 2021-01-14: 50 ug via INTRAVENOUS
  Filled 2021-01-14: qty 2

## 2021-01-14 MED ORDER — FENTANYL CITRATE (PF) 100 MCG/2ML IJ SOLN
INTRAMUSCULAR | Status: DC | PRN
  Administered 2021-01-14: 100 ug via INTRAVENOUS
  Administered 2021-01-14 (×4): 50 ug via INTRAVENOUS
  Administered 2021-01-14: 150 ug via INTRAVENOUS

## 2021-01-14 MED ORDER — HYDROMORPHONE HCL 2 MG/ML IJ SOLN
INTRAMUSCULAR | Status: AC
  Filled 2021-01-14: qty 1

## 2021-01-14 MED ORDER — BUPIVACAINE LIPOSOME 1.3 % IJ SUSP
20.0000 mL | Freq: Once | INTRAMUSCULAR | Status: AC
  Administered 2021-01-14: 20 mL
  Filled 2021-01-14 (×2): qty 20

## 2021-01-14 MED ORDER — BUPIVACAINE-EPINEPHRINE (PF) 0.25% -1:200000 IJ SOLN
INTRAMUSCULAR | Status: AC
  Filled 2021-01-14: qty 30

## 2021-01-14 MED ORDER — SODIUM CHLORIDE 0.9 % IV SOLN: 2.0000 g | INTRAVENOUS | Status: DC

## 2021-01-14 MED ORDER — BUPIVACAINE-EPINEPHRINE 0.25% -1:200000 IJ SOLN
INTRAMUSCULAR | Status: AC
  Filled 2021-01-14: qty 1

## 2021-01-14 MED ORDER — HYDROMORPHONE HCL 1 MG/ML IJ SOLN
INTRAMUSCULAR | Status: DC | PRN
  Administered 2021-01-14 (×3): .5 mg via INTRAVENOUS

## 2021-01-14 MED ORDER — LACTATED RINGERS IV BOLUS
1000.0000 mL | Freq: Three times a day (TID) | INTRAVENOUS | Status: AC | PRN
  Administered 2021-01-16 (×2): 1000 mL via INTRAVENOUS

## 2021-01-14 MED ORDER — ROCURONIUM BROMIDE 10 MG/ML (PF) SYRINGE
PREFILLED_SYRINGE | INTRAVENOUS | Status: AC
  Filled 2021-01-14: qty 10

## 2021-01-14 MED ORDER — ONDANSETRON HCL 4 MG/2ML IJ SOLN
INTRAMUSCULAR | Status: DC | PRN
  Administered 2021-01-14: 4 mg via INTRAVENOUS

## 2021-01-14 MED ORDER — SUCCINYLCHOLINE CHLORIDE 200 MG/10ML IV SOSY
PREFILLED_SYRINGE | INTRAVENOUS | Status: AC
  Filled 2021-01-14: qty 10

## 2021-01-14 MED ORDER — SIMETHICONE 40 MG/0.6ML PO SUSP
80.0000 mg | Freq: Four times a day (QID) | ORAL | Status: DC | PRN
  Administered 2021-01-17 – 2021-01-22 (×3): 80 mg via ORAL
  Filled 2021-01-14 (×7): qty 1.2

## 2021-01-14 MED ORDER — 0.9 % SODIUM CHLORIDE (POUR BTL) OPTIME
TOPICAL | Status: DC | PRN
  Administered 2021-01-14: 1000 mL

## 2021-01-14 MED ORDER — SUCCINYLCHOLINE CHLORIDE 200 MG/10ML IV SOSY
PREFILLED_SYRINGE | INTRAVENOUS | Status: DC | PRN
  Administered 2021-01-14: 100 mg via INTRAVENOUS

## 2021-01-14 MED ORDER — ORAL CARE MOUTH RINSE: 15.0000 mL | Freq: Once | OROMUCOSAL | Status: AC

## 2021-01-14 MED ORDER — METHOCARBAMOL 1000 MG/10ML IJ SOLN
1000.0000 mg | Freq: Four times a day (QID) | INTRAVENOUS | Status: DC | PRN
  Administered 2021-01-15: 1000 mg via INTRAVENOUS
  Filled 2021-01-14 (×3): qty 10

## 2021-01-14 MED ORDER — LABETALOL HCL 5 MG/ML IV SOLN
INTRAVENOUS | Status: AC
  Filled 2021-01-14: qty 4

## 2021-01-14 MED ORDER — LIDOCAINE 2% (20 MG/ML) 5 ML SYRINGE
INTRAMUSCULAR | Status: DC | PRN
Start: 2021-01-14 — End: 2021-01-14
  Administered 2021-01-14: 50 mg via INTRAVENOUS

## 2021-01-14 MED ORDER — SODIUM CHLORIDE 0.9 % IV SOLN
2.0000 g | Freq: Two times a day (BID) | INTRAVENOUS | Status: AC
  Administered 2021-01-14: 2 g via INTRAVENOUS
  Filled 2021-01-14: qty 2

## 2021-01-14 MED ORDER — ALBUMIN HUMAN 5 % IV SOLN: INTRAVENOUS | Status: DC | PRN

## 2021-01-14 MED ORDER — MAGIC MOUTHWASH
15.0000 mL | Freq: Four times a day (QID) | ORAL | Status: DC
  Administered 2021-01-14 – 2021-01-16 (×5): 15 mL via ORAL
  Filled 2021-01-14 (×8): qty 15

## 2021-01-14 MED ORDER — LACTATED RINGERS IV SOLN: INTRAVENOUS | Status: DC

## 2021-01-14 MED ORDER — CHLORHEXIDINE GLUCONATE 0.12 % MT SOLN
15.0000 mL | Freq: Once | OROMUCOSAL | Status: AC
  Administered 2021-01-14: 15 mL via OROMUCOSAL

## 2021-01-14 MED ORDER — LIDOCAINE 2% (20 MG/ML) 5 ML SYRINGE
INTRAMUSCULAR | Status: AC
  Filled 2021-01-14: qty 5

## 2021-01-14 MED ORDER — ONDANSETRON HCL 4 MG/2ML IJ SOLN
INTRAMUSCULAR | Status: AC
  Filled 2021-01-14: qty 2

## 2021-01-14 MED ORDER — MIDAZOLAM HCL 2 MG/2ML IJ SOLN
INTRAMUSCULAR | Status: AC
  Filled 2021-01-14: qty 2

## 2021-01-14 MED ORDER — ENALAPRILAT 1.25 MG/ML IV SOLN
0.6250 mg | Freq: Four times a day (QID) | INTRAVENOUS | Status: DC | PRN
  Filled 2021-01-14: qty 1

## 2021-01-14 MED ORDER — FENTANYL CITRATE (PF) 100 MCG/2ML IJ SOLN: 50.0000 ug | Freq: Once | INTRAMUSCULAR | Status: AC

## 2021-01-14 MED ORDER — PROPOFOL 10 MG/ML IV BOLUS
INTRAVENOUS | Status: DC | PRN
  Administered 2021-01-14: 30 mg via INTRAVENOUS
  Administered 2021-01-14: 80 mg via INTRAVENOUS

## 2021-01-14 SURGICAL SUPPLY — 52 items
BAG COUNTER SPONGE SURGICOUNT (BAG) IMPLANT
BAG SPNG CNTER NS LX DISP (BAG)
CABLE HIGH FREQUENCY MONO STRZ (ELECTRODE) ×2 IMPLANT
COVER SURGICAL LIGHT HANDLE (MISCELLANEOUS) ×2 IMPLANT
DECANTER SPIKE VIAL GLASS SM (MISCELLANEOUS) ×2 IMPLANT
DRAPE UTILITY XL STRL (DRAPES) ×2 IMPLANT
DRAPE WARM FLUID 44X44 (DRAPES) ×2 IMPLANT
DRSG OPSITE POSTOP 4X6 (GAUZE/BANDAGES/DRESSINGS) ×1 IMPLANT
DRSG TEGADERM 2-3/8X2-3/4 SM (GAUZE/BANDAGES/DRESSINGS) ×1 IMPLANT
DRSG TEGADERM 4X4.75 (GAUZE/BANDAGES/DRESSINGS) IMPLANT
ELECT PENCIL ROCKER SW 15FT (MISCELLANEOUS) ×1 IMPLANT
ELECT REM PT RETURN 15FT ADLT (MISCELLANEOUS) ×2 IMPLANT
GAUZE SPONGE 2X2 8PLY STRL LF (GAUZE/BANDAGES/DRESSINGS) ×1 IMPLANT
GLOVE SURG NEOPR MICRO LF SZ8 (GLOVE) ×2 IMPLANT
GLOVE SURG UNDER LTX SZ8 (GLOVE) ×2 IMPLANT
GOWN STRL REUS W/TWL XL LVL3 (GOWN DISPOSABLE) ×6 IMPLANT
IRRIG SUCT STRYKERFLOW 2 WTIP (MISCELLANEOUS) ×2
IRRIGATION SUCT STRKRFLW 2 WTP (MISCELLANEOUS) ×1 IMPLANT
KIT BASIN OR (CUSTOM PROCEDURE TRAY) ×2 IMPLANT
KIT TURNOVER KIT A (KITS) ×2 IMPLANT
PACK COLON (CUSTOM PROCEDURE TRAY) ×1 IMPLANT
PAD POSITIONING PINK XL (MISCELLANEOUS) ×2 IMPLANT
PROTECTOR NERVE ULNAR (MISCELLANEOUS) IMPLANT
RELOAD PROXIMATE 75MM BLUE (ENDOMECHANICALS) ×2 IMPLANT
RELOAD STAPLE 75 3.8 BLU REG (ENDOMECHANICALS) IMPLANT
SCISSORS LAP 5X35 DISP (ENDOMECHANICALS) ×2 IMPLANT
SEALER TISSUE G2 STRG ARTC 35C (ENDOMECHANICALS) IMPLANT
SET BERKELEY SUCTION TUBING (SUCTIONS) ×2 IMPLANT
SET TUBE SMOKE EVAC HIGH FLOW (TUBING) ×2 IMPLANT
SLEEVE XCEL OPT CAN 5 100 (ENDOMECHANICALS) ×4 IMPLANT
SPONGE GAUZE 2X2 8PLY STRL LF (GAUZE/BANDAGES/DRESSINGS) ×1 IMPLANT
SPONGE GAUZE 2X2 STER 10/PKG (GAUZE/BANDAGES/DRESSINGS) ×1
STAPLER 90 3.5 STAND SLIM (STAPLE) ×2
STAPLER 90 3.5 STD SLIM (STAPLE) IMPLANT
STAPLER PROXIMATE 75MM BLUE (STAPLE) ×1 IMPLANT
SUT MNCRL AB 4-0 PS2 18 (SUTURE) ×2 IMPLANT
SUT PDS AB 1 TP1 54 (SUTURE) ×2 IMPLANT
SUT SILK 2 0 (SUTURE) ×2
SUT SILK 2 0 SH CR/8 (SUTURE) ×3 IMPLANT
SUT SILK 2-0 18XBRD TIE 12 (SUTURE) ×1 IMPLANT
SUT SILK 3 0 (SUTURE) ×2
SUT SILK 3 0 SH CR/8 (SUTURE) ×2 IMPLANT
SUT SILK 3-0 18XBRD TIE 12 (SUTURE) ×1 IMPLANT
SYS WOUND ALEXIS 18CM MED (MISCELLANEOUS) ×2
SYSTEM WOUND ALEXIS 18CM MED (MISCELLANEOUS) IMPLANT
TOWEL OR 17X26 10 PK STRL BLUE (TOWEL DISPOSABLE) ×2 IMPLANT
TOWEL OR NON WOVEN STRL DISP B (DISPOSABLE) ×2 IMPLANT
TRAY FOLEY MTR SLVR 16FR STAT (SET/KITS/TRAYS/PACK) IMPLANT
TRAY LAPAROSCOPIC (CUSTOM PROCEDURE TRAY) ×2 IMPLANT
TROCAR BLADELESS OPT 5 100 (ENDOMECHANICALS) ×2 IMPLANT
TROCAR XCEL NON-BLD 11X100MML (ENDOMECHANICALS) IMPLANT
YANKAUER SUCT BULB TIP NO VENT (SUCTIONS) ×1 IMPLANT

## 2021-01-14 NOTE — Progress Notes (Signed)
Progress Note     Subjective: NG out yesterday. She complains of nausea, emesis, and increased bloating and abdominal pain this morning. She also has hiccoughs. She had one episode of emesis this am with 300 ml out charted. She is not passing flatus. No BM in several days. She states she has been OOB  Objective: Vital signs in last 24 hours: Temp:  [97.5 F (36.4 C)-99.3 F (37.4 C)] 98.2 F (36.8 C) (08/16 0546) Pulse Rate:  [89-118] 89 (08/16 0546) Resp:  [12-19] 17 (08/16 0546) BP: (149-178)/(74-88) 149/74 (08/16 0546) SpO2:  [96 %-99 %] 97 % (08/16 0546) Last BM Date:  (patient cannot recall)  Intake/Output from previous day: 08/15 0701 - 08/16 0700 In: 2783.6 [P.O.:240; I.V.:2543.6] Out: 2450 [Urine:2150; Emesis/NG output:300] Intake/Output this shift: No intake/output data recorded.  PE: General: pleasant, WD, female who is laying in bed uncomfortable appearing HEENT: head is normocephalic, atraumatic.  Mouth is pink and moist Heart: Palpable radial pulses bilaterally Lungs: Respiratory effort nonlabored Abd: soft, +BS. Mild to moderate distension. Mild tenderness to palpation greatest over lower abdomen without rebound or guarding MSK: all 4 extremities are symmetrical with no cyanosis, clubbing, or edema. Skin: warm and dry with no masses, lesions, or rashes Psych: A&Ox3 with an appropriate affect.    Lab Results:  Recent Labs    01/13/21 0358 01/14/21 0817  WBC 21.0* 17.4*  HGB 12.6 12.2  HCT 38.0 35.7*  PLT 257 263   BMET Recent Labs    01/12/21 0438 01/13/21 0358  NA 127* 138  K 4.8 5.4*  CL 95* 108  CO2 26 27  GLUCOSE 178* 155*  BUN 8 7*  CREATININE 0.46 0.54  CALCIUM 8.3* 8.2*   PT/INR No results for input(s): LABPROT, INR in the last 72 hours. CMP     Component Value Date/Time   NA 138 01/13/2021 0358   K 5.4 (H) 01/13/2021 0358   CL 108 01/13/2021 0358   CO2 27 01/13/2021 0358   GLUCOSE 155 (H) 01/13/2021 0358   BUN 7 (L)  01/13/2021 0358   CREATININE 0.54 01/13/2021 0358   CALCIUM 8.2 (L) 01/13/2021 0358   PROT 5.6 (L) 01/13/2021 0358   ALBUMIN 2.8 (L) 01/13/2021 0358   AST 19 01/13/2021 0358   ALT 14 01/13/2021 0358   ALKPHOS 37 (L) 01/13/2021 0358   BILITOT 0.7 01/13/2021 0358   GFRNONAA >60 01/13/2021 0358   GFRAA >60 04/23/2017 0645   Lipase     Component Value Date/Time   LIPASE 21 01/11/2021 0321       Studies/Results: DG Abd Portable 1V  Result Date: 01/13/2021 CLINICAL DATA:  Nasogastric tube placement, history of small-bowel obstruction EXAM: PORTABLE ABDOMEN - 1 VIEW COMPARISON:  Previous day FINDINGS: The upper abdomen is included in the field of view. The visualized lung fields are poorly evaluated due to technique. Interval advancement of the enteric tube such that the tip overlies the gastric body. Normal bowel gas pattern. Surgical clips in the right upper quadrant. Small residual contrast material within a loop of bowel in the left upper quadrant. The osseous structures are unremarkable. IMPRESSION: Interval advancement of enteric tube with the tip overlying the gastric body. Electronically Signed   By: Albin Felling M.D.   On: 01/13/2021 08:19   DG Abd Portable 1V  Result Date: 01/12/2021 CLINICAL DATA:  Small bowel obstruction EXAM: PORTABLE ABDOMEN - 1 VIEW COMPARISON:  January 11, 2021 x-ray.  January 11, 2021 CT scan. FINDINGS: The  side port of the NG tube is above the GE junction with the distal tip at the GE junction. Lung bases are normal. There is a paucity of bowel gas but no evidence of obstruction. Mild fecal loading in the ascending colon. No other acute abnormalities identified. IMPRESSION: 1. The side port of the NG tube is above the GE junction with the distal tip at the GE junction. Recommend advancing 8 or 10 cm. 2. No evidence of bowel obstruction. 3. Mild fecal loading in the right colon. Electronically Signed   By: Dorise Bullion III M.D.   On: 01/12/2021 10:38     Anti-infectives: Anti-infectives (From admission, onward)    None        Assessment/Plan Small bowel obstruction  - likely adhesive in setting of history of lap chole and hysterectomy - NG out yesterday. Emesis and distension this am. No peritonitis. At this point may benefit from NGT replacement and repeat protocol vs laparoscopic evaluation. She is agreeable to either option. Will monitor for now, keep NPO, and discuss with MD - leukocytosis improving - 17.4 (21.0). monitor  FEN: NPO, IVF per primary ID: none currently VTE: lovenox  Hypokalemia/hyponatremia HTN Hypothyroidism Diastolic CHF Anxiety/depression GERD Fibromyalgia anemia   LOS: 3 days    Winferd Humphrey, [facility] Surgery 01/14/2021, 8:59 AM Please see Amion for pager number during day hours 7:00am-4:30pm

## 2021-01-14 NOTE — Anesthesia Procedure Notes (Signed)
Procedure Name: Intubation Date/Time: 01/14/2021 1:18 PM Performed by: Lissa Morales, CRNA Pre-anesthesia Checklist: Patient identified, Emergency Drugs available, Suction available and Patient being monitored Patient Re-evaluated:Patient Re-evaluated prior to induction Oxygen Delivery Method: Circle system utilized Preoxygenation: Pre-oxygenation with 100% oxygen Induction Type: IV induction, Rapid sequence and Cricoid Pressure applied Laryngoscope Size: Mac and 3 Tube type: Oral Number of attempts: 1 Airway Equipment and Method: Stylet and Oral airway Placement Confirmation: ETT inserted through vocal cords under direct vision, positive ETCO2 and breath sounds checked- equal and bilateral Secured at: 22 cm Tube secured with: Tape Dental Injury: Teeth and Oropharynx as per pre-operative assessment

## 2021-01-14 NOTE — Op Note (Signed)
01/14/2021  3:25 PM  PATIENT:  Laura Barnes  80 y.o. female  Patient Care Team: Binnie Rail, MD as PCP - General (Internal Medicine) Bo Merino, MD as Consulting Physician (Rheumatology) Dorothy Spark, MD (Inactive) as Consulting Physician (Cardiology) [name], MD as Consulting Physician (Otolaryngology) Lyndal Pulley, DO as Consulting Physician (Family Medicine) Brand Males, MD as Consulting Physician (Pulmonary Disease) Magnus Sinning, MD as Consulting Physician (Physical Medicine and Rehabilitation) Ashok Pall, MD as Consulting Physician (Neurosurgery) Charlton Haws, [facility] as Pharmacist (Pharmacist)  PRE-OPERATIVE DIAGNOSIS:  SMALL BOWEL OBSTRUCTION  POST-OPERATIVE DIAGNOSIS:  CLOSED LOOP STRANGULATED SMALL BOWEL OBSTRUCTION   PROCEDURE:   LAPAROSCOPIC LYSIS OF ADHESIONS SMALL BOWEL RESECTION COLON SEROSAL REPAIR  SURGEON:  Adin Hector, MD, FACS  ASSISTANT: Romana Juniper, MD, FACS  ANESTHESIA:   local and general  EBL:  Total I/O In: 2725 [I.V.:2325; IV Piggyback:400] Out: 2150 [Urine:350; Emesis/NG output:500; Other:1100; Blood:200].  See anesthesia record  Delay start of Pharmacological VTE agent (>24hrs) due to surgical blood loss or risk of bleeding:  no  DRAINS: none   SPECIMEN:  JEJUNUM  DISPOSITION OF SPECIMEN:  PATHOLOGY  COUNTS:  YES  PLAN OF CARE: Admit to inpatient   PATIENT DISPOSITION:  PACU - hemodynamically stable.  INDICATION: Elderly woman with prior surgeries including cholecystectomy and hysterectomy.  Came in with nausea vomiting and x-ray concerning for bowel obstruction.  Transition zone in the pelvis.  Placed on small bowel protocol.  Seem to be resolving clinically and initially tolerated clamping trial.  However had episode of worsening emesis and discomfort.  Because lack of improvement after 3 days, we offered diagnostic possible open exploration with lyse lesions.  Possible bowel  resection.  The anatomy & physiology of the digestive tract was discussed.  The pathophysiology of perforation was discussed.  Differential diagnosis such as perforated ulcer or colon, etc was discussed.   Natural history risks without surgery such as death was discussed.  I recommended abdominal exploration to diagnose & treat the source of the problem.  Laparoscopic & open techniques were discussed.   Risks such as bleeding, infection, abscess, leak, reoperation, bowel resection, possible ostomy, injury to other organs, need for repair of tissues / organs, hernia, heart attack, death, and other risks were discussed.   The risks of no intervention will lead to serious problems including death.   I expressed a good likelihood that surgery will address the problem.    Goals of post-operative recovery were discussed as well.  We will work to minimize complications although risks in an emergent setting are high.   Questions were answered.  The patient expressed understanding & wishes to proceed with surgery.      OR FINDINGS: 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  Obvious transition zone point.  Significant ischemia and necrosis.  No perforation.  Resection done with anastomosis.  No other interloop adhesions.  Moderate adhesions around floppy cecal bascule freed off.  Small serosal repair done.  Omentum freed off to allow to be placed down into the pelvis.  CASE DATA:  Type of patient?: LDOW CASE (Surgical Hospitalist [facility] Inpatient)  Status of Case? URGENT Add On  Infection Present At Time Of Surgery (PATOS)?  PHLEGMON  DESCRIPTION:  Informed consent was confirmed.  The patient underwent general anaesthesia without difficulty.  The patient was positioned appropriately.  VTE prevention in place.  The patient's abdomen was  clipped, prepped, & draped in a sterile fashion.  Surgical timeout confirmed our plan.  Peritoneal  entry with a laparoscopic port was obtained using optical entry technique in the left upper abdomen as the patient was positioned in reverse Trendelenburg.  Entry was clean.  I induced carbon dioxide insufflation.  Camera inspection revealed no injury.  Extra ports were carefully placed under direct laparoscopic visualization.   Upon entering the abdomen (organ space), I encountered a phlegmon involving the small intestine .  Could see a transition zone going down the pelvis.  Became apparent that small bowel was trapped down there.  There seem to be a band between left adnexa remnant and colon.  Carefully elevated skeletonized and came through the band which released bowel.  It was purple with edema and phlegmon concerning for strangulation.  Without release I was able to mobilize the small bowel the pelvis and the patient had a chronically hairpin turn and closed-loop obstruction there with obvious necrosis and ischemia.  I ran the small bowel from the ileocecal region more proximally.  Clearly this was a transition zone zone was quite decompressed.  There were some adhesions in the ileocecal region and I freed those off to make sure there were no other surprises there.  Freed off some omentum as well.  Inspecting the cecum there was a white band that looks more like adhesion but I noted that area for inspection.  I created an extraction incision through a Pfannenstiel incision with a wound protector since a small bout down there.  I was able to eviscerate the area of concern.  I resected the small bowel using a side-to-side anastomosis with a 75 GIA stapler.  I used suction to aspirate the succus proximal to the transition point in the jejunum to better decompress it.  It seemed less distended and happier.  Lumens were more equivalent.  Transected across the common staple defect with a TX 90.  Took the mesentery with silk ties and clamps.  Specimen sent off.  I closed the mesenteric defect transversely to  use the mesentery to cover and protect the TX 90 staple line to good result.  I placed a silk suture at the apex of the anastomosis for an anti-tension "crotch" stitch.  We brought up the cecum.  Looked viable.  Looks like she had had a prior appendectomy.  There was a marginal thinned out serosal area.  I did interrupted silk sutures for a serosal repair there.  We allowed the bowel to return to the abdomen.  Plates over the wound protector.  Returned with diagnostic laparoscopy.  Irrigated with good return.  I freed omental adhesions off the anterior abdominal wall and right flank to allow more mobility.  I ran the small bowel from the ileocecal valve to the ligament of Treitz.  Anastomosis looked viable without any ischemia.  Caliber was more normalized.  Anesthesia had placed a nasogastric tube back in.  I had her advance it to get good decompression.  They had already aspirated about 1500 mL out of the stomach.  Stomach was well decompressed.  Stomach decompressed.  I allowed the cecal bascule to fall down and fill up the pelvis along with the rectosigmoid colon.  I evacuated carbon oxide and removed the ports.  Great omentum easily reached down to the pelvis allow that to help fill out the pelvis along with the cecal bascule and rectosigmoid colon so that small bowel would not go back down there.  We closed the  Pfannenstiel incision using a 0 Vicryl suture to close the midline peritoneal/posterior rectus fascial defect.  Closed the anterior rectus fascia transversely using #1 PDS in a running fashion.  Skin was closed using 4 Monocryl.  Sterile dressing applied.  Patient extubated and sent to recovery room in stable condition.  I discussed operative findings, updated the patient's status, discussed probable steps to recovery, and gave postoperative recommendations to the patient's son, Sherlon Handing .  Recommendations were made.  Questions were answered.  He expressed understanding &  appreciation.     Adin Hector, M.D., F.A.C.S. Gastrointestinal and Minimally Invasive Surgery [facility] [address] [place], [place] [address] [phone] Main / Paging

## 2021-01-14 NOTE — Transfer of Care (Signed)
Immediate Anesthesia Transfer of Care Note  Patient: Laura Barnes  Procedure(s) Performed: LAPAROSCOPY DIAGNOSTIC, LYSIS OF ADHESIONS, laparoscopic assisted open small bowel resection. (Abdomen)  Patient Location: PACU  Anesthesia Type:General  Level of Consciousness: awake, alert , oriented and patient cooperative  Airway & Oxygen Therapy: Patient Spontanous Breathing and Patient connected to face mask oxygen  Post-op Assessment: Report given to RN, Post -op Vital signs reviewed and stable and Patient moving all extremities X 4  Post vital signs: stable  Last Vitals:  Vitals Value Taken Time  BP 179/88 01/14/21 1525  Temp 36.5 C 01/14/21 1515  Pulse 95 01/14/21 1528  Resp 12 01/14/21 1528  SpO2 99 % 01/14/21 1528  Vitals shown include unvalidated device data.  Last Pain:  Vitals:   01/14/21 1515  TempSrc:   PainSc: 6       Patients Stated Pain Goal: 5 ([id number])  Complications: No notable events documented.

## 2021-01-14 NOTE — Anesthesia Preprocedure Evaluation (Addendum)
Anesthesia Evaluation  Patient identified by MRN, date of birth, ID band Patient awake    Reviewed: Allergy & Precautions, H&P , NPO status , Patient's Chart, lab work & pertinent test results  Airway Mallampati: III  TM Distance: >3 FB Neck ROM: Full    Dental no notable dental hx. (+) Teeth Intact, Dental Advisory Given   Pulmonary neg pulmonary ROS,    Pulmonary exam normal breath sounds clear to auscultation       Cardiovascular negative cardio ROS   Rhythm:Regular Rate:Normal     Neuro/Psych  Headaches, Anxiety Depression    GI/Hepatic Neg liver ROS, hiatal hernia, GERD  ,  Endo/Other  Hypothyroidism   Renal/GU negative Renal ROS  negative genitourinary   Musculoskeletal  (+) Arthritis , Osteoarthritis,  Fibromyalgia -  Abdominal   Peds  Hematology  (+) Blood dyscrasia, anemia ,   Anesthesia Other Findings   Reproductive/Obstetrics negative OB ROS                            Anesthesia Physical Anesthesia Plan  ASA: 2  Anesthesia Plan: General   Post-op Pain Management:    Induction: Intravenous, Rapid sequence and Cricoid pressure planned  PONV Risk Score and Plan: 4 or greater and Ondansetron, Dexamethasone and Treatment may vary due to age or medical condition  Airway Management Planned: Oral ETT  Additional Equipment:   Intra-op Plan:   Post-operative Plan: Extubation in OR  Informed Consent: I have reviewed the patients History and Physical, chart, labs and discussed the procedure including the risks, benefits and alternatives for the proposed anesthesia with the patient or authorized representative who has indicated his/her understanding and acceptance.     Dental advisory given  Plan Discussed with: CRNA  Anesthesia Plan Comments:         Anesthesia Quick Evaluation

## 2021-01-14 NOTE — Progress Notes (Signed)
PROGRESS NOTE    Laura Barnes  [id number] DOB: [birth year] DOA: 01/11/2021 PCP: Binnie Rail, MD  Brief Narrative: 80 year old female admitted with nausea vomiting constipation abdominal distention found to have small bowel obstruction.  Surgery has been consulted patient had NG tube placed, NG tube was taken out 01/13/2021.  She has a past medical history significant for NSAID induced GI bleeding and anemia anxiety fibromyalgia depression hypothyroidism migraine hyperlipidemia grade 1 diastolic dysfunction IBS chronic pain syndrome and multiple drug allergies and intolerances.   Assessment & Plan:   Active Problems:   SBO (small bowel obstruction) (HCC)   Small bowel obstruction (HCC)   Encounter for imaging study to confirm nasogastric (NG) tube placement   Encounter for imaging study to confirm nasogastric tube placement   #1 small bowel obstruction -NG tube DC'd 01/13/2021 by general surgery.     CT of the abdomen shows small bowel obstruction originating within the distal jejunum with transition zone noted within the left lower quadrant prior cholecystectomy hysterectomy and small hiatal hernia. KUB 01/14/2021 pending. Leukocytosis improving.  Encourage patient to ambulate out of bed as much as possible. Appreciate surgery input. Leukocytosis -likely reactive no signs of active infection noted so far.  UA does not appear to have UTI. Change IV fluids to NS at 75 cc an hour and DC D5.  #2 hypokalemia and hyponatremia-potassium 3.3 she was getting potassium and IV fluids which were stopped yesterday due to high potassium of 5.4.  Will replete potassium.  Mag yesterday was 2.1.  Hyponatremia resolved after switching fluids to normal saline.  Follow-up labs in AM.  #3 history of hypertension on as needed hydralazine takes Maxide at home.  Blood pressure 157/78.  #4 hypothyroidism on Synthroid IV.    #5 diastolic CHF stable monitor closely with IV fluids.  #6 anxiety and  depression restart home meds when able to take p.o.  #7 GERD continue PPI  #8 fibromyalgia continue supportive treatment  #9 anemia chronic follow closely  Estimated body mass index is 25.23 kg/m as calculated from the following:   Height as of this encounter: 4' 11.25" (1.505 m).   Weight as of this encounter: 57.2 kg.  DVT prophylaxis: lovenox Code Status: full Family Communication: none at bed side  Disposition Plan:  Status is: Inpatient  Remains inpatient appropriate because:Persistent severe electrolyte disturbances  Dispo: The patient is from: Home              Anticipated d/c is to: Home              Patient currently is not medically stable to d/c.   Difficult to place patient No    Consultants: surgery  Procedures: ngt Antimicrobials none   Subjective: Patient reports that she is feeling sick and miserable.  She is on Zofran and Phenergan for nausea.  She is complaining of nausea and vomited x1 overnight.  Her belly is distended.  She is not passing any gas or having any bowels. She tells me how can she have a bowel movement when she is not eating.  She has not done much of walking yesterday.  She tells me how can I walk when I am sick. Foley was taken out 01/13/2021.  Objective: Vitals:   01/13/21 2218 01/14/21 0209 01/14/21 0546 01/14/21 0925  BP: (!) 178/88 (!) 162/82 (!) 149/74 (!) 157/78  Pulse: (!) 109 (!) 102 89 84  Resp: '16 19 17 16  '$ Temp: 99.3 F (37.4 C) (!) 97.5  F (36.4 C) 98.2 F (36.8 C) 98.6 F (37 C)  TempSrc: Oral Oral Oral Oral  SpO2: 96% 96% 97% 96%  Weight:      Height:        Intake/Output Summary (Last 24 hours) at 01/14/2021 0958 Last data filed at 01/14/2021 0600 Gross per 24 hour  Intake 2783.58 ml  Output 2450 ml  Net 333.58 ml    Filed Weights   01/11/21 1132  Weight: 57.2 kg    Examination:  General exam: Appears in distress due to nausea vomiting abdominal distention and discomfort. Respiratory system: Clear to  auscultation. Respiratory effort normal. Cardiovascular system: S1 & S2 heard, RRR. No JVD, murmurs, rubs, gallops or clicks. No pedal edema. Gastrointestinal system: Abdomen is distended, soft and mild tender. No organomegaly or masses felt.  Diminished bowel sounds heard. Central nervous system: Alert and oriented. No focal neurological deficits. Extremities: Symmetric 5 x 5 power. Skin: No rashes, lesions or ulcers Psychiatry: Judgement and insight appear normal. Mood & affect appropriate.     Data Reviewed: I have personally reviewed following labs and imaging studies  CBC: Recent Labs  Lab 01/11/21 0321 01/12/21 0438 01/13/21 0358 01/14/21 0817  WBC 16.7* 21.0* 21.0* 17.4*  HGB 14.5 14.9 12.6 12.2  HCT 40.6 42.2 38.0 35.7*  MCV 96.0 97.5 103.3* 101.1*  PLT 289 296 257 [id number]    Basic Metabolic Panel: Recent Labs  Lab 01/11/21 0321 01/11/21 1340 01/12/21 0438 01/13/21 0358 01/14/21 0817  NA 128* 125* 127* 138 137  K 3.1* 4.2 4.8 5.4* 3.3*  CL 89* 90* 95* 108 103  CO2 '23 25 26 27 28  '$ GLUCOSE 182* 138* 178* 155* 126*  BUN 8 7* 8 7* 7*  CREATININE 0.47 0.44 0.46 0.54 0.37*  CALCIUM 10.0 8.4* 8.3* 8.2* 8.2*  MG  --  1.6*  --  2.1  --   PHOS  --  2.6  --   --   --     GFR: Estimated Creatinine Clearance: 43.6 mL/min (A) (by C-G formula based on SCr of 0.37 mg/dL (L)). Liver Function Tests: Recent Labs  Lab 01/11/21 0321 01/12/21 0438 01/13/21 0358 01/14/21 0817  AST '29 22 19 20  '$ ALT '22 17 14 15  '$ ALKPHOS 40 33* 37* 47  BILITOT 0.8 0.9 0.7 0.9  PROT 8.0 6.3* 5.6* 5.7*  ALBUMIN 4.8 3.2* 2.8* 2.7*    Recent Labs  Lab 01/11/21 0321  LIPASE 21    No results for input(s): AMMONIA in the last 168 hours. Coagulation Profile: No results for input(s): INR, PROTIME in the last 168 hours. Cardiac Enzymes: No results for input(s): CKTOTAL, CKMB, CKMBINDEX, TROPONINI in the last 168 hours. BNP (last 3 results) No results for input(s): PROBNP in the last 8760  hours. HbA1C: No results for input(s): HGBA1C in the last 72 hours.  CBG: Recent Labs  Lab 01/13/21 0613 01/13/21 1113 01/13/21 1820 01/14/21 0017 01/14/21 0610  GLUCAP 144* 140* 146* 120* 128*    Lipid Profile: No results for input(s): CHOL, HDL, LDLCALC, TRIG, CHOLHDL, LDLDIRECT in the last 72 hours. Thyroid Function Tests: Recent Labs    01/11/21 1340  TSH 0.996    Anemia Panel: No results for input(s): VITAMINB12, FOLATE, FERRITIN, TIBC, IRON, RETICCTPCT in the last 72 hours. Sepsis Labs: Recent Labs  Lab 01/11/21 1340  LATICACIDVEN 1.3     Recent Results (from the past 240 hour(s))  Resp Panel by RT-PCR (Flu A&B, Covid) Nasopharyngeal Swab  Status: None   Collection Time: 01/11/21  5:47 AM   Specimen: Nasopharyngeal Swab; Nasopharyngeal(NP) swabs in vial transport medium  Result Value Ref Range Status   SARS Coronavirus 2 by RT PCR NEGATIVE NEGATIVE Final    Comment: (NOTE) SARS-CoV-2 target nucleic acids are NOT DETECTED.  The SARS-CoV-2 RNA is generally detectable in upper respiratory specimens during the acute phase of infection. The lowest concentration of SARS-CoV-2 viral copies this assay can detect is 138 copies/mL. A negative result does not preclude SARS-Cov-2 infection and should not be used as the sole basis for treatment or other patient management decisions. A negative result may occur with  improper specimen collection/handling, submission of specimen other than nasopharyngeal swab, presence of viral mutation(s) within the areas targeted by this assay, and inadequate number of viral copies(<138 copies/mL). A negative result must be combined with clinical observations, patient history, and epidemiological information. The expected result is Negative.  Fact Sheet for Patients:  EntrepreneurPulse.com.au  Fact Sheet for Healthcare Providers:  IncredibleEmployment.be  This test is no t yet approved or  cleared by the [place] FDA and  has been authorized for detection and/or diagnosis of SARS-CoV-2 by FDA under an Emergency Use Authorization (EUA). This EUA will remain  in effect (meaning this test can be used) for the duration of the COVID-19 declaration under Section 564(b)(1) of the Act, 21 U.S.C.section 360bbb-3(b)(1), unless the authorization is terminated  or revoked sooner.       Influenza A by PCR NEGATIVE NEGATIVE Final   Influenza B by PCR NEGATIVE NEGATIVE Final    Comment: (NOTE) The Xpert Xpress SARS-CoV-2/FLU/RSV plus assay is intended as an aid in the diagnosis of influenza from Nasopharyngeal swab specimens and should not be used as a sole basis for treatment. Nasal washings and aspirates are unacceptable for Xpert Xpress SARS-CoV-2/FLU/RSV testing.  Fact Sheet for Patients: EntrepreneurPulse.com.au  Fact Sheet for Healthcare Providers: IncredibleEmployment.be  This test is not yet approved or cleared by the [place] FDA and has been authorized for detection and/or diagnosis of SARS-CoV-2 by FDA under an Emergency Use Authorization (EUA). This EUA will remain in effect (meaning this test can be used) for the duration of the COVID-19 declaration under Section 564(b)(1) of the Act, 21 U.S.C. section 360bbb-3(b)(1), unless the authorization is terminated or revoked.  Performed at [facility], [place] [address] [place], [place] [address]           Radiology Studies: DG Abd Portable 1V  Result Date: 01/13/2021 CLINICAL DATA:  Nasogastric tube placement, history of small-bowel obstruction EXAM: PORTABLE ABDOMEN - 1 VIEW COMPARISON:  Previous day FINDINGS: The upper abdomen is included in the field of view. The visualized lung fields are poorly evaluated due to technique. Interval advancement of the enteric tube such that the tip overlies the gastric body. Normal bowel gas pattern. Surgical clips  in the right upper quadrant. Small residual contrast material within a loop of bowel in the left upper quadrant. The osseous structures are unremarkable. IMPRESSION: Interval advancement of enteric tube with the tip overlying the gastric body. Electronically Signed   By: Albin Felling M.D.   On: 01/13/2021 08:19        Scheduled Meds:  Chlorhexidine Gluconate Cloth  6 each Topical Daily   enoxaparin (LOVENOX) injection  40 mg Subcutaneous Q24H   [START ON 01/15/2021] levothyroxine  25 mcg Intravenous Daily   pantoprazole (PROTONIX) IV  40 mg Intravenous Q24H   Continuous Infusions:  sodium chloride  75 mL/hr at 01/13/21 2046   promethazine (PHENERGAN) injection (IM or IVPB) 12.5 mg (01/14/21 0507)     LOS: 3 days    Time spent: 40 min  Georgette Shell, MD 01/14/2021, 9:58 AM

## 2021-01-15 ENCOUNTER — Inpatient Hospital Stay (HOSPITAL_COMMUNITY): Payer: Medicare Other

## 2021-01-15 ENCOUNTER — Encounter (HOSPITAL_COMMUNITY): Payer: Self-pay | Admitting: Surgery

## 2021-01-15 DIAGNOSIS — K562 Volvulus: Secondary | ICD-10-CM | POA: Diagnosis not present

## 2021-01-15 LAB — COMPREHENSIVE METABOLIC PANEL
ALT: 13 U/L (ref 0–44)
AST: 16 U/L (ref 15–41)
Albumin: 2.8 g/dL — ABNORMAL LOW (ref 3.5–5.0)
Alkaline Phosphatase: 35 U/L — ABNORMAL LOW (ref 38–126)
Anion gap: 12 (ref 5–15)
BUN: 11 mg/dL (ref 8–23)
CO2: 23 mmol/L (ref 22–32)
Calcium: 8 mg/dL — ABNORMAL LOW (ref 8.9–10.3)
Chloride: 102 mmol/L (ref 98–111)
Creatinine, Ser: 0.56 mg/dL (ref 0.44–1.00)
GFR, Estimated: >60 mL/min (ref >60)
Glucose, Bld: 139 mg/dL — ABNORMAL HIGH (ref 70–99)
Potassium: 3.4 mmol/L — ABNORMAL LOW (ref 3.5–5.1)
Sodium: 137 mmol/L (ref 135–145)
Total Bilirubin: 1.2 mg/dL (ref 0.3–1.2)
Total Protein: 5.4 g/dL — ABNORMAL LOW (ref 6.5–8.1)

## 2021-01-15 LAB — CBC
HCT: 35.3 % — ABNORMAL LOW (ref 36.0–46.0)
Hemoglobin: 12 g/dL (ref 12.0–15.0)
MCH: 34.1 pg — ABNORMAL HIGH (ref 26.0–34.0)
MCHC: 34 g/dL (ref 30.0–36.0)
MCV: 100.3 fL — ABNORMAL HIGH (ref 80.0–100.0)
Platelets: 272 10*3/uL (ref 150–400)
RBC: 3.52 MIL/uL — ABNORMAL LOW (ref 3.87–5.11)
RDW: 13 % (ref 11.5–15.5)
WBC: 13.4 10*3/uL — ABNORMAL HIGH (ref 4.0–10.5)
nRBC: 0 % (ref 0.0–0.2)

## 2021-01-15 LAB — GLUCOSE, CAPILLARY
Glucose-Capillary: 139 mg/dL — ABNORMAL HIGH (ref 70–99)
Glucose-Capillary: 127 mg/dL — ABNORMAL HIGH (ref 70–99)
Glucose-Capillary: 128 mg/dL — ABNORMAL HIGH (ref 70–99)

## 2021-01-15 LAB — PREALBUMIN: Prealbumin: 9 mg/dL — ABNORMAL LOW (ref 18–38)

## 2021-01-15 LAB — MAGNESIUM: Magnesium: 1.5 mg/dL — ABNORMAL LOW (ref 1.7–2.4)

## 2021-01-15 LAB — PHOSPHORUS: Phosphorus: 2.8 mg/dL (ref 2.5–4.6)

## 2021-01-15 MED ORDER — METOPROLOL TARTRATE 5 MG/5ML IV SOLN: 2.5000 mg | Freq: Four times a day (QID) | INTRAVENOUS | Status: DC | PRN

## 2021-01-15 NOTE — Progress Notes (Signed)
Mobility Specialist - Progress Note    01/15/21 1349  Mobility  Activity Ambulated in hall;Ambulated in room  Level of Assistance Minimal assist, patient does 75% or more  Assistive Device Front wheel walker  Distance Ambulated (ft) 20 ft  Mobility Ambulated with assistance in room;Ambulated with assistance in hallway  Mobility Response Tolerated well  Mobility performed by Mobility specialist  $Mobility charge 1 Mobility    Upon entry pt c/o of pain and feeling "gassy". Pt required extra time to sit EOB due to pain and feeling dehydrated. Log roll technique utilized to sit pt EOB. Once up, pt chewed on ice chips for hydration and did not report feeling dizzy or lightheaded. Pt required Min A to stand and used RW to help with stability. Verbal cues utilized to keep pt upright and to keep hands on walker. Pt encouraged to use bathroom, but did not sense an urge to urinate. Pt ambulated ~20 ft in hallway while pushing RW. Pt reported feeling fatigued and returned to bed. MD entered room to assess pt and may order PT. RN informed of session.   [place] Specialist [facility] Services Phone: [phone] 01/15/21, 1:54 PM

## 2021-01-15 NOTE — Progress Notes (Addendum)
Bladder scan shows 81 cc in bladder. Patient unable to void at this time. Pt stated she takes medication at home in order to urinate. Foley was removed at [place].   MD paged and orders placed to increase fluids to 125/hr. Bladder scan q3hrs.  If patient is unable to void, catheter will need to be replaced.

## 2021-01-15 NOTE — Anesthesia Postprocedure Evaluation (Signed)
Anesthesia Post Note  Patient: Shanvika Dickerson Stober  Procedure(s) Performed: LAPAROSCOPY DIAGNOSTIC, LYSIS OF ADHESIONS, laparoscopic assisted open small bowel resection. (Abdomen)     Patient location during evaluation: Other Anesthesia Type: General Level of consciousness: awake and alert Pain management: pain level controlled Vital Signs Assessment: post-procedure vital signs reviewed and stable Respiratory status: spontaneous breathing, nonlabored ventilation, respiratory function stable and patient connected to nasal cannula oxygen Cardiovascular status: blood pressure returned to baseline and stable Postop Assessment: no apparent nausea or vomiting Anesthetic complications: no   No notable events documented.  Last Vitals:  Vitals:   01/15/21 0444 01/15/21 0951  BP: 140/70 (!) 150/67  Pulse: (!) 118 (!) 117  Resp: 18 16  Temp: (!) 36.3 C 36.8 C  SpO2: 94% 94%    Last Pain:  Vitals:   01/15/21 0951  TempSrc: Oral  PainSc:                  Cherolyn Behrle,W. EDMOND

## 2021-01-15 NOTE — Progress Notes (Signed)
Laura Barnes [id number] 11/20/1940  CARE TEAM:  PCP: Binnie Rail, MD  Outpatient Care Team: Patient Care Team: Binnie Rail, MD as PCP - General (Internal Medicine) Bo Merino, MD as Consulting Physician (Rheumatology) Dorothy Spark, MD (Inactive) as Consulting Physician (Cardiology) [name], MD as Consulting Physician (Otolaryngology) Lyndal Pulley, DO as Consulting Physician (Family Medicine) Brand Males, MD as Consulting Physician (Pulmonary Disease) Magnus Sinning, MD as Consulting Physician (Physical Medicine and Rehabilitation) Ashok Pall, MD as Consulting Physician (Neurosurgery) Charlton Haws, [facility] as Pharmacist (Pharmacist)  Inpatient Treatment Team: Treatment Team: Attending Provider: Nita Sells, MD; Consulting Physician: Edison Pace Md, MD; Pharmacist: Eudelia Bunch, [facility]; Rounding Team: Fatima Blank, MD; Charge Nurse: Charlyne Petrin, RN; Pharmacist: Napoleon Form, [facility]; Utilization Review: Micah Noel, RN   Problem List:   Active Problems:   SBO (small bowel obstruction) ([place])   Small bowel obstruction ([place])   Encounter for imaging study to confirm nasogastric (NG) tube placement   Encounter for imaging study to confirm nasogastric tube placement   1 Day Post-Op  01/14/2021  POST-OPERATIVE DIAGNOSIS:  CLOSED LOOP STRANGULATED SMALL BOWEL OBSTRUCTION    PROCEDURE:   LAPAROSCOPIC LYSIS OF ADHESIONS SMALL BOWEL RESECTION COLON SEROSAL REPAIR   SURGEON:  Adin Hector, MD, FACS   ASSISTANT: Romana Juniper, MD, FACS  OR FINDINGS:  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  Obvious transition zone point.  Significant ischemia and necrosis.  No perforation.  Resection done with anastomosis.  No other interloop adhesions.  Moderate adhesions around floppy cecal bascule freed off.  Small serosal repair done.  Omentum freed off  to allow to be placed down into the pelvis.   CASE DATA:   Type of patient?: LDOW CASE (Surgical Hospitalist [facility] Inpatient)   Status of Case? 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   I have reviewed this patient's available data, including medical history, events of note, physical examination and test results as part of my evaluation.  A significant portion of that time was spent in counseling.  Care during the described time interval was provided by me.  01/15/2021    Subjective: (Chief complaint)  Mild soreness Sat up in chair much of night  Objective:  Vital signs:  Vitals:   01/14/21 1834 01/14/21 2201 01/15/21 0142 01/15/21 0444  BP: (!) 153/73 (!) 154/75 (!) 149/72 140/70  Pulse: (!) 102 (!) 109 (!) 109 (!) 118  Resp: '16 18 18 18  '$ Temp: 97.7 F (36.5 C) 98.6 F (37 C)  (!) 97.4 F (36.3 C)  TempSrc: Oral Oral  Oral  SpO2: 97% 96% 94% 94%  Weight:      Height:        Last BM Date:  (patient cannot recall)  Intake/Output   Yesterday:  08/16 0701 - 08/17 0700 In: 4717.7 [P.O.:60; I.V.:3807.7; IV Piggyback:850] Out: 2900 [Urine:1100; Emesis/NG output:500; Blood:200] This shift:  Total I/O In: 90 [P.O.:90] Out: -  Bowel function:  Flatus: No  BM:  No  Drain:  NGT - thinly bilious   Physical Exam:  General: Pt awake/alert in no acute distress Eyes: PERRL, normal EOM.  Sclera clear.  No icterus Neuro: CN II-XII intact w/o focal sensory/motor deficits. Lymph: No head/neck/groin lymphadenopathy Psych:  No  delerium/psychosis/paranoia.  Oriented x 4 HENT: Normocephalic, Mucus membranes moist.  No thrush Neck: Supple, No tracheal deviation.  No obvious thyromegaly Chest: No pain to chest wall compression.  Good respiratory excursion.  No audible wheezing CV:  Pulses intact.  Regular rhythm.  No major extremity edema MS: Normal AROM mjr joints.  No obvious deformity  Abdomen: Soft.  Mildy distended.  Mildly tender at incisions only.  No evidence of peritonitis.  No incarcerated hernias.  Ext:   No deformity.  No mjr edema.  No cyanosis Skin: No petechiae / purpurea.  No major sores.  Warm and dry    Results:   Cultures: Recent Results (from the past 720 hour(s))  Resp Panel by RT-PCR (Flu A&B, Covid) Nasopharyngeal Swab     Status: None   Collection Time: 01/11/21  5:47 AM   Specimen: Nasopharyngeal Swab; Nasopharyngeal(NP) swabs in vial transport medium  Result Value Ref Range Status   SARS Coronavirus 2 by RT PCR NEGATIVE NEGATIVE Final    Comment: (NOTE) SARS-CoV-2 target nucleic acids are NOT DETECTED.  The SARS-CoV-2 RNA is generally detectable in upper respiratory specimens during the acute phase of infection. The lowest concentration of SARS-CoV-2 viral copies this assay can detect is 138 copies/mL. A negative result does not preclude SARS-Cov-2 infection and should not be used as the sole basis for treatment or other patient management decisions. A negative result may occur with  improper specimen collection/handling, submission of specimen other than nasopharyngeal swab, presence of viral mutation(s) within the areas targeted by this assay, and inadequate number of viral copies(<138 copies/mL). A negative result must be combined with clinical observations, patient history, and epidemiological information. The expected result is Negative.  Fact Sheet for Patients:  EntrepreneurPulse.com.au  Fact Sheet for Healthcare Providers:   IncredibleEmployment.be  This test is no t yet approved or cleared by the [place] FDA and  has been authorized for detection and/or diagnosis of SARS-CoV-2 by FDA under an Emergency Use Authorization (EUA). This EUA will remain  in effect (meaning this test can be used) for the duration of the COVID-19 declaration under Section 564(b)(1) of the Act, 21 U.S.C.section 360bbb-3(b)(1), unless the authorization is terminated  or revoked sooner.       Influenza A by PCR NEGATIVE NEGATIVE Final   Influenza B by PCR NEGATIVE NEGATIVE Final    Comment: (NOTE) The Xpert Xpress SARS-CoV-2/FLU/RSV plus assay is intended as an aid in the diagnosis of influenza from Nasopharyngeal swab specimens and should not be used as a sole basis for treatment. Nasal washings and aspirates are unacceptable for Xpert Xpress SARS-CoV-2/FLU/RSV testing.  Fact Sheet for Patients: EntrepreneurPulse.com.au  Fact Sheet for Healthcare Providers: IncredibleEmployment.be  This test is not yet approved or cleared by the [place] FDA and has been authorized for detection and/or diagnosis of SARS-CoV-2 by FDA under an Emergency Use Authorization (EUA). This EUA will remain in effect (meaning this test can be used) for the duration of the COVID-19 declaration under Section 564(b)(1) of the Act, 21 U.S.C. section 360bbb-3(b)(1), unless the authorization is terminated or revoked.  Performed at [facility], [place] [address], [place], [place] [address]  Labs: Results for orders placed or performed during the hospital encounter of 01/11/21 (from the past 48 hour(s))  Glucose, capillary     Status: Abnormal   Collection Time: 01/13/21 11:13 AM  Result Value Ref Range   Glucose-Capillary 140 (H) 70 - 99 mg/dL    Comment: Glucose reference range applies only to samples taken after fasting for at least 8 hours.  Glucose, capillary      Status: Abnormal   Collection Time: 01/13/21  6:20 PM  Result Value Ref Range   Glucose-Capillary 146 (H) 70 - 99 mg/dL    Comment: Glucose reference range applies only to samples taken after fasting for at least 8 hours.  Glucose, capillary     Status: Abnormal   Collection Time: 01/14/21 12:17 AM  Result Value Ref Range   Glucose-Capillary 120 (H) 70 - 99 mg/dL    Comment: Glucose reference range applies only to samples taken after fasting for at least 8 hours.  Glucose, capillary     Status: Abnormal   Collection Time: 01/14/21  6:10 AM  Result Value Ref Range   Glucose-Capillary 128 (H) 70 - 99 mg/dL    Comment: Glucose reference range applies only to samples taken after fasting for at least 8 hours.  CBC     Status: Abnormal   Collection Time: 01/14/21  8:17 AM  Result Value Ref Range   WBC 17.4 (H) 4.0 - 10.5 K/uL   RBC 3.53 (L) 3.87 - 5.11 MIL/uL   Hemoglobin 12.2 12.0 - 15.0 g/dL   HCT 35.7 (L) 36.0 - 46.0 %   MCV 101.1 (H) 80.0 - 100.0 fL   MCH 34.6 (H) 26.0 - 34.0 pg   MCHC 34.2 30.0 - 36.0 g/dL   RDW 13.2 11.5 - 15.5 %   Platelets 263 150 - 400 K/uL   nRBC 0.0 0.0 - 0.2 %    Comment: Performed at [facility], [place] [address], [place], [place] [address]  Comprehensive metabolic panel     Status: Abnormal   Collection Time: 01/14/21  8:17 AM  Result Value Ref Range   Sodium 137 135 - 145 mmol/L   Potassium 3.3 (L) 3.5 - 5.1 mmol/L    Comment: DELTA CHECK NOTED   Chloride 103 98 - 111 mmol/L   CO2 28 22 - 32 mmol/L   Glucose, Bld 126 (H) 70 - 99 mg/dL    Comment: Glucose reference range applies only to samples taken after fasting for at least 8 hours.   BUN 7 (L) 8 - 23 mg/dL   Creatinine, Ser 0.37 (L) 0.44 - 1.00 mg/dL   Calcium 8.2 (L) 8.9 - 10.3 mg/dL   Total Protein 5.7 (L) 6.5 - 8.1 g/dL   Albumin 2.7 (L) 3.5 - 5.0 g/dL   AST 20 15 - 41 U/L   ALT 15 0 - 44 U/L   Alkaline Phosphatase 47 38 - 126 U/L   Total Bilirubin 0.9 0.3 - 1.2 mg/dL    GFR, Estimated >60 >60 mL/min    Comment: (NOTE) Calculated using the CKD-EPI Creatinine Equation (2021)    Anion gap 6 5 - 15    Comment: Performed at [facility] [place] [address] [place], [place] [address]  Glucose, capillary     Status: Abnormal   Collection Time: 01/14/21  5:32 PM  Result Value Ref Range   Glucose-Capillary 142 (H) 70 - 99 mg/dL    Comment: Glucose reference range applies only to  samples taken after fasting for at least 8 hours.  Glucose, capillary     Status: Abnormal   Collection Time: 01/14/21 11:42 PM  Result Value Ref Range   Glucose-Capillary 116 (H) 70 - 99 mg/dL    Comment: Glucose reference range applies only to samples taken after fasting for at least 8 hours.  CBC     Status: Abnormal   Collection Time: 01/15/21  4:34 AM  Result Value Ref Range   WBC 13.4 (H) 4.0 - 10.5 K/uL   RBC 3.52 (L) 3.87 - 5.11 MIL/uL   Hemoglobin 12.0 12.0 - 15.0 g/dL   HCT 35.3 (L) 36.0 - 46.0 %   MCV 100.3 (H) 80.0 - 100.0 fL   MCH 34.1 (H) 26.0 - 34.0 pg   MCHC 34.0 30.0 - 36.0 g/dL   RDW 13.0 11.5 - 15.5 %   Platelets 272 150 - 400 K/uL   nRBC 0.0 0.0 - 0.2 %    Comment: Performed at [facility], [place] [address] [place], [place] [address]  Comprehensive metabolic panel     Status: Abnormal   Collection Time: 01/15/21  4:34 AM  Result Value Ref Range   Sodium 137 135 - 145 mmol/L   Potassium 3.4 (L) 3.5 - 5.1 mmol/L   Chloride 102 98 - 111 mmol/L   CO2 23 22 - 32 mmol/L   Glucose, Bld 139 (H) 70 - 99 mg/dL    Comment: Glucose reference range applies only to samples taken after fasting for at least 8 hours.   BUN 11 8 - 23 mg/dL   Creatinine, Ser 0.56 0.44 - 1.00 mg/dL   Calcium 8.0 (L) 8.9 - 10.3 mg/dL   Total Protein 5.4 (L) 6.5 - 8.1 g/dL   Albumin 2.8 (L) 3.5 - 5.0 g/dL   AST 16 15 - 41 U/L   ALT 13 0 - 44 U/L   Alkaline Phosphatase 35 (L) 38 - 126 U/L   Total Bilirubin 1.2 0.3 - 1.2 mg/dL   GFR, Estimated >60 >60  mL/min    Comment: (NOTE) Calculated using the CKD-EPI Creatinine Equation (2021)    Anion gap 12 5 - 15    Comment: Performed at [facility], [place] [address], [place], [place] [address]  Magnesium     Status: Abnormal   Collection Time: 01/15/21  4:34 AM  Result Value Ref Range   Magnesium 1.5 (L) 1.7 - 2.4 mg/dL    Comment: Performed at [facility], [place] [address] [place], [place] [address]  Prealbumin     Status: Abnormal   Collection Time: 01/15/21  4:34 AM  Result Value Ref Range   Prealbumin 9.0 (L) 18 - 38 mg/dL    Comment: Performed at [facility], [place] [address] [place], [place] [address]  Phosphorus     Status: None   Collection Time: 01/15/21  4:34 AM  Result Value Ref Range   Phosphorus 2.8 2.5 - 4.6 mg/dL    Comment: Performed at [facility], [place] [address] [place], [place] [address]  Glucose, capillary     Status: Abnormal   Collection Time: 01/15/21  5:53 AM  Result Value Ref Range   Glucose-Capillary 139 (H) 70 - 99 mg/dL    Comment: Glucose reference range applies only to samples taken after fasting for at least 8 hours.    Imaging / Studies: DG Abd 1 View  Result Date: 01/14/2021 CLINICAL DATA:  Generalized  abdominal pain and distention. EXAM: ABDOMEN - 1 VIEW COMPARISON:  January 13, 2021. FINDINGS: The bowel gas pattern is normal. No radio-opaque calculi or other significant radiographic abnormality are seen. IMPRESSION: Negative. Electronically Signed   By: Marijo Conception M.D.   On: 01/14/2021 12:40   DG Abd Portable 1V  Result Date: 01/13/2021 CLINICAL DATA:  Nasogastric tube placement, history of small-bowel obstruction EXAM: PORTABLE ABDOMEN - 1 VIEW COMPARISON:  Previous day FINDINGS: The upper abdomen is included in the field of view. The visualized lung fields are poorly evaluated due to technique. Interval advancement of the enteric tube such that the tip overlies the  gastric body. Normal bowel gas pattern. Surgical clips in the right upper quadrant. Small residual contrast material within a loop of bowel in the left upper quadrant. The osseous structures are unremarkable. IMPRESSION: Interval advancement of enteric tube with the tip overlying the gastric body. Electronically Signed   By: Albin Felling M.D.   On: 01/13/2021 08:19    Medications / Allergies: per chart  Antibiotics: Anti-infectives (From admission, onward)    Start     Dose/Rate Route Frequency Ordered Stop   01/15/21 0600  cefoTEtan (CEFOTAN) 2 g in sodium chloride 0.9 % 100 mL IVPB  Status:  Discontinued       Note to Pharmacy: Pharmacy may adjust dose strength for optimal dosing.   Send with patient on call to the OR.  Anesthesia to complete antibiotic administration <59mn prior to incision [facility]   2 g 200 mL/hr over 30 Minutes Intravenous On call to O.R. 01/14/21 1216 01/14/21 1219   01/14/21 2200  cefoTEtan (CEFOTAN) 2 g in sodium chloride 0.9 % 100 mL IVPB        2 g 200 mL/hr over 30 Minutes Intravenous Every 12 hours 01/14/21 1632 01/14/21 2149   01/14/21 1215  cefoTEtan (CEFOTAN) 2 g in sodium chloride 0.9 % 100 mL IVPB        2 g 200 mL/hr over 30 Minutes Intravenous On call to O.R. 01/14/21 1115 01/14/21 1353         Note: Portions of this report may have been transcribed using voice recognition software. Every effort was made to ensure accuracy; however, inadvertent computerized transcription errors may be present.   Any transcriptional errors that result from this process are unintentional.    SAdin Hector MD, FACS, MASCRS Esophageal, Gastrointestinal & Colorectal Surgery Robotic and Minimally Invasive Surgery  [facility] [place] [address] [address] [place] [phone] [phone]  CONTACT INFORMATION:  Weekday (9AM-5PM): Call [facility] main office at  [phone] Weeknight (5PM-9AM) or Weekend/Holiday: Check [email or web address] (password " [email or web address]") for General Surgery [facility] coverage  (Please, do not use SecureChat as it is not reliable communication to operating surgeons for immediate patient care)      01/15/2021  8:03 AM

## 2021-01-15 NOTE — Progress Notes (Signed)
PROGRESS NOTE   Laura Barnes  [id number] DOB: Jun 10, 1940 DOA: 01/11/2021 PCP: Binnie Rail, MD  Brief Narrative:  80 year old white female Known history of anxiety hypothyroid HTN Allergies NSAID induced GI bleed fibromyalgia grade 1 diastolic dysfunction IBS admit 01/11/2021 generalized abdominal pain-found to have small bowel obstruction on arrival Manage conservatively until 8/16 then underwent laparoscopic lysis of adhesions and small bowel resection with colon serosal repair  Hospital-Problem based course  Small bowel obstruction status post laparoscopic repair Postop management per general surgery Continue LR 75 cc/h HTN, sinus tach Heart rate slightly uncontrolled-discontinue Vasotec-start metoprolol IV Reevaluate and adjust as needed HFpEF [id number] grade 1 diastolic dysfunction [id number] Maxide on hold Hypothyroid Replacing Synthroid 25 mcg IV Fibromyalgia/depression Resume Xanax 0.5 3 times daily hydroxyzine 25 3 times daily when able   DVT prophylaxis: Lovenox Code Status: Full Family Communication:  Disposition:  Status is: Inpatient  Remains inpatient appropriate because:Hemodynamically unstable  Dispo: The patient is from: Home              Anticipated d/c is to: Home              Patient currently is not medically stable to d/c.   Difficult to place patient No       Consultants:  General surgery  Procedures: Laparoscopy 8/16  Antimicrobials: Perioperative   Subjective: Coherent pleasant no distress ambulated with difficulty this morning--tells me he has a ramp at home Pain seems moderately controlled Passing mild flatus does feel distended Tolerating sips   Objective: Vitals:   01/14/21 2201 01/15/21 0142 01/15/21 0444 01/15/21 0951  BP: (!) 154/75 (!) 149/72 140/70 (!) 150/67  Pulse: (!) 109 (!) 109 (!) 118 (!) 117  Resp: '18 18 18 16  '$ Temp: 98.6 F (37 C)  (!) 97.4 F (36.3 C) 98.3 F (36.8 C)  TempSrc: Oral  Oral Oral  SpO2:  96% 94% 94% 94%  Weight:      Height:        Intake/Output Summary (Last 24 hours) at 01/15/2021 1301 Last data filed at 01/15/2021 1000 Gross per 24 hour  Intake 4232.71 ml  Output 2450 ml  Net 1782.71 ml   Filed Weights   01/11/21 1132  Weight: 57.2 kg    Examination:  Awake alert coherent no distress EOMI NCAT no focal deficit CTA B no added sound no rales rhonchi Abdomen soft slightly tender and distended ROM intact S1-S2 no murmur  Data Reviewed: personally reviewed   CBC    Component Value Date/Time   WBC 13.4 (H) 01/15/2021 0434   RBC 3.52 (L) 01/15/2021 0434   HGB 12.0 01/15/2021 0434   HCT 35.3 (L) 01/15/2021 0434   PLT 272 01/15/2021 0434   MCV 100.3 (H) 01/15/2021 0434   MCH 34.1 (H) 01/15/2021 0434   MCHC 34.0 01/15/2021 0434   RDW 13.0 01/15/2021 0434   LYMPHSABS 2.0 03/29/2020 1515   MONOABS 0.9 03/29/2020 1515   EOSABS 0.0 03/29/2020 1515   BASOSABS 0.0 03/29/2020 1515   CMP Latest Ref Rng & Units 01/15/2021 01/14/2021 01/13/2021  Glucose 70 - 99 mg/dL 139(H) 126(H) 155(H)  BUN 8 - 23 mg/dL 11 7(L) 7(L)  Creatinine 0.44 - 1.00 mg/dL 0.56 0.37(L) 0.54  Sodium 135 - 145 mmol/L 137 137 138  Potassium 3.5 - 5.1 mmol/L 3.4(L) 3.3(L) 5.4(H)  Chloride 98 - 111 mmol/L 102 103 108  CO2 22 - 32 mmol/L '23 28 27  '$ Calcium 8.9 - 10.3 mg/dL 8.0(L) 8.2(L) 8.2(L)  Total Protein 6.5 - 8.1 g/dL 5.4(L) 5.7(L) 5.6(L)  Total Bilirubin 0.3 - 1.2 mg/dL 1.2 0.9 0.7  Alkaline Phos 38 - 126 U/L 35(L) 47 37(L)  AST 15 - 41 U/L '16 20 19  '$ ALT 0 - 44 U/L '13 15 14     '$ Radiology Studies: DG Abd 1 View  Result Date: 01/15/2021 CLINICAL DATA:  Abdominal distension EXAM: ABDOMEN - 1 VIEW COMPARISON:  None. FINDINGS: Nasogastric tube with the tip projecting over the antrum of the stomach. Relative paucity of bowel gas. Moderate amount of stool in the ascending colon. No evidence of pneumoperitoneum, portal venous gas or pneumatosis. No pathologic calcifications along the  expected course of the ureters. No acute osseous abnormality. Levoscoliosis of the lumbar spine. Lower lumbar spine spondylosis. IMPRESSION: Nasogastric tube with the tip projecting over the antrum of the stomach. Relative paucity of bowel gas. Electronically Signed   By: Kathreen Devoid M.D.   On: 01/15/2021 08:09   DG Abd 1 View  Result Date: 01/14/2021 CLINICAL DATA:  Generalized abdominal pain and distention. EXAM: ABDOMEN - 1 VIEW COMPARISON:  January 13, 2021. FINDINGS: The bowel gas pattern is normal. No radio-opaque calculi or other significant radiographic abnormality are seen. IMPRESSION: Negative. Electronically Signed   By: Marijo Conception M.D.   On: 01/14/2021 12:40     Scheduled Meds:  Chlorhexidine Gluconate Cloth  6 each Topical Daily   enoxaparin (LOVENOX) injection  40 mg Subcutaneous Q24H   levothyroxine  25 mcg Intravenous Daily   magic mouthwash  15 mL Oral QID   pantoprazole (PROTONIX) IV  40 mg Intravenous Q24H   Continuous Infusions:  sodium chloride 75 mL/hr at 01/14/21 1639   lactated ringers     methocarbamol (ROBAXIN) IV 1,000 mg (01/15/21 0526)   promethazine (PHENERGAN) injection (IM or IVPB) 12.5 mg (01/14/21 0507)     LOS: 4 days   Time spent: 69  Nita Sells, MD [facility] To contact the attending provider between 7A-7P or the covering provider during after hours 7P-7A, please log into the web site [email or web address] and access using universal [place] password for that web site. If you do not have the password, please call the hospital operator.  01/15/2021, 1:01 PM

## 2021-01-16 DIAGNOSIS — K562 Volvulus: Secondary | ICD-10-CM | POA: Diagnosis not present

## 2021-01-16 LAB — MAGNESIUM: Magnesium: 1.8 mg/dL (ref 1.7–2.4)

## 2021-01-16 LAB — GLUCOSE, CAPILLARY
Glucose-Capillary: 106 mg/dL — ABNORMAL HIGH (ref 70–99)
Glucose-Capillary: 114 mg/dL — ABNORMAL HIGH (ref 70–99)
Glucose-Capillary: 87 mg/dL (ref 70–99)
Glucose-Capillary: 95 mg/dL (ref 70–99)

## 2021-01-16 LAB — COMPREHENSIVE METABOLIC PANEL
ALT: 13 U/L (ref 0–44)
AST: 16 U/L (ref 15–41)
Albumin: 2.3 g/dL — ABNORMAL LOW (ref 3.5–5.0)
Alkaline Phosphatase: 45 U/L (ref 38–126)
Anion gap: 11 (ref 5–15)
BUN: 9 mg/dL (ref 8–23)
CO2: 26 mmol/L (ref 22–32)
Calcium: 8.1 mg/dL — ABNORMAL LOW (ref 8.9–10.3)
Chloride: 102 mmol/L (ref 98–111)
Creatinine, Ser: 0.48 mg/dL (ref 0.44–1.00)
GFR, Estimated: >60 mL/min (ref >60)
Glucose, Bld: 104 mg/dL — ABNORMAL HIGH (ref 70–99)
Potassium: 2.8 mmol/L — ABNORMAL LOW (ref 3.5–5.1)
Sodium: 139 mmol/L (ref 135–145)
Total Bilirubin: 0.7 mg/dL (ref 0.3–1.2)
Total Protein: 5.1 g/dL — ABNORMAL LOW (ref 6.5–8.1)

## 2021-01-16 LAB — CBC WITH DIFFERENTIAL/PLATELET
Abs Immature Granulocytes: 0.71 10*3/uL — ABNORMAL HIGH (ref 0.00–0.07)
Basophils Absolute: 0.1 10*3/uL (ref 0.0–0.1)
Basophils Relative: 1 %
Eosinophils Absolute: 0.1 10*3/uL (ref 0.0–0.5)
Eosinophils Relative: 1 %
HCT: 30.3 % — ABNORMAL LOW (ref 36.0–46.0)
Hemoglobin: 10.3 g/dL — ABNORMAL LOW (ref 12.0–15.0)
Immature Granulocytes: 5 %
Lymphocytes Relative: 12 %
Lymphs Abs: 1.6 10*3/uL (ref 0.7–4.0)
MCH: 34 pg (ref 26.0–34.0)
MCHC: 34 g/dL (ref 30.0–36.0)
MCV: 100 fL (ref 80.0–100.0)
Monocytes Absolute: 2 10*3/uL — ABNORMAL HIGH (ref 0.1–1.0)
Monocytes Relative: 14 %
Neutro Abs: 9.3 10*3/uL — ABNORMAL HIGH (ref 1.7–7.7)
Neutrophils Relative %: 67 %
Platelets: 250 10*3/uL (ref 150–400)
RBC: 3.03 MIL/uL — ABNORMAL LOW (ref 3.87–5.11)
RDW: 13.1 % (ref 11.5–15.5)
WBC: 13.9 10*3/uL — ABNORMAL HIGH (ref 4.0–10.5)
nRBC: 0 % (ref 0.0–0.2)

## 2021-01-16 LAB — PHOSPHORUS: Phosphorus: 1.7 mg/dL — ABNORMAL LOW (ref 2.5–4.6)

## 2021-01-16 LAB — SURGICAL PATHOLOGY

## 2021-01-16 MED ORDER — POTASSIUM CHLORIDE 2 MEQ/ML IV SOLN
INTRAVENOUS | Status: DC
  Filled 2021-01-16 (×6): qty 1000

## 2021-01-16 MED ORDER — METHOCARBAMOL 1000 MG/10ML IJ SOLN
500.0000 mg | Freq: Three times a day (TID) | INTRAVENOUS | Status: DC
  Administered 2021-01-16 – 2021-01-21 (×15): 500 mg via INTRAVENOUS
  Filled 2021-01-16 (×10): qty 500
  Filled 2021-01-16: qty 5
  Filled 2021-01-16 (×4): qty 500
  Filled 2021-01-16: qty 5

## 2021-01-16 MED ORDER — POTASSIUM CHLORIDE 10 MEQ/100ML IV SOLN
10.0000 meq | INTRAVENOUS | Status: AC
  Administered 2021-01-16 (×2): 10 meq via INTRAVENOUS
  Filled 2021-01-16 (×2): qty 100

## 2021-01-16 MED ORDER — HYDROMORPHONE HCL 1 MG/ML IJ SOLN
0.5000 mg | INTRAMUSCULAR | Status: DC | PRN
Start: 2021-01-16 — End: 2021-01-21
  Administered 2021-01-16 (×3): 1 mg via INTRAVENOUS
  Administered 2021-01-16 – 2021-01-17 (×2): 0.5 mg via INTRAVENOUS
  Administered 2021-01-17: 1 mg via INTRAVENOUS
  Administered 2021-01-17 (×3): 0.5 mg via INTRAVENOUS
  Administered 2021-01-18 – 2021-01-20 (×13): 1 mg via INTRAVENOUS
  Filled 2021-01-16: qty 1
  Filled 2021-01-16: qty 2
  Filled 2021-01-16 (×20): qty 1

## 2021-01-16 MED ORDER — METOPROLOL TARTRATE 5 MG/5ML IV SOLN: 5.0000 mg | Freq: Four times a day (QID) | INTRAVENOUS | Status: DC | PRN

## 2021-01-16 MED ORDER — MAGIC MOUTHWASH
5.0000 mL | Freq: Four times a day (QID) | ORAL | Status: DC
  Administered 2021-01-16 – 2021-01-20 (×14): 5 mL via ORAL
  Filled 2021-01-16 (×21): qty 5

## 2021-01-16 NOTE — Progress Notes (Signed)
Patient refused evening ambulation but got up to the bedside commode multiple time to urinate. No bowel movement as of yet. Patient has had some flatulence several times.Patient had scant residual to 13 mls doing checks. Canister is currently at 373ms and marked. Patient experienced nausea several time and PRN given. Patient still experience migraine and pain. Prn Given. MLegrand Comocame to visit and report given to him and writer called RLiliane Channel(oldest son ) to provide support, motivation and encouragement for their mother to get up and walk. Patient currently visiting with MLegrand Como

## 2021-01-16 NOTE — Progress Notes (Signed)
PROGRESS NOTE   Laura Barnes  [id number] DOB: April 19, 1941 DOA: 01/11/2021 PCP: Binnie Rail, MD  Brief Narrative:  80 year old white female Known history of anxiety hypothyroid HTN Allergies NSAID induced GI bleed fibromyalgia grade 1 diastolic dysfunction IBS admit 01/11/2021 generalized abdominal pain-found to have small bowel obstruction on arrival Manage conservatively until 8/16 then underwent laparoscopic lysis of adhesions and small bowel resection with colon serosal repair  NG tube placed and has prolonged ileus  Hospital-Problem based course  Small bowel obstruction status post laparoscopic repair Postop ileus Postop per general surgery--now on ice chips--dilaudid frequency increased LR 75 cc/h Leukocytosis stable 13 range no fever HTN, sinus tach Heart rate slightly uncontrolled-discontinue Vasotec-continue metoprolol IV Reevaluate and adjust as needed HFpEF [id number] grade 1 diastolic dysfunction [id number] Maxide on hold Hypokalemia 4 runs of potassium being given, replace also with LR +40 of K at 100 cc an hour and recheck a.m. labs Magnesium is up to 1.8 Hypothyroid Replacing Synthroid 25 mcg IV Fibromyalgia/depression Resume Xanax 0.5 3 times daily hydroxyzine 25 3 times daily when able   DVT prophylaxis: Lovenox Code Status: Full Family Communication: patient updated alone Disposition:  Status is: Inpatient  Remains inpatient appropriate because:Hemodynamically unstable  Dispo: The patient is from: Home              Anticipated d/c is to: Home              Patient currently is not medically stable to d/c.   Difficult to place patient No       Consultants:  General surgery  Procedures: Laparoscopy 8/16  Antimicrobials: Perioperative   Subjective:  Awake coherent in nad no focal deficit Eating ice chips No cp no fever no chills   Objective: Vitals:   01/15/21 1335 01/15/21 1735 01/15/21 2103 01/16/21 0442  BP: (!) 152/62 (!) 146/62  (!) 171/73 (!) 174/80  Pulse: (!) 111 (!) 117 100 (!) 108  Resp: '16 16 20 16  '$ Temp: (!) 97.5 F (36.4 C) 98.3 F (36.8 C) 99.7 F (37.6 C) 97.9 F (36.6 C)  TempSrc: Oral Oral Oral Oral  SpO2: 93% 98% 96% 97%  Weight:      Height:        Intake/Output Summary (Last 24 hours) at 01/16/2021 1030 Last data filed at 01/16/2021 0630 Gross per 24 hour  Intake 1389.92 ml  Output 1350 ml  Net 39.92 ml    Filed Weights   01/11/21 1132  Weight: 57.2 kg    Examination:  Eomi ncat rom intact Abd soft -heating pad on surface + 1 LE edema Neuro intact  Data Reviewed: personally reviewed   CBC    Component Value Date/Time   WBC 13.9 (H) 01/16/2021 0529   RBC 3.03 (L) 01/16/2021 0529   HGB 10.3 (L) 01/16/2021 0529   HCT 30.3 (L) 01/16/2021 0529   PLT 250 01/16/2021 0529   MCV 100.0 01/16/2021 0529   MCH 34.0 01/16/2021 0529   MCHC 34.0 01/16/2021 0529   RDW 13.1 01/16/2021 0529   LYMPHSABS 1.6 01/16/2021 0529   MONOABS 2.0 (H) 01/16/2021 0529   EOSABS 0.1 01/16/2021 0529   BASOSABS 0.1 01/16/2021 0529   CMP Latest Ref Rng & Units 01/16/2021 01/15/2021 01/14/2021  Glucose 70 - 99 mg/dL 104(H) 139(H) 126(H)  BUN 8 - 23 mg/dL 9 11 7(L)  Creatinine 0.44 - 1.00 mg/dL 0.48 0.56 0.37(L)  Sodium 135 - 145 mmol/L 139 137 137  Potassium 3.5 - 5.1 mmol/L 2.8(L)  3.4(L) 3.3(L)  Chloride 98 - 111 mmol/L 102 102 103  CO2 22 - 32 mmol/L '26 23 28  '$ Calcium 8.9 - 10.3 mg/dL 8.1(L) 8.0(L) 8.2(L)  Total Protein 6.5 - 8.1 g/dL 5.1(L) 5.4(L) 5.7(L)  Total Bilirubin 0.3 - 1.2 mg/dL 0.7 1.2 0.9  Alkaline Phos 38 - 126 U/L 45 35(L) 47  AST 15 - 41 U/L '16 16 20  '$ ALT 0 - 44 U/L '13 13 15     '$ Radiology Studies: DG Abd 1 View  Result Date: 01/15/2021 CLINICAL DATA:  Abdominal distension EXAM: ABDOMEN - 1 VIEW COMPARISON:  None. FINDINGS: Nasogastric tube with the tip projecting over the antrum of the stomach. Relative paucity of bowel gas. Moderate amount of stool in the ascending colon. No  evidence of pneumoperitoneum, portal venous gas or pneumatosis. No pathologic calcifications along the expected course of the ureters. No acute osseous abnormality. Levoscoliosis of the lumbar spine. Lower lumbar spine spondylosis. IMPRESSION: Nasogastric tube with the tip projecting over the antrum of the stomach. Relative paucity of bowel gas. Electronically Signed   By: Kathreen Devoid M.D.   On: 01/15/2021 08:09   DG Abd 1 View  Result Date: 01/14/2021 CLINICAL DATA:  Generalized abdominal pain and distention. EXAM: ABDOMEN - 1 VIEW COMPARISON:  January 13, 2021. FINDINGS: The bowel gas pattern is normal. No radio-opaque calculi or other significant radiographic abnormality are seen. IMPRESSION: Negative. Electronically Signed   By: Marijo Conception M.D.   On: 01/14/2021 12:40     Scheduled Meds:  Chlorhexidine Gluconate Cloth  6 each Topical Daily   enoxaparin (LOVENOX) injection  40 mg Subcutaneous Q24H   levothyroxine  25 mcg Intravenous Daily   magic mouthwash  5 mL Oral QID   pantoprazole (PROTONIX) IV  40 mg Intravenous Q24H   Continuous Infusions:  sodium chloride 125 mL/hr at 01/16/21 0238   lactated ringers with kcl     lactated ringers     methocarbamol (ROBAXIN) IV     potassium chloride       LOS: 5 days   Time spent: 30  Nita Sells, MD [facility] To contact the attending provider between 7A-7P or the covering provider during after hours 7P-7A, please log into the web site [email or web address] and access using universal [place] password for that web site. If you do not have the password, please call the hospital operator.  01/16/2021, 10:30 AM

## 2021-01-16 NOTE — Progress Notes (Signed)
   01/16/21 1427  Mobility  Activity Refused mobility     Cancellation/Refusal: Pt refused mobility at this time due to increase levels of pain and fatigue. Will check back as schedule permits.

## 2021-01-16 NOTE — Care Management Important Message (Signed)
Important Message  Patient Details IM Letter given to the Patient. Name: Laura Barnes MRN: [id number] Date of Birth: [birth year]   Medicare Important Message Given:  Yes     Kerin Salen 01/16/2021, 11:22 AM

## 2021-01-16 NOTE — Progress Notes (Addendum)
Laura Barnes [id number] [birth year]  CARE TEAM:  PCP: Binnie Rail, MD  Outpatient Care Team: Patient Care Team: Binnie Rail, MD as PCP - General (Internal Medicine) Bo Merino, MD as Consulting Physician (Rheumatology) Dorothy Spark, MD (Inactive) as Consulting Physician (Cardiology) [name], MD as Consulting Physician (Otolaryngology) Lyndal Pulley, DO as Consulting Physician (Family Medicine) Brand Males, MD as Consulting Physician (Pulmonary Disease) Magnus Sinning, MD as Consulting Physician (Physical Medicine and Rehabilitation) Ashok Pall, MD as Consulting Physician (Neurosurgery) Charlton Haws, [facility] as Pharmacist (Pharmacist)  Inpatient Treatment Team: Treatment Team: Attending Provider: Nita Sells, MD; Consulting Physician: Edison Pace, Md, MD; Pharmacist: Eudelia Bunch, [facility]; Rounding Team: Fatima Blank, MD; Registered Nurse: Fredderick Erb, RN; Registered Nurse: Sibyl Parr, RN; Technician: Samara Snide, NT; Registered Nurse: Arlyss Queen, RN; Pharmacist: Efraim Kaufmann, [facility]   Problem List:   Principal Problem:   Small bowel strangulation s/p jejunal resection 8/16/202 Active Problems:   Anxiety   Chronic venous insufficiency   Gastroesophageal reflux disease   IBS   Hypothyroidism   Fibromyalgia   Difficulty urinating   Hypokalemia   SBO (small bowel obstruction) ([place])   Encounter for imaging study to confirm nasogastric (NG) tube placement   Encounter for imaging study to confirm nasogastric tube placement   2 Days Post-Op  01/14/2021  POST-OPERATIVE DIAGNOSIS:  CLOSED LOOP STRANGULATED SMALL BOWEL OBSTRUCTION    PROCEDURE:   LAPAROSCOPIC LYSIS OF ADHESIONS SMALL BOWEL RESECTION COLON SEROSAL REPAIR   SURGEON:  Adin Hector, MD, FACS   ASSISTANT: Romana Juniper, MD, FACS  OR FINDINGS:  Loop of jejunum trapped in the pelvis between the band between the left adnexa  and sigmoid causing an internal hernia.  Obvious transition zone point.  Significant ischemia and necrosis.  No perforation.  Resection done with anastomosis.  No other interloop adhesions.  Moderate adhesions around floppy cecal bascule freed off.  Small serosal repair done.  Omentum freed off to allow to be placed down into the pelvis.   CASE DATA:   Type of patient?: LDOW CASE (Surgical Hospitalist [facility] Inpatient)   Status of Case? URGENT Add On   Infection Present At Time Of Surgery (PATOS)?  PHLEGMON  Assessment  Stable with expected postop ileus after resection of necrotic bowel from strangulated hernia  [facility] Stay = 5 days)  Plan:  -NGT until flatus, then clamping trial -IVF -hold off on TPN unless ileus longer than 7d (= not open by 8/20) -nausea & pain control -patient struggling with headaches.  We will shorten interval of hydromorphone so it is more available.  Add scheduled Robaxin.  Continue Tylenol as needed and ice/heat. -Hypothyroidism, chronic pain, anxiety, GERD, etc. Per primary service -VTE prophylaxis- SCDs, etc -mobilize as tolerated to help recovery  Disposition: per primary service      25 minutes spent in review, evaluation, examination, counseling, and coordination of care.   I have reviewed this patient's available data, including medical history, events of note, physical examination and test results as part of my evaluation.  A significant portion of that time was spent in counseling.  Care during the described time interval was provided by me.  01/16/2021    Subjective: (Chief complaint)  Some soreness abdomen mostly controlled.  Some fullness and irritation at the nose.  Does not like to take the Magic mouthwash.  Tried to get up some.  Objective:  Vital signs:  Vitals:   01/15/21 1335 01/15/21  1735 01/15/21 2103 01/16/21 0442  BP: (!) 152/62 (!) 146/62 (!) 171/73 (!) 174/80  Pulse: (!) 111 (!) 117 100 (!) 108  Resp: '16 16 20 16   '$ Temp: (!) 97.5 F (36.4 C) 98.3 F (36.8 C) 99.7 F (37.6 C) 97.9 F (36.6 C)  TempSrc: Oral Oral Oral Oral  SpO2: 93% 98% 96% 97%  Weight:      Height:        Last BM Date: 01/10/21  Intake/Output   Yesterday:  08/17 0701 - 08/18 0700 In: 1479.9 [P.O.:330; I.V.:1149.9] Out: 1500 [Urine:400; Emesis/NG output:1100] This shift:  No intake/output data recorded.  Bowel function:  Flatus: No  BM:  No  Drain:  NGT - thinly bilious   Physical Exam:  General: Pt awake/alert in no acute distress.  Tired but not toxic. Eyes: PERRL, normal EOM.  Sclera clear.  No icterus Neuro: CN II-XII intact w/o focal sensory/motor deficits. Lymph: No head/neck/groin lymphadenopathy Psych:  No delerium/psychosis/paranoia.  Oriented x 4 HENT: Normocephalic, Mucus membranes moist.  No thrush Neck: Supple, No tracheal deviation.  No obvious thyromegaly Chest: No pain to chest wall compression.  Good respiratory excursion.  No audible wheezing CV:  Pulses intact.  Regular rhythm.  No major extremity edema MS: Normal AROM mjr joints.  No obvious deformity  Abdomen: Soft.  Mildy distended.  Mildly tender at incisions only.  No evidence of peritonitis.  No incarcerated hernias.  Ext:   No deformity.  No mjr edema.  No cyanosis Skin: No petechiae / purpurea.  No major sores.  Warm and dry    Results:   Cultures: Recent Results (from the past 720 hour(s))  Resp Panel by RT-PCR (Flu A&B, Covid) Nasopharyngeal Swab     Status: None   Collection Time: 01/11/21  5:47 AM   Specimen: Nasopharyngeal Swab; Nasopharyngeal(NP) swabs in vial transport medium  Result Value Ref Range Status   SARS Coronavirus 2 by RT PCR NEGATIVE NEGATIVE Final    Comment: (NOTE) SARS-CoV-2 target nucleic acids are NOT DETECTED.  The SARS-CoV-2 RNA is generally detectable in upper respiratory specimens during the acute phase of infection. The lowest concentration of SARS-CoV-2 viral copies this assay can detect  is 138 copies/mL. A negative result does not preclude SARS-Cov-2 infection and should not be used as the sole basis for treatment or other patient management decisions. A negative result may occur with  improper specimen collection/handling, submission of specimen other than nasopharyngeal swab, presence of viral mutation(s) within the areas targeted by this assay, and inadequate number of viral copies(<138 copies/mL). A negative result must be combined with clinical observations, patient history, and epidemiological information. The expected result is Negative.  Fact Sheet for Patients:  EntrepreneurPulse.com.au  Fact Sheet for Healthcare Providers:  IncredibleEmployment.be  This test is no t yet approved or cleared by the [place] FDA and  has been authorized for detection and/or diagnosis of SARS-CoV-2 by FDA under an Emergency Use Authorization (EUA). This EUA will remain  in effect (meaning this test can be used) for the duration of the COVID-19 declaration under Section 564(b)(1) of the Act, 21 U.S.C.section 360bbb-3(b)(1), unless the authorization is terminated  or revoked sooner.       Influenza A by PCR NEGATIVE NEGATIVE Final   Influenza B by PCR NEGATIVE NEGATIVE Final    Comment: (NOTE) The Xpert Xpress SARS-CoV-2/FLU/RSV plus assay is intended as an aid in the diagnosis of influenza from Nasopharyngeal swab specimens and should not be used as  a sole basis for treatment. Nasal washings and aspirates are unacceptable for Xpert Xpress SARS-CoV-2/FLU/RSV testing.  Fact Sheet for Patients: EntrepreneurPulse.com.au  Fact Sheet for Healthcare Providers: IncredibleEmployment.be  This test is not yet approved or cleared by the [place] FDA and has been authorized for detection and/or diagnosis of SARS-CoV-2 by FDA under an Emergency Use Authorization (EUA). This EUA will remain in effect  (meaning this test can be used) for the duration of the COVID-19 declaration under Section 564(b)(1) of the Act, 21 U.S.C. section 360bbb-3(b)(1), unless the authorization is terminated or revoked.  Performed at [facility], [place] [address] [place], [place] [address]     Labs: Results for orders placed or performed during the hospital encounter of 01/11/21 (from the past 48 hour(s))  CBC     Status: Abnormal   Collection Time: 01/14/21  8:17 AM  Result Value Ref Range   WBC 17.4 (H) 4.0 - 10.5 K/uL   RBC 3.53 (L) 3.87 - 5.11 MIL/uL   Hemoglobin 12.2 12.0 - 15.0 g/dL   HCT 35.7 (L) 36.0 - 46.0 %   MCV 101.1 (H) 80.0 - 100.0 fL   MCH 34.6 (H) 26.0 - 34.0 pg   MCHC 34.2 30.0 - 36.0 g/dL   RDW 13.2 11.5 - 15.5 %   Platelets 263 150 - 400 K/uL   nRBC 0.0 0.0 - 0.2 %    Comment: Performed at [facility], [place] [address], [place], [place] [address]  Comprehensive metabolic panel     Status: Abnormal   Collection Time: 01/14/21  8:17 AM  Result Value Ref Range   Sodium 137 135 - 145 mmol/L   Potassium 3.3 (L) 3.5 - 5.1 mmol/L    Comment: DELTA CHECK NOTED   Chloride 103 98 - 111 mmol/L   CO2 28 22 - 32 mmol/L   Glucose, Bld 126 (H) 70 - 99 mg/dL    Comment: Glucose reference range applies only to samples taken after fasting for at least 8 hours.   BUN 7 (L) 8 - 23 mg/dL   Creatinine, Ser 0.37 (L) 0.44 - 1.00 mg/dL   Calcium 8.2 (L) 8.9 - 10.3 mg/dL   Total Protein 5.7 (L) 6.5 - 8.1 g/dL   Albumin 2.7 (L) 3.5 - 5.0 g/dL   AST 20 15 - 41 U/L   ALT 15 0 - 44 U/L   Alkaline Phosphatase 47 38 - 126 U/L   Total Bilirubin 0.9 0.3 - 1.2 mg/dL   GFR, Estimated >60 >60 mL/min    Comment: (NOTE) Calculated using the CKD-EPI Creatinine Equation (2021)    Anion gap 6 5 - 15    Comment: Performed at [facility], [place] [address], [place], [place] [address]  Glucose, capillary     Status: Abnormal   Collection Time: 01/14/21  5:32  PM  Result Value Ref Range   Glucose-Capillary 142 (H) 70 - 99 mg/dL    Comment: Glucose reference range applies only to samples taken after fasting for at least 8 hours.  Glucose, capillary     Status: Abnormal   Collection Time: 01/14/21 11:42 PM  Result Value Ref Range   Glucose-Capillary 116 (H) 70 - 99 mg/dL    Comment: Glucose reference range applies only to samples taken after fasting for at least 8 hours.  CBC     Status: Abnormal   Collection Time: 01/15/21  4:34 AM  Result Value Ref Range   WBC 13.4 (  H) 4.0 - 10.5 K/uL   RBC 3.52 (L) 3.87 - 5.11 MIL/uL   Hemoglobin 12.0 12.0 - 15.0 g/dL   HCT 35.3 (L) 36.0 - 46.0 %   MCV 100.3 (H) 80.0 - 100.0 fL   MCH 34.1 (H) 26.0 - 34.0 pg   MCHC 34.0 30.0 - 36.0 g/dL   RDW 13.0 11.5 - 15.5 %   Platelets 272 150 - 400 K/uL   nRBC 0.0 0.0 - 0.2 %    Comment: Performed at [facility], [place] [address] [place], [place] [address]  Comprehensive metabolic panel     Status: Abnormal   Collection Time: 01/15/21  4:34 AM  Result Value Ref Range   Sodium 137 135 - 145 mmol/L   Potassium 3.4 (L) 3.5 - 5.1 mmol/L   Chloride 102 98 - 111 mmol/L   CO2 23 22 - 32 mmol/L   Glucose, Bld 139 (H) 70 - 99 mg/dL    Comment: Glucose reference range applies only to samples taken after fasting for at least 8 hours.   BUN 11 8 - 23 mg/dL   Creatinine, Ser 0.56 0.44 - 1.00 mg/dL   Calcium 8.0 (L) 8.9 - 10.3 mg/dL   Total Protein 5.4 (L) 6.5 - 8.1 g/dL   Albumin 2.8 (L) 3.5 - 5.0 g/dL   AST 16 15 - 41 U/L   ALT 13 0 - 44 U/L   Alkaline Phosphatase 35 (L) 38 - 126 U/L   Total Bilirubin 1.2 0.3 - 1.2 mg/dL   GFR, Estimated >60 >60 mL/min    Comment: (NOTE) Calculated using the CKD-EPI Creatinine Equation (2021)    Anion gap 12 5 - 15    Comment: Performed at [facility], [place] [address] [place], [place] [address]  Magnesium     Status: Abnormal   Collection Time: 01/15/21  4:34 AM  Result Value Ref Range    Magnesium 1.5 (L) 1.7 - 2.4 mg/dL    Comment: Performed at [facility], [place] [address] [place], [place] [address]  Prealbumin     Status: Abnormal   Collection Time: 01/15/21  4:34 AM  Result Value Ref Range   Prealbumin 9.0 (L) 18 - 38 mg/dL    Comment: Performed at [facility], [place] [address] [place], [place] [address]  Phosphorus     Status: None   Collection Time: 01/15/21  4:34 AM  Result Value Ref Range   Phosphorus 2.8 2.5 - 4.6 mg/dL    Comment: Performed at [facility], [place] [address] [place],  [address]  Glucose, capillary     Status: Abnormal   Collection Time: 01/15/21  5:53 AM  Result Value Ref Range   Glucose-Capillary 139 (H) 70 - 99 mg/dL    Comment: Glucose reference range applies only to samples taken after fasting for at least 8 hours.  Glucose, capillary     Status: Abnormal   Collection Time: 01/15/21 11:40 AM  Result Value Ref Range   Glucose-Capillary 127 (H) 70 - 99 mg/dL    Comment: Glucose reference range applies only to samples taken after fasting for at least 8 hours.  Glucose, capillary     Status: Abnormal   Collection Time: 01/15/21  5:43 PM  Result Value Ref Range   Glucose-Capillary 128 (H) 70 - 99 mg/dL    Comment: Glucose reference range applies only to samples taken after fasting for at least 8 hours.  Glucose,  capillary     Status: Abnormal   Collection Time: 01/16/21 12:26 AM  Result Value Ref Range   Glucose-Capillary 114 (H) 70 - 99 mg/dL    Comment: Glucose reference range applies only to samples taken after fasting for at least 8 hours.   Comment 1 Notify RN    Comment 2 Document in Chart   Glucose, capillary     Status: None   Collection Time: 01/16/21  5:17 AM  Result Value Ref Range   Glucose-Capillary 87 70 - 99 mg/dL    Comment: Glucose reference range applies only to samples taken after fasting for at least 8 hours.  Comprehensive metabolic panel     Status:  Abnormal   Collection Time: 01/16/21  5:29 AM  Result Value Ref Range   Sodium 139 135 - 145 mmol/L   Potassium 2.8 (L) 3.5 - 5.1 mmol/L    Comment: DELTA CHECK NOTED   Chloride 102 98 - 111 mmol/L   CO2 26 22 - 32 mmol/L   Glucose, Bld 104 (H) 70 - 99 mg/dL    Comment: Glucose reference range applies only to samples taken after fasting for at least 8 hours.   BUN 9 8 - 23 mg/dL   Creatinine, Ser 0.48 0.44 - 1.00 mg/dL   Calcium 8.1 (L) 8.9 - 10.3 mg/dL   Total Protein 5.1 (L) 6.5 - 8.1 g/dL   Albumin 2.3 (L) 3.5 - 5.0 g/dL   AST 16 15 - 41 U/L   ALT 13 0 - 44 U/L   Alkaline Phosphatase 45 38 - 126 U/L   Total Bilirubin 0.7 0.3 - 1.2 mg/dL   GFR, Estimated >60 >60 mL/min    Comment: (NOTE) Calculated using the CKD-EPI Creatinine Equation (2021)    Anion gap 11 5 - 15    Comment: Performed at [facility], [place] [address], [place], [place] [address]  CBC with Differential/Platelet     Status: Abnormal   Collection Time: 01/16/21  5:29 AM  Result Value Ref Range   WBC 13.9 (H) 4.0 - 10.5 K/uL   RBC 3.03 (L) 3.87 - 5.11 MIL/uL   Hemoglobin 10.3 (L) 12.0 - 15.0 g/dL   HCT 30.3 (L) 36.0 - 46.0 %   MCV 100.0 80.0 - 100.0 fL   MCH 34.0 26.0 - 34.0 pg   MCHC 34.0 30.0 - 36.0 g/dL   RDW 13.1 11.5 - 15.5 %   Platelets 250 150 - 400 K/uL   nRBC 0.0 0.0 - 0.2 %   Neutrophils Relative % 67 %   Neutro Abs 9.3 (H) 1.7 - 7.7 K/uL   Lymphocytes Relative 12 %   Lymphs Abs 1.6 0.7 - 4.0 K/uL   Monocytes Relative 14 %   Monocytes Absolute 2.0 (H) 0.1 - 1.0 K/uL   Eosinophils Relative 1 %   Eosinophils Absolute 0.1 0.0 - 0.5 K/uL   Basophils Relative 1 %   Basophils Absolute 0.1 0.0 - 0.1 K/uL   Immature Granulocytes 5 %   Abs Immature Granulocytes 0.71 (H) 0.00 - 0.07 K/uL    Comment: Performed at [facility], [place] [address] [place], [place] [address]    Imaging / Studies: DG Abd 1 View  Result Date: 01/15/2021 CLINICAL DATA:  Abdominal  distension EXAM: ABDOMEN - 1 VIEW COMPARISON:  None. FINDINGS: Nasogastric tube with the tip projecting over the antrum of the stomach. Relative paucity of bowel gas. Moderate amount of stool in the ascending  colon. No evidence of pneumoperitoneum, portal venous gas or pneumatosis. No pathologic calcifications along the expected course of the ureters. No acute osseous abnormality. Levoscoliosis of the lumbar spine. Lower lumbar spine spondylosis. IMPRESSION: Nasogastric tube with the tip projecting over the antrum of the stomach. Relative paucity of bowel gas. Electronically Signed   By: Kathreen Devoid M.D.   On: 01/15/2021 08:09   DG Abd 1 View  Result Date: 01/14/2021 CLINICAL DATA:  Generalized abdominal pain and distention. EXAM: ABDOMEN - 1 VIEW COMPARISON:  January 13, 2021. FINDINGS: The bowel gas pattern is normal. No radio-opaque calculi or other significant radiographic abnormality are seen. IMPRESSION: Negative. Electronically Signed   By: Marijo Conception M.D.   On: 01/14/2021 12:40    Medications / Allergies: per chart  Antibiotics: Anti-infectives (From admission, onward)    Start     Dose/Rate Route Frequency Ordered Stop   01/15/21 0600  cefoTEtan (CEFOTAN) 2 g in sodium chloride 0.9 % 100 mL IVPB  Status:  Discontinued       Note to Pharmacy: Pharmacy may adjust dose strength for optimal dosing.   Send with patient on call to the OR.  Anesthesia to complete antibiotic administration <47mn [facility]   2 g 200 mL/hr over 30 Minutes Intravenous On call to O.R. 01/14/21 1216 01/14/21 1219   01/14/21 2200  cefoTEtan (CEFOTAN) 2 g in sodium chloride 0.9 % 100 mL IVPB        2 g 200 mL/hr over 30 Minutes Intravenous Every 12 hours 01/14/21 1632 01/14/21 2149   01/14/21 1215  cefoTEtan (CEFOTAN) 2 g in sodium chloride 0.9 % 100 mL IVPB        2 g 200 mL/hr over 30 Minutes Intravenous On call to O.R. 01/14/21 1115 01/14/21 1353         Note: Portions of  this report may have been transcribed using voice recognition software. Every effort was made to ensure accuracy; however, inadvertent computerized transcription errors may be present.   Any transcriptional errors that result from this process are unintentional.    SAdin Hector MD, FACS, MASCRS Esophageal, Gastrointestinal & Colorectal Surgery Robotic and Minimally Invasive Surgery  [facility] [address] [address] [address] [place] [phone] [phone]  CONTACT INFORMATION:  Weekday (9AM-5PM): Call [facility] main office at [phone] Weeknight (5PM-9AM) or Weekend/Holiday: Check [email or web address] (password " [email or web address]") for General Surgery [facility] coverage  (Please, do not use SecureChat as it is not reliable communication to operating surgeons for immediate patient care)      01/16/2021  7:54 AM

## 2021-01-17 ENCOUNTER — Inpatient Hospital Stay (HOSPITAL_COMMUNITY): Payer: Medicare Other

## 2021-01-17 DIAGNOSIS — K562 Volvulus: Secondary | ICD-10-CM | POA: Diagnosis not present

## 2021-01-17 LAB — BASIC METABOLIC PANEL
Anion gap: 9 (ref 5–15)
BUN: 5 mg/dL — ABNORMAL LOW (ref 8–23)
CO2: 23 mmol/L (ref 22–32)
Calcium: 7.7 mg/dL — ABNORMAL LOW (ref 8.9–10.3)
Chloride: 105 mmol/L (ref 98–111)
Creatinine, Ser: 0.41 mg/dL — ABNORMAL LOW (ref 0.44–1.00)
GFR, Estimated: >60 mL/min (ref >60)
Glucose, Bld: 85 mg/dL (ref 70–99)
Potassium: 2.8 mmol/L — ABNORMAL LOW (ref 3.5–5.1)
Sodium: 137 mmol/L (ref 135–145)

## 2021-01-17 LAB — GLUCOSE, CAPILLARY
Glucose-Capillary: 79 mg/dL (ref 70–99)
Glucose-Capillary: 80 mg/dL (ref 70–99)
Glucose-Capillary: 93 mg/dL (ref 70–99)
Glucose-Capillary: 94 mg/dL (ref 70–99)

## 2021-01-17 LAB — MAGNESIUM: Magnesium: 1.6 mg/dL — ABNORMAL LOW (ref 1.7–2.4)

## 2021-01-17 LAB — CBC
HCT: 27.2 % — ABNORMAL LOW (ref 36.0–46.0)
Hemoglobin: 9 g/dL — ABNORMAL LOW (ref 12.0–15.0)
MCH: 33.7 pg (ref 26.0–34.0)
MCHC: 33.1 g/dL (ref 30.0–36.0)
MCV: 101.9 fL — ABNORMAL HIGH (ref 80.0–100.0)
Platelets: 247 10*3/uL (ref 150–400)
RBC: 2.67 MIL/uL — ABNORMAL LOW (ref 3.87–5.11)
RDW: 13.2 % (ref 11.5–15.5)
WBC: 14.7 10*3/uL — ABNORMAL HIGH (ref 4.0–10.5)
nRBC: 0 % (ref 0.0–0.2)

## 2021-01-17 MED ORDER — FUROSEMIDE 40 MG PO TABS
40.0000 mg | ORAL_TABLET | Freq: Once | ORAL | Status: DC
  Filled 2021-01-17: qty 1

## 2021-01-17 MED ORDER — POTASSIUM PHOSPHATES 15 MMOLE/5ML IV SOLN
30.0000 mmol | Freq: Once | INTRAVENOUS | Status: AC
  Administered 2021-01-17: 30 mmol via INTRAVENOUS
  Filled 2021-01-17: qty 10

## 2021-01-17 MED ORDER — MAGNESIUM SULFATE 4 GM/100ML IV SOLN
4.0000 g | Freq: Once | INTRAVENOUS | Status: AC
  Administered 2021-01-17: 4 g via INTRAVENOUS
  Filled 2021-01-17: qty 100

## 2021-01-17 MED ORDER — MAGNESIUM SULFATE 2 GM/50ML IV SOLN: 2.0000 g | Freq: Once | INTRAVENOUS | Status: DC

## 2021-01-17 NOTE — Progress Notes (Signed)
   01/17/21 1400  Mobility  Activity Transferred:  Bed to chair;Transferred to/from [facility]  Level of Assistance Standby assist, set-up cues, supervision of patient - no hands on  Assistive Device None  Distance Ambulated (ft) 10 ft  Mobility Out of bed for toileting  Mobility Response Tolerated well  Mobility performed by Mobility specialist  $Mobility charge 1 Mobility   Upon entering pt was in her bed, nurse was present. Pt was reluctant to mobilize, however agreed to sit in the chair. Pt Min A to sit up in bed and get to EOB. Pt stood and needed to void, and requested to use BSC. After voiding, transferred pt to her chair. Left with call bell at side, no other complaints. Nurse notified of session.    [place] Specialist Acute Rehab Services Office: [phone]

## 2021-01-17 NOTE — Progress Notes (Signed)
Progress Note  3 Days Post-Op  Subjective: CC: nausea Abdominal pain has improved with pain medication adjustments. She is still nauseas today but no emesis. Passing flatus. No BM yet. Is not getting OOB She denies respiratory complaints  Objective: Vital signs in last 24 hours: Temp:  [97.3 F (36.3 C)-99.5 F (37.5 C)] 98.9 F (37.2 C) (08/19 0527) Pulse Rate:  [105-109] 105 (08/19 0527) Resp:  [16-20] 20 (08/19 0527) BP: (145-171)/(66-72) 145/66 (08/19 0527) SpO2:  [94 %-97 %] 97 % (08/19 0527) Last BM Date: 01/10/21  Intake/Output from previous day: 08/18 0701 - 08/19 0700 In: 3846.9 [I.V.:2645.7; IV Piggyback:1201.2] Out: 1700 [Urine:750; Emesis/NG output:950] Intake/Output this shift: No intake/output data recorded.  PE: General: pleasant, WD, female who is laying in bed in NAD HEENT: head is normocephalic, atraumatic.   Mouth is pink and moist Heart:  Palpable radial and pedal pulses bilaterally Lungs:  Respiratory effort nonlabored on room air Abd: soft, +BS, mild distension. Mild to moderate TTP greatest over bilateral lower quadrant. Dressing c/d/I NGT cannister with 700 ml bilious output MSK: all 4 extremities are symmetrical with no cyanosis, clubbing, or edema. Skin: warm and dry with no masses, lesions, or rashes Psych: A&Ox3 with an appropriate affect.    Lab Results:  Recent Labs    01/16/21 0529 01/17/21 0431  WBC 13.9* 14.7*  HGB 10.3* 9.0*  HCT 30.3* 27.2*  PLT 250 247   BMET Recent Labs    01/16/21 0529 01/17/21 0431  NA 139 137  K 2.8* 2.8*  CL 102 105  CO2 26 23  GLUCOSE 104* 85  BUN 9 5*  CREATININE 0.48 0.41*  CALCIUM 8.1* 7.7*   PT/INR No results for input(s): LABPROT, INR in the last 72 hours. CMP     Component Value Date/Time   NA 137 01/17/2021 0431   K 2.8 (L) 01/17/2021 0431   CL 105 01/17/2021 0431   CO2 23 01/17/2021 0431   GLUCOSE 85 01/17/2021 0431   BUN 5 (L) 01/17/2021 0431   CREATININE 0.41 (L)  01/17/2021 0431   CALCIUM 7.7 (L) 01/17/2021 0431   PROT 5.1 (L) 01/16/2021 0529   ALBUMIN 2.3 (L) 01/16/2021 0529   AST 16 01/16/2021 0529   ALT 13 01/16/2021 0529   ALKPHOS 45 01/16/2021 0529   BILITOT 0.7 01/16/2021 0529   GFRNONAA >60 01/17/2021 0431   GFRAA >60 04/23/2017 0645   Lipase     Component Value Date/Time   LIPASE 21 01/11/2021 0321       Studies/Results: DG CHEST PORT 1 VIEW  Result Date: 01/17/2021 CLINICAL DATA:  Pneumonia. EXAM: PORTABLE CHEST 1 VIEW COMPARISON:  Chest x-ray dated January 11, 2021. FINDINGS: Unchanged enteric tube. The heart size and mediastinal contours are within normal limits. Normal pulmonary vascularity. New hazy density at both lung bases with blunting of the costophrenic angles. No pneumothorax. No acute osseous abnormality. IMPRESSION: 1. New layering small bilateral pleural effusions with adjacent bibasilar atelectasis. Electronically Signed   By: Titus Dubin M.D.   On: 01/17/2021 09:04    Anti-infectives: Anti-infectives (From admission, onward)    Start     Dose/Rate Route Frequency Ordered Stop   01/15/21 0600  cefoTEtan (CEFOTAN) 2 g in sodium chloride 0.9 % 100 mL IVPB  Status:  Discontinued       Note to Pharmacy: Pharmacy may adjust dose strength for optimal dosing.   Send with patient on call to the OR.  Anesthesia to complete antibiotic administration <103mn  prior to incision per [facility]   2 g 200 mL/hr over 30 Minutes Intravenous On call to O.R. 01/14/21 1216 01/14/21 1219   01/14/21 2200  cefoTEtan (CEFOTAN) 2 g in sodium chloride 0.9 % 100 mL IVPB        2 g 200 mL/hr over 30 Minutes Intravenous Every 12 hours 01/14/21 1632 01/14/21 2149   01/14/21 1215  cefoTEtan (CEFOTAN) 2 g in sodium chloride 0.9 % 100 mL IVPB        2 g 200 mL/hr over 30 Minutes Intravenous On call to O.R. 01/14/21 1115 01/14/21 1353        Assessment/Plan CLOSED LOOP STRANGULATED SMALL BOWEL OBSTRUCTION  - POD3 s/p LAPAROSCOPIC  LYSIS OF ADHESIONS, SMALL BOWEL RESECTION, COLON SEROSAL REPAIR Dr Johney Maine 8/16 - passing flatus and NGT output seems to be trending down slightly but still nauseas. Keep to LIWS today but hopeful for clamping trial tomorrow - if unable to tolerate clamping trial then will need to start TNA tomorrow - pain control improved on regimen of scheduled robaxin, tylenol, shortened hydromorphone interval, ice/heat - encouraged OOB and ambulation - monitor leukocytosis  FEN: NGT LIWS, IVF, replete Mag/P/K VTE: lovenox ID: [email or web address] periop  -Hypothyroidism, chronic pain, anxiety, GERD, etc. Per primary service   LOS: 6 days    Winferd Humphrey, [facility] 01/17/2021, 9:33 AM Please see Amion for pager number during day hours 7:00am-4:30pm

## 2021-01-17 NOTE — Progress Notes (Signed)
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 L Labial swelling Looks red and inflammed--unlikely infection, but will watch May need GYN input Pleural effusion Leukocytosis 14 Watch for fever--CXR shows atelectasis--needs IS teaching Mobilise and careful with ICE chips--d/w RN HTN, sinus tach Heart rate suncontrolled-discontinue Vasotec-continue metoprolol IV Reevaluate and adjust as needed HFpEF 123456 grade 1 diastolic dysfunction [id number] Maxide on hold Give lasix 40 x 1 given overall anasarca Hypokalemia give LR +40 of K at 40cc/h Magnesium 2 g today IV Hypothyroid Replacing Synthroid 25 mcg IV Fibromyalgia/depression Resume Xanax 0.5 3 times daily hydroxyzine 25 3 times daily when able   DVT prophylaxis: Lovenox Code Status: Full Family Communication: patient updated alone Disposition:  Status is: Inpatient  Remains inpatient appropriate because:Hemodynamically unstable  Dispo: The patient is from: Home              Anticipated d/c is to: Home              Patient currently is not medically stable to d/c.   Difficult to place patient No       Consultants:  General surgery  Procedures: Laparoscopy 8/16  Antimicrobials: Perioperative   Subjective:  Awake  coherent in nad no focal deficit Eating ice chips No cp no fever no chills C/o labial swelling x 3 days--made me aware today   Objective: Vitals:   01/16/21 1327 01/16/21 2134 01/17/21 0527 01/17/21 1351  BP: (!) 171/72 (!) 155/67 (!) 145/66 (!) 155/75  Pulse: (!) 107 (!) 109 (!) 105 94  Resp: '16 20 20 15  '$ Temp: (!) 97.3 F (36.3 C) 99.5 F (37.5 C) 98.9 F (37.2 C) 99 F (37.2 C)  TempSrc: Oral  Oral Oral  SpO2: 97% 94% 97% 98%  Weight:      Height:        Intake/Output Summary (Last 24 hours) at 01/17/2021 1740 Last data filed at 01/17/2021 1400 Gross per 24 hour  Intake 2641.97 ml  Output 1200 ml  Net 1441.97 ml    Filed Weights   01/11/21 1132  Weight: 57.2 kg    Examination:  Eomi ncat rom intact Abd soft -heating pad on surface Anasarca L Labial swelling with mild discoloration--no pus no abscess on exam Digital bimanual exam deferred Neuro intact  Data Reviewed: personally reviewed   CBC    Component Value Date/Time   WBC 14.7 (H) 01/17/2021 0431   RBC 2.67 (L) 01/17/2021 0431   HGB 9.0 (L) 01/17/2021 0431   HCT 27.2 (L) 01/17/2021 0431   PLT 247 01/17/2021 0431   MCV 101.9 (H) 01/17/2021 0431   MCH 33.7 01/17/2021 0431   MCHC 33.1 01/17/2021 0431   RDW 13.2 01/17/2021 0431   LYMPHSABS 1.6 01/16/2021 0529   MONOABS 2.0 (H) 01/16/2021 0529   EOSABS 0.1 01/16/2021 0529   BASOSABS 0.1 01/16/2021 0529   CMP Latest Ref Rng & Units  01/17/2021 01/16/2021 01/15/2021  Glucose 70 - 99 mg/dL 85 104(H) 139(H)  BUN 8 - 23 mg/dL 5(L) 9 11  Creatinine 0.44 - 1.00 mg/dL 0.41(L) 0.48 0.56  Sodium 135 - 145 mmol/L 137 139 137  Potassium 3.5 - 5.1 mmol/L 2.8(L) 2.8(L) 3.4(L)  Chloride 98 - 111 mmol/L 105 102 102  CO2 22 - 32 mmol/L '23 26 23  '$ Calcium 8.9 - 10.3 mg/dL 7.7(L) 8.1(L) 8.0(L)  Total Protein 6.5 - 8.1 g/dL - 5.1(L) 5.4(L)  Total Bilirubin 0.3 - 1.2 mg/dL - 0.7 1.2  Alkaline Phos 38 - 126 U/L - 45 35(L)  AST 15 - 41 U/L - 16 16  ALT 0 - 44 U/L - 13  13     Radiology Studies: DG CHEST PORT 1 VIEW  Result Date: 01/17/2021 CLINICAL DATA:  Pneumonia. EXAM: PORTABLE CHEST 1 VIEW COMPARISON:  Chest x-ray dated January 11, 2021. FINDINGS: Unchanged enteric tube. The heart size and mediastinal contours are within normal limits. Normal pulmonary vascularity. New hazy density at both lung bases with blunting of the costophrenic angles. No pneumothorax. No acute osseous abnormality. IMPRESSION: 1. New layering small bilateral pleural effusions with adjacent bibasilar atelectasis. Electronically Signed   By: Titus Dubin M.D.   On: 01/17/2021 09:04     Scheduled Meds:  Chlorhexidine Gluconate Cloth  6 each Topical Daily   enoxaparin (LOVENOX) injection  40 mg Subcutaneous Q24H   furosemide  40 mg Oral Once   levothyroxine  25 mcg Intravenous Daily   magic mouthwash  5 mL Oral QID   pantoprazole (PROTONIX) IV  40 mg Intravenous Q24H   Continuous Infusions:  lactated ringers with kcl 125 mL/hr at 01/17/21 1050   methocarbamol (ROBAXIN) IV 500 mg (01/17/21 1437)     LOS: 6 days   Time spent: [place], MD [facility] To contact the attending provider between 7A-7P or the covering provider during after hours 7P-7A, please log into the web site [email or web address] and access using universal [place] password for that web site. If you do not have the password, please call the hospital operator.  01/17/2021, 5:40 PM

## 2021-01-18 DIAGNOSIS — K562 Volvulus: Secondary | ICD-10-CM | POA: Diagnosis not present

## 2021-01-18 LAB — BASIC METABOLIC PANEL
Anion gap: 9 (ref 5–15)
BUN: <5 mg/dL — ABNORMAL LOW (ref 8–23)
CO2: 27 mmol/L (ref 22–32)
Calcium: 7.7 mg/dL — ABNORMAL LOW (ref 8.9–10.3)
Chloride: 100 mmol/L (ref 98–111)
Creatinine, Ser: 0.36 mg/dL — ABNORMAL LOW (ref 0.44–1.00)
GFR, Estimated: >60 mL/min (ref >60)
Glucose, Bld: 95 mg/dL (ref 70–99)
Potassium: 2.8 mmol/L — ABNORMAL LOW (ref 3.5–5.1)
Sodium: 136 mmol/L (ref 135–145)

## 2021-01-18 LAB — MAGNESIUM: Magnesium: 1.9 mg/dL (ref 1.7–2.4)

## 2021-01-18 LAB — GLUCOSE, CAPILLARY
Glucose-Capillary: 102 mg/dL — ABNORMAL HIGH (ref 70–99)
Glucose-Capillary: 115 mg/dL — ABNORMAL HIGH (ref 70–99)
Glucose-Capillary: 73 mg/dL (ref 70–99)
Glucose-Capillary: 91 mg/dL (ref 70–99)
Glucose-Capillary: 97 mg/dL (ref 70–99)

## 2021-01-18 MED ORDER — PREDNISONE 5 MG PO TABS
50.0000 mg | ORAL_TABLET | Freq: Four times a day (QID) | ORAL | Status: DC
  Administered 2021-01-18: 50 mg via ORAL
  Filled 2021-01-18 (×2): qty 2

## 2021-01-18 MED ORDER — DIPHENHYDRAMINE HCL 50 MG/ML IJ SOLN
50.0000 mg | Freq: Once | INTRAMUSCULAR | Status: AC
  Administered 2021-01-19: 50 mg via INTRAVENOUS
  Filled 2021-01-18: qty 1

## 2021-01-18 MED ORDER — CLOTRIMAZOLE 1 % VA CREA
1.0000 | TOPICAL_CREAM | Freq: Every day | VAGINAL | Status: DC
  Administered 2021-01-18 – 2021-01-21 (×4): 1 via VAGINAL
  Filled 2021-01-18 (×2): qty 45

## 2021-01-18 MED ORDER — DIPHENHYDRAMINE HCL 25 MG PO CAPS
50.0000 mg | ORAL_CAPSULE | Freq: Once | ORAL | Status: AC
Start: 2021-01-18 — End: 2021-01-19

## 2021-01-18 MED ORDER — IOHEXOL 9 MG/ML PO SOLN: 1000.0000 mL | ORAL | Status: AC

## 2021-01-18 MED ORDER — IOHEXOL 9 MG/ML PO SOLN
ORAL | Status: AC
  Administered 2021-01-19: 500 mL via ORAL
  Filled 2021-01-18: qty 1000

## 2021-01-18 MED ORDER — POTASSIUM CHLORIDE 20 MEQ PO PACK
60.0000 meq | PACK | Freq: Two times a day (BID) | ORAL | Status: DC
  Administered 2021-01-18: 60 meq via ORAL
  Filled 2021-01-18 (×5): qty 3

## 2021-01-18 MED ORDER — POTASSIUM CHLORIDE 10 MEQ/100ML IV SOLN
10.0000 meq | INTRAVENOUS | Status: AC
  Administered 2021-01-18 (×3): 10 meq via INTRAVENOUS
  Filled 2021-01-18 (×2): qty 100

## 2021-01-18 NOTE — Progress Notes (Signed)
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 generalized abdominal pain-found to have small bowel obstruction on arrival Manage conservatively until 8/16 then underwent laparoscopic lysis of adhesions and small bowel resection with colon serosal repair  NG tube placed and has prolonged ileus  Developed labial swelling--clamping trials in process  Hospital-Problem based course  Small bowel obstruction status post laparoscopic repair Postop ileus Postop per general surgery--now on ice chips--clamping trials etc and pain control per surgeon L Labial swelling Swelling ?, add Clotrimazole at her report of odour--no discharge notedMay need GYN input Pleural effusion--atch for fever--CXR 8/20shows atelectasis--needs IS teachingMobilise and careful with ICE chips--d/w RN HTN, sinus tach Heart rate suncontrolled-discontinue Vasotec-continue metoprolol IV Reevaluate and adjust as needed Start po meds if ok by surgery HFpEF [id number] grade 1 diastolic dysfunction [id number] Maxide on hold Give lasix 40 x 1 given overall anasarca and swelling improved Hypokalemia give LR +40 of K at 40cc/h--add 5 runs of K Magnesium improved recheck in am Hypothyroid Replacing Synthroid 25 mcg IV Fibromyalgia/depression Resume Xanax 0.5 3 times daily hydroxyzine 25 3 times daily when able   DVT prophylaxis: Lovenox Code Status: Full Family Communication: patient updated alone Disposition:  Status is: Inpatient  Remains inpatient appropriate because:Hemodynamically unstable  Dispo: The patient is from: Home              Anticipated d/c is to: Home              Patient currently is not medically stable to d/c.   Difficult to place patient No       Consultants:   General surgery  Procedures: Laparoscopy 8/16  Antimicrobials: Perioperative   Subjective:  Still swollen overall--looks like clamping trials in process Labial swelling still + Some odour per patient also Asking for toileting from NT etc. Overall improved and more mobile, but still very weak compared to prior   Objective: Vitals:   01/17/21 0527 01/17/21 1351 01/17/21 2215 01/18/21 0539  BP: (!) 145/66 (!) 155/75 (!) 160/84 (!) 161/74  Pulse: (!) 105 94 (!) 103 100  Resp: '20 15 16 19  '$ Temp: 98.9 F (37.2 C) 99 F (37.2 C) 98 F (36.7 C) 98.2 F (36.8 C)  TempSrc: Oral Oral Oral Oral  SpO2: 97% 98% 94% 98%  Weight:      Height:        Intake/Output Summary (Last 24 hours) at 01/18/2021 1245 Last data filed at 01/18/2021 1046 Gross per 24 hour  Intake 1737.19 ml  Output 1650 ml  Net 87.19 ml    Filed Weights   01/11/21 1132  Weight: 57.2 kg    Examination:  rom intact Abd soft -heating pad on surface--dressing intact still no opvert pain over incision dressing Anasarca still L Labial swelling with mild discoloration--swelling seems diminished Digital bimanual exam deferred Neuro intact moving limbs x 4  Data Reviewed: personally reviewed   CBC    Component Value Date/Time   WBC 14.7 (H) 01/17/2021 0431   RBC 2.67 (L) 01/17/2021 0431   HGB 9.0 (L) 01/17/2021 0431   HCT 27.2 (L) 01/17/2021 0431   PLT 247 01/17/2021 0431   MCV 101.9 (H) 01/17/2021 0431   MCH 33.7 01/17/2021 0431   MCHC 33.1 01/17/2021 0431   RDW 13.2 01/17/2021 0431  LYMPHSABS 1.6 01/16/2021 0529   MONOABS 2.0 (H) 01/16/2021 0529   EOSABS 0.1 01/16/2021 0529   BASOSABS 0.1 01/16/2021 0529   CMP Latest Ref Rng & Units 01/18/2021 01/17/2021 01/16/2021  Glucose 70 - 99 mg/dL 95 85 104(H)  BUN 8 - 23 mg/dL <5(L) 5(L) 9  Creatinine 0.44 - 1.00 mg/dL 0.36(L) 0.41(L) 0.48  Sodium 135 - 145 mmol/L 136 137 139  Potassium 3.5 - 5.1 mmol/L 2.8(L) 2.8(L) 2.8(L)  Chloride 98 - 111 mmol/L  100 105 102  CO2 22 - 32 mmol/L '27 23 26  '$ Calcium 8.9 - 10.3 mg/dL 7.7(L) 7.7(L) 8.1(L)  Total Protein 6.5 - 8.1 g/dL - - 5.1(L)  Total Bilirubin 0.3 - 1.2 mg/dL - - 0.7  Alkaline Phos 38 - 126 U/L - - 45  AST 15 - 41 U/L - - 16  ALT 0 - 44 U/L - - 13     Radiology Studies: DG CHEST PORT 1 VIEW  Result Date: 01/17/2021 CLINICAL DATA:  Pneumonia. EXAM: PORTABLE CHEST 1 VIEW COMPARISON:  Chest x-ray dated January 11, 2021. FINDINGS: Unchanged enteric tube. The heart size and mediastinal contours are within normal limits. Normal pulmonary vascularity. New hazy density at both lung bases with blunting of the costophrenic angles. No pneumothorax. No acute osseous abnormality. IMPRESSION: 1. New layering small bilateral pleural effusions with adjacent bibasilar atelectasis. Electronically Signed   By: Titus Dubin M.D.   On: 01/17/2021 09:04     Scheduled Meds:  Chlorhexidine Gluconate Cloth  6 each Topical Daily   clotrimazole  1 Applicatorful Vaginal QHS   enoxaparin (LOVENOX) injection  40 mg Subcutaneous Q24H   furosemide  40 mg Oral Once   levothyroxine  25 mcg Intravenous Daily   magic mouthwash  5 mL Oral QID   pantoprazole (PROTONIX) IV  40 mg Intravenous Q24H   Continuous Infusions:  lactated ringers with kcl 40 mL/hr at 01/18/21 0237   methocarbamol (ROBAXIN) IV 500 mg (01/18/21 0930)   potassium chloride       LOS: 7 days   Time spent: [place], MD [facility] To contact the attending provider between 7A-7P or the covering provider during after hours 7P-7A, please log into the web site [email or web address] and access using universal [place] password for that web site. If you do not have the password, please call the hospital operator.  01/18/2021, 12:45 PM

## 2021-01-18 NOTE — Progress Notes (Signed)
4 Days Post-Op   Subjective/Chief Complaint: Complains of soreness. Says she is passing gas   Objective: Vital signs in last 24 hours: Temp:  [98 F (36.7 C)-99 F (37.2 C)] 98.2 F (36.8 C) (08/20 0539) Pulse Rate:  [94-103] 100 (08/20 0539) Resp:  [15-19] 19 (08/20 0539) BP: (155-161)/(74-84) 161/74 (08/20 0539) SpO2:  [94 %-98 %] 98 % (08/20 0539) Last BM Date: 01/10/21  Intake/Output from previous day: 08/19 0701 - 08/20 0700 In: 2143.5 [P.O.:480; I.V.:1126.1; IV Piggyback:537.5] Out: 1700 [Urine:1050; Emesis/NG output:650] Intake/Output this shift: No intake/output data recorded.  General appearance: alert and cooperative Resp: clear to auscultation bilaterally Cardio: regular rate and rhythm GI: soft, moderate tenderness. Incision good. Good bs  Lab Results:  Recent Labs    01/16/21 0529 01/17/21 0431  WBC 13.9* 14.7*  HGB 10.3* 9.0*  HCT 30.3* 27.2*  PLT 250 247   BMET Recent Labs    01/17/21 0431 01/18/21 0442  NA 137 136  K 2.8* 2.8*  CL 105 100  CO2 23 27  GLUCOSE 85 95  BUN 5* <5*  CREATININE 0.41* 0.36*  CALCIUM 7.7* 7.7*   PT/INR No results for input(s): LABPROT, INR in the last 72 hours. ABG No results for input(s): PHART, HCO3 in the last 72 hours.  Invalid input(s): PCO2, PO2  Studies/Results: DG CHEST PORT 1 VIEW  Result Date: 01/17/2021 CLINICAL DATA:  Pneumonia. EXAM: PORTABLE CHEST 1 VIEW COMPARISON:  Chest x-ray dated January 11, 2021. FINDINGS: Unchanged enteric tube. The heart size and mediastinal contours are within normal limits. Normal pulmonary vascularity. New hazy density at both lung bases with blunting of the costophrenic angles. No pneumothorax. No acute osseous abnormality. IMPRESSION: 1. New layering small bilateral pleural effusions with adjacent bibasilar atelectasis. Electronically Signed   By: Titus Dubin M.D.   On: 01/17/2021 09:04    Anti-infectives: Anti-infectives (From admission, onward)    Start      Dose/Rate Route Frequency Ordered Stop   01/15/21 0600  cefoTEtan (CEFOTAN) 2 g in sodium chloride 0.9 % 100 mL IVPB  Status:  Discontinued       Note to Pharmacy: Pharmacy may adjust dose strength for optimal dosing.   Send with patient on call to the OR.  Anest[facility] to complete antibiotic administration [facility]   2 g 200 mL/hr over 30 Minutes Intravenous On call to O.R. 01/14/21 1216 01/14/21 1219   01/14/21 2200  cefoTEtan (CEFOTAN) 2 g in sodium chloride 0.9 % 100 mL IVPB        2 g 200 mL/hr over 30 Minutes Intravenous Every 12 hours 01/14/21 1632 01/14/21 2149   01/14/21 1215  cefoTEtan (CEFOTAN) 2 g in sodium chloride 0.9 % 100 mL IVPB        2 g 200 mL/hr over 30 Minutes Intravenous On call to O.R. 01/14/21 1115 01/14/21 1353       Assessment/Plan: s/p Procedure(s): LAPAROSCOPY DIAGNOSTIC, LYSIS OF ADHESIONS, laparoscopic assisted open small bowel resection. (N/A) Continue bowel rest Will try clamping ng today. If no nausea or bloating after several hours then will try removing K 2.8. will need to be replaced Ambulate POD 4  LOS: 7 days    PAutumn MessingIII 01/18/2021

## 2021-01-18 NOTE — Plan of Care (Signed)
  Problem: Safety: Goal: Ability to remain free from injury will improve Outcome: Progressing   Problem: Pain Managment: Goal: General experience of comfort will improve Outcome: Progressing   Problem: Elimination: Goal: Will not experience complications related to bowel motility Outcome: Progressing Goal: Will not experience complications related to urinary retention Outcome: Progressing   Problem: Coping: Goal: Level of anxiety will decrease Outcome: Progressing

## 2021-01-19 ENCOUNTER — Inpatient Hospital Stay (HOSPITAL_COMMUNITY): Payer: Medicare Other

## 2021-01-19 DIAGNOSIS — K562 Volvulus: Secondary | ICD-10-CM | POA: Diagnosis not present

## 2021-01-19 LAB — CBC
HCT: 32.2 % — ABNORMAL LOW (ref 36.0–46.0)
Hemoglobin: 10.9 g/dL — ABNORMAL LOW (ref 12.0–15.0)
MCH: 33.4 pg (ref 26.0–34.0)
MCHC: 33.9 g/dL (ref 30.0–36.0)
MCV: 98.8 fL (ref 80.0–100.0)
Platelets: 363 10*3/uL (ref 150–400)
RBC: 3.26 MIL/uL — ABNORMAL LOW (ref 3.87–5.11)
RDW: 13.1 % (ref 11.5–15.5)
WBC: 14.8 10*3/uL — ABNORMAL HIGH (ref 4.0–10.5)
nRBC: 0 % (ref 0.0–0.2)

## 2021-01-19 LAB — BASIC METABOLIC PANEL
Anion gap: 11 (ref 5–15)
BUN: <5 mg/dL — ABNORMAL LOW (ref 8–23)
CO2: 27 mmol/L (ref 22–32)
Calcium: 8.2 mg/dL — ABNORMAL LOW (ref 8.9–10.3)
Chloride: 97 mmol/L — ABNORMAL LOW (ref 98–111)
Creatinine, Ser: 0.38 mg/dL — ABNORMAL LOW (ref 0.44–1.00)
GFR, Estimated: >60 mL/min (ref >60)
Glucose, Bld: 147 mg/dL — ABNORMAL HIGH (ref 70–99)
Potassium: 3.6 mmol/L (ref 3.5–5.1)
Sodium: 135 mmol/L (ref 135–145)

## 2021-01-19 LAB — GLUCOSE, CAPILLARY
Glucose-Capillary: 105 mg/dL — ABNORMAL HIGH (ref 70–99)
Glucose-Capillary: 108 mg/dL — ABNORMAL HIGH (ref 70–99)
Glucose-Capillary: 131 mg/dL — ABNORMAL HIGH (ref 70–99)
Glucose-Capillary: 153 mg/dL — ABNORMAL HIGH (ref 70–99)

## 2021-01-19 MED ORDER — IOHEXOL 350 MG/ML SOLN
80.0000 mL | Freq: Once | INTRAVENOUS | Status: AC | PRN
  Administered 2021-01-19: 80 mL via INTRAVENOUS

## 2021-01-19 MED ORDER — LEVOTHYROXINE SODIUM 50 MCG PO TABS
50.0000 ug | ORAL_TABLET | Freq: Every day | ORAL | Status: DC
  Administered 2021-01-20 – 2021-01-21 (×2): 50 ug via ORAL
  Filled 2021-01-19 (×2): qty 1

## 2021-01-19 MED ORDER — FUROSEMIDE 20 MG PO TABS
20.0000 mg | ORAL_TABLET | Freq: Two times a day (BID) | ORAL | Status: DC
  Administered 2021-01-19 – 2021-01-20 (×2): 20 mg via ORAL
  Filled 2021-01-19 (×2): qty 1

## 2021-01-19 NOTE — Progress Notes (Signed)
   01/18/21 2202  Vitals  Temp 98.1 F (36.7 C)  Temp Source Oral  BP (!) 159/86  MAP (mmHg) 105  BP Location Right Arm  BP Method Automatic  Patient Position (if appropriate) Lying  Pulse Rate (!) 108  Resp 18  MEWS COLOR  MEWS Score Color Green  Oxygen Therapy  SpO2 96 %  O2 Device Room Air  MEWS Score  MEWS Temp 0  MEWS Systolic 0  MEWS Pulse 1  MEWS RR 0  MEWS LOC 0  MEWS Score 1

## 2021-01-19 NOTE — Progress Notes (Signed)
5 Days Post-Op   Subjective/Chief Complaint: Complains of abd pain but seems to be improving. Ng out and tolerating clears. Passing flatus and having bm's   Objective: Vital signs in last 24 hours: Temp:  [97.7 F (36.5 C)-98.5 F (36.9 C)] 97.7 F (36.5 C) (08/21 0628) Pulse Rate:  [97-108] 97 (08/21 0628) Resp:  [18] 18 (08/21 0628) BP: (153-180)/(79-87) 153/81 (08/21 0628) SpO2:  [94 %-98 %] 97 % (08/21 0628) Last BM Date: 01/10/21  Intake/Output from previous day: 08/20 0701 - 08/21 0700 In: 1730.5 [P.O.:330; I.V.:1081.1; IV Piggyback:319.4] Out: 950 [Urine:850; Emesis/NG output:100] Intake/Output this shift: No intake/output data recorded.  General appearance: alert and cooperative Resp: clear to auscultation bilaterally Cardio: regular rate and rhythm GI: soft, moderate tenderness. Good bs. Incision ok  Lab Results:  Recent Labs    01/17/21 0431 01/19/21 0351  WBC 14.7* 14.8*  HGB 9.0* 10.9*  HCT 27.2* 32.2*  PLT 247 363   BMET Recent Labs    01/18/21 0442 01/19/21 0351  NA 136 135  K 2.8* 3.6  CL 100 97*  CO2 27 27  GLUCOSE 95 147*  BUN <5* <5*  CREATININE 0.36* 0.38*  CALCIUM 7.7* 8.2*   PT/INR No results for input(s): LABPROT, INR in the last 72 hours. ABG No results for input(s): PHART, HCO3 in the last 72 hours.  Invalid input(s): PCO2, PO2  Studies/Results: No results found.  Anti-infectives: Anti-infectives (From admission, onward)    Start     Dose/Rate Route Frequency Ordered Stop   01/15/21 0600  cefoTEtan (CEFOTAN) 2 g in sodium chloride 0.9 % 100 mL IVPB  Status:  Discontinued       Note to Pharmacy: Pharmacy may adjust dose strength for optimal dosing.   Send with patient on call to the OR.  Anest[facility] to complete antibiotic administration [facility]74mn [facility]   2 g 200 mL/hr over 30 Minutes Intravenous On call to O.R. 01/14/21 1216 01/14/21 1219   01/14/21 2200  cefoTEtan (CEFOTAN) 2 g in sodium  chloride 0.9 % 100 mL IVPB        2 g 200 mL/hr over 30 Minutes Intravenous Every 12 hours 01/14/21 1632 01/14/21 2149   01/14/21 1215  cefoTEtan (CEFOTAN) 2 g in sodium chloride 0.9 % 100 mL IVPB        2 g 200 mL/hr over 30 Minutes Intravenous On call to O.R. 01/14/21 1115 01/14/21 1353       Assessment/Plan: s/p Procedure(s): LAPAROSCOPY DIAGNOSTIC, LYSIS OF ADHESIONS, laparoscopic assisted open small bowel resection. (N/A) Continue clears for now CLOSED LOOP STRANGULATED SMALL BOWEL OBSTRUCTION  - POD 4 s/p LAPAROSCOPIC LYSIS OF ADHESIONS, SMALL BOWEL RESECTION, COLON SEROSAL REPAIR Dr GJohney Maine8/16 - passing flatus and NGT out - pain control improved on regimen of scheduled robaxin, tylenol, shortened hydromorphone interval, ice/heat - encouraged OOB and ambulation - monitor leukocytosis   FEN: NGT LIWS, IVF, replete Mag/P/K VTE: lovenox ID: [email or web address] periop   -Hypothyroidism, chronic pain, anxiety, GERD, etc. Per primary service  LOS: 8 days    PAutumn MessingIII 01/19/2021

## 2021-01-19 NOTE — Progress Notes (Signed)
PROGRESS NOTE   Laura Barnes  [id number] DOB: [birth year] DOA: 01/11/2021 PCP: Binnie Rail, MD  Brief Narrative:  80 year old white female Known history of anxiety hypothyroid HTN Allergies NSAID induced GI bleed fibromyalgia grade 1 diastolic dysfunction IBS admit 01/11/2021 generalized abdominal pain-found to have small bowel obstruction on arrival Manage conservatively until 8/16 then underwent laparoscopic lysis of adhesions and small bowel resection with colon serosal repair  NG tube placed and has prolonged ileus  Developed labial swelling--clamping trials in process  Hospital-Problem based course  Small bowel obstruction status post laparoscopic repair Postop ileus Postop per general surgery--now on ice chips--clamping trials etc and pain control per surgeon L Labial swelling Swelling ?, add Clotrimazole at her report of odour--no discharge-D/W Dr. Lynnda Shields of gynecology and he reviewed the case and suggested a CT pelvis-CT pelvis returned showing no specific mass or abscess in her labial area so we will just treat this empirically at this time and treat this as if this is a component of her anasarca Pleural effusion---CXR 8/20 shows atelectasis--needs IS teaching Mobilise and careful with ICE chips HTN, sinus tach Heart rate uncontrolled-discontinue Vasotec-continue metoprolol IV Reevaluate and adjust as needed Start po meds if ok by surgery HFpEF 123456 grade 1 diastolic dysfunction [id number] Maxide on hold Give lasix 40 x 1 given overall anasarca and swelling improved Give Lasix 20 p.o. bid Hypokalemia give LR +40 of K at 40cc/h--add 5 runs of K--potassium is slowly improving with replacement Magnesium check in a.m. Hypothyroid Replacing Synthroid 25 mcg IV Fibromyalgia/depression Resume Xanax 0.5 3 times daily hydroxyzine 25 3 times daily when able   DVT prophylaxis: Lovenox Code Status: Full Family Communication: patient updated alone Disposition:   Status is: Inpatient  Remains inpatient appropriate because:Hemodynamically unstable  Dispo: The patient is from: Home              Anticipated d/c is to: Home              Patient currently is not medically stable to d/c.   Difficult to place patient No       Consultants:  General surgery  Procedures: Laparoscopy 8/16  Antimicrobials: Perioperative   Subjective:  Still swollen overall--looks like clamping trials in process Labial swelling still + Some odour per patient also Asking for toileting from NT etc. Overall improved and more mobile, but still very weak compared to prior   Objective: Vitals:   01/18/21 2202 01/19/21 0215 01/19/21 0628 01/19/21 1342  BP: (!) 159/86 (!) 167/87 (!) 153/81 (!) 168/76  Pulse: (!) 108 98 97 (!) 101  Resp: '18 18 18 18  '$ Temp: 98.1 F (36.7 C) 98.5 F (36.9 C) 97.7 F (36.5 C) 98.8 F (37.1 C)  TempSrc: Oral Oral Oral Oral  SpO2: 96% 96% 97% 92%  Weight:      Height:        Intake/Output Summary (Last 24 hours) at 01/19/2021 1537 Last data filed at 01/19/2021 1400 Gross per 24 hour  Intake 2324.22 ml  Output 250 ml  Net 2074.22 ml    Filed Weights   01/11/21 1132  Weight: 57.2 kg    Examination:  rom intact Abd soft -heating pad on surface--dressing intact still no overt pain over incision dressing Anasarca still L Labial swelling with mild discoloration--swelling seems diminished Digital bimanual exam deferred Neuro intact moving limbs x 4  Data Reviewed: personally reviewed   CBC    Component Value Date/Time   WBC 14.8 (H) 01/19/2021 [id number]  RBC 3.26 (L) 01/19/2021 0351   HGB 10.9 (L) 01/19/2021 0351   HCT 32.2 (L) 01/19/2021 0351   PLT 363 01/19/2021 0351   MCV 98.8 01/19/2021 0351   MCH 33.4 01/19/2021 0351   MCHC 33.9 01/19/2021 0351   RDW 13.1 01/19/2021 0351   LYMPHSABS 1.6 01/16/2021 0529   MONOABS 2.0 (H) 01/16/2021 0529   EOSABS 0.1 01/16/2021 0529   BASOSABS 0.1 01/16/2021 0529   CMP  Latest Ref Rng & Units 01/19/2021 01/18/2021 01/17/2021  Glucose 70 - 99 mg/dL 147(H) 95 85  BUN 8 - 23 mg/dL <5(L) <5(L) 5(L)  Creatinine 0.44 - 1.00 mg/dL 0.38(L) 0.36(L) 0.41(L)  Sodium 135 - 145 mmol/L 135 136 137  Potassium 3.5 - 5.1 mmol/L 3.6 2.8(L) 2.8(L)  Chloride 98 - 111 mmol/L 97(L) 100 105  CO2 22 - 32 mmol/L '27 27 23  '$ Calcium 8.9 - 10.3 mg/dL 8.2(L) 7.7(L) 7.7(L)  Total Protein 6.5 - 8.1 g/dL - - -  Total Bilirubin 0.3 - 1.2 mg/dL - - -  Alkaline Phos 38 - 126 U/L - - -  AST 15 - 41 U/L - - -  ALT 0 - 44 U/L - - -     Radiology Studies: CT ABDOMEN PELVIS W CONTRAST  Addendum Date: 01/19/2021   ADDENDUM REPORT: 01/19/2021 14:13 ADDENDUM: Although the most inferior aspect of the labia was excluded from the field of view, which would include the skin and a small amount of subcutaneous soft tissue, the vast majority of the perineum is visualized. There is no evidence of an abscess or significant inflammation. Electronically Signed   By: Lajean Manes M.D.   On: 01/19/2021 14:13   Result Date: 01/19/2021 CLINICAL DATA:  Abdominal pain and swelling. Status post laparoscopy on 01/14/2021 with lysis of adhesions and small bowel resection. EXAM: CT ABDOMEN AND PELVIS WITH CONTRAST TECHNIQUE: Multidetector CT imaging of the abdomen and pelvis was performed using the standard protocol following bolus administration of intravenous contrast. CONTRAST:  71m OMNIPAQUE IOHEXOL 350 MG/ML SOLN COMPARISON:  01/11/2021 FINDINGS: Lower chest: Small, right greater than left, pleural effusions. Associated dependent lower lobe opacity consistent with atelectasis. Hepatobiliary: Normal liver. Status post cholecystectomy. Mild intra and extrahepatic bile duct dilation. Common bile duct with maximum diameter of 9 mm, demonstrating distal tapering. Findings are similar to the prior CT. Pancreas: No mass or inflammation.  No duct dilation. Spleen: Normal in size without focal abnormality. Adrenals/Urinary  Tract: No adrenal masses. Kidneys normal size, orientation and position with symmetric enhancement and excretion. Small low-attenuation area along the posterior upper pole the right kidney. This may reflect an area of prior scarring or a small cyst or a combination. No other renal masses or lesions. No stones. No hydronephrosis. Normal ureters. Bladder is mildly distended. No wall thickening or mass. No stone. Non dependent air noted in the anterior bladder. Stomach/Bowel: Stomach is decompressed, otherwise unremarkable. Small bowel is normal in caliber. No wall thickening. There is a decompressed loop of small bowel in the left mid abdomen where there is a small bowel anastomosis staple line. This is surrounded by hazy increased attenuation in the mesenteric fat. There is no extraluminal air. No defined fluid collection to suggest an abscess. Left colon is decompressed. Right colon normal in caliber. No colonic wall thickening or evidence of inflammation. Scattered sigmoid colon diverticula. Vascular/Lymphatic: Mild aortic atherosclerosis. No aneurysm. Vascular structures otherwise unremarkable. No enlarged lymph nodes. Reproductive: Status post hysterectomy. No adnexal masses. Other: Small areas of subcutaneous  air noted along the right mid abdomen, in the lateral to the left ilium. Small amount of air adjacent to the rectus abdominus muscle on the right, in the lower abdomen, and rectus abdominus muscle in the upper abdomen on the left. These areas of air have presumably tract from the laparoscopic insertion sites. No abdominal wall hernia. No ascites. Diffuse subcutaneous edema extends from the mid abdomen to pelvis proximal lower extremities. Musculoskeletal: No fracture or acute finding.  No bone lesion. IMPRESSION: 1. No evidence of bowel obstruction. 2. Hazy opacity within the fat adjacent to a small bowel anastomosis in the left mid abdomen. This is consistent with postoperative edema. No defined fluid  collection to suggest an abscess. 3. No ascites or free intraperitoneal air. 4. Bilateral pleural effusions, dependent lower lobe atelectasis and diffuse subcutaneous edema in the mid to lower abdomen and pelvis. Electronically Signed: By: Lajean Manes M.D. On: 01/19/2021 12:33     Scheduled Meds:  Chlorhexidine Gluconate Cloth  6 each Topical Daily   clotrimazole  1 Applicatorful Vaginal QHS   enoxaparin (LOVENOX) injection  40 mg Subcutaneous Q24H   furosemide  40 mg Oral Once   levothyroxine  25 mcg Intravenous Daily   magic mouthwash  5 mL Oral QID   pantoprazole (PROTONIX) IV  40 mg Intravenous Q24H   potassium chloride  60 mEq Oral BID   Continuous Infusions:  lactated ringers with kcl 40 mL/hr at 01/19/21 0409   methocarbamol (ROBAXIN) IV 500 mg (01/19/21 1345)     LOS: 8 days   Time spent: [place], MD [facility] To contact the attending provider between 7A-7P or the covering provider during after hours 7P-7A, please log into the web site [email or web address] and access using universal [place] password for that web site. If you do not have the password, please call the hospital operator.  01/19/2021, 3:37 PM

## 2021-01-19 NOTE — Progress Notes (Signed)
Pt had order for prednisone 50 x3  dose and benadryl x1 before her Ct-scan in the morning. Patient took her first dose of prednisone at 2123, after she took her medicine, pt states that she was allergic with the prednisone before. Notified to pharmacist and notified to on-call NP. No new order received. Continue to monitor patient, there is no any s/s of allergic at this time. Checked V/S: B/P-159/86,T-98.1,Hr-108,R-18,O2 sats-96% with RA.

## 2021-01-20 DIAGNOSIS — K562 Volvulus: Secondary | ICD-10-CM | POA: Diagnosis not present

## 2021-01-20 LAB — CBC WITH DIFFERENTIAL/PLATELET
Abs Immature Granulocytes: 1.49 10*3/uL — ABNORMAL HIGH (ref 0.00–0.07)
Basophils Absolute: 0 10*3/uL (ref 0.0–0.1)
Basophils Relative: 0 %
Eosinophils Absolute: 0.3 10*3/uL (ref 0.0–0.5)
Eosinophils Relative: 2 %
HCT: 30.6 % — ABNORMAL LOW (ref 36.0–46.0)
Hemoglobin: 10.7 g/dL — ABNORMAL LOW (ref 12.0–15.0)
Immature Granulocytes: 9 %
Lymphocytes Relative: 13 %
Lymphs Abs: 2.2 10*3/uL (ref 0.7–4.0)
MCH: 33.9 pg (ref 26.0–34.0)
MCHC: 35 g/dL (ref 30.0–36.0)
MCV: 96.8 fL (ref 80.0–100.0)
Monocytes Absolute: 1.9 10*3/uL — ABNORMAL HIGH (ref 0.1–1.0)
Monocytes Relative: 11 %
Neutro Abs: 11.7 10*3/uL — ABNORMAL HIGH (ref 1.7–7.7)
Neutrophils Relative %: 65 %
Platelets: 387 10*3/uL (ref 150–400)
RBC: 3.16 MIL/uL — ABNORMAL LOW (ref 3.87–5.11)
RDW: 13.2 % (ref 11.5–15.5)
WBC: 17.6 10*3/uL — ABNORMAL HIGH (ref 4.0–10.5)
nRBC: 0 % (ref 0.0–0.2)

## 2021-01-20 LAB — COMPREHENSIVE METABOLIC PANEL
ALT: 18 U/L (ref 0–44)
AST: 24 U/L (ref 15–41)
Albumin: 2.7 g/dL — ABNORMAL LOW (ref 3.5–5.0)
Alkaline Phosphatase: 49 U/L (ref 38–126)
Anion gap: 9 (ref 5–15)
BUN: <5 mg/dL — ABNORMAL LOW (ref 8–23)
CO2: 31 mmol/L (ref 22–32)
Calcium: 8.7 mg/dL — ABNORMAL LOW (ref 8.9–10.3)
Chloride: 98 mmol/L (ref 98–111)
Creatinine, Ser: 0.38 mg/dL — ABNORMAL LOW (ref 0.44–1.00)
GFR, Estimated: >60 mL/min (ref >60)
Glucose, Bld: 119 mg/dL — ABNORMAL HIGH (ref 70–99)
Potassium: 3 mmol/L — ABNORMAL LOW (ref 3.5–5.1)
Sodium: 138 mmol/L (ref 135–145)
Total Bilirubin: 0.8 mg/dL (ref 0.3–1.2)
Total Protein: 5.7 g/dL — ABNORMAL LOW (ref 6.5–8.1)

## 2021-01-20 LAB — GLUCOSE, CAPILLARY
Glucose-Capillary: 96 mg/dL (ref 70–99)
Glucose-Capillary: 147 mg/dL — ABNORMAL HIGH (ref 70–99)

## 2021-01-20 LAB — MAGNESIUM: Magnesium: 1.5 mg/dL — ABNORMAL LOW (ref 1.7–2.4)

## 2021-01-20 MED ORDER — POTASSIUM CHLORIDE 20 MEQ PO PACK
80.0000 meq | PACK | Freq: Two times a day (BID) | ORAL | Status: DC
  Filled 2021-01-20 (×4): qty 4

## 2021-01-20 MED ORDER — FUROSEMIDE 10 MG/ML IJ SOLN
60.0000 mg | Freq: Once | INTRAMUSCULAR | Status: AC
  Administered 2021-01-20: 60 mg via INTRAVENOUS
  Filled 2021-01-20: qty 6

## 2021-01-20 MED ORDER — ACETAMINOPHEN 500 MG PO TABS
500.0000 mg | ORAL_TABLET | Freq: Four times a day (QID) | ORAL | Status: DC | PRN
  Administered 2021-01-20: 500 mg via ORAL
  Filled 2021-01-20: qty 1

## 2021-01-20 MED ORDER — MAGNESIUM SULFATE 2 GM/50ML IV SOLN
2.0000 g | Freq: Once | INTRAVENOUS | Status: AC
  Administered 2021-01-20: 2 g via INTRAVENOUS
  Filled 2021-01-20: qty 50

## 2021-01-20 NOTE — Progress Notes (Signed)
PROGRESS NOTE   Laura Barnes  [id number] DOB: 1940-08-07 DOA: 01/11/2021 PCP: Binnie Rail, MD  Brief Narrative:  80 year old white female Known history of anxiety hypothyroid HTN Allergies NSAID induced GI bleed fibromyalgia grade 1 diastolic dysfunction IBS admit 01/11/2021 generalized abdominal pain-found to have small bowel obstruction on arrival Manage conservatively until 8/16 then underwent laparoscopic lysis of adhesions and small bowel resection with colon serosal repair  NG tube placed and has prolonged ileus  Developed labial swelling--clamping trials in process  Hospital-Problem based course  Small bowel obstruction status post laparoscopic repair Postop ileus Postop per general surgery L Labial swelling Swelling ?, add Clotrimazole at her report of odour--no discharge-D/W Dr. Lynnda Shields gynecology--we did CT pelvis-CT pelvis returned showing no specific mass or abscess in her labial area   Is much improved and no further work-up required Pleural effusion---CXR 8/20 shows atelectasis--needs IS teaching Mobilise and careful with ICE chips HTN, sinus tach Heart rate uncontrolled-discontinue Vasotec-continue metoprolol IV Start po meds if ok by surger when reliably taking p.o. well y HFpEF 123456 grade 1 diastolic dysfunction [id number] Maxide on hold Increased diuresis today to Lasix 60 daily and will increase it to probably twice daily in a.m. if manages to keep potassium up Hypokalemia give LR +40 of K at 40cc/h--continue oral potassium 80 twice a day in addition Give another 2 g of magnesium today Hypothyroid Replacing Synthroid 25 mcg IV Fibromyalgia/depression Resume Xanax 0.5 3 times daily hydroxyzine 25 3 times daily when able   DVT prophylaxis: Lovenox Code Status: Full Family Communication: patient updated alone Disposition:  Status is: Inpatient  Remains inpatient appropriate because:Hemodynamically unstable  Dispo: The patient is from:  Home              Anticipated d/c is to: Home              Patient currently is not medically stable to d/c.   Difficult to place patient No       Consultants:  General surgery  Procedures: Laparoscopy 8/16  Antimicrobials: Perioperative   Subjective:  Overall much improved Taking in some orals   Objective: Vitals:   01/19/21 2157 01/20/21 0600 01/20/21 0616 01/20/21 1318  BP: (!) 179/92 (!) 167/101  (!) 150/74  Pulse: 97 (!) 116 (!) 107 (!) 102  Resp: '16 18  17  '$ Temp: 98.2 F (36.8 C) 97.8 F (36.6 C)  98.7 F (37.1 C)  TempSrc: Oral Oral  Oral  SpO2: 96% 94% 96% 97%  Weight:      Height:        Intake/Output Summary (Last 24 hours) at 01/20/2021 1638 Last data filed at 01/20/2021 0200 Gross per 24 hour  Intake 777.21 ml  Output 1150 ml  Net -372.79 ml    Filed Weights   01/11/21 1132  Weight: 57.2 kg    Examination:  rom intact Anasarca still although slightly decreased Labial swelling much improved No significant abdominal pain on exam she seems less distended Chest clear no rales rhonchi S1-S2 sinus  Data Reviewed: personally reviewed   CBC    Component Value Date/Time   WBC 17.6 (H) 01/20/2021 0404   RBC 3.16 (L) 01/20/2021 0404   HGB 10.7 (L) 01/20/2021 0404   HCT 30.6 (L) 01/20/2021 0404   PLT 387 01/20/2021 0404   MCV 96.8 01/20/2021 0404   MCH 33.9 01/20/2021 0404   MCHC 35.0 01/20/2021 0404   RDW 13.2 01/20/2021 0404   LYMPHSABS 2.2 01/20/2021 0404  MONOABS 1.9 (H) 01/20/2021 0404   EOSABS 0.3 01/20/2021 0404   BASOSABS 0.0 01/20/2021 0404   CMP Latest Ref Rng & Units 01/20/2021 01/19/2021 01/18/2021  Glucose 70 - 99 mg/dL 119(H) 147(H) 95  BUN 8 - 23 mg/dL <5(L) <5(L) <5(L)  Creatinine 0.44 - 1.00 mg/dL 0.38(L) 0.38(L) 0.36(L)  Sodium 135 - 145 mmol/L 138 135 136  Potassium 3.5 - 5.1 mmol/L 3.0(L) 3.6 2.8(L)  Chloride 98 - 111 mmol/L 98 97(L) 100  CO2 22 - 32 mmol/L '31 27 27  '$ Calcium 8.9 - 10.3 mg/dL 8.7(L) 8.2(L) 7.7(L)   Total Protein 6.5 - 8.1 g/dL 5.7(L) - -  Total Bilirubin 0.3 - 1.2 mg/dL 0.8 - -  Alkaline Phos 38 - 126 U/L 49 - -  AST 15 - 41 U/L 24 - -  ALT 0 - 44 U/L 18 - -     Radiology Studies: CT ABDOMEN PELVIS W CONTRAST  Addendum Date: 01/19/2021   ADDENDUM REPORT: 01/19/2021 14:13 ADDENDUM: Although the most inferior aspect of the labia was excluded from the field of view, which would include the skin and a small amount of subcutaneous soft tissue, the vast majority of the perineum is visualized. There is no evidence of an abscess or significant inflammation. Electronically Signed   By: Lajean Manes M.D.   On: 01/19/2021 14:13   Result Date: 01/19/2021 CLINICAL DATA:  Abdominal pain and swelling. Status post laparoscopy on 01/14/2021 with lysis of adhesions and small bowel resection. EXAM: CT ABDOMEN AND PELVIS WITH CONTRAST TECHNIQUE: Multidetector CT imaging of the abdomen and pelvis was performed using the standard protocol following bolus administration of intravenous contrast. CONTRAST:  59m OMNIPAQUE IOHEXOL 350 MG/ML SOLN COMPARISON:  01/11/2021 FINDINGS: Lower chest: Small, right greater than left, pleural effusions. Associated dependent lower lobe opacity consistent with atelectasis. Hepatobiliary: Normal liver. Status post cholecystectomy. Mild intra and extrahepatic bile duct dilation. Common bile duct with maximum diameter of 9 mm, demonstrating distal tapering. Findings are similar to the prior CT. Pancreas: No mass or inflammation.  No duct dilation. Spleen: Normal in size without focal abnormality. Adrenals/Urinary Tract: No adrenal masses. Kidneys normal size, orientation and position with symmetric enhancement and excretion. Small low-attenuation area along the posterior upper pole the right kidney. This may reflect an area of prior scarring or a small cyst or a combination. No other renal masses or lesions. No stones. No hydronephrosis. Normal ureters. Bladder is mildly distended. No  wall thickening or mass. No stone. Non dependent air noted in the anterior bladder. Stomach/Bowel: Stomach is decompressed, otherwise unremarkable. Small bowel is normal in caliber. No wall thickening. There is a decompressed loop of small bowel in the left mid abdomen where there is a small bowel anastomosis staple line. This is surrounded by hazy increased attenuation in the mesenteric fat. There is no extraluminal air. No defined fluid collection to suggest an abscess. Left colon is decompressed. Right colon normal in caliber. No colonic wall thickening or evidence of inflammation. Scattered sigmoid colon diverticula. Vascular/Lymphatic: Mild aortic atherosclerosis. No aneurysm. Vascular structures otherwise unremarkable. No enlarged lymph nodes. Reproductive: Status post hysterectomy. No adnexal masses. Other: Small areas of subcutaneous air noted along the right mid abdomen, in the lateral to the left ilium. Small amount of air adjacent to the rectus abdominus muscle on the right, in the lower abdomen, and rectus abdominus muscle in the upper abdomen on the left. These areas of air have presumably tract from the laparoscopic insertion sites. No abdominal wall  hernia. No ascites. Diffuse subcutaneous edema extends from the mid abdomen to pelvis proximal lower extremities. Musculoskeletal: No fracture or acute finding.  No bone lesion. IMPRESSION: 1. No evidence of bowel obstruction. 2. Hazy opacity within the fat adjacent to a small bowel anastomosis in the left mid abdomen. This is consistent with postoperative edema. No defined fluid collection to suggest an abscess. 3. No ascites or free intraperitoneal air. 4. Bilateral pleural effusions, dependent lower lobe atelectasis and diffuse subcutaneous edema in the mid to lower abdomen and pelvis. Electronically Signed: By: Lajean Manes M.D. On: 01/19/2021 12:33     Scheduled Meds:  Chlorhexidine Gluconate Cloth  6 each Topical Daily   clotrimazole  1  Applicatorful Vaginal QHS   enoxaparin (LOVENOX) injection  40 mg Subcutaneous Q24H   levothyroxine  50 mcg Oral Q0600   magic mouthwash  5 mL Oral QID   pantoprazole (PROTONIX) IV  40 mg Intravenous Q24H   potassium chloride  80 mEq Oral BID   Continuous Infusions:  methocarbamol (ROBAXIN) IV 500 mg (01/20/21 1338)     LOS: 9 days   Time spent: [place], MD [facility] To contact the attending provider between 7A-7P or the covering provider during after hours 7P-7A, please log into the web site [email or web address] and access using universal [place] password for that web site. If you do not have the password, please call the hospital operator.  01/20/2021, 4:38 PM

## 2021-01-20 NOTE — Care Management Important Message (Signed)
Important Message  Patient Details IM Letter given to the Patient. Name: Laura Barnes MRN: [id number] Date of Birth: [birth year]   Medicare Important Message Given:  Yes     Kerin Salen 01/20/2021, 1:40 PM

## 2021-01-20 NOTE — Evaluation (Signed)
Physical Therapy Evaluation Patient Details Name: Laura Barnes MRN: [id number] DOB: 1940/07/19 Today's Date: 01/20/2021   History of Present Illness  80 yo female adm with NSAID induced GI bleed fibromyalgia grade 1 diastolic dysfunction IBS admit 01/11/2021 generalized abdominal pain-found to have small bowel obstruction on arrival  Manage conservatively until 8/16 then underwent laparoscopic lysis of adhesions and small bowel resection with colon serosal repair.NG tube placed and has prolonged ileus. Pleural effusion---CXR 8/20 shows atelectasis--  Clinical Impression  Patient evaluated by Physical Therapy with no further acute PT needs identified. All education has been completed and the patient has no further questions.  Pt doing well with encouragement, initially refused but then agreed to mobility/amb. Encouraged pt to continue to mobilize.    See below for any follow-up Physical Therapy or equipment needs. PT is signing off. Thank you for this referral.     Follow Up Recommendations No PT follow up    Equipment Recommendations  None recommended by PT    Recommendations for Other Services       Precautions / Restrictions Precautions Precautions: Fall Restrictions Weight Bearing Restrictions: No      Mobility  Bed Mobility Overal bed mobility: Needs Assistance Bed Mobility: Supine to Sit;Sit to Supine   Sidelying to sit: Supervision Supine to sit: HOB elevated;Modified independent (Device/Increase time) Sit to supine: Modified independent (Device/Increase time);HOB elevated   General bed mobility comments: incr time, no assist, pt has hospital/adjustable bed at home    Transfers Overall transfer level: Needs assistance Equipment used: 4-wheeled walker Transfers: Sit to/from [employer] Transfers Sit to Stand: Min guard Stand pivot transfers: Min guard       General transfer comment: for safety and line management  Ambulation/Gait Ambulation/Gait  assistance: Min guard;Supervision Gait Distance (Feet): 80 Feet (20' more) Assistive device: 4-wheeled walker Gait Pattern/deviations: Step-through pattern;Trunk flexed;Decreased stride length     General Gait Details: cues for posture, sitting rest after 80' d/t abd pain/"cramps". pt able to continue 20' further back to room after rest d/t pain  Stairs            Wheelchair Mobility    Modified Rankin (Stroke Patients Only)       Balance Overall balance assessment: Needs assistance Sitting-balance support: No upper extremity supported;Feet supported Sitting balance-Leahy Scale: Good       Standing balance-Leahy Scale: Fair Standing balance comment: able to stand, perform peri-care without UE support, UE support for dynamic tasks                             Pertinent Vitals/Pain Pain Assessment: Faces Faces Pain Scale: Hurts little more Pain Location: "all over" Pain Descriptors / Indicators: Discomfort;Sore Pain Intervention(s): Monitored during session;Limited activity within patient's tolerance;Heat applied;Repositioned (kpad)    Home Living Family/patient expects to be discharged to:: Private residence Living Arrangements: Alone Available Help at Discharge: Family Type of Home: House Home Access: [place]: One [place]: Environmental consultant - 4 wheels;Grab bars - toilet      Prior Function Level of Independence: Independent               Hand Dominance        Extremity/Trunk Assessment   Upper Extremity Assessment Upper Extremity Assessment: Overall WFL for tasks assessed    Lower Extremity Assessment Lower Extremity Assessment: Overall WFL for tasks assessed       Communication  Communication: No difficulties  Cognition Arousal/Alertness: Awake/alert Behavior During Therapy: WFL for tasks assessed/performed Overall Cognitive Status: Within Functional Limits for tasks assessed                                         General Comments      Exercises     Assessment/Plan    PT Assessment Patent does not need any further PT services  PT Problem List         PT Treatment Interventions      PT Goals (Current goals can be found in the Care Plan section)  Acute Rehab PT Goals Patient Stated Goal: home PT Goal Formulation: All assessment and education complete, DC therapy    Frequency     Barriers to discharge        Co-evaluation               AM-PAC PT "6 Clicks" Mobility  Outcome Measure Help needed turning from your back to your side while in a flat bed without using bedrails?: None Help needed moving from lying on your back to sitting on the side of a flat bed without using bedrails?: None Help needed moving to and from a bed to a chair (including a wheelchair)?: None Help needed standing up from a chair using your arms (e.g., wheelchair or bedside chair)?: None Help needed to walk in hospital room?: A Little Help needed climbing 3-5 steps with a railing? : A Little 6 Click Score: 22    End of Session Equipment Utilized During Treatment: Gait belt Activity Tolerance: Patient tolerated treatment well Patient left: in bed;with call bell/phone within reach;with bed alarm set   PT Visit Diagnosis: Other abnormalities of gait and mobility (R26.89)    Time: [id number] PT Time Calculation (min) (ACUTE ONLY): 27 min   Charges:   PT Evaluation $PT Eval Low Complexity: 1 Low PT Treatments $Gait Training: 8-22 mins        Baxter Flattery, PT  Acute Rehab Dept (Sun) [phone] Pager [phone]  01/20/2021   [facility] 01/20/2021, 1:22 PM

## 2021-01-20 NOTE — Progress Notes (Signed)
6 Days Post-Op   Subjective/Chief Complaint: Complains of swelling in her legs   Objective: Vital signs in last 24 hours: Temp:  [97.8 F (36.6 C)-98.8 F (37.1 C)] 97.8 F (36.6 C) (08/22 0600) Pulse Rate:  [97-116] 107 (08/22 0616) Resp:  [16-18] 18 (08/22 0600) BP: (167-179)/(76-101) 167/101 (08/22 0600) SpO2:  [92 %-96 %] 96 % (08/22 0616) Last BM Date: 01/10/21  Intake/Output from previous day: 08/21 0701 - 08/22 0700 In: 1490.9 [P.O.:990; I.V.:350.9; IV Piggyback:150.1] Out: 1150 [Urine:1150] Intake/Output this shift: No intake/output data recorded.  General appearance: alert and cooperative Resp: Unlabored Cardio: regular rate and rhythm GI: soft, minimally distended, moderate tenderness.  Incision clean dry intact without cellulitis or hematoma Bilateral lower extremity pitting edema  Lab Results:  Recent Labs    01/19/21 0351 01/20/21 0404  WBC 14.8* 17.6*  HGB 10.9* 10.7*  HCT 32.2* 30.6*  PLT 363 387    BMET Recent Labs    01/19/21 0351 01/20/21 0404  NA 135 138  K 3.6 3.0*  CL 97* 98  CO2 27 31  GLUCOSE 147* 119*  BUN <5* <5*  CREATININE 0.38* 0.38*  CALCIUM 8.2* 8.7*    PT/INR No results for input(s): LABPROT, INR in the last 72 hours. ABG No results for input(s): PHART, HCO3 in the last 72 hours.  Invalid input(s): PCO2, PO2  Studies/Results: CT ABDOMEN PELVIS W CONTRAST  Addendum Date: 01/19/2021   ADDENDUM REPORT: 01/19/2021 14:13 ADDENDUM: Although the most inferior aspect of the labia was excluded from the field of view, which would include the skin and a small amount of subcutaneous soft tissue, the vast majority of the perineum is visualized. There is no evidence of an abscess or significant inflammation. Electronically Signed   By: Lajean Manes M.D.   On: 01/19/2021 14:13   Result Date: 01/19/2021 CLINICAL DATA:  Abdominal pain and swelling. Status post laparoscopy on 01/14/2021 with lysis of adhesions and small bowel  resection. EXAM: CT ABDOMEN AND PELVIS WITH CONTRAST TECHNIQUE: Multidetector CT imaging of the abdomen and pelvis was performed using the standard protocol following bolus administration of intravenous contrast. CONTRAST:  89m OMNIPAQUE IOHEXOL 350 MG/ML SOLN COMPARISON:  01/11/2021 FINDINGS: Lower chest: Small, right greater than left, pleural effusions. Associated dependent lower lobe opacity consistent with atelectasis. Hepatobiliary: Normal liver. Status post cholecystectomy. Mild intra and extrahepatic bile duct dilation. Common bile duct with maximum diameter of 9 mm, demonstrating distal tapering. Findings are similar to the prior CT. Pancreas: No mass or inflammation.  No duct dilation. Spleen: Normal in size without focal abnormality. Adrenals/Urinary Tract: No adrenal masses. Kidneys normal size, orientation and position with symmetric enhancement and excretion. Small low-attenuation area along the posterior upper pole the right kidney. This may reflect an area of prior scarring or a small cyst or a combination. No other renal masses or lesions. No stones. No hydronephrosis. Normal ureters. Bladder is mildly distended. No wall thickening or mass. No stone. Non dependent air noted in the anterior bladder. Stomach/Bowel: Stomach is decompressed, otherwise unremarkable. Small bowel is normal in caliber. No wall thickening. There is a decompressed loop of small bowel in the left mid abdomen where there is a small bowel anastomosis staple line. This is surrounded by hazy increased attenuation in the mesenteric fat. There is no extraluminal air. No defined fluid collection to suggest an abscess. Left colon is decompressed. Right colon normal in caliber. No colonic wall thickening or evidence of inflammation. Scattered sigmoid colon diverticula. Vascular/Lymphatic: Mild aortic  atherosclerosis. No aneurysm. Vascular structures otherwise unremarkable. No enlarged lymph nodes. Reproductive: Status post  hysterectomy. No adnexal masses. Other: Small areas of subcutaneous air noted along the right mid abdomen, in the lateral to the left ilium. Small amount of air adjacent to the rectus abdominus muscle on the right, in the lower abdomen, and rectus abdominus muscle in the upper abdomen on the left. These areas of air have presumably tract from the laparoscopic insertion sites. No abdominal wall hernia. No ascites. Diffuse subcutaneous edema extends from the mid abdomen to pelvis proximal lower extremities. Musculoskeletal: No fracture or acute finding.  No bone lesion. IMPRESSION: 1. No evidence of bowel obstruction. 2. Hazy opacity within the fat adjacent to a small bowel anastomosis in the left mid abdomen. This is consistent with postoperative edema. No defined fluid collection to suggest an abscess. 3. No ascites or free intraperitoneal air. 4. Bilateral pleural effusions, dependent lower lobe atelectasis and diffuse subcutaneous edema in the mid to lower abdomen and pelvis. Electronically Signed: By: Lajean Manes M.D. On: 01/19/2021 12:33    Anti-infectives: Anti-infectives (From admission, onward)    Start     Dose/Rate Route Frequency Ordered Stop   01/15/21 0600  cefoTEtan (CEFOTAN) 2 g in sodium chloride 0.9 % 100 mL IVPB  Status:  Discontinued       Note to Pharmacy: Pharmacy may adjust dose strength for optimal dosing.   Send with patient on call to the OR.  Anesthesia to complete antibiotic administration <48mn [facility]   2 g 200 mL/hr over 30 Minutes Intravenous On call to O.R. 01/14/21 1216 01/14/21 1219   01/14/21 2200  cefoTEtan (CEFOTAN) 2 g in sodium chloride 0.9 % 100 mL IVPB        2 g 200 mL/hr over 30 Minutes Intravenous Every 12 hours 01/14/21 1632 01/14/21 2149   01/14/21 1215  cefoTEtan (CEFOTAN) 2 g in sodium chloride 0.9 % 100 mL IVPB        2 g 200 mL/hr over 30 Minutes Intravenous On call to O.R. 01/14/21 1115 01/14/21 1353        Assessment/Plan: s/p Procedure(s): LAPAROSCOPY DIAGNOSTIC, LYSIS OF ADHESIONS, laparoscopic assisted open small bowel resection. (N/A) Continue clears for now [facility] s/p LAPAROSCOPIC LYSIS OF ADHESIONS, SMALL BOWEL RESECTION, COLON SEROSAL REPAIR Dr GJohney Maine8/16 - passing flatus and NGT out, try full liquids today - pain control improved on regimen of scheduled robaxin, tylenol, shortened hydromorphone interval, ice/heat - encouraged OOB and ambulation - monitor leukocytosis-White count up a bit today but she received steroids for CT scan yesterday evening which was negative and clinically does not appear to have any evidence of infection or leak. -May benefit from some diuresis, will defer to primary   FEN: Advance to full liquid VTE: lovenox ID: [email or web address] periop   -Hypothyroidism, chronic pain, anxiety, GERD, etc. Per primary service  LOS: 9 days    CClovis Riley8/22/2022

## 2021-01-21 DIAGNOSIS — K562 Volvulus: Secondary | ICD-10-CM | POA: Diagnosis not present

## 2021-01-21 LAB — BASIC METABOLIC PANEL
Anion gap: 9 (ref 5–15)
BUN: <5 mg/dL — ABNORMAL LOW (ref 8–23)
CO2: 34 mmol/L — ABNORMAL HIGH (ref 22–32)
Calcium: 8.4 mg/dL — ABNORMAL LOW (ref 8.9–10.3)
Chloride: 94 mmol/L — ABNORMAL LOW (ref 98–111)
Creatinine, Ser: 0.54 mg/dL (ref 0.44–1.00)
GFR, Estimated: >60 mL/min (ref >60)
Glucose, Bld: 114 mg/dL — ABNORMAL HIGH (ref 70–99)
Potassium: 3.1 mmol/L — ABNORMAL LOW (ref 3.5–5.1)
Sodium: 137 mmol/L (ref 135–145)

## 2021-01-21 LAB — CBC
HCT: 31.5 % — ABNORMAL LOW (ref 36.0–46.0)
Hemoglobin: 10.8 g/dL — ABNORMAL LOW (ref 12.0–15.0)
MCH: 33.9 pg (ref 26.0–34.0)
MCHC: 34.3 g/dL (ref 30.0–36.0)
MCV: 98.7 fL (ref 80.0–100.0)
Platelets: 362 10*3/uL (ref 150–400)
RBC: 3.19 MIL/uL — ABNORMAL LOW (ref 3.87–5.11)
RDW: 13.3 % (ref 11.5–15.5)
WBC: 20.8 10*3/uL — ABNORMAL HIGH (ref 4.0–10.5)
nRBC: 0 % (ref 0.0–0.2)

## 2021-01-21 LAB — MAGNESIUM: Magnesium: 2 mg/dL (ref 1.7–2.4)

## 2021-01-21 LAB — GLUCOSE, CAPILLARY
Glucose-Capillary: 107 mg/dL — ABNORMAL HIGH (ref 70–99)
Glucose-Capillary: 116 mg/dL — ABNORMAL HIGH (ref 70–99)
Glucose-Capillary: 122 mg/dL — ABNORMAL HIGH (ref 70–99)
Glucose-Capillary: 148 mg/dL — ABNORMAL HIGH (ref 70–99)

## 2021-01-21 MED ORDER — DIPHENHYDRAMINE HCL 25 MG PO CAPS: 25.0000 mg | ORAL_CAPSULE | Freq: Four times a day (QID) | ORAL | Status: DC | PRN

## 2021-01-21 MED ORDER — ACETAMINOPHEN 500 MG PO TABS
1000.0000 mg | ORAL_TABLET | Freq: Four times a day (QID) | ORAL | Status: DC
  Administered 2021-01-21 – 2021-01-22 (×5): 1000 mg via ORAL
  Filled 2021-01-21 (×5): qty 2

## 2021-01-21 MED ORDER — LEVOTHYROXINE SODIUM 75 MCG PO TABS: 75.0000 ug | ORAL_TABLET | Freq: Every day | ORAL | Status: DC

## 2021-01-21 MED ORDER — TRIAMTERENE-HCTZ 37.5-25 MG PO TABS
1.0000 | ORAL_TABLET | Freq: Every day | ORAL | Status: DC
  Administered 2021-01-21 – 2021-01-22 (×2): 1 via ORAL
  Filled 2021-01-21 (×2): qty 1

## 2021-01-21 MED ORDER — BUPROPION HCL ER (XL) 300 MG PO TB24
300.0000 mg | ORAL_TABLET | Freq: Every day | ORAL | Status: DC
  Administered 2021-01-21 – 2021-01-22 (×2): 300 mg via ORAL
  Filled 2021-01-21 (×2): qty 1

## 2021-01-21 MED ORDER — ONDANSETRON 4 MG PO TBDP
4.0000 mg | ORAL_TABLET | Freq: Four times a day (QID) | ORAL | Status: DC
  Administered 2021-01-21 – 2021-01-22 (×3): 4 mg via ORAL
  Filled 2021-01-21 (×3): qty 1

## 2021-01-21 MED ORDER — METHOCARBAMOL 500 MG PO TABS
500.0000 mg | ORAL_TABLET | Freq: Three times a day (TID) | ORAL | Status: DC
  Administered 2021-01-21 – 2021-01-22 (×4): 500 mg via ORAL
  Filled 2021-01-21 (×4): qty 1

## 2021-01-21 MED ORDER — POTASSIUM CHLORIDE CRYS ER 20 MEQ PO TBCR
80.0000 meq | EXTENDED_RELEASE_TABLET | Freq: Two times a day (BID) | ORAL | Status: DC
  Administered 2021-01-21 – 2021-01-22 (×2): 80 meq via ORAL
  Filled 2021-01-21 (×2): qty 4

## 2021-01-21 MED ORDER — POTASSIUM CHLORIDE 10 MEQ/100ML IV SOLN
10.0000 meq | INTRAVENOUS | Status: AC
  Filled 2021-01-21 (×2): qty 100

## 2021-01-21 MED ORDER — TRAMADOL HCL 50 MG PO TABS
50.0000 mg | ORAL_TABLET | Freq: Four times a day (QID) | ORAL | Status: DC | PRN
  Administered 2021-01-21: 50 mg via ORAL
  Filled 2021-01-21: qty 1

## 2021-01-21 MED ORDER — FUROSEMIDE 10 MG/ML IJ SOLN
60.0000 mg | Freq: Every day | INTRAMUSCULAR | Status: DC
  Administered 2021-01-21: 60 mg via INTRAVENOUS
  Filled 2021-01-21: qty 6

## 2021-01-21 MED ORDER — HYDROMORPHONE HCL 1 MG/ML IJ SOLN
0.5000 mg | INTRAMUSCULAR | Status: DC | PRN
  Administered 2021-01-21 – 2021-01-22 (×2): 0.5 mg via INTRAVENOUS
  Filled 2021-01-21 (×2): qty 0.5

## 2021-01-21 MED ORDER — ONDANSETRON 4 MG PO TBDP
4.0000 mg | ORAL_TABLET | Freq: Three times a day (TID) | ORAL | Status: DC | PRN
  Administered 2021-01-21: 4 mg via ORAL
  Filled 2021-01-21: qty 1

## 2021-01-21 NOTE — Evaluation (Signed)
Physical Therapy Evaluation Patient Details Name: Laura Barnes MRN: [id number] DOB: 1941/04/25 Today's Date: 01/21/2021   History of Present Illness  80 yo female admitted with closed-loop strangulated small bowel obstruction  Laparoscopic lysis of adhesions, small bowel resection, colon serosal repair, 01/14/2021 . Hx of fibromyalgia, NSAID induced GI bleed, diastolic dysfunction, IBS.  Clinical Impression  PT Re-eval ordered. Pt was Min guard for mobility during session. She declined to walk in hallway-c/o dizziness and weakness. She is adamant about d/c home-not open to rehab. Recommend ambulation with nursing supervision/assistance.     Follow Up Recommendations Home health PT;Supervision/Assistance - 24 hour (pt refusing placement)    Equipment Recommendations  None recommended by PT    Recommendations for Other Services       Precautions / Restrictions Precautions Precautions: Fall Restrictions Weight Bearing Restrictions: No      Mobility  Bed Mobility Overal bed mobility: Needs Assistance Bed Mobility: Supine to Sit;Sit to Supine     Supine to sit: Supervision Sit to supine: Supervision   General bed mobility comments: incr time, no assist, pt has hospital/adjustable bed at home    Transfers Overall transfer level: Needs assistance Equipment used: 4-wheeled walker Transfers: Sit to/from Stand Sit to Stand: Min guard         General transfer comment: Min guard for safety, line management  Ambulation/Gait             General Gait Details: Pt declined ambulation-c/o feeling weak and dizzy.  Stairs            Wheelchair Mobility    Modified Rankin (Stroke Patients Only)       Balance Overall balance assessment: Needs assistance   Sitting balance-Leahy Scale: Good     Standing balance support: Bilateral upper extremity supported Standing balance-Leahy Scale: Fair                               Pertinent  Vitals/Pain Pain Assessment: Faces Faces Pain Scale: Hurts little more Pain Location: generalized pain Pain Descriptors / Indicators: Discomfort;Sore Pain Intervention(s): Monitored during session;Limited activity within patient's tolerance;Heat applied    Home Living Family/patient expects to be discharged to:: Private residence Living Arrangements: Alone Available Help at Discharge: Family Type of Home: Group Home Home Access: Ramped entrance     Home Layout: One level [place]: Walker - 4 wheels;Grab bars - toilet      Prior Function Level of Independence: Independent               Hand Dominance        Extremity/Trunk Assessment   Upper Extremity Assessment Upper Extremity Assessment: Overall WFL for tasks assessed    Lower Extremity Assessment Lower Extremity Assessment: Generalized weakness    Cervical / Trunk Assessment Cervical / Trunk Assessment: Normal  Communication   Communication: No difficulties  Cognition Arousal/Alertness: Awake/alert Behavior During Therapy: WFL for tasks assessed/performed Overall Cognitive Status: Within Functional Limits for tasks assessed                                        General Comments      Exercises     Assessment/Plan    PT Assessment Patient needs continued PT services  PT Problem List Decreased strength;Decreased balance;Decreased activity tolerance;Decreased mobility       PT Treatment Interventions  DME instruction;Gait training;Functional mobility training;Therapeutic activities;Balance training;Patient/family education;Therapeutic exercise    PT Goals (Current goals can be found in the Care Plan section)  Acute Rehab PT Goals Patient Stated Goal: home PT Goal Formulation: With patient Time For Goal Achievement: 02/04/21 Potential to Achieve Goals: Good    Frequency Min 2X/week   Barriers to discharge Decreased caregiver support      Co-evaluation                AM-PAC PT "6 Clicks" Mobility  Outcome Measure Help needed turning from your back to your side while in a flat bed without using bedrails?: None Help needed moving from lying on your back to sitting on the side of a flat bed without using bedrails?: None Help needed moving to and from a bed to a chair (including a wheelchair)?: A Little Help needed standing up from a chair using your arms (e.g., wheelchair or bedside chair)?: A Little Help needed to walk in hospital room?: A Little Help needed climbing 3-5 steps with a railing? : A Little 6 Click Score: 20    End of Session   Activity Tolerance: Patient tolerated treatment well Patient left: in bed;with call bell/phone within reach   PT Visit Diagnosis: Other abnormalities of gait and mobility (R26.89);Muscle weakness (generalized) (M62.81)    Time: 1610-1630 PT Time Calculation (min) (ACUTE ONLY): 20 min   Charges:   PT Evaluation $PT Re-evaluation: 1 Re-eval            Doreatha Massed, PT Acute Rehabilitation  Office: [phone] Pager: [phone]

## 2021-01-21 NOTE — Progress Notes (Addendum)
Patient refused Potassium Packets. Patient prefers pill form. Doctor made aware per Patient.  Noted Pharmacy in Mar Notes.

## 2021-01-21 NOTE — Plan of Care (Signed)

## 2021-01-21 NOTE — Progress Notes (Signed)
PROGRESS NOTE   Laura Barnes  [id number] DOB: 07-28-1940 DOA: 01/11/2021 PCP: Binnie Rail, MD  Brief Narrative:  80 year old white female Known history of anxiety hypothyroid HTN Allergies NSAID induced GI bleed fibromyalgia grade 1 diastolic dysfunction IBS admit 01/11/2021 generalized abdominal pain-found to have small bowel obstruction on arrival Manage conservatively until 8/16 then underwent laparoscopic lysis of adhesions and small bowel resection with colon serosal repair  NG tube placed and has prolonged ileus  Developed labial swelling--clamping trials in process  Hospital-Problem based course    Small bowel obstruction status post laparoscopic repair Postop ileus is resolved  diet graduation per general surgery currently on full liquids Left labial swelling Had overt swelling probably secondary to anasarca but there was some redness I consulted Dr. Purcell Nails Bass--gynecology over the weekend-we got a CT with contrast but need to be premedicated with prednisone for this CT showed no abscess and patient symptomatically improving Leukocytosis felt to be secondary to steroids for CT premedication Do not think this has a bearing on current SBO issues and I feel can graduate diet if she is doing well  HTN, sinus tach resumed oral meds on 8/23 Maxide Acute decompensated HFpEF [id number] grade 1 diastolic dysfunction [id number] Still + 4 liters IV Lasix 60 daily--cannot aggressively diurese as continues to be hypokalemic Expect to do this over the next several days Hypokalemia continue oral potassium 80 twice a day  Check am mag Hypothyroid Replacing Synthroid 25 mcg IV Fibromyalgia/depression Would hold all psychotropics and only cautiously re-start as OP with PCP--has been off for several days as NPO and is compensating well   DVT prophylaxis: Lovenox Code Status: Full Family Communication: patient updated alone Disposition:  Status is: Inpatient  Remains  inpatient appropriate because:Hemodynamically unstable  Dispo: The patient is from: Home              Anticipated d/c is to: Home  she decline SNF wants to go home with HH--Pt to re-eval              Patient currently is not medically stable to d/c.   Difficult to place patient No  Consultants:  General surgery  Procedures: Laparoscopy 8/16  Antimicrobials: Perioperative   Subjective:  Overall much improved Taking in some orals and liquids Asking if can eat No cp   Objective: Vitals:   01/20/21 0616 01/20/21 1318 01/20/21 2206 01/21/21 0536  BP:  (!) 150/74 (!) 145/74 137/64  Pulse: (!) 107 (!) 102 98 85  Resp:  '17 16 18  '$ Temp:  98.7 F (37.1 C) 98.2 F (36.8 C) 98.5 F (36.9 C)  TempSrc:  Oral    SpO2: 96% 97% 95% 98%  Weight:      Height:        Intake/Output Summary (Last 24 hours) at 01/21/2021 1004 Last data filed at 01/21/2021 0725 Gross per 24 hour  Intake 730 ml  Output 200 ml  Net 530 ml    Filed Weights   01/11/21 1132  Weight: 57.2 kg    Examination:  rom intact Anasarca slight improved Labial swelling much improved No significant abdominal pain on exam she seems less distended Chest clear no rales rhonchi S1-S2 sinus  Data Reviewed: personally reviewed   CBC    Component Value Date/Time   WBC 20.8 (H) 01/21/2021 0417   RBC 3.19 (L) 01/21/2021 0417   HGB 10.8 (L) 01/21/2021 0417   HCT 31.5 (L) 01/21/2021 0417   PLT 362 01/21/2021 0417  MCV 98.7 01/21/2021 0417   MCH 33.9 01/21/2021 0417   MCHC 34.3 01/21/2021 0417   RDW 13.3 01/21/2021 0417   LYMPHSABS 2.2 01/20/2021 0404   MONOABS 1.9 (H) 01/20/2021 0404   EOSABS 0.3 01/20/2021 0404   BASOSABS 0.0 01/20/2021 0404   CMP Latest Ref Rng & Units 01/21/2021 01/20/2021 01/19/2021  Glucose 70 - 99 mg/dL 114(H) 119(H) 147(H)  BUN 8 - 23 mg/dL <5(L) <5(L) <5(L)  Creatinine 0.44 - 1.00 mg/dL 0.54 0.38(L) 0.38(L)  Sodium 135 - 145 mmol/L 137 138 135  Potassium 3.5 - 5.1 mmol/L 3.1(L)  3.0(L) 3.6  Chloride 98 - 111 mmol/L 94(L) 98 97(L)  CO2 22 - 32 mmol/L 34(H) 31 27  Calcium 8.9 - 10.3 mg/dL 8.4(L) 8.7(L) 8.2(L)  Total Protein 6.5 - 8.1 g/dL - 5.7(L) -  Total Bilirubin 0.3 - 1.2 mg/dL - 0.8 -  Alkaline Phos 38 - 126 U/L - 49 -  AST 15 - 41 U/L - 24 -  ALT 0 - 44 U/L - 18 -     Radiology Studies: CT ABDOMEN PELVIS W CONTRAST  Addendum Date: 01/19/2021   ADDENDUM REPORT: 01/19/2021 14:13 ADDENDUM: Although the most inferior aspect of the labia was excluded from the field of view, which would include the skin and a small amount of subcutaneous soft tissue, the vast majority of the perineum is visualized. There is no evidence of an abscess or significant inflammation. Electronically Signed   By: Lajean Manes M.D.   On: 01/19/2021 14:13   Result Date: 01/19/2021 CLINICAL DATA:  Abdominal pain and swelling. Status post laparoscopy on 01/14/2021 with lysis of adhesions and small bowel resection. EXAM: CT ABDOMEN AND PELVIS WITH CONTRAST TECHNIQUE: Multidetector CT imaging of the abdomen and pelvis was performed using the standard protocol following bolus administration of intravenous contrast. CONTRAST:  37m OMNIPAQUE IOHEXOL 350 MG/ML SOLN COMPARISON:  01/11/2021 FINDINGS: Lower chest: Small, right greater than left, pleural effusions. Associated dependent lower lobe opacity consistent with atelectasis. Hepatobiliary: Normal liver. Status post cholecystectomy. Mild intra and extrahepatic bile duct dilation. Common bile duct with maximum diameter of 9 mm, demonstrating distal tapering. Findings are similar to the prior CT. Pancreas: No mass or inflammation.  No duct dilation. Spleen: Normal in size without focal abnormality. Adrenals/Urinary Tract: No adrenal masses. Kidneys normal size, orientation and position with symmetric enhancement and excretion. Small low-attenuation area along the posterior upper pole the right kidney. This may reflect an area of prior scarring or a small  cyst or a combination. No other renal masses or lesions. No stones. No hydronephrosis. Normal ureters. Bladder is mildly distended. No wall thickening or mass. No stone. Non dependent air noted in the anterior bladder. Stomach/Bowel: Stomach is decompressed, otherwise unremarkable. Small bowel is normal in caliber. No wall thickening. There is a decompressed loop of small bowel in the left mid abdomen where there is a small bowel anastomosis staple line. This is surrounded by hazy increased attenuation in the mesenteric fat. There is no extraluminal air. No defined fluid collection to suggest an abscess. Left colon is decompressed. Right colon normal in caliber. No colonic wall thickening or evidence of inflammation. Scattered sigmoid colon diverticula. Vascular/Lymphatic: Mild aortic atherosclerosis. No aneurysm. Vascular structures otherwise unremarkable. No enlarged lymph nodes. Reproductive: Status post hysterectomy. No adnexal masses. Other: Small areas of subcutaneous air noted along the right mid abdomen, in the lateral to the left ilium. Small amount of air adjacent to the rectus abdominus muscle on the right,  in the lower abdomen, and rectus abdominus muscle in the upper abdomen on the left. These areas of air have presumably tract from the laparoscopic insertion sites. No abdominal wall hernia. No ascites. Diffuse subcutaneous edema extends from the mid abdomen to pelvis proximal lower extremities. Musculoskeletal: No fracture or acute finding.  No bone lesion. IMPRESSION: 1. No evidence of bowel obstruction. 2. Hazy opacity within the fat adjacent to a small bowel anastomosis in the left mid abdomen. This is consistent with postoperative edema. No defined fluid collection to suggest an abscess. 3. No ascites or free intraperitoneal air. 4. Bilateral pleural effusions, dependent lower lobe atelectasis and diffuse subcutaneous edema in the mid to lower abdomen and pelvis. Electronically Signed: By: Lajean Manes M.D. On: 01/19/2021 12:33     Scheduled Meds:  acetaminophen  1,000 mg Oral Q6H   buPROPion  300 mg Oral Daily   Chlorhexidine Gluconate Cloth  6 each Topical Daily   clotrimazole  1 Applicatorful Vaginal QHS   enoxaparin (LOVENOX) injection  40 mg Subcutaneous Q24H   furosemide  60 mg Intravenous Daily   levothyroxine  50 mcg Oral Q0600   [START ON 01/22/2021] levothyroxine  75 mcg Oral QAC breakfast   methocarbamol  500 mg Oral TID   pantoprazole (PROTONIX) IV  40 mg Intravenous Q24H   potassium chloride  80 mEq Oral BID   triamterene-hydrochlorothiazide  1 tablet Oral Daily   Continuous Infusions:  methocarbamol (ROBAXIN) IV Stopped (01/21/21 0616)     LOS: 10 days   Time spent: [place], MD [facility] To contact the attending provider between 7A-7P or the covering provider during after hours 7P-7A, please log into the web site [email or web address] and access using universal [place] password for that web site. If you do not have the password, please call the hospital operator.  01/21/2021, 10:04 AM

## 2021-01-21 NOTE — Progress Notes (Signed)
7 Days Post-Op    CC: Abdominal pain  Subjective: Patient is in bed says she had a couple small bowel movements yesterday.  She is complaining of "gas," pains right now.  She uses a walker at home and lives alone.  She has no immediate family who is available to help her post hospital discharge.  She says she has some kind of home health insurance that provides care at home but she is unsure of what that entails.  She has no one currently to do shopping, cooking, or cleaning for her.  She does report walking in the halls with her walker about halfway up the hall without becoming too tired.  Objective: Vital signs in last 24 hours: Temp:  [98.2 F (36.8 C)-98.7 F (37.1 C)] 98.5 F (36.9 C) (08/23 0536) Pulse Rate:  [85-102] 85 (08/23 0536) Resp:  [16-18] 18 (08/23 0536) BP: (137-150)/(64-74) 137/64 (08/23 0536) SpO2:  [95 %-98 %] 98 % (08/23 0536) Last BM Date: 01/20/21 600 p.o. 200 urine No BM recorded Afebrile, vital signs are stable Potassium 3.1 magnesium 2.0 WBC 20.8 CT abdomen 8/21:  No evidence of bowel obstruction.  Hazy opacity within the fat adjacent to a small bowel anastomosis in the left mid abdomen. This is consistent with postoperative edema. No defined fluid collection to suggest an abscess. No ascites or free intraperitoneal air.  Bilateral pleural effusions, dependent lower lobe atelectasis and diffuse subcutaneous edema in the mid to lower abdomen and pelvis.   Intake/Output from previous day: 08/22 0701 - 08/23 0700 In: 800 [P.O.:600; IV Piggyback:200] Out: 200 [Urine:200] Intake/Output this shift: Total I/O In: 50 [IV Piggyback:50] Out: -   General appearance: alert, cooperative, and no distress Resp: clear to auscultation bilaterally GI: Soft, complaining of gas pains currently.  He is not distended incision is dry with honeycomb dressing in place. Positive bowel sounds, tolerating full liquids, positive BM  Lab Results:  Recent Labs    01/20/21 0404  01/21/21 0417  WBC 17.6* 20.8*  HGB 10.7* 10.8*  HCT 30.6* 31.5*  PLT 387 362    BMET Recent Labs    01/20/21 0404 01/21/21 0417  NA 138 137  K 3.0* 3.1*  CL 98 94*  CO2 31 34*  GLUCOSE 119* 114*  BUN <5* <5*  CREATININE 0.38* 0.54  CALCIUM 8.7* 8.4*   PT/INR No results for input(s): LABPROT, INR in the last 72 hours.  Recent Labs  Lab 01/15/21 0434 01/16/21 0529 01/20/21 0404  AST '16 16 24  '$ ALT '13 13 18  '$ ALKPHOS 35* 45 49  BILITOT 1.2 0.7 0.8  PROT 5.4* 5.1* 5.7*  ALBUMIN 2.8* 2.3* 2.7*     Lipase     Component Value Date/Time   LIPASE 21 01/11/2021 0321     Medications:  Chlorhexidine Gluconate Cloth  6 each Topical Daily   clotrimazole  1 Applicatorful Vaginal QHS   enoxaparin (LOVENOX) injection  40 mg Subcutaneous Q24H   levothyroxine  50 mcg Oral Q0600   magic mouthwash  5 mL Oral QID   pantoprazole (PROTONIX) IV  40 mg Intravenous Q24H   potassium chloride  80 mEq Oral BID    methocarbamol (ROBAXIN) IV Stopped (01/21/21 [id number])     Assessment/Plan  Closed-loop strangulated small bowel obstruction Laparoscopic lysis of adhesions, small bowel resection, colon serosal repair, 01/14/2021 Dr. Michael Boston POD #7  -WBC 14.8(8/21) >> 17.6 >> 20.8(8/23)  -Plan: Advance diet, TOC consult, PT consult, scheduled Tylenol, p.o. Robaxin, see how she  does recheck labs in AM.  FEN: Full liquids ID: Cefotetan preop/Gyne-Lotrimin vag cream 8/20>> day 4; Magic mouthwash 8/16>> day 8 DVT: Lovenox Follow-up: Dr. Johney Maine Pain: Dilaudid 1 mg x 3 yesterday; IV Robaxin; Tylenol 500 mg x 1  Hypothyroid Hypokalemia  -K+ being replaced Chronic pain/fibromyalgia with pain all over Walker dependent at home Anxiety GERD Multiple medical allergies (24) Hx IBS with chronic constipation       LOS: 10 days    Laura Barnes 01/21/2021 Please see Amion

## 2021-01-21 NOTE — TOC Initial Note (Signed)
Transition of Care [facility]) - Initial/Assessment Note   Patient Details  Name: Laura Barnes MRN: [id number] Date of Birth: [birth year]  Transition of Care [facility]) CM/SW Contact:    Sherie Don, LCSW Phone Number: 01/21/2021, 11:27 AM  Clinical Narrative: TOC received consults for patient needing personal care services after discharge as she resides alone. CSW met with patient and patient reported PCS is a new service this year she is receiving from her insurance. CSW provided patient with a list of PCS agencies in the area and explained patient will need to follow up with her insurance to determine which are in-network with her insurance and patient will then need to call the agencies to set up PCS.  Expected Discharge Plan: Home/Self Care Barriers to Discharge: Continued Medical Work up  Patient Goals and CMS Choice Patient states their goals for this hospitalization and ongoing recovery are:: Return home CMS Medicare.gov Compare Post Acute Care list provided to:: Patient Choice offered to / list presented to : NA  Expected Discharge Plan and Services Expected Discharge Plan: Home/Self Care In-house Referral: Clinical Social Work Living arrangements for the past 2 months: Single Family Home           DME Arranged: N/A DME Agency: NA  Prior Living Arrangements/Services Living arrangements for the past 2 months: Single Family Home Patient language and need for interpreter reviewed:: Yes Do you feel safe going back to the place where you live?: Yes      Need for Family Participation in Patient Care: No (Comment) Care giver support system in place?: Yes (comment) Criminal Activity/Legal Involvement Pertinent to Current Situation/Hospitalization: No - Comment as needed  Activities of Daily Living Home Assistive Devices/Equipment: None ADL Screening (condition at time of admission) Patient's cognitive ability adequate to safely complete daily activities?: No Is the patient deaf or  have difficulty hearing?: No Does the patient have difficulty seeing, even when wearing glasses/contacts?: No Does the patient have difficulty concentrating, remembering, or making decisions?: No Patient able to express need for assistance with ADLs?: Yes Does the patient have difficulty dressing or bathing?: Yes Independently performs ADLs?: Yes (appropriate for developmental age) Does the patient have difficulty walking or climbing stairs?: No Weakness of Legs: None Weakness of Arms/Hands: None  Emotional Assessment Appearance:: Appears stated age Attitude/Demeanor/Rapport: Engaged Affect (typically observed): Appropriate Orientation: : Oriented to Self, Oriented to Place, Oriented to  Time, Oriented to Situation Alcohol / Substance Use: Not Applicable  Admission diagnosis:  Nausea [R11.0] Small bowel obstruction ([place]) [K56.609] SBO (small bowel obstruction) ([place]) [K56.609] Encounter for imaging study to confirm nasogastric (NG) tube placement [Z01.89] Encounter for imaging study to confirm nasogastric tube placement [Z01.89] Patient Active Problem List   Diagnosis Date Noted   Small bowel strangulation s/p jejunal resection 8/16/202 01/15/2021   Encounter for imaging study to confirm nasogastric tube placement    SBO (small bowel obstruction) ([place]) 01/11/2021   Rash and other nonspecific skin eruption 12/24/2020   Adverse effect of other drugs, medicaments and biological substances, subsequent encounter 12/24/2020   Other allergic rhinitis 12/24/2020   Skin tear of right upper extremity 10/22/2020   Cellulitis 10/22/2020   Allergic reaction 10/18/2020   Aortic atherosclerosis ([place]) 03/29/2020   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/11/2019   Scoliosis of thoracolumbar spine 07/11/2019   Choking 03/24/2019   Hypokalemia 03/24/2019   Fatigue 12/31/2018   Epistaxis, recurrent 08/08/2018   Degenerative arthritis of knee, bilateral 07/05/2018   Protrusion of  lumbar intervertebral disc 04/20/2018   Difficulty urinating 11/24/2017   Dizziness 08/11/2017   Sacroiliac pain 06/17/2017   Greater trochanteric bursitis of right hip 05/27/2017   Vertigo 04/29/2017   Chronic headaches 12/31/2016   Dysphagia 10/28/2016   Lump of skin 05/11/2016   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   Chronic meniscal tear of knee 03/05/2016   Nausea 03/04/2016   Osteopenia 12/03/2015   Thyroid nodule 11/16/2015   Bilateral leg edema 11/05/2015   Anterior neck pain 11/05/2015   Hormone replacement therapy (HRT) 11/05/2015   Subacromial bursitis 09/16/2015   Spondylosis 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 myelopathy or radiculopathy 03/12/2015   Trochanteric bursitis of both hips 03/12/2015   Subacromial impingement of left shoulder 03/12/2015   Hyperlipidemia 05/01/2014   Chronic pain syndrome 05/01/2014   Lumbar radiculopathy 03/27/2014   Left shoulder pain 11/07/2013   Degeneration of intervertebral disc of cervical region 10/10/2013   Fibromyalgia 09/06/2013   Bilateral shoulder pain 09/06/2013   Chronic cough 04/12/2013   Sinusitis, chronic 04/12/2013   Hypothyroidism 04/07/2012   Chronic venous insufficiency 04/02/2010   Enthesopathy of hip region 07/19/2007   Osteoporosis 07/19/2007   Anxiety 01/23/2007   Depression 01/23/2007   Migraine 01/23/2007   NINAR (noninfectious nonallergic rhinitis) 01/23/2007   Gastroesophageal reflux disease 01/23/2007   IBS 01/23/2007   PCP:  Binnie Rail, MD Pharmacy:   [id number] [id number]: [phone]: 3[place] [facility] [address] [id number]: [phone]: [phone] Readmission Risk Interventions No flowsheet data found.

## 2021-01-22 DIAGNOSIS — K56609 Unspecified intestinal obstruction, unspecified as to partial versus complete obstruction: Secondary | ICD-10-CM | POA: Diagnosis not present

## 2021-01-22 DIAGNOSIS — E876 Hypokalemia: Secondary | ICD-10-CM | POA: Diagnosis not present

## 2021-01-22 LAB — URINALYSIS, ROUTINE W REFLEX MICROSCOPIC
Bilirubin Urine: NEGATIVE
Glucose, UA: NEGATIVE mg/dL
Hgb urine dipstick: NEGATIVE
Ketones, ur: NEGATIVE mg/dL
Nitrite: NEGATIVE
Protein, ur: NEGATIVE mg/dL
Specific Gravity, Urine: 1.012 (ref 1.005–1.030)
pH: 8 (ref 5.0–8.0)

## 2021-01-22 LAB — CBC
HCT: 28.2 % — ABNORMAL LOW (ref 36.0–46.0)
Hemoglobin: 9.8 g/dL — ABNORMAL LOW (ref 12.0–15.0)
MCH: 33.7 pg (ref 26.0–34.0)
MCHC: 34.8 g/dL (ref 30.0–36.0)
MCV: 96.9 fL (ref 80.0–100.0)
Platelets: 367 10*3/uL (ref 150–400)
RBC: 2.91 MIL/uL — ABNORMAL LOW (ref 3.87–5.11)
RDW: 13.2 % (ref 11.5–15.5)
WBC: 16.8 10*3/uL — ABNORMAL HIGH (ref 4.0–10.5)
nRBC: 0 % (ref 0.0–0.2)

## 2021-01-22 LAB — GLUCOSE, CAPILLARY
Glucose-Capillary: 121 mg/dL — ABNORMAL HIGH (ref 70–99)
Glucose-Capillary: 126 mg/dL — ABNORMAL HIGH (ref 70–99)
Glucose-Capillary: 105 mg/dL — ABNORMAL HIGH (ref 70–99)

## 2021-01-22 MED ORDER — HYDROMORPHONE HCL 1 MG/ML IJ SOLN: 0.5000 mg | Freq: Four times a day (QID) | INTRAMUSCULAR | Status: DC | PRN

## 2021-01-22 MED ORDER — TRAMADOL HCL 50 MG PO TABS: 50.0000 mg | ORAL_TABLET | Freq: Two times a day (BID) | ORAL | 0 refills | Status: DC | PRN

## 2021-01-22 MED ORDER — BENZONATATE 200 MG PO CAPS: 200.0000 mg | ORAL_CAPSULE | Freq: Three times a day (TID) | ORAL | Status: DC | PRN

## 2021-01-22 MED ORDER — BOOST / RESOURCE BREEZE PO LIQD CUSTOM
1.0000 | Freq: Three times a day (TID) | ORAL | Status: DC
  Administered 2021-01-22: 1 via ORAL

## 2021-01-22 MED ORDER — ACETAMINOPHEN 500 MG PO TABS: 1000.0000 mg | ORAL_TABLET | Freq: Four times a day (QID) | ORAL | 0 refills | Status: AC | PRN

## 2021-01-22 MED ORDER — METHOCARBAMOL 500 MG PO TABS: 500.0000 mg | ORAL_TABLET | Freq: Three times a day (TID) | ORAL | 0 refills | Status: DC

## 2021-01-22 NOTE — Progress Notes (Signed)
Mobility Specialist - Progress Note    01/22/21 1028  Mobility  Activity Ambulated in hall  Level of Assistance Standby assist, set-up cues, supervision of patient - no hands on  Assistive Device Four wheel walker  Distance Ambulated (ft) 300 ft  Mobility Ambulated with assistance in hallway  Mobility Response Tolerated well  Mobility performed by Mobility specialist  $Mobility charge 1 Mobility    Pt required no assistance to sit EOB or stand. Pt ambulated ~300 ft with four wheel walker and did not c/o of pain, dizziness, or SOB. After session, pt returned to bed and c/o of nausea. Pt was left in bed with call bell at side and RN in room.   [place] Specialist Acute Rehabilitation Services Phone: [phone] 01/22/21, 10:31 AM

## 2021-01-22 NOTE — Discharge Summary (Signed)
Physician Discharge Summary  Laura Barnes TDV:[id number] DOB: 18-May-1941 DOA: 01/11/2021  PCP: Binnie Rail, MD  Admit date: 01/11/2021 Discharge date: 01/22/2021  Admitted From: Home Disposition: Home  Recommendations for Outpatient Follow-up:  Follow up with PCP in 1 week with repeat CBC/BMP Outpatient follow-up with general surgery.  Wound care as per general surgery recommendations. Follow up in ED if symptoms worsen or new appear   Home Health: Home health PT Equipment/Devices: None  Discharge Condition: Stable CODE STATUS: Full Diet recommendation: Heart healthy fluid restriction of up to 1500 cc a day  Brief/Interim Summary: 80 year old female with history of anxiety, hypothyroidism, hypertension, NSAID induced GI bleed, fibromyalgia, grade 1 diastolic dysfunction, IBS presented with generalized abdominal pain and was found to have small bowel obstruction.  She was managed conservatively initially as per general surgery recommendations but subsequently underwent laparoscopic lysis of adhesions and small bowel resection with colon serosal repair on 01/14/2021.  Her condition gradually improved postoperatively.  Diet was gradually advanced by general surgery.  She is tolerating diet.  General surgery has cleared the patient for discharge home today.  She will be discharged home today with outpatient follow-up with general surgery.    Discharge Diagnoses:   Small bowel obstruction Postop ileus: Resolved -Initially treated medically.  Status post laparoscopic lysis of adhesions and small bowel resection with colon serosal repair on 01/14/2021.   -Her condition gradually improved postoperatively.  Diet was gradually advanced by general surgery.  She is tolerating diet.   -General surgery has cleared the patient for discharge home today.  She will be discharged home today with outpatient follow-up with general surgery.    Acute on chronic diastolic heart failure -Treated with  IV Lasix.  Symptomatically improving.  Still has significant lower extremity swelling but improving.  Does not want any more Lasix. -Triamterene/hydrochlorothiazide has been resumed.  Continue this on discharge.  Outpatient follow-up with PCP.  Continue fluid restriction  Hypokalemia -Continue potassium supplementation at home.  Outpatient follow-up  Left labial swelling -Much improved.  Patient was discussed with Dr. Bass/gynecology; CT showed no abscess.  Hypertension -Continue home regimen.  Outpatient follow-up  Hypothyroidism -Continue Synthroid  Fibromyalgia/depression Will resume home medication.  Outpatient follow-up with PCP    Discharge Instructions  Discharge Instructions     Diet - low sodium heart healthy   Complete by: As directed    Discharge wound care:   Complete by: As directed    As per general surgery recommendations   Increase activity slowly   Complete by: As directed       Allergies as of 01/22/2021       Reactions   Prednisone Anaphylaxis   Patient unable to remember why she needed to steroid shot but just knows that when she got back to work she started having a lot of trouble breathing.  (jkl 05/04/14) Multiple epidural steroid injections with depomedrol with no issues. (05/04/18)   Amoxicillin-pot Clavulanate Hives, Diarrhea   Has patient had a PCN reaction causing immediate rash, facial/tongue/throat swelling, SOB or lightheadedness with hypotension: Unknown Has patient had a PCN reaction causing severe rash involving mucus membranes or skin necrosis: Unknown Has patient had a PCN reaction that required hospitalization: Unknown Has patient had a PCN reaction occurring within the last 10 years: Unknown If all of the above answers are "NO", then may proceed with Cephalosporin use.   Aspirin Other (See Comments)   Stomach ache and bleed   Erythromycin Diarrhea   Levofloxacin Other (  See Comments)   Headaches, GI upset   Nsaids Other (See  Comments)   stomach bleeding per pt   Statins Nausea Only, Other (See Comments)   Headache, upset stomach, joint hurt, heartburn   Sumatriptan Hives, Palpitations   Adhesive [tape] Other (See Comments)   blisters   Ivp Dye [iodinated Diagnostic Agents]    If made with blue shellfish then CAN NOT have this type of dye.  Pt was given contrast 10/12/17, not premedicated, had NO COMPLICATIONS- BLO, CT   Lyrica [pregabalin]    Methylphenidate Hcl    Unknown reaction   Mirtazapine    Unknown reaction   Oxycodone    Itching    Red Yeast Rice [cholestin] Other (See Comments)   Pt reports causes aching pains like statins do   Shellfish Allergy    Blue shellfish   Soy Allergy    Unknown reaction   Doxycycline Diarrhea   Hydrocodone Itching   Hydrocodone-acetaminophen Itching   Latex Other (See Comments)   blisters   Rofecoxib Nausea Only   Sertraline Hcl Nausea Only   Sulfonamide Derivatives Itching        Medication List     STOP taking these medications    HYDROmorphone 2 MG tablet Commonly known as: DILAUDID   ondansetron 8 MG disintegrating tablet Commonly known as: ZOFRAN-ODT       TAKE these medications    acetaminophen 500 MG tablet Commonly known as: TYLENOL Take 2 tablets (1,000 mg total) by mouth every 6 (six) hours as needed for mild pain, moderate pain, fever or headache.   AMINO ACID PO Take 1 capsule by mouth 2 (two) times daily.   Arnica [place] Pllt Take by mouth daily.   B Complete Tabs Take 1 tablet by mouth daily. B Complete 162m   benzonatate 200 MG capsule Commonly known as: TESSALON Take 1 capsule (200 mg total) by mouth 3 (three) times daily as needed for cough. TAKE 1 CAPSULE(200 MG) BY MOUTH THREE TIMES DAILY AS NEEDED What changed: See the new instructions.   buPROPion 300 MG 24 hr tablet Commonly known as: WELLBUTRIN XL TAKE 1 TABLET(300 MG) BY MOUTH DAILY What changed: See the new instructions.   Co Q-10 200 MG Caps Take 1  capsule by mouth 2 (two) times daily.   ECHINACEA PO Take 1 tablet by mouth 2 (two) times daily.   ELDERBERRY PO Take by mouth daily.   estradiol 0.5 MG tablet Commonly known as: ESTRACE TAKE 1 TABLET(0.5 MG) BY MOUTH DAILY   fluticasone 50 MCG/ACT nasal spray Commonly known as: FLONASE Place 2 sprays into both nostrils as needed.   Green Tea (Camellia sinensis) Powd Take 1 capsule by mouth daily.   hydrOXYzine 25 MG capsule Commonly known as: VISTARIL Take 1 capsule (25 mg total) by mouth every 8 (eight) hours as needed for itching.   KRILL OIL PO Take by mouth daily.   LECITHIN PO Take 1 tablet by mouth daily.   levothyroxine 75 MCG tablet Commonly known as: SYNTHROID TAKE 1 TABLET BY MOUTH EVERY MORNING What changed:  how much to take how to take this when to take this   MAGNESIUM ASPARTATE PO Take 1 tablet by mouth 2 (two) times daily.   meclizine 25 MG tablet Commonly known as: ANTIVERT Take 1 tablet (25 mg total) by mouth 3 (three) times daily as needed for dizziness.   methocarbamol 500 MG tablet Commonly known as: ROBAXIN Take 1 tablet (500 mg total) by mouth  3 (three) times daily for 7 days.   naloxone 4 MG/0.1ML Liqd nasal spray kit Commonly known as: NARCAN SMARTSIG:Both Nares   ondansetron 4 MG tablet Commonly known as: ZOFRAN TAKE 1 TABLET(4 MG) BY MOUTH EVERY 8 HOURS AS NEEDED FOR NAUSEA OR VOMITING What changed: See the new instructions.   POTASSIUM AMINOBENZOATE PO Take 1 tablet by mouth daily.   Rollator Misc Use rollator for ambulation   traMADol 50 MG tablet Commonly known as: ULTRAM Take 1 tablet (50 mg total) by mouth every 12 (twelve) hours as needed for moderate pain.   triamterene-hydrochlorothiazide 37.5-25 MG tablet Commonly known as: MAXZIDE-25 TAKE 1 TABLET BY MOUTH EVERY DAY   TURMERIC PO Take 1 tablet by mouth 2 (two) times daily.   vitamin C 1000 MG tablet Take 1,000 mg by mouth 2 (two) times daily.   Vitamin  D3 25 MCG (1000 UT) Caps Take 1,000 Units by mouth daily.   vitamin E 180 MG (400 UNITS) capsule Take 400 Units by mouth daily.   ZINC PO Take by mouth daily.               Discharge Care Instructions  (From admission, onward)           Start     Ordered   01/22/21 0000  Discharge wound care:       Comments: As per general surgery recommendations   01/22/21 0953            Follow-up Information     Michael Boston, MD Follow up on 02/04/2021.   Specialties: General Surgery, Colon and Rectal Surgery Why: 1000 Contact information: [address] [place] [address] [phone]         Binnie Rail, MD. Schedule an appointment as soon as possible for a visit in 1 week(s).   Specialty: Internal Medicine Why: With repeat CBC/BMP Contact information: [place] [address] [phone]                Allergies  Allergen Reactions   Prednisone Anaphylaxis    Patient unable to remember why she needed to steroid shot but just knows that when she got back to work she started having a lot of trouble breathing.  (jkl 05/04/14)  Multiple epidural steroid injections with depomedrol with no issues. (05/04/18)   Amoxicillin-Pot Clavulanate Hives and Diarrhea    Has patient had a PCN reaction causing immediate rash, facial/tongue/throat swelling, SOB or lightheadedness with hypotension: Unknown Has patient had a PCN reaction causing severe rash involving mucus membranes or skin necrosis: Unknown Has patient had a PCN reaction that required hospitalization: Unknown Has patient had a PCN reaction occurring within the last 10 years: Unknown If all of the above answers are "NO", then may proceed with Cephalosporin use.    Aspirin Other (See Comments)    Stomach ache and bleed   Erythromycin Diarrhea   Levofloxacin Other (See Comments)    Headaches, GI upset   Nsaids Other (See Comments)    stomach bleeding per pt   Statins  Nausea Only and Other (See Comments)    Headache, upset stomach, joint hurt, heartburn   Sumatriptan Hives and Palpitations   Adhesive [Tape] Other (See Comments)    blisters   Ivp Dye [Iodinated Diagnostic Agents]     If made with blue shellfish then CAN NOT have this type of dye.  Pt was given contrast 10/12/17, not premedicated, had NO COMPLICATIONS- BLO, CT  Lyrica [Pregabalin]    Methylphenidate Hcl     Unknown reaction   Mirtazapine     Unknown reaction   Oxycodone     Itching    Red Yeast Rice [Cholestin] Other (See Comments)    Pt reports causes aching pains like statins do   Shellfish Allergy     Blue shellfish   Soy Allergy     Unknown reaction   Doxycycline Diarrhea   Hydrocodone Itching   Hydrocodone-Acetaminophen Itching   Latex Other (See Comments)    blisters   Rofecoxib Nausea Only   Sertraline Hcl Nausea Only   Sulfonamide Derivatives Itching    Consultations: General surgery   Procedures/Studies: CT ABDOMEN PELVIS WO CONTRAST  Result Date: 01/11/2021 CLINICAL DATA:  Generalized abdominal pain with nausea and vomiting. EXAM: CT ABDOMEN AND PELVIS WITHOUT CONTRAST TECHNIQUE: Multidetector CT imaging of the abdomen and pelvis was performed following the standard protocol without IV contrast. COMPARISON:  November 15, 2015 FINDINGS: Lower chest: Breast implants are noted. Hepatobiliary: No focal liver abnormality is seen. Status post cholecystectomy. Mild central intrahepatic biliary dilatation is seen. Pancreas: Unremarkable. No pancreatic ductal dilatation or surrounding inflammatory changes. Spleen: Normal in size without focal abnormality. Adrenals/Urinary Tract: Adrenal glands are unremarkable. Kidneys are normal, without renal calculi, focal lesion, or hydronephrosis. The urinary bladder is distended and otherwise unremarkable. Stomach/Bowel: There is a small hiatal hernia. The appendix is surgically absent. A large amount of stool is seen throughout the colon.  Multiple small bowel loops within the mid and upper abdomen are predominant fluid-filled and post the upper limit of normal caliber. Dilated small bowel loops are seen within the posterior aspect of the pelvis on the left (maximum small bowel diameter of approximately 3.1 cm). These involve loops of distal jejunum that extend along the region posterior to the urinary bladder on the left. A transition zone is poorly visualized but is located within the medial aspect of the left lower quadrant (axial CT images 48 through 52, CT series 2/sagittal reformatted images 103 through 114, CT series 6). Vascular/Lymphatic: Aortic atherosclerosis. No enlarged abdominal or pelvic lymph nodes. Reproductive: Status post hysterectomy. No adnexal masses. Other: No abdominal wall hernia or abnormality. No abdominopelvic ascites or free air. Musculoskeletal: Marked severity multilevel degenerative changes are seen throughout the lumbar spine. IMPRESSION: 1. Small bowel obstruction originating within the distal jejunum, with a transition zone noted within the left lower quadrant. 2. Small hiatal hernia. 3. Evidence of prior cholecystectomy and prior hysterectomy. Electronically Signed   By: Virgina Norfolk M.D.   On: 01/11/2021 05:32   DG Abd 1 View  Result Date: 01/15/2021 CLINICAL DATA:  Abdominal distension EXAM: ABDOMEN - 1 VIEW COMPARISON:  None. FINDINGS: Nasogastric tube with the tip projecting over the antrum of the stomach. Relative paucity of bowel gas. Moderate amount of stool in the ascending colon. No evidence of pneumoperitoneum, portal venous gas or pneumatosis. No pathologic calcifications along the expected course of the ureters. No acute osseous abnormality. Levoscoliosis of the lumbar spine. Lower lumbar spine spondylosis. IMPRESSION: Nasogastric tube with the tip projecting over the antrum of the stomach. Relative paucity of bowel gas. Electronically Signed   By: Kathreen Devoid M.D.   On: 01/15/2021 08:09   DG  Abd 1 View  Result Date: 01/14/2021 CLINICAL DATA:  Generalized abdominal pain and distention. EXAM: ABDOMEN - 1 VIEW COMPARISON:  January 13, 2021. FINDINGS: The bowel gas pattern is normal. No radio-opaque calculi or other significant radiographic abnormality are  seen. IMPRESSION: Negative. Electronically Signed   By: Marijo Conception M.D.   On: 01/14/2021 12:40   CT ABDOMEN PELVIS W CONTRAST  Addendum Date: 01/19/2021   ADDENDUM REPORT: 01/19/2021 14:13 ADDENDUM: Although the most inferior aspect of the labia was excluded from the field of view, which would include the skin and a small amount of subcutaneous soft tissue, the vast majority of the perineum is visualized. There is no evidence of an abscess or significant inflammation. Electronically Signed   By: Lajean Manes M.D.   On: 01/19/2021 14:13   Result Date: 01/19/2021 CLINICAL DATA:  Abdominal pain and swelling. Status post laparoscopy on 01/14/2021 with lysis of adhesions and small bowel resection. EXAM: CT ABDOMEN AND PELVIS WITH CONTRAST TECHNIQUE: Multidetector CT imaging of the abdomen and pelvis was performed using the standard protocol following bolus administration of intravenous contrast. CONTRAST:  26m OMNIPAQUE IOHEXOL 350 MG/ML SOLN COMPARISON:  01/11/2021 FINDINGS: Lower chest: Small, right greater than left, pleural effusions. Associated dependent lower lobe opacity consistent with atelectasis. Hepatobiliary: Normal liver. Status post cholecystectomy. Mild intra and extrahepatic bile duct dilation. Common bile duct with maximum diameter of 9 mm, demonstrating distal tapering. Findings are similar to the prior CT. Pancreas: No mass or inflammation.  No duct dilation. Spleen: Normal in size without focal abnormality. Adrenals/Urinary Tract: No adrenal masses. Kidneys normal size, orientation and position with symmetric enhancement and excretion. Small low-attenuation area along the posterior upper pole the right kidney. This may reflect  an area of prior scarring or a small cyst or a combination. No other renal masses or lesions. No stones. No hydronephrosis. Normal ureters. Bladder is mildly distended. No wall thickening or mass. No stone. Non dependent air noted in the anterior bladder. Stomach/Bowel: Stomach is decompressed, otherwise unremarkable. Small bowel is normal in caliber. No wall thickening. There is a decompressed loop of small bowel in the left mid abdomen where there is a small bowel anastomosis staple line. This is surrounded by hazy increased attenuation in the mesenteric fat. There is no extraluminal air. No defined fluid collection to suggest an abscess. Left colon is decompressed. Right colon normal in caliber. No colonic wall thickening or evidence of inflammation. Scattered sigmoid colon diverticula. Vascular/Lymphatic: Mild aortic atherosclerosis. No aneurysm. Vascular structures otherwise unremarkable. No enlarged lymph nodes. Reproductive: Status post hysterectomy. No adnexal masses. Other: Small areas of subcutaneous air noted along the right mid abdomen, in the lateral to the left ilium. Small amount of air adjacent to the rectus abdominus muscle on the right, in the lower abdomen, and rectus abdominus muscle in the upper abdomen on the left. These areas of air have presumably tract from the laparoscopic insertion sites. No abdominal wall hernia. No ascites. Diffuse subcutaneous edema extends from the mid abdomen to pelvis proximal lower extremities. Musculoskeletal: No fracture or acute finding.  No bone lesion. IMPRESSION: 1. No evidence of bowel obstruction. 2. Hazy opacity within the fat adjacent to a small bowel anastomosis in the left mid abdomen. This is consistent with postoperative edema. No defined fluid collection to suggest an abscess. 3. No ascites or free intraperitoneal air. 4. Bilateral pleural effusions, dependent lower lobe atelectasis and diffuse subcutaneous edema in the mid to lower abdomen and  pelvis. Electronically Signed: By: DLajean ManesM.D. On: 01/19/2021 12:33   DG CHEST PORT 1 VIEW  Result Date: 01/17/2021 CLINICAL DATA:  Pneumonia. EXAM: PORTABLE CHEST 1 VIEW COMPARISON:  Chest x-ray dated January 11, 2021. FINDINGS: Unchanged enteric tube. The  heart size and mediastinal contours are within normal limits. Normal pulmonary vascularity. New hazy density at both lung bases with blunting of the costophrenic angles. No pneumothorax. No acute osseous abnormality. IMPRESSION: 1. New layering small bilateral pleural effusions with adjacent bibasilar atelectasis. Electronically Signed   By: Titus Dubin M.D.   On: 01/17/2021 09:04   Portable chest 1 View  Result Date: 01/11/2021 CLINICAL DATA:  Per orders: nausea and NG tube placement. EXAM: PORTABLE CHEST - 1 VIEW COMPARISON:  09/19/2014 FINDINGS: Lungs are clear. Nasogastric tube overlies the proximal esophagus with the tip overlying the proximal left mainstem bronchus. Heart size and mediastinal contours are within normal limits. No effusion.  No pneumothorax Visualized bones unremarkable.  Surgical clips right upper abdomen. IMPRESSION: 1. Nasogastric tube tip overlies proximal left mainstem bronchus. Electronically Signed   By: Lucrezia Europe M.D.   On: 01/11/2021 09:04   DG Abd Portable 1V  Result Date: 01/13/2021 CLINICAL DATA:  Nasogastric tube placement, history of small-bowel obstruction EXAM: PORTABLE ABDOMEN - 1 VIEW COMPARISON:  Previous day FINDINGS: The upper abdomen is included in the field of view. The visualized lung fields are poorly evaluated due to technique. Interval advancement of the enteric tube such that the tip overlies the gastric body. Normal bowel gas pattern. Surgical clips in the right upper quadrant. Small residual contrast material within a loop of bowel in the left upper quadrant. The osseous structures are unremarkable. IMPRESSION: Interval advancement of enteric tube with the tip overlying the gastric body.  Electronically Signed   By: Albin Felling M.D.   On: 01/13/2021 08:19   DG Abd Portable 1V  Result Date: 01/12/2021 CLINICAL DATA:  Small bowel obstruction EXAM: PORTABLE ABDOMEN - 1 VIEW COMPARISON:  January 11, 2021 x-ray.  January 11, 2021 CT scan. FINDINGS: The side port of the NG tube is above the GE junction with the distal tip at the GE junction. Lung bases are normal. There is a paucity of bowel gas but no evidence of obstruction. Mild fecal loading in the ascending colon. No other acute abnormalities identified. IMPRESSION: 1. The side port of the NG tube is above the GE junction with the distal tip at the GE junction. Recommend advancing 8 or 10 cm. 2. No evidence of bowel obstruction. 3. Mild fecal loading in the right colon. Electronically Signed   By: Dorise Bullion III M.D.   On: 01/12/2021 10:38   DG Abd Portable 1V-Small Bowel Obstruction Protocol-initial, 8 hr delay  Result Date: 01/12/2021 CLINICAL DATA:  Small bowel follow at 8 hours, initial encounter EXAM: PORTABLE ABDOMEN - 1 VIEW COMPARISON:  Film from earlier in the same day. FINDINGS: Gastric catheter is noted barely within the stomach. Proximal side port lies within the distal esophagus. A small amount of contrast is noted within the stomach. No significant contrast is noted within the large or small bowel. No obstructive changes are seen however. Degenerative changes of lumbar spine are noted. IMPRESSION: No significant contrast material is noted within the large or small bowel. Electronically Signed   By: Inez Catalina M.D.   On: 01/12/2021 00:31   DG Abd Portable 1V  Result Date: 01/11/2021 CLINICAL DATA:  80 year old female status post advanced NG tube. EXAM: PORTABLE ABDOMEN - 1 VIEW COMPARISON:  0818 hours today. FINDINGS: Portable AP supine view at 0952 hours. Enteric tube has been advanced. Tip is now at the level of the gastroesophageal junction. Side hole is at the distal thoracic esophagus. Negative visible lung  bases.  Stable visible abdomen. Lumbar levoconvex scoliosis and degeneration. IMPRESSION: Enteric tube advanced with tip now at the level of the GEJ. Advanced an additional 12-14 cm to ensure side hole placement within the stomach. Electronically Signed   By: Genevie Ann M.D.   On: 01/11/2021 10:32   DG Abd Portable 1V-Small Bowel Protocol-Position Verification  Result Date: 01/11/2021 CLINICAL DATA:  Per orders: nausea and NG tube placement. EXAM: PORTABLE ABDOMEN - 1 VIEW COMPARISON:  CT from earlier the same day FINDINGS: Nasogastric tube tip overlies the mid esophagus just inferior to the left mainstem bronchus. Bowel gas pattern normal. Lumbar levoscoliosis with multilevel spondylitic change. IMPRESSION: Nasogastric tube tip in the midesophagus. Electronically Signed   By: Lucrezia Europe M.D.   On: 01/11/2021 09:05      Subjective: Patient seen and examined at bedside.  Feels okay.  Does not want any more IV Lasix.  Feels that she is ready to go home today.  Denies worsening shortness breath, abdominal pain, nausea or vomiting.  Discharge Exam: Vitals:   01/21/21 2211 01/22/21 0530  BP: (!) 155/79 (!) 148/60  Pulse: 95 91  Resp: 16 16  Temp: 98.4 F (36.9 C) 97.7 F (36.5 C)  SpO2: 99% 98%    General: Pt is alert, awake, not in acute distress.  Elderly female lying in bed.  On room air. Cardiovascular: rate controlled, S1/S2 + Respiratory: bilateral decreased breath sounds at bases Abdominal: Soft, NT, mildly distended, abdominal incision present.  Bowel sounds + Extremities: Bilateral lower extremity edema present.  No cyanosis    The results of significant diagnostics from this hospitalization (including imaging, microbiology, ancillary and laboratory) are listed below for reference.     Microbiology: No results found for this or any previous visit (from the past 240 hour(s)).   Labs: BNP (last 3 results) No results for input(s): BNP in the last 8760 hours. Basic Metabolic  Panel: Recent Labs  Lab 01/16/21 0529 01/17/21 0431 01/18/21 0442 01/19/21 0351 01/20/21 0404 01/21/21 0417  NA 139 137 136 135 138 137  K 2.8* 2.8* 2.8* 3.6 3.0* 3.1*  CL 102 105 100 97* 98 94*  CO2 26 23 27 27 31  34*  GLUCOSE 104* 85 95 147* 119* 114*  BUN 9 5* <5* <5* <5* <5*  CREATININE 0.48 0.41* 0.36* 0.38* 0.38* 0.54  CALCIUM 8.1* 7.7* 7.7* 8.2* 8.7* 8.4*  MG 1.8 1.6* 1.9  --  1.5* 2.0  PHOS 1.7*  --   --   --   --   --    Liver Function Tests: Recent Labs  Lab 01/16/21 0529 01/20/21 0404  AST 16 24  ALT 13 18  ALKPHOS 45 49  BILITOT 0.7 0.8  PROT 5.1* 5.7*  ALBUMIN 2.3* 2.7*   No results for input(s): LIPASE, AMYLASE in the last 168 hours. No results for input(s): AMMONIA in the last 168 hours. CBC: Recent Labs  Lab 01/16/21 0529 01/17/21 0431 01/19/21 0351 01/20/21 0404 01/21/21 0417 01/22/21 0723  WBC 13.9* 14.7* 14.8* 17.6* 20.8* 16.8*  NEUTROABS 9.3*  --   --  11.7*  --   --   HGB 10.3* 9.0* 10.9* 10.7* 10.8* 9.8*  HCT 30.3* 27.2* 32.2* 30.6* 31.5* 28.2*  MCV 100.0 101.9* 98.8 96.8 98.7 96.9  PLT 250 247 [phone]   Cardiac Enzymes: No results for input(s): CKTOTAL, CKMB, CKMBINDEX, TROPONINI in the last 168 hours. BNP: Invalid input(s): POCBNP CBG: Recent Labs  Lab 01/21/21 0654  01/21/21 1117 01/21/21 1910 01/22/21 0128 01/22/21 0534  GLUCAP 122* 107* 116* 121* 126*   D-Dimer No results for input(s): DDIMER in the last 72 hours. Hgb A1c No results for input(s): HGBA1C in the last 72 hours. Lipid Profile No results for input(s): CHOL, HDL, LDLCALC, TRIG, CHOLHDL, LDLDIRECT in the last 72 hours. Thyroid function studies No results for input(s): TSH, T4TOTAL, T3FREE, THYROIDAB in the last 72 hours.  Invalid input(s): FREET3 Anemia work up No results for input(s): VITAMINB12, FOLATE, FERRITIN, TIBC, IRON, RETICCTPCT in the last 72 hours. Urinalysis    Component Value Date/Time   COLORURINE YELLOW 01/11/2021 1628    APPEARANCEUR CLEAR 01/11/2021 1628   LABSPEC 1.015 01/11/2021 1628   PHURINE 6.0 01/11/2021 1628   GLUCOSEU NEGATIVE 01/11/2021 1628   GLUCOSEU NEGATIVE 01/02/2019 1211   HGBUR MODERATE (A) 01/11/2021 1628   BILIRUBINUR NEGATIVE 01/11/2021 1628   BILIRUBINUR neg 02/28/2016 1350   KETONESUR 20 (A) 01/11/2021 1628   PROTEINUR 100 (A) 01/11/2021 1628   UROBILINOGEN 0.2 01/02/2019 1211   NITRITE NEGATIVE 01/11/2021 1628   LEUKOCYTESUR TRACE (A) 01/11/2021 1628   Sepsis Labs Invalid input(s): PROCALCITONIN,  WBC,  LACTICIDVEN Microbiology No results found for this or any previous visit (from the past 240 hour(s)).   Time coordinating discharge: 35 minutes  SIGNED:   Aline August, MD  [facility] 01/22/2021, 9:53 AM

## 2021-01-22 NOTE — Plan of Care (Signed)

## 2021-01-22 NOTE — TOC Transition Note (Signed)
Transition of Care [facility]) - CM/SW Discharge Note  Patient Details  Name: Laura Barnes MRN: [id number] Date of Birth: [birth year]  [facility] CM/SW Contact:  Sherie Don, LCSW Phone Number: 01/22/2021, 4:12 PM  Clinical Narrative: PT recommended HHPT. CSW made referrals to the following agencies:  [facility]: declined [facility]: declined, at capacity with [facility]: declined, [facility] would be next week [facility]: declined without pair [facility]: declined without pair [facility]: declined, [facility] would be one week [facility]: no response  CSW updated patient regarding HHPT referral not being accepted by the local [facility] agencies. TOC signing off.  Final next level of care: Home/Self Care Barriers to Discharge: Barriers Resolved, Barriers Unresolved (comment)  Patient Goals and CMS Choice Patient states their goals for this hospitalization and ongoing recovery are:: Return home CMS Medicare.gov Compare Post Acute Care list provided to:: Patient Choice offered to / list presented to : NA  Discharge Plan and Services In-house Referral: Clinical Social Work        DME Arranged: N/A DME Agency: NA  Readmission Risk Interventions No flowsheet data found.

## 2021-01-22 NOTE — Progress Notes (Addendum)
8 Days Post-Op    CC: Abdominal pain  Subjective: Patient is doing well she seems to be tolerating her diet.  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  She feels like she is ready to go home.  She is concerned she might have a UTI  Objective: Vital signs in last 24 hours: Temp:  [97.7 F (36.5 C)-98.4 F (36.9 C)] 97.7 F (36.5 C) (08/24 0530) Pulse Rate:  [91-97] 91 (08/24 0530) Resp:  [14-16] 16 (08/24 0530) BP: (147-155)/(60-79) 148/60 (08/24 0530) SpO2:  [98 %-99 %] 98 % (08/24 0530) Last BM Date: 01/20/21 120 p.o. recorded 50 IV recorded Voided x4 Stool x6 Afebrile, vital signs are stable WBC improving 16.8 today.    Intake/Output from previous day: 08/23 0701 - 08/24 0700 In: 170 [P.O.:120; IV Piggyback:50] Out: 0  Intake/Output this shift: No intake/output data recorded.  General appearance: alert, cooperative, and no distress Resp: clear to auscultation bilaterally GI: Soft, nontender incision looks good.  Tolerating regular diet.  Lab Results:  Recent Labs    01/21/21 0417 01/22/21 0723  WBC 20.8* 16.8*  HGB 10.8* 9.8*  HCT 31.5* 28.2*  PLT 362 367    BMET Recent Labs    01/20/21 0404 01/21/21 0417  NA 138 137  K 3.0* 3.1*  CL 98 94*  CO2 31 34*  GLUCOSE 119* 114*  BUN <5* <5*  CREATININE 0.38* 0.54  CALCIUM 8.7* 8.4*   PT/INR No results for input(s): LABPROT, INR in the last 72 hours.  Recent Labs  Lab 01/16/21 0529 01/20/21 0404  AST 16 24  ALT 13 18  ALKPHOS 45 49  BILITOT 0.7 0.8  PROT 5.1* 5.7*  ALBUMIN 2.3* 2.7*     Lipase     Component Value Date/Time   LIPASE 21 01/11/2021 0321     Medications:  acetaminophen  1,000 mg Oral Q6H   buPROPion  300 mg Oral Daily   Chlorhexidine Gluconate Cloth  6 each Topical Daily   clotrimazole  1 Applicatorful Vaginal QHS   enoxaparin (LOVENOX) injection  40 mg Subcutaneous Q24H   furosemide  60 mg Intravenous Daily   levothyroxine  75 mcg Oral QAC breakfast    methocarbamol  500 mg Oral TID   ondansetron  4 mg Oral Q6H   pantoprazole (PROTONIX) IV  40 mg Intravenous Q24H   potassium chloride  80 mEq Oral BID   triamterene-hydrochlorothiazide  1 tablet Oral Daily    Assessment/Plan Closed-loop strangulated small bowel obstruction Laparoscopic lysis of adhesions, small bowel resection, colon serosal repair, 01/14/2021 Dr. Michael Boston POD #8  -WBC 14.8(8/21) >> 17.6 >> 20.8(8/23)>>16.8(8/24)  -Plan: Patient reports tolerated diet and ambulating fairly well.  She is concerned about a UTI and I will order a UA and urine culture.  I think once she is medically stable and urine check she can go home.  Follow up appointment, instructions and pain meds are in the AVS.   FEN: Regular diet ID: Cefotetan preop/Gyne-Lotrimin vag cream 8/20>> day 4; Magic mouthwash 8/16>> day 8 DVT: Lovenox Follow-up: Dr. Johney Maine Pain: Dilaudid 1 mg x 1 yesterday, and early this a.m.;  Robaxin p.o. x2 Tylenol 1 g 3 times daily; Zofran x2 yesterday; tramadol x1 yesterday   Hypothyroid Hypokalemia  -K+ being replaced Chronic pain/fibromyalgia with pain all over Walker dependent at home Anxiety GERD Multiple medical allergies (24) Hx IBS with chronic constipation         LOS: 11 days  JENNINGS,WILLARD 01/22/2021 Please see Amion   I personally saw the patient and performed a substantive portion of this encounter, including a complete performance of at least one of the key components (MDM, Hx and/or Exam), in conjunction with the Advanced Practice Provider Earnstine Regal, PA-C.  She is progressing well all things considered.  Continue diet as tolerated, start nutritional supplements, mobilize, wean IV medications.  Can discharge once cleared from a medical standpoint and safe disposition

## 2021-01-22 NOTE — Discharge Instructions (Signed)
[facility]      [facility] [phone]  OPEN ABDOMINAL SURGERY: POST OP INSTRUCTIONS  Always review your discharge instruction sheet given to you by the facility where your surgery was performed.  IF YOU HAVE DISABILITY OR FAMILY LEAVE FORMS, YOU MUST BRING THEM TO THE OFFICE FOR PROCESSING.  PLEASE DO NOT GIVE THEM TO YOUR DOCTOR.  A prescription for pain medication may be given to you upon discharge.  Take your pain medication as prescribed, if needed.  If narcotic pain medicine is not needed, then you may take acetaminophen (Tylenol) or ibuprofen (Advil) as needed. Take your usually prescribed medications unless otherwise directed. If you need a refill on your pain medication, please contact your pharmacy. They will contact our office to request authorization.  Prescriptions will not be filled after 5pm or on week-ends. You should follow a light diet the first few days after arrival home, such as soup and crackers, pudding, etc.unless your doctor has advised otherwise. A high-fiber, low fat diet can be resumed as tolerated.   Be sure to include lots of fluids daily. Most patients will experience some swelling and bruising on the chest and neck area.  Ice packs will help.  Swelling and bruising can take several days to resolve Most patients will experience some swelling and bruising in the area of the incision. Ice pack will help. Swelling and bruising can take several days to resolve..  It is common to experience some constipation if taking pain medication after surgery.  Increasing fluid intake and taking a stool softener will usually help or prevent this problem from occurring.  A mild laxative (Milk of Magnesia or Miralax) should be taken according to package directions if there are no bowel movements after 48 hours.  You may have steri-strips (small skin tapes) in place directly over the incision.  These strips should be left on the skin for 7-10 days.  If your surgeon used skin  glue on the incision, you may shower in 24 hours.  The glue will flake off over the next 2-3 weeks.  Any sutures or staples will be removed at the office during your follow-up visit. You may find that a light gauze bandage over your incision may keep your staples from being rubbed or pulled. You may shower and replace the bandage daily. ACTIVITIES:  You may resume regular (light) daily activities beginning the next day--such as daily self-care, walking, climbing stairs--gradually increasing activities as tolerated.  You may have sexual intercourse when it is comfortable.  Refrain from any heavy lifting or straining until approved by your doctor. You may drive when you no longer are taking prescription pain medication, you can comfortably wear a seatbelt, and you can safely maneuver your car and apply brakes Return to Work: ___________________________________ You should see your doctor in the office for a follow-up appointment approximately two weeks after your surgery.  Make sure that you call for this appointment within a day or two after you arrive home to insure a convenient appointment time. OTHER INSTRUCTIONS:  _____________________________________________________________ _____________________________________________________________  WHEN TO CALL YOUR DOCTOR: Fever over 101.0 Inability to urinate Nausea and/or vomiting Extreme swelling or bruising Continued bleeding from incision. Increased pain, redness, or drainage from the incision. Difficulty swallowing or breathing Muscle cramping or spasms. Numbness or tingling in hands or feet or around lips.  The clinic staff is available to answer your questions during regular business hours.  Please don't hesitate to call and ask to speak to one of   the nurses if you have concerns.  For further questions, please visit [email or web address]  

## 2021-01-23 ENCOUNTER — Telehealth: Payer: Self-pay | Admitting: Internal Medicine

## 2021-01-23 DIAGNOSIS — K562 Volvulus: Secondary | ICD-10-CM | POA: Diagnosis not present

## 2021-01-23 DIAGNOSIS — M419 Scoliosis, unspecified: Secondary | ICD-10-CM | POA: Diagnosis not present

## 2021-01-23 DIAGNOSIS — M48061 Spinal stenosis, lumbar region without neurogenic claudication: Secondary | ICD-10-CM | POA: Diagnosis not present

## 2021-01-23 DIAGNOSIS — I700 Atherosclerosis of aorta: Secondary | ICD-10-CM | POA: Diagnosis not present

## 2021-01-23 DIAGNOSIS — K56609 Unspecified intestinal obstruction, unspecified as to partial versus complete obstruction: Secondary | ICD-10-CM | POA: Diagnosis not present

## 2021-01-23 DIAGNOSIS — Z0189 Encounter for other specified special examinations: Secondary | ICD-10-CM | POA: Diagnosis not present

## 2021-01-23 LAB — URINE CULTURE
Culture: <10000 — AB
Special Requests: NORMAL

## 2021-01-23 NOTE — Telephone Encounter (Signed)
[place] Name: Laura Name: [facility] Phone #: [phone] Service Requested: PT Frequency of Visits: 2 week 4 1 week 3  For strength, gait training, home excerise program, standing/balancing   Also would like to know patients perimeters for BP

## 2021-01-23 NOTE — Clinical Social Work Note (Signed)
CSW notified on 01/23/21 that Laura [facility] would accept the patient for HHPT. Patient is scheduled for her first visit today.

## 2021-01-23 NOTE — Telephone Encounter (Signed)
Ok for verbal  BP parameters call if sbp < 110 or > 155 or dbp < 55 or > 90

## 2021-01-24 ENCOUNTER — Telehealth: Payer: Self-pay

## 2021-01-24 ENCOUNTER — Other Ambulatory Visit: Payer: Self-pay | Admitting: Internal Medicine

## 2021-01-24 ENCOUNTER — Encounter: Payer: Self-pay | Admitting: Internal Medicine

## 2021-01-24 DIAGNOSIS — F419 Anxiety disorder, unspecified: Secondary | ICD-10-CM

## 2021-01-24 NOTE — Telephone Encounter (Signed)
Transition Care Management Follow-up Telephone Call Date of discharge and from where: 01/22/2021 from [facility] How have you been since you were released from the hospital? Doing okay so far Any questions or concerns? No  Items Reviewed: Did the pt receive and understand the discharge instructions provided? Yes  Medications obtained and verified? Yes  Other? No  Any new allergies since your discharge? No  Dietary orders reviewed? No Do you have support at home? Yes , family  Home Care and Equipment/Supplies: Were home health services ordered? yes If so, what is the name of the agency? Name not listed   Has the agency set up a time to come to the patient's home? no Were any new equipment or medical supplies ordered?  No What is the name of the medical supply agency? no Were you able to get the supplies/equipment? not applicable Do you have any questions related to the use of the equipment or supplies? No  Functional Questionnaire: (I = Independent and D = Dependent) ADLs: I  Bathing/Dressing- I  Meal Prep- I  Eating- I  Maintaining continence- I  Transferring/Ambulation- I  Managing Meds- I  Follow up appointments reviewed:  PCP Hospital f/u appt confirmed? Yes  Scheduled to see Billey Gosling, MD on 01/29/2021 @ 2:20 pm. [facility] f/u appt confirmed? No  Patient to see General Surgery Are transportation arrangements needed? No  If their condition worsens, is the pt aware to call PCP or go to the Emergency Dept.? Yes Was the patient provided with contact information for the PCP's office or ED? Yes Was to pt encouraged to call back with questions or concerns? Yes

## 2021-01-24 NOTE — Telephone Encounter (Signed)
Verbals given today along with parameters.

## 2021-01-27 ENCOUNTER — Other Ambulatory Visit: Payer: Self-pay | Admitting: Internal Medicine

## 2021-01-27 MED ORDER — ONDANSETRON HCL 4 MG PO TABS: 4.0000 mg | ORAL_TABLET | Freq: Three times a day (TID) | ORAL | 0 refills | Status: DC | PRN

## 2021-01-28 DIAGNOSIS — M419 Scoliosis, unspecified: Secondary | ICD-10-CM | POA: Diagnosis not present

## 2021-01-28 DIAGNOSIS — K56609 Unspecified intestinal obstruction, unspecified as to partial versus complete obstruction: Secondary | ICD-10-CM | POA: Diagnosis not present

## 2021-01-28 DIAGNOSIS — M48061 Spinal stenosis, lumbar region without neurogenic claudication: Secondary | ICD-10-CM | POA: Diagnosis not present

## 2021-01-28 DIAGNOSIS — I700 Atherosclerosis of aorta: Secondary | ICD-10-CM | POA: Diagnosis not present

## 2021-01-28 DIAGNOSIS — Z0189 Encounter for other specified special examinations: Secondary | ICD-10-CM | POA: Diagnosis not present

## 2021-01-28 DIAGNOSIS — K562 Volvulus: Secondary | ICD-10-CM | POA: Diagnosis not present

## 2021-01-28 NOTE — Progress Notes (Signed)
Subjective:    Patient ID: Laura Barnes, female    DOB: 03/24/1941, 80 y.o.   MRN: [id number]  HPI The patient is here for follow up from the hosp  Admitted 8/13 - 8/24 for SBO  Recommendations for Outpatient Follow-up:  Follow up with PCP in 1 week with repeat CBC/BMP Outpatient follow-up with general surgery.  Wound care as per general surgery recommendations. Follow up in ED if symptoms worsen or new appear   Presented with generalized abd pain found to have SBO.  She was managed conservatively but ended up needed laproscopic lysis of adhesions and small bowel resection w/ colon serosal repair on 8/16.  She gradually improved.  Diet advanced and tolerated it well for d/c to home.    SBO: Treateed consservatively initially but needed laproscopic lyssis of adhension and small bowel resection w/ colon serosal repair on 8/16 Condition improved Postop ileus resolved Diet advanced F/u with surgery as outpatient  Acute on chronic diastolic HF Had significant LE edema Trated with IV lasix Improved - deferred further lasix Triamterene/hctz resumed and continue on d/c Fluid restriction of 1500cc a day  Hypoakemia Continue potassium supp BMP today  Left labial swelling Improved Patient d/w dr bass/gyn - Ct showed no abscess  Hypertension Continue home regimen  Hypothyroidism Continue home synthroid  Fibromyalgia, depression Resume home medications   Since coming home she has been very nauseous.  She has not vomited.  She is taking the zofran 4 mg TID.  She will be picking up the 8 mg dose today, which will help more.  Eating small amounts, bland diet. Drinking plenty of fluids.  Has GERD.  She has a bad taste in her mouth and discomfort in her throat.   BMs back to normal.  Sees surgery end of September.  Leg swelling improved.  No swelling now.  Occ with laying down has sob -sometimes goes away with sitting up.      Medications and allergies reviewed with  patient and updated if appropriate.  Patient Active Problem List   Diagnosis Date Noted   Small bowel strangulation s/p jejunal resection 8/16/202 01/15/2021   Encounter for imaging study to confirm nasogastric tube placement    SBO (small bowel obstruction) ([place]) 01/11/2021   Rash and other nonspecific skin eruption 12/24/2020   Adverse effect of other drugs, medicaments and biological substances, subsequent encounter 12/24/2020   Other allergic rhinitis 12/24/2020   Skin tear of right upper extremity 10/22/2020   Cellulitis 10/22/2020   Allergic reaction 10/18/2020   Aortic atherosclerosis ([place]) 03/29/2020   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/11/2019   Scoliosis of thoracolumbar spine 07/11/2019   Choking 03/24/2019   Hypokalemia 03/24/2019   Fatigue 12/31/2018   Epistaxis, recurrent 08/08/2018   Degenerative arthritis of knee, bilateral 07/05/2018   Protrusion of lumbar intervertebral disc 04/20/2018   Difficulty urinating 11/24/2017   Dizziness 08/11/2017   Sacroiliac pain 06/17/2017   Greater trochanteric bursitis of right hip 05/27/2017   Vertigo 04/29/2017   Chronic headaches 12/31/2016   Dysphagia 10/28/2016   Lump of skin 05/11/2016   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   Chronic meniscal tear of knee 03/05/2016   Nausea 03/04/2016   Osteopenia 12/03/2015   Thyroid nodule 11/16/2015   Bilateral leg edema 11/05/2015   Anterior neck pain 11/05/2015   Hormone replacement therapy (HRT) 11/05/2015   Subacromial bursitis 09/16/2015   Spondylosis 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 myelopathy or radiculopathy 03/12/2015   Trochanteric bursitis of  both hips 03/12/2015   Subacromial impingement of left shoulder 03/12/2015   Hyperlipidemia 05/01/2014   Chronic pain syndrome 05/01/2014   Lumbar radiculopathy 03/27/2014   Left shoulder pain 11/07/2013   Degeneration of intervertebral disc of cervical region 10/10/2013   Fibromyalgia 09/06/2013   Bilateral shoulder pain  09/06/2013   Chronic cough 04/12/2013   Sinusitis, chronic 04/12/2013   Hypothyroidism 04/07/2012   Chronic venous insufficiency 04/02/2010   Enthesopathy of hip region 07/19/2007   Osteoporosis 07/19/2007   Anxiety 01/23/2007   Depression 01/23/2007   Migraine 01/23/2007   NINAR (noninfectious nonallergic rhinitis) 01/23/2007   Gastroesophageal reflux disease 01/23/2007   IBS 01/23/2007    Current Outpatient Medications on File Prior to Visit  Medication Sig Dispense Refill   acetaminophen (TYLENOL) 500 MG tablet Take 2 tablets (1,000 mg total) by mouth every 6 (six) hours as needed for mild pain, moderate pain, fever or headache. 30 tablet 0   ALPRAZolam (XANAX) 0.5 MG tablet Take 1 tablet (0.5 mg total) by mouth 3 (three) times daily as needed for anxiety. 90 tablet 0   Amino Acids (AMINO ACID PO) Take 1 capsule by mouth 2 (two) times daily.     Ascorbic Acid (VITAMIN C) 1000 MG tablet Take 1,000 mg by mouth 2 (two) times daily.     B Complex-Biotin-FA (B COMPLETE) TABS Take 1 tablet by mouth daily. B Complete '100mg'$      benzonatate (TESSALON) 200 MG capsule Take 1 capsule (200 mg total) by mouth 3 (three) times daily as needed for cough. TAKE 1 CAPSULE(200 MG) BY MOUTH THREE TIMES DAILY AS NEEDED     buPROPion (WELLBUTRIN XL) 300 MG 24 hr tablet TAKE 1 TABLET(300 MG) BY MOUTH DAILY (Patient taking differently: Take 300 mg by mouth daily.) 90 tablet 1   Cholecalciferol (VITAMIN D3) 1000 units CAPS Take 1,000 Units by mouth daily.     Coenzyme Q10 (CO Q-10) 200 MG CAPS Take 1 capsule by mouth 2 (two) times daily.     ECHINACEA PO Take 1 tablet by mouth 2 (two) times daily.     ELDERBERRY PO Take by mouth daily.     estradiol (ESTRACE) 0.5 MG tablet TAKE 1 TABLET(0.5 MG) BY MOUTH DAILY 90 tablet 1   fluticasone (FLONASE) 50 MCG/ACT nasal spray Place 2 sprays into both nostrils as needed.      Green Tea, Camellia sinensis, POWD Take 1 capsule by mouth daily.     Homeopathic Products  (ARNICA [place]) PLLT Take by mouth daily.     hydrOXYzine (VISTARIL) 25 MG capsule Take 1 capsule (25 mg total) by mouth every 8 (eight) hours as needed for itching. 30 capsule 0   KRILL OIL PO Take by mouth daily.     LECITHIN PO Take 1 tablet by mouth daily.     levothyroxine (SYNTHROID) 75 MCG tablet TAKE 1 TABLET BY MOUTH EVERY MORNING (Patient taking differently: Take 75 mcg by mouth daily before breakfast. TAKE 1 TABLET BY MOUTH EVERY MORNING) 90 tablet 1   MAGNESIUM ASPARTATE PO Take 1 tablet by mouth 2 (two) times daily.     meclizine (ANTIVERT) 25 MG tablet Take 1 tablet (25 mg total) by mouth 3 (three) times daily as needed for dizziness. 60 tablet 5   Misc. Devices (ROLLATOR) MISC Use rollator for ambulation 1 each 0   Multiple Vitamins-Minerals (ZINC PO) Take by mouth daily.     ondansetron (ZOFRAN) 4 MG tablet Take 1 tablet (4 mg total) by mouth  every 8 (eight) hours as needed for nausea. 90 tablet 0   ondansetron (ZOFRAN-ODT) 8 MG disintegrating tablet DISSOLVE 1 TABLET ON THE TONGUE EVERY 8 HOURS AS NEEDED FOR NAUSEA OR VOMITTING 90 tablet 0   POTASSIUM AMINOBENZOATE PO Take 1 tablet by mouth daily.     triamterene-hydrochlorothiazide (MAXZIDE-25) 37.5-25 MG tablet TAKE 1 TABLET BY MOUTH EVERY DAY (Patient taking differently: Take 1 tablet by mouth daily.) 90 tablet 1   TURMERIC PO Take 1 tablet by mouth 2 (two) times daily.      vitamin E 400 UNIT capsule Take 400 Units by mouth daily.     No current facility-administered medications on file prior to visit.    Past Medical History:  Diagnosis Date   ALLERGIC RHINITIS    Allergy    Anemia    ANXIETY    Arthritis    hands   Bronchitis    Chronic fatigue fibromyalgia syndrome    Complication of anesthesia    says one time waking up she couldn't breathe, felt like her throat closing up   DEPRESSION    Enthesopathy of hip region    Family history of anesthesia complication    sister with n/v   FOOT PAIN, BILATERAL     GERD    Hiatal hernia    HYPERLIPIDEMIA    Hypothyroidism    IBS    MIGRAINE HEADACHE    MIGRAINE, COMMON    NECK MASS    OSTEOPENIA    OSTEOPOROSIS    PLANTAR FASCIITIS    RASH-NONVESICULAR    SINUSITIS- ACUTE-NOS    SKIN LESION    Urticaria    VAGINITIS    VENOUS INSUFFICIENCY, CHRONIC     Past Surgical History:  Procedure Laterality Date   APPENDECTOMY     BREAST ENHANCEMENT SURGERY     BREAST IMPLANT REMOVAL     CHOLECYSTECTOMY     COLONOSCOPY     FOOT SURGERY  11/06/2016   LAPAROSCOPY N/A 01/14/2021   Procedure: LAPAROSCOPY DIAGNOSTIC, LYSIS OF ADHESIONS, laparoscopic assisted open small bowel resection.;  Surgeon: Michael Boston, MD;  Location: [facility];  Service: General;  Laterality: N/A;   MASTECTOMY     bilateral for severe bilateral fibrocystic disease   OOPHORECTOMY     s/p neck lump removal     SHOULDER SURGERY     SINUS ENDO W/FUSION Right 06/30/2013   Procedure: RIGHT ENDOSCOPIC SPHENOIDECTOMY WITH FUSION SCAN;  Surgeon: [name], MD;  Location: [facility];  Service: ENT;  Laterality: Right;   TONSILLECTOMY     VAGINAL HYSTERECTOMY      Social History   Socioeconomic History   Marital status: Widowed    Spouse name: Not on file   Number of children: 2   Years of education: Not on file   Highest education level: Not on file  Occupational History   Occupation: retired Press photographer  Tobacco Use   Smoking status: Never   Smokeless tobacco: Never  Vaping Use   Vaping Use: Never used  Substance and Sexual Activity   Alcohol use: No   Drug use: No   Sexual activity: Not Currently  Other Topics Concern   Not on file  Social History Narrative   Has 2 biological children and 1 step child   Social Determinants of Health   Financial Resource Strain: Not on file  Food Insecurity: Not on file  Transportation Needs: Not on file  Physical Activity: Not on file  Stress: Not on file  Social Connections: Not on file    Family History  Problem Relation  Age of Onset   Diabetes Mother    Heart disease Father    Diabetes Father    Diabetes Sister    Heart disease Maternal Aunt    Heart disease Maternal Uncle        x 2   Kidney disease Paternal Uncle        questionable   Asthma Maternal Grandmother    Breast cancer Maternal Grandmother    Heart disease Son    Healthy Son    Irritable bowel syndrome Son    Colon cancer Neg Hx    Colon polyps Neg Hx    Rectal cancer Neg Hx    Stomach cancer Neg Hx     Review of Systems  Constitutional:  Negative for chills and fever.  Respiratory:  Positive for cough and shortness of breath (occ when laying down). Negative for wheezing.   Cardiovascular:  Positive for leg swelling. Negative for chest pain and palpitations.  Gastrointestinal:  Positive for abdominal distention (swelling), abdominal pain and nausea. Negative for blood in stool (no black stool), constipation, diarrhea and vomiting.       Gerd daily  Neurological:  Positive for headaches.      Objective:   Vitals:   01/29/21 1452  BP: (!) 142/72  Pulse: (!) 102  Temp: 98.1 F (36.7 C)  SpO2: 97%   BP Readings from Last 3 Encounters:  01/29/21 (!) 142/72  01/22/21 (!) 148/60  12/24/20 140/74   Wt Readings from Last 3 Encounters:  01/29/21 119 lb (54 kg)  01/11/21 126 lb (57.2 kg)  12/24/20 128 lb 12.8 oz (58.4 kg)   Body mass index is 23.44 kg/m.   Physical Exam    Constitutional: Appears well-developed and well-nourished. No distress.  HENT:  Head: Normocephalic and atraumatic.  Neck: Neck supple. No tracheal deviation present. No thyromegaly present.  No cervical lymphadenopathy Oropharynx:  white coating on tongue Cardiovascular: Normal rate, regular rhythm and normal heart sounds.   No murmur heard. No carotid bruit .  No edema Pulmonary/Chest: Effort normal and breath sounds normal. No respiratory distress. No has no wheezes. No rales.  Abdomen:   Skin: Skin is warm and dry. Not diaphoretic.   Psychiatric: Normal mood and affect. Behavior is normal.   Lab Results  Component Value Date   WBC 16.8 (H) 01/22/2021   HGB 9.8 (L) 01/22/2021   HCT 28.2 (L) 01/22/2021   PLT 367 01/22/2021   GLUCOSE 114 (H) 01/21/2021   CHOL 271 (H) 03/29/2020   TRIG 87.0 03/29/2020   HDL 103.80 03/29/2020   LDLDIRECT 119.6 04/06/2013   LDLCALC 149 (H) 03/29/2020   ALT 18 01/20/2021   AST 24 01/20/2021   NA 137 01/21/2021   K 3.1 (L) 01/21/2021   CL 94 (L) 01/21/2021   CREATININE 0.54 01/21/2021   BUN <5 (L) 01/21/2021   CO2 34 (H) 01/21/2021   TSH 0.996 01/11/2021   HGBA1C 5.3 01/11/2021    CT ABDOMEN PELVIS W CONTRAST Addendum: ADDENDUM REPORT: 01/19/2021 14:13   ADDENDUM:  Although the most inferior aspect of the labia was excluded from the  field of view, which would include the skin and a small amount of  subcutaneous soft tissue, the vast majority of the perineum is  visualized. There is no evidence of an abscess or significant  inflammation.   Electronically Signed    By: Dedra Skeens.D.  On: 01/19/2021 14:13 Narrative: CLINICAL DATA:  Abdominal pain and swelling. Status post laparoscopy on 01/14/2021 with lysis of adhesions and small bowel resection.  EXAM: CT ABDOMEN AND PELVIS WITH CONTRAST  TECHNIQUE: Multidetector CT imaging of the abdomen and pelvis was performed using the standard protocol following bolus administration of intravenous contrast.  CONTRAST:  11m OMNIPAQUE IOHEXOL 350 MG/ML SOLN  COMPARISON:  01/11/2021  FINDINGS: Lower chest: Small, right greater than left, pleural effusions. Associated dependent lower lobe opacity consistent with atelectasis.  Hepatobiliary: Normal liver. Status post cholecystectomy. Mild intra and extrahepatic bile duct dilation. Common bile duct with maximum diameter of 9 mm, demonstrating distal tapering. Findings are similar to the prior CT.  Pancreas: No mass or inflammation.  No duct dilation.  Spleen:  Normal in size without focal abnormality.  Adrenals/Urinary Tract: No adrenal masses. Kidneys normal size, orientation and position with symmetric enhancement and excretion. Small low-attenuation area along the posterior upper pole the right kidney. This may reflect an area of prior scarring or a small cyst or a combination. No other renal masses or lesions. No stones. No hydronephrosis. Normal ureters.  Bladder is mildly distended. No wall thickening or mass. No stone. Non dependent air noted in the anterior bladder.  Stomach/Bowel: Stomach is decompressed, otherwise unremarkable.  Small bowel is normal in caliber. No wall thickening. There is a decompressed loop of small bowel in the left mid abdomen where there is a small bowel anastomosis staple line. This is surrounded by hazy increased attenuation in the mesenteric fat. There is no extraluminal air. No defined fluid collection to suggest an abscess.  Left colon is decompressed. Right colon normal in caliber. No colonic wall thickening or evidence of inflammation. Scattered sigmoid colon diverticula.  Vascular/Lymphatic: Mild aortic atherosclerosis. No aneurysm. Vascular structures otherwise unremarkable.  No enlarged lymph nodes.  Reproductive: Status post hysterectomy. No adnexal masses.  Other: Small areas of subcutaneous air noted along the right mid abdomen, in the lateral to the left ilium. Small amount of air adjacent to the rectus abdominus muscle on the right, in the lower abdomen, and rectus abdominus muscle in the upper abdomen on the left. These areas of air have presumably tract from the laparoscopic insertion sites. No abdominal wall hernia. No ascites.  Diffuse subcutaneous edema extends from the mid abdomen to pelvis proximal lower extremities.  Musculoskeletal: No fracture or acute finding.  No bone lesion.  IMPRESSION: 1. No evidence of bowel obstruction. 2. Hazy opacity within the fat adjacent to a  small bowel anastomosis in the left mid abdomen. This is consistent with postoperative edema. No defined fluid collection to suggest an abscess. 3. No ascites or free intraperitoneal air. 4. Bilateral pleural effusions, dependent lower lobe atelectasis and diffuse subcutaneous edema in the mid to lower abdomen and pelvis.  Electronically Signed: By: DLajean ManesM.D. On: 01/19/2021 12:33  Lab Results  Component Value Date   WBC 11.5 (H) 01/29/2021   HGB 11.5 (L) 01/29/2021   HCT 34.0 (L) 01/29/2021   PLT 633.0 (H) 01/29/2021   GLUCOSE 128 (H) 01/29/2021   CHOL 271 (H) 03/29/2020   TRIG 87.0 03/29/2020   HDL 103.80 03/29/2020   LDLDIRECT 119.6 04/06/2013   LDLCALC 149 (H) 03/29/2020   ALT 18 01/20/2021   AST 24 01/20/2021   NA 134 (L) 01/29/2021   K 3.9 01/29/2021   CL 98 01/29/2021   CREATININE 0.66 01/29/2021   BUN 11 01/29/2021   CO2 27 01/29/2021   TSH 0.996  01/11/2021   HGBA1C 5.3 01/11/2021     Assessment & Plan:    See Problem List for Assessment and Plan of chronic medical problems.    This visit occurred during the SARS-CoV-2 public health emergency.  Safety protocols were in place, including screening questions prior to the visit, additional usage of staff PPE, and extensive cleaning of exam room while observing appropriate contact time as indicated for disinfecting solutions.

## 2021-01-29 ENCOUNTER — Ambulatory Visit (INDEPENDENT_AMBULATORY_CARE_PROVIDER_SITE_OTHER): Payer: Medicare Other | Admitting: Internal Medicine

## 2021-01-29 ENCOUNTER — Encounter: Payer: Self-pay | Admitting: Internal Medicine

## 2021-01-29 ENCOUNTER — Other Ambulatory Visit: Payer: Self-pay

## 2021-01-29 ENCOUNTER — Other Ambulatory Visit: Payer: Self-pay | Admitting: Internal Medicine

## 2021-01-29 VITALS — BP 142/72 | HR 102 | Temp 98.1°F | Ht 59.75 in | Wt 119.0 lb

## 2021-01-29 DIAGNOSIS — E876 Hypokalemia: Secondary | ICD-10-CM | POA: Diagnosis not present

## 2021-01-29 DIAGNOSIS — K56609 Unspecified intestinal obstruction, unspecified as to partial versus complete obstruction: Secondary | ICD-10-CM

## 2021-01-29 DIAGNOSIS — I2510 Atherosclerotic heart disease of native coronary artery without angina pectoris: Secondary | ICD-10-CM | POA: Diagnosis not present

## 2021-01-29 DIAGNOSIS — B370 Candidal stomatitis: Secondary | ICD-10-CM | POA: Insufficient documentation

## 2021-01-29 DIAGNOSIS — R110 Nausea: Secondary | ICD-10-CM | POA: Diagnosis not present

## 2021-01-29 DIAGNOSIS — K219 Gastro-esophageal reflux disease without esophagitis: Secondary | ICD-10-CM | POA: Diagnosis not present

## 2021-01-29 DIAGNOSIS — R600 Localized edema: Secondary | ICD-10-CM | POA: Diagnosis not present

## 2021-01-29 DIAGNOSIS — R0600 Dyspnea, unspecified: Secondary | ICD-10-CM | POA: Diagnosis not present

## 2021-01-29 LAB — CBC WITH DIFFERENTIAL/PLATELET
Basophils Absolute: 0.1 10*3/uL (ref 0.0–0.1)
Basophils Relative: 0.6 % (ref 0.0–3.0)
Eosinophils Absolute: 0.2 10*3/uL (ref 0.0–0.7)
Eosinophils Relative: 1.5 % (ref 0.0–5.0)
HCT: 34 % — ABNORMAL LOW (ref 36.0–46.0)
Hemoglobin: 11.5 g/dL — ABNORMAL LOW (ref 12.0–15.0)
Lymphocytes Relative: 22.4 % (ref 12.0–46.0)
Lymphs Abs: 2.6 10*3/uL (ref 0.7–4.0)
MCHC: 33.9 g/dL (ref 30.0–36.0)
MCV: 99.4 fl (ref 78.0–100.0)
Monocytes Absolute: 1.3 10*3/uL — ABNORMAL HIGH (ref 0.1–1.0)
Monocytes Relative: 11.6 % (ref 3.0–12.0)
Neutro Abs: 7.3 10*3/uL (ref 1.4–7.7)
Neutrophils Relative %: 63.9 % (ref 43.0–77.0)
Platelets: 633 10*3/uL — ABNORMAL HIGH (ref 150.0–400.0)
RBC: 3.42 Mil/uL — ABNORMAL LOW (ref 3.87–5.11)
RDW: 15.2 % (ref 11.5–15.5)
WBC: 11.5 10*3/uL — ABNORMAL HIGH (ref 4.0–10.5)

## 2021-01-29 LAB — BASIC METABOLIC PANEL
BUN: 11 mg/dL (ref 6–23)
CO2: 27 mEq/L (ref 19–32)
Calcium: 9.6 mg/dL (ref 8.4–10.5)
Chloride: 98 mEq/L (ref 96–112)
Creatinine, Ser: 0.66 mg/dL (ref 0.40–1.20)
GFR: 83.04 mL/min (ref >60.00)
Glucose, Bld: 128 mg/dL — ABNORMAL HIGH (ref 70–99)
Potassium: 3.9 mEq/L (ref 3.5–5.1)
Sodium: 134 mEq/L — ABNORMAL LOW (ref 135–145)

## 2021-01-29 MED ORDER — NYSTATIN 100000 UNIT/ML MT SUSP: 5.0000 mL | Freq: Four times a day (QID) | OROMUCOSAL | 0 refills | Status: DC

## 2021-01-29 NOTE — Assessment & Plan Note (Signed)
Chronic Mild, non-obstructive No angina like symptoms Statin intolerant

## 2021-01-29 NOTE — Patient Instructions (Addendum)
  Blood work was ordered.     Medications changes include :   none - take 40 mg of the omeprazole twice daily.   Nysatin swish and spit for your mouth.     A Echo  was ordered.    Someone from their office will call you to schedule an appointment.      Please followup in 6 months

## 2021-01-29 NOTE — Assessment & Plan Note (Signed)
Chronic Not controlled Having daily GERD and increased nausea Continue zofran tid prn - will try 8 mg dose Taking omeprazole, but consistently or long enough - advised to take 40 mg bid until symptoms controlled and then slowly taper down Call if no improvement

## 2021-01-29 NOTE — Assessment & Plan Note (Signed)
While in hospital  It was repleted cmp

## 2021-01-29 NOTE — Assessment & Plan Note (Signed)
Acute Has white coating on tongue and symptoms suggestive of thrush Nystatin S&S 4/day x 10 days

## 2021-01-29 NOTE — Assessment & Plan Note (Signed)
Chronic Increased - not controlled GERD not controlled which is likely contributing zofran 8 mg tid prn Omeprazole 40 mg bid until GERD controlled

## 2021-01-29 NOTE — Assessment & Plan Note (Signed)
Acute Recent hospitalization - initially conservatively treated, but required surgery - lysis of adhesions Recovering well Bowels now back to normal Wound healing well To see surgery end of September Cbc, bmp

## 2021-01-30 DIAGNOSIS — F32A Depression, unspecified: Secondary | ICD-10-CM | POA: Diagnosis not present

## 2021-01-30 DIAGNOSIS — I700 Atherosclerosis of aorta: Secondary | ICD-10-CM | POA: Diagnosis not present

## 2021-01-30 DIAGNOSIS — Z48815 Encounter for surgical aftercare following surgery on the digestive system: Secondary | ICD-10-CM | POA: Diagnosis not present

## 2021-01-30 DIAGNOSIS — I110 Hypertensive heart disease with heart failure: Secondary | ICD-10-CM | POA: Diagnosis not present

## 2021-01-30 DIAGNOSIS — I872 Venous insufficiency (chronic) (peripheral): Secondary | ICD-10-CM | POA: Diagnosis not present

## 2021-01-30 DIAGNOSIS — I5033 Acute on chronic diastolic (congestive) heart failure: Secondary | ICD-10-CM | POA: Diagnosis not present

## 2021-01-30 MED ORDER — ONDANSETRON 8 MG PO TBDP: ORAL_TABLET | ORAL | 0 refills | Status: DC

## 2021-02-05 DIAGNOSIS — I700 Atherosclerosis of aorta: Secondary | ICD-10-CM | POA: Diagnosis not present

## 2021-02-05 DIAGNOSIS — I110 Hypertensive heart disease with heart failure: Secondary | ICD-10-CM | POA: Diagnosis not present

## 2021-02-05 DIAGNOSIS — F32A Depression, unspecified: Secondary | ICD-10-CM | POA: Diagnosis not present

## 2021-02-05 DIAGNOSIS — Z48815 Encounter for surgical aftercare following surgery on the digestive system: Secondary | ICD-10-CM | POA: Diagnosis not present

## 2021-02-05 DIAGNOSIS — I5033 Acute on chronic diastolic (congestive) heart failure: Secondary | ICD-10-CM | POA: Diagnosis not present

## 2021-02-05 DIAGNOSIS — I872 Venous insufficiency (chronic) (peripheral): Secondary | ICD-10-CM | POA: Diagnosis not present

## 2021-02-07 DIAGNOSIS — F32A Depression, unspecified: Secondary | ICD-10-CM | POA: Diagnosis not present

## 2021-02-07 DIAGNOSIS — Z48815 Encounter for surgical aftercare following surgery on the digestive system: Secondary | ICD-10-CM | POA: Diagnosis not present

## 2021-02-07 DIAGNOSIS — I5033 Acute on chronic diastolic (congestive) heart failure: Secondary | ICD-10-CM | POA: Diagnosis not present

## 2021-02-07 DIAGNOSIS — I110 Hypertensive heart disease with heart failure: Secondary | ICD-10-CM | POA: Diagnosis not present

## 2021-02-07 DIAGNOSIS — I872 Venous insufficiency (chronic) (peripheral): Secondary | ICD-10-CM | POA: Diagnosis not present

## 2021-02-07 DIAGNOSIS — I700 Atherosclerosis of aorta: Secondary | ICD-10-CM | POA: Diagnosis not present

## 2021-02-12 DIAGNOSIS — I872 Venous insufficiency (chronic) (peripheral): Secondary | ICD-10-CM | POA: Diagnosis not present

## 2021-02-12 DIAGNOSIS — F32A Depression, unspecified: Secondary | ICD-10-CM | POA: Diagnosis not present

## 2021-02-12 DIAGNOSIS — Z48815 Encounter for surgical aftercare following surgery on the digestive system: Secondary | ICD-10-CM | POA: Diagnosis not present

## 2021-02-12 DIAGNOSIS — I110 Hypertensive heart disease with heart failure: Secondary | ICD-10-CM | POA: Diagnosis not present

## 2021-02-12 DIAGNOSIS — I5033 Acute on chronic diastolic (congestive) heart failure: Secondary | ICD-10-CM | POA: Diagnosis not present

## 2021-02-12 DIAGNOSIS — I700 Atherosclerosis of aorta: Secondary | ICD-10-CM | POA: Diagnosis not present

## 2021-02-13 DIAGNOSIS — I110 Hypertensive heart disease with heart failure: Secondary | ICD-10-CM | POA: Diagnosis not present

## 2021-02-13 DIAGNOSIS — F32A Depression, unspecified: Secondary | ICD-10-CM | POA: Diagnosis not present

## 2021-02-13 DIAGNOSIS — I872 Venous insufficiency (chronic) (peripheral): Secondary | ICD-10-CM | POA: Diagnosis not present

## 2021-02-13 DIAGNOSIS — Z48815 Encounter for surgical aftercare following surgery on the digestive system: Secondary | ICD-10-CM | POA: Diagnosis not present

## 2021-02-13 DIAGNOSIS — I5033 Acute on chronic diastolic (congestive) heart failure: Secondary | ICD-10-CM | POA: Diagnosis not present

## 2021-02-13 DIAGNOSIS — I700 Atherosclerosis of aorta: Secondary | ICD-10-CM | POA: Diagnosis not present
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
## 2021-02-17 DIAGNOSIS — Z602 Problems related to living alone: Secondary | ICD-10-CM | POA: Diagnosis not present

## 2021-02-17 DIAGNOSIS — I872 Venous insufficiency (chronic) (peripheral): Secondary | ICD-10-CM | POA: Diagnosis not present

## 2021-02-17 DIAGNOSIS — I5033 Acute on chronic diastolic (congestive) heart failure: Secondary | ICD-10-CM | POA: Diagnosis not present

## 2021-02-17 DIAGNOSIS — I700 Atherosclerosis of aorta: Secondary | ICD-10-CM | POA: Diagnosis not present

## 2021-02-17 DIAGNOSIS — F32A Depression, unspecified: Secondary | ICD-10-CM | POA: Diagnosis not present

## 2021-02-17 DIAGNOSIS — I110 Hypertensive heart disease with heart failure: Secondary | ICD-10-CM | POA: Diagnosis not present

## 2021-02-17 DIAGNOSIS — Z48815 Encounter for surgical aftercare following surgery on the digestive system: Secondary | ICD-10-CM | POA: Diagnosis not present

## 2021-02-17 DIAGNOSIS — F419 Anxiety disorder, unspecified: Secondary | ICD-10-CM

## 2021-02-18 DIAGNOSIS — F32A Depression, unspecified: Secondary | ICD-10-CM | POA: Diagnosis not present

## 2021-02-18 DIAGNOSIS — Z48815 Encounter for surgical aftercare following surgery on the digestive system: Secondary | ICD-10-CM | POA: Diagnosis not present

## 2021-02-18 DIAGNOSIS — I110 Hypertensive heart disease with heart failure: Secondary | ICD-10-CM | POA: Diagnosis not present

## 2021-02-18 DIAGNOSIS — I700 Atherosclerosis of aorta: Secondary | ICD-10-CM | POA: Diagnosis not present

## 2021-02-18 DIAGNOSIS — I872 Venous insufficiency (chronic) (peripheral): Secondary | ICD-10-CM | POA: Diagnosis not present

## 2021-02-18 DIAGNOSIS — I5033 Acute on chronic diastolic (congestive) heart failure: Secondary | ICD-10-CM | POA: Diagnosis not present

## 2021-02-20 ENCOUNTER — Encounter: Payer: Self-pay | Admitting: Internal Medicine

## 2021-02-21 DIAGNOSIS — I110 Hypertensive heart disease with heart failure: Secondary | ICD-10-CM | POA: Diagnosis not present

## 2021-02-21 DIAGNOSIS — F32A Depression, unspecified: Secondary | ICD-10-CM | POA: Diagnosis not present

## 2021-02-21 DIAGNOSIS — Z48815 Encounter for surgical aftercare following surgery on the digestive system: Secondary | ICD-10-CM | POA: Diagnosis not present

## 2021-02-21 DIAGNOSIS — I872 Venous insufficiency (chronic) (peripheral): Secondary | ICD-10-CM | POA: Diagnosis not present

## 2021-02-21 DIAGNOSIS — I5033 Acute on chronic diastolic (congestive) heart failure: Secondary | ICD-10-CM | POA: Diagnosis not present

## 2021-02-21 DIAGNOSIS — I700 Atherosclerosis of aorta: Secondary | ICD-10-CM | POA: Diagnosis not present

## 2021-02-26 ENCOUNTER — Other Ambulatory Visit: Payer: Self-pay | Admitting: Internal Medicine

## 2021-02-26 ENCOUNTER — Ambulatory Visit: Payer: Medicare Other | Admitting: Rheumatology

## 2021-02-26 DIAGNOSIS — E041 Nontoxic single thyroid nodule: Secondary | ICD-10-CM

## 2021-02-26 DIAGNOSIS — M8589 Other specified disorders of bone density and structure, multiple sites: Secondary | ICD-10-CM

## 2021-02-26 DIAGNOSIS — M797 Fibromyalgia: Secondary | ICD-10-CM

## 2021-02-26 DIAGNOSIS — E039 Hypothyroidism, unspecified: Secondary | ICD-10-CM

## 2021-02-26 DIAGNOSIS — Z79890 Hormone replacement therapy (postmenopausal): Secondary | ICD-10-CM

## 2021-02-26 DIAGNOSIS — M7542 Impingement syndrome of left shoulder: Secondary | ICD-10-CM

## 2021-02-26 DIAGNOSIS — M5030 Other cervical disc degeneration, unspecified cervical region: Secondary | ICD-10-CM

## 2021-02-26 DIAGNOSIS — I872 Venous insufficiency (chronic) (peripheral): Secondary | ICD-10-CM

## 2021-02-26 DIAGNOSIS — Z8719 Personal history of other diseases of the digestive system: Secondary | ICD-10-CM

## 2021-02-26 DIAGNOSIS — G5702 Lesion of sciatic nerve, left lower limb: Secondary | ICD-10-CM

## 2021-02-26 DIAGNOSIS — M19041 Primary osteoarthritis, right hand: Secondary | ICD-10-CM

## 2021-02-26 DIAGNOSIS — M7061 Trochanteric bursitis, right hip: Secondary | ICD-10-CM

## 2021-02-26 DIAGNOSIS — M5136 Other intervertebral disc degeneration, lumbar region: Secondary | ICD-10-CM

## 2021-02-26 DIAGNOSIS — Z8639 Personal history of other endocrine, nutritional and metabolic disease: Secondary | ICD-10-CM

## 2021-02-26 DIAGNOSIS — G894 Chronic pain syndrome: Secondary | ICD-10-CM

## 2021-02-26 DIAGNOSIS — F32A Depression, unspecified: Secondary | ICD-10-CM

## 2021-02-28 DIAGNOSIS — Z48815 Encounter for surgical aftercare following surgery on the digestive system: Secondary | ICD-10-CM | POA: Diagnosis not present

## 2021-02-28 DIAGNOSIS — I5033 Acute on chronic diastolic (congestive) heart failure: Secondary | ICD-10-CM | POA: Diagnosis not present

## 2021-02-28 DIAGNOSIS — I110 Hypertensive heart disease with heart failure: Secondary | ICD-10-CM | POA: Diagnosis not present

## 2021-02-28 DIAGNOSIS — I872 Venous insufficiency (chronic) (peripheral): Secondary | ICD-10-CM | POA: Diagnosis not present

## 2021-02-28 DIAGNOSIS — F32A Depression, unspecified: Secondary | ICD-10-CM | POA: Diagnosis not present

## 2021-02-28 DIAGNOSIS — I700 Atherosclerosis of aorta: Secondary | ICD-10-CM | POA: Diagnosis not present

## 2021-03-01 ENCOUNTER — Other Ambulatory Visit: Payer: Self-pay | Admitting: Internal Medicine

## 2021-03-01 DIAGNOSIS — F419 Anxiety disorder, unspecified: Secondary | ICD-10-CM

## 2021-03-03 ENCOUNTER — Encounter: Payer: Self-pay | Admitting: Internal Medicine

## 2021-03-05 DIAGNOSIS — I5033 Acute on chronic diastolic (congestive) heart failure: Secondary | ICD-10-CM | POA: Diagnosis not present

## 2021-03-05 DIAGNOSIS — I700 Atherosclerosis of aorta: Secondary | ICD-10-CM | POA: Diagnosis not present

## 2021-03-05 DIAGNOSIS — F32A Depression, unspecified: Secondary | ICD-10-CM | POA: Diagnosis not present

## 2021-03-05 DIAGNOSIS — Z48815 Encounter for surgical aftercare following surgery on the digestive system: Secondary | ICD-10-CM | POA: Diagnosis not present

## 2021-03-05 DIAGNOSIS — I872 Venous insufficiency (chronic) (peripheral): Secondary | ICD-10-CM | POA: Diagnosis not present

## 2021-03-05 DIAGNOSIS — I110 Hypertensive heart disease with heart failure: Secondary | ICD-10-CM | POA: Diagnosis not present

## 2021-03-13 DIAGNOSIS — Z48815 Encounter for surgical aftercare following surgery on the digestive system: Secondary | ICD-10-CM | POA: Diagnosis not present

## 2021-03-13 DIAGNOSIS — I5033 Acute on chronic diastolic (congestive) heart failure: Secondary | ICD-10-CM | POA: Diagnosis not present

## 2021-03-13 DIAGNOSIS — I110 Hypertensive heart disease with heart failure: Secondary | ICD-10-CM | POA: Diagnosis not present

## 2021-03-13 DIAGNOSIS — F32A Depression, unspecified: Secondary | ICD-10-CM | POA: Diagnosis not present

## 2021-03-13 DIAGNOSIS — I872 Venous insufficiency (chronic) (peripheral): Secondary | ICD-10-CM | POA: Diagnosis not present

## 2021-03-13 DIAGNOSIS — I700 Atherosclerosis of aorta: Secondary | ICD-10-CM | POA: Diagnosis not present

## 2021-03-24 DIAGNOSIS — M797 Fibromyalgia: Secondary | ICD-10-CM | POA: Diagnosis not present

## 2021-03-24 DIAGNOSIS — G894 Chronic pain syndrome: Secondary | ICD-10-CM | POA: Diagnosis not present

## 2021-03-24 DIAGNOSIS — Z9181 History of falling: Secondary | ICD-10-CM | POA: Diagnosis not present

## 2021-03-24 DIAGNOSIS — G589 Mononeuropathy, unspecified: Secondary | ICD-10-CM | POA: Diagnosis not present

## 2021-03-24 DIAGNOSIS — Z6824 Body mass index (BMI) 24.0-24.9, adult: Secondary | ICD-10-CM | POA: Diagnosis not present

## 2021-03-24 DIAGNOSIS — M5416 Radiculopathy, lumbar region: Secondary | ICD-10-CM | POA: Diagnosis not present

## 2021-03-24 DIAGNOSIS — Z79899 Other long term (current) drug therapy: Secondary | ICD-10-CM | POA: Diagnosis not present

## 2021-03-24 DIAGNOSIS — Z0000 Encounter for general adult medical examination without abnormal findings: Secondary | ICD-10-CM | POA: Diagnosis not present

## 2021-03-30 ENCOUNTER — Encounter: Payer: Self-pay | Admitting: Internal Medicine

## 2021-03-30 ENCOUNTER — Encounter: Payer: Self-pay | Admitting: Cardiovascular Disease

## 2021-03-30 NOTE — Patient Instructions (Addendum)
   Flu and pneumonia vaccines given    Blood work was ordered.     Medications changes include :   none      Please followup in 6 months
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
## 2021-03-30 NOTE — Progress Notes (Signed)
Cardiology Office Note:    Date:  03/31/2021   ID:  Laura Barnes, DOB [birth year], MRN [id number]  PCP:  Binnie Rail, MD   [facility] Providers Cardiologist:  Ena Dawley, MD , now Kataleena Holsapple  Click to update primary MD,subspecialty MD or APP then REFRESH:1}    Referring MD: Binnie Rail, MD   Chief Complaint  Patient presents with   Hyperlipidemia     Oct. 31, 2022   Laura Barnes is a 80 y.o. female previously a patient of Dr. Ena Dawley who I am seeing for the first time today for evaluation of her coronary calcifications, HTD.  She has coronary artery calcification,  has not had a cardiac cath  Has multiple allergies ( 24 med allergies)   Her last LDL was 149 in Oct. , 2021 She watches her diet  Exercises some  Had surgery for partially necrotic colon related to an old ovarian issue  Has a headache today - HR is fast  Has fibromyalgia     Past Medical History:  Diagnosis Date   ALLERGIC RHINITIS    Allergy    Anemia    ANXIETY    Arthritis    hands   Bronchitis    Chronic fatigue fibromyalgia syndrome    Complication of anesthesia    says one time waking up she couldn't breathe, felt like her throat closing up   DEPRESSION    Enthesopathy of hip region    Family history of anesthesia complication    sister with n/v   FOOT PAIN, BILATERAL    GERD    Hiatal hernia    HYPERLIPIDEMIA    Hypothyroidism    IBS    MIGRAINE HEADACHE    MIGRAINE, COMMON    NECK MASS    OSTEOPENIA    OSTEOPOROSIS    PLANTAR FASCIITIS    RASH-NONVESICULAR    SINUSITIS- ACUTE-NOS    SKIN LESION    Urticaria    VAGINITIS    VENOUS INSUFFICIENCY, CHRONIC     Past Surgical History:  Procedure Laterality Date   APPENDECTOMY     BREAST ENHANCEMENT SURGERY     BREAST IMPLANT REMOVAL     CHOLECYSTECTOMY     COLONOSCOPY     FOOT SURGERY  11/06/2016   LAPAROSCOPY N/A 01/14/2021   Procedure: LAPAROSCOPY DIAGNOSTIC, LYSIS OF ADHESIONS,  laparoscopic assisted open small bowel resection.;  Surgeon: Michael Boston, MD;  Location: [facility];  Service: General;  Laterality: N/A;   MASTECTOMY     bilateral for severe bilateral fibrocystic disease   OOPHORECTOMY     s/p neck lump removal     SHOULDER SURGERY     SINUS ENDO W/FUSION Right 06/30/2013   Procedure: RIGHT ENDOSCOPIC SPHENOIDECTOMY WITH FUSION SCAN;  Surgeon: [name], MD;  [facility];  Service: ENT;  Laterality: Right;   TONSILLECTOMY     VAGINAL HYSTERECTOMY      Current Medications: Current Meds  Medication Sig   acetaminophen (TYLENOL) 500 MG tablet Take 2 tablets (1,000 mg total) by mouth every 6 (six) hours as needed for mild pain, moderate pain, fever or headache.   ALPRAZolam (XANAX) 0.5 MG tablet TAKE 1 TABLET BY MOUTH 3 TIMES A DAY AS NEEDED ANXIETY   Amino Acids (AMINO ACID PO) Take 1 capsule by mouth 2 (two) times daily.   Ascorbic Acid (VITAMIN C) 1000 MG tablet Take 1,000 mg by mouth 2 (two) times daily.   B Complex-Biotin-FA (B  COMPLETE) TABS Take 1 tablet by mouth daily. B Complete 100mg    buPROPion (WELLBUTRIN XL) 300 MG 24 hr tablet TAKE 1 TABLET(300 MG) BY MOUTH DAILY   Cholecalciferol (VITAMIN D3) 1000 units CAPS Take 1,000 Units by mouth daily.   Coenzyme Q10 (CO Q-10) 200 MG CAPS Take 1 capsule by mouth 2 (two) times daily.   ECHINACEA PO Take 1 tablet by mouth 2 (two) times daily.   ELDERBERRY PO Take by mouth daily.   estradiol (ESTRACE) 0.5 MG tablet TAKE 1 TABLET EVERY DAY   fluticasone (FLONASE) 50 MCG/ACT nasal spray Place 2 sprays into both nostrils as needed.    Green Tea, Camellia sinensis, POWD Take 1 capsule by mouth daily.   Homeopathic Products ([place]) PLLT Take by mouth daily.   HYDROmorphone (DILAUDID) 2 MG tablet 2 mg every 4 (four) hours as needed for severe pain.   hydrOXYzine (VISTARIL) 25 MG capsule Take 1 capsule (25 mg total) by mouth every 8 (eight) hours as needed for itching.   KRILL OIL PO Take by  mouth daily.   LECITHIN PO Take 1 tablet by mouth daily.   levothyroxine (SYNTHROID) 75 MCG tablet TAKE 1 TABLET BY MOUTH EVERY MORNING   MAGNESIUM ASPARTATE PO Take 1 tablet by mouth 2 (two) times daily.   meclizine (ANTIVERT) 25 MG tablet Take 1 tablet (25 mg total) by mouth 3 (three) times daily as needed for dizziness.   Misc. Devices (ROLLATOR) MISC Use rollator for ambulation   Multiple Vitamins-Minerals (ZINC PO) Take by mouth daily.   ondansetron (ZOFRAN) 4 MG tablet Take 1 tablet (4 mg total) by mouth every 8 (eight) hours as needed for nausea.   ondansetron (ZOFRAN-ODT) 8 MG disintegrating tablet DISSOLVE 1 TABLET ON THE TONGUE EVERY 8 HOURS AS NEEDED FOR NAUSEA OR VOMITTING   POTASSIUM AMINOBENZOATE PO Take 1 tablet by mouth daily.   triamterene-hydrochlorothiazide (MAXZIDE-25) 37.5-25 MG tablet TAKE 1 TABLET BY MOUTH EVERY DAY   TURMERIC PO Take 1 tablet by mouth 2 (two) times daily.    vitamin E 400 UNIT capsule Take 400 Units by mouth daily.     Allergies:   Prednisone, Amoxicillin-pot clavulanate, Aspirin, Erythromycin, Levofloxacin, Nsaids, Statins, Sumatriptan, Adhesive [tape], Ivp dye [iodinated diagnostic agents], Lyrica [pregabalin], Methylphenidate hcl, Mirtazapine, Oxycodone, Red yeast rice [cholestin], Shellfish allergy, Soy allergy, Doxycycline, Hydrocodone, Hydrocodone-acetaminophen, Latex, Rofecoxib, Sertraline hcl, and Sulfonamide derivatives   Social History   Socioeconomic History   Marital status: Widowed    Spouse name: Not on file   Number of children: 2   Years of education: Not on file   Highest education level: Not on file  Occupational History   Occupation: retired Press photographer  Tobacco Use   Smoking status: Never   Smokeless tobacco: Never  Vaping Use   Vaping Use: Never used  Substance and Sexual Activity   Alcohol use: No   Drug use: No   Sexual activity: Not Currently  Other Topics Concern   Not on file  Social History Narrative   Has 2  biological children and 1 step child   Social Determinants of Radio broadcast assistant Strain: Not on file  Food Insecurity: Not on file  Transportation Needs: Not on file  Physical Activity: Not on file  Stress: Not on file  Social Connections: Not on file     Family History: The patient's family history includes Asthma in her maternal grandmother; Breast cancer in her maternal grandmother; Diabetes in her father, mother, and  sister; Healthy in her son; Heart disease in her father, maternal aunt, maternal uncle, and son; Irritable bowel syndrome in her son; Kidney disease in her paternal uncle. There is no history of Colon cancer, Colon polyps, Rectal cancer, or Stomach cancer.  ROS:   Please see the history of present illness.     All other systems reviewed and are negative.  EKGs/Labs/Other Studies Reviewed:    The following studies were reviewed today:   EKG:     Recent Labs: 01/11/2021: TSH 0.996 01/20/2021: ALT 18 01/21/2021: Magnesium 2.0 01/29/2021: BUN 11; Creatinine, Ser 0.66; Hemoglobin 11.5; Platelets 633.0; Potassium 3.9; Sodium 134  Recent Lipid Panel    Component Value Date/Time   CHOL 271 (H) 03/29/2020 1515   CHOL 202 (H) 07/25/2018 1151   TRIG 87.0 03/29/2020 1515   HDL 103.80 03/29/2020 1515   HDL 78 07/25/2018 1151   CHOLHDL 3 03/29/2020 1515   VLDL 17.4 03/29/2020 1515   LDLCALC 149 (H) 03/29/2020 1515   LDLCALC 105 (H) 07/25/2018 1151   LDLDIRECT 119.6 04/06/2013 1228     Risk Assessment/Calculations:           Physical Exam:    VS:  BP (!) 152/90 (BP Location: Right Arm, Patient Position: Sitting, Cuff Size: Small)   Pulse (!) 105   Ht 4' 11.75" (1.518 m)   Wt 123 lb 9.6 oz (56.1 kg)   SpO2 98%   BMI 24.34 kg/m     Wt Readings from Last 3 Encounters:  03/31/21 123 lb 9.6 oz (56.1 kg)  03/31/21 123 lb (55.8 kg)  01/29/21 119 lb (54 kg)     GEN:  Well nourished, well developed in no acute distress HEENT: Normal NECK: No JVD; No  carotid bruits LYMPHATICS: No lymphadenopathy CARDIAC: RRR, no murmurs, rubs, gallops RESPIRATORY:  Clear to auscultation without rales, wheezing or rhonchi  ABDOMEN: Soft, non-tender, non-distended MUSCULOSKELETAL:  No edema; No deformity  SKIN: Warm and dry NEUROLOGIC:  Alert and oriented x 3 PSYCHIATRIC:  Normal affect   ASSESSMENT:    1. Coronary artery disease involving native coronary artery of native heart without angina pectoris   2. Coronary artery calcification    PLAN:      Mild coronary artery calcifications: The patient was found to incidentally have coronary artery calcifications.  She has mild hyperlipidemia but cannot tolerate any statins.  She has multiple drug allergies.  She overall seems to be doing well.  She eats a good diet and generally takes care of her self.  Since we do not have her on any medicines and she is not inclined to start any cholesterol medicines we will see her on an as-needed basis.  She will continue to follow-up with Dr. Quay Burow every 6 months as she has been doing.    Medication Adjustments/Labs and Tests Ordered: Current medicines are reviewed at length with the patient today.  Concerns regarding medicines are outlined above.  No orders of the defined types were placed in this encounter.  No orders of the defined types were placed in this encounter.   Patient Instructions  Medication Instructions:  Your physician recommends that you continue on your current medications as directed. Please refer to the Current Medication list given to you today.  *If you need a refill on your cardiac medications before your next appointment, please call your pharmacy*   Lab Work: NONE If you have labs (blood work) drawn today and your tests are completely normal, you will receive your  results only by: MyChart Message (if you have MyChart) OR A paper copy in the mail If you have any lab test that is abnormal or we need to change your treatment, we  will call you to review the results.   Testing/Procedures: NONE   Follow-Up: At [facility], you and your health needs are our priority.  As part of our continuing mission to provide you with exceptional heart care, we have created designated Provider Care Teams.  These Care Teams include your primary Cardiologist (physician) and Advanced Practice Providers (APPs -  Physician Assistants and Nurse Practitioners) who all work together to provide you with the care you need, when you need it.  We recommend signing up for the patient portal called "MyChart".  Sign up information is provided on this After Visit Summary.  MyChart is used to connect with patients for Virtual Visits (Telemedicine).  Patients are able to view lab/test results, encounter notes, upcoming appointments, etc.  Non-urgent messages can be sent to your provider as well.   To learn more about what you can do with MyChart, go to [email or web address].    Your next appointment:   AS NEEDED   Signed, Mertie Moores, MD  03/31/2021 5:07 PM    [place]

## 2021-03-31 ENCOUNTER — Other Ambulatory Visit: Payer: Self-pay

## 2021-03-31 ENCOUNTER — Ambulatory Visit: Payer: Medicare Other | Admitting: Cardiovascular Disease

## 2021-03-31 ENCOUNTER — Ambulatory Visit (INDEPENDENT_AMBULATORY_CARE_PROVIDER_SITE_OTHER): Payer: Medicare Other | Admitting: Internal Medicine

## 2021-03-31 ENCOUNTER — Encounter: Payer: Self-pay | Admitting: Cardiovascular Disease

## 2021-03-31 VITALS — BP 152/90 | HR 105 | Ht 59.75 in | Wt 123.6 lb

## 2021-03-31 VITALS — BP 136/78 | HR 114 | Temp 98.5°F | Ht 59.75 in | Wt 123.0 lb

## 2021-03-31 DIAGNOSIS — F3289 Other specified depressive episodes: Secondary | ICD-10-CM

## 2021-03-31 DIAGNOSIS — Z23 Encounter for immunization: Secondary | ICD-10-CM

## 2021-03-31 DIAGNOSIS — M797 Fibromyalgia: Secondary | ICD-10-CM | POA: Diagnosis not present

## 2021-03-31 DIAGNOSIS — M810 Age-related osteoporosis without current pathological fracture: Secondary | ICD-10-CM | POA: Diagnosis not present

## 2021-03-31 DIAGNOSIS — R110 Nausea: Secondary | ICD-10-CM

## 2021-03-31 DIAGNOSIS — I2584 Coronary atherosclerosis due to calcified coronary lesion: Secondary | ICD-10-CM | POA: Diagnosis not present

## 2021-03-31 DIAGNOSIS — I2510 Atherosclerotic heart disease of native coronary artery without angina pectoris: Secondary | ICD-10-CM | POA: Diagnosis not present

## 2021-03-31 DIAGNOSIS — R600 Localized edema: Secondary | ICD-10-CM | POA: Diagnosis not present

## 2021-03-31 DIAGNOSIS — F419 Anxiety disorder, unspecified: Secondary | ICD-10-CM | POA: Diagnosis not present

## 2021-03-31 DIAGNOSIS — R202 Paresthesia of skin: Secondary | ICD-10-CM | POA: Insufficient documentation

## 2021-03-31 DIAGNOSIS — Z1331 Encounter for screening for depression: Secondary | ICD-10-CM

## 2021-03-31 DIAGNOSIS — Z79890 Hormone replacement therapy (postmenopausal): Secondary | ICD-10-CM

## 2021-03-31 DIAGNOSIS — K219 Gastro-esophageal reflux disease without esophagitis: Secondary | ICD-10-CM | POA: Diagnosis not present

## 2021-03-31 DIAGNOSIS — E039 Hypothyroidism, unspecified: Secondary | ICD-10-CM

## 2021-03-31 LAB — CBC WITH DIFFERENTIAL/PLATELET
Basophils Absolute: 0 10*3/uL (ref 0.0–0.1)
Basophils Relative: 0.4 % (ref 0.0–3.0)
Eosinophils Absolute: 0.1 10*3/uL (ref 0.0–0.7)
Eosinophils Relative: 1.1 % (ref 0.0–5.0)
HCT: 38.7 % (ref 36.0–46.0)
Hemoglobin: 12.9 g/dL (ref 12.0–15.0)
Lymphocytes Relative: 23.7 % (ref 12.0–46.0)
Lymphs Abs: 2.2 10*3/uL (ref 0.7–4.0)
MCHC: 33.3 g/dL (ref 30.0–36.0)
MCV: 96.6 fl (ref 78.0–100.0)
Monocytes Absolute: 0.9 10*3/uL (ref 0.1–1.0)
Monocytes Relative: 9.2 % (ref 3.0–12.0)
Neutro Abs: 6.2 10*3/uL (ref 1.4–7.7)
Neutrophils Relative %: 65.6 % (ref 43.0–77.0)
Platelets: 278 10*3/uL (ref 150.0–400.0)
RBC: 4.01 Mil/uL (ref 3.87–5.11)
RDW: 14.3 % (ref 11.5–15.5)
WBC: 9.4 10*3/uL (ref 4.0–10.5)

## 2021-03-31 LAB — COMPREHENSIVE METABOLIC PANEL
ALT: 17 U/L (ref 0–35)
AST: 26 U/L (ref 0–37)
Albumin: 4.2 g/dL (ref 3.5–5.2)
Alkaline Phosphatase: 39 U/L (ref 39–117)
BUN: 19 mg/dL (ref 6–23)
CO2: 29 mEq/L (ref 19–32)
Calcium: 11.1 mg/dL — ABNORMAL HIGH (ref 8.4–10.5)
Chloride: 99 mEq/L (ref 96–112)
Creatinine, Ser: 0.8 mg/dL (ref 0.40–1.20)
GFR: 69.67 mL/min (ref >60.00)
Glucose, Bld: 104 mg/dL — ABNORMAL HIGH (ref 70–99)
Potassium: 3.9 mEq/L (ref 3.5–5.1)
Sodium: 137 mEq/L (ref 135–145)
Total Bilirubin: 0.4 mg/dL (ref 0.2–1.2)
Total Protein: 7.5 g/dL (ref 6.0–8.3)

## 2021-03-31 LAB — VITAMIN D 25 HYDROXY (VIT D DEFICIENCY, FRACTURES): VITD: 49.65 ng/mL (ref 30.00–100.00)

## 2021-03-31 LAB — VITAMIN B12: Vitamin B-12: >1550 pg/mL — ABNORMAL HIGH (ref 211–911)

## 2021-03-31 LAB — TSH: TSH: 1.38 u[IU]/mL (ref 0.35–5.50)

## 2021-03-31 NOTE — Assessment & Plan Note (Signed)
Chronic Flared up last week - was worse over the past weekend Continue wellbutrin xl 300 mg daily and estradiol 0.5 mg daily Taking tylenol prn

## 2021-03-31 NOTE — Assessment & Plan Note (Signed)
Chronic Intermittent Continue prilosec 20 mg otc prn

## 2021-03-31 NOTE — Assessment & Plan Note (Signed)
Chronic Controlled, stable Continue triamterene-hctz 37.5-25 mg daily cmp

## 2021-03-31 NOTE — Assessment & Plan Note (Signed)
Chronic Controlled, stable PHQ9  Screening with score of 4 -- related to poor sleep - she feels her depression is well controlled Continue Wellbutrin xl 300 mg daily

## 2021-03-31 NOTE — Patient Instructions (Signed)
Medication Instructions:  Your physician recommends that you continue on your current medications as directed. Please refer to the Current Medication list given to you today.  *If you need a refill on your cardiac medications before your next appointment, please call your pharmacy*   Lab Work: NONE If you have labs (blood work) drawn today and your tests are completely normal, you will receive your results only by: [place] (if you have MyChart) OR A paper copy in the mail If you have any lab test that is abnormal or we need to change your treatment, we will call you to review the results.   Testing/Procedures: NONE   Follow-Up: At [facility], you and your health needs are our priority.  As part of our continuing mission to provide you with exceptional heart care, we have created designated Provider Care Teams.  These Care Teams include your primary Cardiologist (physician) and Advanced Practice Providers (APPs -  Physician Assistants and Nurse Practitioners) who all work together to provide you with the care you need, when you need it.  We recommend signing up for the patient portal called "MyChart".  Sign up information is provided on this After Visit Summary.  MyChart is used to connect with patients for Virtual Visits (Telemedicine).  Patients are able to view lab/test results, encounter notes, upcoming appointments, etc.  Non-urgent messages can be sent to your provider as well.   To learn more about what you can do with MyChart, go to [email or web address].    Your next appointment:   AS NEEDED

## 2021-03-31 NOTE — Assessment & Plan Note (Addendum)
Chronic Controlled, stable GAD7 score is 0 showing anxiety is well controlled Continue wellbutrin xl 300 mg daily, xanax 0.5 mg TID prn

## 2021-03-31 NOTE — Assessment & Plan Note (Signed)
Chronic Continue estradiol 0.5 mg daily - benefits for her outweigh her risks

## 2021-03-31 NOTE — Assessment & Plan Note (Signed)
In LLE Will check B12 level

## 2021-03-31 NOTE — Assessment & Plan Note (Signed)
Chronic Intermittent Continue zofran 8 mg tid prn - 4 mg tid prn

## 2021-03-31 NOTE — Assessment & Plan Note (Signed)
Chronic Not on medication Taking calcium and vitamin d Will check vitamin d level

## 2021-03-31 NOTE — Assessment & Plan Note (Signed)
Chronic  Clinically euthyroid Currently taking levothyroxine 75 mcg daily Check tsh  Titrate med dose if needed  

## 2021-04-02 NOTE — Addendum Note (Signed)
Addended by: Marcina Millard on: 04/02/2021 04:33 PM   Modules accepted: Orders

## 2021-04-24 ENCOUNTER — Other Ambulatory Visit: Payer: Self-pay | Admitting: Internal Medicine

## 2021-04-24 DIAGNOSIS — F419 Anxiety disorder, unspecified: Secondary | ICD-10-CM

## 2021-04-25 ENCOUNTER — Other Ambulatory Visit: Payer: Self-pay | Admitting: Internal Medicine

## 2021-04-25 DIAGNOSIS — F419 Anxiety disorder, unspecified: Secondary | ICD-10-CM

## 2021-04-28 ENCOUNTER — Encounter: Payer: Self-pay | Admitting: Internal Medicine

## 2021-04-28 MED ORDER — ALPRAZOLAM 0.5 MG PO TABS: ORAL_TABLET | ORAL | 0 refills | Status: DC

## 2021-05-06 DIAGNOSIS — Z961 Presence of intraocular lens: Secondary | ICD-10-CM | POA: Diagnosis not present

## 2021-05-06 DIAGNOSIS — H43811 Vitreous degeneration, right eye: Secondary | ICD-10-CM | POA: Diagnosis not present

## 2021-06-01 ENCOUNTER — Other Ambulatory Visit: Payer: Self-pay | Admitting: Internal Medicine

## 2021-06-23 ENCOUNTER — Other Ambulatory Visit: Payer: Self-pay | Admitting: Internal Medicine

## 2021-06-24 DIAGNOSIS — H5319 Other subjective visual disturbances: Secondary | ICD-10-CM | POA: Diagnosis not present

## 2021-06-24 DIAGNOSIS — H353111 Nonexudative age-related macular degeneration, right eye, early dry stage: Secondary | ICD-10-CM | POA: Diagnosis not present

## 2021-06-24 DIAGNOSIS — H43391 Other vitreous opacities, right eye: Secondary | ICD-10-CM | POA: Diagnosis not present

## 2021-06-24 DIAGNOSIS — H43811 Vitreous degeneration, right eye: Secondary | ICD-10-CM | POA: Diagnosis not present

## 2021-06-24 DIAGNOSIS — H21341 Primary cyst of pars plana, right eye: Secondary | ICD-10-CM | POA: Diagnosis not present

## 2021-06-24 DIAGNOSIS — H35453 Secondary pigmentary degeneration, bilateral: Secondary | ICD-10-CM | POA: Diagnosis not present

## 2021-06-24 DIAGNOSIS — H353122 Nonexudative age-related macular degeneration, left eye, intermediate dry stage: Secondary | ICD-10-CM | POA: Diagnosis not present

## 2021-06-24 DIAGNOSIS — H35363 Drusen (degenerative) of macula, bilateral: Secondary | ICD-10-CM | POA: Diagnosis not present

## 2021-07-02 ENCOUNTER — Encounter: Payer: Self-pay | Admitting: Internal Medicine
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
## 2021-07-04 ENCOUNTER — Encounter: Payer: Self-pay | Admitting: Internal Medicine

## 2021-07-04 ENCOUNTER — Ambulatory Visit (INDEPENDENT_AMBULATORY_CARE_PROVIDER_SITE_OTHER): Payer: Medicare Other | Admitting: Internal Medicine

## 2021-07-04 ENCOUNTER — Other Ambulatory Visit: Payer: Self-pay

## 2021-07-04 DIAGNOSIS — B009 Herpesviral infection, unspecified: Secondary | ICD-10-CM

## 2021-07-04 DIAGNOSIS — J018 Other acute sinusitis: Secondary | ICD-10-CM

## 2021-07-04 MED ORDER — CEPHALEXIN 500 MG PO CAPS: 500.0000 mg | ORAL_CAPSULE | Freq: Three times a day (TID) | ORAL | 0 refills | Status: DC

## 2021-07-04 NOTE — Patient Instructions (Addendum)
° ° °  Medications changes include :   cephalexin three times a day for 10 days  Your prescription(s) have been submitted to your pharmacy. Please take as directed and contact our office if you believe you are having problem(s) with the medication(s).

## 2021-07-06 DIAGNOSIS — B009 Herpesviral infection, unspecified: Secondary | ICD-10-CM | POA: Insufficient documentation

## 2021-07-06 NOTE — Assessment & Plan Note (Signed)
Acute Likely bacterial  Start keflex 500 mg TID x 10 day otc cold medications Rest, fluid Call if no improvement

## 2021-07-06 NOTE — Assessment & Plan Note (Signed)
Acute Nasal Has had in past with colds Continue symptomatic treatment

## 2021-07-07 ENCOUNTER — Encounter: Payer: Self-pay | Admitting: Internal Medicine

## 2021-07-14 ENCOUNTER — Encounter: Payer: Self-pay | Admitting: Internal Medicine
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
## 2021-07-15 ENCOUNTER — Ambulatory Visit (INDEPENDENT_AMBULATORY_CARE_PROVIDER_SITE_OTHER): Payer: Medicare Other | Admitting: Internal Medicine

## 2021-07-15 ENCOUNTER — Other Ambulatory Visit: Payer: Self-pay

## 2021-07-15 ENCOUNTER — Encounter: Payer: Self-pay | Admitting: Internal Medicine

## 2021-07-15 DIAGNOSIS — J029 Acute pharyngitis, unspecified: Secondary | ICD-10-CM

## 2021-07-15 DIAGNOSIS — K118 Other diseases of salivary glands: Secondary | ICD-10-CM | POA: Diagnosis not present

## 2021-07-15 DIAGNOSIS — R110 Nausea: Secondary | ICD-10-CM

## 2021-07-15 MED ORDER — AZITHROMYCIN 250 MG PO TABS: ORAL_TABLET | ORAL | 0 refills | Status: DC

## 2021-07-15 NOTE — Patient Instructions (Addendum)
° ° ° °  Medications changes include :  zpak    Your prescription(s) have been submitted to your pharmacy. Please take as directed and contact our office if you believe you are having problem(s) with the medication(s).    Please call if there is no improvement in your symptoms.

## 2021-07-15 NOTE — Assessment & Plan Note (Signed)
Acute With possible cyst or infected cyst in right posterior throat Start Z-Pak Symptomatic treatment with over-the-counter cold medications, pain relievers-Tylenol Call if no improvement

## 2021-07-15 NOTE — Assessment & Plan Note (Signed)
Acute, mild Slight tenderness with palpation of right parotid and she feels it has been this May go along with all of her other symptoms suggestive of throat infection We will start Z-Pak She will let me know if her symptoms do not improve after the antibiotic Continue increased fluids and Tylenol as needed

## 2021-07-15 NOTE — Assessment & Plan Note (Signed)
Acute on chronic He is having increased nausea and decreased appetite Likely related to current infection in throat Continue Zofran as needed She will let me know if this does not improve in which case I would recommend GI referral

## 2021-07-16 ENCOUNTER — Other Ambulatory Visit: Payer: Self-pay | Admitting: Internal Medicine

## 2021-07-31 ENCOUNTER — Encounter: Payer: Self-pay | Admitting: Internal Medicine

## 2021-08-20 ENCOUNTER — Other Ambulatory Visit: Payer: Self-pay | Admitting: Internal Medicine

## 2021-08-20 ENCOUNTER — Encounter: Payer: Self-pay | Admitting: Internal Medicine

## 2021-08-20 DIAGNOSIS — F419 Anxiety disorder, unspecified: Secondary | ICD-10-CM

## 2021-08-20 MED ORDER — ONDANSETRON HCL 4 MG PO TABS: 4.0000 mg | ORAL_TABLET | Freq: Three times a day (TID) | ORAL | 0 refills | Status: DC | PRN

## 2021-08-20 MED ORDER — ALPRAZOLAM 0.5 MG PO TABS: ORAL_TABLET | ORAL | 3 refills | Status: DC

## 2021-09-16 ENCOUNTER — Encounter: Payer: Self-pay | Admitting: Internal Medicine

## 2021-09-21 ENCOUNTER — Other Ambulatory Visit: Payer: Self-pay | Admitting: Internal Medicine

## 2021-09-22 ENCOUNTER — Encounter: Payer: Self-pay | Admitting: Internal Medicine

## 2021-09-22 ENCOUNTER — Ambulatory Visit: Payer: Medicare Other | Admitting: Internal Medicine

## 2021-09-22 NOTE — Progress Notes (Signed)
? ? ? ? ?Subjective:  ? ? Patient ID: Laura Barnes, female    DOB: [birth year], 81 y.o.   MRN: [id number] ? ?This visit occurred during the SARS-CoV-2 public health emergency.  Safety protocols were in place, including screening questions prior to the visit, additional usage of staff PPE, and extensive cleaning of exam room while observing appropriate contact time as indicated for disinfecting solutions.   ? ? ?HPI ?Laura Barnes is here for follow up of her chronic medical problems, including hypothyroidism, leg swelling, anxiety, depression, fibromyalgia, HRT, chronic nausea ? ?She is going to [facility] and they did xrays yesterday.   ? ?Place under right ribs hurt at times -intermittently.    ? ?Medications and allergies reviewed with patient and updated if appropriate. ? ?Current Outpatient Medications on File Prior to Visit  ?Medication Sig Dispense Refill  ? acetaminophen (TYLENOL) 500 MG tablet Take 2 tablets (1,000 mg total) by mouth every 6 (six) hours as needed for mild pain, moderate pain, fever or headache. 30 tablet 0  ? ALPRAZolam (XANAX) 0.5 MG tablet TAKE 1 TABLET BY MOUTH 3 TIMES A DAY AS NEEDED FOR ANXIETY 90 tablet 3  ? Amino Acids (AMINO ACID PO) Take 1 capsule by mouth 2 (two) times daily.    ? Ascorbic Acid (VITAMIN C) 1000 MG tablet Take 1,000 mg by mouth 2 (two) times daily.    ? B Complex-Biotin-FA (B COMPLETE) TABS Take 1 tablet by mouth daily. B Complete '100mg'$     ? buPROPion (WELLBUTRIN XL) 300 MG 24 hr tablet TAKE 1 TABLET EVERY DAY 90 tablet 1  ? Cholecalciferol (VITAMIN D3) 1000 units CAPS Take 1,000 Units by mouth daily.    ? Coenzyme Q10 (CO Q-10) 200 MG CAPS Take 1 capsule by mouth 2 (two) times daily.    ? ECHINACEA PO Take 1 tablet by mouth 2 (two) times daily.    ? ELDERBERRY PO Take by mouth daily.    ? estradiol (ESTRACE) 0.5 MG tablet TAKE 1 TABLET BY MOUTH EVERY DAY 90 tablet 1  ? fluticasone (FLONASE) 50 MCG/ACT nasal spray Place 2 sprays into both nostrils as needed.      ? Green Tea, Camellia sinensis, POWD Take 1 capsule by mouth daily.    ? Homeopathic Products (ARNICA [place]) PLLT Take by mouth daily.    ? HYDROmorphone (DILAUDID) 2 MG tablet 2 mg every 4 (four) hours as needed for severe pain.    ? hydrOXYzine (VISTARIL) 25 MG capsule TAKE 1 CAPSULE (25 MG TOTAL) BY MOUTH EVERY 8 (EIGHT) HOURS AS NEEDED FOR ITCHING. 30 capsule 0  ? ketorolac (ACULAR) 0.5 % ophthalmic solution Place 1 drop into the right eye 3 (three) times daily.    ? KRILL OIL PO Take by mouth daily.    ? LECITHIN PO Take 1 tablet by mouth daily.    ? levothyroxine (SYNTHROID) 75 MCG tablet TAKE 1 TABLET BY MOUTH EVERY MORNING 90 tablet 1  ? MAGNESIUM ASPARTATE PO Take 1 tablet by mouth 2 (two) times daily.    ? meclizine (ANTIVERT) 25 MG tablet Take 1 tablet (25 mg total) by mouth 3 (three) times daily as needed for dizziness. 60 tablet 5  ? Misc. Devices (ROLLATOR) MISC Use rollator for ambulation 1 each 0  ? Multiple Vitamins-Minerals (ZINC PO) Take by mouth daily.    ? ofloxacin (OCUFLOX) 0.3 % ophthalmic solution Place 1 drop into the right eye 4 (four) times daily.    ? ondansetron (  ZOFRAN) 4 MG tablet Take 1 tablet (4 mg total) by mouth every 8 (eight) hours as needed for nausea. 90 tablet 0  ? ondansetron (ZOFRAN-ODT) 8 MG disintegrating tablet DISSOLVE 1 TABLET ON THE TONGUE EVERY 8 HOURS AS NEEDED FOR NAUSEA OR VOMITTING 90 tablet 0  ? POTASSIUM AMINOBENZOATE PO Take 1 tablet by mouth daily.    ? triamterene-hydrochlorothiazide (MAXZIDE-25) 37.5-25 MG tablet TAKE 1 TABLET BY MOUTH EVERY DAY 90 tablet 1  ? TURMERIC PO Take 1 tablet by mouth 2 (two) times daily.     ? vitamin E 400 UNIT capsule Take 400 Units by mouth daily.    ? ?No current facility-administered medications on file prior to visit.  ? ? ? ?Review of Systems  ?Constitutional:  Negative for fever.  ?Respiratory:  Positive for cough (episodes) and choking (ccasionally). Negative for shortness of breath and wheezing.   ?Cardiovascular:   Positive for leg swelling. Negative for chest pain and palpitations.  ?Musculoskeletal:  Positive for back pain and myalgias.  ?Neurological:  Positive for headaches. Negative for light-headedness.  ? ?   ?Objective:  ? ?Vitals:  ? 09/23/21 1522  ?BP: 140/84  ?Pulse: 61  ?Temp: 98.3 ?F (36.8 ?C)  ?SpO2: 98%  ? ?BP Readings from Last 3 Encounters:  ?09/23/21 140/84  ?07/15/21 (!) 142/72  ?07/04/21 (!) 148/82  ? ?Wt Readings from Last 3 Encounters:  ?09/23/21 120 lb (54.4 kg)  ?07/15/21 120 lb (54.4 kg)  ?07/04/21 120 lb (54.4 kg)  ? ?Body mass index is 23.63 kg/m?. ? ?  ?Physical Exam ?Constitutional:   ?   General: She is not in acute distress. ?   Appearance: Normal appearance.  ?HENT:  ?   Head: Normocephalic and atraumatic.  ?Eyes:  ?   Conjunctiva/sclera: Conjunctivae normal.  ?Cardiovascular:  ?   Rate and Rhythm: Normal rate and regular rhythm.  ?   Heart sounds: Normal heart sounds. No murmur heard. ?Pulmonary:  ?   Effort: Pulmonary effort is normal. No respiratory distress.  ?   Breath sounds: Normal breath sounds. No wheezing.  ?Musculoskeletal:  ?   Cervical back: Neck supple.  ?   Right lower leg: No edema.  ?   Left lower leg: No edema.  ?Lymphadenopathy:  ?   Cervical: No cervical adenopathy.  ?Skin: ?   General: Skin is warm and dry.  ?   Findings: No rash.  ?Neurological:  ?   Mental Status: She is alert. Mental status is at baseline.  ?Psychiatric:     ?   Mood and Affect: Mood normal.     ?   Behavior: Behavior normal.  ? ?   ? ?Lab Results  ?Component Value Date  ? WBC 9.4 03/31/2021  ? HGB 12.9 03/31/2021  ? HCT 38.7 03/31/2021  ? PLT 278.0 03/31/2021  ? GLUCOSE 104 (H) 03/31/2021  ? CHOL 271 (H) 03/29/2020  ? TRIG 87.0 03/29/2020  ? HDL 103.80 03/29/2020  ? LDLDIRECT 119.6 04/06/2013  ? LDLCALC 149 (H) 03/29/2020  ? ALT 17 03/31/2021  ? AST 26 03/31/2021  ? NA 137 03/31/2021  ? K 3.9 03/31/2021  ? CL 99 03/31/2021  ? CREATININE 0.80 03/31/2021  ? BUN 19 03/31/2021  ? CO2 29 03/31/2021  ? TSH  1.38 03/31/2021  ? HGBA1C 5.3 01/11/2021  ? ? ? ?Assessment & Plan:  ? ? ?See Problem List for Assessment and Plan of chronic medical problems.  ? ? ?

## 2021-09-22 NOTE — Patient Instructions (Addendum)
? ?  ? ? ?  Medications changes include :   none ? ? ? ? ?Return in about 6 months (around 03/25/2022) for follow up. ? ?

## 2021-09-23 ENCOUNTER — Ambulatory Visit (INDEPENDENT_AMBULATORY_CARE_PROVIDER_SITE_OTHER): Payer: Medicare Other | Admitting: Internal Medicine

## 2021-09-23 VITALS — BP 140/84 | HR 61 | Temp 98.3°F | Ht 59.75 in | Wt 120.0 lb

## 2021-09-23 DIAGNOSIS — R600 Localized edema: Secondary | ICD-10-CM | POA: Diagnosis not present

## 2021-09-23 DIAGNOSIS — F3289 Other specified depressive episodes: Secondary | ICD-10-CM

## 2021-09-23 DIAGNOSIS — F419 Anxiety disorder, unspecified: Secondary | ICD-10-CM | POA: Diagnosis not present

## 2021-09-23 DIAGNOSIS — M797 Fibromyalgia: Secondary | ICD-10-CM

## 2021-09-23 DIAGNOSIS — Z79890 Hormone replacement therapy (postmenopausal): Secondary | ICD-10-CM

## 2021-09-23 DIAGNOSIS — R110 Nausea: Secondary | ICD-10-CM

## 2021-09-23 DIAGNOSIS — E039 Hypothyroidism, unspecified: Secondary | ICD-10-CM

## 2021-09-23 NOTE — Assessment & Plan Note (Signed)
Chronic  ?Clinically euthyroid ?Currently taking levothyroxine 75 mcg ? ?

## 2021-09-23 NOTE — Assessment & Plan Note (Signed)
Chronic ?Continue estradiol 0.5 mg daily ?

## 2021-09-23 NOTE — Assessment & Plan Note (Signed)
Chronic ?Intermittent nausea ?Continue zofran prn ?

## 2021-09-23 NOTE — Assessment & Plan Note (Signed)
Chronic ?Controlled, stable ?Continue welbutrin xl 300 mg daily, xanax 0.5 mg tid prn ?

## 2021-09-23 NOTE — Assessment & Plan Note (Signed)
Chronic ?Controlled, stable ?Continue wellbutrin xl 300 mg daily ? ?

## 2021-09-23 NOTE — Assessment & Plan Note (Signed)
Chronic ?Controlled, stable ?Continue triamterene-hct 37.5-25 mg daily ?

## 2021-09-23 NOTE — Assessment & Plan Note (Signed)
Chronic ?Controlled, stable ?Continue wellbutrin xl 300 mg daily, estradiol 0.5 mg daily ?Tylenol as needed ?Continue natural supplements ?

## 2021-09-28 ENCOUNTER — Other Ambulatory Visit: Payer: Self-pay | Admitting: Internal Medicine

## 2021-09-29 ENCOUNTER — Other Ambulatory Visit: Payer: Self-pay | Admitting: Internal Medicine

## 2021-10-19 NOTE — Progress Notes (Signed)
Subjective:    Patient ID: Laura Barnes, female    DOB: [birth year], 81 y.o.   MRN: [id number]      HPI Laura Barnes is here for  Chief Complaint  Patient presents with   Abdominal Pain   Breast Mass    Patient states she has knot on left breast (wants to have a scan)     Stomach problems - 3-4 days after she takes a pain pill she gets abdominal pain, she is constipated ( still has pellets), bloated.  She takes dulcolax fiber chews.  She eats prunes and other fruits.  She takes a pain pill about 2/ month.  Senokot - causes cramps.   Left breast - 2-3 week ago she felt a lump in the left breast.  She has implants placed in the 1980's.  Only tissue left is under the nipple.  The rest of her breast tissues was removed due to fibrocystic disease that was causing a lot of pain.  She was advised nt to have a mammogram done by the plastic surgeon.  Her old implants ruptured and they were replaced in 2016.      Medications and allergies reviewed with patient and updated if appropriate.  Current Outpatient Medications on File Prior to Visit  Medication Sig Dispense Refill   acetaminophen (TYLENOL) 500 MG tablet Take 2 tablets (1,000 mg total) by mouth every 6 (six) hours as needed for mild pain, moderate pain, fever or headache. 30 tablet 0   ALPRAZolam (XANAX) 0.5 MG tablet TAKE 1 TABLET BY MOUTH 3 TIMES A DAY AS NEEDED FOR ANXIETY 90 tablet 3   Amino Acids (AMINO ACID PO) Take 1 capsule by mouth 2 (two) times daily.     Ascorbic Acid (VITAMIN C) 1000 MG tablet Take 1,000 mg by mouth 2 (two) times daily.     B Complex-Biotin-FA (B COMPLETE) TABS Take 1 tablet by mouth daily. B Complete '100mg'$      buPROPion (WELLBUTRIN XL) 300 MG 24 hr tablet TAKE 1 TABLET EVERY DAY 90 tablet 1   Cholecalciferol (VITAMIN D3) 1000 units CAPS Take 1,000 Units by mouth daily.     Coenzyme Q10 (CO Q-10) 200 MG CAPS Take 1 capsule by mouth 2 (two) times daily.     ECHINACEA PO Take 1 tablet by mouth 2 (two)  times daily.     ELDERBERRY PO Take by mouth daily.     estradiol (ESTRACE) 0.5 MG tablet TAKE 1 TABLET BY MOUTH EVERY DAY 90 tablet 1   fluticasone (FLONASE) 50 MCG/ACT nasal spray Place 2 sprays into both nostrils as needed.      Green Tea, Camellia sinensis, POWD Take 1 capsule by mouth daily.     Homeopathic Products (ARNICA [place]) PLLT Take by mouth daily.     HYDROmorphone (DILAUDID) 2 MG tablet 2 mg every 4 (four) hours as needed for severe pain.     hydrOXYzine (VISTARIL) 25 MG capsule TAKE 1 CAPSULE (25 MG TOTAL) BY MOUTH EVERY 8 (EIGHT) HOURS AS NEEDED FOR ITCHING. 30 capsule 0   ketorolac (ACULAR) 0.5 % ophthalmic solution Place 1 drop into the right eye 3 (three) times daily.     KRILL OIL PO Take by mouth daily.     LECITHIN PO Take 1 tablet by mouth daily.     levothyroxine (SYNTHROID) 75 MCG tablet TAKE 1 TABLET BY MOUTH EVERY DAY IN THE MORNING 90 tablet 1   MAGNESIUM ASPARTATE PO Take 1 tablet by mouth 2 (  two) times daily.     meclizine (ANTIVERT) 25 MG tablet Take 1 tablet (25 mg total) by mouth 3 (three) times daily as needed for dizziness. 60 tablet 5   Misc. Devices (ROLLATOR) MISC Use rollator for ambulation 1 each 0   Multiple Vitamins-Minerals (ZINC PO) Take by mouth daily.     ofloxacin (OCUFLOX) 0.3 % ophthalmic solution Place 1 drop into the right eye 4 (four) times daily.     ondansetron (ZOFRAN) 4 MG tablet Take 1 tablet (4 mg total) by mouth every 8 (eight) hours as needed for nausea. 90 tablet 0   ondansetron (ZOFRAN-ODT) 8 MG disintegrating tablet DISSOLVE 1 TABLET ON THE TONGUE EVERY 8 HOURS AS NEEDED FOR NAUSEA OR VOMITTING 90 tablet 0   POTASSIUM AMINOBENZOATE PO Take 1 tablet by mouth daily.     triamterene-hydrochlorothiazide (MAXZIDE-25) 37.5-25 MG tablet TAKE 1 TABLET BY MOUTH EVERY DAY 90 tablet 1   TURMERIC PO Take 1 tablet by mouth 2 (two) times daily.      vitamin E 400 UNIT capsule Take 400 Units by mouth daily.     No current  facility-administered medications on file prior to visit.    Review of Systems  Constitutional:  Negative for fever.  Gastrointestinal:  Positive for abdominal distention, abdominal pain, constipation and nausea. Negative for blood in stool (no black stool) and diarrhea.      Objective:   Vitals:   10/20/21 1426  BP: (!) 144/72  Pulse: 88  Temp: 98.2 F (36.8 C)  SpO2: 95%   BP Readings from Last 3 Encounters:  10/20/21 (!) 144/72  09/23/21 140/84  07/15/21 (!) 142/72   Wt Readings from Last 3 Encounters:  10/20/21 124 lb (56.2 kg)  09/23/21 120 lb (54.4 kg)  07/15/21 120 lb (54.4 kg)   Body mass index is 24.42 kg/m.    Physical Exam Constitutional:      General: She is not in acute distress.    Appearance: She is well-developed.  HENT:     Head: Normocephalic and atraumatic.  Chest:  Breasts:    Left: Tenderness (1 o'clock with palpable mass/firmness) present. No nipple discharge or skin change.  Abdominal:     Tenderness: There is generalized abdominal tenderness. There is no guarding or rebound.  Skin:    General: Skin is warm and dry.  Neurological:     Mental Status: She is alert.       DG Abd 1 View CLINICAL DATA:  Abdominal pain.  Evaluate for constipation.  EXAM: ABDOMEN - 1 VIEW  COMPARISON:  01/15/2021  FINDINGS: No dilated loops of large or small bowel identified. There is a moderate stool burden noted throughout the colon. Retained pill fragments are identified within the left hemiabdomen and right side of pelvis. Scoliosis deformity with lumbar degenerative disc disease, unchanged.  IMPRESSION: 1. Nonobstructive bowel gas pattern. 2. Moderate stool burden noted within the colon.  Electronically Signed   By: Kerby Moors M.D.   On: 10/20/2021 15:26      Assessment & Plan:    See Problem List for Assessment and Plan of chronic medical problems.

## 2021-10-20 ENCOUNTER — Encounter: Payer: Self-pay | Admitting: Internal Medicine

## 2021-10-20 ENCOUNTER — Ambulatory Visit (INDEPENDENT_AMBULATORY_CARE_PROVIDER_SITE_OTHER): Payer: Medicare Other

## 2021-10-20 ENCOUNTER — Ambulatory Visit (INDEPENDENT_AMBULATORY_CARE_PROVIDER_SITE_OTHER): Payer: Medicare Other | Admitting: Internal Medicine

## 2021-10-20 VITALS — BP 144/72 | HR 88 | Temp 98.2°F | Ht 59.75 in | Wt 124.0 lb

## 2021-10-20 DIAGNOSIS — R1084 Generalized abdominal pain: Secondary | ICD-10-CM

## 2021-10-20 DIAGNOSIS — K5903 Drug induced constipation: Secondary | ICD-10-CM | POA: Diagnosis not present

## 2021-10-20 DIAGNOSIS — N644 Mastodynia: Secondary | ICD-10-CM | POA: Insufficient documentation

## 2021-10-20 NOTE — Assessment & Plan Note (Signed)
Acute Likely secondary to constipation secondary to opioid pain medication-she only takes this twice a month, but her symptoms occur few days after taking pain medication-generalized abdominal pain, constipation and bloating Advised increasing bowel regimen to see if that helps Deferred prescription medication at this time KUB today to rule out obstruction and evaluate for constipation If no improvement may need to obtain a CT scan

## 2021-10-20 NOTE — Assessment & Plan Note (Signed)
Acute 2 days after taking her opioid pain medication she experiences generalized abdominal pain, constipation, bloating Likely constipation secondary to pain medication Currently taking 1 fiber to twice daily Discussed she needs to increase her bowel regimen if taking the pain medication Deferred prescription medication Will check x-ray to rule out obstruction and to evaluate for constipation If no improvement may need to consider prescription medication/further evaluation

## 2021-10-20 NOTE — Patient Instructions (Addendum)
    Have an xray downstairs.     Medications changes include :   none   A Ct chest was ordered.     Someone will call you to schedule an appointment if covered by your insurance.

## 2021-10-20 NOTE — Assessment & Plan Note (Signed)
Acute Experiencing left breast pain and palpable mass/firmness approximately 1 o'clock position History of implants, removed after rupture and replaced in 2016 Concern for possible rupture Was advised that she cannot have a mammogram We will see if we can do a CT scan-if not approved by insurance may need to consider ultrasound or having her see plastic surgery

## 2021-10-28 ENCOUNTER — Encounter: Payer: Self-pay | Admitting: Internal Medicine

## 2021-10-30 ENCOUNTER — Other Ambulatory Visit: Payer: Self-pay

## 2021-10-30 MED ORDER — HYDROXYZINE PAMOATE 25 MG PO CAPS: 25.0000 mg | ORAL_CAPSULE | Freq: Three times a day (TID) | ORAL | 3 refills | Status: DC | PRN

## 2021-11-02 ENCOUNTER — Other Ambulatory Visit: Payer: Self-pay | Admitting: Internal Medicine

## 2021-11-04 ENCOUNTER — Ambulatory Visit (INDEPENDENT_AMBULATORY_CARE_PROVIDER_SITE_OTHER)
Admission: RE | Admit: 2021-11-04 | Discharge: 2021-11-04 | Disposition: A | Discharge status: Home / Self Care | Payer: Medicare Other | Source: Ambulatory Visit | Attending: Internal Medicine | Admitting: Internal Medicine

## 2021-11-04 DIAGNOSIS — N644 Mastodynia: Secondary | ICD-10-CM | POA: Diagnosis not present

## 2021-11-05 ENCOUNTER — Encounter: Payer: Self-pay | Admitting: Internal Medicine

## 2021-12-17 ENCOUNTER — Other Ambulatory Visit: Payer: Self-pay | Admitting: Internal Medicine

## 2021-12-19 ENCOUNTER — Encounter: Payer: Self-pay | Admitting: Internal Medicine

## 2021-12-22 ENCOUNTER — Ambulatory Visit (INDEPENDENT_AMBULATORY_CARE_PROVIDER_SITE_OTHER): Payer: Medicare Other

## 2021-12-22 DIAGNOSIS — Z0000 Encounter for general adult medical examination without abnormal findings: Secondary | ICD-10-CM | POA: Diagnosis not present

## 2021-12-22 NOTE — Progress Notes (Signed)
I connected with Laura Barnes today by telephone and verified that I am speaking with the correct person using two identifiers. Location patient: home Location provider: work Persons participating in the virtual visit: patient, provider.   I discussed the limitations, risks, security and privacy concerns of performing an evaluation and management service by telephone and the availability of in person appointments. I also discussed with the patient that there may be a patient responsible charge related to this service. The patient expressed understanding and verbally consented to this telephonic visit.    Interactive audio and video telecommunications were attempted between this provider and patient, however failed, due to patient having technical difficulties OR patient did not have access to video capability.  We continued and completed visit with audio only.  Some vital signs may be absent or patient reported.   Time Spent with patient on telephone encounter: 30 minutes  Subjective:   Laura Barnes is a 81 y.o. female who presents for Medicare Annual (Subsequent) preventive examination.  Review of Systems     Cardiac Risk Factors include: advanced age (>1mn, >>42women);family history of premature cardiovascular disease;hypertension     Objective:    There were no vitals filed for this visit. There is no height or weight on file to calculate BMI.     12/22/2021    2:54 PM 01/11/2021    3:30 AM 09/27/2019    2:22 PM 09/15/2019   12:53 PM 09/22/2018    3:46 PM 09/21/2017    3:26 PM 04/23/2017    6:44 AM  Advanced Directives  Does Patient Have a Medical Advance Directive? Yes No Yes No Yes Yes Yes  Type of Advance Directive Living will;Healthcare Power of [place]  [place] will  Does patient want to make changes to medical  advance directive? No - Patient declined  No - Patient declined  Yes (ED - Information included in AVS)    Copy of [place] Chart? No - copy requested  No - copy requested  No - copy requested No - copy requested   Would patient like information on creating a medical advance directive?  No - Patient declined         Current Medications (verified) Outpatient Encounter Medications as of 12/22/2021  Medication Sig   acetaminophen (TYLENOL) 500 MG tablet Take 2 tablets (1,000 mg total) by mouth every 6 (six) hours as needed for mild pain, moderate pain, fever or headache.   ALPRAZolam (XANAX) 0.5 MG tablet TAKE 1 TABLET BY MOUTH 3 TIMES A DAY AS NEEDED FOR ANXIETY   Amino Acids (AMINO ACID PO) Take 1 capsule by mouth 2 (two) times daily.   Ascorbic Acid (VITAMIN C) 1000 MG tablet Take 1,000 mg by mouth 2 (two) times daily.   B Complex-Biotin-FA (B COMPLETE) TABS Take 1 tablet by mouth daily. B Complete '100mg'$    buPROPion (WELLBUTRIN XL) 300 MG 24 hr tablet TAKE 1 TABLET BY MOUTH EVERY DAY   Cholecalciferol (VITAMIN D3) 1000 units CAPS Take 1,000 Units by mouth daily.   Coenzyme Q10 (CO Q-10) 200 MG CAPS Take 1 capsule by mouth 2 (two) times daily.   ECHINACEA PO Take 1 tablet by mouth 2 (two) times daily.   ELDERBERRY PO Take by mouth daily.   estradiol (ESTRACE) 0.5 MG tablet TAKE 1 TABLET BY MOUTH EVERY DAY   fluticasone (FLONASE) 50  MCG/ACT nasal spray Place 2 sprays into both nostrils as needed.    Green Tea, Camellia sinensis, POWD Take 1 capsule by mouth daily.   Homeopathic Products (ARNICA [place]) PLLT Take by mouth daily.   HYDROmorphone (DILAUDID) 2 MG tablet 2 mg every 4 (four) hours as needed for severe pain.   hydrOXYzine (VISTARIL) 25 MG capsule Take 1 capsule (25 mg total) by mouth every 8 (eight) hours as needed for itching.   ketorolac (ACULAR) 0.5 % ophthalmic solution Place 1 drop into the right eye 3 (three) times daily.   KRILL OIL PO Take by mouth  daily.   LECITHIN PO Take 1 tablet by mouth daily.   levothyroxine (SYNTHROID) 75 MCG tablet TAKE 1 TABLET BY MOUTH EVERY DAY IN THE MORNING   MAGNESIUM ASPARTATE PO Take 1 tablet by mouth 2 (two) times daily.   meclizine (ANTIVERT) 25 MG tablet Take 1 tablet (25 mg total) by mouth 3 (three) times daily as needed for dizziness.   Misc. Devices (ROLLATOR) MISC Use rollator for ambulation   Multiple Vitamins-Minerals (ZINC PO) Take by mouth daily.   ofloxacin (OCUFLOX) 0.3 % ophthalmic solution Place 1 drop into the right eye 4 (four) times daily.   ondansetron (ZOFRAN) 4 MG tablet Take 1 tablet (4 mg total) by mouth every 8 (eight) hours as needed for nausea.   ondansetron (ZOFRAN-ODT) 8 MG disintegrating tablet DISSOLVE 1 TABLET ON THE TONGUE EVERY 8 HOURS AS NEEDED FOR NAUSEA OR VOMITTING   POTASSIUM AMINOBENZOATE PO Take 1 tablet by mouth daily.   triamterene-hydrochlorothiazide (MAXZIDE-25) 37.5-25 MG tablet TAKE 1 TABLET BY MOUTH EVERY DAY   TURMERIC PO Take 1 tablet by mouth 2 (two) times daily.    vitamin E 400 UNIT capsule Take 400 Units by mouth daily.   No facility-administered encounter medications on file as of 12/22/2021.    Allergies (verified) Prednisone, Amoxicillin-pot clavulanate, Aspirin, Erythromycin, Levofloxacin, Nsaids, Statins, Sumatriptan, Adhesive [tape], Ivp dye [iodinated contrast media], Lyrica [pregabalin], Methylphenidate hcl, Mirtazapine, Oxycodone, Red yeast rice [cholestin], Shellfish allergy, Soy allergy, Doxycycline, Hydrocodone, Hydrocodone-acetaminophen, Keflex [cephalexin], Latex, Rofecoxib, Sertraline hcl, and Sulfonamide derivatives   History: Past Medical History:  Diagnosis Date   ALLERGIC RHINITIS    Allergy    Anemia    ANXIETY    Arthritis    hands   Bronchitis    Chronic fatigue fibromyalgia syndrome    Complication of anesthesia    says one time waking up she couldn't breathe, felt like her throat closing up   DEPRESSION    Enthesopathy  of hip region    Family history of anesthesia complication    sister with n/v   FOOT PAIN, BILATERAL    GERD    Hiatal hernia    HYPERLIPIDEMIA    Hypothyroidism    IBS    MIGRAINE HEADACHE    MIGRAINE, COMMON    NECK MASS    OSTEOPENIA    OSTEOPOROSIS    PLANTAR FASCIITIS    RASH-NONVESICULAR    SINUSITIS- ACUTE-NOS    SKIN LESION    Urticaria    VAGINITIS    VENOUS INSUFFICIENCY, CHRONIC    Past Surgical History:  Procedure Laterality Date   APPENDECTOMY     BREAST ENHANCEMENT SURGERY     BREAST IMPLANT REMOVAL     CHOLECYSTECTOMY     COLONOSCOPY     FOOT SURGERY  11/06/2016   LAPAROSCOPY N/A 01/14/2021   Procedure: LAPAROSCOPY DIAGNOSTIC, LYSIS OF ADHESIONS, laparoscopic assisted open small  bowel resection.;  Surgeon: Michael Boston, MD;  Location: [facility];  Service: General;  Laterality: N/A;   MASTECTOMY     bilateral for severe bilateral fibrocystic disease   OOPHORECTOMY     s/p neck lump removal     SHOULDER SURGERY     SINUS ENDO W/FUSION Right 06/30/2013   Procedure: RIGHT ENDOSCOPIC SPHENOIDECTOMY WITH FUSION SCAN;  Surgeon: [name], MD;  Location: [facility] OR;  Service: ENT;  Laterality: Right;   TONSILLECTOMY     VAGINAL HYSTERECTOMY     Family History  Problem Relation Age of Onset   Diabetes Mother    Heart disease Father    Diabetes Father    Diabetes Sister    Heart disease Maternal Aunt    Heart disease Maternal Uncle        x 2   Kidney disease Paternal Uncle        questionable   Asthma Maternal Grandmother    Breast cancer Maternal Grandmother    Heart disease Son    Healthy Son    Irritable bowel syndrome Son    Colon cancer Neg Hx    Colon polyps Neg Hx    Rectal cancer Neg Hx    Stomach cancer Neg Hx    Social History   Socioeconomic History   Marital status: Widowed    Spouse name: Not on file   Number of children: 2   Years of education: Not on file   Highest education level: Not on file  Occupational History    Occupation: retired Press photographer  Tobacco Use   Smoking status: Never   Smokeless tobacco: Never  Vaping Use   Vaping Use: Never used  Substance and Sexual Activity   Alcohol use: No   Drug use: No   Sexual activity: Not Currently  Other Topics Concern   Not on file  Social History Narrative   Has 2 biological children and 1 step child   Social Determinants of Health   Financial Resource Strain: Low Risk  (12/22/2021)   Overall Financial Resource Strain (CARDIA)    Difficulty of Paying Living Expenses: Not hard at all  Food Insecurity: No Food Insecurity (12/22/2021)   Hunger Vital Sign    Worried About Running Out of Food in the Last Year: Never true    [place] in the Last Year: Never true  Transportation Needs: No Transportation Needs (12/22/2021)   PRAPARE - Hydrologist (Medical): No    Lack of Transportation (Non-Medical): No  Physical Activity: Sufficiently Active (12/22/2021)   Exercise Vital Sign    Days of Exercise per Week: 5 days    Minutes of Exercise per Session: 30 min  Stress: No Stress Concern Present (12/22/2021)   [place]    Feeling of Stress : Not at all  Social Connections: Moderately Integrated (12/22/2021)   Social Connection and Isolation Panel [NHANES]    Frequency of Communication with Friends and Family: More than three times a week    Frequency of Social Gatherings with Friends and Family: More than three times a week    Attends Religious Services: More than 4 times per year    Active Member of [employer] or Organizations: Yes    Attends Archivist Meetings: More than 4 times per year    Marital Status: Widowed    Tobacco Counseling Counseling given: Not Answered   Clinical Intake:  Pre-visit  preparation completed: Yes  Pain : No/denies pain     BMI - recorded: 24.42 Nutritional Status: BMI of 19-24  Normal Nutritional Risks:  None Diabetes: No  How often do you need to have someone help you when you read instructions, pamphlets, or other written materials from your doctor or pharmacy?: 1 - Never What is the last grade level you completed in school?: HSG; College courses at [employer]  Diabetic? no  Interpreter Needed?: No  Information entered by :: Lisette Abu, LPN.   Activities of Daily Living    12/22/2021    2:47 PM 01/11/2021   10:30 AM  In your present state of health, do you have any difficulty performing the following activities:  Hearing? 0 0  Vision? 0 0  Difficulty concentrating or making decisions? 0 0  Walking or climbing stairs? 0 0  Dressing or bathing? 0 1  Doing errands, shopping? 0 0  Preparing Food and eating ? N   Using the Toilet? N   In the past six months, have you accidently leaked urine? N   Do you have problems with loss of bowel control? N   Managing your Medications? N   Managing your Finances? N   Housekeeping or managing your Housekeeping? N     Patient Care Team: Binnie Rail, MD as PCP - General (Internal Medicine) Bo Merino, MD as Consulting Physician (Rheumatology) Dorothy Spark, MD as Consulting Physician (Cardiology) [name], MD as Consulting Physician (Otolaryngology) Lyndal Pulley, DO as Consulting Physician (Family Medicine) Brand Males, MD as Consulting Physician (Pulmonary Disease) Magnus Sinning, MD as Consulting Physician (Physical Medicine and Rehabilitation) Ashok Pall, MD as Consulting Physician (Neurosurgery) Charlton Haws, [facility] as Pharmacist (Pharmacist) Jalene Mullet, MD as Consulting Physician (Ophthalmology) Luberta Mutter, MD as Consulting Physician (Ophthalmology)  Indicate any recent Medical Services you may have received from other than [facility] providers in the past year (date may be approximate).     Assessment:   This is a routine wellness examination for Emylie.  Hearing/Vision  screen Hearing Screening - Comments:: Patient denied any hearing difficulty.   No hearing aids.  Vision Screening - Comments:: Patient does wear corrective lenses/contacts.  Eye exam done by: Mellody Life, MD every 6-8 months   Dietary issues and exercise activities discussed: Current Exercise Habits: Home exercise routine, Type of exercise: walking;Other - see comments (walking 1 miles inside the home, counting steps, using stretch band), Time (Minutes): 30, Frequency (Times/Week): 5, Weekly Exercise (Minutes/Week): 150, Intensity: Mild, Exercise limited by: neurologic condition(s)   Goals Addressed             This Visit's Progress    Stay on my holistic health plan and maintain my health.        Depression Screen    12/22/2021    2:43 PM 07/18/2021    2:10 PM 03/31/2021    2:28 PM 12/14/2019    2:51 PM 09/27/2019    2:24 PM 01/03/2019    4:00 PM 09/22/2018    3:46 PM  PHQ 2/9 Scores  PHQ - 2 Score 0 4 0 6 0 3 0  PHQ- 9 Score  '7 4 21  9 4    '$ Fall Risk    12/22/2021    2:35 PM 07/18/2021    2:10 PM 09/27/2020    1:35 PM 09/27/2019    2:23 PM 09/18/2019    2:18 PM  Fall Risk   Falls in the past year? 0 1 0  1 1  Number falls in past yr: 0 1 0 1 1  Injury with Fall? 0 0 0 1 1  Risk for fall due to : No Fall Risks No Fall Risks No Fall Risks Impaired balance/gait;Impaired mobility;History of fall(s)   Follow up Falls evaluation completed Falls evaluation completed Falls evaluation completed Falls evaluation completed;Education provided;Falls prevention discussed Falls evaluation completed    FALL RISK PREVENTION PERTAINING TO THE HOME:  Any stairs in or around the home? Yes  If so, are there any without handrails? No  Home free of loose throw rugs in walkways, pet beds, electrical cords, etc? Yes  Adequate lighting in your home to reduce risk of falls? Yes   ASSISTIVE DEVICES UTILIZED TO PREVENT FALLS:  Life alert? No  Use of a cane, walker or w/c? Yes  Grab bars in  the bathroom? Yes  Shower chair or bench in shower? Yes  Elevated toilet seat or a handicapped toilet? Yes   TIMED UP AND GO:  Was the test performed? No .  Length of time to ambulate 10 feet: n/a sec.   Appearance of gait: Gait not evaluated during this visit.  Cognitive Function:    09/21/2017    4:08 PM  MMSE - Mini Mental State Exam  Orientation to time 5  Orientation to Place 5  Registration 3  Attention/ Calculation 5  Recall 1  Language- name 2 objects 2  Language- repeat 1  Language- follow 3 step command 3  Language- read & follow direction 1  Write a sentence 1  Copy design 1  Total score 28        12/22/2021    2:47 PM  6CIT Screen  What Year? 0 points  What month? 0 points  What time? 0 points  Count back from 20 0 points  Months in reverse 0 points  Repeat phrase 0 points  Total Score 0 points    Immunizations Immunization History  Administered Date(s) Administered   Fluad Quad(high Dose 65+) 01/18/2019, 03/29/2020, 03/31/2021   Influenza Split 04/07/2011, 03/08/2012   Influenza Whole 03/20/2008, 04/01/2009, 04/02/2010   Influenza, High Dose Seasonal PF 04/12/2013, 03/26/2015, 03/04/2016, 04/01/2017   Influenza,inj,Quad PF,6+ Mos 05/01/2014   Influenza-Unspecified 04/01/2018   PNEUMOCOCCAL CONJUGATE-20 03/31/2021   Pneumococcal Conjugate-13 05/02/2013   Pneumococcal Polysaccharide-23 01/25/2006   Td 09/17/2008   Tdap 03/24/2019   Zoster Recombinat (Shingrix) 08/18/2017, 12/06/2017   Zoster, Live 01/25/2006    TDAP status: Up to date  Flu Vaccine status: Up to date  Pneumococcal vaccine status: Up to date  Covid-19 vaccine status: Declined, Education has been provided regarding the importance of this vaccine but patient still declined. Advised may receive this vaccine at local pharmacy or Health Dept.or vaccine clinic. Aware to provide a copy of the vaccination record if obtained from local pharmacy or Health Dept. Verbalized acceptance and  understanding.  Qualifies for Shingles Vaccine? Yes   Zostavax completed Yes   Shingrix Completed?: Yes  Screening Tests Health Maintenance  Topic Date Due   INFLUENZA VACCINE  12/30/2021   DEXA SCAN  04/10/2022   TETANUS/TDAP  03/23/2029   Pneumonia Vaccine 7+ Years old  Completed   Zoster Vaccines- Shingrix  Completed   HPV VACCINES  Aged Out   COVID-19 Vaccine  Discontinued    Health Maintenance  There are no preventive care reminders to display for this patient.  Colorectal cancer screening: No longer required.   Mammogram status: No longer required due to patient refusal.  Bone Density status: Completed 04/10/2020. Results reflect: Bone density results: OSTEOPENIA. Repeat every 2-3 years.  Lung Cancer Screening: (Low Dose CT Chest recommended if Age 30-80 years, 30 pack-year currently smoking OR have quit w/in 15years.) does not qualify.   Lung Cancer Screening Referral: no  Additional Screening:  Hepatitis C Screening: does not qualify; Completed no  Vision Screening: Recommended annual ophthalmology exams for early detection of glaucoma and other disorders of the eye. Is the patient up to date with their annual eye exam?  Yes  Who is the provider or what is the name of the office in which the patient attends annual eye exams? Mellody Life, MD. If pt is not established with a provider, would they like to be referred to a provider to establish care? No .   Dental Screening: Recommended annual dental exams for proper oral hygiene  Community Resource Referral / Chronic Care Management: CRR required this visit?  No   CCM required this visit?  No      Plan:     I have personally reviewed and noted the following in the patient's chart:   Medical and social history Use of alcohol, tobacco or illicit drugs  Current medications and supplements including opioid prescriptions.  Functional ability and status Nutritional status Physical activity Advanced  directives List of other physicians Hospitalizations, surgeries, and ER visits in previous 12 months Vitals Screenings to include cognitive, depression, and falls Referrals and appointments  In addition, I have reviewed and discussed with patient certain preventive protocols, quality metrics, and best practice recommendations. A written personalized care plan for preventive services as well as general preventive health recommendations were provided to patient.     Sheral Flow, LPN   01/01/2121   Nurse Notes:  There were no vitals filed for this visit. There is no height or weight on file to calculate BMI. Patient stated that she has no issues with gait or balance; does not use any assistive devices. Medications reviewed with patient; no opioid use noted.

## 2021-12-22 NOTE — Patient Instructions (Signed)
[name] , Thank you for taking time to come for your Medicare Wellness Visit. I appreciate your ongoing commitment to your health goals. Please review the following plan we discussed and let me know if I can assist you in the future.   Screening recommendations/referrals: Colonoscopy: Discontinued Mammogram: Discontinued Bone Density: 04/10/2020; due every 2-3 years Recommended yearly ophthalmology/optometry visit for glaucoma screening and checkup Recommended yearly dental visit for hygiene and checkup  Vaccinations: Influenza vaccine: 03/31/2021 Pneumococcal vaccine: 01/25/2006, 05/02/2013, 03/31/2021 Tdap vaccine: 03/24/2019; due every 10 years Shingles vaccine: 08/27/2017, 12/06/2017   Covid-19: declined  Advanced directives: Yes  Conditions/risks identified: Yes  Next appointment: Please schedule your next Medicare Wellness Visit with your Nurse Health Advisor in 1 year by calling [phone].   Preventive Care 23 Years and Older, Female Preventive care refers to lifestyle choices and visits with your health care provider that can promote health and wellness. What does preventive care include? A yearly physical exam. This is also called an annual well check. Dental exams once or twice a year. Routine eye exams. Ask your health care provider how often you should have your eyes checked. Personal lifestyle choices, including: Daily care of your teeth and gums. Regular physical activity. Eating a healthy diet. Avoiding tobacco and drug use. Limiting alcohol use. Practicing safe sex. Taking low-dose aspirin every day. Taking vitamin and mineral supplements as recommended by your health care provider. What happens during an annual well check? The services and screenings done by your health care provider during your annual well check will depend on your age, overall health, lifestyle risk factors, and family history of disease. Counseling  Your health care provider may ask  you questions about your: Alcohol use. Tobacco use. Drug use. Emotional well-being. Home and relationship well-being. Sexual activity. Eating habits. History of falls. Memory and ability to understand (cognition). Work and work Statistician. Reproductive health. Screening  You may have the following tests or measurements: Height, weight, and BMI. Blood pressure. Lipid and cholesterol levels. These may be checked every 5 years, or more frequently if you are over 4 years old. Skin check. Lung cancer screening. You may have this screening every year starting at age 24 if you have a 30-pack-year history of smoking and currently smoke or have quit within the past 15 years. Fecal occult blood test (FOBT) of the stool. You may have this test every year starting at age 31. Flexible sigmoidoscopy or colonoscopy. You may have a sigmoidoscopy every 5 years or a colonoscopy every 10 years starting at age 74. Hepatitis C blood test. Hepatitis B blood test. Sexually transmitted disease (STD) testing. Diabetes screening. This is done by checking your blood sugar (glucose) after you have not eaten for a while (fasting). You may have this done every 1-3 years. Bone density scan. This is done to screen for osteoporosis. You may have this done starting at age 45. Mammogram. This may be done every 1-2 years. Talk to your health care provider about how often you should have regular mammograms. Talk with your health care provider about your test results, treatment options, and if necessary, the need for more tests. Vaccines  Your health care provider may recommend certain vaccines, such as: Influenza vaccine. This is recommended every year. Tetanus, diphtheria, and acellular pertussis (Tdap, Td) vaccine. You may need a Td booster every 10 years. Zoster vaccine. You may need this after age 82. Pneumococcal 13-valent conjugate (PCV13) vaccine. One dose is recommended after age 17. Pneumococcal  polysaccharide (PPSV23)  vaccine. One dose is recommended after age 28. Talk to your health care provider about which screenings and vaccines you need and how often you need them. This information is not intended to replace advice given to you by your health care provider. Make sure you discuss any questions you have with your health care provider. Document Released: 06/14/2015 Document Revised: 02/05/2016 Document Reviewed: 03/19/2015 Elsevier Interactive Patient Education  2017 [employer] Prevention in the Home Falls can cause injuries. They can happen to people of all ages. There are many things you can do to make your home safe and to help prevent falls. What can I do on the outside of my home? Regularly fix the edges of walkways and driveways and fix any cracks. Remove anything that might make you trip as you walk through a door, such as a raised step or threshold. Trim any bushes or trees on the path to your home. Use bright outdoor lighting. Clear any walking paths of anything that might make someone trip, such as rocks or tools. Regularly check to see if handrails are loose or broken. Make sure that both sides of any steps have handrails. Any raised decks and porches should have guardrails on the edges. Have any leaves, snow, or ice cleared regularly. Use sand or salt on walking paths during winter. Clean up any spills in your garage right away. This includes oil or grease spills. What can I do in the bathroom? Use night lights. Install grab bars by the toilet and in the tub and shower. Do not use towel bars as grab bars. Use non-skid mats or decals in the tub or shower. If you need to sit down in the shower, use a plastic, non-slip stool. Keep the floor dry. Clean up any water that spills on the floor as soon as it happens. Remove soap buildup in the tub or shower regularly. Attach bath mats securely with double-sided non-slip rug tape. Do not have throw rugs and other  things on the floor that can make you trip. What can I do in the bedroom? Use night lights. Make sure that you have a light by your bed that is easy to reach. Do not use any sheets or blankets that are too big for your bed. They should not hang down onto the floor. Have a firm chair that has side arms. You can use this for support while you get dressed. Do not have throw rugs and other things on the floor that can make you trip. What can I do in the kitchen? Clean up any spills right away. Avoid walking on wet floors. Keep items that you use a lot in easy-to-reach places. If you need to reach something above you, use a strong step stool that has a grab bar. Keep electrical cords out of the way. Do not use floor polish or wax that makes floors slippery. If you must use wax, use non-skid floor wax. Do not have throw rugs and other things on the floor that can make you trip. What can I do with my stairs? Do not leave any items on the stairs. Make sure that there are handrails on both sides of the stairs and use them. Fix handrails that are broken or loose. Make sure that handrails are as long as the stairways. Check any carpeting to make sure that it is firmly attached to the stairs. Fix any carpet that is loose or worn. Avoid having throw rugs at the top or bottom of  the stairs. If you do have throw rugs, attach them to the floor with carpet tape. Make sure that you have a light switch at the top of the stairs and the bottom of the stairs. If you do not have them, ask someone to add them for you. What else can I do to help prevent falls? Wear shoes that: Do not have high heels. Have rubber bottoms. Are comfortable and fit you well. Are closed at the toe. Do not wear sandals. If you use a stepladder: Make sure that it is fully opened. Do not climb a closed stepladder. Make sure that both sides of the stepladder are locked into place. Ask someone to hold it for you, if possible. Clearly  mark and make sure that you can see: Any grab bars or handrails. First and last steps. Where the edge of each step is. Use tools that help you move around (mobility aids) if they are needed. These include: Canes. Walkers. Scooters. Crutches. Turn on the lights when you go into a dark area. Replace any light bulbs as soon as they burn out. Set up your furniture so you have a clear path. Avoid moving your furniture around. If any of your floors are uneven, fix them. If there are any pets around you, be aware of where they are. Review your medicines with your doctor. Some medicines can make you feel dizzy. This can increase your chance of falling. Ask your doctor what other things that you can do to help prevent falls. This information is not intended to replace advice given to you by your health care provider. Make sure you discuss any questions you have with your health care provider. Document Released: 03/14/2009 Document Revised: 10/24/2015 Document Reviewed: 06/22/2014 Elsevier Interactive Patient Education  2017 [employer].
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
## 2022-01-12 ENCOUNTER — Encounter: Payer: Self-pay | Admitting: Gastroenterology

## 2022-01-21 ENCOUNTER — Encounter: Payer: Self-pay | Admitting: Rheumatology

## 2022-01-21 ENCOUNTER — Ambulatory Visit: Payer: Medicare Other | Attending: Rheumatology | Admitting: Rheumatology

## 2022-01-21 VITALS — BP 127/79 | HR 97 | Resp 15 | Ht 59.0 in | Wt 122.2 lb

## 2022-01-21 DIAGNOSIS — Z8719 Personal history of other diseases of the digestive system: Secondary | ICD-10-CM

## 2022-01-21 DIAGNOSIS — M7061 Trochanteric bursitis, right hip: Secondary | ICD-10-CM | POA: Diagnosis not present

## 2022-01-21 DIAGNOSIS — M7542 Impingement syndrome of left shoulder: Secondary | ICD-10-CM

## 2022-01-21 DIAGNOSIS — G5702 Lesion of sciatic nerve, left lower limb: Secondary | ICD-10-CM

## 2022-01-21 DIAGNOSIS — M19041 Primary osteoarthritis, right hand: Secondary | ICD-10-CM | POA: Diagnosis not present

## 2022-01-21 DIAGNOSIS — M19042 Primary osteoarthritis, left hand: Secondary | ICD-10-CM

## 2022-01-21 DIAGNOSIS — Z79890 Hormone replacement therapy (postmenopausal): Secondary | ICD-10-CM

## 2022-01-21 DIAGNOSIS — F419 Anxiety disorder, unspecified: Secondary | ICD-10-CM

## 2022-01-21 DIAGNOSIS — Z8639 Personal history of other endocrine, nutritional and metabolic disease: Secondary | ICD-10-CM

## 2022-01-21 DIAGNOSIS — G894 Chronic pain syndrome: Secondary | ICD-10-CM

## 2022-01-21 DIAGNOSIS — M5030 Other cervical disc degeneration, unspecified cervical region: Secondary | ICD-10-CM

## 2022-01-21 DIAGNOSIS — I872 Venous insufficiency (chronic) (peripheral): Secondary | ICD-10-CM

## 2022-01-21 DIAGNOSIS — M5136 Other intervertebral disc degeneration, lumbar region: Secondary | ICD-10-CM

## 2022-01-21 DIAGNOSIS — M7062 Trochanteric bursitis, left hip: Secondary | ICD-10-CM

## 2022-01-21 DIAGNOSIS — E041 Nontoxic single thyroid nodule: Secondary | ICD-10-CM

## 2022-01-21 DIAGNOSIS — M797 Fibromyalgia: Secondary | ICD-10-CM

## 2022-01-21 DIAGNOSIS — E039 Hypothyroidism, unspecified: Secondary | ICD-10-CM

## 2022-01-21 DIAGNOSIS — F32A Depression, unspecified: Secondary | ICD-10-CM

## 2022-01-21 DIAGNOSIS — M8589 Other specified disorders of bone density and structure, multiple sites: Secondary | ICD-10-CM

## 2022-01-21 NOTE — Patient Instructions (Signed)
Hand Exercises Hand exercises can be helpful for almost anyone. These exercises can strengthen the hands, improve flexibility and movement, and increase blood flow to the hands. These results can make work and daily tasks easier. Hand exercises can be especially helpful for people who have joint pain from arthritis or have nerve damage from overuse (carpal tunnel syndrome). These exercises can also help people who have injured a hand. Exercises Most of these hand exercises are gentle stretching and motion exercises. It is usually safe to do them often throughout the day. Warming up your hands before exercise may help to reduce stiffness. You can do this with gentle massage or by placing your hands in warm water for 10-15 minutes. It is normal to feel some stretching, pulling, tightness, or mild discomfort as you begin new exercises. This will gradually improve. Stop an exercise right away if you feel sudden, severe pain or your pain gets worse. Ask your health care provider which exercises are best for you. Knuckle bend or "claw" fist  Stand or sit with your arm, hand, and all five fingers pointed straight up. Make sure to keep your wrist straight during the exercise. Gently bend your fingers down toward your palm until the tips of your fingers are touching the top of your palm. Keep your big knuckle straight and just bend the small knuckles in your fingers. Hold this position for __________ seconds. Straighten (extend) your fingers back to the starting position. Repeat this exercise 5-10 times with each hand. Full finger fist  Stand or sit with your arm, hand, and all five fingers pointed straight up. Make sure to keep your wrist straight during the exercise. Gently bend your fingers into your palm until the tips of your fingers are touching the middle of your palm. Hold this position for __________ seconds. Extend your fingers back to the starting position, stretching every joint fully. Repeat  this exercise 5-10 times with each hand. Straight fist Stand or sit with your arm, hand, and all five fingers pointed straight up. Make sure to keep your wrist straight during the exercise. Gently bend your fingers at the big knuckle, where your fingers meet your hand, and the middle knuckle. Keep the knuckle at the tips of your fingers straight and try to touch the bottom of your palm. Hold this position for __________ seconds. Extend your fingers back to the starting position, stretching every joint fully. Repeat this exercise 5-10 times with each hand. Tabletop  Stand or sit with your arm, hand, and all five fingers pointed straight up. Make sure to keep your wrist straight during the exercise. Gently bend your fingers at the big knuckle, where your fingers meet your hand, as far down as you can while keeping the small knuckles in your fingers straight. Think of forming a tabletop with your fingers. Hold this position for __________ seconds. Extend your fingers back to the starting position, stretching every joint fully. Repeat this exercise 5-10 times with each hand. Finger spread  Place your hand flat on a table with your palm facing down. Make sure your wrist stays straight as you do this exercise. Spread your fingers and thumb apart from each other as far as you can until you feel a gentle stretch. Hold this position for __________ seconds. Bring your fingers and thumb tight together again. Hold this position for __________ seconds. Repeat this exercise 5-10 times with each hand. Making circles  Stand or sit with your arm, hand, and all five fingers pointed   straight up. Make sure to keep your wrist straight during the exercise. Make a circle by touching the tip of your thumb to the tip of your index finger. Hold for __________ seconds. Then open your hand wide. Repeat this motion with your thumb and each finger on your hand. Repeat this exercise 5-10 times with each hand. Thumb  motion  Sit with your forearm resting on a table and your wrist straight. Your thumb should be facing up toward the ceiling. Keep your fingers relaxed as you move your thumb. Lift your thumb up as high as you can toward the ceiling. Hold for __________ seconds. Bend your thumb across your palm as far as you can, reaching the tip of your thumb for the small finger (pinkie) side of your palm. Hold for __________ seconds. Repeat this exercise 5-10 times with each hand. Grip strengthening  Hold a stress ball or other soft ball in the middle of your hand. Slowly increase the pressure, squeezing the ball as much as you can without causing pain. Think of bringing the tips of your fingers into the middle of your palm. All of your finger joints should bend when doing this exercise. Hold your squeeze for __________ seconds, then relax. Repeat this exercise 5-10 times with each hand. Contact a health care provider if: Your hand pain or discomfort gets much worse when you do an exercise. Your hand pain or discomfort does not improve within 2 hours after you exercise. If you have any of these problems, stop doing these exercises right away. Do not do them again unless your health care provider says that you can. Get help right away if: You develop sudden, severe hand pain or swelling. If this happens, stop doing these exercises right away. Do not do them again unless your health care provider says that you can. This information is not intended to replace advice given to you by your health care provider. Make sure you discuss any questions you have with your health care provider. Document Revised: 09/05/2020 Document Reviewed: 09/05/2020 Elsevier Patient Education  2023 [employer] Inc.  

## 2022-02-23 ENCOUNTER — Other Ambulatory Visit: Payer: Self-pay | Admitting: Internal Medicine

## 2022-02-23 DIAGNOSIS — F419 Anxiety disorder, unspecified: Secondary | ICD-10-CM

## 2022-03-01 ENCOUNTER — Encounter: Payer: Self-pay | Admitting: Internal Medicine

## 2022-03-10 NOTE — Progress Notes (Unsigned)
Subjective:    Patient ID: Laura Barnes, female    DOB: Sep 13, 1940, 81 y.o.   MRN: [id number]      HPI Laura Barnes is here for No chief complaint on file.    ? Protein in urine    Medications and allergies reviewed with patient and updated if appropriate.  Current Outpatient Medications on File Prior to Visit  Medication Sig Dispense Refill   acetaminophen (TYLENOL) 500 MG tablet Take 2 tablets (1,000 mg total) by mouth every 6 (six) hours as needed for mild pain, moderate pain, fever or headache. 30 tablet 0   ALPRAZolam (XANAX) 0.5 MG tablet TAKE 1 TABLET BY MOUTH THREE TIMES A DAY AS NEEDED FOR ANXIETY 90 tablet 2   Amino Acids (AMINO ACID PO) Take 1 capsule by mouth 2 (two) times daily.     Ascorbic Acid (VITAMIN C) 1000 MG tablet Take 1,000 mg by mouth 2 (two) times daily.     B Complex-Biotin-FA (B COMPLETE) TABS Take 1 tablet by mouth daily. B Complete '100mg'$      BLACK COHOSH PO Take by mouth as needed.     buPROPion (WELLBUTRIN XL) 300 MG 24 hr tablet TAKE 1 TABLET BY MOUTH EVERY DAY 90 tablet 1   Cholecalciferol (VITAMIN D3) 1000 units CAPS Take 1,000 Units by mouth daily.     Coenzyme Q10 (CO Q-10) 200 MG CAPS Take 1 capsule by mouth 2 (two) times daily.     ECHINACEA PO Take 1 tablet by mouth 2 (two) times daily.     ELDERBERRY PO Take by mouth daily.     estradiol (ESTRACE) 0.5 MG tablet TAKE 1 TABLET BY MOUTH EVERY DAY 90 tablet 1   fluticasone (FLONASE) 50 MCG/ACT nasal spray Place 2 sprays into both nostrils as needed.      Green Tea, Camellia sinensis, POWD Take 1 capsule by mouth daily.     Homeopathic Products (ARNICA [place]) PLLT Take by mouth daily.     HYDROmorphone (DILAUDID) 2 MG tablet 2 mg every 4 (four) hours as needed for severe pain.     hydrOXYzine (VISTARIL) 25 MG capsule Take 1 capsule (25 mg total) by mouth every 8 (eight) hours as needed for itching. 30 capsule 3   ketorolac (ACULAR) 0.5 % ophthalmic solution Place 1 drop into the right eye  3 (three) times daily. (Patient not taking: Reported on 01/21/2022)     KRILL OIL PO Take by mouth daily.     LECITHIN PO Take 1 tablet by mouth daily.     levothyroxine (SYNTHROID) 75 MCG tablet TAKE 1 TABLET BY MOUTH EVERY DAY IN THE MORNING 90 tablet 1   MAGNESIUM ASPARTATE PO Take 1 tablet by mouth 2 (two) times daily.     meclizine (ANTIVERT) 25 MG tablet Take 1 tablet (25 mg total) by mouth 3 (three) times daily as needed for dizziness. 60 tablet 5   Misc. Devices (ROLLATOR) MISC Use rollator for ambulation 1 each 0   Multiple Vitamins-Minerals (ZINC PO) Take by mouth daily.     ofloxacin (OCUFLOX) 0.3 % ophthalmic solution Place 1 drop into the right eye 4 (four) times daily. (Patient not taking: Reported on 01/21/2022)     ondansetron (ZOFRAN) 4 MG tablet Take 1 tablet (4 mg total) by mouth every 8 (eight) hours as needed for nausea. 90 tablet 0   ondansetron (ZOFRAN-ODT) 8 MG disintegrating tablet DISSOLVE 1 TABLET ON THE TONGUE EVERY 8 HOURS AS NEEDED FOR NAUSEA OR VOMITTING  90 tablet 0   POTASSIUM AMINOBENZOATE PO Take 1 tablet by mouth daily.     TART CHERRY PO Take by mouth daily.     triamterene-hydrochlorothiazide (MAXZIDE-25) 37.5-25 MG tablet TAKE 1 TABLET BY MOUTH EVERY DAY 90 tablet 1   TURMERIC PO Take 1 tablet by mouth 2 (two) times daily.      vitamin E 400 UNIT capsule Take 400 Units by mouth daily.     No current facility-administered medications on file prior to visit.    Review of Systems     Objective:  There were no vitals filed for this visit. BP Readings from Last 3 Encounters:  01/21/22 127/79  10/20/21 (!) 144/72  09/23/21 140/84   Wt Readings from Last 3 Encounters:  01/21/22 122 lb 3.2 oz (55.4 kg)  10/20/21 124 lb (56.2 kg)  09/23/21 120 lb (54.4 kg)   There is no height or weight on file to calculate BMI.    Physical Exam         Assessment & Plan:    See Problem List for Assessment and Plan of chronic medical problems.

## 2022-03-11 ENCOUNTER — Ambulatory Visit (INDEPENDENT_AMBULATORY_CARE_PROVIDER_SITE_OTHER): Payer: Medicare Other | Admitting: Internal Medicine

## 2022-03-11 ENCOUNTER — Encounter: Payer: Self-pay | Admitting: Internal Medicine

## 2022-03-11 VITALS — BP 118/80 | HR 98 | Temp 98.2°F | Ht 59.0 in | Wt 120.0 lb

## 2022-03-11 DIAGNOSIS — R519 Headache, unspecified: Secondary | ICD-10-CM

## 2022-03-11 DIAGNOSIS — M170 Bilateral primary osteoarthritis of knee: Secondary | ICD-10-CM | POA: Diagnosis not present

## 2022-03-11 DIAGNOSIS — Z23 Encounter for immunization: Secondary | ICD-10-CM | POA: Diagnosis not present

## 2022-03-11 DIAGNOSIS — K581 Irritable bowel syndrome with constipation: Secondary | ICD-10-CM | POA: Diagnosis not present

## 2022-03-11 DIAGNOSIS — R350 Frequency of micturition: Secondary | ICD-10-CM | POA: Diagnosis not present

## 2022-03-11 DIAGNOSIS — B3731 Acute candidiasis of vulva and vagina: Secondary | ICD-10-CM | POA: Insufficient documentation

## 2022-03-11 DIAGNOSIS — G8929 Other chronic pain: Secondary | ICD-10-CM

## 2022-03-11 MED ORDER — DICYCLOMINE HCL 10 MG PO CAPS: 10.0000 mg | ORAL_CAPSULE | Freq: Three times a day (TID) | ORAL | 0 refills | Status: DC

## 2022-03-11 NOTE — Assessment & Plan Note (Signed)
Chronic Has intermittent symptoms, but had a more recent flare of what sounds like IBS-alternating bowel movements, abdominal pain and cramping, nausea, vomiting-no obvious triggers Symptoms have now resolved with some of the natural things she has not done Deferred GI referral, which I think is reasonable since her symptoms have resolved We will give her a small supply of dicyclomine to use if the severe abdominal cramping, again-discussed possible side effects and only to use this if needed If she tends to have another episode or recurrent symptoms can refer to GI

## 2022-03-11 NOTE — Patient Instructions (Addendum)
     Flu immunization administered today.      Urine tests were ordered.     Medications changes include :   you can try lotrimin cream for a possible yeast infection.  Dicyclomine for intestinal cramping    Your prescription(s) have been sent to your pharmacy.

## 2022-03-11 NOTE — Assessment & Plan Note (Signed)
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

## 2022-03-11 NOTE — Assessment & Plan Note (Signed)
Acute Has symptoms of a possible external genitalia yeast infection and possible UTI Check urinalysis, urine culture We will treat with an antibiotic only if culture is positive She was concerned maybe her bladder had fallen again-had surgery to tack it up several years ago.  Discussed that she would need to see a gynecologist to evaluate that further-she deferred at this time and will monitor

## 2022-03-11 NOTE — Assessment & Plan Note (Signed)
Acute Has had some vaginal discharge, white substance around clitoris and, irritation inside the labia Using several natural things to treat and there has been improvement Deferred Diflucan for She will let me know if there is no improvement

## 2022-03-11 NOTE — Assessment & Plan Note (Signed)
Had injections in her left knee in the past and it did help Having increased right knee pain-we will make an appointment with sports medicine to discuss injections

## 2022-03-12 LAB — URINE CULTURE: Result:: NO GROWTH

## 2022-03-12 LAB — URINALYSIS, ROUTINE W REFLEX MICROSCOPIC
Bilirubin Urine: NEGATIVE
Hgb urine dipstick: NEGATIVE
Ketones, ur: NEGATIVE
Nitrite: NEGATIVE
RBC / HPF: NONE SEEN (ref 0–?)
Specific Gravity, Urine: 1.015 (ref 1.000–1.030)
Total Protein, Urine: NEGATIVE
Urine Glucose: NEGATIVE
Urobilinogen, UA: 0.2 (ref 0.0–1.0)
pH: 5.5 (ref 5.0–8.0)

## 2022-03-12 NOTE — Addendum Note (Signed)
Addended by: Marcina Millard on: 03/12/2022 04:50 PM   Modules accepted: Orders

## 2022-03-16 ENCOUNTER — Ambulatory Visit: Payer: Self-pay

## 2022-03-16 ENCOUNTER — Ambulatory Visit (INDEPENDENT_AMBULATORY_CARE_PROVIDER_SITE_OTHER): Payer: Medicare Other

## 2022-03-16 ENCOUNTER — Ambulatory Visit: Payer: Medicare Other | Admitting: Family Medicine

## 2022-03-16 VITALS — BP 158/80 | HR 104 | Ht 59.0 in | Wt 122.0 lb

## 2022-03-16 DIAGNOSIS — G8929 Other chronic pain: Secondary | ICD-10-CM | POA: Diagnosis not present

## 2022-03-16 DIAGNOSIS — M170 Bilateral primary osteoarthritis of knee: Secondary | ICD-10-CM | POA: Diagnosis not present

## 2022-03-16 DIAGNOSIS — M25561 Pain in right knee: Secondary | ICD-10-CM

## 2022-03-16 DIAGNOSIS — M25562 Pain in left knee: Secondary | ICD-10-CM | POA: Diagnosis not present

## 2022-03-16 NOTE — Progress Notes (Signed)
   I, Peterson Lombard, LAT, ATC acting as a scribe for Lynne Leader, MD.  Laura Barnes is a 81 y.o. female who presents to [place] at [facility] today for cont'd bilat knee pain. Pt is currently being treated by rheumatology for fibromyalgia and pain management. Pt was last seen by Dr. Georgina Snell on 06/24/20 and was given a L SIJ steroid injection and a referral was placed to [facility]. Today, pt reports w/ the cooler weather her pain has increased through bilat thighs to her knees. Pt notes a hx of of a "pinched nerve" in her L-spine and has had prior "nerve blocks." Pt completed the Gelsyn series in her L knee on 01/01/20.  She has had steroid injections in the past with both Depo-Medrol and triamcinolone without significant side effects.  She does have reported allergy to prednisone.  Dx imaging: 07/08/18 Bilat knee standing XR  Pertinent review of systems: No fevers or chills  Relevant historical information: Fibromyalgia and chronic back pain.   Exam:  BP (!) 158/80   Pulse (!) 104   Ht '4\' 11"'$  (1.499 m)   Wt 122 lb (55.3 kg)   SpO2 99%   BMI 24.64 kg/m  General: Well Developed, well nourished, and in no acute distress.   MSK: Knees bilaterally normal-appearing normal motion with crepitation.    Lab and Radiology Results  X-ray images bilateral knees obtained today personally and independently interpreted. Moderate DJD bilateral knees. Await formal radiology review   Assessment and Plan: 81 y.o. female with bilateral knee pain due to DJD.  This is an acute exacerbation of a chronic problem.  She did well 2 years ago with hyaluronic acid injection.  We will go ahead and work on authorization for that now and likely have her return for gel shot series in the near future.  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 She does have a listed allergy to prednisone but based on prior exposure to triamcinolone injections I do  believe that Zilretta will be safe.  Total encounter time 30 minutes including face-to-face time with the patient and, reviewing past medical record, and charting on the date of service.     PDMP not reviewed this encounter. Orders Placed This Encounter  Procedures   DG Knee Bilateral Standing AP    Standing Status:   Future    Number of Occurrences:   1    Standing Expiration Date:   03/17/2023    Order Specific Question:   Reason for Exam (SYMPTOM  OR DIAGNOSIS REQUIRED)    Answer:   eval knee DJD    Order Specific Question:   Preferred imaging location?    Answer:   [facility]   No orders of the defined types were placed in this encounter.    Discussed warning signs or symptoms. Please see discharge instructions. Patient expresses understanding.   The above documentation has been reviewed and is accurate and complete Lynne Leader, M.D.

## 2022-03-16 NOTE — Patient Instructions (Signed)
Thank you for coming in today.   Please get an Xray today before you leave   We will plan for injections.   Recheck when we get the shots authorized.   Let me know if you have a problem.

## 2022-03-18 NOTE — Progress Notes (Signed)
Mild arthritis of both knees is present.

## 2022-03-21 ENCOUNTER — Other Ambulatory Visit: Payer: Self-pay | Admitting: Internal Medicine

## 2022-03-23 ENCOUNTER — Other Ambulatory Visit: Payer: Self-pay | Admitting: Internal Medicine

## 2022-03-24 ENCOUNTER — Ambulatory Visit: Payer: Medicare Other | Admitting: Family Medicine

## 2022-03-24 ENCOUNTER — Ambulatory Visit: Payer: Self-pay

## 2022-03-24 VITALS — BP 155/80 | HR 102 | Ht 59.0 in | Wt 124.0 lb

## 2022-03-24 DIAGNOSIS — M170 Bilateral primary osteoarthritis of knee: Secondary | ICD-10-CM | POA: Diagnosis not present

## 2022-03-24 DIAGNOSIS — M25561 Pain in right knee: Secondary | ICD-10-CM

## 2022-03-24 DIAGNOSIS — M25562 Pain in left knee: Secondary | ICD-10-CM

## 2022-03-24 DIAGNOSIS — G8929 Other chronic pain: Secondary | ICD-10-CM

## 2022-03-24 MED ORDER — SODIUM HYALURONATE (VISCOSUP) 16.8 MG/2ML IX SOSY
33.6000 mg | PREFILLED_SYRINGE | Freq: Once | INTRA_ARTICULAR | Status: AC
  Administered 2022-03-24: 33.6 mg via INTRA_ARTICULAR

## 2022-03-24 NOTE — Progress Notes (Unsigned)
   I, Peterson Lombard, LAT, ATC acting as a scribe for Lynne Leader, MD.  Laura Barnes is a 81 y.o. female who presents to [place] at [facility] today for continued chronic bilateral knee pain.  Pt is currently being treated by rheumatology for fibromyalgia and pain management. Pt completed the Gelsyn series in her L knee on 01/01/20. Patient was last seen by Dr. Georgina Snell on 03/16/2022 and was advised we would work on authorizing hyaluronic acid and Zilretta injections.  Today, patient reports  Dx imaging: 03/16/22 Bilat knee standing XR 07/08/18 Bilat knee standing XR  Pertinent review of systems: ***  Relevant historical information: ***   Exam:  There were no vitals taken for this visit. General: Well Developed, well nourished, and in no acute distress.   MSK: ***    Lab and Radiology Results  Gelsyn bilateral knees 1/3 Procedure: Real-time Ultrasound Guided Injection of right knee superior lateral patellar space Device: Philips Affiniti 50G Images permanently stored and available for review in PACS Verbal informed consent obtained.  Discussed risks and benefits of procedure. Warned about infection, bleeding, damage to structures among others. Patient expresses understanding and agreement Time-out conducted.   Noted no overlying erythema, induration, or other signs of local infection.   Skin prepped in a sterile fashion.   Local anesthesia: Topical Ethyl chloride.   With sterile technique and under real time ultrasound guidance: Gelsyn 2 mL injected into knee joint. Fluid seen entering the joint capsule.   Completed without difficulty   Advised to call if fevers/chills, erythema, induration, drainage, or persistent bleeding.   Images permanently stored and available for review in the ultrasound unit.  Impression: Technically successful ultrasound guided injection.    Procedure: Real-time Ultrasound Guided Injection of left knee superior lateral patellar  space Device: Philips Affiniti 50G Images permanently stored and available for review in PACS Verbal informed consent obtained.  Discussed risks and benefits of procedure. Warned about infection, bleeding, hyperglycemia damage to structures among others. Patient expresses understanding and agreement Time-out conducted.   Noted no overlying erythema, induration, or other signs of local infection.   Skin prepped in a sterile fashion.   Local anesthesia: Topical Ethyl chloride.   With sterile technique and under real time ultrasound guidance: Gelsyn 2 mL injected into knee joint. Fluid seen entering the joint capsule.   Completed without difficulty   Advised to call if fevers/chills, erythema, induration, drainage, or persistent bleeding.   Images permanently stored and available for review in the ultrasound unit.  Impression: Technically successful ultrasound guided injection.  Lot number: [id number] for both injections       Assessment and Plan: 81 y.o. female with ***   PDMP not reviewed this encounter. No orders of the defined types were placed in this encounter.  No orders of the defined types were placed in this encounter.    Discussed warning signs or symptoms. Please see discharge instructions. Patient expresses understanding.   ***

## 2022-03-24 NOTE — Patient Instructions (Addendum)
Thank you for coming in today.   You received an injection today. Seek immediate medical attention if the joint becomes red, extremely painful, or is oozing fluid.   Schedule the 2nd Gelsyn injection for next week and the 3rd for the week after that.

## 2022-03-31 ENCOUNTER — Ambulatory Visit: Payer: Medicare Other | Admitting: Family Medicine

## 2022-03-31 ENCOUNTER — Ambulatory Visit: Payer: Self-pay

## 2022-03-31 DIAGNOSIS — M25561 Pain in right knee: Secondary | ICD-10-CM

## 2022-03-31 DIAGNOSIS — M25562 Pain in left knee: Secondary | ICD-10-CM | POA: Diagnosis not present

## 2022-03-31 DIAGNOSIS — M170 Bilateral primary osteoarthritis of knee: Secondary | ICD-10-CM

## 2022-03-31 DIAGNOSIS — G8929 Other chronic pain: Secondary | ICD-10-CM | POA: Diagnosis not present

## 2022-03-31 MED ORDER — SODIUM HYALURONATE (VISCOSUP) 16.8 MG/2ML IX SOSY
33.6000 mg | PREFILLED_SYRINGE | Freq: Once | INTRA_ARTICULAR | Status: AC
  Administered 2022-03-31: 33.6 mg via INTRA_ARTICULAR

## 2022-03-31 NOTE — Patient Instructions (Signed)
Thank you for coming in today.   Call or go to the ER if you develop a large red swollen joint with extreme pain or oozing puss.    Return in 1 week for injection #3

## 2022-03-31 NOTE — Progress Notes (Signed)
Laura "Jan" presents to clinic today for Gelsyn injection BL knees 2/3  Procedure: Real-time Ultrasound Guided Injection of right knee superior lateral patellar space  Device: Philips Affiniti 50G Images permanently stored and available for review in PACS Verbal informed consent obtained.  Discussed risks and benefits of procedure. Warned about infection, bleeding, hyperglycemia damage to structures among others. Patient expresses understanding and agreement Time-out conducted.   Noted no overlying erythema, induration, or other signs of local infection.   Skin prepped in a sterile fashion.   Local anesthesia: Topical Ethyl chloride.   With sterile technique and under real time ultrasound guidance:  Gelsyn 2 ml injected into knee joint. Fluid seen entering the joint capsule.   Completed without difficulty   Advised to call if fevers/chills, erythema, induration, drainage, or persistent bleeding.   Images permanently stored and available for review in the ultrasound unit.  Impression: Technically successful ultrasound guided injection.   Procedure: Real-time Ultrasound Guided Injection of left knee superior lateral patellar space  Device: Philips Affiniti 50G Images permanently stored and available for review in PACS Verbal informed consent obtained.  Discussed risks and benefits of procedure. Warned about infection, bleeding, hyperglycemia damage to structures among others. Patient expresses understanding and agreement Time-out conducted.   Noted no overlying erythema, induration, or other signs of local infection.   Skin prepped in a sterile fashion.   Local anesthesia: Topical Ethyl chloride.   With sterile technique and under real time ultrasound guidance:  Gelsyn 70m injected into knee joint. Fluid seen entering the joint capsule.   Completed without difficulty   Advised to call if fevers/chills, erythema, induration, drainage, or persistent bleeding.   Images permanently stored  and available for review in the ultrasound unit.  Impression: Technically successful ultrasound guided injection.  Lot number: [id number] injections  Return in 1 week for Gelsyn injection BL knees 3/3

## 2022-04-07 ENCOUNTER — Ambulatory Visit: Payer: Medicare Other | Admitting: Family Medicine

## 2022-04-07 ENCOUNTER — Ambulatory Visit: Payer: Self-pay

## 2022-04-07 DIAGNOSIS — M170 Bilateral primary osteoarthritis of knee: Secondary | ICD-10-CM

## 2022-04-07 DIAGNOSIS — G8929 Other chronic pain: Secondary | ICD-10-CM

## 2022-04-07 DIAGNOSIS — M25562 Pain in left knee: Secondary | ICD-10-CM | POA: Diagnosis not present

## 2022-04-07 DIAGNOSIS — M25561 Pain in right knee: Secondary | ICD-10-CM | POA: Diagnosis not present

## 2022-04-07 MED ORDER — SODIUM HYALURONATE (VISCOSUP) 16.8 MG/2ML IX SOSY
33.6000 mg | PREFILLED_SYRINGE | Freq: Once | INTRA_ARTICULAR | Status: AC
  Administered 2022-04-07: 33.6 mg via INTRA_ARTICULAR

## 2022-04-07 NOTE — Patient Instructions (Signed)
Thank you for coming in today.   You received an injection today. Seek immediate medical attention if the joint becomes red, extremely painful, or is oozing fluid.   That completes the [place] series in both of your knees.  Check back as needed

## 2022-04-07 NOTE — Progress Notes (Signed)
Jan presents to clinic today for Gelsyn injection bilateral knees 3/3  Procedure: Real-time Ultrasound Guided Injection of right knee superior lateral patellar space Device: Philips Affiniti 50G Images permanently stored and available for review in PACS Verbal informed consent obtained.  Discussed risks and benefits of procedure. Warned about infection, bleeding, damage to structures among others. Patient expresses understanding and agreement Time-out conducted.   Noted no overlying erythema, induration, or other signs of local infection.   Skin prepped in a sterile fashion.   Local anesthesia: Topical Ethyl chloride.   With sterile technique and under real time ultrasound guidance: Gelsyn 2 mL injected into knee joint. Fluid seen entering the joint capsule.   Completed without difficulty   Advised to call if fevers/chills, erythema, induration, drainage, or persistent bleeding.   Images permanently stored and available for review in the ultrasound unit.  Impression: Technically successful ultrasound guided injection.   Procedure: Real-time Ultrasound Guided Injection of left knee superior lateral patellar space Device: Philips Affiniti 50G Images permanently stored and available for review in PACS Verbal informed consent obtained.  Discussed risks and benefits of procedure. Warned about infection, bleeding, hyperglycemia damage to structures among others. Patient expresses understanding and agreement Time-out conducted.   Noted no overlying erythema, induration, or other signs of local infection.   Skin prepped in a sterile fashion.   Local anesthesia: Topical Ethyl chloride.   With sterile technique and under real time ultrasound guidance: Gelsyn 2 mL injected into knee joint. Fluid seen entering the joint capsule.   Completed without difficulty   Advised to call if fevers/chills, erythema, induration, drainage, or persistent bleeding.   Images permanently stored and available for  review in the ultrasound unit.  Impression: Technically successful ultrasound guided injection.  Lot number: [id number] bilateral injections

## 2022-04-14 ENCOUNTER — Encounter: Payer: Self-pay | Admitting: Internal Medicine

## 2022-04-14 NOTE — Progress Notes (Unsigned)
Subjective:    Patient ID: Laura Barnes, female    DOB: [birth year], 81 y.o.   MRN: [id number]      HPI Micky is here for No chief complaint on file.   She is here for an acute visit for cold symptoms.   Her symptoms started   She is experiencing   She has tried taking       Medications and allergies reviewed with patient and updated if appropriate.  Current Outpatient Medications on File Prior to Visit  Medication Sig Dispense Refill   acetaminophen (TYLENOL) 500 MG tablet Take 2 tablets (1,000 mg total) by mouth every 6 (six) hours as needed for mild pain, moderate pain, fever or headache. 30 tablet 0   ALPRAZolam (XANAX) 0.5 MG tablet TAKE 1 TABLET BY MOUTH THREE TIMES A DAY AS NEEDED FOR ANXIETY 90 tablet 2   Amino Acids (AMINO ACID PO) Take 1 capsule by mouth 2 (two) times daily.     Ascorbic Acid (VITAMIN C) 1000 MG tablet Take 1,000 mg by mouth 2 (two) times daily.     B Complex-Biotin-FA (B COMPLETE) TABS Take 1 tablet by mouth daily. B Complete '100mg'$      BLACK COHOSH PO Take by mouth as needed.     buPROPion (WELLBUTRIN XL) 300 MG 24 hr tablet TAKE 1 TABLET BY MOUTH EVERY DAY 90 tablet 1   Cholecalciferol (VITAMIN D3) 1000 units CAPS Take 1,000 Units by mouth daily.     Coenzyme Q10 (CO Q-10) 200 MG CAPS Take 1 capsule by mouth 2 (two) times daily.     dicyclomine (BENTYL) 10 MG capsule Take 1 capsule (10 mg total) by mouth 3 (three) times daily before meals. Take as needed for IBS flares 30 capsule 0   ECHINACEA PO Take 1 tablet by mouth 2 (two) times daily.     ELDERBERRY PO Take by mouth daily.     estradiol (ESTRACE) 0.5 MG tablet TAKE 1 TABLET BY MOUTH EVERY DAY 90 tablet 1   fluticasone (FLONASE) 50 MCG/ACT nasal spray Place 2 sprays into both nostrils as needed.      Green Tea, Camellia sinensis, POWD Take 1 capsule by mouth daily.     Homeopathic Products ([place]) PLLT Take by mouth daily.     HYDROmorphone (DILAUDID) 2 MG tablet 2 mg  every 4 (four) hours as needed for severe pain.     hydrOXYzine (VISTARIL) 25 MG capsule Take 1 capsule (25 mg total) by mouth every 8 (eight) hours as needed for itching. 30 capsule 3   ketorolac (ACULAR) 0.5 % ophthalmic solution Place 1 drop into the right eye 3 (three) times daily.     KRILL OIL PO Take by mouth daily.     LECITHIN PO Take 1 tablet by mouth daily.     levothyroxine (SYNTHROID) 75 MCG tablet TAKE 1 TABLET BY MOUTH EVERY DAY IN THE MORNING 90 tablet 1   MAGNESIUM ASPARTATE PO Take 1 tablet by mouth 2 (two) times daily.     meclizine (ANTIVERT) 25 MG tablet Take 1 tablet (25 mg total) by mouth 3 (three) times daily as needed for dizziness. 60 tablet 5   Misc. Devices (ROLLATOR) MISC Use rollator for ambulation 1 each 0   Multiple Vitamins-Minerals (ZINC PO) Take by mouth daily.     ofloxacin (OCUFLOX) 0.3 % ophthalmic solution Place 1 drop into the right eye 4 (four) times daily.     ondansetron (ZOFRAN) 4 MG tablet  Take 1 tablet (4 mg total) by mouth every 8 (eight) hours as needed for nausea. 90 tablet 0   ondansetron (ZOFRAN-ODT) 8 MG disintegrating tablet DISSOLVE 1 TABLET ON THE TONGUE EVERY 8 HOURS AS NEEDED FOR NAUSEA OR VOMITTING 90 tablet 0   POTASSIUM AMINOBENZOATE PO Take 1 tablet by mouth daily.     TART CHERRY PO Take by mouth daily.     triamterene-hydrochlorothiazide (MAXZIDE-25) 37.5-25 MG tablet TAKE 1 TABLET BY MOUTH EVERY DAY 90 tablet 1   TURMERIC PO Take 1 tablet by mouth 2 (two) times daily.      vitamin E 400 UNIT capsule Take 400 Units by mouth daily.     No current facility-administered medications on file prior to visit.    Review of Systems     Objective:  There were no vitals filed for this visit. BP Readings from Last 3 Encounters:  03/24/22 (!) 155/80  03/16/22 (!) 158/80  03/11/22 118/80   Wt Readings from Last 3 Encounters:  03/24/22 124 lb (56.2 kg)  03/16/22 122 lb (55.3 kg)  03/11/22 120 lb (54.4 kg)   There is no height or  weight on file to calculate BMI.    Physical Exam         Assessment & Plan:    See Problem List for Assessment and Plan of chronic medical problems.

## 2022-04-15 ENCOUNTER — Ambulatory Visit (INDEPENDENT_AMBULATORY_CARE_PROVIDER_SITE_OTHER): Payer: Medicare Other | Admitting: Internal Medicine

## 2022-04-15 VITALS — BP 144/78 | HR 88 | Temp 97.9°F | Ht 59.0 in | Wt 121.0 lb

## 2022-04-15 DIAGNOSIS — J329 Chronic sinusitis, unspecified: Secondary | ICD-10-CM

## 2022-04-15 DIAGNOSIS — R42 Dizziness and giddiness: Secondary | ICD-10-CM | POA: Diagnosis not present

## 2022-04-15 NOTE — Assessment & Plan Note (Signed)
Chronic Sinus symptoms are chronic in nature and wax and wane-not necessarily worse She is experiencing some very transient intermittent dizziness-not convinced that is related to an acute infection Symptomatic treatment only of sinuses

## 2022-04-15 NOTE — Patient Instructions (Addendum)
? ? ? ?  ? ? ?  Medications changes include :  none  ? ? ? ? ? ?Return if symptoms worsen or fail to improve. ? ?

## 2022-04-15 NOTE — Assessment & Plan Note (Addendum)
Acute Having very transient episodes of vertigo a few times a day It is so transient feeling of vertigo that she has not felt like meclizine was necessary Was concerned maybe it was related to a sinus infection Has chronic sinus systems that wax and wane-symptoms now not necessarily worse than usual Taking meclizine 25 mg 3 times daily as needed No obvious infection so we will hold off on any treatment unless symptoms worsen

## 2022-06-16 ENCOUNTER — Other Ambulatory Visit: Payer: Self-pay | Admitting: Internal Medicine

## 2022-06-16 DIAGNOSIS — F419 Anxiety disorder, unspecified: Secondary | ICD-10-CM

## 2022-06-18 ENCOUNTER — Encounter: Payer: Self-pay | Admitting: Internal Medicine

## 2022-06-18 ENCOUNTER — Other Ambulatory Visit: Payer: Self-pay | Admitting: Internal Medicine

## 2022-06-30 ENCOUNTER — Encounter: Payer: Self-pay | Admitting: Internal Medicine

## 2022-07-02 NOTE — Progress Notes (Signed)
Subjective:    Patient ID: Laura Barnes, female    DOB: 1941-02-15, 82 y.o.   MRN: [id number]      HPI Laura Barnes is here for  Chief Complaint  Patient presents with   Sinusitis    Has some spots on her back she wants looked at    She is here for an acute visit for cold symptoms.  She thnks she has a sinus infection.   Her symptoms started 2 weeks  She is experiencing swollen glands in neck, headaches, low-grade fever, nasal congestion, ear pain, postnasal drip, sinus pain and pressure, sneezing, sore throat and cough  She has tried taking natural supplements, allergy medication     Medications and allergies reviewed with patient and updated if appropriate.  Current Outpatient Medications on File Prior to Visit  Medication Sig Dispense Refill   acetaminophen (TYLENOL) 500 MG tablet Take 2 tablets (1,000 mg total) by mouth every 6 (six) hours as needed for mild pain, moderate pain, fever or headache. 30 tablet 0   ALPRAZolam (XANAX) 0.5 MG tablet TAKE 1 TABLET BY MOUTH THREE TIMES A DAY AS NEEDED FOR ANXIETY 90 tablet 2   Amino Acids (AMINO ACID PO) Take 1 capsule by mouth 2 (two) times daily.     Ascorbic Acid (VITAMIN C) 1000 MG tablet Take 1,000 mg by mouth 2 (two) times daily.     B Complex-Biotin-FA (B COMPLETE) TABS Take 1 tablet by mouth daily. B Complete '100mg'$      BLACK COHOSH PO Take by mouth as needed.     buPROPion (WELLBUTRIN XL) 300 MG 24 hr tablet TAKE 1 TABLET BY MOUTH EVERY DAY 90 tablet 1   Cholecalciferol (VITAMIN D3) 1000 units CAPS Take 1,000 Units by mouth daily.     Coenzyme Q10 (CO Q-10) 200 MG CAPS Take 1 capsule by mouth 2 (two) times daily.     dicyclomine (BENTYL) 10 MG capsule Take 1 capsule (10 mg total) by mouth 3 (three) times daily before meals. Take as needed for IBS flares 30 capsule 0   ECHINACEA PO Take 1 tablet by mouth 2 (two) times daily.     ELDERBERRY PO Take by mouth daily.     estradiol (ESTRACE) 0.5 MG tablet TAKE 1 TABLET  BY MOUTH EVERY DAY 90 tablet 1   fluticasone (FLONASE) 50 MCG/ACT nasal spray Place 2 sprays into both nostrils as needed.      fluticasone (VERAMYST) 27.5 MCG/SPRAY nasal spray Place 1 spray into the nose daily.     Green Tea, Camellia sinensis, POWD Take 1 capsule by mouth daily.     Homeopathic Products ([place]) PLLT Take by mouth daily.     HYDROmorphone (DILAUDID) 2 MG tablet 2 mg every 4 (four) hours as needed for severe pain.     hydrOXYzine (VISTARIL) 25 MG capsule Take 1 capsule (25 mg total) by mouth every 8 (eight) hours as needed for itching. 30 capsule 3   ketorolac (ACULAR) 0.5 % ophthalmic solution Place 1 drop into the right eye 3 (three) times daily.     KRILL OIL PO Take by mouth daily.     LECITHIN PO Take 1 tablet by mouth daily.     levothyroxine (SYNTHROID) 75 MCG tablet TAKE 1 TABLET BY MOUTH EVERY DAY IN THE MORNING 90 tablet 1   MAGNESIUM ASPARTATE PO Take 1 tablet by mouth 2 (two) times daily.     meclizine (ANTIVERT) 25 MG tablet Take 1 tablet (25  mg total) by mouth 3 (three) times daily as needed for dizziness. 60 tablet 5   Misc. Devices (ROLLATOR) MISC Use rollator for ambulation 1 each 0   Multiple Vitamins-Minerals (ZINC PO) Take by mouth daily.     ofloxacin (OCUFLOX) 0.3 % ophthalmic solution Place 1 drop into the right eye 4 (four) times daily.     ondansetron (ZOFRAN) 4 MG tablet Take 1 tablet (4 mg total) by mouth every 8 (eight) hours as needed for nausea. 90 tablet 0   ondansetron (ZOFRAN-ODT) 8 MG disintegrating tablet DISSOLVE 1 TABLET ON THE TONGUE EVERY 8 HOURS AS NEEDED FOR NAUSEA OR VOMITTING 90 tablet 0   POTASSIUM AMINOBENZOATE PO Take 1 tablet by mouth daily.     TART CHERRY PO Take by mouth daily.     triamterene-hydrochlorothiazide (MAXZIDE-25) 37.5-25 MG tablet TAKE 1 TABLET BY MOUTH EVERY DAY 90 tablet 1   TURMERIC PO Take 1 tablet by mouth 2 (two) times daily.      vitamin E 400 UNIT capsule Take 400 Units by mouth daily.     No  current facility-administered medications on file prior to visit.    Review of Systems  Constitutional:  Positive for fever (low grade).  HENT:  Positive for congestion (green mucus), ear pain (right ear), postnasal drip, sinus pressure, sinus pain, sneezing and sore throat.   Respiratory:  Positive for cough. Negative for shortness of breath and wheezing.   Neurological:  Positive for headaches.       Objective:   Vitals:   07/03/22 1434  BP: 126/74  Pulse: 95  Temp: 98 F (36.7 C)  SpO2: 98%   BP Readings from Last 3 Encounters:  07/03/22 126/74  04/15/22 (!) 144/78  03/24/22 (!) 155/80   Wt Readings from Last 3 Encounters:  07/03/22 124 lb (56.2 kg)  04/15/22 121 lb (54.9 kg)  03/24/22 124 lb (56.2 kg)   Body mass index is 25.04 kg/m.    Physical Exam Constitutional:      General: She is not in acute distress.    Appearance: Normal appearance. She is not ill-appearing.  HENT:     Head: Normocephalic and atraumatic.     Right Ear: Tympanic membrane, ear canal and external ear normal.     Left Ear: Tympanic membrane, ear canal and external ear normal.     Mouth/Throat:     Mouth: Mucous membranes are moist.     Pharynx: No oropharyngeal exudate or posterior oropharyngeal erythema.  Eyes:     Conjunctiva/sclera: Conjunctivae normal.  Cardiovascular:     Rate and Rhythm: Normal rate and regular rhythm.  Pulmonary:     Effort: Pulmonary effort is normal. No respiratory distress.     Breath sounds: Normal breath sounds. No wheezing or rales.  Musculoskeletal:     Cervical back: Neck supple. No tenderness.  Lymphadenopathy:     Cervical: No cervical adenopathy.  Skin:    General: Skin is warm and dry.  Neurological:     Mental Status: She is alert.            Assessment & Plan:    See Problem List for Assessment and Plan of chronic medical problems.

## 2022-07-03 ENCOUNTER — Ambulatory Visit (INDEPENDENT_AMBULATORY_CARE_PROVIDER_SITE_OTHER): Payer: Medicare Other | Admitting: Internal Medicine

## 2022-07-03 ENCOUNTER — Encounter: Payer: Self-pay | Admitting: Internal Medicine

## 2022-07-03 VITALS — BP 126/74 | HR 95 | Temp 98.0°F | Ht 59.0 in | Wt 124.0 lb

## 2022-07-03 DIAGNOSIS — J018 Other acute sinusitis: Secondary | ICD-10-CM

## 2022-07-03 MED ORDER — CEPHALEXIN 500 MG PO CAPS: 500.0000 mg | ORAL_CAPSULE | Freq: Three times a day (TID) | ORAL | 0 refills | Status: AC

## 2022-07-03 NOTE — Assessment & Plan Note (Signed)
Acute Likely bacterial  Start keflex 500 mg tid x 10 days otc cold medications Rest, fluid Call if no improvement

## 2022-07-03 NOTE — Patient Instructions (Addendum)
        Medications changes include :   keflex 500 mg three times a day for 10 days      Return if symptoms worsen or fail to improve, for schedule follow up in april.

## 2022-08-24 DIAGNOSIS — M5136 Other intervertebral disc degeneration, lumbar region: Secondary | ICD-10-CM | POA: Diagnosis not present

## 2022-08-24 DIAGNOSIS — M9903 Segmental and somatic dysfunction of lumbar region: Secondary | ICD-10-CM | POA: Diagnosis not present

## 2022-08-24 DIAGNOSIS — M9905 Segmental and somatic dysfunction of pelvic region: Secondary | ICD-10-CM | POA: Diagnosis not present

## 2022-08-24 DIAGNOSIS — M9904 Segmental and somatic dysfunction of sacral region: Secondary | ICD-10-CM | POA: Diagnosis not present

## 2022-08-26 ENCOUNTER — Encounter: Payer: Self-pay | Admitting: Internal Medicine

## 2022-08-31 ENCOUNTER — Ambulatory Visit: Payer: Medicare Other | Admitting: Internal Medicine

## 2022-08-31 DIAGNOSIS — M797 Fibromyalgia: Secondary | ICD-10-CM

## 2022-08-31 DIAGNOSIS — R600 Localized edema: Secondary | ICD-10-CM

## 2022-08-31 DIAGNOSIS — I2510 Atherosclerotic heart disease of native coronary artery without angina pectoris: Secondary | ICD-10-CM

## 2022-08-31 DIAGNOSIS — E039 Hypothyroidism, unspecified: Secondary | ICD-10-CM

## 2022-09-01 DIAGNOSIS — M5416 Radiculopathy, lumbar region: Secondary | ICD-10-CM | POA: Diagnosis not present

## 2022-09-07 ENCOUNTER — Other Ambulatory Visit: Payer: Self-pay | Admitting: Physical Medicine and Rehabilitation

## 2022-09-07 DIAGNOSIS — M5416 Radiculopathy, lumbar region: Secondary | ICD-10-CM

## 2022-09-08 ENCOUNTER — Other Ambulatory Visit: Payer: Medicare Other

## 2022-09-12 ENCOUNTER — Ambulatory Visit
Admission: RE | Admit: 2022-09-12 | Discharge: 2022-09-12 | Disposition: A | Discharge status: Home / Self Care | Payer: Medicare Other | Source: Ambulatory Visit | Attending: Physical Medicine and Rehabilitation | Admitting: Physical Medicine and Rehabilitation

## 2022-09-12 DIAGNOSIS — M48061 Spinal stenosis, lumbar region without neurogenic claudication: Secondary | ICD-10-CM | POA: Diagnosis not present

## 2022-09-12 DIAGNOSIS — M545 Low back pain, unspecified: Secondary | ICD-10-CM | POA: Diagnosis not present

## 2022-09-12 DIAGNOSIS — M5416 Radiculopathy, lumbar region: Secondary | ICD-10-CM

## 2022-09-12 DIAGNOSIS — M5136 Other intervertebral disc degeneration, lumbar region: Secondary | ICD-10-CM | POA: Diagnosis not present

## 2022-09-15 DIAGNOSIS — M5416 Radiculopathy, lumbar region: Secondary | ICD-10-CM | POA: Diagnosis not present

## 2022-09-16 DIAGNOSIS — M9904 Segmental and somatic dysfunction of sacral region: Secondary | ICD-10-CM | POA: Diagnosis not present

## 2022-09-16 DIAGNOSIS — M5136 Other intervertebral disc degeneration, lumbar region: Secondary | ICD-10-CM | POA: Diagnosis not present

## 2022-09-16 DIAGNOSIS — M9903 Segmental and somatic dysfunction of lumbar region: Secondary | ICD-10-CM | POA: Diagnosis not present

## 2022-09-16 DIAGNOSIS — M9905 Segmental and somatic dysfunction of pelvic region: Secondary | ICD-10-CM | POA: Diagnosis not present

## 2022-09-17 ENCOUNTER — Other Ambulatory Visit: Payer: Self-pay | Admitting: Internal Medicine

## 2022-09-22 ENCOUNTER — Other Ambulatory Visit: Payer: Self-pay | Admitting: Internal Medicine

## 2022-09-24 NOTE — Patient Instructions (Addendum)
      Blood work was ordered.   The lab is on the first floor.    Medications changes include :   none    A referral was ordered for GI and ENT.     Someone will call you to schedule an appointment.     Return in about 6 months (around 03/27/2023) for Physical Exam.

## 2022-09-24 NOTE — Progress Notes (Signed)
Subjective:    Patient ID: Laura Barnes, female    DOB: November 04, 1940, 82 y.o.   MRN: [id number]     HPI Laura Barnes is here for follow up of her chronic medical problems.  Having vertigo.  Had 3-4 episodes over a period of 18 hrs.  Has tinnitus b/l ears.  Has some wax in right ear.      Medications and allergies reviewed with patient and updated if appropriate.  Current Outpatient Medications on File Prior to Visit  Medication Sig Dispense Refill   acetaminophen (TYLENOL) 500 MG tablet Take 2 tablets (1,000 mg total) by mouth every 6 (six) hours as needed for mild pain, moderate pain, fever or headache. 30 tablet 0   ALPRAZolam (XANAX) 0.5 MG tablet TAKE 1 TABLET BY MOUTH THREE TIMES A DAY AS NEEDED FOR ANXIETY 90 tablet 2   Amino Acids (AMINO ACID PO) Take 1 capsule by mouth 2 (two) times daily.     Ascorbic Acid (VITAMIN C) 1000 MG tablet Take 1,000 mg by mouth 2 (two) times daily.     B Complex-Biotin-FA (B COMPLETE) TABS Take 1 tablet by mouth daily. B Complete 100mg      BLACK COHOSH PO Take by mouth as needed.     buPROPion (WELLBUTRIN XL) 300 MG 24 hr tablet TAKE 1 TABLET BY MOUTH EVERY DAY 90 tablet 1   Cholecalciferol (VITAMIN D3) 1000 units CAPS Take 1,000 Units by mouth daily.     Coenzyme Q10 (CO Q-10) 200 MG CAPS Take 1 capsule by mouth 2 (two) times daily.     dicyclomine (BENTYL) 10 MG capsule Take 1 capsule (10 mg total) by mouth 3 (three) times daily before meals. Take as needed for IBS flares 30 capsule 0   ECHINACEA PO Take 1 tablet by mouth 2 (two) times daily.     ELDERBERRY PO Take by mouth daily.     estradiol (ESTRACE) 0.5 MG tablet TAKE 1 TABLET BY MOUTH EVERY DAY 90 tablet 1   fluticasone (FLONASE) 50 MCG/ACT nasal spray Place 2 sprays into both nostrils as needed.      fluticasone (VERAMYST) 27.5 MCG/SPRAY nasal spray Place 1 spray into the nose daily.     Green Tea, Camellia sinensis, POWD Take 1 capsule by mouth daily.     Homeopathic Products  ([place]) PLLT Take by mouth daily.     HYDROmorphone (DILAUDID) 2 MG tablet 2 mg every 4 (four) hours as needed for severe pain.     hydrOXYzine (VISTARIL) 25 MG capsule Take 1 capsule (25 mg total) by mouth every 8 (eight) hours as needed for itching. 30 capsule 3   ketorolac (ACULAR) 0.5 % ophthalmic solution Place 1 drop into the right eye 3 (three) times daily.     KRILL OIL PO Take by mouth daily.     LECITHIN PO Take 1 tablet by mouth daily.     levothyroxine (SYNTHROID) 75 MCG tablet TAKE 1 TABLET BY MOUTH EVERY DAY IN THE MORNING 90 tablet 1   MAGNESIUM ASPARTATE PO Take 1 tablet by mouth 2 (two) times daily.     meclizine (ANTIVERT) 25 MG tablet Take 1 tablet (25 mg total) by mouth 3 (three) times daily as needed for dizziness. 60 tablet 5   Misc. Devices (ROLLATOR) MISC Use rollator for ambulation 1 each 0   Multiple Vitamins-Minerals (ZINC PO) Take by mouth daily.     ofloxacin (OCUFLOX) 0.3 % ophthalmic solution Place 1 drop into  the right eye 4 (four) times daily.     ondansetron (ZOFRAN) 4 MG tablet TAKE 1 TABLET BY MOUTH EVERY 8 HOURS AS NEEDED FOR NAUSEA 90 tablet 0   ondansetron (ZOFRAN-ODT) 8 MG disintegrating tablet DISSOLVE 1 TABLET ON THE TONGUE EVERY 8 HOURS AS NEEDED FOR NAUSEA OR VOMITTING 90 tablet 0   POTASSIUM AMINOBENZOATE PO Take 1 tablet by mouth daily.     TART CHERRY PO Take by mouth daily.     triamterene-hydrochlorothiazide (MAXZIDE-25) 37.5-25 MG tablet TAKE 1 TABLET BY MOUTH EVERY DAY 90 tablet 1   TURMERIC PO Take 1 tablet by mouth 2 (two) times daily.      vitamin E 400 UNIT capsule Take 400 Units by mouth daily.     No current facility-administered medications on file prior to visit.     Review of Systems  Constitutional:  Negative for fever.  HENT:  Positive for tinnitus. Negative for ear pain and hearing loss.   Respiratory:  Positive for cough. Negative for shortness of breath and wheezing.   Cardiovascular:  Negative for chest pain,  palpitations and leg swelling.  Neurological:  Positive for dizziness and headaches (occ).       Objective:   Vitals:   09/25/22 1420  BP: 128/80  Pulse: (!) 102  Temp: 98.2 F (36.8 C)  SpO2: 97%   BP Readings from Last 3 Encounters:  09/25/22 128/80  07/03/22 126/74  04/15/22 (!) 144/78   Wt Readings from Last 3 Encounters:  09/25/22 121 lb (54.9 kg)  07/03/22 124 lb (56.2 kg)  04/15/22 121 lb (54.9 kg)   Body mass index is 24.44 kg/m.    Physical Exam Constitutional:      General: She is not in acute distress.    Appearance: Normal appearance.  HENT:     Head: Normocephalic and atraumatic.     Right Ear: Ear canal and external ear normal. There is no impacted cerumen (mild amount of water in middle ear canal - TM not visualized).     Left Ear: Tympanic membrane, ear canal and external ear normal. There is no impacted cerumen.  Eyes:     Conjunctiva/sclera: Conjunctivae normal.  Cardiovascular:     Rate and Rhythm: Normal rate and regular rhythm.     Heart sounds: Normal heart sounds.  Pulmonary:     Effort: Pulmonary effort is normal. No respiratory distress.     Breath sounds: Normal breath sounds. No wheezing.  Musculoskeletal:     Cervical back: Neck supple.     Right lower leg: No edema.     Left lower leg: No edema.  Lymphadenopathy:     Cervical: No cervical adenopathy.  Skin:    General: Skin is warm and dry.     Findings: No rash.  Neurological:     Mental Status: She is alert. Mental status is at baseline.  Psychiatric:        Mood and Affect: Mood normal.        Behavior: Behavior normal.        Lab Results  Component Value Date   WBC 9.4 03/31/2021   HGB 12.9 03/31/2021   HCT 38.7 03/31/2021   PLT 278.0 03/31/2021   GLUCOSE 104 (H) 03/31/2021   CHOL 271 (H) 03/29/2020   TRIG 87.0 03/29/2020   HDL 103.80 03/29/2020   LDLDIRECT 119.6 04/06/2013   LDLCALC 149 (H) 03/29/2020   ALT 17 03/31/2021   AST 26 03/31/2021   NA 137  03/31/2021   K 3.9 03/31/2021   CL 99 03/31/2021   CREATININE 0.80 03/31/2021   BUN 19 03/31/2021   CO2 29 03/31/2021   TSH 1.38 03/31/2021   HGBA1C 5.3 01/11/2021     Assessment & Plan:    See Problem List for Assessment and Plan of chronic medical problems.

## 2022-09-25 ENCOUNTER — Ambulatory Visit (INDEPENDENT_AMBULATORY_CARE_PROVIDER_SITE_OTHER): Payer: Medicare Other | Admitting: Internal Medicine

## 2022-09-25 ENCOUNTER — Encounter: Payer: Self-pay | Admitting: Internal Medicine

## 2022-09-25 VITALS — BP 128/80 | HR 102 | Temp 98.2°F | Ht 59.0 in | Wt 121.0 lb

## 2022-09-25 DIAGNOSIS — F419 Anxiety disorder, unspecified: Secondary | ICD-10-CM

## 2022-09-25 DIAGNOSIS — I2510 Atherosclerotic heart disease of native coronary artery without angina pectoris: Secondary | ICD-10-CM

## 2022-09-25 DIAGNOSIS — E039 Hypothyroidism, unspecified: Secondary | ICD-10-CM | POA: Diagnosis not present

## 2022-09-25 DIAGNOSIS — F3289 Other specified depressive episodes: Secondary | ICD-10-CM | POA: Diagnosis not present

## 2022-09-25 DIAGNOSIS — K219 Gastro-esophageal reflux disease without esophagitis: Secondary | ICD-10-CM

## 2022-09-25 DIAGNOSIS — R42 Dizziness and giddiness: Secondary | ICD-10-CM

## 2022-09-25 DIAGNOSIS — M797 Fibromyalgia: Secondary | ICD-10-CM | POA: Diagnosis not present

## 2022-09-25 DIAGNOSIS — R600 Localized edema: Secondary | ICD-10-CM | POA: Diagnosis not present

## 2022-09-25 DIAGNOSIS — R1310 Dysphagia, unspecified: Secondary | ICD-10-CM

## 2022-09-25 DIAGNOSIS — Z79890 Hormone replacement therapy (postmenopausal): Secondary | ICD-10-CM

## 2022-09-25 DIAGNOSIS — H9313 Tinnitus, bilateral: Secondary | ICD-10-CM

## 2022-09-25 DIAGNOSIS — R110 Nausea: Secondary | ICD-10-CM | POA: Diagnosis not present

## 2022-09-25 LAB — CBC WITH DIFFERENTIAL/PLATELET
Basophils Absolute: 0 10*3/uL (ref 0.0–0.1)
Basophils Relative: 0.4 % (ref 0.0–3.0)
Eosinophils Absolute: 0.1 10*3/uL (ref 0.0–0.7)
Eosinophils Relative: 1.1 % (ref 0.0–5.0)
HCT: 39.2 % (ref 36.0–46.0)
Hemoglobin: 13.3 g/dL (ref 12.0–15.0)
Lymphocytes Relative: 23.6 % (ref 12.0–46.0)
Lymphs Abs: 2.5 10*3/uL (ref 0.7–4.0)
MCHC: 33.9 g/dL (ref 30.0–36.0)
MCV: 99.1 fl (ref 78.0–100.0)
Monocytes Absolute: 0.9 10*3/uL (ref 0.1–1.0)
Monocytes Relative: 8.9 % (ref 3.0–12.0)
Neutro Abs: 6.9 10*3/uL (ref 1.4–7.7)
Neutrophils Relative %: 66 % (ref 43.0–77.0)
Platelets: 287 10*3/uL (ref 150.0–400.0)
RBC: 3.95 Mil/uL (ref 3.87–5.11)
RDW: 13.9 % (ref 11.5–15.5)
WBC: 10.5 10*3/uL (ref 4.0–10.5)

## 2022-09-25 LAB — COMPREHENSIVE METABOLIC PANEL
ALT: 17 U/L (ref 0–35)
AST: 24 U/L (ref 0–37)
Albumin: 4.3 g/dL (ref 3.5–5.2)
Alkaline Phosphatase: 37 U/L — ABNORMAL LOW (ref 39–117)
BUN: 21 mg/dL (ref 6–23)
CO2: 27 mEq/L (ref 19–32)
Calcium: 10.2 mg/dL (ref 8.4–10.5)
Chloride: 100 mEq/L (ref 96–112)
Creatinine, Ser: 0.79 mg/dL (ref 0.40–1.20)
GFR: 70 mL/min (ref >60.00)
Glucose, Bld: 105 mg/dL — ABNORMAL HIGH (ref 70–99)
Potassium: 3.7 mEq/L (ref 3.5–5.1)
Sodium: 137 mEq/L (ref 135–145)
Total Bilirubin: 0.4 mg/dL (ref 0.2–1.2)
Total Protein: 7.8 g/dL (ref 6.0–8.3)

## 2022-09-25 LAB — LIPID PANEL
Cholesterol: 203 mg/dL — ABNORMAL HIGH (ref 0–200)
HDL: 86.7 mg/dL (ref >39.00)
LDL Cholesterol: 86 mg/dL (ref 0–99)
NonHDL: 116.34
Total CHOL/HDL Ratio: 2
Triglycerides: 152 mg/dL — ABNORMAL HIGH (ref 0.0–149.0)
VLDL: 30.4 mg/dL (ref 0.0–40.0)

## 2022-09-25 LAB — TSH: TSH: 1 u[IU]/mL (ref 0.35–5.50)

## 2022-09-25 MED ORDER — OMEPRAZOLE 40 MG PO CPDR: 40.0000 mg | DELAYED_RELEASE_CAPSULE | Freq: Two times a day (BID) | ORAL | 3 refills | Status: DC

## 2022-09-25 NOTE — Assessment & Plan Note (Signed)
Chronic Not controlled Increase omeprazole to 40 mg bid Referral to GI for dysphagia

## 2022-09-25 NOTE — Assessment & Plan Note (Signed)
Chronic ?Controlled, stable ?Continue wellbutrin xl 300 mg daily, estradiol 0.5 mg daily ?Tylenol as needed ?Continue natural supplements ?

## 2022-09-25 NOTE — Assessment & Plan Note (Signed)
Chronic ?Controlled, stable ?Continue wellbutrin xl 300 mg daily ? ?

## 2022-09-25 NOTE — Assessment & Plan Note (Signed)
Chronic ?Continue estradiol 0.5 mg daily ?

## 2022-09-25 NOTE — Assessment & Plan Note (Signed)
Chronic ?Controlled, stable ?Continue welbutrin xl 300 mg daily, xanax 0.5 mg tid prn ?

## 2022-09-25 NOTE — Assessment & Plan Note (Signed)
Chronic Intermittent nausea Continue zofran prn - has two different doses for severity of nausea

## 2022-09-25 NOTE — Assessment & Plan Note (Signed)
Chronic ?Controlled, stable ?Continue triamterene-hct 37.5-25 mg daily ?

## 2022-09-25 NOTE — Assessment & Plan Note (Signed)
Having increased dysphagia H/o esophageal dilation Referral to GI Continue omeprazole - increase to 40 mg daily

## 2022-09-26 NOTE — Assessment & Plan Note (Signed)
Chronic Has had a few episodes recently and has had it in the past transient episodes of vertigo a few times a day when it occurs Taking meclizine 25 mg 3 times daily as needed No obvious infection so we will hold off on any treatment unless symptoms worsen

## 2022-09-26 NOTE — Assessment & Plan Note (Signed)
Chronic Mild, non-obstructive No angina like symptoms Statin intolerant 

## 2022-09-28 ENCOUNTER — Encounter: Payer: Self-pay | Admitting: Gastroenterology

## 2022-09-28 ENCOUNTER — Other Ambulatory Visit: Payer: Self-pay | Admitting: Internal Medicine

## 2022-09-28 DIAGNOSIS — F419 Anxiety disorder, unspecified: Secondary | ICD-10-CM

## 2022-10-07 ENCOUNTER — Encounter: Payer: Self-pay | Admitting: Internal Medicine

## 2022-10-09 ENCOUNTER — Ambulatory Visit (INDEPENDENT_AMBULATORY_CARE_PROVIDER_SITE_OTHER): Payer: Medicare Other | Admitting: Internal Medicine

## 2022-10-09 ENCOUNTER — Encounter: Payer: Self-pay | Admitting: Internal Medicine

## 2022-10-09 VITALS — BP 140/78 | HR 94 | Temp 97.9°F | Ht 59.0 in | Wt 121.0 lb

## 2022-10-09 DIAGNOSIS — M79621 Pain in right upper arm: Secondary | ICD-10-CM | POA: Diagnosis not present

## 2022-10-09 NOTE — Progress Notes (Signed)
Subjective:    Patient ID: Laura Barnes, female    DOB: 1941/05/08, 82 y.o.   MRN: [id number]      HPI Laura Barnes is here for  Chief Complaint  Patient presents with   Cyst    Knot under right armpit (hurts off and on)     Has woken up several mornings with pain in her right axilla - usually does not feel anything --- then it goes away.  It has been intermittent.  Two mornings ago it was hurting really bad and she felt a pea sized lump and it was painful.    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  At 1 point there was concern over possible breast cancer and she states that she did have a bilateral mastectomy and the pathology revealed that she did not have breast cancer  Medications and allergies reviewed with patient and updated if appropriate.  Current Outpatient Medications on File Prior to Visit  Medication Sig Dispense Refill   acetaminophen (TYLENOL) 500 MG tablet Take 2 tablets (1,000 mg total) by mouth every 6 (six) hours as needed for mild pain, moderate pain, fever or headache. 30 tablet 0   ALPRAZolam (XANAX) 0.5 MG tablet TAKE 1 TABLET BY MOUTH THREE TIMES A DAY AS NEEDED FOR ANXIETY 90 tablet 2   Amino Acids (AMINO ACID PO) Take 1 capsule by mouth 2 (two) times daily.     Ascorbic Acid (VITAMIN C) 1000 MG tablet Take 1,000 mg by mouth 2 (two) times daily.     B Complex-Biotin-FA (B COMPLETE) TABS Take 1 tablet by mouth daily. B Complete 100mg      BLACK COHOSH PO Take by mouth as needed.     buPROPion (WELLBUTRIN XL) 300 MG 24 hr tablet TAKE 1 TABLET BY MOUTH EVERY DAY 90 tablet 1   Cholecalciferol (VITAMIN D3) 1000 units CAPS Take 1,000 Units by mouth daily.     Coenzyme Q10 (CO Q-10) 200 MG CAPS Take 1 capsule by mouth 2 (two) times daily.     dicyclomine (BENTYL) 10 MG capsule Take 1 capsule (10 mg total) by mouth 3 (three) times daily before meals. Take as needed for IBS flares 30 capsule 0   ECHINACEA PO Take 1 tablet  by mouth 2 (two) times daily.     ELDERBERRY PO Take by mouth daily.     estradiol (ESTRACE) 0.5 MG tablet TAKE 1 TABLET BY MOUTH EVERY DAY 90 tablet 1   fluticasone (FLONASE) 50 MCG/ACT nasal spray Place 2 sprays into both nostrils as needed.      fluticasone (VERAMYST) 27.5 MCG/SPRAY nasal spray Place 1 spray into the nose daily.     Green Tea, Camellia sinensis, POWD Take 1 capsule by mouth daily.     Homeopathic Products ([place]) PLLT Take by mouth daily.     HYDROmorphone (DILAUDID) 2 MG tablet 2 mg every 4 (four) hours as needed for severe pain.     hydrOXYzine (VISTARIL) 25 MG capsule Take 1 capsule (25 mg total) by mouth every 8 (eight) hours as needed for itching. 30 capsule 3   ketorolac (ACULAR) 0.5 % ophthalmic solution Place 1 drop into the right eye 3 (three) times daily.     KRILL OIL PO Take by mouth daily.     LECITHIN PO Take 1 tablet by mouth daily.     levothyroxine (SYNTHROID) 75 MCG tablet TAKE 1 TABLET BY MOUTH EVERY DAY IN  THE MORNING 90 tablet 1   MAGNESIUM ASPARTATE PO Take 1 tablet by mouth 2 (two) times daily.     meclizine (ANTIVERT) 25 MG tablet Take 1 tablet (25 mg total) by mouth 3 (three) times daily as needed for dizziness. 60 tablet 5   Misc. Devices (ROLLATOR) MISC Use rollator for ambulation 1 each 0   Multiple Vitamins-Minerals (ZINC PO) Take by mouth daily.     omeprazole (PRILOSEC) 40 MG capsule Take 1 capsule (40 mg total) by mouth 2 (two) times daily before a meal. 180 capsule 3   ondansetron (ZOFRAN) 4 MG tablet TAKE 1 TABLET BY MOUTH EVERY 8 HOURS AS NEEDED FOR NAUSEA 90 tablet 0   ondansetron (ZOFRAN-ODT) 8 MG disintegrating tablet DISSOLVE 1 TABLET ON THE TONGUE EVERY 8 HOURS AS NEEDED FOR NAUSEA OR VOMITTING 90 tablet 0   POTASSIUM AMINOBENZOATE PO Take 1 tablet by mouth daily.     TART CHERRY PO Take by mouth daily.     triamterene-hydrochlorothiazide (MAXZIDE-25) 37.5-25 MG tablet TAKE 1 TABLET BY MOUTH EVERY DAY 90 tablet 1   TURMERIC  PO Take 1 tablet by mouth 2 (two) times daily.      vitamin E 400 UNIT capsule Take 400 Units by mouth daily.     No current facility-administered medications on file prior to visit.    Review of Systems     Objective:   Vitals:   10/09/22 1530  BP: (!) 140/78  Pulse: 94  Temp: 97.9 F (36.6 C)  SpO2: 98%   BP Readings from Last 3 Encounters:  10/09/22 (!) 140/78  09/25/22 128/80  07/03/22 126/74   Wt Readings from Last 3 Encounters:  10/09/22 121 lb (54.9 kg)  09/25/22 121 lb (54.9 kg)  07/03/22 124 lb (56.2 kg)   Body mass index is 24.44 kg/m.    Physical Exam Constitutional:      General: She is not in acute distress.    Appearance: Normal appearance. She is not ill-appearing.  HENT:     Head: Normocephalic and atraumatic.  Skin:    General: Skin is warm and dry.     Findings: No erythema or rash.     Comments: Right axilla tender - tender lump - pea sized  Neurological:     Mental Status: She is alert.            Assessment & Plan:    See Problem List for Assessment and Plan of chronic medical problems.

## 2022-10-09 NOTE — Patient Instructions (Signed)
      An ultrasound of your right arm pit was ordered and someone will call you to schedule an appointment.

## 2022-10-09 NOTE — Assessment & Plan Note (Signed)
New Has had some intermittent tenderness in her right axilla for a while, but has not been consistent She has felt a tender lump in that is what brought her in Tenderness on exam-generalized in the axilla I do feel at least 1 tender pea-sized lump Ultrasound of the axilla for further evaluation History of bilateral mastectomies so mammogram not needed-was determined after surgery and that she did not have breast cancer-this was decades ago no records are available

## 2022-10-19 DIAGNOSIS — Z9181 History of falling: Secondary | ICD-10-CM | POA: Diagnosis not present

## 2022-10-19 DIAGNOSIS — Z79899 Other long term (current) drug therapy: Secondary | ICD-10-CM | POA: Diagnosis not present

## 2022-10-19 DIAGNOSIS — Z0000 Encounter for general adult medical examination without abnormal findings: Secondary | ICD-10-CM | POA: Diagnosis not present

## 2022-10-19 DIAGNOSIS — M797 Fibromyalgia: Secondary | ICD-10-CM | POA: Diagnosis not present

## 2022-10-19 DIAGNOSIS — M5416 Radiculopathy, lumbar region: Secondary | ICD-10-CM | POA: Diagnosis not present

## 2022-10-19 DIAGNOSIS — G894 Chronic pain syndrome: Secondary | ICD-10-CM | POA: Diagnosis not present

## 2022-10-19 DIAGNOSIS — G589 Mononeuropathy, unspecified: Secondary | ICD-10-CM | POA: Diagnosis not present

## 2022-10-19 DIAGNOSIS — M542 Cervicalgia: Secondary | ICD-10-CM | POA: Diagnosis not present

## 2022-10-21 DIAGNOSIS — H353122 Nonexudative age-related macular degeneration, left eye, intermediate dry stage: Secondary | ICD-10-CM | POA: Diagnosis not present

## 2022-10-21 DIAGNOSIS — Z961 Presence of intraocular lens: Secondary | ICD-10-CM | POA: Diagnosis not present

## 2022-10-21 DIAGNOSIS — H353111 Nonexudative age-related macular degeneration, right eye, early dry stage: Secondary | ICD-10-CM | POA: Diagnosis not present

## 2022-10-21 DIAGNOSIS — H35453 Secondary pigmentary degeneration, bilateral: Secondary | ICD-10-CM | POA: Diagnosis not present

## 2022-10-21 DIAGNOSIS — H35363 Drusen (degenerative) of macula, bilateral: Secondary | ICD-10-CM | POA: Diagnosis not present

## 2022-10-28 ENCOUNTER — Ambulatory Visit
Admission: RE | Admit: 2022-10-28 | Discharge: 2022-10-28 | Disposition: A | Discharge status: Home / Self Care | Payer: Medicare Other | Source: Ambulatory Visit | Attending: Internal Medicine | Admitting: Internal Medicine

## 2022-10-28 ENCOUNTER — Other Ambulatory Visit: Payer: Self-pay | Admitting: Internal Medicine

## 2022-10-28 DIAGNOSIS — N63 Unspecified lump in unspecified breast: Secondary | ICD-10-CM | POA: Diagnosis not present

## 2022-10-28 DIAGNOSIS — M79621 Pain in right upper arm: Secondary | ICD-10-CM

## 2022-10-28 DIAGNOSIS — R2231 Localized swelling, mass and lump, right upper limb: Secondary | ICD-10-CM

## 2022-11-03 DIAGNOSIS — M9904 Segmental and somatic dysfunction of sacral region: Secondary | ICD-10-CM | POA: Diagnosis not present

## 2022-11-03 DIAGNOSIS — M9903 Segmental and somatic dysfunction of lumbar region: Secondary | ICD-10-CM | POA: Diagnosis not present

## 2022-11-03 DIAGNOSIS — M9905 Segmental and somatic dysfunction of pelvic region: Secondary | ICD-10-CM | POA: Diagnosis not present

## 2022-11-03 DIAGNOSIS — M5136 Other intervertebral disc degeneration, lumbar region: Secondary | ICD-10-CM | POA: Diagnosis not present

## 2022-11-06 ENCOUNTER — Telehealth: Payer: Self-pay | Admitting: Internal Medicine

## 2022-11-06 NOTE — Telephone Encounter (Signed)
Patient states she needs a refill on her ALPRAZolam (XANAX) 0.5 MG tablet.

## 2022-11-16 DIAGNOSIS — H9201 Otalgia, right ear: Secondary | ICD-10-CM | POA: Diagnosis not present

## 2022-11-16 DIAGNOSIS — R42 Dizziness and giddiness: Secondary | ICD-10-CM | POA: Diagnosis not present

## 2022-11-16 DIAGNOSIS — M2669 Other specified disorders of temporomandibular joint: Secondary | ICD-10-CM | POA: Diagnosis not present

## 2022-11-16 DIAGNOSIS — H9313 Tinnitus, bilateral: Secondary | ICD-10-CM | POA: Diagnosis not present

## 2022-11-16 DIAGNOSIS — H9042 Sensorineural hearing loss, unilateral, left ear, with unrestricted hearing on the contralateral side: Secondary | ICD-10-CM | POA: Diagnosis not present

## 2022-11-16 DIAGNOSIS — H903 Sensorineural hearing loss, bilateral: Secondary | ICD-10-CM | POA: Diagnosis not present

## 2022-11-23 ENCOUNTER — Ambulatory Visit: Payer: Medicare Other

## 2022-11-23 NOTE — Progress Notes (Unsigned)
Error, pt did not answer for AWV.

## 2022-12-02 ENCOUNTER — Encounter: Payer: Self-pay | Admitting: Internal Medicine

## 2022-12-15 ENCOUNTER — Ambulatory Visit: Payer: Medicare Other | Admitting: Gastroenterology

## 2022-12-19 ENCOUNTER — Other Ambulatory Visit: Payer: Self-pay | Admitting: Internal Medicine

## 2023-01-09 ENCOUNTER — Other Ambulatory Visit: Payer: Self-pay | Admitting: Internal Medicine

## 2023-01-09 DIAGNOSIS — F419 Anxiety disorder, unspecified: Secondary | ICD-10-CM
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
## 2023-01-26 ENCOUNTER — Ambulatory Visit: Payer: Medicare Other | Attending: Rheumatology | Admitting: Rheumatology

## 2023-01-26 ENCOUNTER — Encounter: Payer: Self-pay | Admitting: Rheumatology

## 2023-01-26 VITALS — BP 130/80 | HR 101 | Resp 15 | Ht 59.0 in | Wt 122.4 lb

## 2023-01-26 DIAGNOSIS — M51369 Other intervertebral disc degeneration, lumbar region without mention of lumbar back pain or lower extremity pain: Secondary | ICD-10-CM

## 2023-01-26 DIAGNOSIS — M19041 Primary osteoarthritis, right hand: Secondary | ICD-10-CM

## 2023-01-26 DIAGNOSIS — M5136 Other intervertebral disc degeneration, lumbar region: Secondary | ICD-10-CM

## 2023-01-26 DIAGNOSIS — G5702 Lesion of sciatic nerve, left lower limb: Secondary | ICD-10-CM | POA: Diagnosis not present

## 2023-01-26 DIAGNOSIS — M7061 Trochanteric bursitis, right hip: Secondary | ICD-10-CM | POA: Diagnosis not present

## 2023-01-26 DIAGNOSIS — E041 Nontoxic single thyroid nodule: Secondary | ICD-10-CM

## 2023-01-26 DIAGNOSIS — M797 Fibromyalgia: Secondary | ICD-10-CM | POA: Diagnosis not present

## 2023-01-26 DIAGNOSIS — F419 Anxiety disorder, unspecified: Secondary | ICD-10-CM

## 2023-01-26 DIAGNOSIS — Z8719 Personal history of other diseases of the digestive system: Secondary | ICD-10-CM

## 2023-01-26 DIAGNOSIS — I872 Venous insufficiency (chronic) (peripheral): Secondary | ICD-10-CM

## 2023-01-26 DIAGNOSIS — E039 Hypothyroidism, unspecified: Secondary | ICD-10-CM

## 2023-01-26 DIAGNOSIS — M7542 Impingement syndrome of left shoulder: Secondary | ICD-10-CM

## 2023-01-26 DIAGNOSIS — G894 Chronic pain syndrome: Secondary | ICD-10-CM | POA: Diagnosis not present

## 2023-01-26 DIAGNOSIS — M8589 Other specified disorders of bone density and structure, multiple sites: Secondary | ICD-10-CM

## 2023-01-26 DIAGNOSIS — M5030 Other cervical disc degeneration, unspecified cervical region: Secondary | ICD-10-CM | POA: Diagnosis not present

## 2023-01-26 DIAGNOSIS — Z8639 Personal history of other endocrine, nutritional and metabolic disease: Secondary | ICD-10-CM

## 2023-01-26 DIAGNOSIS — F32A Depression, unspecified: Secondary | ICD-10-CM

## 2023-01-26 DIAGNOSIS — M19042 Primary osteoarthritis, left hand: Secondary | ICD-10-CM

## 2023-01-26 DIAGNOSIS — M7062 Trochanteric bursitis, left hip: Secondary | ICD-10-CM

## 2023-01-26 DIAGNOSIS — Z79890 Hormone replacement therapy (postmenopausal): Secondary | ICD-10-CM

## 2023-03-10 ENCOUNTER — Encounter: Payer: Self-pay | Admitting: Internal Medicine

## 2023-03-17 ENCOUNTER — Other Ambulatory Visit: Payer: Self-pay | Admitting: Internal Medicine

## 2023-03-17 DIAGNOSIS — M9903 Segmental and somatic dysfunction of lumbar region: Secondary | ICD-10-CM | POA: Diagnosis not present

## 2023-03-17 DIAGNOSIS — M5136 Other intervertebral disc degeneration, lumbar region with discogenic back pain only: Secondary | ICD-10-CM | POA: Diagnosis not present

## 2023-03-17 DIAGNOSIS — M9904 Segmental and somatic dysfunction of sacral region: Secondary | ICD-10-CM | POA: Diagnosis not present

## 2023-03-17 DIAGNOSIS — M9905 Segmental and somatic dysfunction of pelvic region: Secondary | ICD-10-CM | POA: Diagnosis not present

## 2023-03-18 ENCOUNTER — Other Ambulatory Visit: Payer: Self-pay | Admitting: Internal Medicine

## 2023-03-18 DIAGNOSIS — F419 Anxiety disorder, unspecified: Secondary | ICD-10-CM

## 2023-03-28 ENCOUNTER — Encounter: Payer: Self-pay | Admitting: Internal Medicine

## 2023-03-28 NOTE — Patient Instructions (Addendum)
Blood work was ordered.   The lab is on the first floor.    Medications changes include :   none    An ultrasound of your neck was ordered and someone will call you to schedule an appointment.     Return in about 6 months (around 09/27/2023) for follow up, Schedule DEXA-[place].    Health Maintenance, Female Adopting a healthy lifestyle and getting preventive care are important in promoting health and wellness. Ask your health care provider about: The right schedule for you to have regular tests and exams. Things you can do on your own to prevent diseases and keep yourself healthy. What should I know about diet, weight, and exercise? Eat a healthy diet  Eat a diet that includes plenty of vegetables, fruits, low-fat dairy products, and lean protein. Do not eat a lot of foods that are high in solid fats, added sugars, or sodium. Maintain a healthy weight Body mass index (BMI) is used to identify weight problems. It estimates body fat based on height and weight. Your health care provider can help determine your BMI and help you achieve or maintain a healthy weight. Get regular exercise Get regular exercise. This is one of the most important things you can do for your health. Most adults should: Exercise for at least 150 minutes each week. The exercise should increase your heart rate and make you sweat (moderate-intensity exercise). Do strengthening exercises at least twice a week. This is in addition to the moderate-intensity exercise. Spend less time sitting. Even light physical activity can be beneficial. Watch cholesterol and blood lipids Have your blood tested for lipids and cholesterol at 82 years of age, then have this test every 5 years. Have your cholesterol levels checked more often if: Your lipid or cholesterol levels are high. You are older than 82 years of age. You are at high risk for heart disease. What should I know about cancer screening? Depending on your  health history and family history, you may need to have cancer screening at various ages. This may include screening for: Breast cancer. Cervical cancer. Colorectal cancer. Skin cancer. Lung cancer. What should I know about heart disease, diabetes, and high blood pressure? Blood pressure and heart disease High blood pressure causes heart disease and increases the risk of stroke. This is more likely to develop in people who have high blood pressure readings or are overweight. Have your blood pressure checked: Every 3-5 years if you are 25-81 years of age. Every year if you are 26 years old or older. Diabetes Have regular diabetes screenings. This checks your fasting blood sugar level. Have the screening done: Once every three years after age 42 if you are at a normal weight and have a low risk for diabetes. More often and at a younger age if you are overweight or have a high risk for diabetes. What should I know about preventing infection? Hepatitis B If you have a higher risk for hepatitis B, you should be screened for this virus. Talk with your health care provider to find out if you are at risk for hepatitis B infection. Hepatitis C Testing is recommended for: Everyone born from [birth year] through 1965. Anyone with known risk factors for hepatitis C. Sexually transmitted infections (STIs) Get screened for STIs, including gonorrhea and chlamydia, if: You are sexually active and are younger than 82 years of age. You are older than 82 years of age and your health care provider tells you that you  are at risk for this type of infection. Your sexual activity has changed since you were last screened, and you are at increased risk for chlamydia or gonorrhea. Ask your health care provider if you are at risk. Ask your health care provider about whether you are at high risk for HIV. Your health care provider may recommend a prescription medicine to help prevent HIV infection. If you choose to take  medicine to prevent HIV, you should first get tested for HIV. You should then be tested every 3 months for as long as you are taking the medicine. Pregnancy If you are about to stop having your period (premenopausal) and you may become pregnant, seek counseling before you get pregnant. Take 400 to 800 micrograms (mcg) of folic acid every day if you become pregnant. Ask for birth control (contraception) if you want to prevent pregnancy. Osteoporosis and menopause Osteoporosis is a disease in which the bones lose minerals and strength with aging. This can result in bone fractures. If you are 36 years old or older, or if you are at risk for osteoporosis and fractures, ask your health care provider if you should: Be screened for bone loss. Take a calcium or vitamin D supplement to lower your risk of fractures. Be given hormone replacement therapy (HRT) to treat symptoms of menopause. Follow these instructions at home: Alcohol use Do not drink alcohol if: Your health care provider tells you not to drink. You are pregnant, may be pregnant, or are planning to become pregnant. If you drink alcohol: Limit how much you have to: 0-1 drink a day. Know how much alcohol is in your drink. In the [place] one drink equals one 12 oz bottle of beer (355 mL), one 5 oz glass of wine (148 mL), or one 1 oz glass of hard liquor (44 mL). Lifestyle Do not use any products that contain nicotine or tobacco. These products include cigarettes, chewing tobacco, and vaping devices, such as e-cigarettes. If you need help quitting, ask your health care provider. Do not use street drugs. Do not share needles. Ask your health care provider for help if you need support or information about quitting drugs. General instructions Schedule regular health, dental, and eye exams. Stay current with your vaccines. Tell your health care provider if: You often feel depressed. You have ever been abused or do not feel safe at  home. Summary Adopting a healthy lifestyle and getting preventive care are important in promoting health and wellness. Follow your health care provider's instructions about healthy diet, exercising, and getting tested or screened for diseases. Follow your health care provider's instructions on monitoring your cholesterol and blood pressure. This information is not intended to replace advice given to you by your health care provider. Make sure you discuss any questions you have with your health care provider. Document Revised: 10/07/2020 Document Reviewed: 10/07/2020 Elsevier Patient Education  2024 [employer].

## 2023-03-28 NOTE — Progress Notes (Unsigned)
Subjective:    Patient ID: Laura Barnes, female    DOB: 1941/02/09, 82 y.o.   MRN: [id number]      HPI Laura Barnes is here for a Physical exam and her chronic medical problems.    Overall doing well.  Continues to have intermittent dizziness.  Continues to be in pain from her fibromyalgia.  Medications and allergies reviewed with patient and updated if appropriate.  Current Outpatient Medications on File Prior to Visit  Medication Sig Dispense Refill   acetaminophen (TYLENOL) 500 MG tablet Take 2 tablets (1,000 mg total) by mouth every 6 (six) hours as needed for mild pain, moderate pain, fever or headache. 30 tablet 0   ALPRAZolam (XANAX) 0.5 MG tablet TAKE 1 TABLET BY MOUTH THREE TIMES A DAY AS NEEDED FOR ANXIETY 90 tablet 1   Amino Acids (AMINO ACID PO) Take 1 capsule by mouth 2 (two) times daily.     Ascorbic Acid (VITAMIN C) 1000 MG tablet Take 1,000 mg by mouth 2 (two) times daily.     B Complex-Biotin-FA (B COMPLETE) TABS Take 1 tablet by mouth daily. B Complete 100mg      BLACK COHOSH PO Take by mouth as needed.     buPROPion (WELLBUTRIN XL) 300 MG 24 hr tablet TAKE 1 TABLET BY MOUTH EVERY DAY 90 tablet 1   Cholecalciferol (VITAMIN D3) 1000 units CAPS Take 1,000 Units by mouth daily.     Coenzyme Q10 (CO Q-10) 200 MG CAPS Take 1 capsule by mouth 2 (two) times daily.     dicyclomine (BENTYL) 10 MG capsule Take 1 capsule (10 mg total) by mouth 3 (three) times daily before meals. Take as needed for IBS flares 30 capsule 0   ECHINACEA PO Take 1 tablet by mouth 2 (two) times daily.     ELDERBERRY PO Take by mouth daily.     estradiol (ESTRACE) 0.5 MG tablet TAKE 1 TABLET BY MOUTH EVERY DAY 90 tablet 1   Green Tea, Camellia sinensis, POWD Take 1 capsule by mouth daily.     Homeopathic Products ([place]ICA [place]) PLLT Take by mouth daily.     HYDROmorphone (DILAUDID) 2 MG tablet 2 mg every 4 (four) hours as needed for severe pain.     hydrOXYzine (VISTARIL) 25 MG capsule Take 1  capsule (25 mg total) by mouth every 8 (eight) hours as needed for itching. 30 capsule 3   KRILL OIL PO Take by mouth daily.     LECITHIN PO Take 1 tablet by mouth daily.     levothyroxine (SYNTHROID) 75 MCG tablet TAKE 1 TABLET BY MOUTH EVERY DAY IN THE MORNING 90 tablet 1   MAGNESIUM ASPARTATE PO Take 1 tablet by mouth 2 (two) times daily.     meclizine (ANTIVERT) 25 MG tablet Take 1 tablet (25 mg total) by mouth 3 (three) times daily as needed for dizziness. 60 tablet 5   Misc. Devices (ROLLATOR) MISC Use rollator for ambulation 1 each 0   Multiple Vitamins-Minerals (ZINC PO) Take by mouth daily.     omeprazole (PRILOSEC) 40 MG capsule Take 1 capsule (40 mg total) by mouth 2 (two) times daily before a meal. 180 capsule 3   ondansetron (ZOFRAN) 4 MG tablet TAKE 1 TABLET BY MOUTH EVERY 8 HOURS AS NEEDED FOR NAUSEA 90 tablet 0   ondansetron (ZOFRAN-ODT) 8 MG disintegrating tablet DISSOLVE 1 TABLET ON THE TONGUE EVERY 8 HOURS AS NEEDED FOR NAUSEA OR VOMITTING 90 tablet 0   POTASSIUM AMINOBENZOATE  PO Take 1 tablet by mouth daily.     Propylene Glycol (SYSTANE COMPLETE OP) Apply to eye as needed.     TART CHERRY PO Take by mouth daily.     triamterene-hydrochlorothiazide (MAXZIDE-25) 37.5-25 MG tablet TAKE 1 TABLET BY MOUTH EVERY DAY 90 tablet 1   TURMERIC PO Take 1 tablet by mouth 2 (two) times daily.      vitamin E 400 UNIT capsule Take 400 Units by mouth daily.     No current facility-administered medications on file prior to visit.    Review of Systems  Constitutional:  Negative for fever.  HENT:  Positive for ear pain (left side) and postnasal drip.        TMJ pain on left  Eyes:  Negative for visual disturbance.  Respiratory:  Positive for cough (occ). Negative for shortness of breath and wheezing.   Cardiovascular:  Negative for chest pain, palpitations and leg swelling.  Gastrointestinal:  Positive for constipation (chronic) and diarrhea (occ). Negative for abdominal pain and blood  in stool.       Frequent gerd  Genitourinary:  Negative for dysuria.  Musculoskeletal:  Positive for arthralgias, back pain and myalgias.  Skin:  Negative for rash.  Neurological:  Positive for dizziness (occ), light-headedness and headaches.  Psychiatric/Behavioral:  Positive for dysphoric mood. The patient is nervous/anxious.        Objective:   Vitals:   03/29/23 1433  BP: (!) 140/88  Pulse: 80  Temp: 98 F (36.7 C)  SpO2: 95%   Filed Weights   03/29/23 1433  Weight: 120 lb (54.4 kg)   Body mass index is 24.24 kg/m.  BP Readings from Last 3 Encounters:  03/29/23 (!) 140/88  01/26/23 130/80  10/09/22 (!) 140/78    Wt Readings from Last 3 Encounters:  03/29/23 120 lb (54.4 kg)  01/26/23 122 lb 6.4 oz (55.5 kg)  10/09/22 121 lb (54.9 kg)       Physical Exam Constitutional: She appears well-developed and well-nourished. No distress.  HENT:  Head: Normocephalic and atraumatic.  Right Ear: External ear normal. Normal ear canal and TM Left Ear: External ear normal.  Normal ear canal and TM Mouth/Throat: Oropharynx is clear and moist.  Eyes: Conjunctivae normal.  Neck: Neck supple. No tracheal deviation present. No thyromegaly present.  No carotid bruit  Cardiovascular: Normal rate, regular rhythm and normal heart sounds.   No murmur heard.  No edema. Pulmonary/Chest: Effort normal and breath sounds normal. No respiratory distress. She has no wheezes. She has no rales.  Breast: deferred   Abdominal: Soft. She exhibits no distension. There is no tenderness.  Lymphadenopathy: She has no cervical adenopathy.  Skin: Skin is warm and dry. She is not diaphoretic.  Psychiatric: She has a normal mood and affect. Her behavior is normal.     Lab Results  Component Value Date   WBC 10.5 09/25/2022   HGB 13.3 09/25/2022   HCT 39.2 09/25/2022   PLT 287.0 09/25/2022   GLUCOSE 105 (H) 09/25/2022   CHOL 203 (H) 09/25/2022   TRIG 152.0 (H) 09/25/2022   HDL 86.70  09/25/2022   LDLDIRECT 119.6 04/06/2013   LDLCALC 86 09/25/2022   ALT 17 09/25/2022   AST 24 09/25/2022   NA 137 09/25/2022   K 3.7 09/25/2022   CL 100 09/25/2022   CREATININE 0.79 09/25/2022   BUN 21 09/25/2022   CO2 27 09/25/2022   TSH 1.00 09/25/2022   HGBA1C 5.3 01/11/2021  Assessment & Plan:   Physical exam: Screening blood work  ordered Exercise none Weight normal Substance abuse  none   Reviewed recommended immunizations.     Health Maintenance  Topic Date Due   DEXA SCAN  04/10/2022   INFLUENZA VACCINE  12/31/2022   Medicare Annual Wellness (AWV)  10/19/2023   DTaP/Tdap/Td (3 - Td or Tdap) 03/23/2029   Pneumonia Vaccine 45+ Years old  Completed   Zoster Vaccines- Shingrix  Completed   HPV VACCINES  Aged Out   COVID-19 Vaccine  Discontinued   Hepatitis C Screening  Discontinued          See Problem List for Assessment and Plan of chronic medical problems.

## 2023-03-29 ENCOUNTER — Ambulatory Visit (INDEPENDENT_AMBULATORY_CARE_PROVIDER_SITE_OTHER): Payer: Medicare Other | Admitting: Internal Medicine

## 2023-03-29 VITALS — BP 140/88 | HR 80 | Temp 98.0°F | Ht 59.0 in | Wt 120.0 lb

## 2023-03-29 DIAGNOSIS — K219 Gastro-esophageal reflux disease without esophagitis: Secondary | ICD-10-CM | POA: Diagnosis not present

## 2023-03-29 DIAGNOSIS — M810 Age-related osteoporosis without current pathological fracture: Secondary | ICD-10-CM

## 2023-03-29 DIAGNOSIS — R42 Dizziness and giddiness: Secondary | ICD-10-CM

## 2023-03-29 DIAGNOSIS — I700 Atherosclerosis of aorta: Secondary | ICD-10-CM

## 2023-03-29 DIAGNOSIS — E782 Mixed hyperlipidemia: Secondary | ICD-10-CM | POA: Diagnosis not present

## 2023-03-29 DIAGNOSIS — I2510 Atherosclerotic heart disease of native coronary artery without angina pectoris: Secondary | ICD-10-CM

## 2023-03-29 DIAGNOSIS — Z0000 Encounter for general adult medical examination without abnormal findings: Secondary | ICD-10-CM | POA: Diagnosis not present

## 2023-03-29 DIAGNOSIS — M542 Cervicalgia: Secondary | ICD-10-CM

## 2023-03-29 DIAGNOSIS — R110 Nausea: Secondary | ICD-10-CM

## 2023-03-29 DIAGNOSIS — E039 Hypothyroidism, unspecified: Secondary | ICD-10-CM | POA: Diagnosis not present

## 2023-03-29 DIAGNOSIS — F419 Anxiety disorder, unspecified: Secondary | ICD-10-CM

## 2023-03-29 DIAGNOSIS — E876 Hypokalemia: Secondary | ICD-10-CM

## 2023-03-29 DIAGNOSIS — M797 Fibromyalgia: Secondary | ICD-10-CM | POA: Diagnosis not present

## 2023-03-29 DIAGNOSIS — Z79890 Hormone replacement therapy (postmenopausal): Secondary | ICD-10-CM

## 2023-03-29 DIAGNOSIS — F3289 Other specified depressive episodes: Secondary | ICD-10-CM

## 2023-03-29 LAB — CBC WITH DIFFERENTIAL/PLATELET
Basophils Absolute: 0.1 10*3/uL (ref 0.0–0.1)
Basophils Relative: 0.4 % (ref 0.0–3.0)
Eosinophils Absolute: 0.1 10*3/uL (ref 0.0–0.7)
Eosinophils Relative: 1.1 % (ref 0.0–5.0)
HCT: 40.5 % (ref 36.0–46.0)
Hemoglobin: 13.4 g/dL (ref 12.0–15.0)
Lymphocytes Relative: 18.9 % (ref 12.0–46.0)
Lymphs Abs: 2.3 10*3/uL (ref 0.7–4.0)
MCHC: 33.2 g/dL (ref 30.0–36.0)
MCV: 98.9 fL (ref 78.0–100.0)
Monocytes Absolute: 1 10*3/uL (ref 0.1–1.0)
Monocytes Relative: 7.9 % (ref 3.0–12.0)
Neutro Abs: 8.8 10*3/uL — ABNORMAL HIGH (ref 1.4–7.7)
Neutrophils Relative %: 71.7 % (ref 43.0–77.0)
Platelets: 275 10*3/uL (ref 150.0–400.0)
RBC: 4.1 Mil/uL (ref 3.87–5.11)
RDW: 13.6 % (ref 11.5–15.5)
WBC: 12.4 10*3/uL — ABNORMAL HIGH (ref 4.0–10.5)

## 2023-03-29 LAB — TSH: TSH: 0.41 u[IU]/mL (ref 0.35–5.50)

## 2023-03-29 MED ORDER — MECLIZINE HCL 25 MG PO TABS: 25.0000 mg | ORAL_TABLET | Freq: Three times a day (TID) | ORAL | 5 refills | Status: AC | PRN

## 2023-03-29 NOTE — Assessment & Plan Note (Signed)
Acute having increased pain in the left side of her neck and feels a lump there-?  Lymph node States this has been there for a while, but has gotten worse recently Will do ultrasound of the soft tissues to evaluate further

## 2023-03-29 NOTE — Assessment & Plan Note (Signed)
Chronic ?Controlled, stable ?Continue welbutrin xl 300 mg daily, xanax 0.5 mg tid prn ?

## 2023-03-29 NOTE — Assessment & Plan Note (Signed)
Chronic Controlled Continue omeprazole to 40 mg bid

## 2023-03-29 NOTE — Assessment & Plan Note (Signed)
Chronic ?Controlled, stable ?Continue wellbutrin xl 300 mg daily, estradiol 0.5 mg daily ?Tylenol as needed ?Continue natural supplements ?

## 2023-03-29 NOTE — Assessment & Plan Note (Signed)
Chronic ?Continue estradiol 0.5 mg daily ?

## 2023-03-29 NOTE — Assessment & Plan Note (Signed)
Chronic ?Controlled, stable ?Continue wellbutrin xl 300 mg daily ? ?

## 2023-03-29 NOTE — Assessment & Plan Note (Signed)
Chronic Will check lipids at next visit Lifestyle controlled Regular exercise and healthy diet encouraged

## 2023-03-29 NOTE — Assessment & Plan Note (Signed)
Chronic DEXA due Not on medication Taking calcium and vitamin d Will check vitamin d level

## 2023-03-29 NOTE — Assessment & Plan Note (Signed)
Chronic Intermittent nausea Continue zofran prn - has two different doses for severity of nausea

## 2023-03-29 NOTE — Assessment & Plan Note (Signed)
Chronic Continues to have intermittent vertigo Some of her vertigo is related to changes in head movement.  She also knows it could be related to her fibromyalgia Taking meclizine as needed which seems to help Discussed that we could undergo further evaluation, possible PT if she wishes, but at this point she would like to continue the vertigo as needed since it is helping

## 2023-03-29 NOTE — Assessment & Plan Note (Signed)
Chronic Mild  Does not want to take medication Has improved diet Advised to keep active

## 2023-03-29 NOTE — Assessment & Plan Note (Signed)
Chronic Mild, non-obstructive No angina like symptoms Statin intolerant

## 2023-03-29 NOTE — Assessment & Plan Note (Signed)
Chronic  Clinically euthyroid Continue taking levothyroxine 75 mcg Check tsh and will titrate medication dose as needed

## 2023-03-30 ENCOUNTER — Telehealth: Payer: Self-pay

## 2023-03-30 LAB — COMPREHENSIVE METABOLIC PANEL
ALT: 17 U/L (ref 0–35)
AST: 24 U/L (ref 0–37)
Albumin: 4.4 g/dL (ref 3.5–5.2)
Alkaline Phosphatase: 47 U/L (ref 39–117)
BUN: 23 mg/dL (ref 6–23)
CO2: 29 meq/L (ref 19–32)
Calcium: 10.4 mg/dL (ref 8.4–10.5)
Chloride: 101 meq/L (ref 96–112)
Creatinine, Ser: 1.01 mg/dL (ref 0.40–1.20)
GFR: 51.94 mL/min — ABNORMAL LOW (ref >60.00)
Glucose, Bld: 100 mg/dL — ABNORMAL HIGH (ref 70–99)
Potassium: 3.7 meq/L (ref 3.5–5.1)
Sodium: 139 meq/L (ref 135–145)
Total Bilirubin: 0.4 mg/dL (ref 0.2–1.2)
Total Protein: 7.8 g/dL (ref 6.0–8.3)

## 2023-03-30 LAB — LIPID PANEL
Cholesterol: 196 mg/dL (ref 0–200)
HDL: 80.4 mg/dL (ref >39.00)
LDL Cholesterol: 87 mg/dL (ref 0–99)
NonHDL: 115.97
Total CHOL/HDL Ratio: 2
Triglycerides: 145 mg/dL (ref 0.0–149.0)
VLDL: 29 mg/dL (ref 0.0–40.0)

## 2023-03-30 LAB — VITAMIN D 25 HYDROXY (VIT D DEFICIENCY, FRACTURES): VITD: >120 ng/mL

## 2023-03-30 NOTE — Telephone Encounter (Signed)
noted 

## 2023-03-31 ENCOUNTER — Inpatient Hospital Stay (INDEPENDENT_AMBULATORY_CARE_PROVIDER_SITE_OTHER)
Admission: RE | Admit: 2023-03-31 | Discharge: 2023-03-31 | Discharge status: Home / Self Care | Payer: Medicare Other | Source: Ambulatory Visit | Attending: Internal Medicine

## 2023-03-31 DIAGNOSIS — M810 Age-related osteoporosis without current pathological fracture: Secondary | ICD-10-CM

## 2023-04-01 ENCOUNTER — Ambulatory Visit
Admission: RE | Admit: 2023-04-01 | Discharge: 2023-04-01 | Disposition: A | Discharge status: Home / Self Care | Payer: Medicare Other | Source: Ambulatory Visit | Attending: Internal Medicine | Admitting: Internal Medicine

## 2023-04-01 DIAGNOSIS — M542 Cervicalgia: Secondary | ICD-10-CM | POA: Diagnosis not present

## 2023-04-04 ENCOUNTER — Encounter: Payer: Self-pay | Admitting: Internal Medicine

## 2023-04-19 DIAGNOSIS — H35363 Drusen (degenerative) of macula, bilateral: Secondary | ICD-10-CM | POA: Diagnosis not present

## 2023-04-19 DIAGNOSIS — H353111 Nonexudative age-related macular degeneration, right eye, early dry stage: Secondary | ICD-10-CM | POA: Diagnosis not present

## 2023-04-19 DIAGNOSIS — H353122 Nonexudative age-related macular degeneration, left eye, intermediate dry stage: Secondary | ICD-10-CM | POA: Diagnosis not present

## 2023-04-20 DIAGNOSIS — M5136 Other intervertebral disc degeneration, lumbar region with discogenic back pain only: Secondary | ICD-10-CM | POA: Diagnosis not present

## 2023-04-20 DIAGNOSIS — M9904 Segmental and somatic dysfunction of sacral region: Secondary | ICD-10-CM | POA: Diagnosis not present

## 2023-04-20 DIAGNOSIS — M9905 Segmental and somatic dysfunction of pelvic region: Secondary | ICD-10-CM | POA: Diagnosis not present

## 2023-04-20 DIAGNOSIS — M9903 Segmental and somatic dysfunction of lumbar region: Secondary | ICD-10-CM | POA: Diagnosis not present

## 2023-04-26 ENCOUNTER — Encounter: Payer: Self-pay | Admitting: Internal Medicine

## 2023-05-03 ENCOUNTER — Other Ambulatory Visit: Payer: Self-pay | Admitting: Internal Medicine

## 2023-05-03 ENCOUNTER — Ambulatory Visit
Admission: RE | Admit: 2023-05-03 | Discharge: 2023-05-03 | Disposition: A | Discharge status: Home / Self Care | Payer: Medicare Other | Source: Ambulatory Visit | Attending: Internal Medicine | Admitting: Internal Medicine

## 2023-05-03 DIAGNOSIS — R2231 Localized swelling, mass and lump, right upper limb: Secondary | ICD-10-CM

## 2023-05-03 DIAGNOSIS — M79621 Pain in right upper arm: Secondary | ICD-10-CM

## 2023-05-04 ENCOUNTER — Ambulatory Visit: Payer: Medicare Other | Admitting: Internal Medicine

## 2023-05-04 ENCOUNTER — Encounter: Payer: Self-pay | Admitting: Internal Medicine

## 2023-05-04 VITALS — BP 140/72 | HR 96 | Temp 97.9°F | Ht 59.0 in | Wt 126.0 lb

## 2023-05-04 DIAGNOSIS — J0120 Acute ethmoidal sinusitis, unspecified: Secondary | ICD-10-CM

## 2023-05-04 MED ORDER — AZITHROMYCIN 250 MG PO TABS: ORAL_TABLET | ORAL | 0 refills | Status: DC

## 2023-05-04 NOTE — Assessment & Plan Note (Addendum)
Acute H/o clogged sinus in the past - ? Need to see ENT Likely bacterial  Start zpak otc cold medications Rest, fluid Call if no improvement

## 2023-05-04 NOTE — Patient Instructions (Addendum)
        Medications changes include :   Zpak      Return if symptoms worsen or fail to improve.

## 2023-05-04 NOTE — Progress Notes (Signed)
Subjective:    Patient ID: Laura Barnes, female    DOB: 05-Apr-1941, 82 y.o.   MRN: [id number]      HPI Laura Barnes is here for  Chief Complaint  Patient presents with   Eye Pain    Left eye pain x 1 week 1/2; Feels like its sinus; Green mucus with blood tinged; Wants a referral for ENT to see if she has any sinus blockages     Pain started in left temple to left orbit and ear down into neck. She has a lot of pressure and soreness there.  Her eyes feel like they are burning.   Mouth is dry.  Throat is sore.  Nasal mucus has been green and bloody at times.     Medications and allergies reviewed with patient and updated if appropriate.  Current Outpatient Medications on File Prior to Visit  Medication Sig Dispense Refill   acetaminophen (TYLENOL) 500 MG tablet Take 2 tablets (1,000 mg total) by mouth every 6 (six) hours as needed for mild pain, moderate pain, fever or headache. 30 tablet 0   ALPRAZolam (XANAX) 0.5 MG tablet TAKE 1 TABLET BY MOUTH THREE TIMES A DAY AS NEEDED FOR ANXIETY 90 tablet 1   Amino Acids (AMINO ACID PO) Take 1 capsule by mouth 2 (two) times daily.     Ascorbic Acid (VITAMIN C) 1000 MG tablet Take 1,000 mg by mouth 2 (two) times daily.     B Complex-Biotin-FA (B COMPLETE) TABS Take 1 tablet by mouth daily. B Complete 100mg      BLACK COHOSH PO Take by mouth as needed.     buPROPion (WELLBUTRIN XL) 300 MG 24 hr tablet TAKE 1 TABLET BY MOUTH EVERY DAY 90 tablet 1   Cholecalciferol (VITAMIN D3) 1000 units CAPS Take 1,000 Units by mouth daily.     Coenzyme Q10 (CO Q-10) 200 MG CAPS Take 1 capsule by mouth 2 (two) times daily.     dicyclomine (BENTYL) 10 MG capsule Take 1 capsule (10 mg total) by mouth 3 (three) times daily before meals. Take as needed for IBS flares 30 capsule 0   ECHINACEA PO Take 1 tablet by mouth 2 (two) times daily.     ELDERBERRY PO Take by mouth daily.     estradiol (ESTRACE) 0.5 MG tablet TAKE 1 TABLET BY MOUTH EVERY DAY 90 tablet 1    Green Tea, Camellia sinensis, POWD Take 1 capsule by mouth daily.     Homeopathic Products ([place]) PLLT Take by mouth daily.     HYDROmorphone (DILAUDID) 2 MG tablet 2 mg every 4 (four) hours as needed for severe pain.     hydrOXYzine (VISTARIL) 25 MG capsule Take 1 capsule (25 mg total) by mouth every 8 (eight) hours as needed for itching. 30 capsule 3   KRILL OIL PO Take by mouth daily.     LECITHIN PO Take 1 tablet by mouth daily.     levothyroxine (SYNTHROID) 75 MCG tablet TAKE 1 TABLET BY MOUTH EVERY DAY IN THE MORNING 90 tablet 1   MAGNESIUM ASPARTATE PO Take 1 tablet by mouth 2 (two) times daily.     meclizine (ANTIVERT) 25 MG tablet Take 1 tablet (25 mg total) by mouth 3 (three) times daily as needed for dizziness. 60 tablet 5   Misc. Devices (ROLLATOR) MISC Use rollator for ambulation 1 each 0   Multiple Vitamins-Minerals (ZINC PO) Take by mouth daily.     omeprazole (PRILOSEC) 40 MG capsule  Take 1 capsule (40 mg total) by mouth 2 (two) times daily before a meal. 180 capsule 3   ondansetron (ZOFRAN) 4 MG tablet TAKE 1 TABLET BY MOUTH EVERY 8 HOURS AS NEEDED FOR NAUSEA 90 tablet 0   ondansetron (ZOFRAN-ODT) 8 MG disintegrating tablet DISSOLVE 1 TABLET ON THE TONGUE EVERY 8 HOURS AS NEEDED FOR NAUSEA OR VOMITTING 90 tablet 0   POTASSIUM AMINOBENZOATE PO Take 1 tablet by mouth daily.     Propylene Glycol (SYSTANE COMPLETE OP) Apply to eye as needed.     TART CHERRY PO Take by mouth daily.     triamterene-hydrochlorothiazide (MAXZIDE-25) 37.5-25 MG tablet TAKE 1 TABLET BY MOUTH EVERY DAY 90 tablet 1   TURMERIC PO Take 1 tablet by mouth 2 (two) times daily.      vitamin E 400 UNIT capsule Take 400 Units by mouth daily.     No current facility-administered medications on file prior to visit.    Review of Systems  Constitutional:  Negative for fever (low grade).  HENT:  Positive for congestion, postnasal drip, sinus pressure, sinus pain and sore throat.   Respiratory:  Positive  for cough (mild). Negative for shortness of breath and wheezing.   Neurological:  Positive for dizziness, light-headedness and headaches.       Objective:   Vitals:   05/04/23 1347  BP: (!) 140/72  Pulse: 96  Temp: 97.9 F (36.6 C)  SpO2: 98%   BP Readings from Last 3 Encounters:  05/04/23 (!) 140/72  03/29/23 (!) 140/88  01/26/23 130/80   Wt Readings from Last 3 Encounters:  05/04/23 126 lb (57.2 kg)  03/29/23 120 lb (54.4 kg)  01/26/23 122 lb 6.4 oz (55.5 kg)   Body mass index is 25.45 kg/m.    Physical Exam Constitutional:      General: She is not in acute distress.    Appearance: Normal appearance. She is not ill-appearing.  HENT:     Head: Normocephalic and atraumatic.     Right Ear: Tympanic membrane, ear canal and external ear normal.     Left Ear: Tympanic membrane, ear canal and external ear normal.     Mouth/Throat:     Mouth: Mucous membranes are moist.     Pharynx: No oropharyngeal exudate or posterior oropharyngeal erythema.  Eyes:     Conjunctiva/sclera: Conjunctivae normal.  Cardiovascular:     Rate and Rhythm: Normal rate and regular rhythm.  Pulmonary:     Effort: Pulmonary effort is normal. No respiratory distress.     Breath sounds: Normal breath sounds. No wheezing or rales.  Musculoskeletal:     Cervical back: Neck supple. No tenderness.  Lymphadenopathy:     Cervical: No cervical adenopathy.  Skin:    General: Skin is warm and dry.  Neurological:     Mental Status: She is alert.            Assessment & Plan:    See Problem List for Assessment and Plan of chronic medical problems.

## 2023-05-11 ENCOUNTER — Encounter: Payer: Self-pay | Admitting: Internal Medicine

## 2023-05-12 MED ORDER — HYDROXYZINE PAMOATE 25 MG PO CAPS: 25.0000 mg | ORAL_CAPSULE | Freq: Three times a day (TID) | ORAL | 3 refills | Status: AC | PRN

## 2023-05-23 ENCOUNTER — Encounter: Payer: Self-pay | Admitting: Internal Medicine

## 2023-05-27 DIAGNOSIS — H26492 Other secondary cataract, left eye: Secondary | ICD-10-CM | POA: Diagnosis not present

## 2023-05-27 DIAGNOSIS — H353131 Nonexudative age-related macular degeneration, bilateral, early dry stage: Secondary | ICD-10-CM | POA: Diagnosis not present

## 2023-06-04 ENCOUNTER — Other Ambulatory Visit: Payer: Self-pay | Admitting: Internal Medicine

## 2023-06-04 DIAGNOSIS — F419 Anxiety disorder, unspecified: Secondary | ICD-10-CM

## 2023-06-09 ENCOUNTER — Ambulatory Visit: Payer: Medicare Other | Admitting: Internal Medicine

## 2023-06-09 ENCOUNTER — Encounter: Payer: Self-pay | Admitting: Internal Medicine

## 2023-06-09 VITALS — BP 122/68 | HR 82 | Temp 98.5°F | Ht 59.0 in | Wt 125.0 lb

## 2023-06-09 DIAGNOSIS — K5909 Other constipation: Secondary | ICD-10-CM | POA: Diagnosis not present

## 2023-06-09 DIAGNOSIS — R1032 Left lower quadrant pain: Secondary | ICD-10-CM | POA: Diagnosis not present

## 2023-06-09 DIAGNOSIS — F419 Anxiety disorder, unspecified: Secondary | ICD-10-CM

## 2023-06-09 DIAGNOSIS — K219 Gastro-esophageal reflux disease without esophagitis: Secondary | ICD-10-CM

## 2023-06-09 MED ORDER — ALPRAZOLAM 0.5 MG PO TABS: ORAL_TABLET | ORAL | Status: DC

## 2023-06-09 NOTE — Assessment & Plan Note (Signed)
 Chronic Has had chronic constipation forever Bowel movements very, but typically is constipated Currently taking a new supplement which seems to be helping Takes a probiotic, drinks plenty of fluids Will continue to work on her constipation naturally Discussed that it is important to prevent constipation which is likely causing some of her abdominal pain

## 2023-06-09 NOTE — Assessment & Plan Note (Signed)
 Chronic Does have flares of GERD and has had increased GERD recently Discussed controlling constipation will help keep her GERD controlled Continue omeprazole 40 mg daily

## 2023-06-09 NOTE — Progress Notes (Signed)
 Subjective:    Patient ID: Laura Barnes, female    DOB: Apr 21, 1941, 83 y.o.   MRN: [id number]      HPI Laura Barnes is here for  Chief Complaint  Patient presents with   Gastroesophageal Reflux    Acid reflux that comes and goes; Also wants to go over old op report from Dr. Sheldon back in August 2022    October started having a lot of stomach issues - went on ginger-ale and cracker diet for two months.  She is not able to take anything because everything makes her throw up.       She has been having lower abdominal cramping.  Has been having formed stool and going frequently.  Stools eventually become very mushy.  The next day her stool will be pebbles.  She has had to manually disimpact herself.  Numbness is new-she does have IBS and has to deal with chronic constipation.   LLQ tenderness - soreness and hurts and it flares several times - started about one year ago.  Will have periods of no pain.  Right now - it is a mild soreness.  The area is more sore when she can not go to the bathroom and when she goes a lot.  She does have chronic constipation.    Doing isometric exercises.  Has gained weight and does not understand why.   Occ can hear heart beating in ear.  Occ has lightheadedness when she moves forward.    Taking dulcolax for constipation.  She just started a ginger, turmeric supplement and it is helping. She takes a probiotic.   Has sores corner of mouth - will last two weeks - usually associated with stomach symptoms.  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  She wonders if this is starting to happen again.   Medications and allergies reviewed with patient and updated if appropriate.  Current Outpatient Medications on File Prior to Visit  Medication Sig Dispense Refill   acetaminophen  (TYLENOL ) 500 MG tablet Take 2 tablets (1,000 mg total) by mouth every 6  (six) hours as needed for mild pain, moderate pain, fever or headache. 30 tablet 0   ALPRAZolam  (XANAX ) 0.5 MG tablet TAKE 1 TABLET BY MOUTH THREE TIMES A DAY AS NEEDED FOR ANXIETY 90 tablet 0   Amino Acids (AMINO ACID PO) Take 1 capsule by mouth 2 (two) times daily.     Ascorbic Acid (VITAMIN C) 1000 MG tablet Take 1,000 mg by mouth 2 (two) times daily.     azithromycin  (ZITHROMAX ) 250 MG tablet Take two tabs the first day and then one tab daily for four days 6 tablet 0   B Complex-Biotin-FA (B COMPLETE) TABS Take 1 tablet by mouth daily. B Complete 100mg      BLACK COHOSH PO Take by mouth as needed.     buPROPion  (WELLBUTRIN  XL) 300 MG 24 hr tablet TAKE 1 TABLET BY MOUTH EVERY DAY 90 tablet 1   Cholecalciferol (VITAMIN D3) 1000 units CAPS Take 1,000 Units by mouth daily.     Coenzyme Q10 (CO Q-10) 200 MG CAPS Take 1 capsule by mouth 2 (two) times daily.     dicyclomine  (BENTYL ) 10 MG capsule Take 1 capsule (10 mg total) by mouth 3 (three) times daily before meals. Take as needed for IBS flares 30 capsule 0   ECHINACEA PO Take 1 tablet  by mouth 2 (two) times daily.     ELDERBERRY PO Take by mouth daily.     estradiol  (ESTRACE ) 0.5 MG tablet TAKE 1 TABLET BY MOUTH EVERY DAY 90 tablet 1   Green Tea, Camellia sinensis, POWD Take 1 capsule by mouth daily.     Homeopathic Products (ARNICA [place] ) PLLT Take by mouth daily.     HYDROmorphone  (DILAUDID ) 2 MG tablet 2 mg every 4 (four) hours as needed for severe pain.     hydrOXYzine  (VISTARIL ) 25 MG capsule Take 1 capsule (25 mg total) by mouth every 8 (eight) hours as needed for itching. 30 capsule 3   KRILL OIL PO Take by mouth daily.     LECITHIN PO Take 1 tablet by mouth daily.     levothyroxine  (SYNTHROID ) 75 MCG tablet TAKE 1 TABLET BY MOUTH EVERY DAY IN THE MORNING 90 tablet 1   MAGNESIUM  ASPARTATE PO Take 1 tablet by mouth 2 (two) times daily.     meclizine  (ANTIVERT ) 25 MG tablet Take 1 tablet (25 mg total) by mouth 3 (three) times daily as  needed for dizziness. 60 tablet 5   Misc. Devices (ROLLATOR) MISC Use rollator for ambulation 1 each 0   Multiple Vitamins-Minerals (ZINC PO) Take by mouth daily.     omeprazole  (PRILOSEC) 40 MG capsule Take 1 capsule (40 mg total) by mouth 2 (two) times daily before a meal. 180 capsule 3   ondansetron  (ZOFRAN ) 4 MG tablet TAKE 1 TABLET BY MOUTH EVERY 8 HOURS AS NEEDED FOR NAUSEA 90 tablet 0   ondansetron  (ZOFRAN -ODT) 8 MG disintegrating tablet DISSOLVE 1 TABLET ON THE TONGUE EVERY 8 HOURS AS NEEDED FOR NAUSEA OR VOMITTING 90 tablet 0   POTASSIUM AMINOBENZOATE PO Take 1 tablet by mouth daily.     Propylene Glycol (SYSTANE COMPLETE OP) Apply to eye as needed.     TART CHERRY PO Take by mouth daily.     triamterene -hydrochlorothiazide  (MAXZIDE -25) 37.5-25 MG tablet TAKE 1 TABLET BY MOUTH EVERY DAY 90 tablet 1   TURMERIC PO Take 1 tablet by mouth 2 (two) times daily.      vitamin E 400 UNIT capsule Take 400 Units by mouth daily.     No current facility-administered medications on file prior to visit.    Review of Systems  Constitutional:  Negative for fever.  HENT:  Positive for nosebleeds.   Gastrointestinal:  Positive for abdominal pain and constipation. Negative for blood in stool.  Neurological:  Positive for headaches (chronic).       Objective:   Vitals:   06/09/23 1404  BP: 122/68  Pulse: 82  Temp: 98.5 F (36.9 C)  SpO2: 95%   BP Readings from Last 3 Encounters:  06/09/23 122/68  05/04/23 (!) 140/72  03/29/23 (!) 140/88   Wt Readings from Last 3 Encounters:  06/09/23 125 lb (56.7 kg)  05/04/23 126 lb (57.2 kg)  03/29/23 120 lb (54.4 kg)   Body mass index is 25.25 kg/m.    Physical Exam Constitutional:      General: She is not in acute distress.    Appearance: Normal appearance. She is not ill-appearing.  HENT:     Head: Normocephalic and atraumatic.  Abdominal:     General: There is no distension.     Palpations: Abdomen is soft. There is no mass.      Tenderness: There is abdominal tenderness (diffuse mild tenderness, moderate tendenress in LLQ). There is no guarding or rebound.  Hernia: No hernia is present.  Skin:    General: Skin is warm and dry.     Findings: No rash.  Neurological:     Mental Status: She is alert.            Assessment & Plan:    See Problem List for Assessment and Plan of chronic medical problems.

## 2023-06-09 NOTE — Assessment & Plan Note (Signed)
 Subacute Has been experiencing left lower quadrant pain for a year that is intermittent Diffuse tenderness on exam, but especially in left lower quadrant Pain is worse when she is constipated or if she is having more frequent bowel movements Pain may be related to constipation versus adhesions, stricture and colon in addition to constipation-this is where she had surgery in the past for partial bowel obstruction Given duration of pain and tenderness on exam we will get CT abdomen and pelvis with contrast Referral to GI to help with both the left lower quadrant pain and constipation/bowel/IBS symptoms

## 2023-06-09 NOTE — Patient Instructions (Addendum)
      A CT scan was ordered.      Medications changes include :   None    A referral was ordered for GI and someone will call you to schedule an appointment.

## 2023-06-17 ENCOUNTER — Other Ambulatory Visit: Payer: Self-pay | Admitting: Internal Medicine

## 2023-06-25 ENCOUNTER — Other Ambulatory Visit: Payer: Self-pay | Admitting: Family

## 2023-06-25 DIAGNOSIS — R928 Other abnormal and inconclusive findings on diagnostic imaging of breast: Secondary | ICD-10-CM

## 2023-06-29 ENCOUNTER — Other Ambulatory Visit: Payer: Medicare Other

## 2023-07-07 DIAGNOSIS — H26492 Other secondary cataract, left eye: Secondary | ICD-10-CM | POA: Diagnosis not present

## 2023-07-09 ENCOUNTER — Other Ambulatory Visit: Payer: Self-pay | Admitting: Family Medicine

## 2023-07-09 ENCOUNTER — Ambulatory Visit: Payer: Medicare Other

## 2023-07-09 DIAGNOSIS — F419 Anxiety disorder, unspecified: Secondary | ICD-10-CM

## 2023-07-12 ENCOUNTER — Other Ambulatory Visit: Payer: Self-pay | Admitting: Internal Medicine

## 2023-07-12 ENCOUNTER — Other Ambulatory Visit: Payer: Self-pay | Admitting: Family Medicine

## 2023-07-12 ENCOUNTER — Encounter: Payer: Self-pay | Admitting: Internal Medicine

## 2023-07-12 DIAGNOSIS — F419 Anxiety disorder, unspecified: Secondary | ICD-10-CM

## 2023-07-12 MED ORDER — ALPRAZOLAM 0.5 MG PO TABS: ORAL_TABLET | ORAL | 0 refills | Status: DC

## 2023-07-19 DIAGNOSIS — H43392 Other vitreous opacities, left eye: Secondary | ICD-10-CM | POA: Diagnosis not present

## 2023-07-19 DIAGNOSIS — H353122 Nonexudative age-related macular degeneration, left eye, intermediate dry stage: Secondary | ICD-10-CM | POA: Diagnosis not present

## 2023-07-19 DIAGNOSIS — H35363 Drusen (degenerative) of macula, bilateral: Secondary | ICD-10-CM | POA: Diagnosis not present

## 2023-07-19 DIAGNOSIS — H353111 Nonexudative age-related macular degeneration, right eye, early dry stage: Secondary | ICD-10-CM | POA: Diagnosis not present

## 2023-07-20 ENCOUNTER — Ambulatory Visit
Admission: RE | Admit: 2023-07-20 | Discharge: 2023-07-20 | Disposition: A | Discharge status: Home / Self Care | Payer: Medicare Other | Source: Ambulatory Visit | Attending: Internal Medicine | Admitting: Internal Medicine

## 2023-07-20 DIAGNOSIS — R1032 Left lower quadrant pain: Secondary | ICD-10-CM

## 2023-07-20 MED ORDER — IOPAMIDOL (ISOVUE-300) INJECTION 61%
80.0000 mL | Freq: Once | INTRAVENOUS | Status: AC | PRN
  Administered 2023-07-20: 80 mL via INTRAVENOUS

## 2023-07-28 ENCOUNTER — Encounter: Payer: Self-pay | Admitting: Internal Medicine

## 2023-08-18 ENCOUNTER — Other Ambulatory Visit: Payer: Self-pay | Admitting: Internal Medicine

## 2023-08-31 ENCOUNTER — Encounter: Payer: Self-pay | Admitting: Physician Assistant

## 2023-08-31 ENCOUNTER — Ambulatory Visit: Payer: Medicare Other | Admitting: Physician Assistant

## 2023-08-31 VITALS — BP 110/78 | HR 109 | Ht 59.0 in | Wt 129.4 lb

## 2023-08-31 DIAGNOSIS — R110 Nausea: Secondary | ICD-10-CM

## 2023-08-31 DIAGNOSIS — K5900 Constipation, unspecified: Secondary | ICD-10-CM

## 2023-08-31 DIAGNOSIS — K581 Irritable bowel syndrome with constipation: Secondary | ICD-10-CM | POA: Diagnosis not present

## 2023-08-31 DIAGNOSIS — K219 Gastro-esophageal reflux disease without esophagitis: Secondary | ICD-10-CM | POA: Diagnosis not present

## 2023-08-31 DIAGNOSIS — K589 Irritable bowel syndrome without diarrhea: Secondary | ICD-10-CM

## 2023-08-31 NOTE — Patient Instructions (Signed)
 Victorino Dike suggests that you can try Pepcid 20-40 mg daily  _______________________________________________________  If your blood pressure at your visit was 140/90 or greater, please contact your primary care physician to follow up on this.  _______________________________________________________  If you are age 83 or older, your body mass index should be between 23-30. Your Body mass index is 26.14 kg/m. If this is out of the aforementioned range listed, please consider follow up with your Primary Care Provider.  If you are age 44 or younger, your body mass index should be between 19-25. Your Body mass index is 26.14 kg/m. If this is out of the aformentioned range listed, please consider follow up with your Primary Care Provider.   ________________________________________________________  The [facility] providers would like to encourage you to use [facility] to communicate with providers for non-urgent requests or questions.  Due to long hold times on the telephone, sending your provider a message by [facility] may be a faster and more efficient way to get a response.  Please allow 48 business hours for a response.  Please remember that this is for non-urgent requests.  _______________________________________________________  Thank you for trusting me with your gastrointestinal care!   Vicente Serene, PA

## 2023-08-31 NOTE — Progress Notes (Signed)
 Chief Complaint: Constipation, GERD and IBS  HPI:    Laura Barnes is an 83 year old female, known to Dr. Myrtie Neither, with a past medical history as listed below including anxiety, depression and multiple others, who was referred to me by Pincus Sanes, MD for a complaint of constipation, GERD and IBS.      11/16/2011 colonoscopy with a sessile polyp at the [facility] flexure and otherwise normal.  Pathology showed benign mucosa.  Repeat recommended in 10 years.    01/28/2017 office visit with Dr. Myrtie Neither for pharyngeal esophageal dysphagia.  At that time discussed she had previously followed Dr. Jarold Motto since 2014 and had years of chronic upper abdominal pain with intermittent nausea.  At 1 point gastroparesis was considered but gastric emptying study was normal.  At that point recommended EGD.    02/18/2017 EGD was normal, esophagus dilated.  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    03/29/2023 TSH and vitamin D normal.  CMP with a glucose of 100 and otherwise normal.  CBC normal.    07/26/2023 CTAP with contrast done for left lower quadrant pain with a moderate amount of residual fecal material throughout the colon.  Otherwise no acute findings.    Today, the patient tells me that her GI symptoms are really chronic and quite stable but her PCP wanted her to just check in with [place] since it had been in a while.  She has ways of managing most of her symptoms.  She has reflux and is on Omeprazole, if she takes this 40 mg twice a day then she gets constipated, so she really only uses it as needed and if she is having good bowel movements, in between she may have some breakthrough GI symptoms and has switched all of her meds over to mostly holistic remedies given that she has strong adverse reactions to any pharmaceuticals, so she will drink Ginger tea or other forms of Ginger.      Also has constipation issues, occasionally will have to use Dulcolax chews twice a day and after using these for 3 to  4 days will eventually have a bowel movement, has tried other things over-the-counter and prescription all of which she has a strong adverse reaction to.  Dulcolax chews seem to be the only thing that works for her eventually.  Tells me she tried MiraLAX and actually caused constipation tried Senokot and made her stomach hurt, smooth move tea excetra.    Also complains of nausea which has been worse over the past few weeks, currently using Zofran 4 mg as needed which does help with symptoms.    Patient is really uninterested in any further workup but just wanted to report and tell me that she wants to see [place] about a year to go over all of her symptoms.    Also has fibromyalgia and blames a lot of things on this.    Denies fever, chills or weight loss.  Past Medical History:  Diagnosis Date   ALLERGIC RHINITIS    Allergy    Anemia    ANXIETY    Arthritis    hands   Bronchitis    Chronic fatigue fibromyalgia syndrome    Complication of anesthesia    says one time waking up she couldn't breathe, felt like her throat closing up   DEPRESSION    Enthesopathy of hip region    Family history of anesthesia complication    sister with n/v   FOOT PAIN, BILATERAL  GERD    Hiatal hernia    HYPERLIPIDEMIA    Hypothyroidism    IBS    MIGRAINE HEADACHE    MIGRAINE, COMMON    NECK MASS    OSTEOPENIA    OSTEOPOROSIS    PLANTAR FASCIITIS    RASH-NONVESICULAR    SINUSITIS- ACUTE-NOS    SKIN LESION    Urticaria    VAGINITIS    VENOUS INSUFFICIENCY, CHRONIC     Past Surgical History:  Procedure Laterality Date   APPENDECTOMY     BREAST ENHANCEMENT SURGERY     BREAST IMPLANT REMOVAL     CHOLECYSTECTOMY     COLONOSCOPY     FOOT SURGERY  11/06/2016   LAPAROSCOPY N/A 01/14/2021   Procedure: LAPAROSCOPY DIAGNOSTIC, LYSIS OF ADHESIONS, laparoscopic assisted open small bowel resection.;  Surgeon: Karie Soda, MD;  Location: [facility];  Service: General;  Laterality: N/A;   MASTECTOMY      bilateral for severe bilateral fibrocystic disease   OOPHORECTOMY     s/p neck lump removal     SHOULDER SURGERY     SINUS ENDO W/FUSION Right 06/30/2013   Procedure: RIGHT ENDOSCOPIC SPHENOIDECTOMY WITH FUSION SCAN;  Surgeon: Osborn Coho, MD;  Location: [facility] OR;  Service: ENT;  Laterality: Right;   TONSILLECTOMY     VAGINAL HYSTERECTOMY      Current Outpatient Medications  Medication Sig Dispense Refill   acetaminophen (TYLENOL) 500 MG tablet Take 2 tablets (1,000 mg total) by mouth every 6 (six) hours as needed for mild pain, moderate pain, fever or headache. 30 tablet 0   ALPRAZolam (XANAX) 0.5 MG tablet TAKE 1 TABLET BY MOUTH THREE TIMES A DAY AS NEEDED FOR ANXIETY 90 tablet 0   Amino Acids (AMINO ACID PO) Take 1 capsule by mouth 2 (two) times daily.     Ascorbic Acid (VITAMIN C) 1000 MG tablet Take 1,000 mg by mouth 2 (two) times daily.     B Complex-Biotin-FA (B COMPLETE) TABS Take 1 tablet by mouth daily. B Complete 100mg      BLACK COHOSH PO Take by mouth as needed.     buPROPion (WELLBUTRIN XL) 300 MG 24 hr tablet TAKE 1 TABLET BY MOUTH EVERY DAY 90 tablet 1   Cholecalciferol (VITAMIN D3) 1000 units CAPS Take 1,000 Units by mouth daily.     Coenzyme Q10 (CO Q-10) 200 MG CAPS Take 1 capsule by mouth 2 (two) times daily.     dicyclomine (BENTYL) 10 MG capsule Take 1 capsule (10 mg total) by mouth 3 (three) times daily before meals. Take as needed for IBS flares 30 capsule 0   ECHINACEA PO Take 1 tablet by mouth 2 (two) times daily.     ELDERBERRY PO Take by mouth daily.     estradiol (ESTRACE) 0.5 MG tablet TAKE 1 TABLET BY MOUTH EVERY DAY 90 tablet 1   Green Tea, Camellia sinensis, POWD Take 1 capsule by mouth daily.     Homeopathic Products (ARNICA [place]) PLLT Take by mouth daily.     HYDROmorphone (DILAUDID) 2 MG tablet 2 mg every 4 (four) hours as needed for severe pain.     hydrOXYzine (VISTARIL) 25 MG capsule Take 1 capsule (25 mg total) by mouth every 8 (eight) hours as  needed for itching. 30 capsule 3   KRILL OIL PO Take by mouth daily.     LECITHIN PO Take 1 tablet by mouth daily.     levothyroxine (SYNTHROID) 75 MCG tablet TAKE 1 TABLET BY  MOUTH EVERY DAY IN THE MORNING 90 tablet 1   MAGNESIUM ASPARTATE PO Take 1 tablet by mouth 2 (two) times daily.     meclizine (ANTIVERT) 25 MG tablet Take 1 tablet (25 mg total) by mouth 3 (three) times daily as needed for dizziness. 60 tablet 5   Misc. Devices (ROLLATOR) MISC Use rollator for ambulation 1 each 0   Multiple Vitamins-Minerals (ZINC PO) Take by mouth daily.     omeprazole (PRILOSEC) 40 MG capsule Take 1 capsule (40 mg total) by mouth 2 (two) times daily before a meal. 180 capsule 3   ondansetron (ZOFRAN) 4 MG tablet TAKE 1 TABLET BY MOUTH EVERY 8 HOURS AS NEEDED FOR NAUSEA 90 tablet 0   ondansetron (ZOFRAN-ODT) 8 MG disintegrating tablet DISSOLVE 1 TABLET ON THE TONGUE EVERY 8 HOURS AS NEEDED FOR NAUSEA OR VOMITTING 90 tablet 0   POTASSIUM AMINOBENZOATE PO Take 1 tablet by mouth daily.     Propylene Glycol (SYSTANE COMPLETE OP) Apply to eye as needed.     TART CHERRY PO Take by mouth daily.     triamterene-hydrochlorothiazide (MAXZIDE-25) 37.5-25 MG tablet TAKE 1 TABLET BY MOUTH EVERY DAY 90 tablet 1   TURMERIC PO Take 1 tablet by mouth 2 (two) times daily.      vitamin E 400 UNIT capsule Take 400 Units by mouth daily.     No current facility-administered medications for this visit.    Allergies as of 08/31/2023 - Reviewed 07/20/2023  Allergen Reaction Noted   Prednisone Anaphylaxis 01/23/2007   Adhesive [tape] Other (See Comments) 05/21/2014   Amoxicillin-pot clavulanate Hives and Diarrhea 01/23/2007   Aspirin Other (See Comments) 01/23/2007   Erythromycin Diarrhea 01/23/2007   Levofloxacin Other (See Comments) 10/29/2011   Nsaids Other (See Comments) 07/19/2007   Statins Nausea Only and Other (See Comments) 06/21/2013   Sumatriptan Hives and Palpitations 01/23/2007   Lyrica [pregabalin]   08/28/2020   Methylphenidate hcl  01/23/2007   Mirtazapine  01/23/2007   Red yeast rice [cholestin] Other (See Comments) 07/26/2018   Shellfish allergy  01/07/2015   Soy allergy (obsolete)  04/23/2017   Azithromycin Itching and Nausea Only 05/11/2023   Doxycycline Diarrhea 01/23/2007   Hydrocodone Itching 04/02/2010   Hydrocodone-acetaminophen Itching 01/23/2007   Keflex [cephalexin] Itching 07/07/2021   Latex Other (See Comments) 06/21/2013   Oxycodone Itching 03/14/2020   Rofecoxib Nausea Only 01/23/2007   Sertraline hcl Nausea Only 01/23/2007   Sulfonamide derivatives Itching 01/23/2007    Family History  Problem Relation Age of Onset   Diabetes Mother    Heart disease Father    Diabetes Father    Diabetes Sister    Heart disease Maternal Aunt    Heart disease Maternal Uncle        x 2   Kidney disease Paternal Uncle        questionable   Asthma Maternal Grandmother    Breast cancer Maternal Grandmother    Heart disease Son    Healthy Son    Irritable bowel syndrome Son    Colon cancer Neg Hx    Colon polyps Neg Hx    Rectal cancer Neg Hx    Stomach cancer Neg Hx     Social History   Socioeconomic History   Marital status: Widowed    Spouse name: Not on file   Number of children: 2   Years of education: Not on file   Highest education level: Not on file  Occupational History   Occupation:  retired Audiological scientist  Tobacco Use   Smoking status: Never    Passive exposure: Never   Smokeless tobacco: Never  Vaping Use   Vaping status: Never Used  Substance and Sexual Activity   Alcohol use: No   Drug use: No   Sexual activity: Not Currently  Other Topics Concern   Not on file  Social History Narrative   Has 2 biological children and 1 step child   Social Drivers of Corporate investment banker Strain: Low Risk  (12/22/2021)   Overall Financial Resource Strain (CARDIA)    Difficulty of Paying Living Expenses: Not hard at all  Food Insecurity: Low Risk   (11/16/2022)   [facility]   Hunger Vital Sign    Worried About Running Out of Food in the Last Year: Never true    Ran Out of Food in the Last Year: Never true  Transportation Needs: Not on file (11/16/2022)  Physical Activity: Sufficiently Active (12/22/2021)   Exercise Vital Sign    Days of Exercise per Week: 5 days    Minutes of Exercise per Session: 30 min  Stress: No Stress Concern Present (12/22/2021)   [employer] of Occupational Health - Occupational Stress Questionnaire    Feeling of Stress : Not at all  Social Connections: Moderately Integrated (12/22/2021)   Social Connection and Isolation Panel [NHANES]    Frequency of Communication with Friends and Family: More than three times a week    Frequency of Social Gatherings with Friends and Family: More than three times a week    Attends Religious Services: More than 4 times per year    Active Member of [employer] or Organizations: Yes    Attends Banker Meetings: More than 4 times per year    Marital Status: Widowed  Intimate Partner Violence: Not At Risk (12/22/2021)   Humiliation, Afraid, Rape, and Kick questionnaire    Fear of Current or Ex-Partner: No    Emotionally Abused: No    Physically Abused: No    Sexually Abused: No    Review of Systems:    Constitutional: No weight loss, fever or chills Skin: No rash Cardiovascular: No chest pain  Respiratory: No SOB  Gastrointestinal: See HPI and otherwise negative Genitourinary: No dysuria  Neurological: No headache, dizziness or syncope Musculoskeletal: No new muscle or joint pain Hematologic: No bleeding  Psychiatric: No history of depression or anxiety   Physical Exam:  Vital signs: BP 110/78   Pulse (!) 109   Ht 4\' 11"  (1.499 m)   Wt 129 lb 6.4 oz (58.7 kg)   BMI 26.14 kg/m   Constitutional:   Pleasant elderly Caucasian female appears to be in NAD, Well developed, Well nourished, alert and cooperative Head:   Normocephalic and atraumatic. Eyes:   PEERL, EOMI. No icterus. Conjunctiva pink. Ears:  Normal auditory acuity. Neck:  Supple Throat: Oral cavity and pharynx without inflammation, swelling or lesion.  Respiratory: Respirations even and unlabored. Lungs clear to auscultation bilaterally.   No wheezes, crackles, or rhonchi.  Cardiovascular: Normal S1, S2. No MRG. Regular rate and rhythm. No peripheral edema, cyanosis or pallor.  Gastrointestinal:  Soft, nondistended, nontender. No rebound or guarding. Normal bowel sounds. No appreciable masses or hepatomegaly. Rectal:  Not performed.  Msk:  Symmetrical without gross deformities. Without edema, no deformity or joint abnormality.  Neurologic:  Alert and  oriented x4;  grossly normal neurologically.  Skin:   Dry and intact without significant lesions or  rashes. Psychiatric: Demonstrates good judgement and reason without abnormal affect or behaviors.  RELEVANT LABS AND IMAGING: CBC    Component Value Date/Time   WBC 12.4 (H) 03/29/2023 1534   RBC 4.10 03/29/2023 1534   HGB 13.4 03/29/2023 1534   HCT 40.5 03/29/2023 1534   PLT 275.0 03/29/2023 1534   MCV 98.9 03/29/2023 1534   MCH 33.7 01/22/2021 0723   MCHC 33.2 03/29/2023 1534   RDW 13.6 03/29/2023 1534   LYMPHSABS 2.3 03/29/2023 1534   MONOABS 1.0 03/29/2023 1534   EOSABS 0.1 03/29/2023 1534   BASOSABS 0.1 03/29/2023 1534    CMP     Component Value Date/Time   NA 139 03/29/2023 1534   K 3.7 03/29/2023 1534   CL 101 03/29/2023 1534   CO2 29 03/29/2023 1534   GLUCOSE 100 (H) 03/29/2023 1534   BUN 23 03/29/2023 1534   CREATININE 1.01 03/29/2023 1534   CALCIUM 10.4 03/29/2023 1534   PROT 7.8 03/29/2023 1534   ALBUMIN 4.4 03/29/2023 1534   AST 24 03/29/2023 1534   ALT 17 03/29/2023 1534   ALKPHOS 47 03/29/2023 1534   BILITOT 0.4 03/29/2023 1534   GFRNONAA >60 01/21/2021 0417   GFRAA >60 04/23/2017 0645    Assessment: 1.  Constipation: Controlled with Dulcolax chews as  needed 2.  GERD: Controlled on Omeprazole 40 mg as needed 3.  IBS: Controlled on as needed dicyclomine 4.  Nausea: Could be from certain some of her various holistic/herbal remedies versus gastritis, managed with as needed Zofran  Plan: 1.  All of patient's chronic GI issues are stable and she is happy with her current therapies.  She does not want any further workup at this time just wanted to let us know how she is doing. 2.  Recommend she maybe try Famotidine 20 mg as needed for breakthrough reflux.  She tells me she is going to look this up to see if it may cause any issues. 3.  Patient to follow in clinic in a year or sooner if necessary.  Hyacinth Meeker, PA-C [facility] 08/31/2023, 1:27 PM  Cc: Pincus Sanes, MD

## 2023-09-01 NOTE — Progress Notes (Signed)
 ____________________________________________________________  Attending physician addendum:  Thank you for sending this case to me. I have reviewed the entire note and agree with the plan.  Sounds like longstanding functional digestive symptoms and she has found a regimen that generally works for her.  Perhaps some of her naturopathic treatments might be helpful for her nausea as well (i.e. ginger, peppermint extract), since regular use of the Zofran may worsen her constipation.  Amada Jupiter, MD  ____________________________________________________________

## 2023-09-21 DIAGNOSIS — M9905 Segmental and somatic dysfunction of pelvic region: Secondary | ICD-10-CM | POA: Diagnosis not present

## 2023-09-21 DIAGNOSIS — M9904 Segmental and somatic dysfunction of sacral region: Secondary | ICD-10-CM | POA: Diagnosis not present

## 2023-09-21 DIAGNOSIS — M5136 Other intervertebral disc degeneration, lumbar region with discogenic back pain only: Secondary | ICD-10-CM | POA: Diagnosis not present

## 2023-09-21 DIAGNOSIS — M9903 Segmental and somatic dysfunction of lumbar region: Secondary | ICD-10-CM | POA: Diagnosis not present

## 2023-09-26 ENCOUNTER — Encounter: Payer: Self-pay | Admitting: Internal Medicine

## 2023-09-26 DIAGNOSIS — R739 Hyperglycemia, unspecified: Secondary | ICD-10-CM | POA: Insufficient documentation

## 2023-09-26 NOTE — Patient Instructions (Addendum)
      Blood work was ordered.       Medications changes include :   None    A referral was ordered and someone will call you to schedule an appointment.     Return in about 6 months (around 03/28/2024) for Physical Exam.

## 2023-09-26 NOTE — Progress Notes (Unsigned)
 Subjective:    Patient ID: Laura Barnes, female    DOB: [birth year], 83 y.o.   MRN: [id number]     HPI Laura Barnes is here for follow up of her chronic medical problems.  Everything hurts - it feels like her back is breaking in half.   She went to her chiropractor last week and it did help.  She sees him again today.   Has increased stress.  Frustrated with her family.    Medications and allergies reviewed with patient and updated if appropriate.  Current Outpatient Medications on File Prior to Visit  Medication Sig Dispense Refill   acetaminophen  (TYLENOL ) 500 MG tablet Take 2 tablets (1,000 mg total) by mouth every 6 (six) hours as needed for mild pain, moderate pain, fever or headache. 30 tablet 0   ALPRAZolam  (XANAX ) 0.5 MG tablet TAKE 1 TABLET BY MOUTH THREE TIMES A DAY AS NEEDED FOR ANXIETY 90 tablet 0   Amino Acids (AMINO ACID PO) Take 1 capsule by mouth 2 (two) times daily.     Ascorbic Acid (VITAMIN C) 1000 MG tablet Take 1,000 mg by mouth 2 (two) times daily.     B Complex-Biotin-FA (B COMPLETE) TABS Take 1 tablet by mouth daily. B Complete 100mg      BLACK COHOSH PO Take by mouth as needed.     buPROPion  (WELLBUTRIN  XL) 300 MG 24 hr tablet TAKE 1 TABLET BY MOUTH EVERY DAY 90 tablet 1   Cholecalciferol (VITAMIN D3) 1000 units CAPS Take 1,000 Units by mouth daily.     Coenzyme Q10 (CO Q-10) 200 MG CAPS Take 1 capsule by mouth 2 (two) times daily.     dicyclomine  (BENTYL ) 10 MG capsule Take 1 capsule (10 mg total) by mouth 3 (three) times daily before meals. Take as needed for IBS flares 30 capsule 0   ECHINACEA PO Take 1 tablet by mouth 2 (two) times daily.     ELDERBERRY PO Take by mouth daily.     estradiol  (ESTRACE ) 0.5 MG tablet TAKE 1 TABLET BY MOUTH EVERY DAY 90 tablet 1   Green Tea, Camellia sinensis, POWD Take 1 capsule by mouth daily.     Homeopathic Products (ARNICA [place] ) PLLT Take by mouth daily.     HYDROmorphone  (DILAUDID ) 2 MG tablet 2 mg every 4  (four) hours as needed for severe pain.     hydrOXYzine  (VISTARIL ) 25 MG capsule Take 1 capsule (25 mg total) by mouth every 8 (eight) hours as needed for itching. 30 capsule 3   KRILL OIL PO Take by mouth daily.     LECITHIN PO Take 1 tablet by mouth daily.     levothyroxine  (SYNTHROID ) 75 MCG tablet TAKE 1 TABLET BY MOUTH EVERY DAY IN THE MORNING 90 tablet 1   MAGNESIUM  ASPARTATE PO Take 1 tablet by mouth 2 (two) times daily.     meclizine  (ANTIVERT ) 25 MG tablet Take 1 tablet (25 mg total) by mouth 3 (three) times daily as needed for dizziness. 60 tablet 5   Misc. Devices (ROLLATOR) MISC Use rollator for ambulation 1 each 0   Multiple Vitamins-Minerals (ZINC PO) Take by mouth daily.     omeprazole  (PRILOSEC) 40 MG capsule Take 1 capsule (40 mg total) by mouth 2 (two) times daily before a meal. 180 capsule 3   ondansetron  (ZOFRAN ) 4 MG tablet TAKE 1 TABLET BY MOUTH EVERY 8 HOURS AS NEEDED FOR NAUSEA 90 tablet 0   ondansetron  (ZOFRAN -ODT) 8 MG disintegrating  tablet DISSOLVE 1 TABLET ON THE TONGUE EVERY 8 HOURS AS NEEDED FOR NAUSEA OR VOMITTING 90 tablet 0   POTASSIUM AMINOBENZOATE PO Take 1 tablet by mouth daily.     Propylene Glycol (SYSTANE COMPLETE OP) Apply to eye as needed.     TART CHERRY PO Take by mouth daily.     triamterene -hydrochlorothiazide  (MAXZIDE -25) 37.5-25 MG tablet TAKE 1 TABLET BY MOUTH EVERY DAY 90 tablet 1   TURMERIC PO Take 1 tablet by mouth 2 (two) times daily.      vitamin E 400 UNIT capsule Take 400 Units by mouth daily.     No current facility-administered medications on file prior to visit.     Review of Systems  Constitutional:  Negative for fever.  Respiratory:  Positive for cough (chronic x years - no change) and shortness of breath (only occ). Negative for wheezing.   Cardiovascular:  Positive for leg swelling (occ). Negative for chest pain and palpitations.  Gastrointestinal:  Positive for nausea.  Musculoskeletal:  Positive for back pain.  Neurological:   Positive for headaches (left temple/forehead region). Negative for light-headedness.       Objective:   Vitals:   09/27/23 1421  BP: 126/72  Pulse: 99  Temp: 98.2 F (36.8 C)  SpO2: 100%   BP Readings from Last 3 Encounters:  09/27/23 126/72  08/31/23 110/78  06/09/23 122/68   Wt Readings from Last 3 Encounters:  09/27/23 124 lb (56.2 kg)  08/31/23 129 lb 6.4 oz (58.7 kg)  06/09/23 125 lb (56.7 kg)   Body mass index is 25.04 kg/m.    Physical Exam Constitutional:      General: She is not in acute distress.    Appearance: Normal appearance.  HENT:     Head: Normocephalic and atraumatic.  Eyes:     Conjunctiva/sclera: Conjunctivae normal.  Cardiovascular:     Rate and Rhythm: Normal rate and regular rhythm.     Heart sounds: Normal heart sounds.  Pulmonary:     Effort: Pulmonary effort is normal. No respiratory distress.     Breath sounds: Normal breath sounds. No wheezing.  Musculoskeletal:     Cervical back: Neck supple.     Right lower leg: No edema.     Left lower leg: No edema.  Lymphadenopathy:     Cervical: No cervical adenopathy.  Skin:    General: Skin is warm and dry.     Findings: No rash.  Neurological:     Mental Status: She is alert. Mental status is at baseline.  Psychiatric:        Mood and Affect: Mood normal.        Behavior: Behavior normal.        Lab Results  Component Value Date   WBC 12.4 (H) 03/29/2023   HGB 13.4 03/29/2023   HCT 40.5 03/29/2023   PLT 275.0 03/29/2023   GLUCOSE 100 (H) 03/29/2023   CHOL 196 03/29/2023   TRIG 145.0 03/29/2023   HDL 80.40 03/29/2023   LDLDIRECT 119.6 04/06/2013   LDLCALC 87 03/29/2023   ALT 17 03/29/2023   AST 24 03/29/2023   NA 139 03/29/2023   K 3.7 03/29/2023   CL 101 03/29/2023   CREATININE 1.01 03/29/2023   BUN 23 03/29/2023   CO2 29 03/29/2023   TSH 0.41 03/29/2023   HGBA1C 5.3 01/11/2021     Assessment & Plan:    See Problem List for Assessment and Plan of chronic  medical problems.

## 2023-09-27 ENCOUNTER — Ambulatory Visit (INDEPENDENT_AMBULATORY_CARE_PROVIDER_SITE_OTHER): Payer: Medicare Other | Admitting: Internal Medicine

## 2023-09-27 ENCOUNTER — Encounter: Payer: Self-pay | Admitting: Internal Medicine

## 2023-09-27 VITALS — BP 126/72 | HR 99 | Temp 98.2°F | Ht 59.0 in | Wt 124.0 lb

## 2023-09-27 DIAGNOSIS — M9905 Segmental and somatic dysfunction of pelvic region: Secondary | ICD-10-CM | POA: Diagnosis not present

## 2023-09-27 DIAGNOSIS — M9903 Segmental and somatic dysfunction of lumbar region: Secondary | ICD-10-CM | POA: Diagnosis not present

## 2023-09-27 DIAGNOSIS — R110 Nausea: Secondary | ICD-10-CM

## 2023-09-27 DIAGNOSIS — F3289 Other specified depressive episodes: Secondary | ICD-10-CM

## 2023-09-27 DIAGNOSIS — M810 Age-related osteoporosis without current pathological fracture: Secondary | ICD-10-CM

## 2023-09-27 DIAGNOSIS — Z79890 Hormone replacement therapy (postmenopausal): Secondary | ICD-10-CM

## 2023-09-27 DIAGNOSIS — M5136 Other intervertebral disc degeneration, lumbar region with discogenic back pain only: Secondary | ICD-10-CM | POA: Diagnosis not present

## 2023-09-27 DIAGNOSIS — R600 Localized edema: Secondary | ICD-10-CM

## 2023-09-27 DIAGNOSIS — E039 Hypothyroidism, unspecified: Secondary | ICD-10-CM | POA: Diagnosis not present

## 2023-09-27 DIAGNOSIS — R739 Hyperglycemia, unspecified: Secondary | ICD-10-CM

## 2023-09-27 DIAGNOSIS — M9904 Segmental and somatic dysfunction of sacral region: Secondary | ICD-10-CM | POA: Diagnosis not present

## 2023-09-27 DIAGNOSIS — M797 Fibromyalgia: Secondary | ICD-10-CM | POA: Diagnosis not present

## 2023-09-27 DIAGNOSIS — F419 Anxiety disorder, unspecified: Secondary | ICD-10-CM

## 2023-09-27 MED ORDER — LIDOCAINE 5 % EX PTCH: 1.0000 | MEDICATED_PATCH | CUTANEOUS | 3 refills | Status: DC

## 2023-09-27 MED ORDER — ALPRAZOLAM 0.5 MG PO TABS: ORAL_TABLET | ORAL | 0 refills | Status: DC

## 2023-09-27 NOTE — Assessment & Plan Note (Signed)
 Chronic DEXA up to date Not on medication - deferred Taking calcium  and vitamin d  Will check vitamin d  level

## 2023-09-27 NOTE — Assessment & Plan Note (Signed)
 Chronic Lab Results  Component Value Date   HGBA1C 5.3 01/11/2021   Check a1c Low sugar / carb diet Stressed regular exercise

## 2023-09-27 NOTE — Assessment & Plan Note (Signed)
 Chronic Controlled, stable Continue welbutrin xl 300 mg daily, xanax  0.5 mg tid prn Occ takes an extra 100-150 xl of wellbutrin  from an old prescription which does help - ok to continue

## 2023-09-27 NOTE — Assessment & Plan Note (Signed)
Chronic Intermittent nausea Continue zofran prn - has two different doses for severity of nausea

## 2023-09-27 NOTE — Assessment & Plan Note (Signed)
 Chronic Controlled, stable Continue wellbutrin  xl 300 mg daily Occ takes an extra 100-150 xl of wellbutrin  from an old prescription which does help - ok to continue

## 2023-09-27 NOTE — Assessment & Plan Note (Signed)
Chronic ?Controlled, stable ?Continue triamterene-hct 37.5-25 mg daily ?

## 2023-09-27 NOTE — Assessment & Plan Note (Signed)
Chronic  Clinically euthyroid Continue taking levothyroxine 75 mcg Check tsh and will titrate medication dose as needed

## 2023-09-27 NOTE — Assessment & Plan Note (Addendum)
 Chronic Fairly stable Having a flare now Continue wellbutrin  xl 300 mg daily, estradiol  0.5 mg daily Tylenol  as needed Continue natural supplements To see her chiropractor today

## 2023-09-27 NOTE — Assessment & Plan Note (Signed)
 Chronic Continue estradiol  0.5 mg daily Discussed risks -- she feels benefits outweigh risks

## 2023-09-29 ENCOUNTER — Telehealth: Payer: Self-pay

## 2023-09-29 ENCOUNTER — Other Ambulatory Visit (INDEPENDENT_AMBULATORY_CARE_PROVIDER_SITE_OTHER)

## 2023-09-29 DIAGNOSIS — M810 Age-related osteoporosis without current pathological fracture: Secondary | ICD-10-CM | POA: Diagnosis not present

## 2023-09-29 DIAGNOSIS — E039 Hypothyroidism, unspecified: Secondary | ICD-10-CM

## 2023-09-29 DIAGNOSIS — M5136 Other intervertebral disc degeneration, lumbar region with discogenic back pain only: Secondary | ICD-10-CM | POA: Diagnosis not present

## 2023-09-29 DIAGNOSIS — M9905 Segmental and somatic dysfunction of pelvic region: Secondary | ICD-10-CM | POA: Diagnosis not present

## 2023-09-29 DIAGNOSIS — M9904 Segmental and somatic dysfunction of sacral region: Secondary | ICD-10-CM | POA: Diagnosis not present

## 2023-09-29 DIAGNOSIS — R739 Hyperglycemia, unspecified: Secondary | ICD-10-CM | POA: Diagnosis not present

## 2023-09-29 DIAGNOSIS — M9903 Segmental and somatic dysfunction of lumbar region: Secondary | ICD-10-CM | POA: Diagnosis not present

## 2023-09-29 LAB — HEMOGLOBIN A1C: Hgb A1c MFr Bld: 5.3 % (ref 4.6–6.5)

## 2023-09-29 LAB — CBC WITH DIFFERENTIAL/PLATELET
Basophils Absolute: 0 10*3/uL (ref 0.0–0.1)
Basophils Relative: 0.5 % (ref 0.0–3.0)
Eosinophils Absolute: 0.1 10*3/uL (ref 0.0–0.7)
Eosinophils Relative: 1.1 % (ref 0.0–5.0)
HCT: 40.8 % (ref 36.0–46.0)
Hemoglobin: 13.7 g/dL (ref 12.0–15.0)
Lymphocytes Relative: 25.5 % (ref 12.0–46.0)
Lymphs Abs: 2.3 10*3/uL (ref 0.7–4.0)
MCHC: 33.7 g/dL (ref 30.0–36.0)
MCV: 98.4 fl (ref 78.0–100.0)
Monocytes Absolute: 0.9 10*3/uL (ref 0.1–1.0)
Monocytes Relative: 9.6 % (ref 3.0–12.0)
Neutro Abs: 5.6 10*3/uL (ref 1.4–7.7)
Neutrophils Relative %: 63.3 % (ref 43.0–77.0)
Platelets: 242 10*3/uL (ref 150.0–400.0)
RBC: 4.15 Mil/uL (ref 3.87–5.11)
RDW: 13 % (ref 11.5–15.5)
WBC: 8.9 10*3/uL (ref 4.0–10.5)

## 2023-09-29 LAB — COMPREHENSIVE METABOLIC PANEL WITH GFR
ALT: 18 U/L (ref 0–35)
AST: 25 U/L (ref 0–37)
Albumin: 4.5 g/dL (ref 3.5–5.2)
Alkaline Phosphatase: 43 U/L (ref 39–117)
BUN: 17 mg/dL (ref 6–23)
CO2: 26 meq/L (ref 19–32)
Calcium: 10.1 mg/dL (ref 8.4–10.5)
Chloride: 100 meq/L (ref 96–112)
Creatinine, Ser: 0.88 mg/dL (ref 0.40–1.20)
GFR: 61.06 mL/min (ref >60.00)
Glucose, Bld: 106 mg/dL — ABNORMAL HIGH (ref 70–99)
Potassium: 4 meq/L (ref 3.5–5.1)
Sodium: 137 meq/L (ref 135–145)
Total Bilirubin: 0.5 mg/dL (ref 0.2–1.2)
Total Protein: 7.9 g/dL (ref 6.0–8.3)

## 2023-09-29 LAB — TSH: TSH: 0.9 u[IU]/mL (ref 0.35–5.50)

## 2023-09-29 LAB — VITAMIN D 25 HYDROXY (VIT D DEFICIENCY, FRACTURES): VITD: >120 ng/mL

## 2023-09-29 NOTE — Telephone Encounter (Signed)
 CRITICAL VALUE STICKER  CRITICAL VALUE: High Vitamin D  level greater than 120   RECEIVER (on-site recipient of call): Laura Barnes  DATE & TIME NOTIFIED: 4:58 pm 09/29/23  MESSENGER (representative from lab): Audi.Bidding  MD NOTIFIED: Yes  TIME OF NOTIFICATION: 5:01 pm

## 2023-09-29 NOTE — Telephone Encounter (Signed)
 noted

## 2023-09-30 ENCOUNTER — Encounter: Payer: Self-pay | Admitting: Internal Medicine

## 2023-10-01 ENCOUNTER — Other Ambulatory Visit: Payer: Self-pay | Admitting: Internal Medicine

## 2023-10-01 ENCOUNTER — Encounter: Payer: Self-pay | Admitting: Internal Medicine

## 2023-10-01 DIAGNOSIS — F419 Anxiety disorder, unspecified: Secondary | ICD-10-CM

## 2023-10-03 MED ORDER — ALPRAZOLAM 0.5 MG PO TABS: ORAL_TABLET | ORAL | 0 refills | Status: DC

## 2023-10-05 ENCOUNTER — Telehealth: Payer: Self-pay

## 2023-10-05 ENCOUNTER — Other Ambulatory Visit (HOSPITAL_COMMUNITY): Payer: Self-pay

## 2023-10-05 NOTE — Telephone Encounter (Signed)
 Pharmacy Patient Advocate Encounter   Received notification from Onbase that prior authorization for Lidocaine  5% patches is required/requested.   Insurance verification completed.   The patient is insured through [place] .   Per test claim: PA required; PA submitted to above mentioned insurance via CoverMyMeds Key/confirmation #/EOC [id number] Status is pending

## 2023-10-06 NOTE — Telephone Encounter (Signed)
 Pharmacy Patient Advocate Encounter  Received notification from [facility] that Prior Authorization for Lidocaine  5% patches has been DENIED.  See denial reason below. No denial letter attached in CMM. Will attach denial letter to Media tab once received.   PA #/Case ID/Reference #: [id number]

## 2023-10-14 DIAGNOSIS — M4316 Spondylolisthesis, lumbar region: Secondary | ICD-10-CM | POA: Diagnosis not present

## 2023-10-21 ENCOUNTER — Other Ambulatory Visit: Payer: Self-pay

## 2023-10-21 MED ORDER — ESTRADIOL 0.5 MG PO TABS: 0.5000 mg | ORAL_TABLET | Freq: Every day | ORAL | 1 refills | Status: DC

## 2023-10-21 MED ORDER — LEVOTHYROXINE SODIUM 75 MCG PO TABS: ORAL_TABLET | ORAL | 1 refills | Status: DC

## 2023-10-21 MED ORDER — BUPROPION HCL ER (XL) 300 MG PO TB24: 300.0000 mg | ORAL_TABLET | Freq: Every day | ORAL | 1 refills | Status: DC

## 2023-10-21 MED ORDER — ONDANSETRON HCL 4 MG PO TABS: 4.0000 mg | ORAL_TABLET | Freq: Three times a day (TID) | ORAL | 0 refills | Status: AC | PRN

## 2023-10-21 MED ORDER — TRIAMTERENE-HCTZ 37.5-25 MG PO TABS: 1.0000 | ORAL_TABLET | Freq: Every day | ORAL | 1 refills | Status: DC

## 2023-10-21 MED ORDER — ONDANSETRON 8 MG PO TBDP: ORAL_TABLET | ORAL | 0 refills | Status: DC

## 2023-10-21 NOTE — Addendum Note (Signed)
 Addended by: Colene Dauphin on: 10/21/2023 03:58 PM   Modules accepted: Orders

## 2023-10-27 ENCOUNTER — Other Ambulatory Visit: Payer: Self-pay | Admitting: Internal Medicine

## 2023-10-27 DIAGNOSIS — F419 Anxiety disorder, unspecified: Secondary | ICD-10-CM

## 2023-11-02 ENCOUNTER — Ambulatory Visit
Admission: RE | Admit: 2023-11-02 | Discharge: 2023-11-02 | Disposition: A | Discharge status: Home / Self Care | Source: Ambulatory Visit | Attending: Internal Medicine

## 2023-11-02 ENCOUNTER — Ambulatory Visit: Payer: Self-pay | Admitting: Internal Medicine

## 2023-11-02 DIAGNOSIS — N6331 Unspecified lump in axillary tail of the right breast: Secondary | ICD-10-CM | POA: Diagnosis not present

## 2023-11-02 DIAGNOSIS — M9903 Segmental and somatic dysfunction of lumbar region: Secondary | ICD-10-CM | POA: Diagnosis not present

## 2023-11-02 DIAGNOSIS — M51361 Other intervertebral disc degeneration, lumbar region with lower extremity pain only: Secondary | ICD-10-CM | POA: Diagnosis not present

## 2023-11-02 DIAGNOSIS — M9904 Segmental and somatic dysfunction of sacral region: Secondary | ICD-10-CM | POA: Diagnosis not present

## 2023-11-02 DIAGNOSIS — R2231 Localized swelling, mass and lump, right upper limb: Secondary | ICD-10-CM

## 2023-11-02 DIAGNOSIS — M9905 Segmental and somatic dysfunction of pelvic region: Secondary | ICD-10-CM | POA: Diagnosis not present

## 2023-11-03 DIAGNOSIS — H353132 Nonexudative age-related macular degeneration, bilateral, intermediate dry stage: Secondary | ICD-10-CM | POA: Diagnosis not present

## 2023-11-03 DIAGNOSIS — H26491 Other secondary cataract, right eye: Secondary | ICD-10-CM | POA: Diagnosis not present

## 2023-11-03 DIAGNOSIS — H35363 Drusen (degenerative) of macula, bilateral: Secondary | ICD-10-CM | POA: Diagnosis not present

## 2023-11-10 ENCOUNTER — Other Ambulatory Visit: Payer: Self-pay

## 2023-11-10 MED ORDER — ONDANSETRON 8 MG PO TBDP
ORAL_TABLET | ORAL | 0 refills | Status: AC
Start: 2023-11-10 — End: ?

## 2023-12-10 ENCOUNTER — Other Ambulatory Visit: Payer: Self-pay

## 2023-12-10 DIAGNOSIS — K219 Gastro-esophageal reflux disease without esophagitis: Secondary | ICD-10-CM

## 2023-12-10 MED ORDER — OMEPRAZOLE 40 MG PO CPDR: 40.0000 mg | DELAYED_RELEASE_CAPSULE | Freq: Two times a day (BID) | ORAL | 3 refills | Status: AC

## 2023-12-20 ENCOUNTER — Encounter: Payer: Self-pay | Admitting: Internal Medicine

## 2023-12-20 ENCOUNTER — Ambulatory Visit (INDEPENDENT_AMBULATORY_CARE_PROVIDER_SITE_OTHER): Admitting: Internal Medicine

## 2023-12-20 VITALS — BP 126/80 | HR 93 | Temp 98.0°F | Ht 59.0 in | Wt 124.0 lb

## 2023-12-20 DIAGNOSIS — F419 Anxiety disorder, unspecified: Secondary | ICD-10-CM | POA: Diagnosis not present

## 2023-12-20 DIAGNOSIS — M2550 Pain in unspecified joint: Secondary | ICD-10-CM | POA: Insufficient documentation

## 2023-12-20 DIAGNOSIS — R519 Headache, unspecified: Secondary | ICD-10-CM | POA: Diagnosis not present

## 2023-12-20 DIAGNOSIS — T7840XA Allergy, unspecified, initial encounter: Secondary | ICD-10-CM

## 2023-12-20 DIAGNOSIS — R239 Unspecified skin changes: Secondary | ICD-10-CM | POA: Insufficient documentation

## 2023-12-20 MED ORDER — ALPRAZOLAM 0.5 MG PO TABS: 0.5000 mg | ORAL_TABLET | Freq: Three times a day (TID) | ORAL | 5 refills | Status: AC | PRN

## 2023-12-20 NOTE — Assessment & Plan Note (Signed)
 Chronic In several areas Increased pain in left index finger, pain in left elbow/forearm Will f/u with dr deveshwar Likely OA

## 2023-12-20 NOTE — Assessment & Plan Note (Signed)
 Acute Rash, burning sensation and itching of face and itching all over Likely allergic reaction, ? Related to fibro Resolved Advised to have benadryl  on hand and use if needed She will let me know is she has additional episodes

## 2023-12-20 NOTE — Assessment & Plan Note (Signed)
 Started 2-3 weeks ago ? Migraine - has hx of migraines ? Allergies Intermittent pounding, head feels sensitive to touch Has several allergies and does not want treatment Can try benadryl  which she does tolerate

## 2023-12-20 NOTE — Assessment & Plan Note (Signed)
 Has new scaly patches in different parts of her body and areas of discoloration Monitor  - advised to see dermatology if not resolving

## 2023-12-20 NOTE — Assessment & Plan Note (Signed)
 Chronic Controlled, stable Continue welbutrin xl 300 mg daily, xanax  0.5 mg tid prn Occ takes an extra 100-150 xl of wellbutrin  from an old prescription which does help - ok to continue

## 2023-12-20 NOTE — Patient Instructions (Addendum)
      Try benadryl  for your headache.     Follow up with Dr Dolphus for your joint pain.      Medications changes include :   None

## 2023-12-20 NOTE — Progress Notes (Signed)
 Subjective:    Patient ID: Laura Barnes, female    DOB: [birth year], 83 y.o.   MRN: [id number]      HPI Laura Barnes is here for  Chief Complaint  Patient presents with   Allergic Reaction    Patient had rash that appeared as a sun-burn (has cleared up); Skin felt like it was on fire   Headache    Pounding headache that made her feel like she was about to lose her balance    Her head feels sore - whole head- she had it when she was washing her hair.  Her headaches is pounding and feels like it is in the back of her brain.  This started 2-3 weeks ago.  She has a history of migraines and thinks it may be a migraine.  She does not want any treatment.    Allergic reaction - had rash on face - it started after she put cream on her face felt like it was burning.  She washed her face and put on coconut oil.   Her whole body itched and felt like she had a mild sunburn  Rash went away.  Did not use anything different or ate anything different.    Getting rough scaly spots in different areas.  Some areas of discoloration.  Medications and allergies reviewed with patient and updated if appropriate.  Current Outpatient Medications on File Prior to Visit  Medication Sig Dispense Refill   acetaminophen  (TYLENOL ) 500 MG tablet Take 2 tablets (1,000 mg total) by mouth every 6 (six) hours as needed for mild pain, moderate pain, fever or headache. 30 tablet 0   ALPRAZolam  (XANAX ) 0.5 MG tablet TAKE 1 TABLET BY MOUTH 3 TIMES  DAILY AS NEEDED FOR ANXIETY 90 tablet 5   Amino Acids (AMINO ACID PO) Take 1 capsule by mouth 2 (two) times daily.     Ascorbic Acid (VITAMIN C) 1000 MG tablet Take 1,000 mg by mouth 2 (two) times daily.     B Complex-Biotin-FA (B COMPLETE) TABS Take 1 tablet by mouth daily. B Complete 100mg      BLACK COHOSH PO Take by mouth as needed.     buPROPion  (WELLBUTRIN  XL) 300 MG 24 hr tablet Take 1 tablet (300 mg total) by mouth daily. 90 tablet 1   Cholecalciferol (VITAMIN D3)  1000 units CAPS Take 1,000 Units by mouth daily.     Coenzyme Q10 (CO Q-10) 200 MG CAPS Take 1 capsule by mouth 2 (two) times daily.     dicyclomine  (BENTYL ) 10 MG capsule Take 1 capsule (10 mg total) by mouth 3 (three) times daily before meals. Take as needed for IBS flares 30 capsule 0   ECHINACEA PO Take 1 tablet by mouth 2 (two) times daily.     ELDERBERRY PO Take by mouth daily.     estradiol  (ESTRACE ) 0.5 MG tablet Take 1 tablet (0.5 mg total) by mouth daily. 90 tablet 1   Green Tea, Camellia sinensis, POWD Take 1 capsule by mouth daily.     Homeopathic Products (ARNICA [place] ) PLLT Take by mouth daily.     HYDROmorphone  (DILAUDID ) 2 MG tablet 2 mg every 4 (four) hours as needed for severe pain.     hydrOXYzine  (VISTARIL ) 25 MG capsule Take 1 capsule (25 mg total) by mouth every 8 (eight) hours as needed for itching. 30 capsule 3   KRILL OIL PO Take by mouth daily.     LECITHIN PO Take 1 tablet by mouth  daily.     levothyroxine  (SYNTHROID ) 75 MCG tablet TAKE 1 TABLET BY MOUTH EVERY DAY IN THE MORNING 90 tablet 1   lidocaine  (LIDODERM ) 5 % Place 1 patch onto the skin daily. Remove & Discard patch within 12 hours or as directed by MD 90 patch 3   MAGNESIUM  ASPARTATE PO Take 1 tablet by mouth 2 (two) times daily.     meclizine  (ANTIVERT ) 25 MG tablet Take 1 tablet (25 mg total) by mouth 3 (three) times daily as needed for dizziness. 60 tablet 5   Misc. Devices (ROLLATOR) MISC Use rollator for ambulation 1 each 0   Multiple Vitamins-Minerals (ZINC PO) Take by mouth daily.     omeprazole  (PRILOSEC) 40 MG capsule Take 1 capsule (40 mg total) by mouth 2 (two) times daily before a meal. 180 capsule 3   ondansetron  (ZOFRAN ) 4 MG tablet Take 1 tablet (4 mg total) by mouth every 8 (eight) hours as needed. for nausea 90 tablet 0   ondansetron  (ZOFRAN -ODT) 8 MG disintegrating tablet DISSOLVE 1 TABLET ON THE TONGUE EVERY 8 HOURS AS NEEDED FOR NAUSEA OR VOMITTING 90 tablet 0   POTASSIUM AMINOBENZOATE PO  Take 1 tablet by mouth daily.     Propylene Glycol (SYSTANE COMPLETE OP) Apply to eye as needed.     TART CHERRY PO Take by mouth daily.     triamterene -hydrochlorothiazide  (MAXZIDE -25) 37.5-25 MG tablet Take 1 tablet by mouth daily. 90 tablet 1   TURMERIC PO Take 1 tablet by mouth 2 (two) times daily.      vitamin E 400 UNIT capsule Take 400 Units by mouth daily.     No current facility-administered medications on file prior to visit.    Review of Systems  Constitutional:  Negative for fever.  Musculoskeletal:        Pain in left forearm  Skin:  Negative for rash.       Areas of scaly patches  Neurological:  Positive for headaches.       Objective:   Vitals:   12/20/23 1625  BP: 126/80  Pulse: 93  Temp: 98 F (36.7 C)  SpO2: 98%   BP Readings from Last 3 Encounters:  12/20/23 126/80  09/27/23 126/72  08/31/23 110/78   Wt Readings from Last 3 Encounters:  12/20/23 124 lb (56.2 kg)  09/27/23 124 lb (56.2 kg)  08/31/23 129 lb 6.4 oz (58.7 kg)   Body mass index is 25.04 kg/m.    Physical Exam Constitutional:      Appearance: Normal appearance.  Skin:    General: Skin is warm and dry.     Findings: Bruising (left anterior lower leg) present. No rash.     Comments: Areas of dry scaly areas in different parts of her body  Neurological:     General: No focal deficit present.     Mental Status: She is alert and oriented to person, place, and time. Mental status is at baseline.  Psychiatric:        Mood and Affect: Mood normal.            Assessment & Plan:    See Problem List for Assessment and Plan of chronic medical problems.

## 2023-12-21 ENCOUNTER — Telehealth: Payer: Self-pay

## 2023-12-21 NOTE — Telephone Encounter (Signed)
 Spoke with Corean and list faxed to [phone]

## 2023-12-21 NOTE — Telephone Encounter (Signed)
 Send medication allergy  list - I do not think she is allergic to clindamycin  as far as I know.

## 2023-12-29 ENCOUNTER — Other Ambulatory Visit: Payer: Self-pay | Admitting: Internal Medicine

## 2024-01-12 NOTE — Progress Notes (Deleted)
 Office Visit Note  Patient: Laura Barnes             Date of Birth: [birth year]           MRN: [id number]             PCP: Geofm Glade PARAS, MD Referring: Geofm Glade PARAS, MD Visit Date: 01/26/2024 Occupation: @GUAROCC @  Subjective:  No chief complaint on file.   History of Present Illness: Laura Barnes is a 83 y.o. female ***     Activities of Daily Living:  Patient reports morning stiffness for *** {minute/hour:19697}.   Patient {ACTIONS;DENIES/REPORTS:21021675::Denies} nocturnal pain.  Difficulty dressing/grooming: {ACTIONS;DENIES/REPORTS:21021675::Denies} Difficulty climbing stairs: {ACTIONS;DENIES/REPORTS:21021675::Denies} Difficulty getting out of chair: {ACTIONS;DENIES/REPORTS:21021675::Denies} Difficulty using hands for taps, buttons, cutlery, and/or writing: {ACTIONS;DENIES/REPORTS:21021675::Denies}  No Rheumatology ROS completed.   PMFS History:  Patient Active Problem List   Diagnosis Date Noted  . Acute headache 12/20/2023  . Unspecified skin changes 12/20/2023  . Joint pain 12/20/2023  . Hyperglycemia 09/26/2023  . LLQ pain 06/09/2023  . Chronic constipation 06/09/2023  . Tenderness of right axilla 10/09/2022  . Breast pain, left 10/20/2021  . Parotid gland pain 07/15/2021  . 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 Coronary artery calcification 03/31/2021  . CAD (coronary artery disease), mild 01/29/2021  . Small bowel strangulation s/p jejunal resection 8/16/202 - for SBO 01/15/2021  . Adverse effect of other drugs, medicaments and biological substances, subsequent encounter 12/24/2020  . Other allergic rhinitis 12/24/2020  . Allergic reaction 10/18/2020  . Aortic atherosclerosis (HCC) 03/29/2020  . 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 Scoliosis of thoracolumbar spine 07/11/2019  . Choking 03/24/2019  . Fatigue 12/31/2018  . Epistaxis, recurrent 08/08/2018  . Degenerative arthritis of knee, bilateral  07/05/2018  . Protrusion of lumbar intervertebral disc 04/20/2018  . Dizziness 08/11/2017  . Sacroiliac pain 06/17/2017  . Greater trochanteric bursitis of right hip 05/27/2017  . Vertigo 04/29/2017  . Chronic headaches 12/31/2016  . Dysphagia 10/28/2016  . 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 Chronic meniscal tear of knee 03/05/2016  . Nausea 03/04/2016  . Osteopenia 12/03/2015  . Thyroid  nodule 11/16/2015  . Bilateral leg edema 11/05/2015  . Neck pain on left side 11/05/2015  . Hormone replacement therapy (HRT) 11/05/2015  . Subacromial bursitis 09/16/2015  . 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 Trochanteric bursitis of both hips 03/12/2015  . Subacromial impingement of left shoulder 03/12/2015  . Hyperlipidemia 05/01/2014  . Lumbar radiculopathy 03/27/2014  . Left shoulder pain 11/07/2013  . Degeneration of intervertebral disc of cervical region 10/10/2013  . Fibromyalgia 09/06/2013  . Bilateral shoulder pain 09/06/2013  . Urine frequency 08/12/2013  . Chronic cough 04/12/2013  . Sinusitis, chronic 04/12/2013  . Hypothyroidism 04/07/2012  . Chronic venous insufficiency 04/02/2010  . Enthesopathy of hip region 07/19/2007  . Osteoporosis 07/19/2007  . Anxiety 01/23/2007  . Depression 01/23/2007  . Migraine 01/23/2007  . NINAR (noninfectious nonallergic rhinitis) 01/23/2007  . Gastroesophageal reflux disease 01/23/2007  . IBS 01/23/2007    Past Medical History:  Diagnosis Date  . ALLERGIC RHINITIS   . Allergy    . Anemia   . ANXIETY   . Arthritis    hands  . Bronchitis   . Chronic fatigue fibromyalgia syndrome   . Complication of anesthesia    says one time waking up she couldn't breathe, felt like her throat closing up  . DEPRESSION   . Enthesopathy of hip region   .  Family history of anesthesia complication    sister with n/v  . FOOT PAIN, BILATERAL   . GERD   . Hiatal hernia   . HYPERLIPIDEMIA   . Hypothyroidism   .  IBS   . MIGRAINE HEADACHE   . MIGRAINE, COMMON   . NECK MASS   . OSTEOPENIA   . OSTEOPOROSIS   . PLANTAR FASCIITIS   . RASH-NONVESICULAR   . SINUSITIS- ACUTE-NOS   . SKIN LESION   . Urticaria   . VAGINITIS   . VENOUS INSUFFICIENCY, CHRONIC     Family History  Problem Relation Age of Onset  . Diabetes Mother   . Heart disease Father   . Diabetes Father   . Diabetes Sister   . Heart disease Maternal Aunt   . Heart disease Maternal Uncle        x 2  . Kidney disease Paternal Uncle        questionable  . Asthma Maternal Grandmother   . Breast cancer Maternal Grandmother   . Heart disease Son   . Healthy Son   . Irritable bowel syndrome Son   . Colon cancer Neg Hx   . Colon polyps Neg Hx   . Rectal cancer Neg Hx   . Stomach cancer Neg Hx    Past Surgical History:  Procedure Laterality Date  . APPENDECTOMY    . BREAST ENHANCEMENT SURGERY    . BREAST IMPLANT REMOVAL    . CHOLECYSTECTOMY    . COLONOSCOPY    . FOOT SURGERY  11/06/2016  . LAPAROSCOPY N/A 01/14/2021   Procedure: LAPAROSCOPY DIAGNOSTIC, LYSIS OF ADHESIONS, laparoscopic assisted open small bowel resection.;  Surgeon: Sheldon Standing, MD;  Location: [facility];  Service: General;  Laterality: N/A;  . MASTECTOMY     bilateral for severe bilateral fibrocystic disease  . OOPHORECTOMY    . s/p neck lump removal    . SHOULDER SURGERY    . SINUS ENDO W/FUSION Right 06/30/2013   Procedure: RIGHT ENDOSCOPIC SPHENOIDECTOMY WITH FUSION SCAN;  Surgeon: Alm Bouche, MD;  Location: [facility] OR;  Service: ENT;  Laterality: Right;  . TONSILLECTOMY    . VAGINAL HYSTERECTOMY     Social History   Social History Narrative   Has 2 biological children and 1 step child   Immunization History  Administered Date(s) Administered  . Fluad Quad(high Dose 65+) 01/18/2019, 03/29/2020, 03/31/2021, 03/11/2022  . Influenza Split 04/07/2011, 03/08/2012  . Influenza Whole 03/20/2008, 04/01/2009, 04/02/2010  . Influenza, High Dose Seasonal PF  04/12/2013, 03/26/2015, 03/04/2016, 04/01/2017  . Influenza,inj,Quad PF,6+ Mos 05/01/2014  . Influenza-Unspecified 04/01/2018  . PNEUMOCOCCAL CONJUGATE-20 03/31/2021  . Pneumococcal Conjugate-13 05/02/2013  . Pneumococcal Polysaccharide-23 01/25/2006  . Td 09/17/2008  . Tdap 03/24/2019  . Zoster Recombinant(Shingrix) 08/18/2017, 12/06/2017  . Zoster, Live 01/25/2006     Objective: Vital Signs: There were no vitals taken for this visit.   Physical Exam   Musculoskeletal Exam: ***  CDAI Exam: CDAI Score: -- Patient Global: --; Provider Global: -- Swollen: --; Tender: -- Joint Exam 01/26/2024   No joint exam has been documented for this visit   There is currently no information documented on the homunculus. Go to the Rheumatology activity and complete the homunculus joint exam.  Investigation: No additional findings.  Imaging: No results found.  Recent Labs: Lab Results  Component Value Date   WBC 8.9 09/29/2023   HGB 13.7 09/29/2023   PLT 242.0 09/29/2023   NA 137 09/29/2023   K 4.0 09/29/2023  CL 100 09/29/2023   CO2 26 09/29/2023   GLUCOSE 106 (H) 09/29/2023   BUN 17 09/29/2023   CREATININE 0.88 09/29/2023   BILITOT 0.5 09/29/2023   ALKPHOS 43 09/29/2023   AST 25 09/29/2023   ALT 18 09/29/2023   PROT 7.9 09/29/2023   ALBUMIN  4.5 09/29/2023   CALCIUM  10.1 09/29/2023   GFRAA >60 04/23/2017    Speciality Comments: No specialty comments available.  Procedures:  No procedures performed Allergies: Prednisone , Adhesive [tape], Amoxicillin-pot clavulanate, Aspirin, Erythromycin, Levofloxacin , Nsaids, Statins, Sumatriptan, Codeine , Lyrica [pregabalin], Methylphenidate hcl, Mirtazapine, Red yeast rice [cholestin], Shellfish allergy , Soy allergy  (obsolete), Azithromycin , Doxycycline, Hydrocodone , Hydrocodone -acetaminophen , Keflex  [cephalexin ], Latex, Oxycodone, Rofecoxib, Sertraline hcl, and Sulfonamide derivatives   Assessment / Plan:     Visit Diagnoses: No  diagnosis found.  Orders: No orders of the defined types were placed in this encounter.  No orders of the defined types were placed in this encounter.   Face-to-face time spent with patient was *** minutes. Greater than 50% of time was spent in counseling and coordination of care.  Follow-Up Instructions: No follow-ups on file.   Daved JAYSON Gavel, CMA  Note - This record has been created using Animal nutritionist.  Chart creation errors have been sought, but may not always  have been located. Such creation errors do not reflect on  the standard of medical care.

## 2024-01-26 ENCOUNTER — Ambulatory Visit: Payer: Medicare Other | Admitting: Rheumatology

## 2024-01-26 DIAGNOSIS — E041 Nontoxic single thyroid nodule: Secondary | ICD-10-CM

## 2024-01-26 DIAGNOSIS — F32A Depression, unspecified: Secondary | ICD-10-CM

## 2024-01-26 DIAGNOSIS — M19042 Primary osteoarthritis, left hand: Secondary | ICD-10-CM

## 2024-01-26 DIAGNOSIS — F419 Anxiety disorder, unspecified: Secondary | ICD-10-CM

## 2024-01-26 DIAGNOSIS — Z8639 Personal history of other endocrine, nutritional and metabolic disease: Secondary | ICD-10-CM

## 2024-01-26 DIAGNOSIS — G5702 Lesion of sciatic nerve, left lower limb: Secondary | ICD-10-CM

## 2024-01-26 DIAGNOSIS — G894 Chronic pain syndrome: Secondary | ICD-10-CM

## 2024-01-26 DIAGNOSIS — M5030 Other cervical disc degeneration, unspecified cervical region: Secondary | ICD-10-CM

## 2024-01-26 DIAGNOSIS — E039 Hypothyroidism, unspecified: Secondary | ICD-10-CM

## 2024-01-26 DIAGNOSIS — M8589 Other specified disorders of bone density and structure, multiple sites: Secondary | ICD-10-CM

## 2024-01-26 DIAGNOSIS — M19041 Primary osteoarthritis, right hand: Secondary | ICD-10-CM

## 2024-01-26 DIAGNOSIS — M7542 Impingement syndrome of left shoulder: Secondary | ICD-10-CM

## 2024-01-26 DIAGNOSIS — M797 Fibromyalgia: Secondary | ICD-10-CM

## 2024-01-26 DIAGNOSIS — M47816 Spondylosis without myelopathy or radiculopathy, lumbar region: Secondary | ICD-10-CM

## 2024-01-26 DIAGNOSIS — Z8719 Personal history of other diseases of the digestive system: Secondary | ICD-10-CM

## 2024-01-26 DIAGNOSIS — M7061 Trochanteric bursitis, right hip: Secondary | ICD-10-CM

## 2024-01-26 DIAGNOSIS — Z79890 Hormone replacement therapy (postmenopausal): Secondary | ICD-10-CM

## 2024-01-26 DIAGNOSIS — I872 Venous insufficiency (chronic) (peripheral): Secondary | ICD-10-CM

## 2024-02-03 ENCOUNTER — Ambulatory Visit: Admitting: Rheumatology
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
## 2024-02-16 ENCOUNTER — Encounter: Payer: Self-pay | Admitting: Rheumatology

## 2024-02-16 ENCOUNTER — Ambulatory Visit: Attending: Rheumatology | Admitting: Rheumatology

## 2024-02-16 VITALS — BP 149/87 | HR 96 | Temp 98.4°F | Resp 14 | Ht 59.0 in | Wt 123.6 lb

## 2024-02-16 DIAGNOSIS — Z8719 Personal history of other diseases of the digestive system: Secondary | ICD-10-CM | POA: Diagnosis not present

## 2024-02-16 DIAGNOSIS — Z79890 Hormone replacement therapy (postmenopausal): Secondary | ICD-10-CM

## 2024-02-16 DIAGNOSIS — M7062 Trochanteric bursitis, left hip: Secondary | ICD-10-CM

## 2024-02-16 DIAGNOSIS — I872 Venous insufficiency (chronic) (peripheral): Secondary | ICD-10-CM

## 2024-02-16 DIAGNOSIS — G5702 Lesion of sciatic nerve, left lower limb: Secondary | ICD-10-CM | POA: Diagnosis not present

## 2024-02-16 DIAGNOSIS — M810 Age-related osteoporosis without current pathological fracture: Secondary | ICD-10-CM | POA: Diagnosis not present

## 2024-02-16 DIAGNOSIS — M47816 Spondylosis without myelopathy or radiculopathy, lumbar region: Secondary | ICD-10-CM

## 2024-02-16 DIAGNOSIS — M797 Fibromyalgia: Secondary | ICD-10-CM

## 2024-02-16 DIAGNOSIS — M7061 Trochanteric bursitis, right hip: Secondary | ICD-10-CM | POA: Diagnosis not present

## 2024-02-16 DIAGNOSIS — M19041 Primary osteoarthritis, right hand: Secondary | ICD-10-CM | POA: Diagnosis not present

## 2024-02-16 DIAGNOSIS — M7542 Impingement syndrome of left shoulder: Secondary | ICD-10-CM

## 2024-02-16 DIAGNOSIS — G894 Chronic pain syndrome: Secondary | ICD-10-CM

## 2024-02-16 DIAGNOSIS — F32A Depression, unspecified: Secondary | ICD-10-CM

## 2024-02-16 DIAGNOSIS — M5030 Other cervical disc degeneration, unspecified cervical region: Secondary | ICD-10-CM | POA: Diagnosis not present

## 2024-02-16 DIAGNOSIS — M8589 Other specified disorders of bone density and structure, multiple sites: Secondary | ICD-10-CM

## 2024-02-16 DIAGNOSIS — E041 Nontoxic single thyroid nodule: Secondary | ICD-10-CM | POA: Diagnosis not present

## 2024-02-16 DIAGNOSIS — M19042 Primary osteoarthritis, left hand: Secondary | ICD-10-CM

## 2024-02-16 DIAGNOSIS — F419 Anxiety disorder, unspecified: Secondary | ICD-10-CM

## 2024-02-16 DIAGNOSIS — E039 Hypothyroidism, unspecified: Secondary | ICD-10-CM

## 2024-02-16 DIAGNOSIS — Z8639 Personal history of other endocrine, nutritional and metabolic disease: Secondary | ICD-10-CM

## 2024-02-16 NOTE — Patient Instructions (Signed)

## 2024-02-19 ENCOUNTER — Other Ambulatory Visit: Payer: Self-pay | Admitting: Internal Medicine

## 2024-02-23 DIAGNOSIS — M9904 Segmental and somatic dysfunction of sacral region: Secondary | ICD-10-CM | POA: Diagnosis not present

## 2024-02-23 DIAGNOSIS — M9905 Segmental and somatic dysfunction of pelvic region: Secondary | ICD-10-CM | POA: Diagnosis not present

## 2024-02-23 DIAGNOSIS — M9903 Segmental and somatic dysfunction of lumbar region: Secondary | ICD-10-CM | POA: Diagnosis not present

## 2024-02-23 DIAGNOSIS — M51361 Other intervertebral disc degeneration, lumbar region with lower extremity pain only: Secondary | ICD-10-CM | POA: Diagnosis not present

## 2024-03-13 ENCOUNTER — Telehealth: Payer: Self-pay

## 2024-03-13 ENCOUNTER — Other Ambulatory Visit (HOSPITAL_COMMUNITY): Payer: Self-pay

## 2024-03-13 NOTE — Telephone Encounter (Signed)
 Pharmacy Patient Advocate Encounter   Received notification from CoverMyMeds that prior authorization for Lidocaine  5% patches is required/requested.   Insurance verification completed.   The patient is insured through [facility].   Per test claim: PA required; PA submitted to above mentioned insurance via Latent Key/confirmation #/EOC [id number] Status is pending

## 2024-03-13 NOTE — Telephone Encounter (Signed)
 Pharmacy Patient Advocate Encounter  Received notification from [employer] that Prior Authorization for Lidocaine  5% patches has been DENIED.  See denial reason below. No denial letter attached in CMM. Will attach denial letter to Media tab once received.   PA #/Case ID/Reference #: [id number]

## 2024-03-13 NOTE — Telephone Encounter (Signed)
 She may need to purchase the 4% patches over-the-counter.

## 2024-04-11 ENCOUNTER — Other Ambulatory Visit: Payer: Self-pay | Admitting: Internal Medicine

## 2024-04-18 ENCOUNTER — Encounter: Payer: Self-pay | Admitting: Internal Medicine

## 2024-04-24 ENCOUNTER — Other Ambulatory Visit: Payer: Self-pay

## 2024-04-24 MED ORDER — LIDOCAINE 5 % EX PTCH: 1.0000 | MEDICATED_PATCH | CUTANEOUS | 3 refills | Status: AC

## 2024-05-08 NOTE — Progress Notes (Unsigned)
 Subjective:    Patient ID: Laura Barnes, female    DOB: 01/19/1941, 83 y.o.   MRN: [id number]      HPI Karmin is here for No chief complaint on file.   Skin issues -      Medications and allergies reviewed with patient and updated if appropriate.  Current Outpatient Medications on File Prior to Visit  Medication Sig Dispense Refill   acetaminophen  (TYLENOL ) 500 MG tablet Take 2 tablets (1,000 mg total) by mouth every 6 (six) hours as needed for mild pain, moderate pain, fever or headache. 30 tablet 0   ALPRAZolam  (XANAX ) 0.5 MG tablet Take 1 tablet (0.5 mg total) by mouth 3 (three) times daily as needed for anxiety. 90 tablet 5   Amino Acids (AMINO ACID PO) Take 1 capsule by mouth 2 (two) times daily.     Ascorbic Acid (VITAMIN C) 1000 MG tablet Take 1,000 mg by mouth 2 (two) times daily.     B Complex-Biotin-FA (B COMPLETE) TABS Take 1 tablet by mouth daily. B Complete 100mg      BLACK COHOSH PO Take by mouth as needed.     buPROPion  (WELLBUTRIN  XL) 300 MG 24 hr tablet TAKE 1 TABLET BY MOUTH DAILY 90 tablet 3   Cholecalciferol (VITAMIN D3) 1000 units CAPS Take 1,000 Units by mouth daily.     Coenzyme Q10 (CO Q-10) 200 MG CAPS Take 1 capsule by mouth 2 (two) times daily.     dicyclomine  (BENTYL ) 10 MG capsule TAKE 1 CAPSULE BY MOUTH 3 (THREE) TIMES DAILY BEFORE MEALS. TAKE AS NEEDED FOR IBS FLARES 30 capsule 0   ECHINACEA PO Take 1 tablet by mouth 2 (two) times daily.     ELDERBERRY PO Take by mouth daily.     estradiol  (ESTRACE ) 0.5 MG tablet TAKE 1 TABLET BY MOUTH DAILY 100 tablet 2   Green Tea, Camellia sinensis, POWD Take 1 capsule by mouth daily.     Homeopathic Products (ARNICA [place] ) PLLT Take by mouth daily.     HYDROmorphone  (DILAUDID ) 2 MG tablet 2 mg every 4 (four) hours as needed for severe pain. (Patient not taking: Reported on 02/16/2024)     hydrOXYzine  (VISTARIL ) 25 MG capsule Take 1 capsule (25 mg total) by mouth every 8 (eight) hours as needed for  itching. 30 capsule 3   KRILL OIL PO Take by mouth daily.     LECITHIN PO Take 1 tablet by mouth daily.     levothyroxine  (SYNTHROID ) 75 MCG tablet TAKE 1 TABLET BY MOUTH DAILY IN  THE MORNING 100 tablet 2   lidocaine  (LIDODERM ) 5 % Place 1 patch onto the skin daily. Remove & Discard patch within 12 hours or as directed by MD 90 patch 3   MAGNESIUM  ASPARTATE PO Take 1 tablet by mouth 2 (two) times daily.     meclizine  (ANTIVERT ) 25 MG tablet Take 1 tablet (25 mg total) by mouth 3 (three) times daily as needed for dizziness. 60 tablet 5   Misc. Devices (ROLLATOR) MISC Use rollator for ambulation 1 each 0   Multiple Vitamins-Minerals (ZINC PO) Take by mouth daily.     omeprazole  (PRILOSEC) 40 MG capsule Take 1 capsule (40 mg total) by mouth 2 (two) times daily before a meal. 180 capsule 3   ondansetron  (ZOFRAN ) 4 MG tablet Take 1 tablet (4 mg total) by mouth every 8 (eight) hours as needed. for nausea 90 tablet 0   ondansetron  (ZOFRAN -ODT) 8 MG disintegrating tablet DISSOLVE  1 TABLET ON THE TONGUE EVERY 8 HOURS AS NEEDED FOR NAUSEA OR VOMITTING 90 tablet 0   POTASSIUM AMINOBENZOATE PO Take 1 tablet by mouth daily.     Propylene Glycol (SYSTANE COMPLETE OP) Apply to eye as needed.     TART CHERRY PO Take by mouth daily.     triamterene -hydrochlorothiazide  (MAXZIDE -25) 37.5-25 MG tablet TAKE 1 TABLET BY MOUTH DAILY 100 tablet 2   TURMERIC PO Take 1 tablet by mouth 2 (two) times daily.      vitamin E 400 UNIT capsule Take 400 Units by mouth daily.     No current facility-administered medications on file prior to visit.    Review of Systems     Objective:  There were no vitals filed for this visit. BP Readings from Last 3 Encounters:  02/16/24 (!) 149/87  12/20/23 126/80  09/27/23 126/72   Wt Readings from Last 3 Encounters:  02/16/24 123 lb 9.6 oz (56.1 kg)  12/20/23 124 lb (56.2 kg)  09/27/23 124 lb (56.2 kg)   There is no height or weight on file to calculate BMI.    Physical  Exam         Assessment & Plan:    See Problem List for Assessment and Plan of chronic medical problems.

## 2024-05-09 ENCOUNTER — Encounter: Payer: Self-pay | Admitting: Internal Medicine

## 2024-05-09 ENCOUNTER — Ambulatory Visit: Admitting: Internal Medicine

## 2024-05-09 VITALS — BP 140/80 | HR 97 | Temp 97.9°F | Ht 59.06 in | Wt 124.0 lb

## 2024-05-09 DIAGNOSIS — L299 Pruritus, unspecified: Secondary | ICD-10-CM | POA: Insufficient documentation

## 2024-05-09 DIAGNOSIS — E039 Hypothyroidism, unspecified: Secondary | ICD-10-CM

## 2024-05-09 DIAGNOSIS — L853 Xerosis cutis: Secondary | ICD-10-CM | POA: Insufficient documentation

## 2024-05-09 LAB — COMPREHENSIVE METABOLIC PANEL WITH GFR
ALT: 17 U/L (ref 0–35)
AST: 24 U/L (ref 0–37)
Albumin: 4.5 g/dL (ref 3.5–5.2)
Alkaline Phosphatase: 49 U/L (ref 39–117)
BUN: 22 mg/dL (ref 6–23)
CO2: 29 meq/L (ref 19–32)
Calcium: 10.4 mg/dL (ref 8.4–10.5)
Chloride: 101 meq/L (ref 96–112)
Creatinine, Ser: 0.73 mg/dL (ref 0.40–1.20)
GFR: 76.08 mL/min (ref >60.00)
Glucose, Bld: 94 mg/dL (ref 70–99)
Potassium: 3.6 meq/L (ref 3.5–5.1)
Sodium: 139 meq/L (ref 135–145)
Total Bilirubin: 0.4 mg/dL (ref 0.2–1.2)
Total Protein: 7.8 g/dL (ref 6.0–8.3)

## 2024-05-09 LAB — CBC WITH DIFFERENTIAL/PLATELET
Basophils Absolute: 0 K/uL (ref 0.0–0.1)
Basophils Relative: 0.4 % (ref 0.0–3.0)
Eosinophils Absolute: 0.2 K/uL (ref 0.0–0.7)
Eosinophils Relative: 2.1 % (ref 0.0–5.0)
HCT: 39.1 % (ref 36.0–46.0)
Hemoglobin: 13.2 g/dL (ref 12.0–15.0)
Lymphocytes Relative: 19.9 % (ref 12.0–46.0)
Lymphs Abs: 2.2 K/uL (ref 0.7–4.0)
MCHC: 33.8 g/dL (ref 30.0–36.0)
MCV: 98.5 fl (ref 78.0–100.0)
Monocytes Absolute: 1.1 K/uL — ABNORMAL HIGH (ref 0.1–1.0)
Monocytes Relative: 9.8 % (ref 3.0–12.0)
Neutro Abs: 7.6 K/uL (ref 1.4–7.7)
Neutrophils Relative %: 67.8 % (ref 43.0–77.0)
Platelets: 263 K/uL (ref 150.0–400.0)
RBC: 3.97 Mil/uL (ref 3.87–5.11)
RDW: 14.3 % (ref 11.5–15.5)
WBC: 11.2 K/uL — ABNORMAL HIGH (ref 4.0–10.5)

## 2024-05-09 LAB — TSH: TSH: 1.93 u[IU]/mL (ref 0.35–5.50)

## 2024-05-09 NOTE — Assessment & Plan Note (Signed)
 Acute Started suddenly 3-4 weeks ago without obvious cause Denies any changes in medication, supplements or products She does moisturize on a daily basis-mostly with oils and some lotions Will check CBC, CMP and TSH to make sure there is no other causes Likely her skin is just dry which is causing the itching-recommended trying a different oils, creams to moisturize her skin to see if that helps better Can refer to dermatology if necessary

## 2024-05-09 NOTE — Assessment & Plan Note (Signed)
 Acute Likely related to diffusely dry skin Continue to moisturize skin daily-multiple times a day-May need to try different over-the-counter oils and lotions to see what works best with her skin

## 2024-05-09 NOTE — Patient Instructions (Addendum)
      Blood work was ordered.       Medications changes include :   None

## 2024-05-09 NOTE — Assessment & Plan Note (Addendum)
 Chronic  Having increased dry skin but otherwise clinically euthyroid Continue levothyroxine  75 mcg Check tsh and will titrate medication dose as needed

## 2024-05-11 ENCOUNTER — Ambulatory Visit: Payer: Self-pay | Admitting: Internal Medicine

## 2024-06-03 ENCOUNTER — Encounter: Payer: Self-pay | Admitting: Internal Medicine

## 2024-06-03 NOTE — Patient Instructions (Addendum)
 "     Blood work was ordered.       Medications changes include :   None     Return in about 6 months (around 12/03/2024) for follow up.    Health Maintenance, Female Adopting a healthy lifestyle and getting preventive care are important in promoting health and wellness. Ask your health care provider about: The right schedule for you to have regular tests and exams. Things you can do on your own to prevent diseases and keep yourself healthy. What should I know about diet, weight, and exercise? Eat a healthy diet  Eat a diet that includes plenty of vegetables, fruits, low-fat dairy products, and lean protein. Do not eat a lot of foods that are high in solid fats, added sugars, or sodium. Maintain a healthy weight Body mass index (BMI) is used to identify weight problems. It estimates body fat based on height and weight. Your health care provider can help determine your BMI and help you achieve or maintain a healthy weight. Get regular exercise Get regular exercise. This is one of the most important things you can do for your health. Most adults should: Exercise for at least 150 minutes each week. The exercise should increase your heart rate and make you sweat (moderate-intensity exercise). Do strengthening exercises at least twice a week. This is in addition to the moderate-intensity exercise. Spend less time sitting. Even light physical activity can be beneficial. Watch cholesterol and blood lipids Have your blood tested for lipids and cholesterol at 84 years of age, then have this test every 5 years. Have your cholesterol levels checked more often if: Your lipid or cholesterol levels are high. You are older than 84 years of age. You are at high risk for heart disease. What should I know about cancer screening? Depending on your health history and family history, you may need to have cancer screening at various ages. This may include screening for: Breast cancer. Cervical  cancer. Colorectal cancer. Skin cancer. Lung cancer. What should I know about heart disease, diabetes, and high blood pressure? Blood pressure and heart disease High blood pressure causes heart disease and increases the risk of stroke. This is more likely to develop in people who have high blood pressure readings or are overweight. Have your blood pressure checked: Every 3-5 years if you are 58-19 years of age. Every year if you are 32 years old or older. Diabetes Have regular diabetes screenings. This checks your fasting blood sugar level. Have the screening done: Once every three years after age 24 if you are at a normal weight and have a low risk for diabetes. More often and at a younger age if you are overweight or have a high risk for diabetes. What should I know about preventing infection? Hepatitis B If you have a higher risk for hepatitis B, you should be screened for this virus. Talk with your health care provider to find out if you are at risk for hepatitis B infection. Hepatitis C Testing is recommended for: Everyone born from [birth year] through 1965. Anyone with known risk factors for hepatitis C. Sexually transmitted infections (STIs) Get screened for STIs, including gonorrhea and chlamydia, if: You are sexually active and are younger than 84 years of age. You are older than 84 years of age and your health care provider tells you that you are at risk for this type of infection. Your sexual activity has changed since you were last screened, and you are at increased risk for chlamydia  or gonorrhea. Ask your health care provider if you are at risk. Ask your health care provider about whether you are at high risk for HIV. Your health care provider may recommend a prescription medicine to help prevent HIV infection. If you choose to take medicine to prevent HIV, you should first get tested for HIV. You should then be tested every 3 months for as long as you are taking the  medicine. Pregnancy If you are about to stop having your period (premenopausal) and you may become pregnant, seek counseling before you get pregnant. Take 400 to 800 micrograms (mcg) of folic acid every day if you become pregnant. Ask for birth control (contraception) if you want to prevent pregnancy. Osteoporosis and menopause Osteoporosis is a disease in which the bones lose minerals and strength with aging. This can result in bone fractures. If you are 20 years old or older, or if you are at risk for osteoporosis and fractures, ask your health care provider if you should: Be screened for bone loss. Take a calcium or vitamin D supplement to lower your risk of fractures. Be given hormone replacement therapy (HRT) to treat symptoms of menopause. Follow these instructions at home: Alcohol use Do not drink alcohol if: Your health care provider tells you not to drink. You are pregnant, may be pregnant, or are planning to become pregnant. If you drink alcohol: Limit how much you have to: 0-1 drink a day. Know how much alcohol is in your drink. In the [place] one drink equals one 12 oz bottle of beer (355 mL), one 5 oz glass of wine (148 mL), or one 1 oz glass of hard liquor (44 mL). Lifestyle Do not use any products that contain nicotine or tobacco. These products include cigarettes, chewing tobacco, and vaping devices, such as e-cigarettes. If you need help quitting, ask your health care provider. Do not use street drugs. Do not share needles. Ask your health care provider for help if you need support or information about quitting drugs. General instructions Schedule regular health, dental, and eye exams. Stay current with your vaccines. Tell your health care provider if: You often feel depressed. You have ever been abused or do not feel safe at home. Summary Adopting a healthy lifestyle and getting preventive care are important in promoting health and wellness. Follow your health care  provider's instructions about healthy diet, exercising, and getting tested or screened for diseases. Follow your health care provider's instructions on monitoring your cholesterol and blood pressure. This information is not intended to replace advice given to you by your health care provider. Make sure you discuss any questions you have with your health care provider. Document Revised: 10/07/2020 Document Reviewed: 10/07/2020 Elsevier Patient Education  2024 [employer]. "

## 2024-06-03 NOTE — Progress Notes (Unsigned)
 "   Subjective:    Patient ID: Laura Barnes, female    DOB: 13-Aug-1940, 84 y.o.   MRN: [id number]      HPI Laura Barnes is here for a Physical exam and her chronic medical problems.   Discussed the use of AI scribe software for clinical note transcription with the patient, who gave verbal consent to proceed.  History of Present Illness Laura Barnes Jan is an 84 year old female with fibromyalgia and macular degeneration who presents with worsening pain and vision issues.  She experiences significant pain, particularly in her back and legs, and has a history of fibromyalgia. The pain exacerbates her blood pressure when she leaves the house, though it remains stable at home. Rib pain, described as feeling like being 'smashed with a hammer', is also present.  She has a history of chronic constipation, worsened by omeprazole . She struggles to balance managing heartburn and maintaining regular bowel movements, having tried remedies like cold water  and considering olive oil. The condition remains challenging to manage.  Vision issues include episodes of double vision and blurriness. She has been diagnosed with macular degeneration. Puffiness around her eyes has been noticed in the past two weeks.  She describes episodes of shortness of breath, likened to panic attacks, occurring without pain or pressure. These episodes have been infrequent, with the last notable occurrence before Thanksgiving.  Swelling and tightness in her ankles and feet occur, particularly in the evenings, though they do not appear visibly swollen. She attributes this sensation to nerve issues from her back and possibly fluid retention.  Urinary issues include difficulty initiating urination and a sensation of incomplete emptying, associated with fibromyalgia-related cystitis. She drinks a lot of water  to manage her symptoms.  She has a history of migraines, starting on the left side of her head and sometimes  spreading. The intensity has decreased over time, but pressure and soreness persist.  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 She also mentions small skin nodules that recur after being removed.     Medications and allergies reviewed with patient and updated if appropriate.  Medications Ordered Prior to Encounter[1]  Review of Systems  Constitutional:  Negative for fever.  HENT:  Negative for sinus pressure.   Eyes:  Positive for visual disturbance (double vision at times - related to fibro).       Puffiness under eyes  Respiratory:  Negative for cough, shortness of breath and wheezing.   Cardiovascular:  Positive for leg swelling (gets tight feeling in ankles and feet). Negative for chest pain and palpitations.  Gastrointestinal:  Positive for abdominal pain (constipation) and constipation. Negative for blood in stool and diarrhea.       Freq gerd  Genitourinary:  Negative for dysuria.  Musculoskeletal:  Positive for arthralgias and back pain.  Skin:  Negative for rash.  Neurological:  Positive for headaches (occ).  Psychiatric/Behavioral:  Positive for dysphoric mood. The patient is nervous/anxious.        Objective:   Vitals:   06/05/24 1358  BP: 138/70  Pulse: (!) 105  Temp: 98 F (36.7 C)  SpO2: 96%   Filed Weights   06/05/24 1358  Weight: 126 lb (57.2 kg)   Body mass index is 25.4 kg/m.  BP Readings from Last 3 Encounters:  06/05/24 138/70  05/09/24 (!) 140/80  02/16/24 (!) 149/87    Wt Readings from Last 3 Encounters:  06/05/24 126 lb (57.2 kg)  05/09/24 124 lb (  56.2 kg)  02/16/24 123 lb 9.6 oz (56.1 kg)       Physical Exam Constitutional: She appears well-developed and well-nourished. No distress.  HENT:  Head: Normocephalic and atraumatic.  Right Ear: External ear normal. Normal ear canal and TM Left Ear: External ear normal.  Normal ear canal and TM Mouth/Throat: Oropharynx is clear and moist.  Eyes: Conjunctivae  normal.  Neck: Neck supple. No tracheal deviation present. No thyromegaly present.  No carotid bruit  Cardiovascular: Normal rate, regular rhythm and normal heart sounds.   No murmur heard.  Trace b/l LE edema. Pulmonary/Chest: Effort normal and breath sounds normal. No respiratory distress. She has no wheezes. She has no rales.  Breast: deferred   Abdominal: Soft. She exhibits no distension. There is no tenderness.  Lymphadenopathy: She has no cervical adenopathy.  Skin: Skin is warm and dry. She is not diaphoretic.  Psychiatric: She has a normal mood and affect. Her behavior is normal.     Lab Results  Component Value Date   WBC 11.2 (H) 05/09/2024   HGB 13.2 05/09/2024   HCT 39.1 05/09/2024   PLT 263.0 05/09/2024   GLUCOSE 94 05/09/2024   CHOL 196 03/29/2023   TRIG 145.0 03/29/2023   HDL 80.40 03/29/2023   LDLDIRECT 119.6 04/06/2013   LDLCALC 87 03/29/2023   ALT 17 05/09/2024   AST 24 05/09/2024   NA 139 05/09/2024   K 3.6 05/09/2024   CL 101 05/09/2024   CREATININE 0.73 05/09/2024   BUN 22 05/09/2024   CO2 29 05/09/2024   TSH 1.93 05/09/2024   HGBA1C 5.3 09/29/2023         Assessment & Plan:   Physical exam: Screening blood work  ordered Exercise  dose some exercises  Weight  normal Substance abuse  none   Reviewed recommended immunizations.   Health Maintenance  Topic Date Due   Medicare Annual Wellness (AWV)  10/19/2023   Influenza Vaccine  08/29/2024 (Originally 12/31/2023)   Bone Density Scan  03/30/2025   DTaP/Tdap/Td (3 - Td or Tdap) 03/23/2029   Pneumococcal Vaccine: 50+ Years  Completed   Zoster Vaccines- Shingrix  Completed   Meningococcal B Vaccine  Aged Out   COVID-19 Vaccine  Discontinued   Hepatitis C Screening  Discontinued          See Problem List for Assessment and Plan of chronic medical problems.         [1]  Current Outpatient Medications on File Prior to Visit  Medication Sig Dispense Refill   acetaminophen   (TYLENOL ) 500 MG tablet Take 2 tablets (1,000 mg total) by mouth every 6 (six) hours as needed for mild pain, moderate pain, fever or headache. 30 tablet 0   ALPRAZolam  (XANAX ) 0.5 MG tablet Take 1 tablet (0.5 mg total) by mouth 3 (three) times daily as needed for anxiety. 90 tablet 5   Amino Acids (AMINO ACID PO) Take 1 capsule by mouth 2 (two) times daily.     Ascorbic Acid (VITAMIN C) 1000 MG tablet Take 1,000 mg by mouth 2 (two) times daily.     B Complex-Biotin-FA (B COMPLETE) TABS Take 1 tablet by mouth daily. B Complete 100mg      BLACK COHOSH PO Take by mouth as needed.     buPROPion  (WELLBUTRIN  XL) 300 MG 24 hr tablet TAKE 1 TABLET BY MOUTH DAILY 90 tablet 3   Cholecalciferol (VITAMIN D3) 1000 units CAPS Take 1,000 Units by mouth daily.     Coenzyme Q10 (CO  Q-10) 200 MG CAPS Take 1 capsule by mouth 2 (two) times daily.     dicyclomine  (BENTYL ) 10 MG capsule TAKE 1 CAPSULE BY MOUTH 3 (THREE) TIMES DAILY BEFORE MEALS. TAKE AS NEEDED FOR IBS FLARES 30 capsule 0   ECHINACEA PO Take 1 tablet by mouth 2 (two) times daily.     ELDERBERRY PO Take by mouth daily.     Green Tea, Camellia sinensis, POWD Take 1 capsule by mouth daily.     Homeopathic Products (ARNICA [place] ) PLLT Take by mouth daily.     HYDROmorphone  (DILAUDID ) 2 MG tablet 2 mg every 4 (four) hours as needed for severe pain.     hydrOXYzine  (VISTARIL ) 25 MG capsule Take 1 capsule (25 mg total) by mouth every 8 (eight) hours as needed for itching. 30 capsule 3   KRILL OIL PO Take by mouth daily.     LECITHIN PO Take 1 tablet by mouth daily.     lidocaine  (LIDODERM ) 5 % Place 1 patch onto the skin daily. Remove & Discard patch within 12 hours or as directed by MD 90 patch 3   MAGNESIUM  ASPARTATE PO Take 1 tablet by mouth 2 (two) times daily.     meclizine  (ANTIVERT ) 25 MG tablet Take 1 tablet (25 mg total) by mouth 3 (three) times daily as needed for dizziness. 60 tablet 5   Multiple Vitamins-Minerals (ZINC PO) Take by mouth daily.      omeprazole  (PRILOSEC) 40 MG capsule Take 1 capsule (40 mg total) by mouth 2 (two) times daily before a meal. 180 capsule 3   ondansetron  (ZOFRAN ) 4 MG tablet Take 1 tablet (4 mg total) by mouth every 8 (eight) hours as needed. for nausea 90 tablet 0   ondansetron  (ZOFRAN -ODT) 8 MG disintegrating tablet DISSOLVE 1 TABLET ON THE TONGUE EVERY 8 HOURS AS NEEDED FOR NAUSEA OR VOMITTING 90 tablet 0   POTASSIUM AMINOBENZOATE PO Take 1 tablet by mouth daily.     Propylene Glycol (SYSTANE COMPLETE OP) Apply to eye as needed.     TART CHERRY PO Take by mouth daily.     TURMERIC PO Take 1 tablet by mouth 2 (two) times daily.      vitamin E 400 UNIT capsule Take 400 Units by mouth daily.     No current facility-administered medications on file prior to visit.   "

## 2024-06-05 ENCOUNTER — Ambulatory Visit: Admitting: Internal Medicine

## 2024-06-05 VITALS — BP 138/70 | HR 105 | Temp 98.0°F | Ht 59.06 in | Wt 126.0 lb

## 2024-06-05 DIAGNOSIS — Z79890 Hormone replacement therapy (postmenopausal): Secondary | ICD-10-CM

## 2024-06-05 DIAGNOSIS — E039 Hypothyroidism, unspecified: Secondary | ICD-10-CM | POA: Diagnosis not present

## 2024-06-05 DIAGNOSIS — M797 Fibromyalgia: Secondary | ICD-10-CM

## 2024-06-05 DIAGNOSIS — E7800 Pure hypercholesterolemia, unspecified: Secondary | ICD-10-CM | POA: Diagnosis not present

## 2024-06-05 DIAGNOSIS — Z0000 Encounter for general adult medical examination without abnormal findings: Secondary | ICD-10-CM

## 2024-06-05 DIAGNOSIS — H3530 Unspecified macular degeneration: Secondary | ICD-10-CM | POA: Diagnosis not present

## 2024-06-05 DIAGNOSIS — F419 Anxiety disorder, unspecified: Secondary | ICD-10-CM

## 2024-06-05 DIAGNOSIS — F3289 Other specified depressive episodes: Secondary | ICD-10-CM

## 2024-06-05 DIAGNOSIS — R739 Hyperglycemia, unspecified: Secondary | ICD-10-CM

## 2024-06-05 DIAGNOSIS — K219 Gastro-esophageal reflux disease without esophagitis: Secondary | ICD-10-CM | POA: Diagnosis not present

## 2024-06-05 DIAGNOSIS — R600 Localized edema: Secondary | ICD-10-CM | POA: Diagnosis not present

## 2024-06-05 DIAGNOSIS — I2510 Atherosclerotic heart disease of native coronary artery without angina pectoris: Secondary | ICD-10-CM | POA: Diagnosis not present

## 2024-06-05 DIAGNOSIS — R110 Nausea: Secondary | ICD-10-CM | POA: Diagnosis not present

## 2024-06-05 MED ORDER — ESTRADIOL 0.5 MG PO TABS: 0.5000 mg | ORAL_TABLET | Freq: Every day | ORAL | 2 refills | Status: AC

## 2024-06-05 MED ORDER — TRIAMTERENE-HCTZ 37.5-25 MG PO TABS: 1.0000 | ORAL_TABLET | Freq: Every day | ORAL | 2 refills | Status: AC

## 2024-06-05 MED ORDER — LEVOTHYROXINE SODIUM 75 MCG PO TABS: 75.0000 ug | ORAL_TABLET | Freq: Every morning | ORAL | 2 refills | Status: AC

## 2024-06-05 NOTE — Assessment & Plan Note (Signed)
 Chronic Will check lipids Lifestyle controlled Regular exercise and healthy diet encouraged

## 2024-06-05 NOTE — Assessment & Plan Note (Signed)
 Chronic Controlled, stable Continue wellbutrin  xl 300 mg daily Occ takes an extra 100-150 xl of wellbutrin  from an old prescription which does help - ok to continue

## 2024-06-05 NOTE — Assessment & Plan Note (Signed)
 Chronic Intermittent nausea Likely related to GERD and constipation Continue zofran  prn - has two different doses for severity of nausea

## 2024-06-05 NOTE — Assessment & Plan Note (Signed)
 Chronic Eye exams up-to-date

## 2024-06-05 NOTE — Assessment & Plan Note (Signed)
 Chronic Frequent GERD Omeprazole  causes constipation Try pepcid, try natural remedies Does not want to try any new medication or see GI

## 2024-06-05 NOTE — Assessment & Plan Note (Signed)
 Chronic Controlled, stable Continue welbutrin xl 300 mg daily, xanax  0.5 mg tid prn Occ takes an extra 100-150 xl of wellbutrin  from an old prescription which does help - ok to continue

## 2024-06-05 NOTE — Assessment & Plan Note (Signed)
 Chronic Fairly stable Having a flare now - likely related to increased stress Continue wellbutrin  xl 300 mg daily, estradiol  0.5 mg daily Tylenol  as needed Continue natural supplements

## 2024-06-05 NOTE — Assessment & Plan Note (Signed)
 Chronic Lab Results  Component Value Date   HGBA1C 5.3 09/29/2023   Check a1c Low sugar / carb diet Stressed regular exercise

## 2024-06-05 NOTE — Assessment & Plan Note (Signed)
Chronic Mild, non-obstructive No angina like symptoms Statin intolerant

## 2024-06-05 NOTE — Assessment & Plan Note (Signed)
 Chronic  Clinically euthyroid Continue levothyroxine  75 mcg Check tsh and will titrate medication dose as needed

## 2024-06-05 NOTE — Assessment & Plan Note (Signed)
 Chronic Continue estradiol  0.5 mg daily Aware of risks -- she feels benefits outweigh risks

## 2024-06-05 NOTE — Assessment & Plan Note (Signed)
Chronic ?Controlled, stable ?Continue triamterene-hct 37.5-25 mg daily ?

## 2024-06-14 ENCOUNTER — Encounter: Payer: Self-pay | Admitting: Internal Medicine

## 2024-06-20 ENCOUNTER — Encounter: Payer: Self-pay | Admitting: Internal Medicine

## 2024-06-20 ENCOUNTER — Ambulatory Visit: Admitting: Internal Medicine

## 2024-06-20 ENCOUNTER — Telehealth: Payer: Self-pay

## 2024-06-20 VITALS — BP 134/78 | HR 65 | Temp 98.0°F | Ht 59.06 in | Wt 123.0 lb

## 2024-06-20 DIAGNOSIS — L299 Pruritus, unspecified: Secondary | ICD-10-CM

## 2024-06-20 DIAGNOSIS — R239 Unspecified skin changes: Secondary | ICD-10-CM

## 2024-06-20 DIAGNOSIS — L853 Xerosis cutis: Secondary | ICD-10-CM | POA: Diagnosis not present

## 2024-06-20 DIAGNOSIS — L989 Disorder of the skin and subcutaneous tissue, unspecified: Secondary | ICD-10-CM

## 2024-06-20 DIAGNOSIS — R21 Rash and other nonspecific skin eruption: Secondary | ICD-10-CM | POA: Diagnosis not present

## 2024-06-20 NOTE — Progress Notes (Signed)
 "   Subjective:    Patient ID: Laura Barnes, female    DOB: 06-05-1940, 84 y.o.   MRN: [id number]      HPI Laura Barnes is here for  Chief Complaint  Patient presents with   Rash    On the back of right shoulder blade; rash is itchy and Laura Barnes at times    Discussed the use of AI scribe software for clinical note transcription with the patient, who gave verbal consent to proceed.  History of Present Illness Laura Barnes is an 84 year old female who presents with worsening skin lesions on her back and shoulder.  She describes a skin issue on her shoulder that began as itching with small bumps, which has since expanded and become sore and irritating. The area is difficult to reach, making self-care challenging. She attempted to use tea tree oil but was unable to apply it effectively.  She has a history of similar skin issues, including bumps on her back that feel like acne under the skin. She recalls having acne-like bumps when she had long hair, which resolved after cutting her hair. She is concerned about the possibility of skin deterioration with age, noting increased flakiness and thin skin.  She mentions a long-standing skin lesion on her face that was previously removed by a dermatologist but has since returned and occasionally becomes flaky. She is concerned about the cosmetic impact of potential treatments for this lesion.  Additional skin symptoms include itchy spots and bumps on her arms and legs, and new bumps on her breast tissue within the last six months. These symptoms are exacerbated by scratching and resemble 'poison ivy' or shingles, although she has not experienced a typical shingles rash.  She manages her skin dryness with baby oil, cocoa butter, shea butter, and coconut oil, which she applies to her entire body. These treatments help with overall skin hydration but require daily application to be effective.  She inquires about the potential connection between  her skin issues and fibromyalgia, as she has read about similar symptoms in a fibromyalgia group. She also mentions a history of fibromyalgia, which occasionally causes significant discomfort.       Medications and allergies reviewed with patient and updated if appropriate.  Medications Ordered Prior to Encounter[1]  Review of Systems     Objective:   Vitals:   06/20/24 1437  BP: 134/78  Pulse: 65  Temp: 98 F (36.7 C)  SpO2: 94%   BP Readings from Last 3 Encounters:  06/20/24 134/78  06/05/24 138/70  05/09/24 (!) 140/80   Wt Readings from Last 3 Encounters:  06/20/24 123 lb (55.8 kg)  06/05/24 126 lb (57.2 kg)  05/09/24 124 lb (56.2 kg)   Body mass index is 24.79 kg/m.    Physical Exam Constitutional:      General: She is not in acute distress.    Appearance: Normal appearance. She is not ill-appearing.  HENT:     Head: Normocephalic and atraumatic.  Skin:    General: Skin is warm and dry.     Findings: Rash (Rash on right upper back with a few erythematous papules and macules, 1 which is excoriated from her scratching.  No generalized invasive erythema.  No blisters.  No open wounds) present.     Comments: Several scabbed papules, erythematous patches throughout different areas of her body.  Others areas of rough skin  Neurological:     Mental Status: She is alert.  Assessment & Plan:    See Problem List for Assessment and Plan of chronic medical problems.   Assessment and Plan Assessment & Plan Chronic pruritic and xerotic skin changes Differential includes dryness, precancerous lesions, and nonspecific rashes.  I am unsure if there is a relation to fibromyalgia symptoms. Dermatology evaluation needed for definitive diagnosis. - Referred to dermatology for comprehensive skin evaluation. - Continue skin hydration with lotions and oils. - Consider low-dose steroid for itchy lesions if needed.  Rash right upper back Nonspecific  appearing.  Discussed could have been a mild case of shingles versus dry skin versus nonspecific rash - Discussed symptomatic treatment - Dermatology evaluation  Recurrent facial skin lesion Recurrent lesion, previously removed, possibly precancerous.  - Referred to dermatology for evaluation of recurrent facial lesion.         [1]  Current Outpatient Medications on File Prior to Visit  Medication Sig Dispense Refill   acetaminophen  (TYLENOL ) 500 MG tablet Take 2 tablets (1,000 mg total) by mouth every 6 (six) hours as needed for mild pain, moderate pain, fever or headache. 30 tablet 0   ALPRAZolam  (XANAX ) 0.5 MG tablet Take 1 tablet (0.5 mg total) by mouth 3 (three) times daily as needed for anxiety. 90 tablet 5   Amino Acids (AMINO ACID PO) Take 1 capsule by mouth 2 (two) times daily.     Ascorbic Acid (VITAMIN C) 1000 MG tablet Take 1,000 mg by mouth 2 (two) times daily.     B Complex-Biotin-FA (B COMPLETE) TABS Take 1 tablet by mouth daily. B Complete 100mg      BLACK COHOSH PO Take by mouth as needed.     buPROPion  (WELLBUTRIN  XL) 300 MG 24 hr tablet TAKE 1 TABLET BY MOUTH DAILY 90 tablet 3   Cholecalciferol (VITAMIN D3) 1000 units CAPS Take 1,000 Units by mouth daily.     Coenzyme Q10 (CO Q-10) 200 MG CAPS Take 1 capsule by mouth 2 (two) times daily.     dicyclomine  (BENTYL ) 10 MG capsule TAKE 1 CAPSULE BY MOUTH 3 (THREE) TIMES DAILY BEFORE MEALS. TAKE AS NEEDED FOR IBS FLARES 30 capsule 0   ECHINACEA PO Take 1 tablet by mouth 2 (two) times daily.     ELDERBERRY PO Take by mouth daily.     estradiol  (ESTRACE ) 0.5 MG tablet Take 1 tablet (0.5 mg total) by mouth daily. 100 tablet 2   Green Tea, Camellia sinensis, POWD Take 1 capsule by mouth daily.     Homeopathic Products (ARNICA [place] ) PLLT Take by mouth daily.     HYDROmorphone  (DILAUDID ) 2 MG tablet 2 mg every 4 (four) hours as needed for severe pain.     hydrOXYzine  (VISTARIL ) 25 MG capsule Take 1 capsule (25 mg total) by  mouth every 8 (eight) hours as needed for itching. 30 capsule 3   KRILL OIL PO Take by mouth daily.     LECITHIN PO Take 1 tablet by mouth daily.     levothyroxine  (SYNTHROID ) 75 MCG tablet Take 1 tablet (75 mcg total) by mouth every morning. 100 tablet 2   lidocaine  (LIDODERM ) 5 % Place 1 patch onto the skin daily. Remove & Discard patch within 12 hours or as directed by MD 90 patch 3   MAGNESIUM  ASPARTATE PO Take 1 tablet by mouth 2 (two) times daily.     meclizine  (ANTIVERT ) 25 MG tablet Take 1 tablet (25 mg total) by mouth 3 (three) times daily as needed for dizziness. 60 tablet 5  Multiple Vitamins-Minerals (ZINC PO) Take by mouth daily.     omeprazole  (PRILOSEC) 40 MG capsule Take 1 capsule (40 mg total) by mouth 2 (two) times daily before a meal. 180 capsule 3   ondansetron  (ZOFRAN ) 4 MG tablet Take 1 tablet (4 mg total) by mouth every 8 (eight) hours as needed. for nausea 90 tablet 0   ondansetron  (ZOFRAN -ODT) 8 MG disintegrating tablet DISSOLVE 1 TABLET ON THE TONGUE EVERY 8 HOURS AS NEEDED FOR NAUSEA OR VOMITTING 90 tablet 0   POTASSIUM AMINOBENZOATE PO Take 1 tablet by mouth daily.     Propylene Glycol (SYSTANE COMPLETE OP) Apply to eye as needed.     TART CHERRY PO Take by mouth daily.     triamterene -hydrochlorothiazide  (MAXZIDE -25) 37.5-25 MG tablet Take 1 tablet by mouth daily. 100 tablet 2   TURMERIC PO Take 1 tablet by mouth 2 (two) times daily.      vitamin E 400 UNIT capsule Take 400 Units by mouth daily.     No current facility-administered medications on file prior to visit.   "

## 2024-06-20 NOTE — Patient Instructions (Addendum)
" ° ° ° ° °  Medications changes include :   None    A referral was ordered Dermatology someone will call you to schedule an appointment.    "

## 2024-06-22 ENCOUNTER — Other Ambulatory Visit: Payer: Self-pay

## 2024-06-22 DIAGNOSIS — J309 Allergic rhinitis, unspecified: Secondary | ICD-10-CM

## 2024-06-22 DIAGNOSIS — I2510 Atherosclerotic heart disease of native coronary artery without angina pectoris: Secondary | ICD-10-CM

## 2024-06-22 DIAGNOSIS — M47816 Spondylosis without myelopathy or radiculopathy, lumbar region: Secondary | ICD-10-CM

## 2024-06-22 DIAGNOSIS — M5416 Radiculopathy, lumbar region: Secondary | ICD-10-CM

## 2024-06-22 DIAGNOSIS — M5030 Other cervical disc degeneration, unspecified cervical region: Secondary | ICD-10-CM

## 2024-06-22 DIAGNOSIS — G8929 Other chronic pain: Secondary | ICD-10-CM

## 2024-06-22 DIAGNOSIS — M797 Fibromyalgia: Secondary | ICD-10-CM

## 2024-06-22 DIAGNOSIS — M542 Cervicalgia: Secondary | ICD-10-CM

## 2024-06-22 DIAGNOSIS — H3530 Unspecified macular degeneration: Secondary | ICD-10-CM

## 2024-06-22 DIAGNOSIS — R053 Chronic cough: Secondary | ICD-10-CM

## 2024-06-22 NOTE — Telephone Encounter (Signed)
"  Referrals sent in today  "

## 2024-06-27 ENCOUNTER — Encounter: Admitting: Internal Medicine

## 2024-08-01 ENCOUNTER — Ambulatory Visit: Admitting: Internal Medicine

## 2024-08-09 ENCOUNTER — Ambulatory Visit: Admitting: Family Medicine

## 2024-12-06 ENCOUNTER — Ambulatory Visit: Admitting: Internal Medicine

## 2024-12-22 ENCOUNTER — Ambulatory Visit: Admitting: Internal Medicine

## 2025-02-15 ENCOUNTER — Ambulatory Visit: Admitting: Rheumatology
